# Patient Record
Sex: Female | Born: 1960 | Race: Black or African American | Hispanic: No | Marital: Married | Smoking: Never smoker
Health system: Southern US, Community
[De-identification: ages and names within clinical notes are randomized; demographics above are authoritative.]

## PROBLEM LIST (undated history)

## (undated) DIAGNOSIS — E669 Obesity, unspecified: Secondary | ICD-10-CM

## (undated) DIAGNOSIS — E785 Hyperlipidemia, unspecified: Secondary | ICD-10-CM

## (undated) DIAGNOSIS — Z8709 Personal history of other diseases of the respiratory system: Secondary | ICD-10-CM

## (undated) DIAGNOSIS — Z9889 Other specified postprocedural states: Secondary | ICD-10-CM

## (undated) DIAGNOSIS — R7303 Prediabetes: Secondary | ICD-10-CM

## (undated) DIAGNOSIS — K5900 Constipation, unspecified: Secondary | ICD-10-CM

## (undated) DIAGNOSIS — M2550 Pain in unspecified joint: Secondary | ICD-10-CM

## (undated) DIAGNOSIS — G4730 Sleep apnea, unspecified: Secondary | ICD-10-CM

## (undated) DIAGNOSIS — R112 Nausea with vomiting, unspecified: Secondary | ICD-10-CM

## (undated) DIAGNOSIS — M1990 Unspecified osteoarthritis, unspecified site: Secondary | ICD-10-CM

## (undated) DIAGNOSIS — M7989 Other specified soft tissue disorders: Secondary | ICD-10-CM

## (undated) DIAGNOSIS — Z860100 Personal history of colon polyps, unspecified: Secondary | ICD-10-CM

## (undated) DIAGNOSIS — T7840XA Allergy, unspecified, initial encounter: Secondary | ICD-10-CM

## (undated) DIAGNOSIS — M2540 Effusion, unspecified joint: Secondary | ICD-10-CM

## (undated) DIAGNOSIS — Z87442 Personal history of urinary calculi: Secondary | ICD-10-CM

## (undated) DIAGNOSIS — G479 Sleep disorder, unspecified: Secondary | ICD-10-CM

## (undated) DIAGNOSIS — J189 Pneumonia, unspecified organism: Secondary | ICD-10-CM

## (undated) DIAGNOSIS — K219 Gastro-esophageal reflux disease without esophagitis: Secondary | ICD-10-CM

## (undated) DIAGNOSIS — Z8719 Personal history of other diseases of the digestive system: Secondary | ICD-10-CM

## (undated) DIAGNOSIS — Z8619 Personal history of other infectious and parasitic diseases: Secondary | ICD-10-CM

## (undated) DIAGNOSIS — R062 Wheezing: Secondary | ICD-10-CM

## (undated) DIAGNOSIS — I10 Essential (primary) hypertension: Secondary | ICD-10-CM

## (undated) DIAGNOSIS — K760 Fatty (change of) liver, not elsewhere classified: Secondary | ICD-10-CM

## (undated) DIAGNOSIS — R0602 Shortness of breath: Secondary | ICD-10-CM

## (undated) DIAGNOSIS — Z91018 Allergy to other foods: Secondary | ICD-10-CM

## (undated) DIAGNOSIS — M549 Dorsalgia, unspecified: Secondary | ICD-10-CM

## (undated) HISTORY — DX: Wheezing: R06.2

## (undated) HISTORY — DX: Obesity, unspecified: E66.9

## (undated) HISTORY — PX: TUBAL LIGATION: SHX77

## (undated) HISTORY — DX: Essential (primary) hypertension: I10

## (undated) HISTORY — PX: OTHER SURGICAL HISTORY: SHX169

## (undated) HISTORY — DX: Other specified soft tissue disorders: M79.89

## (undated) HISTORY — PX: ESOPHAGOGASTRODUODENOSCOPY: SHX1529

## (undated) HISTORY — DX: Sleep disorder, unspecified: G47.9

## (undated) HISTORY — DX: Allergy, unspecified, initial encounter: T78.40XA

## (undated) HISTORY — DX: Fatty (change of) liver, not elsewhere classified: K76.0

## (undated) HISTORY — DX: Allergy to other foods: Z91.018

## (undated) HISTORY — DX: Dorsalgia, unspecified: M54.9

## (undated) HISTORY — DX: Prediabetes: R73.03

## (undated) HISTORY — PX: KNEE SURGERY: SHX244

## (undated) HISTORY — PX: BREAST EXCISIONAL BIOPSY: SUR124

## (undated) HISTORY — DX: Gastro-esophageal reflux disease without esophagitis: K21.9

## (undated) HISTORY — DX: Shortness of breath: R06.02

## (undated) HISTORY — DX: Hyperlipidemia, unspecified: E78.5

## (undated) HISTORY — PX: JOINT REPLACEMENT: SHX530

## (undated) HISTORY — PX: CARDIAC CATHETERIZATION: SHX172

## (undated) HISTORY — PX: BREAST SURGERY: SHX581

## (undated) HISTORY — DX: Unspecified osteoarthritis, unspecified site: M19.90

## (undated) HISTORY — PX: COLONOSCOPY: SHX174

## (undated) HISTORY — DX: Constipation, unspecified: K59.00

## (undated) HISTORY — DX: Sleep apnea, unspecified: G47.30

---

## 1997-12-15 ENCOUNTER — Ambulatory Visit (HOSPITAL_COMMUNITY): Admission: RE | Admit: 1997-12-15 | Discharge: 1997-12-15 | Payer: Self-pay | Admitting: Family Medicine

## 1999-01-27 ENCOUNTER — Encounter: Payer: Self-pay | Admitting: Emergency Medicine

## 1999-01-27 ENCOUNTER — Emergency Department (HOSPITAL_COMMUNITY): Admission: EM | Admit: 1999-01-27 | Discharge: 1999-01-27 | Payer: Self-pay | Admitting: Emergency Medicine

## 2001-05-10 ENCOUNTER — Ambulatory Visit: Admission: RE | Admit: 2001-05-10 | Discharge: 2001-05-10 | Payer: Self-pay | Admitting: Pulmonary Disease

## 2001-05-18 ENCOUNTER — Encounter: Payer: Self-pay | Admitting: Pulmonary Disease

## 2001-05-18 ENCOUNTER — Ambulatory Visit (HOSPITAL_COMMUNITY): Admission: RE | Admit: 2001-05-18 | Discharge: 2001-05-18 | Payer: Self-pay | Admitting: Pulmonary Disease

## 2001-06-08 ENCOUNTER — Ambulatory Visit (HOSPITAL_BASED_OUTPATIENT_CLINIC_OR_DEPARTMENT_OTHER): Admission: RE | Admit: 2001-06-08 | Discharge: 2001-06-08 | Payer: Self-pay | Admitting: Pulmonary Disease

## 2001-11-09 ENCOUNTER — Emergency Department (HOSPITAL_COMMUNITY): Admission: EM | Admit: 2001-11-09 | Discharge: 2001-11-09 | Payer: Self-pay | Admitting: Emergency Medicine

## 2003-01-17 ENCOUNTER — Encounter: Payer: Self-pay | Admitting: Obstetrics and Gynecology

## 2003-01-17 ENCOUNTER — Encounter: Admission: RE | Admit: 2003-01-17 | Discharge: 2003-01-17 | Payer: Self-pay | Admitting: Obstetrics and Gynecology

## 2003-01-28 ENCOUNTER — Other Ambulatory Visit: Admission: RE | Admit: 2003-01-28 | Discharge: 2003-01-28 | Payer: Self-pay | Admitting: Obstetrics and Gynecology

## 2004-01-26 ENCOUNTER — Encounter: Admission: RE | Admit: 2004-01-26 | Discharge: 2004-01-26 | Payer: Self-pay | Admitting: Obstetrics and Gynecology

## 2004-02-02 ENCOUNTER — Encounter: Payer: Self-pay | Admitting: Family Medicine

## 2004-02-17 ENCOUNTER — Other Ambulatory Visit: Admission: RE | Admit: 2004-02-17 | Discharge: 2004-02-17 | Payer: Self-pay | Admitting: Obstetrics and Gynecology

## 2004-05-17 ENCOUNTER — Ambulatory Visit: Payer: Self-pay | Admitting: Family Medicine

## 2005-01-11 ENCOUNTER — Ambulatory Visit: Payer: Self-pay | Admitting: Family Medicine

## 2005-01-25 ENCOUNTER — Ambulatory Visit: Payer: Self-pay | Admitting: Family Medicine

## 2005-02-01 ENCOUNTER — Encounter: Admission: RE | Admit: 2005-02-01 | Discharge: 2005-02-01 | Payer: Self-pay | Admitting: Family Medicine

## 2005-04-04 ENCOUNTER — Other Ambulatory Visit: Admission: RE | Admit: 2005-04-04 | Discharge: 2005-04-04 | Payer: Self-pay | Admitting: Obstetrics and Gynecology

## 2005-04-28 ENCOUNTER — Ambulatory Visit: Payer: Self-pay | Admitting: Family Medicine

## 2005-05-31 ENCOUNTER — Ambulatory Visit: Payer: Self-pay | Admitting: Family Medicine

## 2005-10-27 ENCOUNTER — Ambulatory Visit: Payer: Self-pay | Admitting: Internal Medicine

## 2005-11-17 ENCOUNTER — Ambulatory Visit: Payer: Self-pay | Admitting: Family Medicine

## 2006-02-23 ENCOUNTER — Encounter: Admission: RE | Admit: 2006-02-23 | Discharge: 2006-02-23 | Payer: Self-pay | Admitting: Family Medicine

## 2006-04-10 ENCOUNTER — Ambulatory Visit: Payer: Self-pay | Admitting: Family Medicine

## 2006-04-11 ENCOUNTER — Other Ambulatory Visit: Admission: RE | Admit: 2006-04-11 | Discharge: 2006-04-11 | Payer: Self-pay | Admitting: Obstetrics and Gynecology

## 2006-09-08 ENCOUNTER — Ambulatory Visit: Payer: Self-pay | Admitting: Family Medicine

## 2006-09-08 LAB — CONVERTED CEMR LAB
ALT: 37 units/L (ref 0–40)
Alkaline Phosphatase: 126 units/L — ABNORMAL HIGH (ref 39–117)
Basophils Relative: 0.4 % (ref 0.0–1.0)
Eosinophils Relative: 1.8 % (ref 0.0–5.0)
GFR calc Af Amer: 117 mL/min
Glucose, Bld: 98 mg/dL (ref 70–99)
HCT: 37.3 % (ref 36.0–46.0)
Lymphocytes Relative: 38.2 % (ref 12.0–46.0)
Neutro Abs: 3.2 10*3/uL (ref 1.4–7.7)
Neutrophils Relative %: 47.6 % (ref 43.0–77.0)
Platelets: 341 10*3/uL (ref 150–400)
Potassium: 4 meq/L (ref 3.5–5.1)
RBC: 4.24 M/uL (ref 3.87–5.11)
Sodium: 139 meq/L (ref 135–145)
Total Bilirubin: 0.6 mg/dL (ref 0.3–1.2)
Total Protein: 6.9 g/dL (ref 6.0–8.3)
Triglycerides: 61 mg/dL (ref 0–149)
VLDL: 12 mg/dL (ref 0–40)
WBC: 6.6 10*3/uL (ref 4.5–10.5)

## 2006-10-13 ENCOUNTER — Ambulatory Visit: Payer: Self-pay | Admitting: Family Medicine

## 2007-03-08 ENCOUNTER — Encounter: Admission: RE | Admit: 2007-03-08 | Discharge: 2007-03-08 | Payer: Self-pay | Admitting: Obstetrics and Gynecology

## 2007-03-19 ENCOUNTER — Ambulatory Visit: Payer: Self-pay | Admitting: Family Medicine

## 2007-03-19 DIAGNOSIS — I10 Essential (primary) hypertension: Secondary | ICD-10-CM | POA: Insufficient documentation

## 2007-03-19 DIAGNOSIS — IMO0001 Reserved for inherently not codable concepts without codable children: Secondary | ICD-10-CM | POA: Insufficient documentation

## 2007-03-19 DIAGNOSIS — K219 Gastro-esophageal reflux disease without esophagitis: Secondary | ICD-10-CM | POA: Insufficient documentation

## 2007-03-19 DIAGNOSIS — O99810 Abnormal glucose complicating pregnancy: Secondary | ICD-10-CM | POA: Insufficient documentation

## 2007-03-19 DIAGNOSIS — J302 Other seasonal allergic rhinitis: Secondary | ICD-10-CM | POA: Insufficient documentation

## 2007-03-19 DIAGNOSIS — J4520 Mild intermittent asthma, uncomplicated: Secondary | ICD-10-CM | POA: Insufficient documentation

## 2007-03-19 DIAGNOSIS — J4540 Moderate persistent asthma, uncomplicated: Secondary | ICD-10-CM | POA: Insufficient documentation

## 2007-03-19 DIAGNOSIS — K449 Diaphragmatic hernia without obstruction or gangrene: Secondary | ICD-10-CM | POA: Insufficient documentation

## 2007-03-19 DIAGNOSIS — J3089 Other allergic rhinitis: Secondary | ICD-10-CM

## 2007-03-21 ENCOUNTER — Telehealth (INDEPENDENT_AMBULATORY_CARE_PROVIDER_SITE_OTHER): Payer: Self-pay | Admitting: *Deleted

## 2007-03-27 ENCOUNTER — Telehealth (INDEPENDENT_AMBULATORY_CARE_PROVIDER_SITE_OTHER): Payer: Self-pay | Admitting: *Deleted

## 2007-03-27 ENCOUNTER — Ambulatory Visit: Payer: Self-pay | Admitting: Family Medicine

## 2007-03-27 DIAGNOSIS — T783XXA Angioneurotic edema, initial encounter: Secondary | ICD-10-CM | POA: Insufficient documentation

## 2007-03-28 ENCOUNTER — Telehealth: Payer: Self-pay | Admitting: Family Medicine

## 2007-04-16 ENCOUNTER — Other Ambulatory Visit: Admission: RE | Admit: 2007-04-16 | Discharge: 2007-04-16 | Payer: Self-pay | Admitting: Obstetrics and Gynecology

## 2007-04-30 ENCOUNTER — Telehealth: Payer: Self-pay | Admitting: Family Medicine

## 2007-08-20 ENCOUNTER — Telehealth: Payer: Self-pay | Admitting: Family Medicine

## 2007-08-20 ENCOUNTER — Ambulatory Visit: Payer: Self-pay | Admitting: Family Medicine

## 2007-08-29 ENCOUNTER — Telehealth: Payer: Self-pay | Admitting: Family Medicine

## 2007-09-04 ENCOUNTER — Ambulatory Visit: Payer: Self-pay | Admitting: Family Medicine

## 2007-09-05 ENCOUNTER — Ambulatory Visit: Payer: Self-pay | Admitting: Cardiology

## 2008-01-20 ENCOUNTER — Emergency Department (HOSPITAL_COMMUNITY): Admission: EM | Admit: 2008-01-20 | Discharge: 2008-01-20 | Payer: Self-pay | Admitting: Family Medicine

## 2008-03-21 ENCOUNTER — Encounter: Admission: RE | Admit: 2008-03-21 | Discharge: 2008-03-21 | Payer: Self-pay | Admitting: Obstetrics and Gynecology

## 2008-04-09 ENCOUNTER — Ambulatory Visit: Payer: Self-pay | Admitting: Family Medicine

## 2008-04-17 ENCOUNTER — Other Ambulatory Visit: Admission: RE | Admit: 2008-04-17 | Discharge: 2008-04-17 | Payer: Self-pay | Admitting: Obstetrics and Gynecology

## 2008-04-29 ENCOUNTER — Encounter: Payer: Self-pay | Admitting: Family Medicine

## 2008-04-29 ENCOUNTER — Telehealth (INDEPENDENT_AMBULATORY_CARE_PROVIDER_SITE_OTHER): Payer: Self-pay | Admitting: *Deleted

## 2008-04-30 ENCOUNTER — Ambulatory Visit: Payer: Self-pay | Admitting: Family Medicine

## 2008-05-07 ENCOUNTER — Telehealth: Payer: Self-pay | Admitting: Family Medicine

## 2008-05-08 ENCOUNTER — Ambulatory Visit: Payer: Self-pay | Admitting: Family Medicine

## 2008-05-09 ENCOUNTER — Encounter: Payer: Self-pay | Admitting: Family Medicine

## 2008-05-23 ENCOUNTER — Ambulatory Visit: Payer: Self-pay | Admitting: Family Medicine

## 2008-07-10 ENCOUNTER — Telehealth: Payer: Self-pay | Admitting: Family Medicine

## 2008-07-14 ENCOUNTER — Ambulatory Visit: Payer: Self-pay | Admitting: Family Medicine

## 2008-07-14 DIAGNOSIS — J45901 Unspecified asthma with (acute) exacerbation: Secondary | ICD-10-CM | POA: Insufficient documentation

## 2008-08-21 ENCOUNTER — Ambulatory Visit: Payer: Self-pay | Admitting: Internal Medicine

## 2008-08-21 LAB — CONVERTED CEMR LAB
Eosinophils Absolute: 0.1 10*3/uL (ref 0.0–0.7)
Eosinophils Relative: 1 % (ref 0.0–5.0)
HCT: 38.4 % (ref 36.0–46.0)
IgE (Immunoglobulin E), Serum: 374 intl units/mL — ABNORMAL HIGH (ref 0.0–180.0)
MCHC: 34.3 g/dL (ref 30.0–36.0)
MCV: 87.7 fL (ref 78.0–100.0)
Monocytes Absolute: 0.7 10*3/uL (ref 0.1–1.0)
Platelets: 329 10*3/uL (ref 150–400)
RDW: 13.3 % (ref 11.5–14.6)

## 2008-09-03 ENCOUNTER — Ambulatory Visit: Payer: Self-pay | Admitting: Internal Medicine

## 2008-09-24 ENCOUNTER — Telehealth (INDEPENDENT_AMBULATORY_CARE_PROVIDER_SITE_OTHER): Payer: Self-pay | Admitting: *Deleted

## 2008-12-23 ENCOUNTER — Ambulatory Visit: Payer: Self-pay | Admitting: Internal Medicine

## 2008-12-29 ENCOUNTER — Encounter: Payer: Self-pay | Admitting: Internal Medicine

## 2008-12-29 ENCOUNTER — Encounter (INDEPENDENT_AMBULATORY_CARE_PROVIDER_SITE_OTHER): Payer: Self-pay | Admitting: *Deleted

## 2009-01-11 ENCOUNTER — Encounter: Payer: Self-pay | Admitting: Internal Medicine

## 2009-01-12 ENCOUNTER — Telehealth (INDEPENDENT_AMBULATORY_CARE_PROVIDER_SITE_OTHER): Payer: Self-pay | Admitting: *Deleted

## 2009-01-14 ENCOUNTER — Ambulatory Visit: Payer: Self-pay | Admitting: Family Medicine

## 2009-01-14 DIAGNOSIS — J3489 Other specified disorders of nose and nasal sinuses: Secondary | ICD-10-CM | POA: Insufficient documentation

## 2009-01-14 DIAGNOSIS — R062 Wheezing: Secondary | ICD-10-CM | POA: Insufficient documentation

## 2009-01-27 ENCOUNTER — Telehealth: Payer: Self-pay | Admitting: Family Medicine

## 2009-01-29 ENCOUNTER — Ambulatory Visit: Payer: Self-pay | Admitting: Internal Medicine

## 2009-02-03 ENCOUNTER — Telehealth: Payer: Self-pay | Admitting: Internal Medicine

## 2009-04-03 LAB — CONVERTED CEMR LAB: Pap Smear: NORMAL

## 2009-04-03 LAB — HM MAMMOGRAPHY: HM Mammogram: NORMAL

## 2009-04-20 ENCOUNTER — Other Ambulatory Visit: Admission: RE | Admit: 2009-04-20 | Discharge: 2009-04-20 | Payer: Self-pay | Admitting: Obstetrics and Gynecology

## 2009-04-21 ENCOUNTER — Encounter: Admission: RE | Admit: 2009-04-21 | Discharge: 2009-04-21 | Payer: Self-pay | Admitting: Obstetrics and Gynecology

## 2009-05-27 ENCOUNTER — Ambulatory Visit: Payer: Self-pay | Admitting: Internal Medicine

## 2009-06-03 ENCOUNTER — Ambulatory Visit: Payer: Self-pay | Admitting: Internal Medicine

## 2009-06-15 ENCOUNTER — Telehealth (INDEPENDENT_AMBULATORY_CARE_PROVIDER_SITE_OTHER): Payer: Self-pay | Admitting: *Deleted

## 2009-06-29 ENCOUNTER — Ambulatory Visit: Payer: Self-pay | Admitting: Family Medicine

## 2009-09-15 ENCOUNTER — Telehealth (INDEPENDENT_AMBULATORY_CARE_PROVIDER_SITE_OTHER): Payer: Self-pay | Admitting: *Deleted

## 2009-09-21 ENCOUNTER — Ambulatory Visit: Payer: Self-pay | Admitting: Family Medicine

## 2009-11-24 ENCOUNTER — Ambulatory Visit: Payer: Self-pay | Admitting: Internal Medicine

## 2009-12-11 ENCOUNTER — Ambulatory Visit: Payer: Self-pay | Admitting: Internal Medicine

## 2009-12-11 DIAGNOSIS — M25569 Pain in unspecified knee: Secondary | ICD-10-CM

## 2009-12-11 DIAGNOSIS — G8929 Other chronic pain: Secondary | ICD-10-CM | POA: Insufficient documentation

## 2009-12-11 LAB — CONVERTED CEMR LAB
Basophils Absolute: 0.1 10*3/uL (ref 0.0–0.1)
CRP, High Sensitivity: 6.09 — ABNORMAL HIGH (ref 0.00–5.00)
Calcium: 9.8 mg/dL (ref 8.4–10.5)
GFR calc non Af Amer: 109.77 mL/min (ref >60)
Glucose, Bld: 79 mg/dL (ref 70–99)
Hemoglobin: 11.9 g/dL — ABNORMAL LOW (ref 12.0–15.0)
Lymphocytes Relative: 25.7 % (ref 12.0–46.0)
Monocytes Relative: 9.9 % (ref 3.0–12.0)
Neutro Abs: 5.8 10*3/uL (ref 1.4–7.7)
RDW: 15.1 % — ABNORMAL HIGH (ref 11.5–14.6)
Sed Rate: 43 mm/hr — ABNORMAL HIGH (ref 0–22)
Sodium: 141 meq/L (ref 135–145)

## 2009-12-13 ENCOUNTER — Encounter: Payer: Self-pay | Admitting: Internal Medicine

## 2009-12-16 ENCOUNTER — Telehealth: Payer: Self-pay | Admitting: Internal Medicine

## 2009-12-21 ENCOUNTER — Telehealth: Payer: Self-pay | Admitting: Family Medicine

## 2010-01-05 ENCOUNTER — Ambulatory Visit: Payer: Self-pay | Admitting: Family Medicine

## 2010-01-07 LAB — CONVERTED CEMR LAB
ALT: 36 units/L — ABNORMAL HIGH (ref 0–35)
Albumin: 4.4 g/dL (ref 3.5–5.2)
Chloride: 101 meq/L (ref 96–112)
HCT: 36 % (ref 36.0–46.0)
HDL: 70 mg/dL (ref >39)
LDL Cholesterol: 120 mg/dL — ABNORMAL HIGH (ref 0–99)
Lymphocytes Relative: 38 % (ref 12–46)
Lymphs Abs: 3.3 10*3/uL (ref 0.7–4.0)
Neutro Abs: 4.4 10*3/uL (ref 1.7–7.7)
Neutrophils Relative %: 50 % (ref 43–77)
Platelets: 381 10*3/uL (ref 150–400)
Potassium: 4.6 meq/L (ref 3.5–5.3)
Sodium: 139 meq/L (ref 135–145)
Total Protein: 7.5 g/dL (ref 6.0–8.3)
Triglycerides: 45 mg/dL (ref <150)
VLDL: 9 mg/dL (ref 0–40)
WBC: 8.7 10*3/uL (ref 4.0–10.5)

## 2010-01-14 ENCOUNTER — Ambulatory Visit: Payer: Self-pay | Admitting: Family Medicine

## 2010-01-19 LAB — CONVERTED CEMR LAB
CRP, High Sensitivity: 9.69 — ABNORMAL HIGH (ref 0.00–5.00)
Sed Rate: 58 mm/hr — ABNORMAL HIGH (ref 0–22)

## 2010-01-20 ENCOUNTER — Telehealth: Payer: Self-pay | Admitting: Family Medicine

## 2010-01-28 ENCOUNTER — Ambulatory Visit: Payer: Self-pay | Admitting: Family Medicine

## 2010-01-28 DIAGNOSIS — M2550 Pain in unspecified joint: Secondary | ICD-10-CM | POA: Insufficient documentation

## 2010-02-01 LAB — CONVERTED CEMR LAB
Cyclic Citrullin Peptide Ab: <2 units (ref 0.0–5.0)
ENA SM Ab Ser-aCnc: <1 (ref <30)

## 2010-04-21 ENCOUNTER — Other Ambulatory Visit: Admission: RE | Admit: 2010-04-21 | Discharge: 2010-04-21 | Payer: Self-pay | Admitting: Obstetrics and Gynecology

## 2010-04-29 ENCOUNTER — Encounter: Admission: RE | Admit: 2010-04-29 | Discharge: 2010-04-29 | Payer: Self-pay | Admitting: Obstetrics and Gynecology

## 2010-05-31 ENCOUNTER — Ambulatory Visit: Payer: Self-pay | Admitting: Internal Medicine

## 2010-06-07 ENCOUNTER — Telehealth: Payer: Self-pay | Admitting: Internal Medicine

## 2010-07-25 ENCOUNTER — Encounter: Payer: Self-pay | Admitting: Family Medicine

## 2010-08-05 NOTE — Assessment & Plan Note (Signed)
Summary: REFER FRM DR. TOWER FOR RIGHT LEG PAIN / LFW   Vital Signs:  Patient profile:   50 year old female Height:      60 inches Weight:      206.8 pounds BMI:     40.53 Temp:     98.7 degrees F oral Pulse rate:   76 / minute Pulse rhythm:   regular BP sitting:   110 / 70  (left arm) Cuff size:   large  Vitals Entered By: Benny Lennert CMA Duncan Dull) (January 14, 2010 2:51 PM)  History of Present Illness: Chief complaint Right leg pain  50 year old patient seen at the request of Dr. Milinda Antis for evaluation of right leg pain:  3 weeks or so and had a blood clot - she was worried that she had one at least, and was primarily having anterior compartmental and some lateral compartmental pain. She was ultimately seen by Dr. Yetta Barre, and had a normal  d-dimer.  She does not have a posterior leg pain. Last week, took some celebrex, which she did not like, and felt  unusual when she was taking this, however she is able to tolerate Aleve.  By the end of the day, anterior leg. Usually come home and alert. Lab in hosp. no specific instance or trauma or injury, the  this has been  eating worse over time her last several weeks. Is not acute 10/10 pain  Mother: Sarcoid  denies all other rheumatological history.  In family.  Allergies: 1)  ! Augmentin 2)  ! * Flu Vaccine  Past History:  Past medical, surgical, family and social histories (including risk factors) reviewed, and no changes noted (except as noted below).  Past Medical History: Reviewed history from 01/05/2010 and no changes required. Allergic rhinitis- POS SKIN TEST 09/03/08- triggered wheeze Asthma GERD Hypertension    Dr Richardson Dopp (was Dr Thomasena Edis)  Past Surgical History: Reviewed history from 08/21/2008 and no changes required. Right breast biopsy- benign Right ganglion cyst Tubal ligation cardiac cath- Neg  Family History: Reviewed history from 03/19/2007 and no changes required. Father: diabetes Mother: glaucoma, HTN,  sarcoidosis Siblings: 2 brothers, 1 sister  Social History: Reviewed history from 08/21/2008 and no changes required. 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   Review of Systems      See HPI General:  Denies chills, fatigue, and fever. MS:  Complains of muscle aches, muscle, cramps, and stiffness; denies joint pain and joint redness.  Physical Exam  General:  GEN: Well-developed,well-nourished,in no acute distress; alert,appropriate and cooperative throughout examination HEENT: Normocephalic and atraumatic without obvious abnormalities. No apparent alopecia or balding. Ears, externally no deformities PULM: Breathing comfortably in no respiratory distress PSYCH: Normally interactive. Cooperative during the interview. Pleasant. Friendly and conversant. Not anxious or depressed appearing. Normal, full affect.  Msk:  posterior right leg is completely nontender and non-tender in the posterior fossa.  Nontender with squeezing of her calf.  Anterior and lateral compartments of the right side are moderately tender to palpation. Patient has tenderness with  plantar flexion of the foot and  eversion, but only mildly so, and nontender in all other directional motions of the foot. Stable anterior drawer of the foot.  Knee has excellent range of motion without any fusion, LCL, MCL are intact. Negative Lachman and negative McMurray's test.  + HOP on right, unable to complete 1 jump TTP proximal tibia   Impression & Recommendations:  Problem # 1:  PAIN IN JOINT, LOWER LEG (ICD-719.46) this is an interesting case, right-sided anterior  compartment pain,, and also the patient has some pain in her  tibia,, but this is more anteriorly  and  more proximal than is  usually seen with stress fracture.  Laboratories were reviewed, significant for an elevated  alkaline phosphatase. Sedimentation rate and CRP are noted, and elevated. I would  like to repeat those.  given the proximal tibial pain, and  unusual presentation in the anterior and lateral compartments, and additionally  a positive hot tests, I am going to obtain an MRI  of her lower extremity, to evaluate her tibia and fibula.  Elevated alkaline phosphatase, will also help to differentiate if potential bone tumor is present, not otherwise seen on plain xrays.  cc: Dr Milinda Antis  The following medications were removed from the medication list:    Celebrex 200 Mg Caps (Celecoxib) .[name]... 1 by mouth once daily with food  Orders: Venipuncture (91478) Radiology Referral (Radiology) TLB-CRP-High Sensitivity (C-Reactive Protein) (86140-FCRP) TLB-Sedimentation Rate (ESR) (85652-ESR)  Complete Medication List: 1)  Lisinopril-hydrochlorothiazide 10-12.5 Mg Tabs (Lisinopril-hydrochlorothiazide) .[name].. 1 by mouth once daily 2)  Multivitamins Tabs (Multiple vitamin) .... One by mouth daily 3)  Claritin 10 Mg Tabs (Loratadine) .[name].. 1 by mouth once daily as needed 4)  Flonase 50 Mcg/act Susp (Fluticasone propionate) .... 2 sprays in each nostril once daily 5)  Proventil Hfa 108 (90 Base) Mcg/act Aers (Albuterol sulfate) .... 2 puffs up to every 4 hours as needed wheezing 6)  Advair Diskus 250-50 Mcg/dose Aepb (Fluticasone-salmeterol) .[name].. 1 puff and rinse mouth, twice daily 7)  Sudafed 30 Mg Tabs (Pseudoephedrine hcl) .... Otc as directed 8)  Singulair 10 Mg Tabs (Montelukast sodium) .[name].. 1 daily 9)  Allegra 60 Mg Tabs (Fexofenadine hcl) .... Otc as directed.  Patient Instructions: 1)  Referral Appointment Information 2)  Day/Date: 3)  Time: 4)  Place/MD: 5)  Address: 6)  Phone/Fax: 7)  Patient given appointment information. Information/Orders faxed/mailed.  8)  continue with ice, alleve, range of motion  Current Allergies (reviewed today): ! AUGMENTIN ! * FLU VACCINE

## 2010-08-05 NOTE — Assessment & Plan Note (Signed)
Summary: 31m reck/klw   Copy to:  [facility] Primary Provider/Referring Provider:  Pincus Sanes  CC:  Follow up visit-coughing now after office being dusted.[name]  History of Present Illness: May 27, 2009- Asthma, allergic rhinitis Right ear feels "funny". Otherwise doing well and only needing rescue inhaler about once/ week. No bad flare or infection since last here. Asks flu vax and asks a pretreatment to blunt reaction she expects with flu shot9 usually triggers her asthma). Asks scripts to [facility] pharmacy.  June 03, 2009- Asthma, allergic rhinitis The day of her flu shot she began to itch generally. Then over next day chest tighter and wheezing. Itching is gone. Denies fever, swollen glands, sore throat or GI.  June 03, 2009- Asthma, allergic rhinitis last week we gave flu vax and depo shot. Same day she began to itch, Then next day she had chest tightness and now cough and wheeze. Denies sore throat, purulent, glands, fever or GI.  Nov 24, 2009- Asthma, allergic rhinitis Some spring pollen rhinitis. Exposed to strong fragrance triggered some wheeze last month, but otw uses rescue inhaler only about once per month. Denies bothersome dry cough usually- no concerns with lisinopril. Aware of some cough at night "strangle" but not aware of reflux, and a remote sleep study was negative. Yesterday moving a refrigerator- dust made throat itch and chest tighten. Better today. Hasn't missed work since flu season.   Asthma History    Asthma Control Assessment:    Age range: 12+ years    Symptoms: 0-2 days/week    Nighttime Awakenings: 0-2/month    Interferes w/ normal activity: no limitations    SABA use (not for EIB): 0-2 days/week    Asthma Control Assessment: Well Controlled   Preventive Screening-Counseling & Management  Alcohol-Tobacco     Smoking Status: never  Current Medications (verified): 1)  Lisinopril-Hydrochlorothiazide 10-12.5 Mg  Tabs (Lisinopril-Hydrochlorothiazide) .[name]..  1 By Mouth Qd 2)  Multivitamins  Tabs (Multiple Vitamin) .... One By Mouth Daily 3)  Claritin 10 Mg Tabs (Loratadine) .[name].. 1 By Mouth Once Daily 4)  Flonase 50 Mcg/act Susp (Fluticasone Propionate) .... 2 Sprays in Each Nostril Once Daily 5)  Proventil Hfa 108 (90 Base) Mcg/act Aers (Albuterol Sulfate) .... 2 Puffs Up To Every 4 Hours As Needed Wheezing 6)  Advair Diskus 100-50 Mcg/dose Misc (Fluticasone-Salmeterol) .[name].. 1 Inhalation Two Times A Day 7)  Epipen 0.3 Mg/0.102ml (1:1000) Devi (Epinephrine Hcl (Anaphylaxis)) .... For Severe Allergic Reaction 8)  Sudafed 30 Mg Tabs (Pseudoephedrine Hcl) .... Otc As Directed 9)  Singulair 10 Mg Tabs (Montelukast Sodium) .[name].. 1 Daily  Allergies (verified): 1)  ! Augmentin 2)  ! 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   Risk Factors: Smoking Status: never (11/24/2009)  Review of Systems      See HPI       The patient complains of non-productive cough and nasal congestion/difficulty breathing through nose.  The patient denies shortness of breath with activity, shortness of breath at rest, productive cough, coughing up blood, chest pain, irregular heartbeats, acid heartburn, indigestion,  loss of appetite, weight change, abdominal pain, difficulty swallowing, sore throat, tooth/dental problems, headaches, sneezing, itching, depression, hand/feet swelling, joint stiffness or pain, rash, change in color of mucus, and fever.      Vital Signs:  Patient profile:   50 year old female Height:      60 inches Weight:      208 pounds BMI:     40.77 O2 Sat:      100 % on Room air Pulse rate:   78 / minute BP sitting:   118 / 70  (left arm) Cuff size:   regular  Vitals Entered By: Reynaldo Minium CMA (Nov 24, 2009 2:10 PM)  O2 Flow:  Room air  Physical Exam  Additional Exam:  General: A/Ox3; pleasant and cooperative, NAD, SKIN: no rash, lesions NODES: no lymphadenopathy HEENT: Powell/AT, EOM- WNL, Conjuctivae- clear, PERRLA, TM-WN, , Nose- sniffing, Throat- clear and wnl, Mallampati  III-IV NECK: Supple w/ fair ROM, JVD- none, normal carotid impulses w/o bruits Thyroid- CHEST: Clear to P&A, throat clearing HEART: RRR, no m/g/r heard ABDOMEN: Soft and nl;  GLO:VFIE, nl pulses, no edema  NEURO: Grossly intact to observation      Impression & Recommendations:  Problem # 1:  ASTHMA (ICD-493.90) Mild, intermittent and currently controlled. We discussed and will let her try Advair 250 for more stability Has never needed Epipen so we will drop it.  Problem # 2:  ALLERGIC RHINITIS (ICD-477.9)  We had comparative discussion of antihistamines. Her updated medication list for this problem includes:    Claritin 10 Mg Tabs (Loratadine) .[name]... 1 by mouth once daily    Flonase 50 Mcg/act Susp (Fluticasone propionate) .[name]... 2 sprays in each nostril once daily    Sudafed 30 Mg Tabs (Pseudoephedrine hcl) ..... Otc as directed  Medications Added to Medication List This Visit: 1)  Advair Diskus 250-50 Mcg/dose Aepb (Fluticasone-salmeterol) .[name].. 1 puff and rinse mouth, twice daily  Other Orders: Est. Patient Level IV (33295)  Patient Instructions: 1)  Please schedule a follow-up appointment in 6 months. 2)  Wear a dust mask if you are going to be exposed. 3)  Try allegra/ fexofenadine as a nonsedating antihistamine for allergic nose. 4)  Try sample/ script Advair 250/50, 1 puff and rinse mouth twice daily. if this  works better than your Advair 100, then stick with it. 5)  We can drop the Epipen. Prescriptions: ADVAIR DISKUS 250-50 MCG/DOSE AEPB (FLUTICASONE-SALMETEROL) 1 puff and rinse mouth, twice daily  #1 x prn   Entered and Authorized by:   Waymon Budge MD   Signed by:   Waymon Budge MD on 11/24/2009   Method used:   Print then Give to Patient   RxID:   [id number]   Prevention & Chronic Care Immunizations   Influenza vaccine: Not documented    Tetanus booster: 01/02/2001: Historical    Pneumococcal vaccine: Not documented  Other Screening   Pap smear: Done  (02/02/2004)    Mammogram: Normal  (02/23/2006)   Smoking status: never  (11/24/2009)  Lipids   Total Cholesterol: 199  (09/08/2006)   LDL: 130  (09/08/2006)   LDL Direct: Not documented   HDL: 56.8  (09/08/2006)   Triglycerides: 61  (09/08/2006)  Hypertension   Last Blood Pressure: 118 / 70  (11/24/2009)   Serum creatinine: 0.7  (09/08/2006)   Serum potassium 4.0  (09/08/2006)  Self-Management Support :    Hypertension self-management support: Not documented

## 2010-08-05 NOTE — Letter (Signed)
Summary: [employer]  [employer]   Imported By: Lester Scotland 11/06/2009 10:22:20  _____________________________________________________________________  External Attachment:    Type:   Image     Comment:   External Document

## 2010-08-05 NOTE — Assessment & Plan Note (Signed)
Summary: CPX W NO PAP/DLO   Vital Signs:  Patient profile:   50 year old female Height:      60 inches Weight:      207.25 pounds BMI:     40.62 Temp:     98.1 degrees F oral Pulse rate:   76 / minute Pulse rhythm:   regular BP sitting:   112 / 66  (left arm) Cuff size:   large  Vitals Entered By: Lewanda Rife LPN (January 05, 101 2:28 PM) CC: CPX GYN did pap and breast exam in 04/2009   History of Present Illness: here for health mt exam without gyn   wt is up 1 lb  was seen recently for pain in leg and had lab with slt elevated esr 43 and nl D dimer hb was 11.9 leg is still hurting  was told it was inflammation - worse by end of the day  front part of her leg in shin just throbs -- no trauma , no new exercise  does ride bike  back does not hurt   is hard to loose wt -- exercises and does nto eat much  is not exercising due to her leg  eating 1500 cal per day  she does eat breakfast   gyn visit recent pap-- was nl in oct  mam -- nl in oct / no breast lumps       Allergies: 1)  ! Augmentin 2)  ! 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   Risk Factors: Alcohol Use: 0 (12/11/2009)  Risk Factors: Smoking Status: never (12/11/2009)  Past Medical History: Allergic rhinitis- POS SKIN TEST 09/03/08- triggered wheeze Asthma GERD Hypertension    Dr Richardson Dopp (was Dr Thomasena Edis)  Review of Systems General:  Denies fatigue, loss of appetite, and malaise. Eyes:  Denies blurring and eye irritation. CV:  Denies chest pain or discomfort, palpitations, and swelling of  feet. Resp:  Denies cough, shortness of breath, and wheezing. GI:  Denies abdominal pain, change in bowel habits, indigestion, nausea, and vomiting. GU:  Denies abnormal vaginal bleeding, discharge, and dysuria. MS:  Complains of muscle aches; denies joint pain, joint redness, joint swelling, and muscle weakness. Derm:  Denies itching, lesion(s), poor wound healing, and rash. Neuro:  Denies headaches, numbness, and tingling. Psych:  Denies anxiety and depression. Endo:  Denies cold intolerance, excessive thirst, excessive urination, and heat intolerance. Heme:  Denies abnormal bruising and bleeding.  Physical Exam  General:  overweight but generally well appearing  Head:  normocephalic, atraumatic, and no abnormalities observed.   Eyes:  vision grossly intact, pupils equal, pupils round, and pupils reactive to light.   Ears:  R ear normal and L ear normal.  - scant cerumen  Nose:  no nasal discharge.   Mouth:  pharynx pink and moist.   Neck:  supple with full rom and no masses or thyromegally, no JVD or carotid bruit  Chest Wall:  No deformities, masses, or tenderness noted. Lungs:  Normal respiratory effort, chest expands symmetrically. Lungs are clear to auscultation, no crackles or wheezes. Heart:  Normal rate and regular rhythm. S1 and S2 normal without gallop, murmur, click, rub  or other extra sounds. Abdomen:  Bowel sounds positive,abdomen soft and non-tender without masses, organomegaly or hernias noted. no renal bruits  Msk:  No deformity or scoliosis noted of thoracic or lumbar spine.  no acute joint changes  R ant shin is tender without swelling/ redness or mass felt (no palp cords or heat) worse on full leg flex - nl hip rot and knee flex  nl rom LS with no bony tenderness   Pulses:  R and L carotid,radial,femoral,dorsalis pedis and posterior tibial pulses are full and equal bilaterally Extremities:  No clubbing, cyanosis, edema, or deformity noted with normal full range of  motion of all joints.   Neurologic:  sensation intact to light touch, gait normal, and DTRs symmetrical and normal.   Skin:  Intact without suspicious lesions or rashes Cervical Nodes:  No lymphadenopathy noted Inguinal Nodes:  No significant adenopathy Psych:  normal affect, talkative and pleasant    Impression & Recommendations:  Problem # 1:  HEALTH MAINTENANCE EXAM (ICD-V70.0) Assessment Comment Only reviewed health habits including diet, exercise and skin cancer prevention reviewed health maintenance list and family history disc imp of wt loss it may take her 1200 calories and exercise 5 d per week for wt loss ultimately- will follow pt will continue yearly gyn visits for breast and pelvic exam  Orders: Venipuncture ([address]) T-Basic Metabolic Panel ([id number]) T-Lipid Profile ([id number]) T-CBC w/Diff ([id number]) T-TSH ([id number]) Specimen Handling (24401) T-Hepatic Function ([id number])  Problem # 2:  PAIN IN JOINT, LOWER LEG (ICD-719.46) Assessment: Deteriorated pain over R ant shin without back pain/ swelling or other findings will try different anti inflam - celebrex and update  sched appt with sports med Dr Patsy Lager as well  recommend heat and stretches The following medications were removed from the medication list:    Celebrex 200 Mg Caps (Celecoxib) ..... One by mouth once daily as needed for pain    Aleve 220 Mg Tabs (Naproxen sodium) ..... Otc as directed.    Ibuprofen 200 Mg Tabs (Ibuprofen) ..... Otc as directed. Her updated medication list for this problem includes:    Celebrex 200 Mg Caps (Celecoxib) .[name]... 1 by mouth once daily with food  Problem # 3:  ESSENTIAL HYPERTENSION (ICD-401.9) Assessment: Unchanged  this is well controlled with ace/ hct and no changes lab today disc healthy diet (low simple sugar/ choose complex carbs/ low sat fat) diet and exercise in detail  Her updated medication list for this problem includes:     Lisinopril-hydrochlorothiazide 10-12.5 Mg Tabs (Lisinopril-hydrochlorothiazide) .[name]... 1 by mouth once daily  Orders: Venipuncture (03474) T-Basic Metabolic Panel [phone]) T-Lipid Profile [phone]) T-CBC w/Diff [phone]) T-TSH [phone]) Specimen Handling (22025) T-Hepatic Function [id number]) Prescription Created Electronically [phone])  BP today: 112/66 Prior BP: 130/80 (12/11/2009)  Prior 10 Yr Risk Heart Disease: 4 % (12/11/2009)  Labs Reviewed: K+: 4.2 (12/11/2009) Creat: : 0.7 (12/11/2009)   Chol: 199 (09/08/2006)   HDL: 56.8 (09/08/2006)   LDL: 130 (09/08/2006)   TG: 61 (09/08/2006)  Complete Medication List: 1)  Lisinopril-hydrochlorothiazide 10-12.5 Mg Tabs (Lisinopril-hydrochlorothiazide) .[name].. 1 by mouth once daily 2)  Multivitamins Tabs (Multiple vitamin) .... One by mouth daily 3)  Claritin 10 Mg Tabs (Loratadine) .[name].. 1 by mouth once daily as needed 4)  Flonase 50 Mcg/act Susp (Fluticasone propionate) .... 2 sprays in each nostril once daily 5)  Proventil Hfa 108 (90 Base) Mcg/act Aers (Albuterol sulfate) .... 2 puffs up to every 4 hours as needed wheezing 6)  Advair Diskus 250-50 Mcg/dose Aepb (  Fluticasone-salmeterol) .[name].. 1 puff and rinse mouth, twice daily 7)  Sudafed 30 Mg Tabs (Pseudoephedrine hcl) .... Otc as directed 8)  Singulair 10 Mg Tabs (Montelukast sodium) .[name].. 1 daily 9)  Allegra 60 Mg Tabs (Fexofenadine hcl) .... Otc as directed. 10)  Celebrex 200 Mg Caps (Celecoxib) .[name].. 1 by mouth once daily with food  Patient Instructions: 1)  you can raise your HDL (good cholesterol) by increasing exercise and eating omega 3 fatty acid supplement like fish oil or flax seed oil over the counter 2)  you can lower LDL (bad cholesterol) by limiting saturated fats in diet like red meat, fried foods, egg yolks, fatty breakfast meats, high fat dairy products and shellfish  3)  try eating 1200 calories per day and exercise 5 days per week for weight loss    4)  we will schedule appt with Dr Patsy Lager at check out for leg pain 5)  labs today 6)  try the celebrex 1 pill with food once daily until then  7)  stop aleve/ advil / aspirin while on this medicine  Prescriptions: LISINOPRIL-HYDROCHLOROTHIAZIDE 10-12.5 MG  TABS (LISINOPRIL-HYDROCHLOROTHIAZIDE) 1 by mouth once daily  #30 x 11   Entered and Authorized by:   Judith Part MD   Signed by:   Judith Part MD on 01/05/2010   Method used:   Electronically to        [facility]* (retail)       [address]       [place], [place]  [address]       Ph: [phone]       Fax: [phone]   RxID:   [id number]   Current Allergies (reviewed today): ! AUGMENTIN ! * FLU VACCINE   Preventive Care Screening  Mammogram:    Date:  04/03/2009    Results:  normal   Pap Smear:    Date:  04/03/2009    Results:  normal

## 2010-08-05 NOTE — Letter (Signed)
Summary: Results Follow-up Letter  [facility]  [address] [place], [place] [address]   Phone: [phone]  Fax: [phone]    12/13/2009  [address] [place], [place]  [address]  Dear Katherine Tanner,   The following are the results of your recent test(s):  Test       Result     Leg Xray       normal Blood clot test     negative CBC         mild anemia Liver/kidney     normal Inflammation test     mildly elevated   _________________________________________________________  Please call for an appointment soon _________________________________________________________ _________________________________________________________ _________________________________________________________  Sincerely,  Sanda Linger MD [facility]

## 2010-08-05 NOTE — Progress Notes (Signed)
Summary: SOB/dry cough  Phone Note Call from Patient Call back at Home Phone [phone]   Caller: Patient Call For: Dusty Raczkowski Reason for Call: Talk to Nurse Summary of Call: Pt c/o increased SOB and tightness in chest and back starting 4 am this morning, dry cough, and is using rescue inhaler more today. [facility] Initial call taken by: Zackery Barefoot CMA,  June 07, 2010 11:40 AM  Follow-up for Phone Call        Spoke with pt.  She states that she woke up this am feeling very tightness in her chest and also has increased SOB.  She also has had dry cough x 2 days.  She states that she has already used proventil x 2 this am without much help.   Pls advise thanks! allergic to fluvax and augmentin Follow-up by: Vernie Murders,  June 07, 2010 11:50 AM  Additional Follow-up for Phone Call Additional follow up Details #1::        Per CDY-okay to give Prednisone 10mg  #20 take 4 by mouth x 2 days, 3 by mouth x 2 days, 2 by mouth x 2 days, 1 by mouth x 2 days no refills.Reynaldo Minium CMA  June 07, 2010 12:13 PM   rx sent. pt aware.[name] CMA  June 07, 2010 12:16 PM     New/Updated Medications: PREDNISONE 10 MG TABS (PREDNISONE) take 4 by mouth x 2 days, 3 by mouth x 2 days, 2 by mouth x 2 days, 1 by mouth x 2 days Prescriptions: PREDNISONE 10 MG TABS (PREDNISONE) take 4 by mouth x 2 days, 3 by mouth x 2 days, 2 by mouth x 2 days, 1 by mouth x 2 days  #20 x 0   Entered by:   [name] CMA   Authorized by:   Waymon Budge MD   Signed by:   [name] CMA on 06/07/2010   Method used:   Electronically to        [facility]* (retail)       [address]       [place], [place]  [address]       Ph: [phone]       Fax: [phone]   RxID:   [id number]

## 2010-08-05 NOTE — Assessment & Plan Note (Signed)
Summary: 2-3 WEEK FOLLOW UP/RBH   Vital Signs:  Patient profile:   51 year old female Height:      60 inches Weight:      205.8 pounds BMI:     40.34 O2 Sat:      100 % on Room air Temp:     98.9 degrees F oral Pulse rate:   76 / minute Pulse rhythm:   regular Resp:     16 per minute BP sitting:   120 / 70  (left arm) Cuff size:   large  Vitals Entered By: Benny Lennert CMA Duncan Dull) (January 28, 2010 3:42 PM)  O2 Flow:  Room air  History of Present Illness: Chief complaint 2-3 week follow up leg pain  50 year old female:  patient is here in followup for RIGHT anterior leg pain. She is somewhat better compared to prior office visit. Her CRP and sed rate, remained elevated. But she is doing much better compared on the prior examination. She also is having some associated foot and ankle pain, and some knee pain. At this point, compared to her prior examination, her pain directly the tibia is decreased.  She hasn't had any additional trauma or incident. She does still stand and walk on hard floors much of the day.   She raises the question and concern about potential rheumatological disorders.  PRIOR OV:  50 year old patient seen at the request of Dr. Milinda Antis for evaluation of right leg pain:  3 weeks or so and had a blood clot - she was worried that she had one at least, and was primarily having anterior compartmental and some lateral compartmental pain. She was ultimately seen by Dr. Yetta Barre, and had a normal  d-dimer.  She does not have a posterior leg pain. Last week, took some celebrex, which she did not like, and felt  unusual when she was taking this, however she is able to tolerate Aleve.  By the end of the day, anterior leg. Usually come home and alert. Lab in hosp. no specific instance or trauma or injury, the  this has been  eating worse over time her last several weeks. Is not acute 10/10 pain  Mother: Sarcoid  denies all other rheumatological history.  In  family.  Allergies: 1)  ! Augmentin 2)  ! * Flu Vaccine  Past History:  Past medical, surgical, family and social histories (including risk factors) reviewed, and no changes noted (except as noted below).  Past Medical History: Reviewed history from 01/05/2010 and no changes required. Allergic rhinitis- POS SKIN TEST 09/03/08- triggered wheeze Asthma GERD Hypertension    Dr Richardson Dopp (was Dr Thomasena Edis)  Past Surgical History: Reviewed history from 08/21/2008 and no changes required. Right breast biopsy- benign Right ganglion cyst Tubal ligation cardiac cath- Neg  Family History: Reviewed history from 03/19/2007 and no changes required. Father: diabetes Mother: glaucoma, HTN, sarcoidosis Siblings: 2 brothers, 1 sister  Social History: Reviewed history from 08/21/2008 and no changes required. 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   Review of Systems      See HPI MS:  See HPI. Neuro:  Denies falling down, tingling, and weakness.  Physical Exam  General:  GEN: Well-developed,well-nourished,in no acute distress; alert,appropriate and cooperative throughout examination HEENT: Normocephalic and atraumatic without obvious abnormalities. No apparent alopecia or balding. Ears, externally no deformities PULM: Breathing comfortably in no respiratory distress PSYCH: Normally interactive. Cooperative during the interview. Pleasant. Friendly and  conversant. Not anxious or depressed appearing. Normal, full affect.  Msk:  posterior right leg is completely nontender and non-tender in the posterior fossa.  Nontender with squeezing of her calf.  Anterior and lateral compartments of the right side are mildly tender to palpation. Patient has tenderness with  plantar flexion of the foot and  eversion, but only mildly so, and nontender in all other directional motions of the foot. Stable anterior drawer of the foot.  Knee has  excellent range of motion without any fusion, LCL, MCL are intact. Negative Lachman and negative McMurray's test. flexion pain she is mildly  tender but not grossly positive  + HOP on right,  but she is able to complete 10 jumps easily, and she is able to complete 10 gypsum 2 feet without any difficulty.  foot and ankle are grossly unremarkable. Nontender at the malleoli, nontender at the navicular, cuboid, metatarsals, forefoot, midfoot.   Impression & Recommendations:  Problem # 1:  POLYARTHRITIS (ICD-719.49) given polyarthropathy and multiple complaints, is reasonable to evaluate for rheumatological disorders  Orders: Venipuncture (29562) T- Specimen Handling (13086) T-ENA Panel ([id number]) T- * Misc. Laboratory test [phone]) T- * Misc. Laboratory test [phone]) T-Rheumatoid Factor [phone])  Problem # 2:  PAIN IN JOINT, LOWER LEG (ICD-719.46)  at this point, I think that this is mostly anterior lateral compartmental muscular over stress due to work. They did tibial stress fracture is less likely, exam is improved. Since she is improved clinically, I believe that this can be followed.  I think much of this is due to gait abnormality, obesity, foot breakdown at the longitudinal arch, hind foot breakdown, over stress of the peroneal tendons, and anterior tibialis stress as well. We discussed her footwear, getting new shoes, getting some Spencos.   I would do all this before any significant intervention.  patient would be a good custom orthotics candidate as well.  >25 minutes spent in face to face time with patient, >50% spent in counselling or coordination of care   Complete Medication List: 1)  Lisinopril-hydrochlorothiazide 10-12.5 Mg Tabs (Lisinopril-hydrochlorothiazide) .[name].. 1 by mouth once daily 2)  Multivitamins Tabs (Multiple vitamin) .... One by mouth daily 3)  Claritin 10 Mg Tabs (Loratadine) .[name].. 1 by mouth once daily as needed 4)  Flonase 50 Mcg/act Susp  (Fluticasone propionate) .... 2 sprays in each nostril once daily 5)  Proventil Hfa 108 (90 Base) Mcg/act Aers (Albuterol sulfate) .... 2 puffs up to every 4 hours as needed wheezing 6)  Advair Diskus 250-50 Mcg/dose Aepb (Fluticasone-salmeterol) .[name].. 1 puff and rinse mouth, twice daily 7)  Sudafed 30 Mg Tabs (Pseudoephedrine hcl) .... Otc as directed 8)  Singulair 10 Mg Tabs (Montelukast sodium) .[name].. 1 daily 9)  Allegra 60 Mg Tabs (Fexofenadine hcl) .... Otc as directed.  Patient Instructions: 1)  Excellent Over the Counter Orthotics: 2)  Hapad: available at www.hapad.com 3)  SPENCO: Available at some sports stores or www.amazon.com 4)  SHOES: Danskos, Merrells, Keens, Clarks - good arch support, want minimal bendability 5)  [facility] in [place]: excellent staff, shoe selection, OTC orthotics 6)  Shoes: Birkenstock shoes, [employer] 7)  ***[employer], [address] [place], [place]***  8)  Posterior Tib and arch rehab 9)  Begin with easy walking, heel, toe and backwards 10)  Calf raises on a step - Pidgeon Toes, with toes turned inward 11)  Towel "scrunch ups" 12)  Try to do most days of the week 13)  If pain  persists at 3 sets of 30 - add backpack with 5 lbs 14)  Increase by 5 lbs per week to max of 30 lbs   Current Allergies (reviewed today): ! AUGMENTIN ! * FLU VACCINE

## 2010-08-05 NOTE — Progress Notes (Signed)
Summary: ? follow up and substitute for Celebrex  Phone Note Call from Patient Call back at Home Phone [phone] Call back at Work Phone [phone] Call back at Work # til 1:30, then Home after 2:30   Caller: Patient Call For: Dr. Hetty Ely Summary of Call: 1. The patient saw Dr. Yetta Barre at [facility] last week because there were no appts. here.  When she received her results, she was told to follow up with her PCP.  She has an appointment on July 5th with Dr. Milinda Antis.  Should she be seen sooner than that?   2. She says she was given Celebrex but that she has asthma and a lot of allergies and she is afraid to take it.  Is there anything else she can take? Initial call taken by: Delilah Shan CMA Duncan Dull),  December 21, 2009 9:13 AM  Follow-up for Phone Call        Any NSID will carry same side effect/risk profile. Other category of medicine for discomfort would ber Tylenol and she could take 500mg  2 tabs three times a day regularly without risk per her history in the chart. Shaune Leeks MD  December 21, 2009 1:51 PM   Patient Advised. Lugene Fuquay CMA Duncan Dull)  December 21, 2009 2:21 PM

## 2010-08-05 NOTE — Assessment & Plan Note (Signed)
Summary: DR Milinda Antis PT/OK PER [facility]--LOWER LEG PAIN-STC   Vital Signs:  Patient profile:   50 year old female Height:      60 inches Weight:      206.50 pounds BMI:     40.48 O2 Sat:      97 % on Room air Temp:     98.1 degrees F oral Pulse rate:   84 / minute Pulse rhythm:   regular Resp:     16 per minute BP sitting:   130 / 80  (left arm) Cuff size:   large  Vitals Entered By: Rock Nephew CMA (December 11, 2009 2:31 PM)  Nutrition Counseling: Patient's BMI is greater than 25 and therefore counseled on weight management options.  O2 Flow:  Room air CC: R leg pain x 2wks, Hypertension Management Is Patient Diabetic? No Pain Assessment Patient in pain? yes     Location: R leg Intensity: 6 Type: throbbing Onset of pain  Constant   Primary Care Provider:  Pincus Sanes  CC:  R leg pain x 2wks and Hypertension Management.  History of Present Illness: New to me she complains of right lower leg pain for 2 weeks and she is worried that she may have a blood clot in her leg though it is not swollen. There has not been an injury or trauma. The pain is throbbing in nature and 6/10. She can bear weight on her leg without difficulty.  Hypertension History:      She denies headache, chest pain, palpitations, dyspnea with exertion, orthopnea, PND, peripheral edema, visual symptoms, neurologic problems, syncope, and side effects from treatment.  She notes no problems with any antihypertensive medication side effects.        Positive major cardiovascular risk factors include hypertension.  Negative major cardiovascular risk factors include female age less than 63 years old, no history of diabetes or hyperlipidemia, negative family history for ischemic heart disease, and non-tobacco-user status.        Further assessment for target organ damage reveals no history of ASHD, cardiac end-organ damage (CHF/LVH), stroke/TIA, peripheral vascular disease, renal insufficiency, or hypertensive retinopathy.      Preventive Screening-Counseling & Management  Alcohol-Tobacco     Alcohol drinks/day: 0     Smoking Status: never  Hep-HIV-STD-Contraception     Hepatitis Risk: no risk noted     HIV Risk: no risk noted     STD Risk: no risk noted  Clinical Review Panels:  Diabetes Management   Creatinine:  0.7 (09/08/2006)  CBC   WBC:  8.7 (08/21/2008)   RBC:  4.38 (08/21/2008)   Hgb:  13.2 (08/21/2008)   Hct:  38.4 (08/21/2008)   Platelets:  329 (08/21/2008)   MCV  87.7 (08/21/2008)   MCHC  34.3 (08/21/2008)   RDW  13.3 (08/21/2008)   PMN:  54.9 (08/21/2008)   Lymphs:  35.0 (08/21/2008)   Monos:  8.4 (08/21/2008)   Eosinophils:  1.0 (08/21/2008)   Basophil:  0.7 (08/21/2008)  Complete Metabolic Panel   Glucose:  98 (09/08/2006)   Sodium:  139 (09/08/2006)   Potassium:  4.0 (09/08/2006)   Chloride:  103 (09/08/2006)   CO2:  31 (09/08/2006)   BUN:  11 (09/08/2006)   Creatinine:  0.7 (09/08/2006)   Albumin:  3.6 (09/08/2006)   Total Protein:  6.9 (09/08/2006)   Calcium:  9.5 (09/08/2006)   Total Bili:  0.6 (09/08/2006)   Alk Phos:  126 (09/08/2006)   SGPT (ALT):  37 (09/08/2006)  SGOT (AST):  27 (09/08/2006)   Medications Prior to Update: 1)  Lisinopril-Hydrochlorothiazide 10-12.5 Mg  Tabs (Lisinopril-Hydrochlorothiazide) .[name].. 1 By Mouth Qd 2)  Multivitamins  Tabs (Multiple Vitamin) .... One By Mouth Daily 3)  Claritin 10 Mg Tabs (Loratadine) .[name].. 1 By Mouth Once Daily 4)  Flonase 50 Mcg/act Susp (Fluticasone Propionate) .... 2 Sprays in Each Nostril Once Daily 5)  Proventil Hfa 108 (90 Base) Mcg/act Aers (Albuterol Sulfate) .... 2 Puffs Up To Every 4 Hours As Needed Wheezing 6)  Advair Diskus 100-50 Mcg/dose Misc (Fluticasone-Salmeterol) .[name].. 1 Inhalation Two Times A Day 7)  Advair Diskus 250-50 Mcg/dose Aepb (Fluticasone-Salmeterol) .[name].. 1 Puff and Rinse Mouth, Twice Daily 8)  Sudafed 30 Mg Tabs (Pseudoephedrine Hcl) .... Otc As Directed 9)  Singulair 10 Mg Tabs  (Montelukast Sodium) .[name].. 1 Daily  Current Medications (verified): 1)  Lisinopril-Hydrochlorothiazide 10-12.5 Mg  Tabs (Lisinopril-Hydrochlorothiazide) .[name].. 1 By Mouth Qd 2)  Multivitamins  Tabs (Multiple Vitamin) .... One By Mouth Daily 3)  Claritin 10 Mg Tabs (Loratadine) .[name].. 1 By Mouth Once Daily 4)  Flonase 50 Mcg/act Susp (Fluticasone Propionate) .... 2 Sprays in Each Nostril Once Daily 5)  Proventil Hfa 108 (90 Base) Mcg/act Aers (Albuterol Sulfate) .... 2 Puffs Up To Every 4 Hours As Needed Wheezing 6)  Advair Diskus 100-50 Mcg/dose Misc (Fluticasone-Salmeterol) .[name].. 1 Inhalation Two Times A Day 7)  Advair Diskus 250-50 Mcg/dose Aepb (Fluticasone-Salmeterol) .[name].. 1 Puff and Rinse Mouth, Twice Daily 8)  Sudafed 30 Mg Tabs (Pseudoephedrine Hcl) .... Otc As Directed 9)  Singulair 10 Mg Tabs (Montelukast Sodium) .[name].. 1 Daily 10)  Celebrex 200 Mg Caps (Celecoxib) .... One By Mouth Once Daily As Needed For Pain  Allergies (verified): 1)  ! Augmentin 2)  ! 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   Risk Factors: Alcohol Use: 0 (12/11/2009)  Risk Factors: Smoking Status: never (12/11/2009)  Family History: Reviewed history from 03/19/2007 and no changes required. Father: diabetes Mother: glaucoma, HTN, sarcoidosis Siblings: 2 brothers, 1 sister  Social History: Reviewed history from 08/21/2008 and no changes required. 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  Hepatitis Risk:  no risk noted HIV Risk:  no risk noted STD Risk:  no risk noted  Review of Systems  The patient denies anorexia, fever, weight loss, chest pain, syncope, dyspnea on exertion, peripheral edema, prolonged cough, headaches, hemoptysis, abdominal pain, difficulty walking, enlarged lymph nodes, and angioedema.   MS:  Complains of joint pain, muscle aches, and stiffness; denies joint redness, joint swelling, loss of strength, low back pain, cramps, muscle weakness, and thoracic pain.  Physical Exam  General:  alert, well-developed, well-nourished, well-hydrated, appropriate dress, normal appearance, healthy-appearing, and cooperative to examination.   Head:  normocephalic, atraumatic, no abnormalities observed, and no abnormalities palpated.   Eyes:  vision grossly intact and no injection.   Mouth:  Oral mucosa and oropharynx without lesions or exudates.  Teeth in good repair. Neck:  supple, full ROM, no masses, no thyromegaly, no JVD, normal carotid upstroke,  no carotid bruits, and no cervical lymphadenopathy.   Lungs:  normal respiratory effort, no intercostal retractions, no accessory muscle use, normal breath sounds, no dullness, no fremitus, no crackles, and no wheezes.   Heart:  normal rate, regular rhythm, no murmur, no gallop, no rub, and no JVD.   Abdomen:  soft, non-tender, normal bowel sounds, no distention, no masses, no guarding, no rigidity, no rebound tenderness, no hepatomegaly, and no splenomegaly.   Msk:  normal ROM, no joint tenderness, no joint swelling, no joint warmth, no redness over joints, no joint deformities, no joint instability, no crepitation, and no muscle atrophy.   Pulses:  R and L carotid,radial,femoral,dorsalis pedis and posterior tibial pulses are full and equal bilaterally Extremities:  No clubbing, cyanosis, edema, or deformity noted with normal full range of  motion of all joints.   Neurologic:  No cranial nerve deficits noted. Station and gait are normal. Plantar reflexes are down-going bilaterally. DTRs are symmetrical throughout. Sensory, motor and coordinative functions appear intact. Skin:  turgor normal, color normal, no rashes, no suspicious lesions, no ecchymoses, no petechiae, no purpura, no ulcerations, and no edema.   Cervical Nodes:  No lymphadenopathy noted Axillary Nodes:  No palpable lymphadenopathy Psych:  Cognition and judgment appear intact. Alert and cooperative with normal attention span and concentration. No apparent delusions, illusions, hallucinations   Impression & Recommendations:  Problem # 1:  PAIN IN JOINT, LOWER LEG (ICD-719.46) Assessment New will check a d-dimer to r/o dvt and look at inflammatory markers. also will  look at a plain film for occult fracture, etc. Her updated medication list for this problem includes:    Celebrex 200 Mg Caps (Celecoxib) ..... One by mouth once daily as needed for pain  Orders: Venipuncture (04540) T-D-Dimer Fibrin Derivatives Quantitive [phone]) T-Tib/Fib Right (73590TC) TLB-BMP (Basic Metabolic Panel-BMET) (80048-METABOL) TLB-CBC Platelet - w/Differential (85025-CBCD) TLB-CRP-High Sensitivity (C-Reactive Protein) (86140-FCRP) TLB-Sedimentation Rate (ESR) (85652-ESR)  Problem # 2:  ESSENTIAL HYPERTENSION (ICD-401.9) Assessment: Improved  Her updated medication list for this problem includes:    Lisinopril-hydrochlorothiazide 10-12.5 Mg Tabs (Lisinopril-hydrochlorothiazide) .[name]... 1 by mouth qd  Orders: Venipuncture (95621) T-D-Dimer Fibrin Derivatives Quantitive [phone]) TLB-BMP (Basic Metabolic Panel-BMET) (80048-METABOL) TLB-CBC Platelet - w/Differential (85025-CBCD) TLB-CRP-High Sensitivity (C-Reactive Protein) (86140-FCRP) TLB-Sedimentation Rate (ESR) (85652-ESR)  BP today: 130/80 Prior BP: 118/70 (11/24/2009)  Labs Reviewed: K+: 4.0  (09/08/2006) Creat: : 0.7 (09/08/2006)   Chol: 199 (09/08/2006)   HDL: 56.8 (09/08/2006)   LDL: 130 (09/08/2006)   TG: 61 (09/08/2006)  Complete Medication List: 1)  Lisinopril-hydrochlorothiazide 10-12.5 Mg Tabs (Lisinopril-hydrochlorothiazide) .[name].. 1 by mouth qd 2)  Multivitamins Tabs (Multiple vitamin) .... One by mouth daily 3)  Claritin 10 Mg Tabs (Loratadine) .[name].. 1 by mouth once daily 4)  Flonase 50 Mcg/act Susp (Fluticasone propionate) .... 2 sprays in each nostril once daily 5)  Proventil Hfa 108 (90 Base) Mcg/act Aers (Albuterol sulfate) .... 2 puffs up to every 4 hours as needed wheezing 6)  Advair Diskus 100-50 Mcg/dose Misc (Fluticasone-salmeterol) .[name].. 1 inhalation two times a day 7)  Advair Diskus 250-50 Mcg/dose Aepb (Fluticasone-salmeterol) .[name].. 1 puff and rinse mouth, twice daily 8)  Sudafed 30 Mg Tabs (Pseudoephedrine hcl) .... Otc as directed 9)  Singulair 10 Mg Tabs (Montelukast sodium) .[name].. 1 daily 10)  Celebrex 200 Mg Caps (Celecoxib) .... One by mouth once daily as needed for pain  Hypertension Assessment/Plan:      The patient's hypertensive risk group is category A: No risk factors and no target organ  damage.  Her calculated 10 year risk of coronary heart disease is 4 %.  Today's blood pressure is 130/80.  Her blood pressure goal is < 140/90.  Patient Instructions: 1)  Please schedule a follow-up appointment in 2 weeks. 2)  Check your Blood Pressure regularly. If it is above 140/90: you should make an appointment. Prescriptions: CELEBREX 200 MG CAPS (CELECOXIB) One by mouth once daily as needed for pain  #30 x 0   Entered and Authorized by:   Etta Grandchild MD   Signed by:   Etta Grandchild MD on 12/11/2009   Method used:   Samples Given   RxID:   [id number]

## 2010-08-05 NOTE — Progress Notes (Signed)
Summary: MRI  Phone Note Call from Patient   Caller: Patient Call For: Jayko Voorhees Summary of Call: Patient calling saying that insurance will not pay for MRI wants to know if there is anything else she can do before paying out og pocket for the MRI Initial call taken by: Benny Lennert CMA Duncan Dull),  January 20, 2010 8:33 AM  Follow-up for Phone Call        Shirlee Limerick, can you check on this? Did we complete preapproval process and get a denial or is this a deductible issue?  I would offer [employer] a nuclear medicine bone scan as an alternative imaging modality.  Heather, let know I am asking Shirlee Limerick to check on approval from [employer]. Can continue conservative management and I can reassess in a couple of weeks.    Follow-up by: Hannah Beat MD,  January 20, 2010 12:07 PM  Additional Follow-up for Phone Call Additional follow up Details #1::        The MRI was approved. She has not met the dedictible which is $1000.00 So whatever she has done will probably have to pay for it. Shes only met $93 of her deductible. They offered her a 24 month payment plan also at [facility]. Dont know how much  nuclear bone scan costs that is done at the hospital. She now wants me to cancel the MRI for tomorrow. She will come back to see you for FU already has an appt. Additional Follow-up by: Carlton Adam,  January 20, 2010 12:41 PM    Additional Follow-up for Phone Call Additional follow up Details #2::    I think that is reasonable. Thank-you -- likely OK to follow clinically. If symptoms are persistent, will reevaluate then. Follow-up by: Hannah Beat MD,  January 20, 2010 2:44 PM

## 2010-08-05 NOTE — Miscellaneous (Signed)
Summary: [facility]  [facility]   Imported By: Lester [place] 11/06/2009 10:20:05  _____________________________________________________________________  External Attachment:    Type:   Image     Comment:   External Document

## 2010-08-05 NOTE — Assessment & Plan Note (Signed)
Summary: R EAR PAIN/CLE   Vital Signs:  Patient profile:   50 year old female Height:      60 inches Weight:      208 pounds BMI:     40.77 Temp:     98.4 degrees F oral Pulse rate:   76 / minute Pulse rhythm:   regular BP sitting:   102 / 62  (left arm) Cuff size:   large  Vitals Entered By: Delilah Shan CMA Duncan Dull) (September 21, 2009 10:39 AM) CC: Right ear pain, dizzy.   Acute Visit History:      The patient complains of earache and sinus problems.  These symptoms began 3 days ago.  She denies cough, fever, nasal discharge, and sore throat.  Other comments include: Recent asthma flare..due to pollens...increased Advair..Dr. Maple Hudson  Using clartitn, flonase, singulair.        The earache is located on the right side.  There is no history of recent antibiotic usage or recurrent otitis media associated with the earache.        She complains of sinus pressure and ears being blocked.        Problems Prior to Update: 1)  Adverse Reaction Influenza Vaccine  () 2)  Other Diseases of Nasal Cavity and Sinuses  (ICD-478.19) 3)  Wheezing  (ICD-786.07) 4)  Asthma, Extrinsic, With Acute Exacerbation  (ICD-493.02) 5)  Essential Hypertension  (ICD-401.9) 6)  Angioedema  (ICD-995.1) 7)  Hx of Gestational Diabetes  (ICD-648.80) 8)  Hx of Hiatal Hernia  (ICD-553.3) 9)  Hypertension  (ICD-401.9) 10)  Gerd  (ICD-530.81) 11)  Asthma  (ICD-493.90) 12)  Allergic Rhinitis  (ICD-477.9)  Current Medications (verified): 1)  Lisinopril-Hydrochlorothiazide 10-12.5 Mg  Tabs (Lisinopril-Hydrochlorothiazide) .[name].. 1 By Mouth Qd 2)  Multivitamins  Tabs (Multiple Vitamin) .... One By Mouth Daily 3)  Claritin 10 Mg Tabs (Loratadine) .[name].. 1 By Mouth Once Daily 4)  Flonase 50 Mcg/act Susp (Fluticasone Propionate) .... 2 Sprays in Each Nostril Once Daily 5)  Proventil Hfa 108 (90 Base) Mcg/act Aers (Albuterol Sulfate) .... 2 Puffs Up To Every 4 Hours As Needed Wheezing 6)  Advair Diskus 100-50 Mcg/dose Misc  (Fluticasone-Salmeterol) .[name].. 1 Inhalation Two Times A Day 7)  Epipen 0.3 Mg/0.20ml (1:1000) Devi (Epinephrine Hcl (Anaphylaxis)) .... For Severe Allergic Reaction 8)  Sudafed 30 Mg Tabs (Pseudoephedrine Hcl) .... Otc As Directed 9)  Singulair 10 Mg Tabs (Montelukast Sodium) .[name].. 1 Daily 10)  Clarithromycin 500 Mg Tabs (Clarithromycin) .[name].. 1 Tab By Mouth Two Times A Day X 10 Days  Allergies: 1)  ! Augmentin 2)  ! * Flu Vaccine  Past History:  Past medical, surgical, family and social histories (including risk factors) reviewed, and no changes noted (except as noted below).  Past Medical History: Reviewed history from 09/03/2008 and no changes required. Allergic rhinitis- POS SKIN TEST 09/03/08- triggered wheeze Asthma GERD Hypertension  Past Surgical History: Reviewed history from 08/21/2008 and no changes required. Right breast biopsy- benign Right ganglion cyst Tubal ligation cardiac cath- Neg  Family History: Reviewed history from 03/19/2007 and no changes required. Father: diabetes Mother: glaucoma, HTN, sarcoidosis Siblings: 2 brothers, 1 sister  Social History: Reviewed history from 08/21/2008 and no changes required. 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   Review of Systems General:  Complains of fatigue. CV:  Denies chest pain or discomfort. Resp:  Denies shortness of breath, sputum productive, and wheezing.  Physical Exam  General:  fatigued  overweight female in NAd Head:  very ttp over right maxillary sinus Ears:  yellowish fluid B TMs , no erythema no bulging.  Nose:  nasal discharge and mucosal pallor.   Mouth:  Oral mucosa and oropharynx without lesions or exudates.  Teeth in good repair. Neck:  no carotid bruit or thyromegaly no cervical or supraclavicular lymphadenopathy  Lungs:  Normal respiratory effort, chest expands symmetrically. Lungs are clear to auscultation, no crackles or  wheezes. Heart:  Normal rate and regular rhythm. S1 and S2 normal without gallop, murmur, click, rub or other extra sounds.   Impression & Recommendations:  Problem # 1:  SINUSITIS, ACUTE (ICD-461.9) Treat with 10 days antibiotic, allergic to amox.  Continue allergy medicaitons, start nasal irrigation. Continue mucinex. Call if not imrpoving as expected.  Her updated medication list for this problem includes:    Flonase 50 Mcg/act Susp (Fluticasone propionate) .[name]... 2 sprays in each nostril once daily    Sudafed 30 Mg Tabs (Pseudoephedrine hcl) ..... Otc as directed    Clarithromycin 500 Mg Tabs (Clarithromycin) .[name]... 1 tab by mouth two times a day x 10 days  Complete Medication List: 1)  Lisinopril-hydrochlorothiazide 10-12.5 Mg Tabs (Lisinopril-hydrochlorothiazide) .[name].. 1 by mouth qd 2)  Multivitamins Tabs (Multiple vitamin) .... One by mouth daily 3)  Claritin 10 Mg Tabs (Loratadine) .[name].. 1 by mouth once daily 4)  Flonase 50 Mcg/act Susp (Fluticasone propionate) .... 2 sprays in each nostril once daily 5)  Proventil Hfa 108 (90 Base) Mcg/act Aers (Albuterol sulfate) .... 2 puffs up to every 4 hours as needed wheezing 6)  Advair Diskus 100-50 Mcg/dose Misc (Fluticasone-salmeterol) .[name].. 1 inhalation two times a day 7)  Epipen 0.3 Mg/0.55ml (1:1000) Devi (Epinephrine hcl (anaphylaxis)) .... For severe allergic reaction 8)  Sudafed 30 Mg Tabs (Pseudoephedrine hcl) .... Otc as directed 9)  Singulair 10 Mg Tabs (Montelukast sodium) .[name].. 1 daily 10)  Clarithromycin 500 Mg Tabs (Clarithromycin) .[name].. 1 tab by mouth two times a day x 10 days  Patient Instructions: 1)  NASal saline irrigation before flonase and later in day. Mucinex as mucolytic. Continue other allergy medicaiton. Start antibiotics for sinus infection.  Prescriptions: CLARITHROMYCIN 500 MG TABS (CLARITHROMYCIN) 1 tab by mouth two times a day x 10 days  #20 x 0   Entered and Authorized by:   Kerby Nora MD   Signed by:   Kerby Nora MD on 09/21/2009   Method used:   Electronically to        [facility]* (retail)       [address]       [place], [place]  [address]       Ph: [phone]       Fax: [phone]   RxID:   [id number]   Current Allergies (reviewed today): ! AUGMENTIN ! * FLU VACCINE

## 2010-08-05 NOTE — Assessment & Plan Note (Signed)
Summary: 6 months/ apc   Copy to:  [facility] Katherine Tanner/Referring Katherine Tanner:  Katherine Tanner  CC:  6 month follow up visit-SOB and wheezing at times; possible work note to park closer due to Holiday representative at work..  History of Present Illness: June 03, 2009- Asthma, allergic rhinitis The day of her flu shot she began to itch generally. Then over next day chest tighter and wheezing. Itching is gone. Denies fever, swollen glands, sore throat or GI.  June 03, 2009- Asthma, allergic rhinitis last week we gave flu vax and depo shot. Same day she began to itch, Then next day she had chest tightness and now cough and wheeze. Denies sore throat, purulent, glands, fever or GI.  Nov 24, 2009- Asthma, allergic rhinitis Some spring pollen rhinitis. Exposed to strong fragrance triggered some wheeze last month, but otw uses rescue inhaler only about once per month. Denies bothersome dry cough usually- no concerns with lisinopril. Aware of some cough at night "strangle" but not aware of reflux, and a remote sleep study was negative. Yesterday moving a refrigerator- dust made throat itch and chest tighten. Better today. Hasn't missed work since flu season.  May 31, 2010- Asthma, allergic rhinitis CC-6 month follow up visit-SOB and wheezing at times; possible work note to park closer due to Holiday representative at work. Intermittent dyspnea with weather changes. Gets so dyspneic outside she has trouble walking to car at times. Using rescue inhaler 1x/ week. Declines flu vax "allergic"   Asthma History    Initial Asthma Severity Rating:    Age range: 12+ years    Symptoms: >2 days/week; not daily    Nighttime Awakenings: 0-2/month    Interferes w/ normal activity: some limitations    SABA use (not for EIB): >2 days/week but not >1X/day    Asthma Severity Assessment: Moderate Persistent   Preventive Screening-Counseling & Management  Alcohol-Tobacco     Alcohol drinks/day: 0     Smoking Status:  never  Current Medications (verified): 1)  Lisinopril-Hydrochlorothiazide 10-12.5 Mg  Tabs (Lisinopril-Hydrochlorothiazide) .[name].. 1 By Mouth Once Daily 2)  Multivitamins  Tabs (Multiple Vitamin) .... One By Mouth Daily 3)  Claritin 10 Mg Tabs (Loratadine) .[name].. 1 By Mouth Once Daily As Needed 4)  Flonase 50 Mcg/act Susp (Fluticasone Propionate) .... 2 Sprays in Each Nostril Once Daily 5)  Proventil Hfa 108 (90 Base) Mcg/act Aers (Albuterol Sulfate) .... 2 Puffs Up To Every 4 Hours As Needed Wheezing 6)  Advair Diskus 250-50 Mcg/dose Aepb (Fluticasone-Salmeterol) .[name].. 1 Puff and Rinse Mouth, Twice Daily 7)  Sudafed 30 Mg Tabs (Pseudoephedrine Hcl) .... Otc As Directed 8)  Singulair 10 Mg Tabs (Montelukast Sodium) .[name].. 1 Daily 9)  Allegra 60 Mg Tabs (Fexofenadine Hcl) .... Otc As Directed. 10)  Aleve 220 Mg Tabs (Naproxen Sodium) .... As Needed Per Bottle  Allergies (verified): 1)  ! Augmentin 2)  ! 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   Risk Factors: Alcohol Use: 0 (05/31/2010)  Risk Factors: Smoking Status: never (05/31/2010)  Review of Systems      See HPI       The patient complains of shortness of breath with activity.  The patient denies shortness of breath at rest, productive cough, non-productive cough, coughing up blood, chest pain, irregular heartbeats, acid heartburn, indigestion, loss of appetite, weight  change, abdominal pain, difficulty swallowing, sore throat, tooth/dental problems, headaches, nasal congestion/difficulty breathing through nose, and sneezing.    Vital Signs:  Patient profile:   50 year old female Height:      60 inches Weight:      205.50 pounds BMI:     40.28 O2 Sat:      97 % on Room air Pulse rate:   85 / minute BP sitting:   126 / 76  (left arm) Cuff size:   regular  Vitals Entered By: Katherine Tanner CMA (May 31, 2010 2:17 PM)  O2 Flow:  Room air CC: 6 month follow up visit-SOB and wheezing at times; possible work note to park closer due to Holiday representative at work.   Physical Exam  Additional Exam:  General: A/Ox3; pleasant and cooperative, NAD, SKIN: no rash, lesions NODES: no lymphadenopathy HEENT: [place]/AT, EOM- WNL, Conjuctivae- clear, PERRLA, TM-WN, , Nose- sniffing, Throat- clear and wnl, Mallampati  III-IV NECK: Supple w/ fair ROM, JVD- none, normal carotid impulses w/o bruits Thyroid- CHEST: Clear to P&A, throat clearing HEART: RRR, no m/g/r heard ABDOMEN: Soft and nl;  ZOX:WRUE, nl pulses, no edema  NEURO: Grossly intact to observation      Impression & Recommendations:  Problem # 1:  ASTHMA, EXTRINSIC, WITH ACUTE EXACERBATION (ICD-493.02) I think she is reacting to humidity outdoors. Will give HC parking CXR  Problem # 2:  GERD (ICD-530.81)  Will resume Prilosec otc, with educational discussion about reflux and asthma.   Medications Added to Medication List This Visit: 1)  Aleve 220 Mg Tabs (Naproxen sodium) .... As needed per bottle  Other Orders: Est. Patient Level III (45409) T-2 View CXR (71020TC)  Patient Instructions: 1)  Please schedule a follow-up appointment in 4 months. 2)  HC parking 3)  Resume regular use of Prilosec and see if that stops you waking at night choking.  4)  A chest x-ray has been recommended.  Your imaging study may require preauthorization.   Prevention & Chronic Care Immunizations   Influenza  vaccine: Not documented    Tetanus booster: 01/02/2001: Historical    Pneumococcal vaccine: Not documented  Other Screening   Pap smear: normal  (04/03/2009)    Mammogram: normal  (04/03/2009)   Smoking status: never  (05/31/2010)  Lipids   Total Cholesterol: 199  (01/05/2010)   LDL: 120  (01/05/2010)   LDL Direct: Not documented   HDL: 70  (01/05/2010)   Triglycerides: 45  (01/05/2010)  Hypertension   Last Blood Pressure: 126 / 76  (05/31/2010)   Serum creatinine: 0.62  (01/05/2010)   Serum potassium 4.6  (01/05/2010)  Self-Management Support :    Hypertension self-management support: Not documented

## 2010-08-05 NOTE — Progress Notes (Signed)
Summary: Change Advair Rx  Phone Note Call from Patient Call back at Work Phone [phone]   Caller: Patient Call For: young Summary of Call: Pt called stating that she would like to know if [facility] will increase her Advair from 100/50 to 250/50 since allergens are in bloom. States that it will help her through this time. Please advise if this is ok and if you would like to give a sample or call in Rx. Initial call taken by: Reynaldo Minium CMA,  September 15, 2009 9:40 AM  Follow-up for Phone Call        Please call pt before 130pm today and let her know that sample is at front and also send Rx for Advair 250/50 to patients pharmacy. Reynaldo Minium CMA  September 15, 2009 10:31 AM    pt aware sample at front desk for pt to pick up.  Aundra Millet Reynolds LPN  September 15, 2009 10:35 AM

## 2010-08-05 NOTE — Progress Notes (Signed)
  Phone Note Call from Patient   Summary of Call: Pt is very concerned about side effects of celebrex. With dx of asthma she did not take med.  Initial call taken by: Lamar Sprinkles, CMA,  December 16, 2009 3:20 PM  Follow-up for Phone Call        ok Follow-up by: Etta Grandchild MD,  December 16, 2009 3:22 PM

## 2010-09-21 ENCOUNTER — Encounter: Payer: Self-pay | Admitting: Internal Medicine

## 2010-09-28 ENCOUNTER — Encounter: Payer: Self-pay | Admitting: Internal Medicine

## 2010-09-28 ENCOUNTER — Ambulatory Visit (INDEPENDENT_AMBULATORY_CARE_PROVIDER_SITE_OTHER): Payer: 59 | Admitting: Internal Medicine

## 2010-09-28 VITALS — BP 118/72 | HR 77 | Ht 61.0 in | Wt 209.4 lb

## 2010-09-28 DIAGNOSIS — J45909 Unspecified asthma, uncomplicated: Secondary | ICD-10-CM

## 2010-09-28 DIAGNOSIS — J309 Allergic rhinitis, unspecified: Secondary | ICD-10-CM

## 2010-09-28 NOTE — Patient Instructions (Signed)
For 1 month, try taking samples Micardis HCT 80/ 12.5, instead of your lisinopril. See if you can tell that you cough less after you have been on the Micardis. If so, then ask Dr Milinda Antis about a permanent change.

## 2010-09-28 NOTE — Assessment & Plan Note (Signed)
She takes claritin or allegra as needed and this seems to be sufficient.

## 2010-09-28 NOTE — Progress Notes (Signed)
  Subjective:    Patient ID: Katherine Tanner, female    DOB: 1960-10-23, 50 y.o.   MRN: [id number]  HPI 50 yoF followed here for Allergic rhinitis, Asthma, and hx of GERD.  Last here May 31, 2010. With cold air she used her proventil rescue inhaler more. Now in last 2 weeks she has noted pollen effects with chest tightness, sinus pressure headaches, itching eyes. She continues the Advair 250. Must use rescue inhaler with exercise, 2-3x / week. Not wheezing at night.  We discussed her lisinopril/ ACE inhibitor.    Review of Systems Per HPI.  Constitutional:   No weight loss, night sweats,  Fevers, chills, fatigue, lassitude. HEENT:   No headaches,  Difficulty swallowing,  Tooth/dental problems,  Sore throat,              Yes-sneezing, itching, , nasal congestion, post nasal drip,   CV:  No chest pain,  Orthopnea, PND, swelling in lower extremities, anasarca, dizziness, palpitations  GI  No heartburn, indigestion, abdominal pain, nausea, vomiting, diarrhea, change in bowel habits, loss of appetite  Resp: Some shortness of breath with exertion .  No excess mucus, no productive cough.  Non-productive cough,  No coughing up of blood.  No change in color of mucus.  No wheezing.  No chest wall deformity  Skin: no rash or lesions.  GU: no dysuria, change in color of urine, no urgency or frequency.  No flank pain.  MS:  No joint pain or swelling.  No decreased range of motion.  No back pain.  Psych:  No change in mood or affect. No depression or anxiety.  No memory loss.     Objective:   Physical Exam General- Alert, Oriented, Affect-appropriate, Distress- none acute. Overweight  Skin- rash-none, lesions- none, excoriation- none  Lymphadenopathy- none  Head- atraumatic  Eyes- Gross vision intact, PERRLA, conjunctivae clear, secretions  Ears- Normal Hearing, canals, Tm L ,   R ,  Nose- Clear, No-  Septal dev, mucus, polyps, erosion, perforation   Throat- Mallampati III  , mucosa clear , drainage- none, tonsils- atrophic  Neck- flexible , trachea midline, no stridor , thyroid nl, carotid no bruit  Chest - symmetrical excursion , unlabored     Heart/CV- RRR , no murmur , no gallop  , no rub, nl s1 s2                     - JVD- none , edema- none, stasis changes- none, varices- none     Lung- clear to P&A, wheeze- none, cough- none , dullness-none, rub- none     Chest wall- atraumatic, no scar  Abd- tender-no, distended-no, bowel sounds-present, HSM- no  Br/ Gen/ Rectal- Not done, not indicated  Extrem- cyanosis- none, clubbing, none, atrophy- none, strength- nl  Neuro- grossly intact to observation        Assessment & Plan:

## 2010-09-28 NOTE — Assessment & Plan Note (Addendum)
Recent viral then seasonal pollen related exacerbations. I want to try her off lisinopril for a month to see if that is a component of her cough and chest tightness.  We have re-educated on use of inhalers .

## 2010-12-23 ENCOUNTER — Other Ambulatory Visit: Payer: Self-pay | Admitting: Internal Medicine

## 2011-01-13 ENCOUNTER — Other Ambulatory Visit: Payer: Self-pay | Admitting: *Deleted

## 2011-01-13 MED ORDER — LISINOPRIL-HYDROCHLOROTHIAZIDE 10-12.5 MG PO TABS: 1.0000 | ORAL_TABLET | Freq: Every day | ORAL | Status: DC

## 2011-02-06 ENCOUNTER — Inpatient Hospital Stay (INDEPENDENT_AMBULATORY_CARE_PROVIDER_SITE_OTHER)
Admission: RE | Admit: 2011-02-06 | Discharge: 2011-02-06 | Disposition: A | Discharge status: Home / Self Care | Payer: 59 | Source: Ambulatory Visit | Attending: Family Medicine | Admitting: Family Medicine

## 2011-02-06 DIAGNOSIS — J069 Acute upper respiratory infection, unspecified: Secondary | ICD-10-CM

## 2011-02-06 DIAGNOSIS — T6391XA Toxic effect of contact with unspecified venomous animal, accidental (unintentional), initial encounter: Secondary | ICD-10-CM

## 2011-02-07 ENCOUNTER — Telehealth: Payer: Self-pay | Admitting: Internal Medicine

## 2011-02-07 MED ORDER — PREDNISONE 20 MG PO TABS: ORAL_TABLET | ORAL | Status: DC

## 2011-02-07 NOTE — Telephone Encounter (Signed)
Spoke with pt and she states she would like the prednisone called in for her. She advised she would like this sent to [facility] pharmacy. Pt is aware to take 1 tablet a day x 3 days. Pt verbalized understanding

## 2011-02-07 NOTE — Telephone Encounter (Signed)
I think this will gradually go down. If she wants to try to hurry it along,         Offer prednisone 20 mg, # 3, 1 daily x 3 days.

## 2011-02-07 NOTE — Telephone Encounter (Signed)
Spoke with the pt and she states she was stung by a hornet on her cheek on Friday and she went to an urgent care and they gave her a zpak and some topical cream. She states she is still having swelling where she was stung on the cheek, and she feels like her lymph nodes are swollen at her throat. She denies any tongue or lip swelling or SOB or trouble swallowing. I advised the pt to contact her PCP for this but she states CY sees her for allergies so she wanted him to address. SHe is taking benadryl as well. The pt wants to know is there anything else she can do for the swelling? Please advise. [name], CMA Allergies  Allergen Reactions  . Influenza Virus Vaccine   . XBJ:[id number] Acid+Aspartame     REACTION: swelling

## 2011-02-20 ENCOUNTER — Emergency Department: Payer: Self-pay | Admitting: *Deleted

## 2011-02-22 ENCOUNTER — Ambulatory Visit (INDEPENDENT_AMBULATORY_CARE_PROVIDER_SITE_OTHER): Payer: 59 | Admitting: Family Medicine

## 2011-02-22 ENCOUNTER — Encounter: Payer: Self-pay | Admitting: Family Medicine

## 2011-02-22 DIAGNOSIS — M25519 Pain in unspecified shoulder: Secondary | ICD-10-CM

## 2011-02-22 DIAGNOSIS — M549 Dorsalgia, unspecified: Secondary | ICD-10-CM

## 2011-02-22 DIAGNOSIS — M542 Cervicalgia: Secondary | ICD-10-CM

## 2011-02-22 MED ORDER — TRAMADOL HCL 50 MG PO TABS
50.0000 mg | ORAL_TABLET | Freq: Four times a day (QID) | ORAL | Status: AC | PRN
Start: 2011-02-22 — End: 2011-03-04

## 2011-02-22 MED ORDER — TIZANIDINE HCL 4 MG PO TABS: 4.0000 mg | ORAL_TABLET | Freq: Every evening | ORAL | Status: AC
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
## 2011-02-22 NOTE — Patient Instructions (Signed)
Recheck about 10 days

## 2011-02-24 ENCOUNTER — Encounter: Payer: Self-pay | Admitting: Family Medicine

## 2011-03-02 ENCOUNTER — Encounter: Payer: Self-pay | Admitting: Family Medicine

## 2011-03-02 ENCOUNTER — Ambulatory Visit (INDEPENDENT_AMBULATORY_CARE_PROVIDER_SITE_OTHER): Payer: 59 | Admitting: Family Medicine

## 2011-03-02 VITALS — BP 130/80 | HR 87 | Temp 98.7°F | Ht 61.0 in | Wt 213.4 lb

## 2011-03-02 DIAGNOSIS — M542 Cervicalgia: Secondary | ICD-10-CM

## 2011-03-02 DIAGNOSIS — M25519 Pain in unspecified shoulder: Secondary | ICD-10-CM

## 2011-03-02 DIAGNOSIS — M549 Dorsalgia, unspecified: Secondary | ICD-10-CM
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
## 2011-03-08 ENCOUNTER — Encounter: Payer: Self-pay | Admitting: Family Medicine

## 2011-03-08 ENCOUNTER — Ambulatory Visit (INDEPENDENT_AMBULATORY_CARE_PROVIDER_SITE_OTHER): Payer: 59 | Admitting: Family Medicine

## 2011-03-08 VITALS — BP 130/76 | HR 77 | Temp 98.0°F | Wt 215.2 lb

## 2011-03-08 DIAGNOSIS — M542 Cervicalgia: Secondary | ICD-10-CM

## 2011-03-08 DIAGNOSIS — M545 Low back pain, unspecified: Secondary | ICD-10-CM

## 2011-03-08 DIAGNOSIS — I10 Essential (primary) hypertension: Secondary | ICD-10-CM

## 2011-03-08 MED ORDER — NAPROXEN 500 MG PO TBEC: 500.0000 mg | DELAYED_RELEASE_TABLET | Freq: Two times a day (BID) | ORAL | Status: DC

## 2011-03-08 NOTE — Patient Instructions (Signed)
Get a cervical support pillow and use it alone or on top of your regular pillow (made of foam with 2 lumps)  Use heat 10 minutes at a time We will do referral for PT at check out  Try naproxen up to twice daily with meals Update if not improving
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
## 2011-03-11 ENCOUNTER — Other Ambulatory Visit: Payer: Self-pay | Admitting: *Deleted

## 2011-03-11 MED ORDER — LISINOPRIL-HYDROCHLOROTHIAZIDE 10-12.5 MG PO TABS: 1.0000 | ORAL_TABLET | Freq: Every day | ORAL | Status: DC

## 2011-03-15 ENCOUNTER — Telehealth: Payer: Self-pay | Admitting: *Deleted

## 2011-03-15 DIAGNOSIS — Z0279 Encounter for issue of other medical certificate: Secondary | ICD-10-CM

## 2011-03-15 NOTE — Telephone Encounter (Signed)
Pt states her disability insurance company has faxed forms for her twice and we have not responded.  Last faxed yesterday.  Do you have these?  If so, pt would like to know the status.

## 2011-03-15 NOTE — Telephone Encounter (Signed)
Call.  Done. I was out of town last week.

## 2011-03-16 NOTE — Telephone Encounter (Signed)
Patients husband advised paper work faxed back

## 2011-03-31 ENCOUNTER — Ambulatory Visit: Payer: 59 | Admitting: Internal Medicine

## 2011-04-05 ENCOUNTER — Ambulatory Visit: Payer: 59 | Admitting: Internal Medicine

## 2011-05-02 ENCOUNTER — Ambulatory Visit (INDEPENDENT_AMBULATORY_CARE_PROVIDER_SITE_OTHER): Payer: 59 | Admitting: Internal Medicine

## 2011-05-02 ENCOUNTER — Other Ambulatory Visit: Payer: Self-pay | Admitting: Internal Medicine

## 2011-05-02 ENCOUNTER — Encounter: Payer: Self-pay | Admitting: Internal Medicine

## 2011-05-02 VITALS — BP 140/80 | HR 80 | Ht 61.0 in | Wt 214.6 lb

## 2011-05-02 DIAGNOSIS — T8062XA Other serum reaction due to vaccination, initial encounter: Secondary | ICD-10-CM

## 2011-05-02 DIAGNOSIS — J309 Allergic rhinitis, unspecified: Secondary | ICD-10-CM

## 2011-05-02 DIAGNOSIS — J45901 Unspecified asthma with (acute) exacerbation: Secondary | ICD-10-CM

## 2011-05-02 DIAGNOSIS — Z887 Allergy status to serum and vaccine status: Secondary | ICD-10-CM

## 2011-05-02 MED ORDER — OSELTAMIVIR PHOSPHATE 75 MG PO CAPS: 75.0000 mg | ORAL_CAPSULE | Freq: Two times a day (BID) | ORAL | Status: AC

## 2011-05-02 NOTE — Assessment & Plan Note (Signed)
I discussed influenza vaccine and alternative management. We're providing prescription for Tamiflu to hold.

## 2011-05-02 NOTE — Assessment & Plan Note (Signed)
Recent exacerbation related to the season. I don't think she has a viral infection. She does not need systemic steroids currently. We reviewed medications.

## 2011-05-02 NOTE — Assessment & Plan Note (Signed)
Recent mild seasonal exacerbation of allergic rhinitis and conjunctivitis. Available medications should be sufficient.

## 2011-05-02 NOTE — Patient Instructions (Signed)
Script for Tamiflu- to take immediately if you do develop the flu  Please call for refills as needed. You might want to ask the pharmacy to check on how soon this may be, so you aren't surprised  Form for [facility], confirming flu vaccine intolerance.

## 2011-05-02 NOTE — Progress Notes (Signed)
Subjective:    Patient ID: Katherine Tanner, female    DOB: 1961/03/08, 50 y.o.   MRN: [id number]  HPI 49 yoF followed here for Allergic rhinitis, Asthma, and hx of GERD.  Last here May 31, 2010. With cold air she used her proventil rescue inhaler more. Now in last 2 weeks she has noted pollen effects with chest tightness, sinus pressure headaches, itching eyes. She continues the Advair 250. Must use rescue inhaler with exercise, 2-3x / week. Not wheezing at night.  We discussed her lisinopril/ ACE inhibitor.   05/02/11- 50 yoF followed here for Allergic rhinitis, Asthma, and hx of GERD.  She felt comfortable through the summer. In recent days has noted some itching of eyes and ears blamed on wind and fallen leaves. Needed to use her rescue inhaler today. Asks we sign a form confirming intolerance of flu vaccine for her employer. She required steroids for wheezing triggered by a flu vaccine in the past.  Review of Systems Per HPI.  Constitutional:   No-   weight loss, night sweats, fevers, chills, fatigue, lassitude. HEENT:   No-  headaches, difficulty swallowing, tooth/dental problems, sore throat,       +sneezing, itching, ear ache, nasal congestion, post nasal drip,  CV:  No-   chest pain, orthopnea, PND, swelling in lower extremities, anasarca, dizziness, palpitations Resp: No-   shortness of breath with exertion or at rest.              No-   productive cough,  No non-productive cough,  No- coughing up of blood.              No-   change in color of mucus.  Recent wheezing.   Skin: No-   rash or lesions. GI:  No-   heartburn, indigestion, abdominal pain, nausea, vomiting, diarrhea,                 change in bowel habits, loss of appetite GU: No-   dysuria, change in color of urine, no urgency or frequency.  No- flank pain. MS:  No-   joint pain or swelling.  No- decreased range of motion.  No- back pain. Neuro-     nothing unusual Psych:  No- change in mood or affect. No  depression or anxiety.  No memory loss.   Objective:   Physical Exam General- Alert, Oriented, Affect-appropriate, Distress- none acute; overweight Skin- rash-none, lesions- none, excoriation- none Lymphadenopathy- none Head- atraumatic            Eyes- Gross vision intact, PERRLA, conjunctivae clear, no discolored secretions            Ears- Hearing, canals-normal            Nose- Clear, no-Septal dev, mucus, polyps, erosion, perforation             Throat- Mallampati II , mucosa clear , drainage- none, tonsils- atrophic Neck- flexible , trachea midline, no stridor , thyroid nl, carotid no bruit Chest - symmetrical excursion , unlabored           Heart/CV- RRR , no murmur , no gallop  , no rub, nl s1 s2                           - JVD- none , edema- none, stasis changes- none, varices- none           Lung- clear to P&A, wheeze- none, cough- none ,  dullness-none, rub- none           Chest wall-  Abd- tender-no, distended-no, bowel sounds-present, HSM- no Br/ Gen/ Rectal- Not done, not indicated Extrem- cyanosis- none, clubbing, none, atrophy- none, strength- nl Neuro- grossly intact to observation

## 2011-06-07 ENCOUNTER — Encounter: Payer: Self-pay | Admitting: Family Medicine

## 2011-06-07 ENCOUNTER — Ambulatory Visit (INDEPENDENT_AMBULATORY_CARE_PROVIDER_SITE_OTHER): Payer: 59 | Admitting: Family Medicine

## 2011-06-07 VITALS — BP 124/76 | HR 84 | Temp 98.6°F | Ht 61.0 in | Wt 211.2 lb

## 2011-06-07 DIAGNOSIS — M545 Low back pain, unspecified: Secondary | ICD-10-CM

## 2011-06-07 DIAGNOSIS — M542 Cervicalgia: Secondary | ICD-10-CM

## 2011-06-07 DIAGNOSIS — J069 Acute upper respiratory infection, unspecified: Secondary | ICD-10-CM

## 2011-06-07 NOTE — Patient Instructions (Addendum)
We will do orthopedic referral at check out  Try robitussin DM for cough - this should help/ watch out for sedation  Drink plenty of fluids and try to get some extra rest  This will probably get worse before it gets better - but if short of breath/ high fever / worse wheezing - let me know  Use heat on neck and shoulders when you can to relax the muscles
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
## 2011-06-07 NOTE — Assessment & Plan Note (Signed)
With cough - no reactive airways today  Disc nature of viral illness Disc symptomatic care - see instructions on AVS -- will try robitussin DM prn  If worse/ fever/wheeze enc to call
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
## 2011-07-04 ENCOUNTER — Telehealth: Payer: Self-pay | Admitting: Internal Medicine

## 2011-07-04 MED ORDER — AZITHROMYCIN 250 MG PO TABS: ORAL_TABLET | ORAL | Status: AC

## 2011-07-04 NOTE — Telephone Encounter (Signed)
Please go ahead and call in zithromax  Schedule f/u with me later this week - but if worse in meantime go to [facility]

## 2011-07-04 NOTE — Telephone Encounter (Signed)
Patient notified as instructed by telephone. Follow-up appointment made as instructed. Rx called to [facility].

## 2011-07-04 NOTE — Telephone Encounter (Signed)
Patient was seen in office on the 4th of December and states the virus has not improved.  Her symptoms are coughing up thick dark yellowish with blood mucus.  Her lungs and back hurt.  Please advise.

## 2011-07-08 ENCOUNTER — Encounter: Payer: Self-pay | Admitting: Family Medicine

## 2011-07-08 ENCOUNTER — Ambulatory Visit (INDEPENDENT_AMBULATORY_CARE_PROVIDER_SITE_OTHER): Payer: 59 | Admitting: Family Medicine

## 2011-07-08 VITALS — BP 124/74 | HR 76 | Temp 98.5°F | Resp 20 | Ht 61.0 in | Wt 210.8 lb

## 2011-07-08 DIAGNOSIS — J209 Acute bronchitis, unspecified: Secondary | ICD-10-CM

## 2011-07-08 MED ORDER — HYDROCOD POLST-CHLORPHEN POLST 10-8 MG/5ML PO LQCR: 5.0000 mL | Freq: Two times a day (BID) | ORAL | Status: DC

## 2011-07-08 MED ORDER — LISINOPRIL-HYDROCHLOROTHIAZIDE 10-12.5 MG PO TABS: 1.0000 | ORAL_TABLET | Freq: Every day | ORAL | Status: DC

## 2011-07-08 MED ORDER — BENZONATATE 100 MG PO CAPS: 100.0000 mg | ORAL_CAPSULE | Freq: Three times a day (TID) | ORAL | Status: AC | PRN

## 2011-07-08 NOTE — Patient Instructions (Signed)
You have bronchitis  This can be either viral or bacterial (the zpak covers bacteria and viruses just take time to get better ) Try the tussionex when home and not driving (sedating) Try the tessalon pills when working our out  Drink lots of fluids Rest this weekend Update if not starting to improve in a week or if worsening  (if wheezing worse please call )

## 2011-07-08 NOTE — Progress Notes (Signed)
Subjective:    Patient ID: Katherine Tanner, female    DOB: 1960/07/06, 51 y.o.   MRN: [id number]  HPI Here for f/u of cough and uri  Last visit dx with virus It got worse Called in zpak due to worsening prod cough with yellow phlegm and chest wall pain  Is staying about the same - has coughed so hard her ribs hurt No fever  Cannot get flu shot  At first had a sore throat - now not that bad/ ears are ok  Some congestion  Some sinus pressure - not bad   Using inhaler twice daily - helps Pain in ribs is posterior   Finished up the zpack today   Robitussin DM   Patient Active Problem List  Diagnoses  . HYPERTENSION  . ALLERGIC RHINITIS  . ASTHMA  . GERD  . HIATAL HERNIA  . GESTATIONAL DIABETES  . POLYARTHRITIS  . ANGIOEDEMA  . Neck pain  . Low back pain  . 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 Viral URI with cough  . Bronchitis, acute   Past Medical History  Diagnosis Date  . Hx of tubal ligation   . History of breast biopsy   . History of cardiac catheterization   . Ganglion cyst   . Allergy   . Asthma   . GERD (gastroesophageal reflux disease)   . Hypertension   . 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 Breast surgery     Biopsy, benign  . Tubal ligation   . Right ganglion cyst   . Cardiac catheterization    History  Substance Use Topics  . Smoking status: Never Smoker   . Smokeless tobacco: Not on file  . Alcohol Use: No   Family History  Problem Relation Age of Onset  . Diabetes Father   . Sarcoidosis Mother   . Glaucoma Mother   . Hypertension Mother    Allergies  Allergen Reactions  . Flumist   . NWG:NFAOZHYQMVH+QIONGEXBM+WUXLKGMWNU Acid+Aspartame     REACTION: swelling   Current Outpatient Prescriptions on File Prior to Visit  Medication Sig Dispense Refill  . ADVAIR DISKUS 250-50 MCG/DOSE AEPB INHALE 1 PUFF TWICE DAILY AND RINSE MOUTH AFTER  60 each  PRN  . albuterol (PROVENTIL HFA) 108  (90 BASE) MCG/ACT inhaler Inhale 2 puffs into the lungs every 6 (six) hours as needed.        [name] azithromycin (ZITHROMAX Z-PAK) 250 MG tablet Take 2 pills by mouth today and then one pill each day by mouth for 4 days  6 each  0  . fexofenadine (ALLEGRA) 60 MG tablet Take 60 mg by mouth daily.        . fluticasone (FLONASE) 50 MCG/ACT nasal spray 2 sprays by Nasal route daily.        [name] loratadine (CLARITIN) 10 MG tablet Take 10 mg by mouth daily.        . Multiple Vitamin (MULTIVITAMIN PO) Take by mouth.        . pseudoephedrine (SUDAFED) 30 MG tablet Take 30 mg by mouth every 4 (four) hours as needed.        [name] SINGULAIR 10 MG tablet TAKE 1 TABLET BY MOUTH ONCE DAILY  30 tablet  4  . tiZANidine (ZANAFLEX) 4 MG tablet Take 1 tablet by mouth daily.      . traMADol (ULTRAM) 50 MG tablet Take 1 tablet by mouth Every 6 hours as needed.  Review of Systems Review of Systems  Constitutional: Negative for fever, appetite change, fatigue and unexpected weight change.  Eyes: Negative for pain and visual disturbance.  ENT pos for post nasal drip and cong, neg for ear pain  Respiratory: Negative for sob , pos for mild wheeze controlled by inhlaer   Cardiovascular: Negative for cp or palpitations    Gastrointestinal: Negative for nausea, diarrhea and constipation.  Genitourinary: Negative for urgency and frequency.  Skin: Negative for pallor or rash   Neurological: Negative for weakness, light-headedness, numbness and headaches.  Hematological: Negative for adenopathy. Does not bruise/bleed easily.  Psychiatric/Behavioral: Negative for dysphoric mood. The patient is not nervous/anxious.         Objective:   Physical Exam  Constitutional: She appears well-nourished. No distress.       Fatigued appearing   HENT:  Head: Normocephalic and atraumatic.  Right Ear: External ear normal.  Left Ear: External ear normal.  Mouth/Throat: Oropharynx is clear and moist. No oropharyngeal exudate.        Nares are injected and congested   No sinus tenderness Throat- post nasal drip   Eyes: Conjunctivae and EOM are normal. Pupils are equal, round, and reactive to light. Right eye exhibits no discharge. Left eye exhibits no discharge.  Neck: Normal range of motion. Neck supple.  Cardiovascular: Normal rate, regular rhythm and normal heart sounds.   Pulmonary/Chest: Effort normal. No respiratory distress. She has wheezes. She has no rales. She exhibits tenderness.       Wheeze on forced exp only  Few isolated rhonchi with harsh bs No rales  Some posterior rib tenderness  Musculoskeletal: She exhibits no edema.  Lymphadenopathy:    She has no cervical adenopathy.  Neurological: She is alert.  Skin: Skin is warm and dry. No rash noted. No pallor.  Psychiatric: She has a normal mood and affect.          Assessment & Plan:

## 2011-07-08 NOTE — Assessment & Plan Note (Signed)
With cough / congestion s/p viral uri  Has finished zpak  Re assuring exam and reactive airways are mild Will tx cough aggressively with tussionex and tesaalon- see avs Update if not starting to improve in a week or if worsening

## 2011-07-11 ENCOUNTER — Other Ambulatory Visit: Payer: Self-pay | Admitting: Internal Medicine

## 2011-07-13 ENCOUNTER — Other Ambulatory Visit: Payer: Self-pay | Admitting: Internal Medicine

## 2011-07-20 ENCOUNTER — Other Ambulatory Visit: Payer: Self-pay | Admitting: Obstetrics and Gynecology

## 2011-07-20 DIAGNOSIS — Z1231 Encounter for screening mammogram for malignant neoplasm of breast: Secondary | ICD-10-CM

## 2011-08-05 ENCOUNTER — Ambulatory Visit
Admission: RE | Admit: 2011-08-05 | Discharge: 2011-08-05 | Disposition: A | Discharge status: Home / Self Care | Payer: 59 | Source: Ambulatory Visit | Attending: Obstetrics and Gynecology | Admitting: Obstetrics and Gynecology

## 2011-08-05 DIAGNOSIS — Z1231 Encounter for screening mammogram for malignant neoplasm of breast: Secondary | ICD-10-CM

## 2011-09-14 ENCOUNTER — Telehealth: Payer: Self-pay | Admitting: Family Medicine

## 2011-09-14 NOTE — Telephone Encounter (Signed)
Patient notified as instructed by telephone. Pt was seen at [facility] clinic walk in.

## 2011-09-14 NOTE — Telephone Encounter (Signed)
Agree with that recommendation.

## 2011-09-14 NOTE — Telephone Encounter (Signed)
Triage Record Num: [id number] Operator: Geanie Berlin Patient Name: Katherine Tanner Call Date & Time: 09/14/2011 10:33:32AM Patient Phone: [phone] PCP: Audrie Gallus. Tower Patient Gender: Female PCP Fax : Patient DOB: [birth year] Practice Name: [facility] Reason for Call: Caller: Katherine Tanner/Patient; PCP: Roxy Manns A.; CB#: [phone]; Call regarding Body Aches,Headache, Cough, Fever, Diarrhea; Onset: 09/13/11. Temp 99.8. Has not used "emergency [place]fu" RX on hand. Exposed to Norovirus at work. Using Albutrerol MDI prn for chest tightness; unable to take deep breath. Chest pain when coughs. No appt remain for work in at office per note from [place]. Advised see [facility] now for breathing problems per Flu-Like Symptoms Guideline. Protocol(s) Used: Flu-Like Symptoms Recommended Outcome per Protocol: See ED Immediately Reason for Outcome: Breathing problems Care Advice: ~ Another adult should drive. Write down provider's name. List or place the following in a bag for transport with the patient: current prescription and/or nonprescription medications; alternative treatments, therapies and medications; and street drugs. ~ ~ Place person in a position of comfort and loosen tight clothing. ~ Use prescribed medications, especially rescue inhalers, as directed. Influenza Respiratory Hygiene: - Cover the nose/mouth tightly with a tissue when coughing or sneezing. - Use tissue one time and discard in the nearest waste receptacle. - Wash hands with soap and water or alcohol-based hand rub for at least 15 seconds after coming into contact with respiratory secretions and contaminated objects/materials. - When a tissue is not available, cough into the bend of the elbow. - Avoid touching your eyes, nose or mouth. - When ill wear a disposable face mask when around others, especially around those at high risk for flu-related complications, to help decrease the likelihood of  infecting them. ~ 09/14/2011 10:46:24AM Page 1 of 1 [id number]

## 2011-09-26 ENCOUNTER — Other Ambulatory Visit: Payer: Self-pay | Admitting: Internal Medicine

## 2011-10-31 ENCOUNTER — Encounter: Payer: Self-pay | Admitting: Internal Medicine

## 2011-10-31 ENCOUNTER — Ambulatory Visit (INDEPENDENT_AMBULATORY_CARE_PROVIDER_SITE_OTHER): Payer: 59 | Admitting: Internal Medicine

## 2011-10-31 VITALS — BP 110/78 | HR 68 | Ht 61.0 in | Wt 208.4 lb

## 2011-10-31 DIAGNOSIS — J45909 Unspecified asthma, uncomplicated: Secondary | ICD-10-CM

## 2011-10-31 DIAGNOSIS — J302 Other seasonal allergic rhinitis: Secondary | ICD-10-CM

## 2011-10-31 DIAGNOSIS — J45998 Other asthma: Secondary | ICD-10-CM

## 2011-10-31 DIAGNOSIS — J309 Allergic rhinitis, unspecified: Secondary | ICD-10-CM

## 2011-10-31 NOTE — Patient Instructions (Signed)
Call for refills as needed  

## 2011-10-31 NOTE — Progress Notes (Signed)
Patient ID: Katherine Tanner, female    DOB: 02/13/1961, 51 y.o.   MRN: [id number]  HPI 36 yoF followed here for Allergic rhinitis, Asthma, and hx of GERD.  Last here May 31, 2010. With cold air she used her proventil rescue inhaler more. Now in last 2 weeks she has noted pollen effects with chest tightness, sinus pressure headaches, itching eyes. She continues the Advair 250. Must use rescue inhaler with exercise, 2-3x / week. Not wheezing at night.  We discussed her lisinopril/ ACE inhibitor.   05/02/11- 50 yoF followed here for Allergic rhinitis, Asthma, and hx of GERD.  She felt comfortable through the summer. In recent days has noted some itching of eyes and ears blamed on wind and fallen leaves. Needed to use her rescue inhaler today. Asks we sign a form confirming intolerance of flu vaccine for her employer. She required steroids for wheezing triggered by a flu vaccine in the past.  10/31/11- 50 yoF followed here for Allergic rhinitis, Asthma, and hx of GERD.  Likes Allegra better than Claritin this time of year, but feels very well controlled.  No problems with asthma. She is pleased with status.  Review of Systems Per HPI.  Constitutional:   No-   weight loss, night sweats, fevers, chills, fatigue, lassitude. HEENT:   No-  headaches, difficulty swallowing, tooth/dental problems, sore throat,       No-sneezing, itching, ear ache, nasal congestion, post nasal drip,  CV:  No-   chest pain, orthopnea, PND, swelling in lower extremities, anasarca, dizziness, palpitations Resp: No-   shortness of breath with exertion or at rest.              No-   productive cough,  No non-productive cough,  No- coughing up of blood.              No-   change in color of mucus.  Recent wheezing.   Skin: No-   rash or lesions. GI:  No-   heartburn, indigestion, abdominal pain, nausea, vomiting,  GU: . MS:  No-   joint pain or swelling.  . Neuro-     nothing unusual Psych:  No- change in  mood or affect. No depression or anxiety.  No memory loss.   Objective:   Physical Exam General- Alert, Oriented, Affect-appropriate, Distress- none acute; overweight Skin- rash-none, lesions- none, excoriation- none Lymphadenopathy- none Head- atraumatic            Eyes- Gross vision intact, PERRLA, conjunctivae clear, no discolored secretions            Ears- Hearing, canals-normal            Nose- Clear, no-Septal dev, mucus, polyps, erosion, perforation             Throat- Mallampati II , mucosa clear , drainage- none, tonsils- atrophic Neck- flexible , trachea midline, no stridor , thyroid nl, carotid no bruit Chest - symmetrical excursion , unlabored           Heart/CV- RRR , no murmur , no gallop  , no rub, nl s1 s2                           - JVD- none , edema- none, stasis changes- none, varices- none           Lung- clear to P&A, wheeze- none, cough- none , dullness-none, rub- none  Chest wall-  Abd- tender-no,  Br/ Gen/ Rectal- Not done, not indicated Extrem- cyanosis- none, clubbing, none, atrophy- none, strength- nl Neuro- grossly intact to observation

## 2011-10-31 NOTE — Assessment & Plan Note (Signed)
Good control

## 2011-10-31 NOTE — Assessment & Plan Note (Signed)
Very good control. She will have pharmacy call about refills.

## 2011-12-09 ENCOUNTER — Encounter: Payer: Self-pay | Admitting: Family Medicine

## 2011-12-09 ENCOUNTER — Ambulatory Visit (INDEPENDENT_AMBULATORY_CARE_PROVIDER_SITE_OTHER): Payer: 59 | Admitting: Family Medicine

## 2011-12-09 VITALS — BP 128/82 | HR 73 | Temp 97.7°F | Ht 61.0 in | Wt 208.5 lb

## 2011-12-09 DIAGNOSIS — Z23 Encounter for immunization: Secondary | ICD-10-CM

## 2011-12-09 DIAGNOSIS — Z0000 Encounter for general adult medical examination without abnormal findings: Secondary | ICD-10-CM

## 2011-12-09 LAB — CBC WITH DIFFERENTIAL/PLATELET
Basophils Relative: 0.3 % (ref 0.0–3.0)
Eosinophils Absolute: 0.1 10*3/uL (ref 0.0–0.7)
Eosinophils Relative: 0.5 % (ref 0.0–5.0)
HCT: 37.2 % (ref 36.0–46.0)
Hemoglobin: 12 g/dL (ref 12.0–15.0)
Lymphs Abs: 2.7 10*3/uL (ref 0.7–4.0)
MCHC: 32.3 g/dL (ref 30.0–36.0)
MCV: 88.2 fl (ref 78.0–100.0)
Monocytes Absolute: 0.8 10*3/uL (ref 0.1–1.0)
Neutro Abs: 7 10*3/uL (ref 1.4–7.7)
Neutrophils Relative %: 66.3 % (ref 43.0–77.0)
RBC: 4.22 Mil/uL (ref 3.87–5.11)
WBC: 10.6 10*3/uL — ABNORMAL HIGH (ref 4.5–10.5)

## 2011-12-09 LAB — LIPID PANEL
Cholesterol: 218 mg/dL — ABNORMAL HIGH (ref 0–200)
Total CHOL/HDL Ratio: 3
Triglycerides: 121 mg/dL (ref 0.0–149.0)
VLDL: 24.2 mg/dL (ref 0.0–40.0)

## 2011-12-09 LAB — COMPREHENSIVE METABOLIC PANEL
AST: 21 U/L (ref 0–37)
Albumin: 3.4 g/dL — ABNORMAL LOW (ref 3.5–5.2)
Alkaline Phosphatase: 198 U/L — ABNORMAL HIGH (ref 39–117)
BUN: 13 mg/dL (ref 6–23)
Creatinine, Ser: 0.7 mg/dL (ref 0.4–1.2)
Glucose, Bld: 80 mg/dL (ref 70–99)
Potassium: 3.4 mEq/L — ABNORMAL LOW (ref 3.5–5.1)

## 2011-12-09 LAB — TSH: TSH: 0.62 u[IU]/mL (ref 0.35–5.50)

## 2011-12-09 NOTE — Patient Instructions (Signed)
Let me know if you want a referral for orthopedics for back in the future Labs today for wellness Think about low impact exercise like bike or swimming Tetanus and pneumonia vaccines today

## 2011-12-09 NOTE — Progress Notes (Signed)
Subjective:    Patient ID: Katherine Tanner, female    DOB: 11-26-1960, 51 y.o.   MRN: [id number]  HPI hhere for wellness check for her employer Is doing ok overall   Struggling with chronic back pain deg disk dz Has had facet joint injections  Doctor suggested nerve blocks - she was not comfortable doing that (Dr Yevette Edwards) Is working with them    Wt is stable   Pap was 10/11 normal  Goes to Dr Thomasena Edis for that - she will make appt   mammo 2/13-normal Self exam-normal / no lumps   Tdap due-- will do today Pneumovax--will do today   Non smoker   Lab Results  Component Value Date   CHOL 199 01/05/2010   HDL 70 01/05/2010   LDLCALC 657* 01/05/2010   TRIG 45 01/05/2010   CHOLHDL 2.8 Ratio 01/05/2010      Chemistry      Component Value Date/Time   NA 139 01/05/2010 2038   K 4.6 01/05/2010 2038   CL 101 01/05/2010 2038   CO2 26 01/05/2010 2038   BUN 10 01/05/2010 2038   CREATININE 0.62 01/05/2010 2038      Component Value Date/Time   CALCIUM 10.3 01/05/2010 2038   ALKPHOS 145* 01/05/2010 2038   AST 29 01/05/2010 2038   ALT 36* 01/05/2010 2038   BILITOT 0.4 01/05/2010 2038      Is eating pretty healthy diet  Really wants to exercise - thinking about water - ? [employer]   Patient Active Problem List  Diagnoses  . HYPERTENSION  . Perennial allergic rhinitis with seasonal variation  . Allergic-infective asthma  . GERD  . HIATAL HERNIA  . GESTATIONAL DIABETES  . POLYARTHRITIS  . ANGIOEDEMA  . Neck pain  . Low back pain  . 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 Viral URI with cough  . Bronchitis, acute   Past Medical History  Diagnosis Date  . Hx of tubal ligation   . History of breast biopsy   . History of cardiac catheterization   . Ganglion cyst   . Allergy   . Asthma   . GERD (gastroesophageal reflux disease)   . Hypertension   . 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 Breast surgery     Biopsy, benign  . Tubal ligation   . Right  ganglion cyst   . Cardiac catheterization    History  Substance Use Topics  . Smoking status: Never Smoker   . Smokeless tobacco: Not on file  . Alcohol Use: No   Family History  Problem Relation Age of Onset  . Diabetes Father   . Sarcoidosis Mother   . Glaucoma Mother   . Hypertension Mother    Allergies  Allergen Reactions  . Amoxicillin-Pot Clavulanate     REACTION: swelling  . Influenza Virus Vacc Split Pf    Current Outpatient Prescriptions on File Prior to Visit  Medication Sig Dispense Refill  . ADVAIR DISKUS 250-50 MCG/DOSE AEPB INHALE 1 PUFF TWICE DAILY AND RINSE MOUTH AFTER  60 each  PRN  . fexofenadine (ALLEGRA) 60 MG tablet Take 60 mg by mouth daily.        . fluticasone (FLONASE) 50 MCG/ACT nasal spray USE 2 SPRAYS IN EACH NOSTRIL ONCE DAILY  16 g  3  . lisinopril-hydrochlorothiazide (PRINZIDE,ZESTORETIC) 10-12.5 MG per tablet Take 1 tablet by mouth daily.  30 tablet  11  . loratadine (CLARITIN) 10  MG tablet Take 10 mg by mouth daily as needed.       . meloxicam (MOBIC) 15 MG tablet Take 1 tablet by mouth Daily.      . montelukast (SINGULAIR) 10 MG tablet TAKE 1 TABLET BY MOUTH ONCE DAILY  30 tablet  4  . Multiple Vitamin (MULTIVITAMIN PO) Take by mouth.        . pseudoephedrine (SUDAFED) 30 MG tablet Take 30 mg by mouth every 4 (four) hours as needed.        . traMADol (ULTRAM) 50 MG tablet Take 1 tablet by mouth 2 (two) times daily.       . VENTOLIN HFA 108 (90 BASE) MCG/ACT inhaler INHALE 2 PUFFS UP TO EVERY 4 HOURS AS NEEDED FOR WHEEZING  18 each  3  . methocarbamol (ROBAXIN) 500 MG tablet Take 1 tablet by mouth. Every 6 hours as needed          Review of Systems Review of Systems  Constitutional: Negative for fever, appetite change, fatigue and unexpected weight change.  Eyes: Negative for pain and visual disturbance.  Respiratory: Negative for cough and shortness of breath.   Cardiovascular: Negative for cp or palpitations    Gastrointestinal: Negative  for nausea, diarrhea and constipation.  Genitourinary: Negative for urgency and frequency.  Skin: Negative for pallor or rash   MSK pos for chronic back pain  Neurological: Negative for weakness, light-headedness, numbness and headaches.  Hematological: Negative for adenopathy. Does not bruise/bleed easily.  Psychiatric/Behavioral: Negative for dysphoric mood. The patient is not nervous/anxious.         Objective:   Physical Exam  Constitutional: She appears well-developed and well-nourished.  HENT:  Head: Normocephalic and atraumatic.  Mouth/Throat: Oropharynx is clear and moist.  Eyes: Conjunctivae and EOM are normal. Pupils are equal, round, and reactive to light. Right eye exhibits no discharge. Left eye exhibits no discharge.  Neck: Normal range of motion. Neck supple. No JVD present. Carotid bruit is not present. No thyromegaly present.  Cardiovascular: Normal rate, regular rhythm, normal heart sounds and intact distal pulses.  Exam reveals no gallop.   Pulmonary/Chest: Effort normal and breath sounds normal. No respiratory distress. She has no wheezes.  Abdominal: Soft. Bowel sounds are normal. She exhibits no distension, no abdominal bruit and no mass. There is no tenderness.  Musculoskeletal: She exhibits no edema and no tenderness.       Poor  rom  LS and TS  Lymphadenopathy:    She has no cervical adenopathy.  Neurological: She is alert. She has normal reflexes.  Skin: Skin is warm and dry. No rash noted. No erythema. No pallor.  Psychiatric: She has a normal mood and affect.          Assessment & Plan:

## 2011-12-14 ENCOUNTER — Telehealth: Payer: Self-pay | Admitting: *Deleted

## 2011-12-14 NOTE — Telephone Encounter (Signed)
Patient notified of lab results. Was advised by patient that she has not had any symptoms of urinary or uri. Follow-up appointment scheduled as instructed.

## 2012-01-02 ENCOUNTER — Encounter: Payer: Self-pay | Admitting: Family Medicine

## 2012-01-02 ENCOUNTER — Ambulatory Visit (INDEPENDENT_AMBULATORY_CARE_PROVIDER_SITE_OTHER): Payer: 59 | Admitting: Family Medicine

## 2012-01-02 VITALS — BP 120/73 | HR 94 | Temp 98.0°F | Ht 61.0 in | Wt 208.0 lb

## 2012-01-02 DIAGNOSIS — R748 Abnormal levels of other serum enzymes: Secondary | ICD-10-CM

## 2012-01-02 DIAGNOSIS — E876 Hypokalemia: Secondary | ICD-10-CM

## 2012-01-02 DIAGNOSIS — D72829 Elevated white blood cell count, unspecified: Secondary | ICD-10-CM

## 2012-01-02 DIAGNOSIS — E785 Hyperlipidemia, unspecified: Secondary | ICD-10-CM

## 2012-01-02 NOTE — Assessment & Plan Note (Signed)
Likely from her diuretic  Had some cramps in legs that stopped after higher K diet Re check today and adv

## 2012-01-02 NOTE — Assessment & Plan Note (Signed)
Very mild last check -? Source  No symptoms  Re check today

## 2012-01-02 NOTE — Assessment & Plan Note (Signed)
Pt has had baseline high alk phos in past - but higher this last check  No GI symptoms  Was on tylenol at the time, no alcohol and no hx of hepatitis  Was having knee and back pain at the time Will check hepatic prof and alk phos isoenzymes today

## 2012-01-02 NOTE — Progress Notes (Signed)
Subjective:    Patient ID: Katherine Tanner, female    DOB: 01-Oct-1960, 51 y.o.   MRN: [id number]  HPI Here for f/u of labs/ several issues   Had MRI on her R knee for meniscal tear- and also arthritis - Dr Luiz Blare - waiting on surgical date  Thought initially was her back   Wt is stable with bmi of 39  bp 120/73- good   Last K was 3.4 - slt low  inst to eat high K foods Is on hctz  Is eating bananas and greek yogurt   Alk phos elevated at 198 and ALT was 46 Was taking pain medicines for her back and knee -- (no tylenol lately)- is on mobic and meloxicam  Alk phos was mildly elevated in past   Lipids up  Lab Results  Component Value Date   CHOL 218* 12/09/2011   CHOL 199 01/05/2010   CHOL 199 09/08/2006   Lab Results  Component Value Date   HDL 65.50 12/09/2011   HDL 70 01/05/2010   HDL 56.8 09/08/2006   Lab Results  Component Value Date   LDLCALC 120* 01/05/2010   LDLCALC 130* 09/08/2006   Lab Results  Component Value Date   TRIG 121.0 12/09/2011   TRIG 45 01/05/2010   TRIG 61 09/08/2006   Lab Results  Component Value Date   CHOLHDL 3 12/09/2011   CHOLHDL 2.8 Ratio 01/05/2010   CHOLHDL 3.5 CALC 09/08/2006   Lab Results  Component Value Date   LDLDIRECT 138.3 12/09/2011   now has changed diet a lot -eating more fish and salads and oatmeal - no more fried foods  Does not eat beef    Lab Results  Component Value Date   WBC 10.6* 12/09/2011   HGB 12.0 12/09/2011   HCT 37.2 12/09/2011   MCV 88.2 12/09/2011   PLT 378.0 12/09/2011   slt elevated wbc  No fever or constitutional symptoms   Lab Results  Component Value Date   TSH 0.62 12/09/2011    Patient Active Problem List  Diagnosis  . HYPERTENSION  . Perennial allergic rhinitis with seasonal variation  . Allergic-infective asthma  . GERD  . HIATAL HERNIA  . GESTATIONAL DIABETES  . POLYARTHRITIS  . ANGIOEDEMA  . Neck pain  . Low back pain  . 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 Viral URI with cough  . Bronchitis, acute  .  Routine general medical examination at a health care facility  . Hypokalemia  . Alkaline phosphatase elevation  . Hyperlipidemia  . Leukocytosis   Past Medical History  Diagnosis Date  . Hx of tubal ligation   . History of breast biopsy   . History of cardiac catheterization   . Ganglion cyst   . Allergy   . Asthma   . GERD (gastroesophageal reflux disease)   . Hypertension   . 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 Breast surgery     Biopsy, benign  . Tubal ligation   . Right ganglion cyst   . Cardiac catheterization    History  Substance Use Topics  . Smoking status: Never Smoker   . Smokeless tobacco: Not on file  . Alcohol Use: No   Family History  Problem Relation Age of Onset  . Diabetes Father   . Sarcoidosis Mother   . Glaucoma Mother   . Hypertension Mother    Allergies  Allergen Reactions  . Amoxicillin-Pot Clavulanate  REACTION: swelling  . Influenza Virus Vacc Split Pf    Current Outpatient Prescriptions on File Prior to Visit  Medication Sig Dispense Refill  . ADVAIR DISKUS 250-50 MCG/DOSE AEPB INHALE 1 PUFF TWICE DAILY AND RINSE MOUTH AFTER  60 each  PRN  . cyclobenzaprine (FLEXERIL) 10 MG tablet Take 10 mg by mouth 2 (two) times daily as needed.      . fexofenadine (ALLEGRA) 60 MG tablet Take 60 mg by mouth daily.        . fluticasone (FLONASE) 50 MCG/ACT nasal spray USE 2 SPRAYS IN EACH NOSTRIL ONCE DAILY  16 g  3  . lisinopril-hydrochlorothiazide (PRINZIDE,ZESTORETIC) 10-12.5 MG per tablet Take 1 tablet by mouth daily.  30 tablet  11  . loratadine (CLARITIN) 10 MG tablet Take 10 mg by mouth daily as needed.       . meloxicam (MOBIC) 15 MG tablet Take 1 tablet by mouth Daily.      . montelukast (SINGULAIR) 10 MG tablet TAKE 1 TABLET BY MOUTH ONCE DAILY  30 tablet  4  . Multiple Vitamin (MULTIVITAMIN PO) Take by mouth.        . pseudoephedrine (SUDAFED) 30 MG tablet Take 30 mg by mouth every 4  (four) hours as needed.        . traMADol (ULTRAM) 50 MG tablet Take 1 tablet by mouth 2 (two) times daily.       . VENTOLIN HFA 108 (90 BASE) MCG/ACT inhaler INHALE 2 PUFFS UP TO EVERY 4 HOURS AS NEEDED FOR WHEEZING  18 each  3  . methocarbamol (ROBAXIN) 500 MG tablet Take 1 tablet by mouth. Every 6 hours as needed         Review of Systems Review of Systems  Constitutional: Negative for fever, appetite change, fatigue and unexpected weight change.  Eyes: Negative for pain and visual disturbance.  Respiratory: Negative for cough and shortness of breath.   Cardiovascular: Negative for cp or palpitations    Gastrointestinal: Negative for nausea, diarrhea and constipation. neg for abd pain or jaundice Genitourinary: Negative for urgency and frequency. neg for burning on urination  Skin: Negative for pallor or rash   Neurological: Negative for weakness, light-headedness, numbness and headaches.  Hematological: Negative for adenopathy. Does not bruise/bleed easily.  Psychiatric/Behavioral: Negative for dysphoric mood. The patient is not nervous/anxious.         Objective:   Physical Exam  Constitutional: She appears well-developed and well-nourished. No distress.       Obese and well appearing   HENT:  Head: Normocephalic and atraumatic.  Mouth/Throat: Oropharynx is clear and moist.  Eyes: Conjunctivae and EOM are normal. Pupils are equal, round, and reactive to light. No scleral icterus.       No icterus   Neck: Normal range of motion. Neck supple. No JVD present. Carotid bruit is not present. Erythema present. No thyromegaly present.  Cardiovascular: Normal rate, regular rhythm, normal heart sounds and intact distal pulses.  Exam reveals no gallop.   Pulmonary/Chest: Effort normal and breath sounds normal. No respiratory distress. She has no wheezes.  Abdominal: Soft. Bowel sounds are normal. She exhibits no distension, no abdominal bruit and no mass. There is no hepatosplenomegaly.  There is no tenderness.  Musculoskeletal: She exhibits no edema and no tenderness.  Lymphadenopathy:    She has no cervical adenopathy.  Neurological: She is alert. She has normal reflexes. No cranial nerve deficit. She exhibits normal muscle tone. Coordination normal.  Skin: Skin is  warm and dry. No rash noted. No erythema. No pallor.       No jaundice   Psychiatric: She has a normal mood and affect.          Assessment & Plan:

## 2012-01-02 NOTE — Patient Instructions (Addendum)
We will re check labs today for potassium/ liver/ alk phos and cholesterol  Avoid red meat/ fried foods/ egg yolks/ fatty breakfast meats/ butter, cheese and high fat dairy/ and shellfish   Keep eating high potassium foods

## 2012-01-02 NOTE — Assessment & Plan Note (Signed)
Working hard on diet for almost a month Re check this Disc goals for lipids and reasons to control them Rev labs with pt Rev low sat fat diet in detail

## 2012-01-03 LAB — HEPATIC FUNCTION PANEL
AST: 28 U/L (ref 0–37)
Albumin: 3.7 g/dL (ref 3.5–5.2)
Alkaline Phosphatase: 131 U/L — ABNORMAL HIGH (ref 39–117)
Bilirubin, Direct: 0.1 mg/dL (ref 0.0–0.3)
Total Protein: 7.6 g/dL (ref 6.0–8.3)

## 2012-01-03 LAB — CBC
Hemoglobin: 11.6 g/dL — ABNORMAL LOW (ref 12.0–15.0)
MCHC: 32.3 g/dL (ref 30.0–36.0)
Platelets: 379 10*3/uL (ref 150.0–400.0)
RBC: 4.04 Mil/uL (ref 3.87–5.11)

## 2012-01-03 LAB — LIPID PANEL
HDL: 67.4 mg/dL (ref >39.00)
LDL Cholesterol: 118 mg/dL — ABNORMAL HIGH (ref 0–99)
Total CHOL/HDL Ratio: 3
Triglycerides: 55 mg/dL (ref 0.0–149.0)

## 2012-01-03 LAB — POTASSIUM: Potassium: 4.2 mEq/L (ref 3.5–5.1)

## 2012-01-07 LAB — ALKALINE PHOSPHATASE, ISOENZYMES
ALP, Heat Stable (Liver): 90 U/L
Alk Phos Liver Fract: 68 %

## 2012-02-07 ENCOUNTER — Other Ambulatory Visit: Payer: Self-pay | Admitting: Internal Medicine

## 2012-03-07 ENCOUNTER — Other Ambulatory Visit: Payer: Self-pay | Admitting: Internal Medicine

## 2012-04-11 ENCOUNTER — Other Ambulatory Visit: Payer: Self-pay | Admitting: *Deleted

## 2012-04-11 MED ORDER — LISINOPRIL-HYDROCHLOROTHIAZIDE 10-12.5 MG PO TABS: 1.0000 | ORAL_TABLET | Freq: Every day | ORAL | Status: DC

## 2012-04-16 MED ORDER — LISINOPRIL-HYDROCHLOROTHIAZIDE 10-12.5 MG PO TABS: 1.0000 | ORAL_TABLET | Freq: Every day | ORAL | Status: DC

## 2012-04-16 NOTE — Addendum Note (Signed)
Addended by: Liane Comber C on: 04/16/2012 10:02 AM   Modules accepted: Orders

## 2012-04-19 ENCOUNTER — Other Ambulatory Visit: Payer: Self-pay | Admitting: *Deleted

## 2012-04-19 MED ORDER — FLUTICASONE PROPIONATE 50 MCG/ACT NA SUSP: 2.0000 | Freq: Every day | NASAL | Status: DC

## 2012-04-19 MED ORDER — MONTELUKAST SODIUM 10 MG PO TABS: 10.0000 mg | ORAL_TABLET | Freq: Every day | ORAL | Status: DC

## 2012-04-27 ENCOUNTER — Other Ambulatory Visit (HOSPITAL_COMMUNITY)
Admission: RE | Admit: 2012-04-27 | Discharge: 2012-04-27 | Disposition: A | Discharge status: Home / Self Care | Payer: 59 | Source: Ambulatory Visit | Attending: Obstetrics and Gynecology | Admitting: Obstetrics and Gynecology

## 2012-04-27 ENCOUNTER — Other Ambulatory Visit: Payer: Self-pay | Admitting: Obstetrics and Gynecology

## 2012-04-27 DIAGNOSIS — Z01419 Encounter for gynecological examination (general) (routine) without abnormal findings: Secondary | ICD-10-CM | POA: Insufficient documentation

## 2012-05-01 ENCOUNTER — Encounter: Payer: Self-pay | Admitting: Internal Medicine

## 2012-05-01 ENCOUNTER — Ambulatory Visit (INDEPENDENT_AMBULATORY_CARE_PROVIDER_SITE_OTHER): Payer: 59 | Admitting: Internal Medicine

## 2012-05-01 VITALS — BP 110/66 | HR 77 | Ht 61.0 in | Wt 206.4 lb

## 2012-05-01 DIAGNOSIS — J309 Allergic rhinitis, unspecified: Secondary | ICD-10-CM

## 2012-05-01 DIAGNOSIS — J45909 Unspecified asthma, uncomplicated: Secondary | ICD-10-CM

## 2012-05-01 DIAGNOSIS — J45998 Other asthma: Secondary | ICD-10-CM

## 2012-05-01 DIAGNOSIS — J302 Other seasonal allergic rhinitis: Secondary | ICD-10-CM

## 2012-05-01 DIAGNOSIS — J3089 Other allergic rhinitis: Secondary | ICD-10-CM

## 2012-05-01 NOTE — Patient Instructions (Addendum)
Sample Dymista nasal spray    1-2 puffs each nostril once daily at bedtime  Please call as needed 

## 2012-05-01 NOTE — Progress Notes (Signed)
Patient ID: Katherine Tanner, female    DOB: 06-12-1961, 51 y.o.   MRN: [id number]  HPI 47 yoF followed here for Allergic rhinitis, Asthma, and hx of GERD.  Last here May 31, 2010. With cold air she used her proventil rescue inhaler more. Now in last 2 weeks she has noted pollen effects with chest tightness, sinus pressure headaches, itching eyes. She continues the Advair 250. Must use rescue inhaler with exercise, 2-3x / week. Not wheezing at night.  We discussed her lisinopril/ ACE inhibitor.   05/02/11- 50 yoF followed here for Allergic rhinitis, Asthma, and hx of GERD.  She felt comfortable through the summer. In recent days has noted some itching of eyes and ears blamed on wind and fallen leaves. Needed to use her rescue inhaler today. Asks we sign a form confirming intolerance of flu vaccine for her employer. She required steroids for wheezing triggered by a flu vaccine in the past.  10/31/11- 50 yoF followed here for Allergic rhinitis, Asthma, and hx of GERD.  Likes Allegra better than Claritin this time of year, but feels very well controlled.  No problems with asthma. She is pleased with status.  05/01/12- 51yoF never smoker followed here for Allergic rhinitis, Asthma, and hx of GERD. Nasal area feels full-started on Sudafed and has been around sick coworkers. Breathing overall doing well.  Does not think she has a cold. Using Flonase with Sudafed. She declines flu vaccine because she feels she is "allergic", saying that anytime she takes it it makes her sick. She has a Neti pot which she has not been using. No wheezing and no recent reflux symptoms.  Review of Systems-Per HPI.  Constitutional:   No-   weight loss, night sweats, fevers, chills, fatigue, lassitude. HEENT:   No-  headaches, difficulty swallowing, tooth/dental problems, sore throat,       No-sneezing, itching, ear ache, +nasal congestion, no- post nasal drip,  CV:  No-   chest pain, orthopnea, PND,  swelling in lower extremities, anasarca, dizziness, palpitations Resp: No-   shortness of breath with exertion or at rest.              No-   productive cough,  No non-productive cough,  No- coughing up of blood.              No-   change in color of mucus. No- wheezing.   Skin: No-   rash or lesions. GI:  No-   heartburn, indigestion, abdominal pain, nausea, vomiting,  GU: . MS:  No-   joint pain or swelling.  . Neuro-     nothing unusual Psych:  No- change in mood or affect. No depression or anxiety.  No memory loss.  Objective:   Physical Exam General- Alert, Oriented, Affect-appropriate, Distress- none acute; overweight Skin- rash-none, lesions- none, excoriation- none Lymphadenopathy- none Head- atraumatic            Eyes- Gross vision intact, PERRLA, conjunctivae clear, no discolored secretions            Ears- Hearing, canals-normal            Nose- Clear, no-Septal dev, mucus, polyps, erosion, perforation             Throat- Mallampati III , mucosa clear , drainage- none, tonsils- atrophic Neck- flexible , trachea midline, no stridor , thyroid nl, carotid no bruit Chest - symmetrical excursion , unlabored           Heart/CV-  RRR , no murmur , no gallop  , no rub, nl s1 s2                           - JVD- none , edema- none, stasis changes- none, varices- none           Lung- clear to P&A, wheeze- none, cough- none , dullness-none, rub- none           Chest wall-  Abd- tender-no,  Br/ Gen/ Rectal- Not done, not indicated Extrem- cyanosis- none, clubbing, none, atrophy- none, strength- nl Neuro- grossly intact to observation

## 2012-05-13 NOTE — Assessment & Plan Note (Signed)
Well controlled with no intervention needed now.

## 2012-05-13 NOTE — Assessment & Plan Note (Signed)
She is comfortable managing with Sudafed and Flonase. I suggested she use her Neti pot occasionally

## 2012-05-22 ENCOUNTER — Encounter: Payer: Self-pay | Admitting: Internal Medicine

## 2012-05-22 NOTE — Telephone Encounter (Signed)
Per CY-lets change the Advair to [facility] 100/5 2 puffs bid and rinse mouth well after use. I have sent email to patient and will await a call or email as to what pharmacy to send Rx to.

## 2012-06-01 ENCOUNTER — Telehealth: Payer: Self-pay | Admitting: Internal Medicine

## 2012-06-01 MED ORDER — MOMETASONE FURO-FORMOTEROL FUM 100-5 MCG/ACT IN AERO: 2.0000 | INHALATION_SPRAY | Freq: Two times a day (BID) | RESPIRATORY_TRACT | Status: DC

## 2012-06-01 NOTE — Telephone Encounter (Signed)
[facility].  Called and spoke with pt and she is aware.  Nothing further is needed.

## 2012-06-12 ENCOUNTER — Encounter (HOSPITAL_COMMUNITY): Payer: Self-pay | Admitting: Emergency Medicine

## 2012-06-12 ENCOUNTER — Emergency Department (HOSPITAL_COMMUNITY): Payer: Worker's Compensation

## 2012-06-12 ENCOUNTER — Emergency Department (HOSPITAL_COMMUNITY)
Admission: EM | Admit: 2012-06-12 | Discharge: 2012-06-12 | Disposition: A | Discharge status: Home / Self Care | Payer: Worker's Compensation | Attending: Emergency Medicine | Admitting: Emergency Medicine

## 2012-06-12 DIAGNOSIS — Z9889 Other specified postprocedural states: Secondary | ICD-10-CM | POA: Insufficient documentation

## 2012-06-12 DIAGNOSIS — Y939 Activity, unspecified: Secondary | ICD-10-CM | POA: Insufficient documentation

## 2012-06-12 DIAGNOSIS — M545 Low back pain, unspecified: Secondary | ICD-10-CM

## 2012-06-12 DIAGNOSIS — M25561 Pain in right knee: Secondary | ICD-10-CM

## 2012-06-12 DIAGNOSIS — W19XXXA Unspecified fall, initial encounter: Secondary | ICD-10-CM | POA: Insufficient documentation

## 2012-06-12 DIAGNOSIS — Z8739 Personal history of other diseases of the musculoskeletal system and connective tissue: Secondary | ICD-10-CM | POA: Insufficient documentation

## 2012-06-12 DIAGNOSIS — I10 Essential (primary) hypertension: Secondary | ICD-10-CM | POA: Insufficient documentation

## 2012-06-12 DIAGNOSIS — Z9861 Coronary angioplasty status: Secondary | ICD-10-CM | POA: Insufficient documentation

## 2012-06-12 DIAGNOSIS — Z79899 Other long term (current) drug therapy: Secondary | ICD-10-CM | POA: Insufficient documentation

## 2012-06-12 DIAGNOSIS — J45909 Unspecified asthma, uncomplicated: Secondary | ICD-10-CM | POA: Insufficient documentation

## 2012-06-12 DIAGNOSIS — Z9851 Tubal ligation status: Secondary | ICD-10-CM | POA: Insufficient documentation

## 2012-06-12 DIAGNOSIS — IMO0002 Reserved for concepts with insufficient information to code with codable children: Secondary | ICD-10-CM | POA: Insufficient documentation

## 2012-06-12 DIAGNOSIS — Y929 Unspecified place or not applicable: Secondary | ICD-10-CM | POA: Insufficient documentation

## 2012-06-12 DIAGNOSIS — Z8719 Personal history of other diseases of the digestive system: Secondary | ICD-10-CM | POA: Insufficient documentation

## 2012-06-12 MED ORDER — METHOCARBAMOL 500 MG PO TABS: 500.0000 mg | ORAL_TABLET | Freq: Two times a day (BID) | ORAL | Status: DC

## 2012-06-12 NOTE — ED Provider Notes (Signed)
History     CSN: [id number]  Arrival date & time 06/12/12  7829   First MD Initiated Contact with Patient 06/12/12 1007      Chief Complaint  Patient presents with  . Fall    (Consider location/radiation/quality/duration/timing/severity/associated sxs/prior treatment) HPI She presents to the emergency departments after a fall. 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 She did not completely fall but caught herself now having right knee pain and low back pain. She says that she needs a drug screen as well wall she's here. Surgery her meniscus back in July but has not had any problems with her knee since then. She got hit her knee or injuring her neck. Patient ambulates with limp at baseline and says that walking is not any harder than it was before. She says she would not have, if her supervisor did not require 2. Nd vss   Past Medical History  Diagnosis Date  . Hx of tubal ligation   . History of breast biopsy   . History of cardiac catheterization   . Ganglion cyst   . Allergy   . Asthma   . GERD (gastroesophageal reflux disease)   . Hypertension   . 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 Breast surgery     Biopsy, benign  . Tubal ligation   . Right ganglion cyst   . Cardiac catheterization   . Knee surgery     Family History  Problem Relation Age of Onset  . Diabetes Father   . Sarcoidosis Mother   . Glaucoma Mother   . Hypertension Mother     History  Substance Use Topics  . Smoking status: Never Smoker   . Smokeless tobacco: Not on file  . Alcohol Use: No    OB History    Grav Para Term Preterm Abortions TAB SAB Ect Mult Living                  Review of Systems  Review of Systems  Gen: no weight loss, fevers, chills, night sweats  Eyes: no discharge or drainage, no occular pain or visual changes  Nose: no epistaxis or rhinorrhea  Mouth: no  dental pain, no sore throat  Neck: no neck pain  Lungs:No wheezing, coughing or hemoptysis CV: no chest pain, palpitations, dependent edema or orthopnea  Abd: no abdominal pain, nausea, vomiting  GU: no dysuria or gross hematuria  MSK:  R knee pain and low back pain Neuro: no headache, no focal neurologic deficits  Skin: no abnormalities Psyche: negative.   Allergies  Amoxicillin-pot clavulanate and Influenza virus vacc split pf  Home Medications   Current Outpatient Rx  Name  Route  Sig  Dispense  Refill  . FLUTICASONE PROPIONATE 50 MCG/ACT NA SUSP   Nasal   Place 2 sprays into the nose daily.   48 g   3   . HYDROCODONE-ACETAMINOPHEN 5-500 MG PO TABS               . LISINOPRIL-HYDROCHLOROTHIAZIDE 10-12.5 MG PO TABS   Oral   Take 1 tablet by mouth daily.   90 tablet   2   . LORATADINE 10 MG PO TABS   Oral   Take 10 mg by mouth daily as needed.          [name] MONTELUKAST SODIUM 10 MG PO TABS  Oral   Take 1 tablet (10 mg total) by mouth at bedtime.   90 tablet   3   . MULTIVITAMIN PO   Oral   Take by mouth.           . TRAMADOL HCL 50 MG PO TABS   Oral   Take 1 tablet by mouth 2 (two) times daily.          . VENTOLIN HFA 108 (90 BASE) MCG/ACT IN AERS      INHALE 2 PUFFS UP TO EVERY 4 HOURS AS NEEDED FOR WHEEZING   18 each   3   . CYCLOBENZAPRINE HCL 10 MG PO TABS   Oral   Take 10 mg by mouth 2 (two) times daily as needed.         . MELOXICAM 15 MG PO TABS   Oral   Take 1 tablet by mouth Daily.         . MOMETASONE FURO-FORMOTEROL FUM 100-5 MCG/ACT IN AERO   Inhalation   Inhale 2 puffs into the lungs 2 (two) times daily.   3 Inhaler   3   . PSEUDOEPHEDRINE HCL 30 MG PO TABS   Oral   Take 30 mg by mouth every 4 (four) hours as needed.             BP 131/80  Pulse 100  Temp 98.2 F (36.8 C) (Oral)  Resp 18  SpO2 100%  LMP 04/12/2012  Physical Exam  Nursing note and vitals reviewed. Constitutional: She appears  well-developed and well-nourished. No distress.  HENT:  Head: Normocephalic and atraumatic.  Eyes: Pupils are equal, round, and reactive to light.  Neck: Normal range of motion. Neck supple.  Cardiovascular: Normal rate and regular rhythm.   Pulmonary/Chest: Effort normal.  Abdominal: Soft.  Musculoskeletal:       Right knee: She exhibits effusion. She exhibits normal range of motion, no swelling, no ecchymosis, no deformity, no laceration and no erythema. tenderness found. Medial joint line tenderness noted.       Back:        Equal strength to bilateral lower extremities. Neurosensory  function adequate to both legs. Skin color is normal. Skin is warm and moist. I see no step off deformity, no bony tenderness. Pt is able to ambulate without limp. Pain is relieved when sitting in certain positions. ROM is decreased due to pain. No crepitus, laceration, effusion, swelling.  Pulses are normal     Neurological: She is alert.  Skin: Skin is warm and dry.    ED Course  Procedures (including critical care time)   Labs Reviewed  URINE RAPID DRUG SCREEN (HOSP PERFORMED)   Dg Lumbar Spine Complete  06/12/2012  *RADIOLOGY REPORT*  Clinical Data: Fall, mid and lower back pain  LUMBAR SPINE - COMPLETE 4+ VIEW  Comparison: None.  Findings: No acute fracture, or malalignment.  Vertebral body heights and intervertebral disc spaces are maintained.  The right twelfth rib is hypoplastic.  Mild increased sclerosis at the L5-S1 facets consistent with mild arthropathy.  Additionally, there is very mild degenerative disc change at L5-S1. No evidence of anterolisthesis.  No significant foraminal stenosis on the oblique views.  IMPRESSION:  1.  No acute fracture, or malalignment. 2.  Mild degenerative changes at L5-S1.   Original Report Authenticated By: Malachy Moan, M.D.    Dg Knee Complete 4 Views Right  06/12/2012  *RADIOLOGY REPORT*  Clinical Data: Fall, generalized knee pain  RIGHT KNEE -  COMPLETE 4+ VIEW  Comparison: 12/11/2009  Findings: Four views of the right knee submitted.  No acute fracture or subluxation.  There is narrowing of medial joint compartment.  Mild spurring of medial femoral condyle and medial tibial plateau.  Mild spurring of patella.  Small joint effusion.  IMPRESSION: No acute fracture or subluxation.  Small joint effusion. Degenerative changes as described above.   Original Report Authenticated By: Natasha Mead, M.D.      No diagnosis found. Dx: fall Low back pain Right knee pain   MDM  Urine drug screen ordered X-rays are negative.  Patient with back pain. No neurological deficits. Patient is ambulatory. No warning symptoms of back pain including: loss of bowel or bladder control, night sweats, waking from sleep with back pain, unexplained fevers or weight loss, h/o cancer, IVDU, recent trauma. No concern for cauda equina, epidural abscess, or other serious cause of back pain. Conservative measures such as rest, ice/heat and pain medicine indicated with PCP follow-up if no improvement with conservative management.           Dorthula Matas, PA 06/12/12 1231

## 2012-06-12 NOTE — ED Notes (Signed)
PA at bedside.

## 2012-06-12 NOTE — ED Provider Notes (Signed)
Medical screening examination/treatment/procedure(s) were performed by non-physician practitioner and as supervising physician I was immediately available for consultation/collaboration.  Demarion Pondexter R. Ashvin Adelson, MD 06/12/12 1633 

## 2012-06-12 NOTE — ED Notes (Signed)
Pt states that she slipped at work today but caught herself before she actually fell. C/o right knee pain and lower right back pain. Had knee surgery back in July of this year. Denies hitting head or floor.

## 2012-06-15 ENCOUNTER — Ambulatory Visit: Payer: Self-pay | Admitting: Family Medicine

## 2012-09-04 ENCOUNTER — Other Ambulatory Visit: Payer: Self-pay

## 2012-09-04 ENCOUNTER — Other Ambulatory Visit: Payer: Self-pay | Admitting: Obstetrics and Gynecology

## 2012-09-04 DIAGNOSIS — Z1231 Encounter for screening mammogram for malignant neoplasm of breast: Secondary | ICD-10-CM

## 2012-09-20 ENCOUNTER — Other Ambulatory Visit: Payer: Self-pay | Admitting: Internal Medicine

## 2012-10-01 ENCOUNTER — Ambulatory Visit
Admission: RE | Admit: 2012-10-01 | Discharge: 2012-10-01 | Disposition: A | Discharge status: Home / Self Care | Payer: 59 | Source: Ambulatory Visit

## 2012-10-01 DIAGNOSIS — Z1231 Encounter for screening mammogram for malignant neoplasm of breast: Secondary | ICD-10-CM

## 2012-10-30 ENCOUNTER — Ambulatory Visit (INDEPENDENT_AMBULATORY_CARE_PROVIDER_SITE_OTHER): Payer: 59 | Admitting: Internal Medicine

## 2012-10-30 ENCOUNTER — Encounter: Payer: Self-pay | Admitting: Internal Medicine

## 2012-10-30 VITALS — BP 120/72 | HR 77 | Ht 61.0 in | Wt 209.0 lb

## 2012-10-30 DIAGNOSIS — J45909 Unspecified asthma, uncomplicated: Secondary | ICD-10-CM

## 2012-10-30 DIAGNOSIS — J309 Allergic rhinitis, unspecified: Secondary | ICD-10-CM

## 2012-10-30 DIAGNOSIS — J45998 Other asthma: Secondary | ICD-10-CM

## 2012-10-30 DIAGNOSIS — J302 Other seasonal allergic rhinitis: Secondary | ICD-10-CM

## 2012-10-30 NOTE — Progress Notes (Signed)
Patient ID: Katherine Tanner, female    DOB: 1961-06-22, 52 y.o.   MRN: [id number]  HPI 89 yoF followed here for Allergic rhinitis, Asthma, and hx of GERD.  Last here May 31, 2010. With cold air she used her proventil rescue inhaler more. Now in last 2 weeks she has noted pollen effects with chest tightness, sinus pressure headaches, itching eyes. She continues the Advair 250. Must use rescue inhaler with exercise, 2-3x / week. Not wheezing at night.  We discussed her lisinopril/ ACE inhibitor.   05/02/11- 50 yoF followed here for Allergic rhinitis, Asthma, and hx of GERD.  She felt comfortable through the summer. In recent days has noted some itching of eyes and ears blamed on wind and fallen leaves. Needed to use her rescue inhaler today. Asks we sign a form confirming intolerance of flu vaccine for her employer. She required steroids for wheezing triggered by a flu vaccine in the past.  10/31/11- 50 yoF followed here for Allergic rhinitis, Asthma, and hx of GERD.  Likes Allegra better than Claritin this time of year, but feels very well controlled.  No problems with asthma. She is pleased with status.  05/01/12- 51yoF never smoker followed here for Allergic rhinitis, Asthma, and hx of GERD. Nasal area feels full-started on Sudafed and has been around sick coworkers. Breathing overall doing well.  Does not think she has a cold. Using Flonase with Sudafed. She declines flu vaccine because she feels she is "allergic", saying that anytime she takes it it makes her sick. She has a Neti pot which she has not been using. No wheezing and no recent reflux symptoms.  10/30/12- 52yoF never smoker followed here for Allergic rhinitis, Asthma, and hx of GERD Follows for: having some nasal congestion having issues with all the pollen. some sob set off by smells.  Says touching pollen on her car caused a contact rash. Close make her chest tight. Not active wheezing. Continues lisinopril which  we discussed again. Using Claritin twice daily. Rescue inhaler 2 times last month.  Review of Systems-Per HPI.  Constitutional:   No-   weight loss, night sweats, fevers, chills, fatigue, lassitude. HEENT:   No-  headaches, difficulty swallowing, tooth/dental problems, sore throat,       No-sneezing, itching, ear ache, +nasal congestion, no- post nasal drip,  CV:  No-   chest pain, orthopnea, PND, swelling in lower extremities, anasarca, dizziness, palpitations Resp: No-   shortness of breath with exertion or at rest.              No-   productive cough,  + non-productive cough,  No- coughing up of blood.              No-   change in color of mucus. No- wheezing.   Skin: + rash GI:  No-   heartburn, indigestion, abdominal pain, nausea, vomiting,  GU: . MS:  No-   joint pain or swelling.  . Neuro-     nothing unusual Psych:  No- change in mood or affect. No depression or anxiety.  No memory loss.  Objective:   Physical Exam General- Alert, Oriented, Affect-appropriate, Distress- none acute; overweight Skin- rash-none, lesions- none, excoriation- none Lymphadenopathy- none Head- atraumatic            Eyes- Gross vision intact, PERRLA, conjunctivae clear, no discolored secretions            Ears- Hearing, canals-normal  Nose-+ minor sniffing, no-Septal dev, mucus, polyps, erosion, perforation             Throat- Mallampati III , mucosa clear , drainage- none, tonsils- atrophic Neck- flexible , trachea midline, no stridor , thyroid nl, carotid no bruit Chest - symmetrical excursion , unlabored           Heart/CV- RRR , no murmur , no gallop  , no rub, nl s1 s2                           - JVD- none , edema- none, stasis changes- none, varices- none           Lung- clear to P&A, wheeze- none, cough- none , dullness-none, rub- none           Chest wall-  Abd- tender-no,  Br/ Gen/ Rectal- Not done, not indicated Extrem- cyanosis- none, clubbing, none, atrophy- none, strength-  nl Neuro- grossly intact to observation

## 2012-10-30 NOTE — Patient Instructions (Addendum)
For one month, try samples of Benicar 40/25     Take 1/2 tab daily, instead of your lisinopril/HCTZ.  When you run out of the samples, go back to your regular medicine and discus with your regular doctor.   Ok to continue your other meds as you are doing.   Please call as needed

## 2012-11-07 NOTE — Assessment & Plan Note (Signed)
Seasonal exacerbation of rhinitis consistent with allergy. She thinks she controls this adequately with her meds

## 2012-11-07 NOTE — Assessment & Plan Note (Signed)
asthma and chest tightness. We discussed dry cough as a potential side effect of ACE inhibitors and she is willing to try samples of Benicar.

## 2012-12-17 ENCOUNTER — Encounter: Payer: Self-pay | Admitting: Family Medicine

## 2012-12-17 ENCOUNTER — Ambulatory Visit (INDEPENDENT_AMBULATORY_CARE_PROVIDER_SITE_OTHER): Payer: 59 | Admitting: Family Medicine

## 2012-12-17 VITALS — BP 122/78 | HR 75 | Temp 98.3°F | Ht 61.0 in | Wt 207.0 lb

## 2012-12-17 DIAGNOSIS — I10 Essential (primary) hypertension: Secondary | ICD-10-CM

## 2012-12-17 DIAGNOSIS — E785 Hyperlipidemia, unspecified: Secondary | ICD-10-CM

## 2012-12-17 MED ORDER — OLMESARTAN MEDOXOMIL-HCTZ 20-12.5 MG PO TABS: 1.0000 | ORAL_TABLET | Freq: Every day | ORAL | Status: DC

## 2012-12-17 NOTE — Assessment & Plan Note (Signed)
Lab today Diet is fair (had bacon this am)  Rev low sat fat diet and need for exercise

## 2012-12-17 NOTE — Assessment & Plan Note (Signed)
For ACE induced cough and asthma - will change to benicar hct 20-12.5 if it is covered (can consider other arb if not)  Pt has tried for 1 mo and bp is good Lab today  Disc need for wt loss and enc to continue walking

## 2012-12-17 NOTE — Progress Notes (Signed)
Subjective:    Patient ID: Katherine Tanner, female    DOB: Nov 26, 1960, 52 y.o.   MRN: [id number]  HPI Here for HTN  Dr Maple Hudson wanted her to see if the lisinopril is causing cough/ breathing problems She changed to benicar 20/12.5 samples for a months - and noticed a difference  Not coughing as much and less short of breath   Takes care of herself  Is struggling to loose weight  Also some irregular menses and hot flashes  Patient Active Problem List   Diagnosis Date Noted  . Hypokalemia 01/02/2012  . Alkaline phosphatase elevation 01/02/2012  . Hyperlipidemia 01/02/2012  . Leukocytosis 01/02/2012  . Routine general medical examination at a health care facility 12/09/2011  . 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 Neck pain 03/08/2011  . Low back pain 03/08/2011  . POLYARTHRITIS 01/28/2010  . ANGIOEDEMA 03/27/2007  . HYPERTENSION 03/19/2007  . Perennial allergic rhinitis with seasonal variation 03/19/2007  . Allergic-infective asthma 03/19/2007  . GERD 03/19/2007  . HIATAL HERNIA 03/19/2007  . GESTATIONAL DIABETES 03/19/2007   Past Medical History  Diagnosis Date  . Hx of tubal ligation   . History of breast biopsy   . History of cardiac catheterization   . Ganglion cyst   . Allergy   . Asthma   . GERD (gastroesophageal reflux disease)   . Hypertension   . 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 Breast surgery      Biopsy, benign  . Tubal ligation    . Right ganglion cyst    . Cardiac catheterization    . Knee surgery     History  Substance Use Topics  . Smoking status: Never Smoker   . Smokeless tobacco: Not on file  . Alcohol Use: No   Family History  Problem Relation Age of Onset  . Diabetes Father   . Sarcoidosis Mother   . Glaucoma Mother   . Hypertension Mother    Allergies  Allergen Reactions  . Amoxicillin-Pot Clavulanate     REACTION: swelling  . Influenza Virus Vacc Split Pf     Current Outpatient Prescriptions on File Prior to Visit  Medication Sig Dispense Refill  . cyclobenzaprine (FLEXERIL) 10 MG tablet Take 10 mg by mouth 2 (two) times daily as needed.      . fluticasone (FLONASE) 50 MCG/ACT nasal spray Place 2 sprays into the nose daily.  48 g  3  . HYDROcodone-acetaminophen (VICODIN) 5-500 MG per tablet       . lisinopril-hydrochlorothiazide (PRINZIDE,ZESTORETIC) 10-12.5 MG per tablet Take 1 tablet by mouth daily.  90 tablet  2  . loratadine (CLARITIN) 10 MG tablet Take 10 mg by mouth daily as needed.       . mometasone-formoterol (DULERA) 100-5 MCG/ACT AERO Inhale 2 puffs into the lungs 2 (two) times daily.  3 Inhaler  3  . montelukast (SINGULAIR) 10 MG tablet Take 1 tablet (10 mg total) by mouth at bedtime.  90 tablet  3  . Multiple Vitamin (MULTIVITAMIN PO) Take by mouth.        . pseudoephedrine (SUDAFED) 30 MG tablet Take 30 mg by mouth every 4 (four) hours as needed.        . traMADol (ULTRAM) 50 MG tablet Take 1 tablet by mouth 2 (two) times daily.       . VENTOLIN HFA 108 (90 BASE) MCG/ACT inhaler INHALE 2 PUFFS UP TO EVERY  4 HOURS AS NEEDED FOR WHEEZING  18 each  1   No current facility-administered medications on file prior to visit.     Review of Systems Review of Systems  Constitutional: Negative for fever, appetite change, fatigue and unexpected weight change.  ENT pos for rhinitis and sneezing Eyes: Negative for pain and visual disturbance.  Respiratory: pos for cough and occ wheeze/sob, neg for phlegm production   Cardiovascular: Negative for cp or palpitations    Gastrointestinal: Negative for nausea, diarrhea and constipation.  Genitourinary: Negative for urgency and frequency.  Skin: Negative for pallor or rash   Neurological: Negative for weakness, light-headedness, numbness and headaches.  Hematological: Negative for adenopathy. Does not bruise/bleed easily.  Psychiatric/Behavioral: Negative for dysphoric mood. The patient is not  nervous/anxious.         Objective:   Physical Exam  Constitutional: She appears well-developed and well-nourished. No distress.  obese and well appearing   HENT:  Head: Normocephalic and atraumatic.  Right Ear: External ear normal.  Left Ear: External ear normal.  Nose: Nose normal.  Mouth/Throat: Oropharynx is clear and moist.  Eyes: Conjunctivae and EOM are normal. Pupils are equal, round, and reactive to light. Right eye exhibits no discharge. Left eye exhibits no discharge. No scleral icterus.  Neck: Normal range of motion. Neck supple. No JVD present. Carotid bruit is not present. No thyromegaly present.  Abdominal: Soft. Bowel sounds are normal. She exhibits no distension and no abdominal bruit. There is no tenderness.  Lymphadenopathy:    She has no cervical adenopathy.  Neurological: She is alert. She has normal reflexes. No cranial nerve deficit. She exhibits normal muscle tone. Coordination normal.  Skin: Skin is warm and dry. No rash noted. No erythema. No pallor.  Psychiatric: She has a normal mood and affect.          Assessment & Plan:

## 2012-12-17 NOTE — Patient Instructions (Addendum)
Stop the lisinopril hct and take benicar 20-12.5 (generic)  If problems or issues with coverage have the pharmacy let us know  Labs today

## 2012-12-18 LAB — CBC WITH DIFFERENTIAL/PLATELET
Basophils Relative: 0.6 % (ref 0.0–3.0)
Eosinophils Absolute: 0.2 10*3/uL (ref 0.0–0.7)
HCT: 38 % (ref 36.0–46.0)
Hemoglobin: 12.5 g/dL (ref 12.0–15.0)
Lymphocytes Relative: 32.7 % (ref 12.0–46.0)
Lymphs Abs: 3 10*3/uL (ref 0.7–4.0)
MCHC: 32.8 g/dL (ref 30.0–36.0)
MCV: 88.1 fl (ref 78.0–100.0)
Monocytes Absolute: 0.8 10*3/uL (ref 0.1–1.0)
Neutro Abs: 5.2 10*3/uL (ref 1.4–7.7)
RBC: 4.31 Mil/uL (ref 3.87–5.11)

## 2012-12-18 LAB — COMPREHENSIVE METABOLIC PANEL
AST: 26 U/L (ref 0–37)
BUN: 12 mg/dL (ref 6–23)
Calcium: 10 mg/dL (ref 8.4–10.5)
Chloride: 102 mEq/L (ref 96–112)
Creatinine, Ser: 0.8 mg/dL (ref 0.4–1.2)

## 2012-12-18 LAB — LIPID PANEL
Cholesterol: 219 mg/dL — ABNORMAL HIGH (ref 0–200)
Total CHOL/HDL Ratio: 3
Triglycerides: 63 mg/dL (ref 0.0–149.0)

## 2012-12-19 ENCOUNTER — Encounter: Payer: Self-pay | Admitting: *Deleted

## 2012-12-31 ENCOUNTER — Telehealth: Payer: Self-pay

## 2012-12-31 MED ORDER — AZITHROMYCIN 250 MG PO TABS: ORAL_TABLET | ORAL | Status: DC

## 2012-12-31 NOTE — Telephone Encounter (Signed)
Pt notified Rx sent into pharmacy and f/u if no improvement, pt verbalized understanding

## 2012-12-31 NOTE — Telephone Encounter (Signed)
Will tx empirically for sinusitis since she has already been seen  zpak sent to [facility] F/u if no imp

## 2012-12-31 NOTE — Telephone Encounter (Signed)
Pt seen 12/17/12; pt said having sinus pressure,facial pain at eyes,h/a, productive cough with yellow phlegm, ? Fever, both ears are sore (same as when seen 2 weeks ago). [place]

## 2013-01-28 ENCOUNTER — Other Ambulatory Visit: Payer: Self-pay | Admitting: *Deleted

## 2013-01-28 MED ORDER — OLMESARTAN MEDOXOMIL-HCTZ 20-12.5 MG PO TABS: 1.0000 | ORAL_TABLET | Freq: Every day | ORAL | Status: DC

## 2013-04-01 ENCOUNTER — Other Ambulatory Visit: Payer: Self-pay | Admitting: Internal Medicine

## 2013-04-30 ENCOUNTER — Other Ambulatory Visit: Payer: Self-pay | Admitting: Obstetrics and Gynecology

## 2013-04-30 ENCOUNTER — Other Ambulatory Visit (HOSPITAL_COMMUNITY)
Admission: RE | Admit: 2013-04-30 | Discharge: 2013-04-30 | Disposition: A | Discharge status: Home / Self Care | Payer: 59 | Source: Ambulatory Visit | Attending: Obstetrics and Gynecology | Admitting: Obstetrics and Gynecology

## 2013-04-30 DIAGNOSIS — Z1151 Encounter for screening for human papillomavirus (HPV): Secondary | ICD-10-CM | POA: Insufficient documentation

## 2013-04-30 DIAGNOSIS — Z01419 Encounter for gynecological examination (general) (routine) without abnormal findings: Secondary | ICD-10-CM | POA: Insufficient documentation

## 2013-05-02 ENCOUNTER — Ambulatory Visit (INDEPENDENT_AMBULATORY_CARE_PROVIDER_SITE_OTHER): Payer: 59 | Admitting: Internal Medicine

## 2013-05-02 ENCOUNTER — Encounter: Payer: Self-pay | Admitting: Internal Medicine

## 2013-05-02 DIAGNOSIS — J45998 Other asthma: Secondary | ICD-10-CM

## 2013-05-02 DIAGNOSIS — Z5189 Encounter for other specified aftercare: Secondary | ICD-10-CM

## 2013-05-02 DIAGNOSIS — J45909 Unspecified asthma, uncomplicated: Secondary | ICD-10-CM

## 2013-05-02 MED ORDER — FLUTICASONE PROPIONATE 50 MCG/ACT NA SUSP: 2.0000 | Freq: Every day | NASAL | Status: DC

## 2013-05-02 MED ORDER — MOMETASONE FURO-FORMOTEROL FUM 100-5 MCG/ACT IN AERO: 2.0000 | INHALATION_SPRAY | Freq: Two times a day (BID) | RESPIRATORY_TRACT | Status: DC

## 2013-05-02 MED ORDER — ALBUTEROL SULFATE HFA 108 (90 BASE) MCG/ACT IN AERS: INHALATION_SPRAY | RESPIRATORY_TRACT | Status: DC

## 2013-05-02 NOTE — Progress Notes (Signed)
Patient ID: Katherine Tanner, female    DOB: 11-13-1960, 52 y.o.   MRN: [id number]  HPI 76 yoF followed here for Allergic rhinitis, Asthma, and hx of GERD.  Last here May 31, 2010. With cold air she used her proventil rescue inhaler more. Now in last 2 weeks she has noted pollen effects with chest tightness, sinus pressure headaches, itching eyes. She continues the Advair 250. Must use rescue inhaler with exercise, 2-3x / week. Not wheezing at night.  We discussed her lisinopril/ ACE inhibitor.   05/02/11- 50 yoF followed here for Allergic rhinitis, Asthma, and hx of GERD.  She felt comfortable through the summer. In recent days has noted some itching of eyes and ears blamed on wind and fallen leaves. Needed to use her rescue inhaler today. Asks we sign a form confirming intolerance of flu vaccine for her employer. She required steroids for wheezing triggered by a flu vaccine in the past.  10/31/11- 50 yoF followed here for Allergic rhinitis, Asthma, and hx of GERD.  Likes Allegra better than Claritin this time of year, but feels very well controlled.  No problems with asthma. She is pleased with status.  05/01/12- 51yoF never smoker followed here for Allergic rhinitis, Asthma, and hx of GERD. Nasal area feels full-started on Sudafed and has been around sick coworkers. Breathing overall doing well.  Does not think she has a cold. Using Flonase with Sudafed. She declines flu vaccine because she feels she is "allergic", saying that anytime she takes it it makes her sick. She has a Neti pot which she has not been using. No wheezing and no recent reflux symptoms.  10/30/12- 52yoF never smoker followed here for Allergic rhinitis, Asthma, and hx of GERD Follows for: having some nasal congestion having issues with all the pollen. some sob set off by smells.  Says touching pollen on her car caused a contact rash. Close make her chest tight. Not active wheezing. Continues lisinopril which  we discussed again. Using Claritin twice daily. Rescue inhaler 2 times last month.  05/02/13- 52yoF never smoker followed here for Allergic rhinitis, Asthma, and hx of GERD FOLLOWS FOR: last week had a rough time-had to use inhalers; this week she is feeling better. She declines flu vaccine "allergic" Increased wheeze x2 weeks, it needing more rescue inhaler. Shortness of breath, wheeze and cough. Has continued Dulera. This week she feels much better, back at baseline. Eyes itch some, no nasal symptoms.  Review of Systems-Per HPI.  Constitutional:   No-   weight loss, night sweats, fevers, chills, fatigue, lassitude. HEENT:   No-  headaches, difficulty swallowing, tooth/dental problems, sore throat,       No-sneezing,+ itching, no-ear ache, nasal congestion, no- post nasal drip,  CV:  No-   chest pain, orthopnea, PND, swelling in lower extremities, anasarca, dizziness, palpitations Resp: No-   shortness of breath with exertion or at rest.              No-   productive cough,  these non-productive cough,  No- coughing up of blood.              No-   change in color of mucus. No- wheezing.   Skin: + rash GI:  No-   heartburn, indigestion, abdominal pain, nausea, vomiting,  GU: . MS:  No-   joint pain or swelling.  . Neuro-     nothing unusual Psych:  No- change in mood or affect. No depression or anxiety.  No memory loss.  Objective:   Physical Exam General- Alert, Oriented, Affect-appropriate, Distress- none acute; overweight Skin- rash-none, lesions- none, excoriation- none Lymphadenopathy- none Head- atraumatic            Eyes- Gross vision intact, PERRLA, conjunctivae clear, no discolored secretions            Ears- Hearing, canals-normal            Nose-+ minor sniffing, no-Septal dev, mucus, polyps, erosion, perforation             Throat- Mallampati III , mucosa clear , drainage- none, tonsils- atrophic Neck- flexible , trachea midline, no stridor , thyroid nl, carotid no  bruit Chest - symmetrical excursion , unlabored           Heart/CV- RRR , no murmur , no gallop  , no rub, nl s1 s2                           - JVD- none , edema- none, stasis changes- none, varices- none           Lung- clear to P&A, wheeze- none, cough- none , dullness-none, rub- none           Chest wall-  Abd- tender-no,  Br/ Gen/ Rectal- Not done, not indicated Extrem- cyanosis- none, clubbing, none, atrophy- none, strength- nl Neuro- grossly intact to observation

## 2013-05-02 NOTE — Patient Instructions (Signed)
Refill scripts sent to [facility] for Ventolin, Dulera, Flonase  Please call as needed

## 2013-05-19 NOTE — Assessment & Plan Note (Signed)
She reminded me of this history.  We discussed options

## 2013-05-19 NOTE — Assessment & Plan Note (Signed)
Clear now after recent exacerbation which may have been viral. Plan-continue routine meds with refills

## 2013-05-29 ENCOUNTER — Encounter: Payer: Self-pay | Admitting: Internal Medicine

## 2013-05-29 MED ORDER — FLUTICASONE PROPIONATE 50 MCG/ACT NA SUSP: 2.0000 | Freq: Every day | NASAL | Status: DC

## 2013-05-29 MED ORDER — MOMETASONE FURO-FORMOTEROL FUM 100-5 MCG/ACT IN AERO: 2.0000 | INHALATION_SPRAY | Freq: Two times a day (BID) | RESPIRATORY_TRACT | Status: DC

## 2013-05-29 MED ORDER — ALBUTEROL SULFATE HFA 108 (90 BASE) MCG/ACT IN AERS: INHALATION_SPRAY | RESPIRATORY_TRACT | Status: DC

## 2013-05-29 NOTE — Telephone Encounter (Signed)
Medications for [facility] have been resent d/t rx sent 05/02/13 expiring. Dulera, Ventolin and Flonase  ALL 19mo supply x 1 year

## 2013-06-05 ENCOUNTER — Telehealth: Payer: Self-pay | Admitting: Internal Medicine

## 2013-06-05 NOTE — Telephone Encounter (Signed)
Spoke with Katherine Tanner at [facility] to advise her the directions for the [facility] inhaler. 2 puffs bid and rinse mouth after each use. Nothing further needed

## 2013-07-25 ENCOUNTER — Encounter: Payer: Self-pay | Admitting: Family Medicine

## 2013-10-02 ENCOUNTER — Encounter: Payer: Self-pay | Admitting: Family Medicine

## 2013-10-02 ENCOUNTER — Encounter: Payer: Self-pay | Admitting: Internal Medicine

## 2013-10-02 NOTE — Telephone Encounter (Signed)
Last office visit 12/17/2012.  Ok to refill?

## 2013-10-03 ENCOUNTER — Telehealth: Payer: Self-pay | Admitting: Family Medicine

## 2013-10-03 ENCOUNTER — Other Ambulatory Visit: Payer: Self-pay

## 2013-10-03 DIAGNOSIS — Z1231 Encounter for screening mammogram for malignant neoplasm of breast: Secondary | ICD-10-CM

## 2013-10-03 MED ORDER — MOMETASONE FURO-FORMOTEROL FUM 100-5 MCG/ACT IN AERO: 2.0000 | INHALATION_SPRAY | Freq: Two times a day (BID) | RESPIRATORY_TRACT | Status: DC

## 2013-10-03 MED ORDER — OLMESARTAN MEDOXOMIL-HCTZ 20-12.5 MG PO TABS: 1.0000 | ORAL_TABLET | Freq: Every day | ORAL | Status: DC

## 2013-10-03 MED ORDER — MONTELUKAST SODIUM 10 MG PO TABS: 10.0000 mg | ORAL_TABLET | Freq: Every day | ORAL | Status: DC

## 2013-10-03 MED ORDER — FLUTICASONE PROPIONATE 50 MCG/ACT NA SUSP: 2.0000 | Freq: Every day | NASAL | Status: DC

## 2013-10-03 NOTE — Telephone Encounter (Signed)
Sending benicar to [facility] See mychart mess

## 2013-10-10 ENCOUNTER — Telehealth: Payer: Self-pay | Admitting: *Deleted

## 2013-10-10 NOTE — Telephone Encounter (Signed)
Received prior Auth forms, and placed then in your inbox

## 2013-10-13 NOTE — Telephone Encounter (Signed)
Done and in IN box  We will likely need to change her benicar to another ARB like losartan

## 2013-10-15 ENCOUNTER — Encounter: Payer: Self-pay | Admitting: Family Medicine

## 2013-10-15 NOTE — Telephone Encounter (Signed)
PA Declined, letter placed in your inbox

## 2013-10-15 NOTE — Telephone Encounter (Signed)
Aware- I sent her a message through [employer]

## 2013-10-16 ENCOUNTER — Telehealth: Payer: Self-pay | Admitting: Family Medicine

## 2013-10-16 MED ORDER — LOSARTAN POTASSIUM-HCTZ 50-12.5 MG PO TABS: 1.0000 | ORAL_TABLET | Freq: Every day | ORAL | Status: DC

## 2013-10-16 NOTE — Telephone Encounter (Signed)
Change benicar to losartan due to ins cov

## 2013-10-17 ENCOUNTER — Ambulatory Visit
Admission: RE | Admit: 2013-10-17 | Discharge: 2013-10-17 | Disposition: A | Discharge status: Home / Self Care | Payer: 59 | Source: Ambulatory Visit

## 2013-10-17 DIAGNOSIS — Z1231 Encounter for screening mammogram for malignant neoplasm of breast: Secondary | ICD-10-CM

## 2013-10-31 ENCOUNTER — Ambulatory Visit (INDEPENDENT_AMBULATORY_CARE_PROVIDER_SITE_OTHER): Payer: 59 | Admitting: Internal Medicine

## 2013-10-31 ENCOUNTER — Other Ambulatory Visit: Payer: Self-pay | Admitting: Orthopedic Surgery

## 2013-10-31 ENCOUNTER — Encounter: Payer: Self-pay | Admitting: Internal Medicine

## 2013-10-31 VITALS — BP 130/76 | HR 85 | Ht 61.0 in | Wt 204.0 lb

## 2013-10-31 DIAGNOSIS — J45998 Other asthma: Secondary | ICD-10-CM

## 2013-10-31 DIAGNOSIS — J45909 Unspecified asthma, uncomplicated: Secondary | ICD-10-CM

## 2013-10-31 DIAGNOSIS — T783XXA Angioneurotic edema, initial encounter: Secondary | ICD-10-CM

## 2013-10-31 NOTE — Progress Notes (Signed)
Patient ID: Katherine Tanner, female    DOB: 1960-11-27, 53 y.o.   MRN: [id number]  HPI 27 yoF followed here for Allergic rhinitis, Asthma, and hx of GERD.  Last here May 31, 2010. With cold air she used her proventil rescue inhaler more. Now in last 2 weeks she has noted pollen effects with chest tightness, sinus pressure headaches, itching eyes. She continues the Advair 250. Must use rescue inhaler with exercise, 2-3x / week. Not wheezing at night.  We discussed her lisinopril/ ACE inhibitor.   05/02/11- 21 yoF followed here for Allergic rhinitis, Asthma, and hx of GERD.  She felt comfortable through the summer. In recent days has noted some itching of eyes and ears blamed on wind and fallen leaves. Needed to use her rescue inhaler today. Asks we sign a form confirming intolerance of flu vaccine for her employer. She required steroids for wheezing triggered by a flu vaccine in the past.  10/31/11- 25 yoF followed here for Allergic rhinitis, Asthma, and hx of GERD.  Likes Allegra better than Claritin this time of year, but feels very well controlled.  No problems with asthma. She is pleased with status.  05/01/12- 51yoF never smoker followed here for Allergic rhinitis, Asthma, and hx of GERD. Nasal area feels full-started on Sudafed and has been around sick coworkers. Breathing overall doing well.  Does not think she has a cold. Using Flonase with Sudafed. She declines flu vaccine because she feels she is "allergic", saying that anytime she takes it it makes her sick. She has a Neti pot which she has not been using. No wheezing and no recent reflux symptoms.  10/30/12- 17yoF never smoker followed here for Allergic rhinitis, Asthma, and hx of GERD Follows for: having some nasal congestion having issues with all the pollen. some sob set off by smells.  Says touching pollen on her car caused a contact rash. Close make her chest tight. Not active wheezing. Continues lisinopril which  we discussed again. Using Claritin twice daily. Rescue inhaler 2 times last month.  05/02/13- 52yoF never smoker followed here for Allergic rhinitis, Asthma, and hx of GERD FOLLOWS FOR: last week had a rough time-had to use inhalers; this week she is feeling better. She declines flu vaccine "allergic" Increased wheeze x2 weeks, it needing more rescue inhaler. Shortness of breath, wheeze and cough. Has continued Dulera. This week she feels much better, back at baseline. Eyes itch some, no nasal symptoms.  10/31/13- 53yoF never smoker followed here for Allergic rhinitis, Asthma, and hx of GERD FOLLOWS FOR:  slight increase sob due to change in weather.  no concerns today Pending right total knee replacement with Dr. Berenice Primas in May. Breathing quality varies. No sustained problem and medicines are adequate. Doesn't recognize much pollen problem and does not feel she has any infection. Insect sting on lip last year-wasp-swelling of the face. No previous history. She decided to wait on evaluation her EpiPen. We discussed avoidance of stinging insects.  Review of Systems-Per HPI.  Constitutional:   No-   weight loss, night sweats, fevers, chills, fatigue, lassitude. HEENT:   No-  headaches, difficulty swallowing, tooth/dental problems, sore throat,       No-sneezing,+ itching, no-ear ache, nasal congestion, no- post nasal drip,  CV:  No-   chest pain, orthopnea, PND, swelling in lower extremities, anasarca, dizziness, palpitations Resp: No-   shortness of breath with exertion or at rest.  No-   productive cough,  No- non-productive cough,  No- coughing up of blood.              No-   change in color of mucus. No- wheezing.   Skin: + rash GI:  No-   heartburn, indigestion, abdominal pain, nausea, vomiting,  GU: . MS:  No-   joint pain or swelling.  . Neuro-     nothing unusual Psych:  No- change in mood or affect. No depression or anxiety.  No memory loss.  Objective:   Physical  Exam General- Alert, Oriented, Affect-appropriate, Distress- none acute; overweight Skin- rash-none, lesions- none, excoriation- none Lymphadenopathy- none Head- atraumatic            Eyes- Gross vision intact, PERRLA, conjunctivae clear, no discolored secretions            Ears- Hearing, canals-normal            Nose-+ minor sniffing, no-Septal dev, mucus, polyps, erosion, perforation             Throat- Mallampati III , mucosa clear , drainage- none, tonsils- atrophic Neck- flexible , trachea midline, no stridor , thyroid nl, carotid no bruit Chest - symmetrical excursion , unlabored           Heart/CV- RRR , no murmur , no gallop  , no rub, nl s1 s2                           - JVD- none , edema- none, stasis changes- none, varices- none           Lung- clear to P&A, wheeze- none, cough- none , dullness-none, rub- none           Chest wall-  Abd- tender-no,  Br/ Gen/ Rectal- Not done, not indicated Extrem- cyanosis- none, clubbing, none, atrophy- none, strength- nl Neuro- grossly intact to observation

## 2013-10-31 NOTE — Patient Instructions (Signed)
We can continue present meds  If you get stung again, take an antihistamine like benadryl immediately  Please call if needed

## 2013-11-04 ENCOUNTER — Encounter (HOSPITAL_COMMUNITY): Payer: Self-pay | Admitting: Pharmacy Technician

## 2013-11-05 ENCOUNTER — Encounter: Payer: Self-pay | Admitting: Internal Medicine

## 2013-11-06 MED ORDER — PREDNISONE 20 MG PO TABS: 20.0000 mg | ORAL_TABLET | Freq: Every day | ORAL | Status: DC

## 2013-11-06 NOTE — Telephone Encounter (Signed)
Offer prednisone 20 mg, # 5, 1 daily 

## 2013-11-06 NOTE — Addendum Note (Signed)
Addended by: Inge Rise on: 11/06/2013 04:24 PM   Modules accepted: Orders

## 2013-11-06 NOTE — Telephone Encounter (Signed)
Allergies  Allergen Reactions  . Ace Inhibitors     Cough/ breathing problems   . Amoxicillin-Pot Clavulanate     REACTION: swelling  . Influenza Virus Vacc Split Pf    Current Outpatient Prescriptions on File Prior to Visit  Medication Sig Dispense Refill  . albuterol (PROVENTIL HFA;VENTOLIN HFA) 108 (90 BASE) MCG/ACT inhaler Inhale 2 puffs into the lungs every 6 (six) hours as needed for wheezing or shortness of breath.      . fluticasone (FLONASE) 50 MCG/ACT nasal spray Place 2 sprays into both nostrils daily.  48 g  3  . loratadine (CLARITIN) 10 MG tablet Take 10 mg by mouth daily as needed for allergies.       [name] losartan-hydrochlorothiazide (HYZAAR) 50-12.5 MG per tablet Take 1 tablet by mouth daily.  30 tablet  5  . mometasone-formoterol (DULERA) 100-5 MCG/ACT AERO Inhale 2 puffs into the lungs 2 (two) times daily.  3 Inhaler  3  . montelukast (SINGULAIR) 10 MG tablet Take 1 tablet (10 mg total) by mouth at bedtime.  90 tablet  3  . Multiple Vitamin (MULTIVITAMIN PO) Take 1 tablet by mouth daily.       [name] oxyCODONE-acetaminophen (PERCOCET/ROXICET) 5-325 MG per tablet Take 1 tablet by mouth every 8 (eight) hours as needed for severe pain.       . pseudoephedrine (SUDAFED) 30 MG tablet Take 30 mg by mouth every 4 (four) hours as needed for congestion.       . traMADol (ULTRAM) 50 MG tablet Take 50-100 mg by mouth every 6 (six) hours as needed for moderate pain.        No current facility-administered medications on file prior to visit.   CY please advise. Thanks.

## 2013-11-07 ENCOUNTER — Encounter (HOSPITAL_COMMUNITY): Payer: Self-pay

## 2013-11-07 ENCOUNTER — Ambulatory Visit (HOSPITAL_COMMUNITY)
Admission: RE | Admit: 2013-11-07 | Discharge: 2013-11-07 | Disposition: A | Discharge status: Home / Self Care | Payer: 59 | Source: Ambulatory Visit | Attending: Orthopedic Surgery | Admitting: Orthopedic Surgery

## 2013-11-07 ENCOUNTER — Encounter (HOSPITAL_COMMUNITY)
Admission: RE | Admit: 2013-11-07 | Discharge: 2013-11-07 | Disposition: A | Discharge status: Home / Self Care | Payer: 59 | Source: Ambulatory Visit | Attending: Orthopedic Surgery | Admitting: Orthopedic Surgery

## 2013-11-07 DIAGNOSIS — Z01812 Encounter for preprocedural laboratory examination: Secondary | ICD-10-CM | POA: Insufficient documentation

## 2013-11-07 DIAGNOSIS — Z01818 Encounter for other preprocedural examination: Secondary | ICD-10-CM | POA: Insufficient documentation

## 2013-11-07 DIAGNOSIS — Z01810 Encounter for preprocedural cardiovascular examination: Secondary | ICD-10-CM | POA: Insufficient documentation

## 2013-11-07 HISTORY — DX: Nausea with vomiting, unspecified: R11.2

## 2013-11-07 HISTORY — DX: Pneumonia, unspecified organism: J18.9

## 2013-11-07 HISTORY — DX: Personal history of other infectious and parasitic diseases: Z86.19

## 2013-11-07 HISTORY — DX: Unspecified osteoarthritis, unspecified site: M19.90

## 2013-11-07 HISTORY — DX: Pain in unspecified joint: M25.50

## 2013-11-07 HISTORY — DX: Effusion, unspecified joint: M25.40

## 2013-11-07 HISTORY — DX: Personal history of colon polyps, unspecified: Z86.0100

## 2013-11-07 HISTORY — DX: Personal history of other diseases of the digestive system: Z87.19

## 2013-11-07 HISTORY — DX: Personal history of colonic polyps: Z86.010

## 2013-11-07 HISTORY — DX: Personal history of other diseases of the respiratory system: Z87.09

## 2013-11-07 HISTORY — DX: Other specified postprocedural states: Z98.890

## 2013-11-07 LAB — TYPE AND SCREEN
ABO/RH(D): O POS
ANTIBODY SCREEN: NEGATIVE

## 2013-11-07 LAB — COMPREHENSIVE METABOLIC PANEL
ALK PHOS: 195 U/L — AB (ref 39–117)
ALT: 40 U/L — ABNORMAL HIGH (ref 0–35)
AST: 26 U/L (ref 0–37)
Albumin: 3.7 g/dL (ref 3.5–5.2)
BUN: 9 mg/dL (ref 6–23)
CALCIUM: 9.8 mg/dL (ref 8.4–10.5)
CO2: 26 meq/L (ref 19–32)
Chloride: 101 mEq/L (ref 96–112)
Creatinine, Ser: 0.58 mg/dL (ref 0.50–1.10)
GFR calc Af Amer: >90 mL/min (ref >90)
GFR calc non Af Amer: >90 mL/min (ref >90)
Glucose, Bld: 121 mg/dL — ABNORMAL HIGH (ref 70–99)
POTASSIUM: 4 meq/L (ref 3.7–5.3)
SODIUM: 141 meq/L (ref 137–147)
Total Bilirubin: <0.2 mg/dL — ABNORMAL LOW (ref 0.3–1.2)
Total Protein: 8 g/dL (ref 6.0–8.3)

## 2013-11-07 LAB — PROTIME-INR
INR: 0.97 (ref 0.00–1.49)
Prothrombin Time: 12.7 seconds (ref 11.6–15.2)

## 2013-11-07 LAB — SURGICAL PCR SCREEN
MRSA, PCR: NEGATIVE
STAPHYLOCOCCUS AUREUS: NEGATIVE

## 2013-11-07 LAB — URINALYSIS, ROUTINE W REFLEX MICROSCOPIC
Glucose, UA: NEGATIVE mg/dL
Ketones, ur: 15 mg/dL — AB
Nitrite: POSITIVE — AB
PROTEIN: 100 mg/dL — AB
SPECIFIC GRAVITY, URINE: 1.026 (ref 1.005–1.030)
UROBILINOGEN UA: 1 mg/dL (ref 0.0–1.0)
pH: 5.5 (ref 5.0–8.0)

## 2013-11-07 LAB — URINE MICROSCOPIC-ADD ON

## 2013-11-07 LAB — CBC WITH DIFFERENTIAL/PLATELET
BASOS ABS: 0 10*3/uL (ref 0.0–0.1)
Basophils Relative: 0 % (ref 0–1)
EOS PCT: 0 % (ref 0–5)
Eosinophils Absolute: 0 10*3/uL (ref 0.0–0.7)
HCT: 40.1 % (ref 36.0–46.0)
Hemoglobin: 13.4 g/dL (ref 12.0–15.0)
LYMPHS ABS: 1.8 10*3/uL (ref 0.7–4.0)
Lymphocytes Relative: 28 % (ref 12–46)
MCH: 28.8 pg (ref 26.0–34.0)
MCHC: 33.4 g/dL (ref 30.0–36.0)
MCV: 86.1 fL (ref 78.0–100.0)
Monocytes Absolute: 0.5 10*3/uL (ref 0.1–1.0)
Monocytes Relative: 8 % (ref 3–12)
NEUTROS ABS: 4.1 10*3/uL (ref 1.7–7.7)
NEUTROS PCT: 64 % (ref 43–77)
PLATELETS: 288 10*3/uL (ref 150–400)
RBC: 4.66 MIL/uL (ref 3.87–5.11)
RDW: 14.1 % (ref 11.5–15.5)
WBC: 6.3 10*3/uL (ref 4.0–10.5)

## 2013-11-07 LAB — APTT: aPTT: 31 seconds (ref 24–37)

## 2013-11-07 LAB — HCG, SERUM, QUALITATIVE: PREG SERUM: NEGATIVE

## 2013-11-07 LAB — ABO/RH: ABO/RH(D): O POS

## 2013-11-07 MED ORDER — CHLORHEXIDINE GLUCONATE 4 % EX LIQD
60.0000 mL | Freq: Once | CUTANEOUS | Status: DC
Start: 2013-11-07 — End: 2013-11-08

## 2013-11-07 NOTE — Progress Notes (Signed)
11/07/13 1505  OBSTRUCTIVE SLEEP APNEA  Have you ever been diagnosed with sleep apnea through a sleep study? No  Do you snore loudly (loud enough to be heard through closed doors)?  1  Do you often feel tired, fatigued, or sleepy during the daytime? 0  Has anyone observed you stop breathing during your sleep? 1  Do you have, or are you being treated for high blood pressure? 1  BMI more than 35 kg/m2? 1  Age over 51 years old? 1  Neck circumference greater than 40 cm/16 inches? 0 (15)  Gender: 0  Obstructive Sleep Apnea Score 5  Score 4 or greater  Results sent to PCP

## 2013-11-07 NOTE — Progress Notes (Signed)
Pt doesn't have a cardiologist  Heart cath done in 2003-states she had been having SOB and pressure in chest which was negative but Asthma diagnosed  Denies ever having an echo/stress test  Denies EKG or CXR in past yr  Medical Md is Dr.Marne tower  Pulmonologist is Dr.Young with last visit on the 30th of APril

## 2013-11-07 NOTE — Pre-Procedure Instructions (Signed)
Katherine Tanner  11/07/2013   Your procedure is scheduled on:  Fri, May 15 @ 7:30 AM  Report to [facility]  at 5:30 AM.  Call this number if you have problems the morning of surgery: [phone]   Remember:   Do not eat food or drink liquids after midnight.   Take these medicines the morning of surgery with A SIP OF WATER: Albuterol<Bring Your Inhaler With You>,Flonase(Fluticasone),Loratadine(Claritin),Dulera(Mometasone-Formoterol),Pain Pill(if needed),Prednisone(Deltasone),and Tramadol(Ultram-if needed)               No Goody's,BC's,Aleve,Ibuprofen,Fish Oil,or any Herbal Medications   Do not wear jewelry, make-up or nail polish.  Do not wear lotions, powders, or perfumes. You may wear deodorant.  Do not shave 48 hours prior to surgery.   Do not bring valuables to the hospital.  [facility] is not responsible                  for any belongings or valuables.               Contacts, dentures or bridgework may not be worn into surgery.  Leave suitcase in the car. After surgery it may be brought to your room.  For patients admitted to the hospital, discharge time is determined by your                treatment team.                 Special Instructions:   - Preparing for Surgery  Before surgery, you can play an important role.  Because skin is not sterile, your skin needs to be as free of germs as possible.  You can reduce the number of germs on you skin by washing with CHG (chlorahexidine gluconate) soap before surgery.  CHG is an antiseptic cleaner which kills germs and bonds with the skin to continue killing germs even after washing.  Please DO NOT use if you have an allergy to CHG or antibacterial soaps.  If your skin becomes reddened/irritated stop using the CHG and inform your nurse when you arrive at Short Stay.  Do not shave (including legs and underarms) for at least 48 hours prior to the first CHG shower.  You may shave your face.  Please follow  these instructions carefully:   1.  Shower with CHG Soap the night before surgery and the                                morning of Surgery.  2.  If you choose to wash your hair, wash your hair first as usual with your       normal shampoo.  3.  After you shampoo, rinse your hair and body thoroughly to remove the                      Shampoo.  4.  Use CHG as you would any other liquid soap.  You can apply chg directly       to the skin and wash gently with scrungie or a clean washcloth.  5.  Apply the CHG Soap to your body ONLY FROM THE NECK DOWN.        Do not use on open wounds or open sores.  Avoid contact with your eyes,       ears, mouth and genitals (private parts).  Wash genitals (private parts)  with your normal soap.  6.  Wash thoroughly, paying special attention to the area where your surgery        will be performed.  7.  Thoroughly rinse your body with warm water from the neck down.  8.  DO NOT shower/wash with your normal soap after using and rinsing off       the CHG Soap.  9.  Pat yourself dry with a clean towel.            10.  Wear clean pajamas.            11.  Place clean sheets on your bed the night of your first shower and do not        sleep with pets.  Day of Surgery  Do not apply any lotions/deoderants the morning of surgery.  Please wear clean clothes to the hospital/surgery center.     Please read over the following fact sheets that you were given: Pain Booklet, Coughing and Deep Breathing, Blood Transfusion Information, MRSA Information and Surgical Site Infection Prevention

## 2013-11-08 NOTE — Progress Notes (Signed)
Left a message with Pamala Hurry at [facility] office to have him look at UA results;she states she will send him a message to have him view

## 2013-11-14 MED ORDER — CLINDAMYCIN PHOSPHATE 900 MG/50ML IV SOLN
900.0000 mg | INTRAVENOUS | Status: AC
  Administered 2013-11-15: 900 mg via INTRAVENOUS
  Filled 2013-11-14: qty 50

## 2013-11-15 ENCOUNTER — Encounter (HOSPITAL_COMMUNITY): Payer: Self-pay | Admitting: *Deleted

## 2013-11-15 ENCOUNTER — Encounter (HOSPITAL_COMMUNITY)
Admission: RE | Disposition: A | Discharge status: Home Health | Payer: Self-pay | Source: Ambulatory Visit | Attending: Orthopedic Surgery

## 2013-11-15 ENCOUNTER — Inpatient Hospital Stay (HOSPITAL_COMMUNITY)
Admission: RE | Admit: 2013-11-15 | Discharge: 2013-11-17 | DRG: 470 | Disposition: A | Discharge status: Home Health | Payer: 59 | Source: Ambulatory Visit | Attending: Orthopedic Surgery | Admitting: Orthopedic Surgery

## 2013-11-15 ENCOUNTER — Encounter (HOSPITAL_COMMUNITY): Payer: 59 | Admitting: Certified Registered"

## 2013-11-15 ENCOUNTER — Inpatient Hospital Stay (HOSPITAL_COMMUNITY): Payer: 59 | Admitting: Certified Registered"

## 2013-11-15 DIAGNOSIS — I10 Essential (primary) hypertension: Secondary | ICD-10-CM | POA: Diagnosis present

## 2013-11-15 DIAGNOSIS — Z83511 Family history of glaucoma: Secondary | ICD-10-CM

## 2013-11-15 DIAGNOSIS — Z79899 Other long term (current) drug therapy: Secondary | ICD-10-CM

## 2013-11-15 DIAGNOSIS — E785 Hyperlipidemia, unspecified: Secondary | ICD-10-CM | POA: Diagnosis present

## 2013-11-15 DIAGNOSIS — J45909 Unspecified asthma, uncomplicated: Secondary | ICD-10-CM | POA: Diagnosis present

## 2013-11-15 DIAGNOSIS — M1710 Unilateral primary osteoarthritis, unspecified knee: Principal | ICD-10-CM | POA: Diagnosis present

## 2013-11-15 DIAGNOSIS — M1712 Unilateral primary osteoarthritis, left knee: Secondary | ICD-10-CM | POA: Diagnosis present

## 2013-11-15 DIAGNOSIS — Z860100 Personal history of colon polyps, unspecified: Secondary | ICD-10-CM

## 2013-11-15 DIAGNOSIS — M1711 Unilateral primary osteoarthritis, right knee: Secondary | ICD-10-CM | POA: Diagnosis present

## 2013-11-15 DIAGNOSIS — Z8249 Family history of ischemic heart disease and other diseases of the circulatory system: Secondary | ICD-10-CM

## 2013-11-15 DIAGNOSIS — Z833 Family history of diabetes mellitus: Secondary | ICD-10-CM

## 2013-11-15 DIAGNOSIS — K219 Gastro-esophageal reflux disease without esophagitis: Secondary | ICD-10-CM | POA: Diagnosis present

## 2013-11-15 DIAGNOSIS — Z7982 Long term (current) use of aspirin: Secondary | ICD-10-CM

## 2013-11-15 DIAGNOSIS — Z9851 Tubal ligation status: Secondary | ICD-10-CM

## 2013-11-15 DIAGNOSIS — K449 Diaphragmatic hernia without obstruction or gangrene: Secondary | ICD-10-CM | POA: Diagnosis present

## 2013-11-15 DIAGNOSIS — IMO0002 Reserved for concepts with insufficient information to code with codable children: Secondary | ICD-10-CM

## 2013-11-15 HISTORY — PX: TOTAL KNEE ARTHROPLASTY: SHX125

## 2013-11-15 HISTORY — PX: STERIOD INJECTION: SHX5046

## 2013-11-15 SURGERY — ARTHROPLASTY, KNEE, TOTAL
Anesthesia: General | Site: Knee | Laterality: Right

## 2013-11-15 MED ORDER — OXYCODONE HCL 5 MG PO TABS
5.0000 mg | ORAL_TABLET | Freq: Once | ORAL | Status: AC | PRN
  Administered 2013-11-15: 5 mg via ORAL

## 2013-11-15 MED ORDER — ONDANSETRON HCL 4 MG PO TABS: 4.0000 mg | ORAL_TABLET | Freq: Four times a day (QID) | ORAL | Status: DC | PRN

## 2013-11-15 MED ORDER — ACETAMINOPHEN 650 MG RE SUPP: 650.0000 mg | Freq: Four times a day (QID) | RECTAL | Status: DC | PRN

## 2013-11-15 MED ORDER — METHOCARBAMOL 750 MG PO TABS: 750.0000 mg | ORAL_TABLET | Freq: Three times a day (TID) | ORAL | Status: DC | PRN

## 2013-11-15 MED ORDER — BISACODYL 5 MG PO TBEC: 5.0000 mg | DELAYED_RELEASE_TABLET | Freq: Every day | ORAL | Status: DC | PRN

## 2013-11-15 MED ORDER — MIDAZOLAM HCL 5 MG/5ML IJ SOLN
INTRAMUSCULAR | Status: DC | PRN
  Administered 2013-11-15: 2 mg via INTRAVENOUS

## 2013-11-15 MED ORDER — FENTANYL CITRATE 0.05 MG/ML IJ SOLN
INTRAMUSCULAR | Status: DC | PRN
  Administered 2013-11-15 (×3): 50 ug via INTRAVENOUS

## 2013-11-15 MED ORDER — METHOCARBAMOL 500 MG PO TABS
500.0000 mg | ORAL_TABLET | Freq: Four times a day (QID) | ORAL | Status: DC | PRN
  Administered 2013-11-15 – 2013-11-17 (×7): 500 mg via ORAL
  Filled 2013-11-15 (×6): qty 1

## 2013-11-15 MED ORDER — MIDAZOLAM HCL 2 MG/2ML IJ SOLN
INTRAMUSCULAR | Status: AC
  Filled 2013-11-15: qty 2

## 2013-11-15 MED ORDER — LOSARTAN POTASSIUM 50 MG PO TABS
50.0000 mg | ORAL_TABLET | Freq: Every day | ORAL | Status: DC
  Administered 2013-11-16 – 2013-11-17 (×2): 50 mg via ORAL
  Filled 2013-11-15 (×3): qty 1

## 2013-11-15 MED ORDER — BUPIVACAINE HCL 0.5 % IJ SOLN
INTRAMUSCULAR | Status: DC | PRN
  Administered 2013-11-15: 8 mL

## 2013-11-15 MED ORDER — METHOCARBAMOL 500 MG PO TABS
ORAL_TABLET | ORAL | Status: AC
  Administered 2013-11-15: 11:00:00
  Filled 2013-11-15: qty 1

## 2013-11-15 MED ORDER — OXYCODONE HCL 5 MG/5ML PO SOLN: 5.0000 mg | Freq: Once | ORAL | Status: AC | PRN

## 2013-11-15 MED ORDER — BUPIVACAINE HCL (PF) 0.75 % IJ SOLN
INTRAMUSCULAR | Status: DC | PRN
  Administered 2013-11-15: 12 mg

## 2013-11-15 MED ORDER — PROMETHAZINE HCL 25 MG/ML IJ SOLN: 6.2500 mg | INTRAMUSCULAR | Status: DC | PRN

## 2013-11-15 MED ORDER — BUPIVACAINE HCL (PF) 0.5 % IJ SOLN
INTRAMUSCULAR | Status: AC
  Filled 2013-11-15: qty 10

## 2013-11-15 MED ORDER — MOMETASONE FURO-FORMOTEROL FUM 100-5 MCG/ACT IN AERO
2.0000 | INHALATION_SPRAY | Freq: Two times a day (BID) | RESPIRATORY_TRACT | Status: DC
  Administered 2013-11-15 – 2013-11-17 (×4): 2 via RESPIRATORY_TRACT
  Filled 2013-11-15: qty 8.8

## 2013-11-15 MED ORDER — TRANEXAMIC ACID 100 MG/ML IV SOLN
1000.0000 mg | INTRAVENOUS | Status: DC
  Filled 2013-11-15: qty 10

## 2013-11-15 MED ORDER — PROMETHAZINE HCL 25 MG/ML IJ SOLN: 12.5000 mg | Freq: Four times a day (QID) | INTRAMUSCULAR | Status: DC | PRN

## 2013-11-15 MED ORDER — OXYCODONE-ACETAMINOPHEN 5-325 MG PO TABS
1.0000 | ORAL_TABLET | ORAL | Status: DC | PRN
  Administered 2013-11-15 (×3): 1 via ORAL
  Administered 2013-11-15 – 2013-11-17 (×9): 2 via ORAL
  Filled 2013-11-15 (×4): qty 2
  Filled 2013-11-15: qty 1
  Filled 2013-11-15 (×4): qty 2
  Filled 2013-11-15: qty 1
  Filled 2013-11-15: qty 2
  Filled 2013-11-15: qty 1

## 2013-11-15 MED ORDER — BUPIVACAINE LIPOSOME 1.3 % IJ SUSP
INTRAMUSCULAR | Status: DC | PRN
  Administered 2013-11-15: 20 mL

## 2013-11-15 MED ORDER — DIPHENHYDRAMINE HCL 12.5 MG/5ML PO ELIX: 12.5000 mg | ORAL_SOLUTION | ORAL | Status: DC | PRN

## 2013-11-15 MED ORDER — ALBUTEROL SULFATE (2.5 MG/3ML) 0.083% IN NEBU: 2.5000 mg | INHALATION_SOLUTION | Freq: Four times a day (QID) | RESPIRATORY_TRACT | Status: DC | PRN

## 2013-11-15 MED ORDER — OXYCODONE HCL 5 MG PO TABS
ORAL_TABLET | ORAL | Status: AC
  Administered 2013-11-15: 11:00:00
  Filled 2013-11-15: qty 1

## 2013-11-15 MED ORDER — ONDANSETRON HCL 4 MG/2ML IJ SOLN
INTRAMUSCULAR | Status: DC | PRN
  Administered 2013-11-15: 4 mg via INTRAVENOUS

## 2013-11-15 MED ORDER — ACETAMINOPHEN 325 MG PO TABS: 650.0000 mg | ORAL_TABLET | Freq: Four times a day (QID) | ORAL | Status: DC | PRN

## 2013-11-15 MED ORDER — FLUTICASONE PROPIONATE 50 MCG/ACT NA SUSP
2.0000 | Freq: Every day | NASAL | Status: DC
Start: 2013-11-16 — End: 2013-11-17
  Administered 2013-11-16 – 2013-11-17 (×2): 2 via NASAL
  Filled 2013-11-15: qty 16

## 2013-11-15 MED ORDER — LOSARTAN POTASSIUM-HCTZ 50-12.5 MG PO TABS: 1.0000 | ORAL_TABLET | Freq: Every day | ORAL | Status: DC

## 2013-11-15 MED ORDER — SODIUM CHLORIDE 0.9 % IJ SOLN
INTRAMUSCULAR | Status: DC | PRN
  Administered 2013-11-15: 40 mL via INTRAVENOUS

## 2013-11-15 MED ORDER — METHYLPREDNISOLONE ACETATE 80 MG/ML IJ SUSP
INTRAMUSCULAR | Status: AC
  Filled 2013-11-15: qty 2

## 2013-11-15 MED ORDER — LIDOCAINE HCL (CARDIAC) 20 MG/ML IV SOLN
INTRAVENOUS | Status: AC
  Filled 2013-11-15: qty 5

## 2013-11-15 MED ORDER — BUPIVACAINE-EPINEPHRINE (PF) 0.5% -1:200000 IJ SOLN
INTRAMUSCULAR | Status: DC | PRN
  Administered 2013-11-15: 30 mL

## 2013-11-15 MED ORDER — ONDANSETRON HCL 4 MG/2ML IJ SOLN: 4.0000 mg | Freq: Four times a day (QID) | INTRAMUSCULAR | Status: DC | PRN

## 2013-11-15 MED ORDER — PREDNISONE 20 MG PO TABS
20.0000 mg | ORAL_TABLET | Freq: Every day | ORAL | Status: DC
  Administered 2013-11-16 – 2013-11-17 (×2): 20 mg via ORAL
  Filled 2013-11-15 (×3): qty 1

## 2013-11-15 MED ORDER — ASPIRIN EC 325 MG PO TBEC
325.0000 mg | DELAYED_RELEASE_TABLET | Freq: Two times a day (BID) | ORAL | Status: DC
  Administered 2013-11-15 – 2013-11-17 (×4): 325 mg via ORAL
  Filled 2013-11-15 (×6): qty 1

## 2013-11-15 MED ORDER — DEXTROSE 5 % IV SOLN
500.0000 mg | Freq: Four times a day (QID) | INTRAVENOUS | Status: DC | PRN
  Filled 2013-11-15: qty 5

## 2013-11-15 MED ORDER — LACTATED RINGERS IV SOLN
INTRAVENOUS | Status: DC | PRN
  Administered 2013-11-15 (×2): via INTRAVENOUS

## 2013-11-15 MED ORDER — ARTIFICIAL TEARS OP OINT
TOPICAL_OINTMENT | OPHTHALMIC | Status: AC
  Filled 2013-11-15: qty 3.5

## 2013-11-15 MED ORDER — HYDROCHLOROTHIAZIDE 12.5 MG PO CAPS
12.5000 mg | ORAL_CAPSULE | Freq: Every day | ORAL | Status: DC
  Administered 2013-11-16 – 2013-11-17 (×2): 12.5 mg via ORAL
  Filled 2013-11-15 (×3): qty 1

## 2013-11-15 MED ORDER — HYDROMORPHONE HCL PF 1 MG/ML IJ SOLN
0.2500 mg | INTRAMUSCULAR | Status: DC | PRN
  Administered 2013-11-15 (×2): 0.5 mg via INTRAVENOUS

## 2013-11-15 MED ORDER — HYDROMORPHONE HCL PF 1 MG/ML IJ SOLN
INTRAMUSCULAR | Status: AC
  Administered 2013-11-15: 11:00:00
  Filled 2013-11-15: qty 1

## 2013-11-15 MED ORDER — CLINDAMYCIN PHOSPHATE 600 MG/50ML IV SOLN
600.0000 mg | Freq: Four times a day (QID) | INTRAVENOUS | Status: AC
  Administered 2013-11-15 (×2): 600 mg via INTRAVENOUS
  Filled 2013-11-15 (×3): qty 50

## 2013-11-15 MED ORDER — ONDANSETRON HCL 4 MG/2ML IJ SOLN
INTRAMUSCULAR | Status: AC
  Filled 2013-11-15: qty 2

## 2013-11-15 MED ORDER — SODIUM CHLORIDE 0.9 % IR SOLN
Status: DC | PRN
  Administered 2013-11-15: 4000 mL

## 2013-11-15 MED ORDER — METHYLPREDNISOLONE ACETATE 80 MG/ML IJ SUSP
INTRAMUSCULAR | Status: DC | PRN
  Administered 2013-11-15: 160 mg

## 2013-11-15 MED ORDER — DEXAMETHASONE SODIUM PHOSPHATE 10 MG/ML IJ SOLN
10.0000 mg | Freq: Three times a day (TID) | INTRAMUSCULAR | Status: AC
  Filled 2013-11-15 (×3): qty 1

## 2013-11-15 MED ORDER — DEXAMETHASONE 6 MG PO TABS
10.0000 mg | ORAL_TABLET | Freq: Three times a day (TID) | ORAL | Status: AC
  Administered 2013-11-15 – 2013-11-16 (×3): 10 mg via ORAL
  Filled 2013-11-15 (×3): qty 1

## 2013-11-15 MED ORDER — HYDROMORPHONE HCL PF 1 MG/ML IJ SOLN: 1.0000 mg | INTRAMUSCULAR | Status: DC | PRN

## 2013-11-15 MED ORDER — TRANEXAMIC ACID 100 MG/ML IV SOLN
1000.0000 mg | INTRAVENOUS | Status: DC | PRN
  Administered 2013-11-15: 1000 mg via INTRAVENOUS

## 2013-11-15 MED ORDER — ALUM & MAG HYDROXIDE-SIMETH 200-200-20 MG/5ML PO SUSP: 30.0000 mL | ORAL | Status: DC | PRN

## 2013-11-15 MED ORDER — FENTANYL CITRATE 0.05 MG/ML IJ SOLN
INTRAMUSCULAR | Status: AC
  Filled 2013-11-15: qty 5

## 2013-11-15 MED ORDER — ASPIRIN EC 325 MG PO TBEC: 325.0000 mg | DELAYED_RELEASE_TABLET | Freq: Two times a day (BID) | ORAL | Status: DC

## 2013-11-15 MED ORDER — PROPOFOL 10 MG/ML IV BOLUS
INTRAVENOUS | Status: AC
  Filled 2013-11-15: qty 20

## 2013-11-15 MED ORDER — GUAIFENESIN ER 600 MG PO TB12
600.0000 mg | ORAL_TABLET | Freq: Two times a day (BID) | ORAL | Status: DC
  Administered 2013-11-15 – 2013-11-17 (×4): 600 mg via ORAL
  Filled 2013-11-15 (×6): qty 1

## 2013-11-15 MED ORDER — ROCURONIUM BROMIDE 50 MG/5ML IV SOLN
INTRAVENOUS | Status: AC
  Filled 2013-11-15: qty 1

## 2013-11-15 MED ORDER — DOCUSATE SODIUM 100 MG PO CAPS
100.0000 mg | ORAL_CAPSULE | Freq: Two times a day (BID) | ORAL | Status: DC
  Administered 2013-11-15 – 2013-11-17 (×4): 100 mg via ORAL
  Filled 2013-11-15 (×4): qty 1

## 2013-11-15 MED ORDER — ALBUTEROL SULFATE HFA 108 (90 BASE) MCG/ACT IN AERS: 2.0000 | INHALATION_SPRAY | Freq: Four times a day (QID) | RESPIRATORY_TRACT | Status: DC | PRN

## 2013-11-15 MED ORDER — POLYETHYLENE GLYCOL 3350 17 G PO PACK: 17.0000 g | PACK | Freq: Every day | ORAL | Status: DC | PRN

## 2013-11-15 MED ORDER — MONTELUKAST SODIUM 10 MG PO TABS
10.0000 mg | ORAL_TABLET | Freq: Every day | ORAL | Status: DC
  Administered 2013-11-15 – 2013-11-16 (×2): 10 mg via ORAL
  Filled 2013-11-15 (×3): qty 1

## 2013-11-15 MED ORDER — SODIUM CHLORIDE 0.9 % IV SOLN
INTRAVENOUS | Status: DC
  Administered 2013-11-16: 04:00:00 via INTRAVENOUS

## 2013-11-15 MED ORDER — OXYCODONE-ACETAMINOPHEN 5-325 MG PO TABS: 1.0000 | ORAL_TABLET | Freq: Four times a day (QID) | ORAL | Status: DC | PRN

## 2013-11-15 MED ORDER — PROPOFOL 10 MG/ML IV BOLUS
INTRAVENOUS | Status: DC | PRN
  Administered 2013-11-15 (×2): 20 mg via INTRAVENOUS
  Administered 2013-11-15 (×2): 10 mg via INTRAVENOUS
  Administered 2013-11-15 (×3): 20 mg via INTRAVENOUS
  Administered 2013-11-15: 10 mg via INTRAVENOUS
  Administered 2013-11-15: 20 mg via INTRAVENOUS
  Administered 2013-11-15 (×2): 10 mg via INTRAVENOUS
  Administered 2013-11-15: 20 mg via INTRAVENOUS
  Administered 2013-11-15 (×6): 10 mg via INTRAVENOUS
  Administered 2013-11-15: 20 mg via INTRAVENOUS
  Administered 2013-11-15: 10 mg via INTRAVENOUS

## 2013-11-15 SURGICAL SUPPLY — 58 items
BANDAGE ESMARK 6X9 LF (GAUZE/BANDAGES/DRESSINGS) ×2 IMPLANT
BENZOIN TINCTURE PRP APPL 2/3 (GAUZE/BANDAGES/DRESSINGS) ×3 IMPLANT
BLADE 10 SAFETY STRL DISP (BLADE) ×3 IMPLANT
BLADE SAGITTAL 25.0X1.19X90 (BLADE) ×3 IMPLANT
BLADE SAW SAG 90X13X1.27 (BLADE) ×3 IMPLANT
BNDG COHESIVE 4X5 WHT NS (GAUZE/BANDAGES/DRESSINGS) ×3 IMPLANT
BNDG ESMARK 6X9 LF (GAUZE/BANDAGES/DRESSINGS) ×3
BOWL SMART MIX CTS (DISPOSABLE) ×3 IMPLANT
CAPT RP KNEE ×3 IMPLANT
CEMENT HV SMART SET (Cement) ×3 IMPLANT
COVER SURGICAL LIGHT HANDLE (MISCELLANEOUS) ×3 IMPLANT
CUFF TOURNIQUET SINGLE 34IN LL (TOURNIQUET CUFF) ×3 IMPLANT
CUFF TOURNIQUET SINGLE 44IN (TOURNIQUET CUFF) IMPLANT
DRAPE EXTREMITY T 121X128X90 (DRAPE) ×3 IMPLANT
DRAPE U-SHAPE 47X51 STRL (DRAPES) ×3 IMPLANT
DRSG PAD ABDOMINAL 8X10 ST (GAUZE/BANDAGES/DRESSINGS) ×3 IMPLANT
DURAPREP 26ML APPLICATOR (WOUND CARE) ×3 IMPLANT
ELECT REM PT RETURN 9FT ADLT (ELECTROSURGICAL) ×3
ELECTRODE REM PT RTRN 9FT ADLT (ELECTROSURGICAL) ×2 IMPLANT
EVACUATOR 1/8 PVC DRAIN (DRAIN) ×3 IMPLANT
FACESHIELD WRAPAROUND (MASK) ×3 IMPLANT
GAUZE XEROFORM 5X9 LF (GAUZE/BANDAGES/DRESSINGS) ×3 IMPLANT
GLOVE BIOGEL PI IND STRL 8 (GLOVE) ×4 IMPLANT
GLOVE BIOGEL PI INDICATOR 8 (GLOVE) ×2
GLOVE ECLIPSE 7.5 STRL STRAW (GLOVE) ×6 IMPLANT
GOWN STRL REUS W/ TWL LRG LVL3 (GOWN DISPOSABLE) ×2 IMPLANT
GOWN STRL REUS W/ TWL XL LVL3 (GOWN DISPOSABLE) ×4 IMPLANT
GOWN STRL REUS W/TWL LRG LVL3 (GOWN DISPOSABLE) ×1
GOWN STRL REUS W/TWL XL LVL3 (GOWN DISPOSABLE) ×4
HANDPIECE INTERPULSE COAX TIP (DISPOSABLE) ×1
HOOD PEEL AWAY FACE SHEILD DIS (HOOD) ×9 IMPLANT
IMMOBILIZER KNEE 20 (SOFTGOODS) IMPLANT
IMMOBILIZER KNEE 22 UNIV (SOFTGOODS) ×3 IMPLANT
KIT BASIN OR (CUSTOM PROCEDURE TRAY) ×3 IMPLANT
KIT ROOM TURNOVER OR (KITS) ×3 IMPLANT
MANIFOLD NEPTUNE II (INSTRUMENTS) ×3 IMPLANT
NEEDLE HYPO 25GX1X1/2 BEV (NEEDLE) IMPLANT
NS IRRIG 1000ML POUR BTL (IV SOLUTION) ×3 IMPLANT
PACK TOTAL JOINT (CUSTOM PROCEDURE TRAY) ×3 IMPLANT
PAD ABD 8X10 STRL (GAUZE/BANDAGES/DRESSINGS) ×3 IMPLANT
PAD ARMBOARD 7.5X6 YLW CONV (MISCELLANEOUS) ×6 IMPLANT
PAD CAST 4YDX4 CTTN HI CHSV (CAST SUPPLIES) ×2 IMPLANT
PADDING CAST COTTON 4X4 STRL (CAST SUPPLIES) ×1
SET HNDPC FAN SPRY TIP SCT (DISPOSABLE) ×2 IMPLANT
SPONGE GAUZE 4X4 12PLY (GAUZE/BANDAGES/DRESSINGS) ×3 IMPLANT
STAPLER VISISTAT 35W (STAPLE) IMPLANT
STRIP CLOSURE SKIN 1/2X4 (GAUZE/BANDAGES/DRESSINGS) ×3 IMPLANT
SUCTION FRAZIER TIP 10 FR DISP (SUCTIONS) ×3 IMPLANT
SUT MNCRL AB 3-0 PS2 18 (SUTURE) ×3 IMPLANT
SUT VIC AB 0 CTB1 27 (SUTURE) ×6 IMPLANT
SUT VIC AB 1 CT1 27 (SUTURE) ×2
SUT VIC AB 1 CT1 27XBRD ANBCTR (SUTURE) ×4 IMPLANT
SUT VIC AB 2-0 CTB1 (SUTURE) ×6 IMPLANT
SYR CONTROL 10ML LL (SYRINGE) ×3 IMPLANT
TOWEL OR 17X24 6PK STRL BLUE (TOWEL DISPOSABLE) ×3 IMPLANT
TOWEL OR 17X26 10 PK STRL BLUE (TOWEL DISPOSABLE) ×3 IMPLANT
TRAY FOLEY CATH 16FRSI W/METER (SET/KITS/TRAYS/PACK) ×3 IMPLANT
WATER STERILE IRR 1000ML POUR (IV SOLUTION) ×6 IMPLANT

## 2013-11-15 NOTE — Progress Notes (Signed)
Orthopedic Tech Progress Note Patient Details:  Katherine Tanner 05/12/1961 [id number] CPM applied to RLE with appropriate settings. OHF applied to bed.  CPM Right Knee CPM Right Knee: On Right Knee Flexion (Degrees): 60 Right Knee Extension (Degrees): 0   Asia R Thompson 11/15/2013, 11:16 AM

## 2013-11-15 NOTE — H&P (Signed)
TOTAL KNEE ADMISSION H&P  Patient is being admitted for right total knee arthroplasty.  Subjective:  Chief Complaint:right knee pain.  HPI: Katherine Tanner, 53 y.o. female, has a history of pain and functional disability in the right knee due to arthritis and has failed non-surgical conservative treatments for greater than 12 weeks to includeNSAID's and/or analgesics, corticosteriod injections, viscosupplementation injections, use of assistive devices, weight reduction as appropriate and activity modification.  Onset of symptoms was gradual, starting 3 years ago with gradually worsening course since that time. The patient noted prior procedures on the knee to include  arthroscopy and menisectomy on the right knee(s).  Patient currently rates pain in the right knee(s) at 8 out of 10 with activity. Patient has night pain, worsening of pain with activity and weight bearing, pain that interferes with activities of daily living, pain with passive range of motion, crepitus and joint swelling.  Patient has evidence of subchondral sclerosis, periarticular osteophytes and joint space narrowing by imaging studies. This patient has had failure of all reasonable conservative care. There is no active infection.  Patient Active Problem List   Diagnosis Date Noted  . Hypokalemia 01/02/2012  . Alkaline phosphatase elevation 01/02/2012  . Hyperlipidemia 01/02/2012  . Leukocytosis 01/02/2012  . Routine general medical examination at a health care facility 12/09/2011  . 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 Neck pain 03/08/2011  . Low back pain 03/08/2011  . POLYARTHRITIS 01/28/2010  . ANGIOEDEMA 03/27/2007  . HYPERTENSION 03/19/2007  . Perennial allergic rhinitis with seasonal variation 03/19/2007  . Allergic-infective asthma 03/19/2007  . GERD 03/19/2007  . HIATAL HERNIA 03/19/2007  . GESTATIONAL DIABETES 03/19/2007   Past Medical History  Diagnosis Date  . Allergy     takes Claritin  daily and Flonase daily  . Asthma     uses Albuterol daily as needed;Singulair nightly;DUlera daily  . Hypertension     takes Hyzaar daily  . PONV (postoperative nausea and vomiting)   . Pneumonia     hx of;last time at least 36yr ago  . History of bronchitis     last time 232yrago  . Arthritis   . Joint pain   . Joint swelling   . H/O hiatal hernia   . GERD (gastroesophageal reflux disease)     occasionally will take OTC meds but states she just watches what she eats  . History of colon polyps   . History of shingles     Past Surgical History  Procedure Laterality Date  . Tubal ligation    . Right ganglion cyst      x 2  . Cardiac catheterization    . Knee surgery Right     arthroscopy  . Right lumpectomy  around 1983  . Colonoscopy    . Esophagogastroduodenoscopy      Prescriptions prior to admission  Medication Sig Dispense Refill  . albuterol (PROVENTIL HFA;VENTOLIN HFA) 108 (90 BASE) MCG/ACT inhaler Inhale 2 puffs into the lungs every 6 (six) hours as needed for wheezing or shortness of breath.      . fluticasone (FLONASE) 50 MCG/ACT nasal spray Place 2 sprays into both nostrils daily.  48 g  3  . guaiFENesin (MUCINEX) 600 MG 12 hr tablet Take by mouth 2 (two) times daily.      . Marland Kitchenoratadine (CLARITIN) 10 MG tablet Take 10 mg by mouth daily as needed for allergies.       . Marland Kitchenosartan-hydrochlorothiazide (HYZAAR) 50-12.5 MG per tablet Take 1 tablet by  mouth daily.  30 tablet  5  . mometasone-formoterol (DULERA) 100-5 MCG/ACT AERO Inhale 2 puffs into the lungs 2 (two) times daily.  3 Inhaler  3  . montelukast (SINGULAIR) 10 MG tablet Take 1 tablet (10 mg total) by mouth at bedtime.  90 tablet  3  . Multiple Vitamin (MULTIVITAMIN PO) Take 1 tablet by mouth daily.       . oxyCODONE-acetaminophen (PERCOCET/ROXICET) 5-325 MG per tablet Take 1 tablet by mouth every 8 (eight) hours as needed for severe pain.       . pseudoephedrine (SUDAFED) 30 MG tablet Take 30 mg by mouth every  4 (four) hours as needed for congestion.       . traMADol (ULTRAM) 50 MG tablet Take 50-100 mg by mouth every 6 (six) hours as needed for moderate pain.       . predniSONE (DELTASONE) 20 MG tablet Take 1 tablet (20 mg total) by mouth daily with breakfast.  5 tablet  0   Allergies  Allergen Reactions  . Ace Inhibitors     Cough/ breathing problems   . Amoxicillin-Pot Clavulanate     REACTION: swelling  . Influenza Virus Vacc Split Pf     History  Substance Use Topics  . Smoking status: Never Smoker   . Smokeless tobacco: Not on file  . Alcohol Use: No    Family History  Problem Relation Age of Onset  . Diabetes Father   . Sarcoidosis Mother   . Glaucoma Mother   . Hypertension Mother      ROS ROS: I have reviewed the patient's review of systems thoroughly and there are no positive responses as relates to the HPI. Objective:  Physical Exam  Vital signs in last 24 hours: Temp:  [98.6 F (37 C)] 98.6 F (37 C) (05/15 0557) Pulse Rate:  [71] 71 (05/15 0557) Resp:  [18] 18 (05/15 0557) BP: (136)/(75) 136/75 mmHg (05/15 0557) SpO2:  [100 %] 100 % (05/15 0557) Weight:  [200 lb (90.719 kg)] 200 lb (90.719 kg) (05/15 0557) Well-developed well-nourished patient in no acute distress. Alert and oriented x3 HEENT:within normal limits Cardiac: Regular rate and rhythm Pulmonary: Lungs clear to auscultation Abdomen: Soft and nontender.  Normal active bowel sounds  Musculoskeletal: Right knee: Pain with all range of motion.  Significant medial joint line tenderness.  No instability.  Grinding through range of motion.  Labs: Recent Results (from the past 2160 hour(s))  APTT     Status: None   Collection Time    11/07/13  3:37 PM      Result Value Ref Range   aPTT 31  24 - 37 seconds  CBC WITH DIFFERENTIAL     Status: None   Collection Time    11/07/13  3:37 PM      Result Value Ref Range   WBC 6.3  4.0 - 10.5 K/uL   RBC 4.66  3.87 - 5.11 MIL/uL   Hemoglobin 13.4  12.0 -  15.0 g/dL   HCT 40.1  36.0 - 46.0 %   MCV 86.1  78.0 - 100.0 fL   MCH 28.8  26.0 - 34.0 pg   MCHC 33.4  30.0 - 36.0 g/dL   RDW 14.1  11.5 - 15.5 %   Platelets 288  150 - 400 K/uL   Neutrophils Relative % 64  43 - 77 %   Neutro Abs 4.1  1.7 - 7.7 K/uL   Lymphocytes Relative 28  12 - 46 %     Lymphs Abs 1.8  0.7 - 4.0 K/uL   Monocytes Relative 8  3 - 12 %   Monocytes Absolute 0.5  0.1 - 1.0 K/uL   Eosinophils Relative 0  0 - 5 %   Eosinophils Absolute 0.0  0.0 - 0.7 K/uL   Basophils Relative 0  0 - 1 %   Basophils Absolute 0.0  0.0 - 0.1 K/uL  COMPREHENSIVE METABOLIC PANEL     Status: Abnormal   Collection Time    11/07/13  3:37 PM      Result Value Ref Range   Sodium 141  137 - 147 mEq/L   Potassium 4.0  3.7 - 5.3 mEq/L   Chloride 101  96 - 112 mEq/L   CO2 26  19 - 32 mEq/L   Glucose, Bld 121 (*) 70 - 99 mg/dL   BUN 9  6 - 23 mg/dL   Creatinine, Ser 0.58  0.50 - 1.10 mg/dL   Calcium 9.8  8.4 - 10.5 mg/dL   Total Protein 8.0  6.0 - 8.3 g/dL   Albumin 3.7  3.5 - 5.2 g/dL   AST 26  0 - 37 U/L   ALT 40 (*) 0 - 35 U/L   Alkaline Phosphatase 195 (*) 39 - 117 U/L   Total Bilirubin <0.2 (*) 0.3 - 1.2 mg/dL   GFR calc non Af Amer >90  >90 mL/min   GFR calc Af Amer >90  >90 mL/min   Comment: (NOTE)     The eGFR has been calculated using the CKD EPI equation.     This calculation has not been validated in all clinical situations.     eGFR's persistently <90 mL/min signify possible Chronic Kidney     Disease.  PROTIME-INR     Status: None   Collection Time    11/07/13  3:37 PM      Result Value Ref Range   Prothrombin Time 12.7  11.6 - 15.2 seconds   INR 0.97  0.00 - 1.49  URINALYSIS, ROUTINE W REFLEX MICROSCOPIC     Status: Abnormal   Collection Time    11/07/13  3:37 PM      Result Value Ref Range   Color, Urine RED (*) YELLOW   Comment: BIOCHEMICALS MAY BE AFFECTED BY COLOR   APPearance TURBID (*) CLEAR   Specific Gravity, Urine 1.026  1.005 - 1.030   pH 5.5  5.0 - 8.0    Glucose, UA NEGATIVE  NEGATIVE mg/dL   Hgb urine dipstick LARGE (*) NEGATIVE   Bilirubin Urine MODERATE (*) NEGATIVE   Ketones, ur 15 (*) NEGATIVE mg/dL   Protein, ur 100 (*) NEGATIVE mg/dL   Urobilinogen, UA 1.0  0.0 - 1.0 mg/dL   Nitrite POSITIVE (*) NEGATIVE   Leukocytes, UA MODERATE (*) NEGATIVE  HCG, SERUM, QUALITATIVE     Status: None   Collection Time    11/07/13  3:37 PM      Result Value Ref Range   Preg, Serum NEGATIVE  NEGATIVE   Comment:            THE SENSITIVITY OF THIS     METHODOLOGY IS >10 mIU/mL.  SURGICAL PCR SCREEN     Status: None   Collection Time    11/07/13  3:37 PM      Result Value Ref Range   MRSA, PCR NEGATIVE  NEGATIVE   Staphylococcus aureus NEGATIVE  NEGATIVE   Comment:  The Xpert SA Assay (FDA     approved for NASAL specimens     in patients over 21 years of age),     is one component of     a comprehensive surveillance     program.  Test performance has     been validated by [facility] for patients greater     than or equal to 1 year old.     It is not intended     to diagnose infection nor to     guide or monitor treatment.  URINE MICROSCOPIC-ADD ON     Status: Abnormal   Collection Time    11/07/13  3:37 PM      Result Value Ref Range   Squamous Epithelial / LPF RARE  RARE   WBC, UA 0-2  <3 WBC/hpf   RBC / HPF TOO NUMEROUS TO COUNT  <3 RBC/hpf   Bacteria, UA FEW (*) RARE  TYPE AND SCREEN     Status: None   Collection Time    11/07/13  3:43 PM      Result Value Ref Range   ABO/RH(D) O POS     Antibody Screen NEG     Sample Expiration 11/21/2013    ABO/RH     Status: None   Collection Time    11/07/13  3:43 PM      Result Value Ref Range   ABO/RH(D) O POS       Estimated body mass index is 37.81 kg/(m^2) as calculated from the following:   Height as of this encounter: 5' 1" (1.549 m).   Weight as of this encounter: 200 lb (90.719 kg).   Imaging Review Plain radiographs demonstrate moderate degenerative  joint disease of the right knee(s). The overall alignment ismild varus. The bone quality appears to be good for age and reported activity level.  Assessment/Plan:  End stage arthritis, right knee   The patient history, physical examination, clinical judgment of the provider and imaging studies are consistent with end stage degenerative joint disease of the right knee(s) and total knee arthroplasty is deemed medically necessary. The treatment options including medical management, injection therapy arthroscopy and arthroplasty were discussed at length. The risks and benefits of total knee arthroplasty were presented and reviewed. The risks due to aseptic loosening, infection, stiffness, patella tracking problems, thromboembolic complications and other imponderables were discussed. The patient acknowledged the explanation, agreed to proceed with the plan and consent was signed. Patient is being admitted for inpatient treatment for surgery, pain control, PT, OT, prophylactic antibiotics, VTE prophylaxis, progressive ambulation and ADL's and discharge planning. The patient is planning to be discharged home with home health services   

## 2013-11-15 NOTE — Progress Notes (Signed)
Unable to remove Ring to left ring finger, CRNA informed.

## 2013-11-15 NOTE — Transfer of Care (Signed)
Immediate Anesthesia Transfer of Care Note  Patient: Katherine Tanner  Procedure(s) Performed: Procedure(s): RIGHT TOTAL KNEE ARTHROPLASTY (Right) Marcaine/STEROID INJECTION (Left)  Patient Location: PACU  Anesthesia Type:Spinal  Level of Consciousness: awake, alert  and oriented  Airway & Oxygen Therapy: Patient Spontanous Breathing and Patient connected to nasal cannula oxygen  Post-op Assessment: Report given to PACU RN and Post -op Vital signs reviewed and stable  Post vital signs: Reviewed and stable  Complications: No apparent anesthesia complications

## 2013-11-15 NOTE — Discharge Instructions (Signed)
Total Knee Replacement Care After Refer to this sheet in the next few weeks. These instructions provide you with information on caring for yourself after your procedure. Your caregiver also may give you specific instructions. Your treatment has been planned according to the most current medical practices, but problems sometimes occur. Call your caregiver if you have any problems or questions after your procedure. HOME CARE INSTRUCTIONS   See a physical therapist as directed by your caregiver.  Take over-the-counter or prescription medicines for pain, discomfort, or fever only as directed by your caregiver.  Avoid lifting or driving until you are instructed otherwise.  If you have been sent home with a continuous passive motion machine, use it as directed by your caregiver. SEEK MEDICAL CARE IF:  You have difficulty breathing.  Your wound is red, swollen, or has become increasingly painful.  You have pus draining from your wound.  You have a bad smell coming from your wound.  You have persistent bleeding from your wound.  Your wound breaks open after sutures (stitches) or staples have been removed. SEEK IMMEDIATE MEDICAL CARE IF:   You have a fever.  You have a rash.  You have pain or swelling in your calf or thigh.  You have shortness of breath or chest pain.  Your range of motion in your knee is decreasing rather than increasing. MAKE SURE YOU:   Understand these instructions.  Will watch your condition.  Will get help right away if you are not doing well or get worse. Document Released: 01/07/2005 Document Revised: 12/20/2011 Document Reviewed: 08/09/2011 [facility] Patient Information 2014 [employer].

## 2013-11-15 NOTE — Brief Op Note (Signed)
11/15/2013  9:06 AM  PATIENT:  Katherine Tanner  54 y.o. female  PRE-OPERATIVE DIAGNOSIS:  DEGENERATIVE JOINT DISEASE BILATERAL KNEES  POST-OPERATIVE DIAGNOSIS:  degenerative joint disease bilateral knees   PROCEDURE:  Procedure(s): RIGHT TOTAL KNEE ARTHROPLASTY (Right) Marcaine/STEROID INJECTION (Left)  SURGEON:  Surgeon(s) and Role:    * Alta Corning, MD - Primary  PHYSICIAN ASSISTANT:   ASSISTANTS: bethune   ANESTHESIA:   spinal  EBL:  Total I/O In: 1000 [I.V.:1000] Out: 350 [Urine:350]  BLOOD ADMINISTERED:none  DRAINS: (1) Hemovact drain(s) in the r knee with  Suction Open   LOCAL MEDICATIONS USED:  OTHER experel  SPECIMEN:  No Specimen  DISPOSITION OF SPECIMEN:  N/A  COUNTS:  YES  TOURNIQUET:   Total Tourniquet Time Documented: Thigh (Right) - 54 minutes Total: Thigh (Right) - 54 minutes   DICTATION: .Other Dictation: Dictation Number [id number]  PLAN OF CARE: Admit to inpatient   PATIENT DISPOSITION:  PACU - hemodynamically stable.   Delay start of Pharmacological VTE agent (>24hrs) due to surgical blood loss or risk of bleeding: no

## 2013-11-15 NOTE — Anesthesia Preprocedure Evaluation (Addendum)
Anesthesia Evaluation  Patient identified by MRN, date of birth, ID band Patient awake    Reviewed: Allergy & Precautions, H&P , NPO status , Patient's Chart, lab work & pertinent test results  History of Anesthesia Complications (+) PONV and history of anesthetic complications  Airway Mallampati: I TM Distance: >3 FB Neck ROM: Full    Dental  (+) Teeth Intact, Dental Advisory Given   Pulmonary asthma ,    Pulmonary exam normal       Cardiovascular hypertension,     Neuro/Psych negative neurological ROS  negative psych ROS   GI/Hepatic Neg liver ROS, hiatal hernia, GERD-  Controlled,  Endo/Other  negative endocrine ROS  Renal/GU negative Renal ROS     Musculoskeletal   Abdominal   Peds  Hematology   Anesthesia Other Findings   Reproductive/Obstetrics                         Anesthesia Physical Anesthesia Plan  ASA: II  Anesthesia Plan: MAC and Spinal   Post-op Pain Management:    Induction:   Airway Management Planned: Simple Face Mask  Additional Equipment:   Intra-op Plan:   Post-operative Plan:   Informed Consent: I have reviewed the patients History and Physical, chart, labs and discussed the procedure including the risks, benefits and alternatives for the proposed anesthesia with the patient or authorized representative who has indicated his/her understanding and acceptance.   Dental advisory given  Plan Discussed with: CRNA, Anesthesiologist and Surgeon  Anesthesia Plan Comments:        Anesthesia Quick Evaluation

## 2013-11-15 NOTE — Evaluation (Signed)
Physical Therapy Evaluation Patient Details Name: Muriel Hannold MRN: [id number] DOB: 01-Oct-1960 Today's Date: 11/15/2013   History of Present Illness  Patient is a 53 y/o female admitted s/p right TKA.  Clinical Impression  Patient presents with decreased independence with mobility due to deficits listed below.  She will benefit from skilled PT in the acute setting to allow return home with family assist and HHPT.    Follow Up Recommendations Home health PT    Equipment Recommendations  None recommended by PT    Recommendations for Other Services       Precautions / Restrictions Precautions Precautions: Fall;Knee Required Braces or Orthoses: Knee Immobilizer - Right Knee Immobilizer - Right: On when out of bed or walking Restrictions Weight Bearing Restrictions: Yes RLE Weight Bearing: Weight bearing as tolerated      Mobility  Bed Mobility Overal bed mobility: Needs Assistance Bed Mobility: Supine to Sit     Supine to sit: Min assist     General bed mobility comments: assist for right LE   Transfers Overall transfer level: Needs assistance Equipment used: Rolling walker (2 wheeled) Transfers: Sit to/from Stand Sit to Stand: Min assist         General transfer comment: assist for lifting and cues for technique  Ambulation/Gait Ambulation/Gait assistance: Min assist Ambulation Distance (Feet): 30 Feet Assistive device: Rolling walker (2 wheeled) Gait Pattern/deviations: Step-to pattern;Antalgic     General Gait Details: cues for sequence and technique  Stairs            Wheelchair Mobility    Modified Rankin (Stroke Patients Only)       Balance Overall balance assessment: Needs assistance         Standing balance support: Bilateral upper extremity supported Standing balance-Leahy Scale: Fair Standing balance comment: stands unsupported briefly; walker for support with ambulation due to pain                              Pertinent Vitals/Pain Mod c/o RN delivered meds prior to [place] expects to be discharged to:: Private residence Living Arrangements: Spouse/significant other;Children Available Help at Discharge: Family Type of Home: House Home Access: Stairs to enter Entrance Stairs-Rails: Psychiatric nurse of Steps: Kimberly: One level Home Equipment: Environmental consultant - 2 wheels;Bedside commode Additional Comments: borrowed from pastor's wife    Prior Function Level of Independence: Independent               Hand Dominance        Extremity/Trunk Assessment                 RLE Deficits / Details: ankle AROM WFL, knee AAROM flexion to about 50 degrees, strength at least 2/5       Communication   Communication: No difficulties  Cognition Arousal/Alertness: Awake/alert Behavior During Therapy: WFL for tasks assessed/performed Overall Cognitive Status: Within Functional Limits for tasks assessed                      General Comments      Exercises Total Joint Exercises Ankle Circles/Pumps: AROM;Both;10 reps;Supine Quad Sets: AROM;Right;5 reps;Supine Heel Slides: AAROM;Right;5 reps;Supine      Assessment/Plan    PT Assessment Patient needs continued PT services  PT Diagnosis Acute pain;Difficulty walking   PT Problem List Decreased strength;Decreased range of motion;Decreased balance;Decreased activity tolerance;Decreased mobility;Decreased knowledge of precautions;Decreased knowledge of use  of DME  PT Treatment Interventions DME instruction;Gait training;Balance training;Stair training;Functional mobility training;Patient/family education;Therapeutic activities;Therapeutic exercise   PT Goals (Current goals can be found in the Care Plan section) Acute Rehab PT Goals Patient Stated Goal: To return home with family assist PT Goal Formulation: With patient Time For Goal Achievement: 11/22/13 Potential to Achieve  Goals: Good    Frequency Min 6X/week   Barriers to discharge        Co-evaluation               End of Session Equipment Utilized During Treatment: Gait belt;Right knee immobilizer Activity Tolerance: Patient tolerated treatment well Patient left: in chair;with call bell/phone within reach           Time: 1455-1525 PT Time Calculation (min): 30 min   Charges:   PT Evaluation $Initial PT Evaluation Tier I: 1 Procedure PT Treatments $Gait Training: 8-22 mins   PT G CodesMax Sane 11/15/2013, 3:34 PM Magda Kiel, [place] 11/15/2013

## 2013-11-15 NOTE — Progress Notes (Signed)
Utilization review completed.  

## 2013-11-15 NOTE — Care Management Note (Signed)
CARE MANAGEMENT NOTE 11/15/2013  Patient:  Katherine Tanner, Katherine Tanner   Account Number:  [id number]  Date Initiated:  11/15/2013  Documentation initiated by:  Ricki Miller  Subjective/Objective Assessment:   53 yr old female s/p right total knee arthroplasty.     Action/Plan:   Case manager spoke with patient concerning home health and DME needs at discharge. Patient states she is setup with [facility]. Case Manager will fax orders to [place] office. patient has rolling walker and 3in1.   Anticipated DC Date:  11/17/2013   Anticipated DC Plan:  [place] Planning Services  CM consult      [facility]  HOME HEALTH  DURABLE MEDICAL EQUIPMENT   Choice offered to / List presented to:  C-1 Patient   DME arranged  CPM      DME agency  [employer]     [place] arranged  HH-2 PT      [facility] agency  [place]   Status of service:  Completed, signed off Medicare Important Message given?   (If response is "NO", the following Medicare IM given date fields will be blank) Date Medicare IM given:   Date Additional Medicare IM given:    Discharge Disposition:  [place]

## 2013-11-15 NOTE — Anesthesia Postprocedure Evaluation (Signed)
  Anesthesia Post-op Note  Patient: Katherine Tanner  Procedure(s) Performed: Procedure(s): RIGHT TOTAL KNEE ARTHROPLASTY (Right) Marcaine/STEROID INJECTION (Left)  Patient Location: PACU  Anesthesia Type:General  Level of Consciousness: awake and alert   Airway and Oxygen Therapy: Patient Spontanous Breathing  Post-op Pain: none  Post-op Assessment: Post-op Vital signs reviewed, Patient's Cardiovascular Status Stable and Respiratory Function Stable  Post-op Vital Signs: Reviewed and stable  Last Vitals:  Filed Vitals:   11/15/13 1130  BP: 139/74  Pulse: 76  Temp:   Resp: 14    Complications: No apparent anesthesia complications

## 2013-11-15 NOTE — Anesthesia Procedure Notes (Addendum)
Anesthesia Regional Block:  Femoral nerve block  Pre-Anesthetic Checklist: ,, timeout performed, Correct Patient, Correct Site, Correct Laterality, Correct Procedure, Correct Position, site marked, Risks and benefits discussed,  Surgical consent,  Pre-op evaluation,  At surgeon's request and post-op pain management  Laterality: Right  Prep: chloraprep       Needles:  Injection technique: Single-shot  Needle Type: Echogenic Stimulator Needle     Needle Length: 5cm 5 cm Needle Gauge: 22 and 22 G    Additional Needles:  Procedures: ultrasound guided (picture in chart) and nerve stimulator Femoral nerve block  Nerve Stimulator or Paresthesia:  Response: quadraceps contraction, 0.45 mA,   Additional Responses:   Narrative:  Start time: 11/15/2013 7:04 AM End time: 11/15/2013 7:14 AM Injection made incrementally with aspirations every 5 mL.  Performed by: Personally  Anesthesiologist: Earnest Bailey, MD  Additional Notes: Functioning IV was confirmed and monitors were applied.  A 25mm 22ga Arrow echogenic stimulator needle was used. Sterile prep and drape,hand hygiene and sterile gloves were used. Ultrasound guidance: relevant anatomy identified, needle position confirmed, local anesthetic spread visualized around nerve(s)., vascular puncture avoided.  Image printed for medical record. Negative aspiration and negative test dose prior to incremental administration of local anesthetic. The patient tolerated the procedure well.     Spinal  Patient location during procedure: OR Start time: 11/15/2013 7:21 AM End time: 11/15/2013 7:28 AM Staffing Performed by: anesthesiologist  Preanesthetic Checklist Completed: patient identified, site marked, surgical consent, pre-op evaluation, timeout performed, IV checked, risks and benefits discussed and monitors and equipment checked Spinal Block Patient position: sitting Prep: Betadine Patient monitoring: heart rate, cardiac monitor,  continuous pulse ox and blood pressure Approach: midline Location: L3-4 Injection technique: single-shot Needle Needle type: Pencan  Needle gauge: 24 G Needle length: 10 cm Needle insertion depth: 8 cm

## 2013-11-16 LAB — CBC
HEMATOCRIT: 34.5 % — AB (ref 36.0–46.0)
HEMOGLOBIN: 11.3 g/dL — AB (ref 12.0–15.0)
MCH: 28.1 pg (ref 26.0–34.0)
MCHC: 32.8 g/dL (ref 30.0–36.0)
MCV: 85.8 fL (ref 78.0–100.0)
Platelets: 365 10*3/uL (ref 150–400)
RBC: 4.02 MIL/uL (ref 3.87–5.11)
RDW: 14.1 % (ref 11.5–15.5)
WBC: 16.7 10*3/uL — ABNORMAL HIGH (ref 4.0–10.5)

## 2013-11-16 LAB — BASIC METABOLIC PANEL
BUN: 9 mg/dL (ref 6–23)
CALCIUM: 9 mg/dL (ref 8.4–10.5)
CO2: 23 meq/L (ref 19–32)
Chloride: 104 mEq/L (ref 96–112)
Creatinine, Ser: 0.54 mg/dL (ref 0.50–1.10)
GFR calc Af Amer: >90 mL/min (ref >90)
Glucose, Bld: 221 mg/dL — ABNORMAL HIGH (ref 70–99)
Potassium: 4.2 mEq/L (ref 3.7–5.3)
Sodium: 139 mEq/L (ref 137–147)

## 2013-11-16 NOTE — Plan of Care (Signed)
Problem: Consults Goal: Diagnosis- Total Joint Replacement Outcome: Completed/Met Date Met:  11/16/13 Primary Total Knee Right

## 2013-11-16 NOTE — Progress Notes (Signed)
Physical Therapy Treatment Patient Details Name: Katherine Tanner MRN: [id number] DOB: 1960-07-13 Today's Date: 11/16/2013    History of Present Illness Patient is a 53 y/o female admitted s/p right TKA.    PT Comments    Patient very motivated and progressing well with therapy. She is hopeful that she will be ready to DC home tomorrow. Will continue to progress with ambulation later this afternoon  Follow Up Recommendations  Home health PT     Equipment Recommendations  None recommended by PT    Recommendations for Other Services       Precautions / Restrictions Precautions Precautions: Fall;Knee Required Braces or Orthoses: Knee Immobilizer - Right Knee Immobilizer - Right: On when out of bed or walking Restrictions RLE Weight Bearing: Weight bearing as tolerated    Mobility  Bed Mobility Overal bed mobility: Needs Assistance Bed Mobility: Supine to Sit     Supine to sit: Min assist     General bed mobility comments: assist for right LE   Transfers Overall transfer level: Needs assistance Equipment used: Rolling walker (2 wheeled) Transfers: Sit to/from Stand Sit to Stand: Min guard         General transfer comment: Cues for hand placement and to back all the way back to toilet and recliner prior to sitting  Ambulation/Gait Ambulation/Gait assistance: Min guard Ambulation Distance (Feet): 100 Feet Assistive device: Rolling walker (2 wheeled) Gait Pattern/deviations: Step-to pattern;Antalgic     General Gait Details: Cues for upright posture and to look forward   Stairs            Wheelchair Mobility    Modified Rankin (Stroke Patients Only)       Balance                                    Cognition Arousal/Alertness: Awake/alert Behavior During Therapy: WFL for tasks assessed/performed Overall Cognitive Status: Within Functional Limits for tasks assessed                      Exercises Total Joint  Exercises Quad Sets: AROM;Right;10 reps Heel Slides: AAROM;Right;10 reps Hip ABduction/ADduction: AAROM;Right;10 reps Straight Leg Raises: AAROM;Right;10 reps    General Comments        Pertinent Vitals/Pain Patient stated pain 5/10 in R knee. RN made aware    Home Living                      Prior Function            PT Goals (current goals can now be found in the care plan section) Progress towards PT goals: Progressing toward goals    Frequency  Min 6X/week    PT Plan Current plan remains appropriate    Co-evaluation             End of Session Equipment Utilized During Treatment: Gait belt;Right knee immobilizer Activity Tolerance: Patient tolerated treatment well Patient left: in chair;with call bell/phone within reach     Time: 0824-0851 PT Time Calculation (min): 27 min  Charges:  $Gait Training: 8-22 mins $Therapeutic Exercise: 8-22 mins                    G Codes:      Tonia Brooms Robinette 11/16/2013, 9:02 AM 11/16/2013 [place] PTA [phone] pager [phone] office

## 2013-11-16 NOTE — Progress Notes (Addendum)
PATIENT ID: Katherine Tanner  MRN: [id number]  DOB/AGE:  December 14, 1960 / 53 y.o.  1 Day Post-Op Procedure(s) (LRB): RIGHT TOTAL KNEE ARTHROPLASTY (Right) Marcaine/STEROID INJECTION (Left)    PROGRESS NOTE Subjective: Patient is alert, oriented, no Nausea, no Vomiting, yes passing gas, no Bowel Movement. Taking PO well. Denies SOB, Chest or Calf Pain. Using Incentive Spirometer, PAS in place. Ambulate WBAT Patient reports pain as 6 on 0-10 scale  .    Objective: Vital signs in last 24 hours: Filed Vitals:   11/15/13 2009 11/15/13 2101 11/16/13 0150 11/16/13 0435  BP:  146/73 122/65 123/67  Pulse: 97 95 91 85  Temp:  98.1 F (36.7 C) 98.1 F (36.7 C) 98.1 F (36.7 C)  TempSrc:  Oral Oral Oral  Resp: 18 18 18 18   Height:      Weight:      SpO2: 97% 98% 96% 100%      Intake/Output from previous day: I/O last 3 completed shifts: In: 3220 [P.O.:480; I.V.:3175] Out: 3765 [Urine:3475; Drains:290]   Intake/Output this shift:     LABORATORY DATA:  Recent Labs  11/16/13 0610  WBC 16.7*  HGB 11.3*  HCT 34.5*  PLT 365  NA 139  K 4.2  CL 104  CO2 23  BUN 9  CREATININE 0.54  GLUCOSE 221*  CALCIUM 9.0    Examination: Neurologically intact Neurovascular intact Sensation intact distally Intact pulses distally Dorsiflexion/Plantar flexion intact Incision: dressing C/D/I No cellulitis present Compartment soft}  Blood and plasma separated in drain indicating minimal recent drainage, drain pulled without difficulty.  Assessment:   1 Day Post-Op Procedure(s) (LRB): RIGHT TOTAL KNEE ARTHROPLASTY (Right) Marcaine/STEROID INJECTION (Left) ADDITIONAL DIAGNOSIS:  Hypertension and asthma  Plan: PT/OT WBAT, CPM 5/hrs day until ROM 0-90 degrees, then D/C CPM DVT Prophylaxis:  SCDx72hrs, ASA 325 mg BID x 2 weeks DISCHARGE PLAN: home DISCHARGE NEEDS: HHPT, [place], Walker and 3-in-1 comode seat     [name] 11/16/2013, 8:06 AM

## 2013-11-16 NOTE — Op Note (Signed)
NAMESEBASTIANA, Tanner     ACCOUNT NO.:  [id number]  MEDICAL RECORD NO.:  [id number]  LOCATION:  [id number]                        FACILITY:  [facility]  PHYSICIAN:  Alta Corning, M.D.   DATE OF BIRTH:  May 03, 1961  DATE OF PROCEDURE:  11/15/2013 DATE OF DISCHARGE:                              OPERATIVE REPORT   PREOPERATIVE DIAGNOSIS:  End-stage degenerative joint disease, right knee, painful left knee.  POSTOPERATIVE DIAGNOSIS:  End-stage degenerative joint disease, right knee, painful left knee.  PROCEDURE:  Right total knee replacement with Sigma system, size 2.5 femur, size 2.5 tibia, 15 mm bridging bearing, and a 32 mm all- polyethylene patella. Injection of left knee with 8 mL Marcaine and 3 mL of 80 mg/mL Depo-Medrol.  SURGEON:  Alta Corning, M.D.  ASSISTANT:  Gary Fleet, PA-C  ANESTHESIA:  General.  BRIEF HISTORY:  Katherine Tanner is a 53 year old female with a long history of significant complaints of right knee pain.  She had been treated conservatively for a long period of time with injection therapy, activity modification, and physical therapy, having failed all this and because of bone-on-bone changes on x-ray and night pain.  The patient was taken to the operating room for right total knee replacement.  PROCEDURE IN DETAIL:  The patient was taken to the operating room after adequate anesthesia was obtained with spinal anesthetic, the patient was placed supine on the operating table.  The right leg was then prepped and draped in usual sterile fashion.  Following this, the leg was exsanguinated.  Blood pressure tourniquet inflated to 350 mmHg. Following this, a midline incision was made and a subcutaneous tissue down to the level of extensor mechanism and medial parapatellar arthrotomy was undertaken.  Once this was done, the medial and lateral meniscus removed.  Retropatellar fat pad was removed.  Synovium in the anterior aspect of the femur and the  anterior and posterior cruciates, once this was done, the tibia was exposed and cut perpendicular to its long axis with an extra medullary guide.  Once this was completed, attention was turned to the femur, intramedullary hole was drilled and then a 10 mm of distal femur were resected.  Following this, the knee was sized to a 2.5 and the femoral trial was 2.5.  Anterior-posterior cuts were made, chamfers and box.  Attention was turned to the tibia, which was sized to a 2.5 was drilled and keeled.  Attention was then turned towards the placement of the trial components.  The lugs were drilled in the femur.  The patella was cut down to a level of 13 mm and then the lugs were drilled for the patella.  The trial patella was placed.  Excellent range of motion stability with a 15 mm spacer was achieved at this point.  Following this, the wound was copiously and thoroughly irrigated, suctioned dry, and the final components were cemented in place size 2.5 femur, size 2.5 tibia, 15 mm bridging trial was placed and a 32 mm all-polyethylene patella was held with a clamp. All excess bone cement was removed.  The cement was allowed to completely harden.  Once it was, a tourniquet was let down.  The trial of 75 which was just too tight in extension and  at that point, went back to the 15 and the final was placed.  A medium Hemovac drain was placed 60 mL of Exparel, which were 20 mL of Exparel diluted with 40 mL of normal saline was instilled all throughout the knee for postoperative anesthetic control.  The knee was again irrigated.  All bleeding were controlled with electrocautery after the tourniquet was let down, and the final poly was placed, the medial parapatellar arthrotomy was closed 1 Vicryl running, skin with 0 and 2-0 Vicryl and 3-0 Monocryl subcuticular.  Benzoin and Steri-Strips were applied.  Sterile compressive dressing was applied.  The patient was taken to the recovery room where she  was noted to be in satisfactory condition.  Estimated blood loss for procedure was none.  Once the right knee had been completed, the left knee was injected with 8 mL Marcaine and 3 mL of 80 mg/mL Depo-Medrol.  The patient was taken to the recovery room in satisfactory condition.     Alta Corning, M.D.     Corliss Skains  D:  11/15/2013  T:  11/16/2013  Job:  [id number]

## 2013-11-16 NOTE — Progress Notes (Signed)
Physical Therapy Treatment Patient Details Name: Katherine Tanner MRN: [id number] DOB: 1961-05-31 Today's Date: 11/16/2013    History of Present Illness Patient is a 53 y/o female admitted s/p right TKA.     PT Comments    Patient able to increase ambulation and therex both this afternoon. Patient will need to practice steps prior to DC home.   Follow Up Recommendations  Home health PT     Equipment Recommendations  None recommended by PT    Recommendations for Other Services       Precautions / Restrictions Precautions Precautions: Fall;Knee Required Braces or Orthoses: Knee Immobilizer - Right Knee Immobilizer - Right: On when out of bed or walking Restrictions RLE Weight Bearing: Weight bearing as tolerated    Mobility  Bed Mobility   Bed Mobility: Sit to Supine       Sit to supine: Supervision   General bed mobility comments: Cues for positioning  Transfers       Sit to Stand: Min guard         General transfer comment: Cues for hand placement  Ambulation/Gait Ambulation/Gait assistance: Min guard Ambulation Distance (Feet): 200 Feet Assistive device: Rolling walker (2 wheeled) Gait Pattern/deviations: Step-to pattern;Decreased step length - right;Decreased step length - left     General Gait Details: Patient stated that it felt much better to walk on this afternoon. Cues for upright posture   Stairs            Wheelchair Mobility    Modified Rankin (Stroke Patients Only)       Balance                                    Cognition Arousal/Alertness: Awake/alert Behavior During Therapy: WFL for tasks assessed/performed Overall Cognitive Status: Within Functional Limits for tasks assessed                      Exercises Total Joint Exercises Quad Sets: AROM;Right;15 reps Short Arc QuadSinclair Ship;Right;15 reps Heel Slides: AAROM;Right;15 reps Hip ABduction/ADduction: AAROM;Right;15 reps Straight Leg  Raises: AAROM;Right;15 reps    General Comments        Pertinent Vitals/Pain no apparent distress     Home Living                      Prior Function            PT Goals (current goals can now be found in the care plan section) Progress towards PT goals: Progressing toward goals    Frequency  Min 6X/week    PT Plan Current plan remains appropriate    Co-evaluation             End of Session Equipment Utilized During Treatment: Gait belt;Right knee immobilizer Activity Tolerance: Patient tolerated treatment well Patient left: with call bell/phone within reach;in bed;in CPM     Time: 4098-1191 PT Time Calculation (min): 26 min  Charges:  $Gait Training: 8-22 mins $Therapeutic Exercise: 8-22 mins                    G Codes:      Tonia Brooms Robinette 11/16/2013, 1:20 PM 11/16/2013 [place] PTA [phone] pager [phone] office

## 2013-11-17 LAB — CBC
HCT: 34.3 % — ABNORMAL LOW (ref 36.0–46.0)
Hemoglobin: 11.3 g/dL — ABNORMAL LOW (ref 12.0–15.0)
MCH: 28.2 pg (ref 26.0–34.0)
MCHC: 32.9 g/dL (ref 30.0–36.0)
MCV: 85.5 fL (ref 78.0–100.0)
PLATELETS: 393 10*3/uL (ref 150–400)
RBC: 4.01 MIL/uL (ref 3.87–5.11)
RDW: 14.5 % (ref 11.5–15.5)
WBC: 19.5 10*3/uL — ABNORMAL HIGH (ref 4.0–10.5)

## 2013-11-17 NOTE — Progress Notes (Signed)
PATIENT ID: Katherine Tanner  MRN: [id number]  DOB/AGE:  October 23, 1960 / 53 y.o.  2 Days Post-Op Procedure(s) (LRB): RIGHT TOTAL KNEE ARTHROPLASTY (Right) Marcaine/STEROID INJECTION (Left)    PROGRESS NOTE Subjective: Patient is alert, oriented, no Nausea, no Vomiting, yes passing gas, no Bowel Movement. Taking PO well. Denies SOB, Chest or Calf Pain. Using Incentive Spirometer, PAS in place. Ambulate WBAT, CPM 0-65 Patient reports pain as mild  .    Objective: Vital signs in last 24 hours: Filed Vitals:   11/16/13 1435 11/16/13 2154 11/16/13 2259 11/17/13 0515  BP: 147/80  171/80 117/59  Pulse: 102 92 87 83  Temp: 98.7 F (37.1 C)  98.2 F (36.8 C) 97.9 F (36.6 C)  TempSrc:   Oral Oral  Resp: 18 18 18 16   Height:      Weight:      SpO2: 100% 98% 100% 95%      Intake/Output from previous day: I/O last 3 completed shifts: In: 2975 [P.O.:1200; I.V.:1775] Out: 1350 [Urine:1200; Drains:150]   Intake/Output this shift:     LABORATORY DATA:  Recent Labs  11/16/13 0610 11/17/13 0635  WBC 16.7* 19.5*  HGB 11.3* 11.3*  HCT 34.5* 34.3*  PLT 365 393  NA 139  --   K 4.2  --   CL 104  --   CO2 23  --   BUN 9  --   CREATININE 0.54  --   GLUCOSE 221*  --   CALCIUM 9.0  --     Examination: Neurologically intact Neurovascular intact Sensation intact distally Intact pulses distally Dorsiflexion/Plantar flexion intact Incision: dressing C/D/I No cellulitis present Compartment soft}  Assessment:   2 Days Post-Op Procedure(s) (LRB): RIGHT TOTAL KNEE ARTHROPLASTY (Right) Marcaine/STEROID INJECTION (Left) ADDITIONAL DIAGNOSIS:  Hypertension and asthma  Plan: PT/OT WBAT, CPM 5/hrs day until ROM 0-90 degrees, then D/C CPM DVT Prophylaxis:  SCDx72hrs, ASA 325 mg BID x 2 weeks DISCHARGE PLAN: Home, today if pt meets PT goals. DISCHARGE NEEDS: HHPT, [place], CPM, Walker and 3-in-1 comode seat     [name] 11/17/2013, 7:52 AM

## 2013-11-17 NOTE — Discharge Summary (Signed)
Patient ID: Katherine Tanner MRN: [id number] DOB/AGE: 51-Mar-1962 51 y.o.  Admit date: 11/15/2013 Discharge date: 11/17/2013  Admission Diagnoses:  Principal Problem:   Osteoarthritis of right knee Active Problems:   Osteoarthritis of left knee   Discharge Diagnoses:  Same  Past Medical History  Diagnosis Date  . Allergy     takes Claritin daily and Flonase daily  . Asthma     uses Albuterol daily as needed;Singulair nightly;DUlera daily  . Hypertension     takes Hyzaar daily  . PONV (postoperative nausea and vomiting)   . Pneumonia     hx of;last time at least 64yrs ago  . History of bronchitis     last time 80yrs ago  . Arthritis   . Joint pain   . Joint swelling   . H/O hiatal hernia   . GERD (gastroesophageal reflux disease)     occasionally will take OTC meds but states she just watches what she eats  . History of colon polyps   . History of shingles     Surgeries: Procedure(s): RIGHT TOTAL KNEE ARTHROPLASTY Marcaine/STEROID INJECTION on 11/15/2013   Consultants:    Discharged Condition: Improved  Hospital Course: Karl Knarr is an 53 y.o. female who was admitted 11/15/2013 for operative treatment ofOsteoarthritis of right knee. Patient has severe unremitting pain that affects sleep, daily activities, and work/hobbies. After pre-op clearance the patient was taken to the operating room on 11/15/2013 and underwent  Procedure(s): RIGHT TOTAL KNEE ARTHROPLASTY Marcaine/STEROID INJECTION.    Patient was given perioperative antibiotics: Anti-infectives   Start     Dose/Rate Route Frequency Ordered Stop   11/15/13 1330  clindamycin (CLEOCIN) IVPB 600 mg     600 mg 100 mL/hr over 30 Minutes Intravenous Every 6 hours 11/15/13 1206 11/15/13 1900   11/15/13 0600  clindamycin (CLEOCIN) IVPB 900 mg     900 mg 100 mL/hr over 30 Minutes Intravenous On call to O.R. 11/14/13 1422 11/15/13 0735       Patient was given sequential compression devices, early  ambulation, and chemoprophylaxis to prevent DVT.  Patient benefited maximally from hospital stay and there were no complications.    Recent vital signs: Patient Vitals for the past 24 hrs:  BP Temp Temp src Pulse Resp SpO2  11/17/13 0515 117/59 mmHg 97.9 F (36.6 C) Oral 83 16 95 %  11/16/13 2259 171/80 mmHg 98.2 F (36.8 C) Oral 87 18 100 %  11/16/13 2154 - - - 92 18 98 %  11/16/13 1435 147/80 mmHg 98.7 F (37.1 C) - 102 18 100 %  11/16/13 0849 - - - - - 97 %     Recent laboratory studies:  Recent Labs  11/16/13 0610 11/17/13 0635  WBC 16.7* 19.5*  HGB 11.3* 11.3*  HCT 34.5* 34.3*  PLT 365 393  NA 139  --   K 4.2  --   CL 104  --   CO2 23  --   BUN 9  --   CREATININE 0.54  --   GLUCOSE 221*  --   CALCIUM 9.0  --      Discharge Medications:     Medication List         albuterol 108 (90 BASE) MCG/ACT inhaler  Commonly known as:  PROVENTIL HFA;VENTOLIN HFA  Inhale 2 puffs into the lungs every 6 (six) hours as needed for wheezing or shortness of breath.     aspirin EC 325 MG tablet  Take 1 tablet (325 mg total) by  mouth 2 (two) times daily after a meal. Take x 1 month post op to decrease risk of blood clots.     fluticasone 50 MCG/ACT nasal spray  Commonly known as:  FLONASE  Place 2 sprays into both nostrils daily.     guaiFENesin 600 MG 12 hr tablet  Commonly known as:  MUCINEX  Take by mouth 2 (two) times daily.     loratadine 10 MG tablet  Commonly known as:  CLARITIN  Take 10 mg by mouth daily as needed for allergies.     losartan-hydrochlorothiazide 50-12.5 MG per tablet  Commonly known as:  HYZAAR  Take 1 tablet by mouth daily.     methocarbamol 750 MG tablet  Commonly known as:  ROBAXIN-750  Take 1 tablet (750 mg total) by mouth every 8 (eight) hours as needed for muscle spasms.     mometasone-formoterol 100-5 MCG/ACT Aero  Commonly known as:  DULERA  Inhale 2 puffs into the lungs 2 (two) times daily.     montelukast 10 MG tablet  Commonly  known as:  SINGULAIR  Take 1 tablet (10 mg total) by mouth at bedtime.     MULTIVITAMIN PO  Take 1 tablet by mouth daily.     oxyCODONE-acetaminophen 5-325 MG per tablet  Commonly known as:  PERCOCET/ROXICET  Take 1 tablet by mouth every 8 (eight) hours as needed for severe pain.     oxyCODONE-acetaminophen 5-325 MG per tablet  Commonly known as:  PERCOCET/ROXICET  Take 1-2 tablets by mouth every 6 (six) hours as needed for severe pain.     predniSONE 20 MG tablet  Commonly known as:  DELTASONE  Take 1 tablet (20 mg total) by mouth daily with breakfast.     pseudoephedrine 30 MG tablet  Commonly known as:  SUDAFED  Take 30 mg by mouth every 4 (four) hours as needed for congestion.     traMADol 50 MG tablet  Commonly known as:  ULTRAM  Take 50-100 mg by mouth every 6 (six) hours as needed for moderate pain.        Diagnostic Studies: Dg Chest 2 View  11/07/2013   CLINICAL DATA:  Preop for right knee replacement. Hypertension. Asthma.  EXAM: CHEST  2 VIEW  COMPARISON:  05/2010  FINDINGS: Lateral view degraded by patient arm position. Mild S-shaped thoracolumbar spine curvature. Midline trachea. Normal heart size and mediastinal contours. No pleural effusion or pneumothorax. Mild low lung volumes. Clear lungs.  IMPRESSION: No acute cardiopulmonary disease.   Electronically Signed   By: Abigail Miyamoto M.D.   On: 11/07/2013 15:41    Disposition: 01-Home or Self Care      Discharge Instructions   CPM    Complete by:  As directed   Continuous passive motion machine (CPM):      Use the CPM from 0 to 60  for 5 hours per day.      You may increase by 10 degrees per day.  You may break it up into 2 or 3 sessions per day.      Use CPM for 2 weeks or until you are told to stop.     Call MD / Call 911    Complete by:  As directed   If you experience chest pain or shortness of breath, CALL 911 and be transported to the hospital emergency room.  If you develope a fever above 101 F, pus  (white drainage) or increased drainage or redness at the wound, or calf  pain, call your surgeon's office.     Change dressing    Complete by:  As directed   Change dressing on 5, then change the dressing daily with sterile 4 x 4 inch gauze dressing and apply TED hose.  You may clean the incision with alcohol prior to redressing.     Constipation Prevention    Complete by:  As directed   Drink plenty of fluids.  Prune juice may be helpful.  You may use a stool softener, such as Colace (over the counter) 100 mg twice a day.  Use MiraLax (over the counter) for constipation as needed.     Diet - low sodium heart healthy    Complete by:  As directed      Discharge instructions    Complete by:  As directed   Follow up in office with Dr. Berenice Primas in 2 weeks.     Driving restrictions    Complete by:  As directed   No driving for 2 weeks     Increase activity slowly as tolerated    Complete by:  As directed      Patient may shower    Complete by:  As directed   You may shower without a dressing once there is no drainage.  Do not wash over the wound.  If drainage remains, cover wound with plastic wrap and then shower.           Follow-up Information   Follow up with GRAVES,JOHN L, MD. Schedule an appointment as soon as possible for a visit in 2 weeks.   Specialty:  Orthopedic Surgery   Contact information:   [place] [address] [phone]       Follow up with [name]. (Someone from [facility] will contact you concerning start date and time for physical therapy.)    Specialty:  Home Health Services   Contact information:   [address] [place] [address] [phone]       Follow up with GRAVES,JOHN L, MD In 2 weeks.   Specialty:  Orthopedic Surgery   Contact information:   [place] [address] [phone]        Signed: [name] 11/17/2013, 7:56 AM

## 2013-11-17 NOTE — Progress Notes (Signed)
Discharge instructions and prescriptions given to and reviewed with patient. Patient denies questions or concerns. IV removed without complications. Vital signs stable. Patient discharged home with home health.

## 2013-11-17 NOTE — Progress Notes (Signed)
Physical Therapy Treatment Patient Details Name: Katherine Tanner MRN: [id number] DOB: [birth year] Today's Date: 26-Nov-2013    History of Present Illness Patient is a 53 y/o female admitted s/p right TKA.    PT Comments    Pt making great progress with mobility today. Stair education completed.  Follow Up Recommendations  Home health PT     Equipment Recommendations  None recommended by PT    Precautions / Restrictions Precautions Precautions: Fall;Knee Required Braces or Orthoses: Knee Immobilizer - Right Knee Immobilizer - Right: On when out of bed or walking Restrictions RLE Weight Bearing: Weight bearing as tolerated    Mobility  Bed Mobility Overal bed mobility: Needs Assistance       Supine to sit: Modified independent (Device/Increase time)     General bed mobility comments: cues to ease transitional movements  Transfers Overall transfer level: Modified independent Equipment used: Rolling walker (2 wheeled) Transfers: Sit to/from Stand Sit to Stand: Modified independent (Device/Increase time)            Ambulation/Gait Ambulation/Gait assistance: Supervision;Modified independent (Device/Increase time) Ambulation Distance (Feet): 300 Feet Assistive device: Rolling walker (2 wheeled) Gait Pattern/deviations: Step-through pattern;Antalgic Gait velocity: decreased Gait velocity interpretation: Below normal speed for age/gender General Gait Details: minimal cues on posture and walker position needed with gait   Stairs Stairs: Yes Stairs assistance: Supervision Stair Management: Two rails;Step to pattern;Forwards Number of Stairs: 5 General stair comments: cues on sequence/technique with stairs.      Cognition Arousal/Alertness: Awake/alert Behavior During Therapy: WFL for tasks assessed/performed Overall Cognitive Status: Within Functional Limits for tasks assessed                      Exercises Total Joint Exercises Ankle  Circles/Pumps: AROM;Both;10 reps;Supine Quad Sets: AROM;Strengthening;Right;10 reps;Supine Short Arc Quad: AROM;Strengthening;Right;10 reps;Supine Heel Slides: AAROM;Strengthening;Right;10 reps;Supine Hip ABduction/ADduction: AROM;Strengthening;Right;10 reps;Supine Straight Leg Raises: AAROM;Strengthening;Right;10 reps;Supine             Home Living Family/patient expects to be discharged to:: Private residence Living Arrangements: Spouse/significant other                PT Goals (current goals can now be found in the care plan section) Acute Rehab PT Goals Patient Stated Goal: To return home with family assist PT Goal Formulation: With patient Time For Goal Achievement: 11/22/13 Potential to Achieve Goals: Good Progress towards PT goals: Progressing toward goals    Frequency  Min 6X/week    PT Plan Current plan remains appropriate       End of Session Equipment Utilized During Treatment: Gait belt;Right knee immobilizer Activity Tolerance: Patient tolerated treatment well Patient left: with call bell/phone within reach;in chair;with family/visitor present     Time: 1114-1140 PT Time Calculation (min): 26 min  Charges:  $Gait Training: 8-22 mins $Therapeutic Exercise: 8-22 mins                    G Codes:      Willow Ora 11/26/2013, 1:34 PM  Willow Ora, PTA Office- [phone]

## 2013-11-19 ENCOUNTER — Encounter (HOSPITAL_COMMUNITY): Payer: Self-pay | Admitting: Orthopedic Surgery

## 2013-11-27 NOTE — Assessment & Plan Note (Signed)
Large local reaction to insect sting but no systemic reaction. We discussed potential kinds of reactions and basic avoidance.

## 2013-11-27 NOTE — Assessment & Plan Note (Addendum)
Mild variable tightness without significant asthma attack. Controlled with meds. Plan-she will use her medications at the time of surgery and resume immediately after.

## 2013-12-09 ENCOUNTER — Other Ambulatory Visit: Payer: Self-pay | Admitting: Family Medicine

## 2013-12-09 NOTE — Telephone Encounter (Signed)
Please schedule a f/u in 3 mo and refill until then, thanks

## 2013-12-09 NOTE — Telephone Encounter (Signed)
Fax refill request, no recent/future appt. with you, please advise   (was refilled in April but pt is requesting 90 day supply)

## 2013-12-10 ENCOUNTER — Other Ambulatory Visit: Payer: Self-pay | Admitting: *Deleted

## 2013-12-10 MED ORDER — LOSARTAN POTASSIUM-HCTZ 50-12.5 MG PO TABS: 1.0000 | ORAL_TABLET | Freq: Every day | ORAL | Status: DC

## 2013-12-10 NOTE — Telephone Encounter (Signed)
appt scheduled and med refilled 

## 2013-12-27 NOTE — OR Nursing (Signed)
Addendum to scope page 

## 2014-02-24 ENCOUNTER — Ambulatory Visit (INDEPENDENT_AMBULATORY_CARE_PROVIDER_SITE_OTHER): Payer: 59 | Admitting: Family Medicine

## 2014-02-24 ENCOUNTER — Encounter: Payer: Self-pay | Admitting: Family Medicine

## 2014-02-24 VITALS — BP 138/76 | HR 81 | Temp 98.6°F | Ht 61.0 in | Wt 203.2 lb

## 2014-02-24 DIAGNOSIS — D72829 Elevated white blood cell count, unspecified: Secondary | ICD-10-CM

## 2014-02-24 DIAGNOSIS — R7309 Other abnormal glucose: Secondary | ICD-10-CM

## 2014-02-24 DIAGNOSIS — R7303 Prediabetes: Secondary | ICD-10-CM | POA: Insufficient documentation

## 2014-02-24 DIAGNOSIS — R739 Hyperglycemia, unspecified: Secondary | ICD-10-CM

## 2014-02-24 DIAGNOSIS — I10 Essential (primary) hypertension: Secondary | ICD-10-CM

## 2014-02-24 DIAGNOSIS — E785 Hyperlipidemia, unspecified: Secondary | ICD-10-CM

## 2014-02-24 DIAGNOSIS — D62 Acute posthemorrhagic anemia: Secondary | ICD-10-CM

## 2014-02-24 MED ORDER — LOSARTAN POTASSIUM-HCTZ 50-12.5 MG PO TABS: 1.0000 | ORAL_TABLET | Freq: Every day | ORAL | Status: DC

## 2014-02-24 NOTE — Progress Notes (Signed)
Subjective:    Patient ID: Katherine Tanner, female    DOB: Jul 14, 1960, 53 y.o.   MRN: [id number]  HPI Here for f/u of chronic medical problems   Wt is up 3 lb with bmi of 38  bp is stable today  No cp or palpitations or headaches or edema  No side effects to medicines  BP Readings from Last 3 Encounters:  02/24/14 138/76  11/17/13 117/59  11/17/13 117/59     Had R tot knee repl in may  It was tough on her  Was released from therapy last week  Is now back at work  A little stiffness and swelling  Does ride a bicycle during lunch  Off oxycontin  Then hydocodone  Now tramadol and tylenol (watching how much tylenol she takes)   Is doing pretty well overall   She did an A1C - had it done through work a while ago  Was close to pre diabetic  Watches what she eats now and also exercising  Stays away from sweets overall   Lab Results  Component Value Date   WBC 19.5* 11/17/2013   HGB 11.3* 11/17/2013   HCT 34.3* 11/17/2013   MCV 85.5 11/17/2013   PLT 393 11/17/2013   this was from the hospital -had uti (was treated) Also had a steroid shot  Want to re check this with anemia (post op)   Patient Active Problem List   Diagnosis Date Noted  . Osteoarthritis of right knee 11/15/2013  . Osteoarthritis of left knee 11/15/2013  . Hypokalemia 01/02/2012  . Alkaline phosphatase elevation 01/02/2012  . Hyperlipidemia 01/02/2012  . Leukocytosis 01/02/2012  . Routine general medical examination at a health care facility 12/09/2011  . 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 Neck pain 03/08/2011  . Low back pain 03/08/2011  . POLYARTHRITIS 01/28/2010  . ANGIOEDEMA 03/27/2007  . HYPERTENSION 03/19/2007  . Perennial allergic rhinitis with seasonal variation 03/19/2007  . Allergic-infective asthma 03/19/2007  . GERD 03/19/2007  . HIATAL HERNIA 03/19/2007  . GESTATIONAL DIABETES 03/19/2007   Past Medical History  Diagnosis Date  . Allergy     takes Claritin daily and  Flonase daily  . Asthma     uses Albuterol daily as needed;Singulair nightly;DUlera daily  . Hypertension     takes Hyzaar daily  . PONV (postoperative nausea and vomiting)   . Pneumonia     hx of;last time at least 75yrs ago  . History of bronchitis     last time 52yrs ago  . Arthritis   . Joint pain   . Joint swelling   . H/O hiatal hernia   . GERD (gastroesophageal reflux disease)     occasionally will take OTC meds but states she just watches what she eats  . History of colon polyps   . History of shingles    Past Surgical History  Procedure Laterality Date  . Tubal ligation    . Right ganglion cyst      x 2  . Cardiac catheterization    . Knee surgery Right     arthroscopy  . Right lumpectomy  around 1983  . Colonoscopy    . Esophagogastroduodenoscopy    . Total knee arthroplasty Right 11/15/2013    Procedure: RIGHT TOTAL KNEE ARTHROPLASTY;  Surgeon: Alta Corning, MD;  Location: [facility];  Service: Orthopedics;  Laterality: Right;  . Steriod injection Left 11/15/2013    Procedure: Marcaine/STEROID INJECTION;  Surgeon: Alta Corning, MD;  Location:  [facility];  Service: Orthopedics;  Laterality: Left;   History  Substance Use Topics  . Smoking status: Never Smoker   . Smokeless tobacco: Not on file  . Alcohol Use: No   Family History  Problem Relation Age of Onset  . Diabetes Father   . Sarcoidosis Mother   . Glaucoma Mother   . Hypertension Mother    Allergies  Allergen Reactions  . Ace Inhibitors     Cough/ breathing problems   . Amoxicillin-Pot Clavulanate     REACTION: swelling  . Influenza Virus Vacc Split Pf    Current Outpatient Prescriptions on File Prior to Visit  Medication Sig Dispense Refill  . albuterol (PROVENTIL HFA;VENTOLIN HFA) 108 (90 BASE) MCG/ACT inhaler Inhale 2 puffs into the lungs every 6 (six) hours as needed for wheezing or shortness of breath.      . fluticasone (FLONASE) 50 MCG/ACT nasal spray Place 2 sprays into both nostrils daily.  48  g  3  . guaiFENesin (MUCINEX) 600 MG 12 hr tablet Take by mouth 2 (two) times daily as needed.       . loratadine (CLARITIN) 10 MG tablet Take 10 mg by mouth daily as needed for allergies.       [name] losartan-hydrochlorothiazide (HYZAAR) 50-12.5 MG per tablet Take 1 tablet by mouth daily.  90 tablet  0  . methocarbamol (ROBAXIN-750) 750 MG tablet Take 1 tablet (750 mg total) by mouth every 8 (eight) hours as needed for muscle spasms.  40 tablet  0  . mometasone-formoterol (DULERA) 100-5 MCG/ACT AERO Inhale 2 puffs into the lungs 2 (two) times daily.  3 Inhaler  3  . montelukast (SINGULAIR) 10 MG tablet Take 1 tablet (10 mg total) by mouth at bedtime.  90 tablet  3  . Multiple Vitamin (MULTIVITAMIN PO) Take 1 tablet by mouth daily.       . pseudoephedrine (SUDAFED) 30 MG tablet Take 30 mg by mouth every 4 (four) hours as needed for congestion.       . traMADol (ULTRAM) 50 MG tablet Take 50-100 mg by mouth every 6 (six) hours as needed for moderate pain.        No current facility-administered medications on file prior to visit.    Review of Systems Review of Systems  Constitutional: Negative for fever, appetite change, fatigue and unexpected weight change.  Eyes: Negative for pain and visual disturbance.  Respiratory: Negative for cough and shortness of breath.   Cardiovascular: Negative for cp or palpitations    Gastrointestinal: Negative for nausea, diarrhea and constipation.  Genitourinary: Negative for urgency and frequency.  Skin: Negative for pallor or rash   MSK pos for R knee pain that is overall improved  Neurological: Negative for weakness, light-headedness, numbness and headaches.  Hematological: Negative for adenopathy. Does not bruise/bleed easily.  Psychiatric/Behavioral: Negative for dysphoric mood. The patient is not nervous/anxious.         Objective:   Physical Exam  Constitutional: She appears well-developed and well-nourished. No distress.  obese and well appearing     HENT:  Head: Normocephalic and atraumatic.  Mouth/Throat: Oropharynx is clear and moist.  Eyes: Conjunctivae and EOM are normal. Pupils are equal, round, and reactive to light. No scleral icterus.  Neck: Normal range of motion. Neck supple. No JVD present. No thyromegaly present.  Cardiovascular: Normal rate, regular rhythm, normal heart sounds and intact distal pulses.  Exam reveals no gallop.   Pulmonary/Chest: Effort normal and breath sounds normal. No  respiratory distress. She has no wheezes. She has no rales.  Abdominal: Soft. Bowel sounds are normal. She exhibits no distension and no mass. There is no tenderness.  Musculoskeletal: She exhibits no edema.  Scar on R knee    Lymphadenopathy:    She has no cervical adenopathy.  Neurological: She is alert. She has normal reflexes. No cranial nerve deficit. She exhibits normal muscle tone. Coordination normal.  Skin: Skin is warm and dry. No rash noted. No erythema. No pallor.  Psychiatric: She has a normal mood and affect.          Assessment & Plan:   Problem List Items Addressed This Visit     Cardiovascular and Mediastinum   HYPERTENSION - Primary      bp in fair control at this time  BP Readings from Last 1 Encounters:  02/24/14 138/76   No changes needed Disc lifstyle change with low sodium diet and exercise  Labs today hyzaar refilled     Relevant Medications      losartan-hydrochlorothiazide (HYZAAR) 50-12.5 MG per tablet   Other Relevant Orders      CBC with Differential      Comprehensive metabolic panel      TSH      Lipid panel     Other   Hyperlipidemia     Lipid panel today  Rev low sat fat diet      Relevant Medications      losartan-hydrochlorothiazide (HYZAAR) 50-12.5 MG per tablet   Other Relevant Orders      Lipid panel   Hyperglycemia     Per pt -borderline DM at work in labs  A1C today Rev low glycemic diet and need for wt loss Also had gest DM in the past     Relevant Orders       Hemoglobin A1c   Leukocytosis, unspecified     Inc wbc in hosp after knee repl- due to uti and also steroid shot Re check this today      Relevant Orders      CBC with Differential   Postoperative anemia due to acute blood loss     Re check since her knee surgery in May    Relevant Orders      CBC with Differential

## 2014-02-24 NOTE — Patient Instructions (Signed)
Keep working hard on healthy diet and exercise  Work on weight loss too  [place] today

## 2014-02-24 NOTE — Progress Notes (Signed)
Pre visit review using our clinic review tool, if applicable. No additional management support is needed unless otherwise documented below in the visit note. 

## 2014-02-24 NOTE — Assessment & Plan Note (Signed)
Lipid panel today  Rev low sat fat diet

## 2014-02-24 NOTE — Assessment & Plan Note (Signed)
Re check since her knee surgery in May

## 2014-02-24 NOTE — Assessment & Plan Note (Signed)
Inc wbc in [place] after knee repl- due to uti and also steroid shot Re check this today

## 2014-02-24 NOTE — Assessment & Plan Note (Signed)
Per pt -borderline DM at work in labs  A1C today Rev low glycemic diet and need for wt loss Also had gest DM in the past

## 2014-02-24 NOTE — Assessment & Plan Note (Signed)
bp in fair control at this time  BP Readings from Last 1 Encounters:  02/24/14 138/76   No changes needed Disc lifstyle change with low sodium diet and exercise  Labs today hyzaar refilled

## 2014-02-25 ENCOUNTER — Telehealth: Payer: Self-pay | Admitting: Family Medicine

## 2014-02-25 LAB — CBC WITH DIFFERENTIAL/PLATELET
BASOS ABS: 0.1 10*3/uL (ref 0.0–0.1)
Basophils Relative: 0.9 % (ref 0.0–3.0)
EOS PCT: 1.4 % (ref 0.0–5.0)
Eosinophils Absolute: 0.1 10*3/uL (ref 0.0–0.7)
HEMATOCRIT: 35 % — AB (ref 36.0–46.0)
Hemoglobin: 11.3 g/dL — ABNORMAL LOW (ref 12.0–15.0)
LYMPHS ABS: 2.2 10*3/uL (ref 0.7–4.0)
Lymphocytes Relative: 28.7 % (ref 12.0–46.0)
MCHC: 32.3 g/dL (ref 30.0–36.0)
MCV: 86.2 fl (ref 78.0–100.0)
MONO ABS: 0.7 10*3/uL (ref 0.1–1.0)
Monocytes Relative: 9.2 % (ref 3.0–12.0)
NEUTROS PCT: 59.8 % (ref 43.0–77.0)
Neutro Abs: 4.6 10*3/uL (ref 1.4–7.7)
PLATELETS: 311 10*3/uL (ref 150.0–400.0)
RBC: 4.06 Mil/uL (ref 3.87–5.11)
RDW: 14.5 % (ref 11.5–15.5)
WBC: 7.6 10*3/uL (ref 4.0–10.5)

## 2014-02-25 LAB — COMPREHENSIVE METABOLIC PANEL
ALBUMIN: 3.8 g/dL (ref 3.5–5.2)
ALK PHOS: 118 U/L — AB (ref 39–117)
ALT: 35 U/L (ref 0–35)
AST: 30 U/L (ref 0–37)
BILIRUBIN TOTAL: 0.3 mg/dL (ref 0.2–1.2)
BUN: 11 mg/dL (ref 6–23)
CO2: 31 mEq/L (ref 19–32)
Calcium: 9.6 mg/dL (ref 8.4–10.5)
Chloride: 101 mEq/L (ref 96–112)
Creatinine, Ser: 0.6 mg/dL (ref 0.4–1.2)
GFR: 125.57 mL/min (ref >60.00)
Glucose, Bld: 88 mg/dL (ref 70–99)
POTASSIUM: 3.7 meq/L (ref 3.5–5.1)
Sodium: 139 mEq/L (ref 135–145)
TOTAL PROTEIN: 7.5 g/dL (ref 6.0–8.3)

## 2014-02-25 LAB — LIPID PANEL
CHOL/HDL RATIO: 3
Cholesterol: 196 mg/dL (ref 0–200)
HDL: 56.5 mg/dL (ref >39.00)
LDL CALC: 120 mg/dL — AB (ref 0–99)
NONHDL: 139.5
TRIGLYCERIDES: 100 mg/dL (ref 0.0–149.0)
VLDL: 20 mg/dL (ref 0.0–40.0)

## 2014-02-25 LAB — HEMOGLOBIN A1C: Hgb A1c MFr Bld: 6.2 % (ref 4.6–6.5)

## 2014-02-25 LAB — TSH: TSH: 0.31 u[IU]/mL — ABNORMAL LOW (ref 0.35–4.50)

## 2014-02-25 NOTE — Telephone Encounter (Signed)
Relevant patient education assigned to patient using Emmi. ° °

## 2014-04-04 ENCOUNTER — Encounter: Payer: Self-pay | Admitting: Family Medicine

## 2014-04-08 ENCOUNTER — Other Ambulatory Visit (INDEPENDENT_AMBULATORY_CARE_PROVIDER_SITE_OTHER): Payer: 59

## 2014-04-08 DIAGNOSIS — E0590 Thyrotoxicosis, unspecified without thyrotoxic crisis or storm: Secondary | ICD-10-CM

## 2014-04-08 NOTE — Addendum Note (Signed)
Addended by: Ellamae Sia on: 04/08/2014 03:16 PM   Modules accepted: Orders

## 2014-04-09 LAB — T4, FREE: Free T4: 1 ng/dL (ref 0.60–1.60)

## 2014-04-09 LAB — TSH: TSH: 0.75 u[IU]/mL (ref 0.35–4.50)

## 2014-05-02 ENCOUNTER — Ambulatory Visit: Payer: Self-pay | Admitting: Internal Medicine

## 2014-05-07 ENCOUNTER — Other Ambulatory Visit: Payer: Self-pay | Admitting: Obstetrics and Gynecology

## 2014-05-07 ENCOUNTER — Other Ambulatory Visit (HOSPITAL_COMMUNITY)
Admission: RE | Admit: 2014-05-07 | Discharge: 2014-05-07 | Disposition: A | Discharge status: Home / Self Care | Payer: 59 | Source: Ambulatory Visit | Attending: Obstetrics and Gynecology | Admitting: Obstetrics and Gynecology

## 2014-05-07 DIAGNOSIS — Z01419 Encounter for gynecological examination (general) (routine) without abnormal findings: Secondary | ICD-10-CM | POA: Diagnosis not present

## 2014-05-09 LAB — CYTOLOGY - PAP

## 2014-05-26 ENCOUNTER — Encounter: Payer: Self-pay | Admitting: Internal Medicine

## 2014-05-26 ENCOUNTER — Ambulatory Visit (INDEPENDENT_AMBULATORY_CARE_PROVIDER_SITE_OTHER): Payer: 59 | Admitting: Internal Medicine

## 2014-05-26 VITALS — BP 114/76 | HR 72 | Ht 61.0 in | Wt 197.8 lb

## 2014-05-26 DIAGNOSIS — J3089 Other allergic rhinitis: Principal | ICD-10-CM

## 2014-05-26 DIAGNOSIS — J309 Allergic rhinitis, unspecified: Secondary | ICD-10-CM

## 2014-05-26 DIAGNOSIS — J302 Other seasonal allergic rhinitis: Secondary | ICD-10-CM

## 2014-05-26 DIAGNOSIS — J45998 Other asthma: Secondary | ICD-10-CM

## 2014-05-26 NOTE — Patient Instructions (Signed)
Neb neo nasal  Depo 26  Ok to continue your usual meds including the Sudafed as needed  Consider a scarf to keep cold air out of your nose and mouth  Consider getting more water, and using a saline nasal spray for dryness as needed.

## 2014-05-26 NOTE — Progress Notes (Signed)
Patient ID: Katherine Tanner, female    DOB: [birth year], 53 y.o.   MRN: [id number]  HPI 29 yoF followed here for Allergic rhinitis, Asthma, and hx of GERD.  Last here May 31, 2010. With cold air she used her proventil rescue inhaler more. Now in last 2 weeks she has noted pollen effects with chest tightness, sinus pressure headaches, itching eyes. She continues the Advair 250. Must use rescue inhaler with exercise, 2-3x / week. Not wheezing at night.  We discussed her lisinopril/ ACE inhibitor.   05/02/11- 54 yoF followed here for Allergic rhinitis, Asthma, and hx of GERD.  She felt comfortable through the summer. In recent days has noted some itching of eyes and ears blamed on wind and fallen leaves. Needed to use her rescue inhaler today. Asks we sign a form confirming intolerance of flu vaccine for her employer. She required steroids for wheezing triggered by a flu vaccine in the past.  10/31/11- 57 yoF followed here for Allergic rhinitis, Asthma, and hx of GERD.  Likes Allegra better than Claritin this time of year, but feels very well controlled.  No problems with asthma. She is pleased with status.  05/01/12- 51yoF never smoker followed here for Allergic rhinitis, Asthma, and hx of GERD. Nasal area feels full-started on Sudafed and has been around sick coworkers. Breathing overall doing well.  Does not think she has a cold. Using Flonase with Sudafed. She declines flu vaccine because she feels she is "allergic", saying that anytime she takes it it makes her sick. She has a Neti pot which she has not been using. No wheezing and no recent reflux symptoms.  10/30/12- 89yoF never smoker followed here for Allergic rhinitis, Asthma, and hx of GERD Follows for: having some nasal congestion having issues with all the pollen. some sob set off by smells.  Says touching pollen on her car caused a contact rash. Close make her chest tight. Not active wheezing. Continues lisinopril which  we discussed again. Using Claritin twice daily. Rescue inhaler 2 times last month.  05/02/13- 52yoF never smoker followed here for Allergic rhinitis, Asthma, and hx of GERD FOLLOWS FOR: last week had a rough time-had to use inhalers; this week she is feeling better. She declines flu vaccine "allergic" Increased wheeze x2 weeks, it needing more rescue inhaler. Shortness of breath, wheeze and cough. Has continued Dulera. This week she feels much better, back at baseline. Eyes itch some, no nasal symptoms.  10/31/13- 53yoF never smoker followed here for Allergic rhinitis, Asthma, and hx of GERD FOLLOWS FOR:  slight increase sob due to change in weather.  no concerns today Pending right total knee replacement with Dr. Berenice Primas in May. Breathing quality varies. No sustained problem and medicines are adequate. Doesn't recognize much pollen problem and does not feel she has any infection. Insect sting on lip last year-wasp-swelling of the face. No previous history. She decided to wait on evaluation her EpiPen. We discussed avoidance of stinging insects.  05/26/14- 53yoF never smoker followed here for Allergic rhinitis, Asthma, and hx of GERD  declines flu shot "allergic" FOLLOWS UXL:KGMW weather changed patient started having sinus headaches and changes in her breathing. Would like to have her ears checked as well. One week of persistent frontal headache, right ear draining without pain, hearing okay, mild nasal congestion. Denies wheezing CXR 5/7/115 IMPRESSION: No acute cardiopulmonary disease. Electronically Signed  By: Abigail Miyamoto M.D.  On: 11/07/2013 15:41  Review of Systems-Per HPI.  Constitutional:   No-  weight loss, night sweats, fevers, chills, fatigue, lassitude. HEENT:   +  headaches, difficulty swallowing, tooth/dental problems, sore throat,       No-sneezing, itching, no-ear ache, + nasal congestion, no- post nasal drip,  CV:  No-   chest pain, orthopnea, PND, swelling in lower  extremities, anasarca, dizziness, palpitations Resp: No-   shortness of breath with exertion or at rest.              No-   productive cough,  No- non-productive cough,  No- coughing up of blood.              No-   change in color of mucus. No- wheezing.   Skin: + rash GI:  No-   heartburn, indigestion, abdominal pain, nausea, vomiting,  GU: . MS:  No-   joint pain or swelling.  . Neuro-     nothing unusual Psych:  No- change in mood or affect. No depression or anxiety.  No memory loss.  Objective:   Physical Exam General- Alert, Oriented, Affect-appropriate, Distress- none acute; overweight Skin- rash-none, lesions- none, excoriation- none Lymphadenopathy- none Head- atraumatic            Eyes- Gross vision intact, PERRLA, conjunctivae clear, no discolored secretions            Ears- Hearing, canals-normal            Nose-clear, no-Septal dev, mucus, polyps, erosion, perforation             Throat- Mallampati III , mucosa clear , drainage- none, tonsils- atrophic Neck- flexible , trachea midline, no stridor , thyroid nl, carotid no bruit Chest - symmetrical excursion , unlabored           Heart/CV- RRR , no murmur , no gallop  , no rub, nl s1 s2                           - JVD- none , edema- none, stasis changes- none, varices- none           Lung- clear to P&A, wheeze- none, cough- none , dullness-none, rub- none           Chest wall-  Abd- tender-no,  Br/ Gen/ Rectal- Not done, not indicated Extrem- cyanosis- none, clubbing, none, atrophy- none, strength- nl Neuro- grossly intact to observation

## 2014-05-27 DIAGNOSIS — J45998 Other asthma: Secondary | ICD-10-CM

## 2014-05-27 MED ORDER — PHENYLEPHRINE HCL 1 % NA SOLN
3.0000 [drp] | Freq: Once | NASAL | Status: AC
  Administered 2014-05-27: 3 [drp] via NASAL

## 2014-05-27 MED ORDER — METHYLPREDNISOLONE ACETATE 80 MG/ML IJ SUSP
80.0000 mg | Freq: Once | INTRAMUSCULAR | Status: AC
  Administered 2014-05-27: 80 mg via INTRAMUSCULAR

## 2014-05-30 NOTE — Assessment & Plan Note (Signed)
Mild intermittent well controlled. Discussed medications.

## 2014-05-30 NOTE — Assessment & Plan Note (Signed)
Exacerbation of rhinitis versus possible rhinosinusitis. If we can get good drainage we may avoid antibiotics. Plan-nasal nebulizer decongestant, Depo-Medrol

## 2014-08-19 ENCOUNTER — Encounter: Payer: Self-pay | Admitting: Family Medicine

## 2014-08-20 ENCOUNTER — Telehealth: Payer: Self-pay | Admitting: Family Medicine

## 2014-08-20 MED ORDER — ALPRAZOLAM 0.5 MG PO TABS
0.5000 mg | ORAL_TABLET | Freq: Every day | ORAL | Status: DC | PRN
Start: 2014-08-20 — End: 2015-12-03

## 2014-08-20 NOTE — Telephone Encounter (Signed)
Please call in [facility]an[facility] to [facility] Pt has anxiety-stress over mother being in hospice   Thanks

## 2014-08-20 NOTE — Telephone Encounter (Signed)
Called in rx to [facility] as instructed.

## 2014-09-02 ENCOUNTER — Encounter: Payer: Self-pay | Admitting: Internal Medicine

## 2014-09-02 ENCOUNTER — Telehealth: Payer: Self-pay | Admitting: Internal Medicine

## 2014-09-02 NOTE — Telephone Encounter (Signed)
Suggest we offer sample and print script for Breo Ellipta 100, # 1, 1 puff, then rinse mouth, once daily  This would replace Dulera. She will need training on Ellipta device

## 2014-09-02 NOTE — Telephone Encounter (Signed)
CY - please advise. Thanks! 

## 2014-09-02 NOTE — Telephone Encounter (Signed)
Received fax requesting PA for [facility]. Called [employer] at [phone]. Initiated PA for [employer]. Pt's ID#: [id number]. A decision will be made in 48-72 hours and will be notified of a decision by fax.   Will forward to Katie to follow up on.

## 2014-09-03 ENCOUNTER — Other Ambulatory Visit: Payer: Self-pay | Admitting: *Deleted

## 2014-09-03 MED ORDER — ALBUTEROL SULFATE HFA 108 (90 BASE) MCG/ACT IN AERS: 2.0000 | INHALATION_SPRAY | Freq: Four times a day (QID) | RESPIRATORY_TRACT | Status: DC | PRN

## 2014-09-04 NOTE — Telephone Encounter (Signed)
Insurance requires patient to try Advair, Breo, Symbicort... Patient has tried Advair in the past.  Patient is being switched to [facility] for trial (see TE 09/02/14).  Nothing further needed.

## 2014-10-04 ENCOUNTER — Encounter: Payer: Self-pay | Admitting: Internal Medicine

## 2014-10-06 MED ORDER — FLUTICASONE FUROATE-VILANTEROL 100-25 MCG/INH IN AEPB: 1.0000 | INHALATION_SPRAY | Freq: Every day | RESPIRATORY_TRACT | Status: DC

## 2014-10-06 NOTE — Telephone Encounter (Signed)
4.2.16 mychart message from pt: Message     HI    THE SAMPLE BREO SEEM TO WORK OK. CAN YOU SEND IN A PRESCRIPTION TO [facility].    THANKS    Breo started 3.1.16 per mychart email chain Also per that email chain, CY had stated to offer sample AND script for the [facility] but pt only received sample Rx sent to pharmacy Pt notified

## 2014-10-15 ENCOUNTER — Other Ambulatory Visit: Payer: Self-pay

## 2014-10-15 DIAGNOSIS — Z1231 Encounter for screening mammogram for malignant neoplasm of breast: Secondary | ICD-10-CM

## 2014-10-20 ENCOUNTER — Ambulatory Visit
Admission: RE | Admit: 2014-10-20 | Discharge: 2014-10-20 | Disposition: A | Discharge status: Home / Self Care | Payer: 59 | Source: Ambulatory Visit

## 2014-10-20 DIAGNOSIS — Z1231 Encounter for screening mammogram for malignant neoplasm of breast: Secondary | ICD-10-CM

## 2014-11-17 ENCOUNTER — Encounter: Payer: Self-pay | Admitting: Radiology

## 2014-11-17 ENCOUNTER — Encounter: Payer: Self-pay | Admitting: Family Medicine

## 2014-11-17 ENCOUNTER — Ambulatory Visit (INDEPENDENT_AMBULATORY_CARE_PROVIDER_SITE_OTHER): Payer: 59 | Admitting: Family Medicine

## 2014-11-17 VITALS — BP 136/86 | HR 77 | Temp 98.4°F | Ht 61.0 in | Wt 192.5 lb

## 2014-11-17 DIAGNOSIS — R739 Hyperglycemia, unspecified: Secondary | ICD-10-CM | POA: Diagnosis not present

## 2014-11-17 DIAGNOSIS — E785 Hyperlipidemia, unspecified: Secondary | ICD-10-CM | POA: Diagnosis not present

## 2014-11-17 DIAGNOSIS — I10 Essential (primary) hypertension: Secondary | ICD-10-CM | POA: Diagnosis not present

## 2014-11-17 NOTE — Patient Instructions (Signed)
Take care of yourself  Keep working on weight loss Continue low impact exercise  Labs today  Follow up in 6 months for annual exam with labs prior

## 2014-11-17 NOTE — Assessment & Plan Note (Signed)
bp in fair control at this time  BP Readings from Last 1 Encounters:  11/17/14 136/86   No changes needed Disc lifstyle change with low sodium diet and exercise  Lab reviewed  Enc to continue low impact exercise and wt loss

## 2014-11-17 NOTE — Progress Notes (Signed)
Pre visit review using our clinic review tool, if applicable. No additional management support is needed unless otherwise documented below in the visit note. 

## 2014-11-17 NOTE — Assessment & Plan Note (Signed)
A1C today  Disc low glycemic eating and exercise to prev DM Enc to continue wt loss

## 2014-11-17 NOTE — Progress Notes (Signed)
Subjective:    Patient ID: Katherine Tanner, female    DOB: 10-19-1960, 54 y.o.   MRN: [id number]  HPI Here for f/u of chronic medical problems   Has been feeling ok  Some sinus headaches  No fever or purulent drainage  Using flonase  Wt is down 5 lb with bmi of 73  Was exercising at the gym  Cut back a bit  Rode a bike at work today and walking   Has not needed her albuterol  Stays inside when the pollen is bad   bp is stable today  No cp or palpitations or headaches or edema  No side effects to medicines  BP Readings from Last 3 Encounters:  11/17/14 136/86  05/26/14 114/76  02/24/14 138/76    On hyzaar -no side effects   Hyperglycemia Lab Results  Component Value Date   HGBA1C 6.2 02/24/2014   Has been mindful of sugar in diet  Wants to re check this   Cholesterol Lab Results  Component Value Date   CHOL 196 02/24/2014   HDL 56.50 02/24/2014   LDLCALC 120* 02/24/2014   LDLDIRECT 147.9 12/17/2012   TRIG 100.0 02/24/2014   CHOLHDL 3 02/24/2014    Takes xanax infrequently - 1/2 pill yesterday  Does not take it recently - needed it after mother died   Patient Active Problem List   Diagnosis Date Noted  . Hyperglycemia 02/24/2014  . Leukocytosis, unspecified 02/24/2014  . Postoperative anemia due to acute blood loss 02/24/2014  . Osteoarthritis of right knee 11/15/2013  . Osteoarthritis of left knee 11/15/2013  . Hypokalemia 01/02/2012  . Alkaline phosphatase elevation 01/02/2012  . Hyperlipidemia 01/02/2012  . Leukocytosis 01/02/2012  . Routine general medical examination at a health care facility 12/09/2011  . 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 Neck pain 03/08/2011  . Low back pain 03/08/2011  . POLYARTHRITIS 01/28/2010  . ANGIOEDEMA 03/27/2007  . Essential hypertension 03/19/2007  . Perennial allergic rhinitis with seasonal variation 03/19/2007  . Allergic-infective asthma 03/19/2007  . GERD 03/19/2007  . HIATAL HERNIA  03/19/2007  . GESTATIONAL DIABETES 03/19/2007   Past Medical History  Diagnosis Date  . Allergy     takes Claritin daily and Flonase daily  . Asthma     uses Albuterol daily as needed;Singulair nightly;DUlera daily  . Hypertension     takes Hyzaar daily  . PONV (postoperative nausea and vomiting)   . Pneumonia     hx of;last time at least 104yrs ago  . History of bronchitis     last time 43yrs ago  . Arthritis   . Joint pain   . Joint swelling   . H/O hiatal hernia   . GERD (gastroesophageal reflux disease)     occasionally will take OTC meds but states she just watches what she eats  . History of colon polyps   . History of shingles    Past Surgical History  Procedure Laterality Date  . Tubal ligation    . Right ganglion cyst      x 2  . Cardiac catheterization    . Knee surgery Right     arthroscopy  . Right lumpectomy  around 1983  . Colonoscopy    . Esophagogastroduodenoscopy    . Total knee arthroplasty Right 11/15/2013    Procedure: RIGHT TOTAL KNEE ARTHROPLASTY;  Surgeon: Alta Corning, MD;  Location: [place];  Service: Orthopedics;  Laterality: Right;  . Steriod injection Left 11/15/2013    Procedure:  Marcaine/STEROID INJECTION;  Surgeon: Alta Corning, MD;  Location: [place];  Service: Orthopedics;  Laterality: Left;   History  Substance Use Topics  . Smoking status: Never Smoker   . Smokeless tobacco: Not on file  . Alcohol Use: No   Family History  Problem Relation Age of Onset  . Diabetes Father   . Sarcoidosis Mother   . Glaucoma Mother   . Hypertension Mother    Allergies  Allergen Reactions  . Ace Inhibitors     Cough/ breathing problems   . Amoxicillin-Pot Clavulanate     REACTION: swelling  . Influenza Virus Vacc Split Pf    Current Outpatient Prescriptions on File Prior to Visit  Medication Sig Dispense Refill  . albuterol (PROVENTIL HFA;VENTOLIN HFA) 108 (90 BASE) MCG/ACT inhaler Inhale 2 puffs into the lungs every 6 (six) hours as needed for  wheezing or shortness of breath. 18 g 6  . ALPRAZolam (XANAX) 0.5 MG tablet Take 1 tablet (0.5 mg total) by mouth daily as needed for anxiety. 30 tablet 1  . fluticasone (FLONASE) 50 MCG/ACT nasal spray Place 2 sprays into both nostrils daily. 48 g 3  . Fluticasone Furoate-Vilanterol (BREO ELLIPTA) 100-25 MCG/INH AEPB Inhale 1 puff into the lungs daily. 60 each 5  . guaiFENesin (MUCINEX) 600 MG 12 hr tablet Take by mouth 2 (two) times daily as needed.     . loratadine (CLARITIN) 10 MG tablet Take 10 mg by mouth daily as needed for allergies.     [name] losartan-hydrochlorothiazide (HYZAAR) 50-12.5 MG per tablet Take 1 tablet by mouth daily. 90 tablet 3  . montelukast (SINGULAIR) 10 MG tablet Take 1 tablet (10 mg total) by mouth at bedtime. 90 tablet 3  . Multiple Vitamin (MULTIVITAMIN PO) Take 1 tablet by mouth daily.     . pseudoephedrine (SUDAFED) 30 MG tablet Take 30 mg by mouth every 4 (four) hours as needed for congestion.     . traMADol (ULTRAM) 50 MG tablet Take 50-100 mg by mouth every 6 (six) hours as needed for moderate pain.      No current facility-administered medications on file prior to visit.    Review of Systems Review of Systems  Constitutional: Negative for fever, appetite change, fatigue and unexpected weight change.  Eyes: Negative for pain and visual disturbance.  ENt pos for episodic all rhinitis  Respiratory: Negative for cough and shortness of breath.  neg for recent wheezing  Cardiovascular: Negative for cp or palpitations    Gastrointestinal: Negative for nausea, diarrhea and constipation.  Genitourinary: Negative for urgency and frequency.  Skin: Negative for pallor or rash   Neurological: Negative for weakness, light-headedness, numbness and headaches.  Hematological: Negative for adenopathy. Does not bruise/bleed easily.  Psychiatric/Behavioral: Negative for dysphoric mood. The patient is not nervous/anxious.         Objective:   Physical Exam  Constitutional:  She appears well-developed and well-nourished. No distress.  obese and well appearing   HENT:  Head: Normocephalic and atraumatic.  Right Ear: External ear normal.  Left Ear: External ear normal.  Mouth/Throat: Oropharynx is clear and moist.  Boggy nares   Eyes: Conjunctivae and EOM are normal. Pupils are equal, round, and reactive to light.  Neck: Normal range of motion. Neck supple. No JVD present. Carotid bruit is not present. No thyromegaly present.  Cardiovascular: Normal rate, regular rhythm, normal heart sounds and intact distal pulses.  Exam reveals no gallop.   Pulmonary/Chest: Effort normal and breath sounds normal.  No respiratory distress. She has no wheezes. She has no rales.  No crackles  Abdominal: Soft. Bowel sounds are normal. She exhibits no distension, no abdominal bruit and no mass. There is no tenderness.  Musculoskeletal: She exhibits no edema.  Limited rom knees   Lymphadenopathy:    She has no cervical adenopathy.  Neurological: She is alert. She has normal reflexes.  Skin: Skin is warm and dry. No rash noted.  Psychiatric: She has a normal mood and affect.          Assessment & Plan:   Problem List Items Addressed This Visit    Essential hypertension - Primary    bp in fair control at this time  BP Readings from Last 1 Encounters:  11/17/14 136/86   No changes needed Disc lifstyle change with low sodium diet and exercise  Lab reviewed  Enc to continue low impact exercise and wt loss       Hyperglycemia    A1C today  Disc low glycemic eating and exercise to prev DM Enc to continue wt loss       Relevant Orders   Hemoglobin A1c   Hyperlipidemia    Disc goals for lipids and reasons to control them Rev labs with pt from last check  Lipid panel today-diet has improved  Rev low sat fat diet in detail       Relevant Orders   Lipid panel

## 2014-11-17 NOTE — Assessment & Plan Note (Signed)
Disc goals for lipids and reasons to control them Rev labs with pt from last check  Lipid panel today-diet has improved  Rev low sat fat diet in detail

## 2014-11-18 LAB — LIPID PANEL
Cholesterol: 215 mg/dL — ABNORMAL HIGH (ref 0–200)
HDL: 70.6 mg/dL (ref >39.00)
LDL CALC: 131 mg/dL — AB (ref 0–99)
NONHDL: 144.4
Total CHOL/HDL Ratio: 3
Triglycerides: 67 mg/dL (ref 0.0–149.0)
VLDL: 13.4 mg/dL (ref 0.0–40.0)

## 2014-11-18 LAB — HEMOGLOBIN A1C: HEMOGLOBIN A1C: 6 % (ref 4.6–6.5)

## 2014-11-24 ENCOUNTER — Ambulatory Visit (INDEPENDENT_AMBULATORY_CARE_PROVIDER_SITE_OTHER): Payer: 59 | Admitting: Internal Medicine

## 2014-11-24 ENCOUNTER — Encounter: Payer: Self-pay | Admitting: Internal Medicine

## 2014-11-24 VITALS — BP 114/60 | HR 80 | Ht 61.0 in | Wt 192.0 lb

## 2014-11-24 DIAGNOSIS — J309 Allergic rhinitis, unspecified: Secondary | ICD-10-CM | POA: Diagnosis not present

## 2014-11-24 DIAGNOSIS — J302 Other seasonal allergic rhinitis: Secondary | ICD-10-CM

## 2014-11-24 DIAGNOSIS — J45998 Other asthma: Secondary | ICD-10-CM | POA: Diagnosis not present

## 2014-11-24 DIAGNOSIS — J3089 Other allergic rhinitis: Secondary | ICD-10-CM

## 2014-11-24 MED ORDER — MONTELUKAST SODIUM 10 MG PO TABS: 10.0000 mg | ORAL_TABLET | Freq: Every day | ORAL | Status: DC

## 2014-11-24 NOTE — Patient Instructions (Addendum)
Order- office spirometry  Dx  allergic-infective asthma  Singulair refilled  Please call as needed

## 2014-11-24 NOTE — Progress Notes (Signed)
Patient ID: Katherine Tanner, female    DOB: 03-17-1961, 54 y.o.   MRN: [id number]  HPI 80 yoF followed here for Allergic rhinitis, Asthma, and hx of GERD.  Last here May 31, 2010. With cold air she used her proventil rescue inhaler more. Now in last 2 weeks she has noted pollen effects with chest tightness, sinus pressure headaches, itching eyes. She continues the Advair 250. Must use rescue inhaler with exercise, 2-3x / week. Not wheezing at night.  We discussed her lisinopril/ ACE inhibitor.   05/02/11- 11 yoF followed here for Allergic rhinitis, Asthma, and hx of GERD.  She felt comfortable through the summer. In recent days has noted some itching of eyes and ears blamed on wind and fallen leaves. Needed to use her rescue inhaler today. Asks we sign a form confirming intolerance of flu vaccine for her employer. She required steroids for wheezing triggered by a flu vaccine in the past.  10/31/11- 89 yoF followed here for Allergic rhinitis, Asthma, and hx of GERD.  Likes Allegra better than Claritin this time of year, but feels very well controlled.  No problems with asthma. She is pleased with status.  05/01/12- 51yoF never smoker followed here for Allergic rhinitis, Asthma, and hx of GERD. Nasal area feels full-started on Sudafed and has been around sick coworkers. Breathing overall doing well.  Does not think she has a cold. Using Flonase with Sudafed. She declines flu vaccine because she feels she is "allergic", saying that anytime she takes it it makes her sick. She has a Neti pot which she has not been using. No wheezing and no recent reflux symptoms.  10/30/12- 73yoF never smoker followed here for Allergic rhinitis, Asthma, and hx of GERD Follows for: having some nasal congestion having issues with all the pollen. some sob set off by smells.  Says touching pollen on her car caused a contact rash. Close make her chest tight. Not active wheezing. Continues lisinopril which  we discussed again. Using Claritin twice daily. Rescue inhaler 2 times last month.  05/02/13- 52yoF never smoker followed here for Allergic rhinitis, Asthma, and hx of GERD FOLLOWS FOR: last week had a rough time-had to use inhalers; this week she is feeling better. She declines flu vaccine "allergic" Increased wheeze x2 weeks, it needing more rescue inhaler. Shortness of breath, wheeze and cough. Has continued Dulera. This week she feels much better, back at baseline. Eyes itch some, no nasal symptoms.  10/31/13- 53yoF never smoker followed here for Allergic rhinitis, Asthma, and hx of GERD FOLLOWS FOR:  slight increase sob due to change in weather.  no concerns today Pending right total knee replacement with Dr. Berenice Primas in May. Breathing quality varies. No sustained problem and medicines are adequate. Doesn't recognize much pollen problem and does not feel she has any infection. Insect sting on lip last year-wasp-swelling of the face. No previous history. She decided to wait on evaluation her EpiPen. We discussed avoidance of stinging insects.  05/26/14- 53yoF never smoker followed here for Allergic rhinitis, Asthma, and hx of GERD  declines flu shot "allergic" FOLLOWS XVQ:MGQQ weather changed patient started having sinus headaches and changes in her breathing. Would like to have her ears checked as well. One week of persistent frontal headache, right ear draining without pain, hearing okay, mild nasal congestion. Denies wheezing CXR 5/7/115 IMPRESSION: No acute cardiopulmonary disease. Electronically Signed  By: Abigail Miyamoto M.D.  On: 11/07/2013 15:41  11/24/14- 54yoF never smoker followed here for Allergic rhinitis,  Asthma, and hx of GERD  declines flu shot "allergic" Asthma is getting better when inside. when outside SOB. Weather sensitive. Using Sudafed as needed for nasal congestion. Chest pretty good. Handicap parking helps. Mother died of sarcoid so we spent some time discussing the  condition but she has not been recognized as having at herself. Office spirometry: 11/24/2014: WNL FVC 2.39/97%, FEV1 2.09/103%, FEV1/FVC 0.88, FEF 25-75 percent 3.27/122%.   Review of Systems-Per HPI.  Constitutional:   No-   weight loss, night sweats, fevers, chills, fatigue, lassitude. HEENT:   +  headaches, difficulty swallowing, tooth/dental problems, sore throat,       No-sneezing, itching, no-ear ache, + nasal congestion, no- post nasal drip,  CV:  No-   chest pain, orthopnea, PND, swelling in lower extremities, anasarca, dizziness, palpitations Resp: No-   shortness of breath with exertion or at rest.              No-   productive cough,  No- non-productive cough,  No- coughing up of blood.              No-   change in color of mucus. No- wheezing.   Skin: + rash GI:  No-   heartburn, indigestion, abdominal pain, nausea, vomiting,  GU: . MS:  No-   joint pain or swelling.  . Neuro-     nothing unusual Psych:  No- change in mood or affect. No depression or anxiety.  No memory loss.  Objective:   Physical Exam General- Alert, Oriented, Affect-appropriate, Distress- none acute; overweight Skin- rash-none, lesions- none, excoriation- none Lymphadenopathy- none Head- atraumatic            Eyes- Gross vision intact, PERRLA, conjunctivae clear, no discolored secretions            Ears- Hearing, canals-normal            Nose- +Mucosa looks a little granular and pale, no-Septal dev, mucus, polyps, erosion, perforation             Throat- Mallampati III , mucosa clear , drainage- none, tonsils- atrophic Neck- flexible , trachea midline, no stridor , thyroid nl, carotid no bruit Chest - symmetrical excursion , unlabored           Heart/CV- RRR , no murmur , no gallop  , no rub, nl s1 s2                           - JVD- none , edema- none, stasis changes- none, varices- none           Lung- clear to P&A, wheeze- none, cough- none , dullness-none, rub- none           Chest wall-  Abd-  tender-no,  Br/ Gen/ Rectal- Not done, not indicated Extrem- cyanosis- none, clubbing, none, atrophy- none, strength- nl Neuro- grossly intact to observation

## 2014-11-24 NOTE — Assessment & Plan Note (Signed)
Fair control through spring. She has needed decongestant. Stress medications.

## 2014-11-24 NOTE — Assessment & Plan Note (Signed)
She remains under good control through pollen season. We discussed triggers.

## 2014-12-16 ENCOUNTER — Encounter: Payer: Self-pay | Admitting: Family Medicine

## 2015-02-07 ENCOUNTER — Encounter: Payer: 59 | Attending: Family Medicine | Admitting: Skilled Nursing Facility1

## 2015-02-07 ENCOUNTER — Encounter: Payer: Self-pay | Admitting: Skilled Nursing Facility1

## 2015-02-07 VITALS — Ht 60.0 in | Wt 192.0 lb

## 2015-02-07 DIAGNOSIS — Z713 Dietary counseling and surveillance: Secondary | ICD-10-CM | POA: Insufficient documentation

## 2015-02-07 NOTE — Progress Notes (Signed)
Patient was seen on 02/07/15 for the Weight Loss Class at the [facility]. The following learning objectives were met by the patient during this class:   Describe healthy choices in each food group  Describe portion size of foods  Use plate method for meal planning  Demonstrate how to read Nutrition Facts food label  Set realistic goals for weight loss, diet changes, and physical activity.   Goals:  1. Make healthy food choices in each food group.  2. Reduce portion size of foods.  3. Increase fruit and vegetable intake.  4. Use plate method for meal planning.  5. Increase physical activity.    Handouts given:   1. Nutrition Strategies for Weight Loss   2. Meal plan/portion card   3. MyPlate Planner   4. Weight Management Recipe Resources   5. Bake, Broil, Grill   

## 2015-02-10 ENCOUNTER — Emergency Department (HOSPITAL_COMMUNITY)
Admission: EM | Admit: 2015-02-10 | Discharge: 2015-02-10 | Disposition: A | Discharge status: Home / Self Care | Payer: 59 | Attending: Emergency Medicine | Admitting: Emergency Medicine

## 2015-02-10 ENCOUNTER — Encounter (HOSPITAL_COMMUNITY): Payer: Self-pay

## 2015-02-10 DIAGNOSIS — Z880 Allergy status to penicillin: Secondary | ICD-10-CM | POA: Diagnosis not present

## 2015-02-10 DIAGNOSIS — Z8619 Personal history of other infectious and parasitic diseases: Secondary | ICD-10-CM | POA: Insufficient documentation

## 2015-02-10 DIAGNOSIS — Z86010 Personal history of colonic polyps: Secondary | ICD-10-CM | POA: Diagnosis not present

## 2015-02-10 DIAGNOSIS — Y998 Other external cause status: Secondary | ICD-10-CM | POA: Diagnosis not present

## 2015-02-10 DIAGNOSIS — K219 Gastro-esophageal reflux disease without esophagitis: Secondary | ICD-10-CM | POA: Insufficient documentation

## 2015-02-10 DIAGNOSIS — Z8701 Personal history of pneumonia (recurrent): Secondary | ICD-10-CM | POA: Insufficient documentation

## 2015-02-10 DIAGNOSIS — S0990XA Unspecified injury of head, initial encounter: Secondary | ICD-10-CM | POA: Diagnosis not present

## 2015-02-10 DIAGNOSIS — S39012A Strain of muscle, fascia and tendon of lower back, initial encounter: Secondary | ICD-10-CM | POA: Insufficient documentation

## 2015-02-10 DIAGNOSIS — S161XXA Strain of muscle, fascia and tendon at neck level, initial encounter: Secondary | ICD-10-CM | POA: Diagnosis not present

## 2015-02-10 DIAGNOSIS — Y9389 Activity, other specified: Secondary | ICD-10-CM | POA: Diagnosis not present

## 2015-02-10 DIAGNOSIS — Z9889 Other specified postprocedural states: Secondary | ICD-10-CM | POA: Diagnosis not present

## 2015-02-10 DIAGNOSIS — Z7951 Long term (current) use of inhaled steroids: Secondary | ICD-10-CM | POA: Insufficient documentation

## 2015-02-10 DIAGNOSIS — Y92410 Unspecified street and highway as the place of occurrence of the external cause: Secondary | ICD-10-CM | POA: Diagnosis not present

## 2015-02-10 DIAGNOSIS — J45909 Unspecified asthma, uncomplicated: Secondary | ICD-10-CM | POA: Diagnosis not present

## 2015-02-10 DIAGNOSIS — S3992XA Unspecified injury of lower back, initial encounter: Secondary | ICD-10-CM | POA: Diagnosis present

## 2015-02-10 DIAGNOSIS — M1990 Unspecified osteoarthritis, unspecified site: Secondary | ICD-10-CM | POA: Insufficient documentation

## 2015-02-10 DIAGNOSIS — I10 Essential (primary) hypertension: Secondary | ICD-10-CM | POA: Diagnosis not present

## 2015-02-10 DIAGNOSIS — Z79899 Other long term (current) drug therapy: Secondary | ICD-10-CM | POA: Diagnosis not present

## 2015-02-10 MED ORDER — HYDROCODONE-ACETAMINOPHEN 5-325 MG PO TABS: 1.0000 | ORAL_TABLET | Freq: Four times a day (QID) | ORAL | Status: DC | PRN
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
## 2015-02-10 NOTE — Discharge Instructions (Signed)
Lumbosacral Strain Lumbosacral strain is a strain of any of the parts that make up your lumbosacral vertebrae. Your lumbosacral vertebrae are the bones that make up the lower third of your backbone. Your lumbosacral vertebrae are held together by muscles and tough, fibrous tissue (ligaments).  CAUSES  A sudden blow to your back can cause lumbosacral strain. Also, anything that causes an excessive stretch of the muscles in the low back can cause this strain. This is typically seen when people exert themselves strenuously, fall, lift heavy objects, bend, or crouch repeatedly. RISK FACTORS  Physically demanding work.  Participation in pushing or pulling sports or sports that require a sudden twist of the back (tennis, golf, baseball).  Weight lifting.  Excessive lower back curvature.  Forward-tilted pelvis.  Weak back or abdominal muscles or both.  Tight hamstrings. SIGNS AND SYMPTOMS  Lumbosacral strain may cause pain in the area of your injury or pain that moves (radiates) down your leg.  DIAGNOSIS Your health care provider can often diagnose lumbosacral strain through a physical exam. In some cases, you may need tests such as X-ray exams.  TREATMENT  Treatment for your lower back injury depends on many factors that your clinician will have to evaluate. However, most treatment will include the use of anti-inflammatory medicines. HOME CARE INSTRUCTIONS   Avoid hard physical activities (tennis, racquetball, waterskiing) if you are not in proper physical condition for it. This may aggravate or create problems.  If you have a back problem, avoid sports requiring sudden body movements. Swimming and walking are generally safer activities.  Maintain good posture.  Maintain a healthy weight.  For acute conditions, you may put ice on the injured area.  Put ice in a plastic bag.  Place a towel between your skin and the bag.  Leave the ice on for 20 minutes, 2-3 times a day.  When the  low back starts healing, stretching and strengthening exercises may be recommended. SEEK MEDICAL CARE IF:  Your back pain is getting worse.  You experience severe back pain not relieved with medicines. SEEK IMMEDIATE MEDICAL CARE IF:   You have numbness, tingling, weakness, or problems with the use of your arms or legs.  There is a change in bowel or bladder control.  You have increasing pain in any area of the body, including your belly (abdomen).  You notice shortness of breath, dizziness, or feel faint.  You feel sick to your stomach (nauseous), are throwing up (vomiting), or become sweaty.  You notice discoloration of your toes or legs, or your feet get very cold. MAKE SURE YOU:   Understand these instructions.  Will watch your condition.  Will get help right away if you are not doing well or get worse. Document Released: 03/30/2005 Document Revised: 06/25/2013 Document Reviewed: 02/06/2013 [facility] Patient Information 2015 [place], [place]. This information is not intended to replace advice given to you by your health care provider. Make sure you discuss any questions you have with your health care provider. Cervical Sprain A cervical sprain is an injury in the neck in which the strong, fibrous tissues (ligaments) that connect your neck bones stretch or tear. Cervical sprains can range from mild to severe. Severe cervical sprains can cause the neck vertebrae to be unstable. This can lead to damage of the spinal cord and can result in serious nervous system problems. The amount of time it takes for a cervical sprain to get better depends on the cause and extent of the injury. Most cervical  sprains heal in 1 to 3 weeks. CAUSES  Severe cervical sprains may be caused by:   Contact sport injuries (such as from football, rugby, wrestling, hockey, auto racing, gymnastics, diving, martial arts, or boxing).   Motor vehicle collisions.   Whiplash injuries. This is an injury from a  sudden forward and backward whipping movement of the head and neck.  Falls.  Mild cervical sprains may be caused by:   Being in an awkward position, such as while cradling a telephone between your ear and shoulder.   Sitting in a chair that does not offer proper support.   Working at a poorly Land[employer] station.   Looking up or down for long periods of time.  SYMPTOMS   Pain, soreness, stiffness, or a burning sensation in the front, back, or sides of the neck. This discomfort may develop immediately after the injury or slowly, 24 hours or more after the injury.   Pain or tenderness directly in the middle of the back of the neck.   Shoulder or upper back pain.   Limited ability to move the neck.   Headache.   Dizziness.   Weakness, numbness, or tingling in the hands or arms.   Muscle spasms.   Difficulty swallowing or chewing.   Tenderness and swelling of the neck.  DIAGNOSIS  Most of the time your health care provider can diagnose a cervical sprain by taking your history and doing a physical exam. Your health care provider will ask about previous neck injuries and any known neck problems, such as arthritis in the neck. X-rays may be taken to find out if there are any other problems, such as with the bones of the neck. Other tests, such as a CT scan or MRI, may also be needed.  TREATMENT  Treatment depends on the severity of the cervical sprain. Mild sprains can be treated with rest, keeping the neck in place (immobilization), and pain medicines. Severe cervical sprains are immediately immobilized. Further treatment is done to help with pain, muscle spasms, and other symptoms and may include:  Medicines, such as pain relievers, numbing medicines, or muscle relaxants.   Physical therapy. This may involve stretching exercises, strengthening exercises, and posture training. Exercises and improved posture can help stabilize the neck, strengthen muscles, and  help stop symptoms from returning.  HOME CARE INSTRUCTIONS   Put ice on the injured area.   Put ice in a plastic bag.   Place a towel between your skin and the bag.   Leave the ice on for 15-20 minutes, 3-4 times a day.   If your injury was severe, you may have been given a cervical collar to wear. A cervical collar is a two-piece collar designed to keep your neck from moving while it heals.  Do not remove the collar unless instructed by your health care provider.  If you have long hair, keep it outside of the collar.  Ask your health care provider before making any adjustments to your collar. Minor adjustments may be required over time to improve comfort and reduce pressure on your chin or on the back of your head.  Ifyou are allowed to remove the collar for cleaning or bathing, follow your health care provider's instructions on how to do so safely.  Keep your collar clean by wiping it with mild soap and water and drying it completely. If the collar you have been given includes removable pads, remove them every 1-2 days and hand wash them with soap and  water. Allow them to air dry. They should be completely dry before you wear them in the collar.  If you are allowed to remove the collar for cleaning and bathing, wash and dry the skin of your neck. Check your skin for irritation or sores. If you see any, tell your health care provider.  Do not drive while wearing the collar.   Only take over-the-counter or prescription medicines for pain, discomfort, or fever as directed by your health care provider.   Keep all follow-up appointments as directed by your health care provider.   Keep all physical therapy appointments as directed by your health care provider.   Make any needed adjustments to your workstation to promote good posture.   Avoid positions and activities that make your symptoms worse.   Warm up and stretch before being active to help prevent problems.  SEEK  MEDICAL CARE IF:   Your pain is not controlled with medicine.   You are unable to decrease your pain medicine over time as planned.   Your activity level is not improving as expected.  SEEK IMMEDIATE MEDICAL CARE IF:   You develop any bleeding.  You develop stomach upset.  You have signs of an allergic reaction to your medicine.   Your symptoms get worse.   You develop new, unexplained symptoms.   You have numbness, tingling, weakness, or paralysis in any part of your body.  MAKE SURE YOU:   Understand these instructions.  Will watch your condition.  Will get help right away if you are not doing well or get worse. Document Released: 04/17/2007 Document Revised: 06/25/2013 Document Reviewed: 12/26/2012 [facility] Patient Information 2015 [place], [place]. This information is not intended to replace advice given to you by your health care provider. Make sure you discuss any questions you have with your health care provider.

## 2015-02-11 ENCOUNTER — Ambulatory Visit (INDEPENDENT_AMBULATORY_CARE_PROVIDER_SITE_OTHER)
Admission: RE | Admit: 2015-02-11 | Discharge: 2015-02-11 | Disposition: A | Discharge status: Home / Self Care | Payer: 59 | Source: Ambulatory Visit | Attending: Primary Care | Admitting: Primary Care

## 2015-02-11 ENCOUNTER — Ambulatory Visit (INDEPENDENT_AMBULATORY_CARE_PROVIDER_SITE_OTHER): Payer: 59 | Admitting: Primary Care

## 2015-02-11 ENCOUNTER — Encounter: Payer: Self-pay | Admitting: Primary Care

## 2015-02-11 VITALS — BP 126/76 | HR 69 | Temp 98.2°F | Ht 61.0 in | Wt 192.1 lb

## 2015-02-11 DIAGNOSIS — M545 Low back pain, unspecified: Secondary | ICD-10-CM

## 2015-02-11 DIAGNOSIS — M62838 Other muscle spasm: Secondary | ICD-10-CM

## 2015-02-11 DIAGNOSIS — M542 Cervicalgia: Secondary | ICD-10-CM | POA: Diagnosis not present

## 2015-02-11 MED ORDER — CYCLOBENZAPRINE HCL 5 MG PO TABS: 5.0000 mg | ORAL_TABLET | Freq: Every day | ORAL | Status: DC
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
## 2015-02-11 NOTE — Progress Notes (Signed)
Pre visit review using our clinic review tool, if applicable. No additional management support is needed unless otherwise documented below in the visit note. 

## 2015-02-24 ENCOUNTER — Other Ambulatory Visit: Payer: Self-pay | Admitting: Family Medicine

## 2015-02-24 NOTE — Telephone Encounter (Signed)
Px written for call in   

## 2015-02-24 NOTE — Telephone Encounter (Signed)
Received refill request electronically Last office visit 02/11/15/acute See allergy/contraindication Is it okay to refill?

## 2015-03-20 ENCOUNTER — Telehealth: Payer: Self-pay | Admitting: Internal Medicine

## 2015-03-20 NOTE — Telephone Encounter (Signed)
Placed handicap placard with phone message on CY's cart.

## 2015-03-20 NOTE — Telephone Encounter (Signed)
Called and left message on voicemail advising patient that form has been completed and Katie place the form at the front to be picked up. Nothing further needed.

## 2015-03-20 NOTE — Telephone Encounter (Signed)
Handicap placard placed upfront for pick up.

## 2015-03-20 NOTE — Telephone Encounter (Signed)
Done

## 2015-04-09 ENCOUNTER — Other Ambulatory Visit: Payer: Self-pay | Admitting: Internal Medicine

## 2015-05-12 ENCOUNTER — Other Ambulatory Visit: Payer: Self-pay | Admitting: Obstetrics and Gynecology

## 2015-05-12 ENCOUNTER — Other Ambulatory Visit (HOSPITAL_COMMUNITY)
Admission: RE | Admit: 2015-05-12 | Discharge: 2015-05-12 | Disposition: A | Discharge status: Home / Self Care | Payer: 59 | Source: Ambulatory Visit | Attending: Obstetrics and Gynecology | Admitting: Obstetrics and Gynecology

## 2015-05-12 DIAGNOSIS — Z01419 Encounter for gynecological examination (general) (routine) without abnormal findings: Secondary | ICD-10-CM | POA: Diagnosis present

## 2015-05-13 LAB — CYTOLOGY - PAP

## 2015-06-01 ENCOUNTER — Encounter: Payer: Self-pay | Admitting: Internal Medicine

## 2015-06-01 ENCOUNTER — Ambulatory Visit (INDEPENDENT_AMBULATORY_CARE_PROVIDER_SITE_OTHER): Payer: 59 | Admitting: Internal Medicine

## 2015-06-01 VITALS — BP 110/72 | HR 77 | Ht 61.0 in | Wt 193.0 lb

## 2015-06-01 DIAGNOSIS — J45998 Other asthma: Secondary | ICD-10-CM

## 2015-06-01 MED ORDER — FLUTICASONE FUROATE-VILANTEROL 100-25 MCG/INH IN AEPB: INHALATION_SPRAY | RESPIRATORY_TRACT | Status: DC

## 2015-06-01 MED ORDER — MONTELUKAST SODIUM 10 MG PO TABS: 10.0000 mg | ORAL_TABLET | Freq: Every day | ORAL | Status: DC

## 2015-06-01 MED ORDER — ALBUTEROL SULFATE HFA 108 (90 BASE) MCG/ACT IN AERS: 2.0000 | INHALATION_SPRAY | Freq: Four times a day (QID) | RESPIRATORY_TRACT | Status: DC | PRN

## 2015-06-01 NOTE — Progress Notes (Signed)
Patient ID: Katherine Tanner, female    DOB: 03-17-1961, 54 y.o.   MRN: [id number]  HPI 80 yoF followed here for Allergic rhinitis, Asthma, and hx of GERD.  Last here May 31, 2010. With cold air she used her proventil rescue inhaler more. Now in last 2 weeks she has noted pollen effects with chest tightness, sinus pressure headaches, itching eyes. She continues the Advair 250. Must use rescue inhaler with exercise, 2-3x / week. Not wheezing at night.  We discussed her lisinopril/ ACE inhibitor.   05/02/11- 11 yoF followed here for Allergic rhinitis, Asthma, and hx of GERD.  She felt comfortable through the summer. In recent days has noted some itching of eyes and ears blamed on wind and fallen leaves. Needed to use her rescue inhaler today. Asks we sign a form confirming intolerance of flu vaccine for her employer. She required steroids for wheezing triggered by a flu vaccine in the past.  10/31/11- 89 yoF followed here for Allergic rhinitis, Asthma, and hx of GERD.  Likes Allegra better than Claritin this time of year, but feels very well controlled.  No problems with asthma. She is pleased with status.  05/01/12- 51yoF never smoker followed here for Allergic rhinitis, Asthma, and hx of GERD. Nasal area feels full-started on Sudafed and has been around sick coworkers. Breathing overall doing well.  Does not think she has a cold. Using Flonase with Sudafed. She declines flu vaccine because she feels she is "allergic", saying that anytime she takes it it makes her sick. She has a Neti pot which she has not been using. No wheezing and no recent reflux symptoms.  10/30/12- 73yoF never smoker followed here for Allergic rhinitis, Asthma, and hx of GERD Follows for: having some nasal congestion having issues with all the pollen. some sob set off by smells.  Says touching pollen on her car caused a contact rash. Close make her chest tight. Not active wheezing. Continues lisinopril which  we discussed again. Using Claritin twice daily. Rescue inhaler 2 times last month.  05/02/13- 52yoF never smoker followed here for Allergic rhinitis, Asthma, and hx of GERD FOLLOWS FOR: last week had a rough time-had to use inhalers; this week she is feeling better. She declines flu vaccine "allergic" Increased wheeze x2 weeks, it needing more rescue inhaler. Shortness of breath, wheeze and cough. Has continued Dulera. This week she feels much better, back at baseline. Eyes itch some, no nasal symptoms.  10/31/13- 53yoF never smoker followed here for Allergic rhinitis, Asthma, and hx of GERD FOLLOWS FOR:  slight increase sob due to change in weather.  no concerns today Pending right total knee replacement with Dr. Berenice Primas in May. Breathing quality varies. No sustained problem and medicines are adequate. Doesn't recognize much pollen problem and does not feel she has any infection. Insect sting on lip last year-wasp-swelling of the face. No previous history. She decided to wait on evaluation her EpiPen. We discussed avoidance of stinging insects.  05/26/14- 53yoF never smoker followed here for Allergic rhinitis, Asthma, and hx of GERD  declines flu shot "allergic" FOLLOWS XVQ:MGQQ weather changed patient started having sinus headaches and changes in her breathing. Would like to have her ears checked as well. One week of persistent frontal headache, right ear draining without pain, hearing okay, mild nasal congestion. Denies wheezing CXR 5/7/115 IMPRESSION: No acute cardiopulmonary disease. Electronically Signed  By: Abigail Miyamoto M.D.  On: 11/07/2013 15:41  11/24/14- 54yoF never smoker followed here for Allergic rhinitis,  Asthma, and hx of GERD  declines flu shot "allergic" Asthma is getting better when inside. when outside SOB. Weather sensitive. Using Sudafed as needed for nasal congestion. Chest pretty good. Handicap parking helps. Mother died of sarcoid so we spent some time discussing the  condition but she has not been recognized as having at herself. Office spirometry: 11/24/2014: WNL FVC 2.39/97%, FEV1 2.09/103%, FEV1/FVC 0.88, FEF 25-75 percent 3.27/122%.  06/01/2015-54 year old female never smoker followed for allergic rhinitis, asthma, history of GERD FOLLOWS FOR: Pt states with cooler weather change she is having more chest tightness. Needs refills. Declines flu shot "allergic"    Review of Systems-Per HPI.  Constitutional:   No-   weight loss, night sweats, fevers, chills, fatigue, lassitude. HEENT:   +  headaches, difficulty swallowing, tooth/dental problems, sore throat,       No-sneezing, itching, no-ear ache, + nasal congestion, no- post nasal drip,  CV:  No-   chest pain, orthopnea, PND, swelling in lower extremities, anasarca, dizziness, palpitations Resp: No-   shortness of breath with exertion or at rest.              No-   productive cough,  No- non-productive cough,  No- coughing up of blood.              No-   change in color of mucus. No- wheezing.   Skin: + rash GI:  No-   heartburn, indigestion, abdominal pain, nausea, vomiting,  GU: . MS:  No-   joint pain or swelling.  . Neuro-     nothing unusual Psych:  No- change in mood or affect. No depression or anxiety.  No memory loss.  Objective:   Physical Exam General- Alert, Oriented, Affect-appropriate, Distress- none acute; overweight Skin- rash-none, lesions- none, excoriation- none Lymphadenopathy- none Head- atraumatic            Eyes- Gross vision intact, PERRLA, conjunctivae clear, no discolored secretions            Ears- Hearing, canals-normal            Nose- +Mucosa looks a little granular and pale, no-Septal dev, mucus, polyps, erosion, perforation             Throat- Mallampati III , mucosa clear , drainage- none, tonsils- atrophic Neck- flexible , trachea midline, no stridor , thyroid nl, carotid no bruit Chest - symmetrical excursion , unlabored           Heart/CV- RRR , no murmur ,  no gallop  , no rub, nl s1 s2                           - JVD- none , edema- none, stasis changes- none, varices- none           Lung- clear to P&A, wheeze- none, cough- none , dullness-none, rub- none           Chest wall-  Abd- tender-no,  Br/ Gen/ Rectal- Not done, not indicated Extrem- cyanosis- none, clubbing, none, atrophy- none, strength- nl Neuro- grossly intact to observation

## 2015-06-01 NOTE — Patient Instructions (Signed)
Refill scripts sent.

## 2015-06-04 ENCOUNTER — Telehealth: Payer: Self-pay | Admitting: Family Medicine

## 2015-06-04 DIAGNOSIS — R739 Hyperglycemia, unspecified: Secondary | ICD-10-CM

## 2015-06-04 DIAGNOSIS — Z0000 Encounter for general adult medical examination without abnormal findings: Secondary | ICD-10-CM

## 2015-06-04 NOTE — Telephone Encounter (Signed)
-----   Message from Marchia Bond sent at 06/02/2015  4:08 PM EST ----- Regarding: Cpx labs Fri 12/2, need orders, Thanks! :-) Please order  future cpx labs for pt's upcoming lab appt. Thanks Aniceto Boss

## 2015-06-05 ENCOUNTER — Other Ambulatory Visit (INDEPENDENT_AMBULATORY_CARE_PROVIDER_SITE_OTHER): Payer: 59

## 2015-06-05 DIAGNOSIS — Z0000 Encounter for general adult medical examination without abnormal findings: Secondary | ICD-10-CM | POA: Diagnosis not present

## 2015-06-05 DIAGNOSIS — R739 Hyperglycemia, unspecified: Secondary | ICD-10-CM | POA: Diagnosis not present

## 2015-06-05 LAB — COMPREHENSIVE METABOLIC PANEL
ALT: 35 U/L (ref 0–35)
AST: 25 U/L (ref 0–37)
Albumin: 4 g/dL (ref 3.5–5.2)
Alkaline Phosphatase: 169 U/L — ABNORMAL HIGH (ref 39–117)
BUN: 20 mg/dL (ref 6–23)
CO2: 31 meq/L (ref 19–32)
Calcium: 9.8 mg/dL (ref 8.4–10.5)
Chloride: 100 mEq/L (ref 96–112)
Creatinine, Ser: 0.64 mg/dL (ref 0.40–1.20)
GFR: 124.95 mL/min (ref >60.00)
GLUCOSE: 86 mg/dL (ref 70–99)
POTASSIUM: 3.8 meq/L (ref 3.5–5.1)
Sodium: 139 mEq/L (ref 135–145)
Total Bilirubin: 0.4 mg/dL (ref 0.2–1.2)
Total Protein: 7.5 g/dL (ref 6.0–8.3)

## 2015-06-05 LAB — LIPID PANEL
CHOL/HDL RATIO: 3
Cholesterol: 200 mg/dL (ref 0–200)
HDL: 57.8 mg/dL (ref >39.00)
LDL CALC: 122 mg/dL — AB (ref 0–99)
NONHDL: 142.65
TRIGLYCERIDES: 105 mg/dL (ref 0.0–149.0)
VLDL: 21 mg/dL (ref 0.0–40.0)

## 2015-06-05 LAB — CBC WITH DIFFERENTIAL/PLATELET
BASOS PCT: 0.5 % (ref 0.0–3.0)
Basophils Absolute: 0 10*3/uL (ref 0.0–0.1)
Eosinophils Absolute: 0.2 10*3/uL (ref 0.0–0.7)
Eosinophils Relative: 2.1 % (ref 0.0–5.0)
HEMATOCRIT: 39.3 % (ref 36.0–46.0)
HEMOGLOBIN: 13 g/dL (ref 12.0–15.0)
LYMPHS PCT: 36.1 % (ref 12.0–46.0)
Lymphs Abs: 2.7 10*3/uL (ref 0.7–4.0)
MCHC: 33 g/dL (ref 30.0–36.0)
MCV: 87.3 fl (ref 78.0–100.0)
MONOS PCT: 10.3 % (ref 3.0–12.0)
Monocytes Absolute: 0.8 10*3/uL (ref 0.1–1.0)
NEUTROS ABS: 3.8 10*3/uL (ref 1.4–7.7)
Neutrophils Relative %: 51 % (ref 43.0–77.0)
PLATELETS: 210 10*3/uL (ref 150.0–400.0)
RBC: 4.51 Mil/uL (ref 3.87–5.11)
RDW: 14.1 % (ref 11.5–15.5)
WBC: 7.4 10*3/uL (ref 4.0–10.5)

## 2015-06-05 LAB — HEMOGLOBIN A1C: Hgb A1c MFr Bld: 6.1 % (ref 4.6–6.5)

## 2015-06-05 LAB — TSH: TSH: 0.63 u[IU]/mL (ref 0.35–4.50)

## 2015-06-10 ENCOUNTER — Encounter: Payer: Self-pay | Admitting: Family Medicine

## 2015-06-10 ENCOUNTER — Ambulatory Visit (INDEPENDENT_AMBULATORY_CARE_PROVIDER_SITE_OTHER): Payer: 59 | Admitting: Family Medicine

## 2015-06-10 VITALS — BP 118/62 | HR 83 | Temp 98.2°F | Ht 60.5 in | Wt 192.2 lb

## 2015-06-10 DIAGNOSIS — Z0000 Encounter for general adult medical examination without abnormal findings: Secondary | ICD-10-CM | POA: Diagnosis not present

## 2015-06-10 DIAGNOSIS — R748 Abnormal levels of other serum enzymes: Secondary | ICD-10-CM

## 2015-06-10 DIAGNOSIS — E785 Hyperlipidemia, unspecified: Secondary | ICD-10-CM

## 2015-06-10 DIAGNOSIS — R739 Hyperglycemia, unspecified: Secondary | ICD-10-CM

## 2015-06-10 DIAGNOSIS — I10 Essential (primary) hypertension: Secondary | ICD-10-CM

## 2015-06-10 DIAGNOSIS — E876 Hypokalemia: Secondary | ICD-10-CM

## 2015-06-10 NOTE — Progress Notes (Signed)
Subjective:    Patient ID: Katherine Tanner, female    DOB: 01-Mar-1961, 54 y.o.   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  Has gone to the nutritionist  Keeps trying to loose weight   Declines HIV and Hep C screen  Mm 4/16 normal Self breast exam-no lumps   Gyn exam and pap nl 11/16 gyn  occ spotting / thinks she is menopausal    Td 6/13  Colonoscopy 12/14 - 5 year recall    Cannot have flu shot -allergic reaction   bp is stable today  No cp or palpitations or headaches or edema  No side effects to medicines  BP Readings from Last 3 Encounters:  06/10/15 118/62  06/01/15 110/72  02/11/15 126/76     Alk phos is 169 Isoenzymes in past ok Chronically elevated No abd pain - feels ok   Hyperglycemia Lab Results  Component Value Date   HGBA1C 6.1 06/05/2015  is careful about sugar in her diet - avoids sweets  Avoids bread  occ potato    Hyperlipidemia Lab Results  Component Value Date   CHOL 200 06/05/2015   CHOL 215* 11/17/2014   CHOL 196 02/24/2014   Lab Results  Component Value Date   HDL 57.80 06/05/2015   HDL 70.60 11/17/2014   HDL 56.50 02/24/2014   Lab Results  Component Value Date   LDLCALC 122* 06/05/2015   LDLCALC 131* 11/17/2014   LDLCALC 120* 02/24/2014   Lab Results  Component Value Date   TRIG 105.0 06/05/2015   TRIG 67.0 11/17/2014   TRIG 100.0 02/24/2014   Lab Results  Component Value Date   CHOLHDL 3 06/05/2015   CHOLHDL 3 11/17/2014   CHOLHDL 3 02/24/2014   Lab Results  Component Value Date   LDLDIRECT 147.9 12/17/2012   LDLDIRECT 138.3 12/09/2011    Doing a good job with diet  LDL is down  HDL is generally good - she is exercising regularly / loves fish     Chemistry      Component Value Date/Time   NA 139 06/05/2015 1420   K 3.8 06/05/2015 1420   CL  100 06/05/2015 1420   CO2 31 06/05/2015 1420   BUN 20 06/05/2015 1420   CREATININE 0.64 06/05/2015 1420      Component Value Date/Time   CALCIUM 9.8 06/05/2015 1420   ALKPHOS 169* 06/05/2015 1420   AST 25 06/05/2015 1420   ALT 35 06/05/2015 1420   BILITOT 0.4 06/05/2015 1420      Lab Results  Component Value Date   TSH 0.63 06/05/2015       Review of Systems Review of Systems  Constitutional: Negative for fever, appetite change, fatigue and unexpected weight change.  Eyes: Negative for pain and visual disturbance.  ENT pos for int R ear pain  Respiratory: Negative for cough and shortness of breath.   Cardiovascular: Negative for cp or palpitations    Gastrointestinal: Negative for nausea, diarrhea and constipation.  Genitourinary: Negative for urgency and frequency.  Skin: Negative for pallor or rash   Neurological: Negative for weakness, light-headedness, numbness and headaches.  Hematological: Negative for adenopathy. Does not bruise/bleed easily.  Psychiatric/Behavioral: Negative for dysphoric mood. The patient is not nervous/anxious.         Objective:  Physical Exam  Constitutional: She appears well-developed and well-nourished. No distress.  obese and well appearing   HENT:  Head: Normocephalic and atraumatic.  Right Ear: External ear normal.  Left Ear: External ear normal.  Nose: Nose normal.  Mouth/Throat: Oropharynx is clear and moist.  Eyes: Conjunctivae and EOM are normal. Pupils are equal, round, and reactive to light. Right eye exhibits no discharge. Left eye exhibits no discharge. No scleral icterus.  Neck: Normal range of motion. Neck supple. No JVD present. Carotid bruit is not present. No thyromegaly present.  Cardiovascular: Normal rate, regular rhythm, normal heart sounds and intact distal pulses.  Exam reveals no gallop.   Pulmonary/Chest: Effort normal and breath sounds normal. No respiratory distress. She has no wheezes. She has no rales.    Abdominal: Soft. Bowel sounds are normal. She exhibits no distension and no mass. There is no tenderness.  Musculoskeletal: She exhibits no edema or tenderness.  Lymphadenopathy:    She has no cervical adenopathy.  Neurological: She is alert. She has normal reflexes. No cranial nerve deficit. She exhibits normal muscle tone. Coordination normal.  Skin: Skin is warm and dry. No rash noted. No erythema. No pallor.  Psychiatric: She has a normal mood and affect.          Assessment & Plan:   Problem List Items Addressed This Visit      Cardiovascular and Mediastinum   Essential hypertension - Primary    bp in fair control at this time  BP Readings from Last 1 Encounters:  06/10/15 118/62   No changes needed Disc lifstyle change with low sodium diet and exercise  Doing well with hyzaar and exercise Enc wt loss         Other   Alkaline phosphatase elevation    Fluctuates -chronic  Rev last panel of isoenzymes No symptoms  Continue to follow        Hyperglycemia    Stable  Lab Results  Component Value Date   HGBA1C 6.1 06/05/2015   Enc wt loss and low glycemic diet to prevent DM      Hyperlipidemia    LDL is slt improved Enc low sat fat diet   Disc goals for lipids and reasons to control them Rev labs with pt Rev low sat fat diet in detail       RESOLVED: Hypokalemia   Routine general medical examination at a health care facility    Reviewed health habits including diet and exercise and skin cancer prevention Reviewed appropriate screening tests for age  Also reviewed health mt list, fam hx and immunization status , as well as social and family history   See HPI Labs reviewed  Continue to watch alk phos   Enc continued exercise and wt loss

## 2015-06-10 NOTE — Assessment & Plan Note (Signed)
Reviewed health habits including diet and exercise and skin cancer prevention Reviewed appropriate screening tests for age  Also reviewed health mt list, fam hx and immunization status , as well as social and family history   See HPI Labs reviewed  Continue to watch alk phos   Enc continued exercise and wt loss  

## 2015-06-10 NOTE — Assessment & Plan Note (Signed)
LDL is slt improved Enc low sat fat diet   Disc goals for lipids and reasons to control them Rev labs with pt Rev low sat fat diet in detail

## 2015-06-10 NOTE — Patient Instructions (Signed)
Labs are stable  We will continue to watch your alk phos level  Reduce sugar and starches in diet to prevent diabetes Keep loosing weight and exercising   Take care of yourself

## 2015-06-10 NOTE — Assessment & Plan Note (Signed)
bp in fair control at this time  BP Readings from Last 1 Encounters:  06/10/15 118/62   No changes needed Disc lifstyle change with low sodium diet and exercise  Doing well with hyzaar and exercise Enc wt loss

## 2015-06-10 NOTE — Assessment & Plan Note (Signed)
Stable  Lab Results  Component Value Date   HGBA1C 6.1 06/05/2015   Enc wt loss and low glycemic diet to prevent DM

## 2015-06-10 NOTE — Progress Notes (Signed)
Pre visit review using our clinic review tool, if applicable. No additional management support is needed unless otherwise documented below in the visit note. 

## 2015-06-10 NOTE — Assessment & Plan Note (Signed)
Fluctuates -chronic  Rev last panel of isoenzymes No symptoms  Continue to follow

## 2015-07-08 MED FILL — CLINDAMYCIN HCL 300 MG CAPS: 300 | 1 days supply | Qty: 2 | Fill #0

## 2015-07-08 MED FILL — BREO ELLIPTA 100-25 MCG INH: 100-25 | 30 days supply | Qty: 60 | Fill #1

## 2015-07-22 MED FILL — MELOXICAM 15 MG TABLET: 15 | 30 days supply | Qty: 30 | Fill #1

## 2015-07-23 MED FILL — traMADol HCL 50 MG TABS: 50 | 15 days supply | Qty: 120 | Fill #0

## 2015-08-07 MED FILL — BREO ELLIPTA 100-25 MCG INH: 100-25 | 30 days supply | Qty: 60 | Fill #2

## 2015-08-26 ENCOUNTER — Encounter: Payer: Self-pay | Admitting: Internal Medicine

## 2015-08-26 ENCOUNTER — Ambulatory Visit (INDEPENDENT_AMBULATORY_CARE_PROVIDER_SITE_OTHER): Payer: 59 | Admitting: Internal Medicine

## 2015-08-26 ENCOUNTER — Encounter: Payer: Self-pay | Admitting: *Deleted

## 2015-08-26 VITALS — BP 124/76 | HR 87 | Temp 98.2°F | Ht 61.0 in | Wt 192.0 lb

## 2015-08-26 DIAGNOSIS — J4541 Moderate persistent asthma with (acute) exacerbation: Secondary | ICD-10-CM

## 2015-08-26 DIAGNOSIS — J45998 Other asthma: Secondary | ICD-10-CM

## 2015-08-26 MED ORDER — LEVALBUTEROL HCL 0.63 MG/3ML IN NEBU
0.6300 mg | INHALATION_SOLUTION | Freq: Once | RESPIRATORY_TRACT | Status: AC
  Administered 2015-08-26: 0.63 mg via RESPIRATORY_TRACT

## 2015-08-26 MED ORDER — METHYLPREDNISOLONE ACETATE 80 MG/ML IJ SUSP
80.0000 mg | Freq: Once | INTRAMUSCULAR | Status: AC
  Administered 2015-08-26: 80 mg via INTRAMUSCULAR

## 2015-08-26 MED ORDER — FLUTICASONE FUROATE-VILANTEROL 200-25 MCG/INH IN AEPB: 1.0000 | INHALATION_SPRAY | Freq: Every day | RESPIRATORY_TRACT | Status: DC

## 2015-08-26 NOTE — Patient Instructions (Addendum)
Work note- today and tomorrow, return to work  Friday, 2/ 24  Neb xop 0.63   Acute asthmatic bronchitis Depo 80  Sample Breo 200     Inhale 1 puff then rinse mouth well, once daily     When sample runs out go back to [facility] to keep June appointment unless need help sooner

## 2015-08-26 NOTE — Assessment & Plan Note (Signed)
Nonspecific asthmatic bronchitis exacerbation. This may be viral or related to increasing spring tree pollen levels, or both. It does not fit obvious flu pattern at this time.. Tussive substernal pain seems musculoskeletal. I don't see evidence of pericarditis. Pattern does not suggest PE. Plan-Depo-Medrol, nebulizer treatment with Xopenex, sample Breo 200 , leave of absence note

## 2015-08-26 NOTE — Progress Notes (Signed)
Patient ID: Katherine Tanner, female    DOB: 03-17-1961, 55 y.o.   MRN: [id number]  HPI 80 yoF followed here for Allergic rhinitis, Asthma, and hx of GERD.  Last here May 31, 2010. With cold air she used her proventil rescue inhaler more. Now in last 2 weeks she has noted pollen effects with chest tightness, sinus pressure headaches, itching eyes. She continues the Advair 250. Must use rescue inhaler with exercise, 2-3x / week. Not wheezing at night.  We discussed her lisinopril/ ACE inhibitor.   05/02/11- 11 yoF followed here for Allergic rhinitis, Asthma, and hx of GERD.  She felt comfortable through the summer. In recent days has noted some itching of eyes and ears blamed on wind and fallen leaves. Needed to use her rescue inhaler today. Asks we sign a form confirming intolerance of flu vaccine for her employer. She required steroids for wheezing triggered by a flu vaccine in the past.  10/31/11- 89 yoF followed here for Allergic rhinitis, Asthma, and hx of GERD.  Likes Allegra better than Claritin this time of year, but feels very well controlled.  No problems with asthma. She is pleased with status.  05/01/12- 51yoF never smoker followed here for Allergic rhinitis, Asthma, and hx of GERD. Nasal area feels full-started on Sudafed and has been around sick coworkers. Breathing overall doing well.  Does not think she has a cold. Using Flonase with Sudafed. She declines flu vaccine because she feels she is "allergic", saying that anytime she takes it it makes her sick. She has a Neti pot which she has not been using. No wheezing and no recent reflux symptoms.  10/30/12- 73yoF never smoker followed here for Allergic rhinitis, Asthma, and hx of GERD Follows for: having some nasal congestion having issues with all the pollen. some sob set off by smells.  Says touching pollen on her car caused a contact rash. Close make her chest tight. Not active wheezing. Continues lisinopril which  we discussed again. Using Claritin twice daily. Rescue inhaler 2 times last month.  05/02/13- 52yoF never smoker followed here for Allergic rhinitis, Asthma, and hx of GERD FOLLOWS FOR: last week had a rough time-had to use inhalers; this week she is feeling better. She declines flu vaccine "allergic" Increased wheeze x2 weeks, it needing more rescue inhaler. Shortness of breath, wheeze and cough. Has continued Dulera. This week she feels much better, back at baseline. Eyes itch some, no nasal symptoms.  10/31/13- 53yoF never smoker followed here for Allergic rhinitis, Asthma, and hx of GERD FOLLOWS FOR:  slight increase sob due to change in weather.  no concerns today Pending right total knee replacement with Dr. Berenice Primas in May. Breathing quality varies. No sustained problem and medicines are adequate. Doesn't recognize much pollen problem and does not feel she has any infection. Insect sting on lip last year-wasp-swelling of the face. No previous history. She decided to wait on evaluation her EpiPen. We discussed avoidance of stinging insects.  05/26/14- 53yoF never smoker followed here for Allergic rhinitis, Asthma, and hx of GERD  declines flu shot "allergic" FOLLOWS XVQ:MGQQ weather changed patient started having sinus headaches and changes in her breathing. Would like to have her ears checked as well. One week of persistent frontal headache, right ear draining without pain, hearing okay, mild nasal congestion. Denies wheezing CXR 5/7/115 IMPRESSION: No acute cardiopulmonary disease. Electronically Signed  By: Abigail Miyamoto M.D.  On: 11/07/2013 15:41  11/24/14- 54yoF never smoker followed here for Allergic rhinitis,  Asthma, and hx of GERD  declines flu shot "allergic" Asthma is getting better when inside. when outside SOB. Weather sensitive. Using Sudafed as needed for nasal congestion. Chest pretty good. Handicap parking helps. Mother died of sarcoid so we spent some time discussing the  condition but she has not been recognized as having it herself. Office spirometry: 11/24/2014: WNL FVC 2.39/97%, FEV1 2.09/103%, FEV1/FVC 0.88, FEF 25-75 percent 3.27/122%.  06/01/2015-55 year old female never smoker followed for allergic rhinitis, asthma, history of GERD FOLLOWS FOR: Pt states with cooler weather change she is having more chest tightness. Needs refills. Declines flu shot "allergic"  08/26/2015-55 year old female never smoker followed here for Allergic rhinitis, Asthma, complicated by GERD ACUTE VISIT: Pt states she is coughing-productive-white in color, upper back and chest pain-started yesterday. SOB as well. Over past 2 days his increasing use of rescue inhaler. Cough productive white sputum. Tussive soreness through sternum to back. Denies fever, leg pain or swelling, palpitation, myalgias or chills  Review of Systems-Per HPI.  Constitutional:   No-   weight loss, night sweats, fevers, chills, fatigue, lassitude. HEENT:   +  headaches, difficulty swallowing, tooth/dental problems, sore throat,       No-sneezing, itching, no-ear ache, + nasal congestion, no- post nasal drip,  CV:  + chest pain, orthopnea, PND, swelling in lower extremities, anasarca, dizziness, palpitations Resp: No-   shortness of breath with exertion or at rest.            + productive cough,  No- non-productive cough,  No- coughing up of blood.              No-   change in color of mucus. No- wheezing.   Skin: + rash GI:  No-   heartburn, indigestion, abdominal pain, nausea, vomiting,  GU: . MS:  No-   joint pain or swelling.  . Neuro-     nothing unusual Psych:  No- change in mood or affect. No depression or anxiety.  No memory loss.  Objective:   Physical Exam General- Alert, Oriented, Affect-appropriate, Distress- none acute; overweight, + wearing paper mask Skin- rash-none, lesions- none, excoriation- none Lymphadenopathy- none Head- atraumatic            Eyes- Gross vision intact, PERRLA,  conjunctivae clear, no discolored secretions            Ears- Hearing, canals-normal            Nose- +Mucosa looks a little granular and pale, no-Septal dev, mucus, polyps, erosion, perforation             Throat- Mallampati III , mucosa clear , drainage- none, tonsils- atrophic Neck- flexible , trachea midline, no stridor , thyroid nl, carotid no bruit Chest - symmetrical excursion , unlabored           Heart/CV- RRR , no murmur , no gallop  , no rub, nl s1 s2                           - JVD- none , edema- none, stasis changes- none, varices- none           Lung- clear to P&A, wheeze- none, cough + dry , dullness-none, rub- none           Chest wall-  Abd- tender-no,  Br/ Gen/ Rectal- Not done, not indicated Extrem- cyanosis- none, clubbing, none, atrophy- none, strength- nl Neuro- grossly intact to observation

## 2015-08-27 ENCOUNTER — Encounter: Payer: Self-pay | Admitting: Internal Medicine

## 2015-08-27 MED ORDER — OSELTAMIVIR PHOSPHATE 75 MG PO CAPS: 75.0000 mg | ORAL_CAPSULE | Freq: Two times a day (BID) | ORAL | Status: DC

## 2015-08-27 MED FILL — OSELTAMIVIR PHOS 75 MG CAP: 75 | 5 days supply | Qty: 10 | Fill #0

## 2015-08-27 NOTE — Telephone Encounter (Signed)
Patient sent message through [facility] - this is a sick message

## 2015-08-27 NOTE — Telephone Encounter (Signed)
Offer Tamiflu 75 mg, # 10, 1 twice daily  Ok to rewrite her LOA note extending until next Monday

## 2015-08-27 NOTE — Addendum Note (Signed)
Addended by: Inge Rise on: 08/27/2015 12:09 PM   Modules accepted: Orders

## 2015-08-27 NOTE — Telephone Encounter (Signed)
Called spoke with pt. She is aware of recs below. I advised will leave work note for pick up but reports she is unable to pick this up. She reports if she ends up needing this then she will call back but for now note is not needed.

## 2015-09-09 MED FILL — traMADol HCL 50 MG TABS: 50 | 15 days supply | Qty: 120 | Fill #0

## 2015-09-09 MED FILL — MELOXICAM 15 MG TABLET: 15 | 30 days supply | Qty: 30 | Fill #0

## 2015-09-09 MED FILL — LOSARTAN-HCTZ 50-12.5 MG TA: 50-12.5 | 90 days supply | Qty: 90 | Fill #2

## 2015-09-14 MED FILL — MONTELUKAST SOD 10 MG TAB: 10 | 90 days supply | Qty: 90 | Fill #3

## 2015-09-22 MED FILL — BREO ELLIPTA 100-25 MCG INH: 100-25 | 30 days supply | Qty: 60 | Fill #3

## 2015-10-19 ENCOUNTER — Other Ambulatory Visit: Payer: Self-pay

## 2015-10-19 DIAGNOSIS — Z1231 Encounter for screening mammogram for malignant neoplasm of breast: Secondary | ICD-10-CM

## 2015-10-22 MED FILL — MELOXICAM 15 MG TABLET: 15 | 30 days supply | Qty: 30 | Fill #1

## 2015-10-23 MED FILL — BREO ELLIPTA 100-25 MCG INH: 100-25 | 30 days supply | Qty: 60 | Fill #4

## 2015-10-30 MED FILL — traMADol HCL 50 MG TABS: 50 | 15 days supply | Qty: 120 | Fill #0

## 2015-11-03 ENCOUNTER — Ambulatory Visit
Admission: RE | Admit: 2015-11-03 | Discharge: 2015-11-03 | Disposition: A | Discharge status: Home / Self Care | Payer: 59 | Source: Ambulatory Visit

## 2015-11-03 DIAGNOSIS — Z1231 Encounter for screening mammogram for malignant neoplasm of breast: Secondary | ICD-10-CM

## 2015-11-10 DIAGNOSIS — M25562 Pain in left knee: Secondary | ICD-10-CM | POA: Diagnosis not present

## 2015-11-10 DIAGNOSIS — M25561 Pain in right knee: Secondary | ICD-10-CM | POA: Diagnosis not present

## 2015-11-23 MED FILL — BREO ELLIPTA 100-25 MCG INH: 100-25 | 30 days supply | Qty: 60 | Fill #5

## 2015-11-30 MED FILL — LOSARTAN-HCTZ 50-12.5 MG TA: 50-12.5 | 90 days supply | Qty: 90 | Fill #3

## 2015-12-01 ENCOUNTER — Encounter: Payer: Self-pay | Admitting: Family Medicine

## 2015-12-03 ENCOUNTER — Ambulatory Visit (INDEPENDENT_AMBULATORY_CARE_PROVIDER_SITE_OTHER): Payer: 59 | Admitting: Internal Medicine

## 2015-12-03 ENCOUNTER — Encounter: Payer: Self-pay | Admitting: Internal Medicine

## 2015-12-03 VITALS — BP 124/70 | HR 72 | Ht 61.0 in | Wt 192.8 lb

## 2015-12-03 DIAGNOSIS — J309 Allergic rhinitis, unspecified: Secondary | ICD-10-CM

## 2015-12-03 DIAGNOSIS — J4521 Mild intermittent asthma with (acute) exacerbation: Secondary | ICD-10-CM

## 2015-12-03 DIAGNOSIS — J3089 Other allergic rhinitis: Secondary | ICD-10-CM

## 2015-12-03 DIAGNOSIS — J302 Other seasonal allergic rhinitis: Secondary | ICD-10-CM

## 2015-12-03 DIAGNOSIS — J45998 Other asthma: Secondary | ICD-10-CM

## 2015-12-03 DIAGNOSIS — IMO0001 Reserved for inherently not codable concepts without codable children: Secondary | ICD-10-CM

## 2015-12-03 MED ORDER — COMPRESSOR/NEBULIZER MISC: Status: AC

## 2015-12-03 MED ORDER — IPRATROPIUM-ALBUTEROL 0.5-2.5 (3) MG/3ML IN SOLN: 3.0000 mL | Freq: Four times a day (QID) | RESPIRATORY_TRACT | Status: DC | PRN

## 2015-12-03 MED ORDER — METHYLPREDNISOLONE ACETATE 80 MG/ML IJ SUSP
80.0000 mg | Freq: Once | INTRAMUSCULAR | Status: AC
  Administered 2015-12-03: 80 mg via INTRAMUSCULAR

## 2015-12-03 MED ORDER — EPINEPHRINE 0.3 MG/0.3ML IJ SOAJ: 0.3000 mg | Freq: Once | INTRAMUSCULAR | Status: DC

## 2015-12-03 MED ORDER — FLUTICASONE FUROATE-VILANTEROL 200-25 MCG/INH IN AEPB: 1.0000 | INHALATION_SPRAY | Freq: Every day | RESPIRATORY_TRACT | Status: DC

## 2015-12-03 MED ORDER — LEVALBUTEROL HCL 0.63 MG/3ML IN NEBU
0.6300 mg | INHALATION_SOLUTION | Freq: Once | RESPIRATORY_TRACT | Status: AC
  Administered 2015-12-03: 0.63 mg via RESPIRATORY_TRACT

## 2015-12-03 MED FILL — EPINEPHRINE 0.3 MG AUTO-INJ: 0.3 | 30 days supply | Qty: 2 | Fill #0

## 2015-12-03 NOTE — Patient Instructions (Signed)
Neb xop 0.63     Dx asthma exacerbation  Depo 80  Scripts printed for nebulizer machine and duoneb neb solution  Script/ coupon for epipen  Sample Breo 200    Inhale 1 puff then rinse mouth well, once daily.  When this sample runs out, return to your Breo 100.

## 2015-12-03 NOTE — Progress Notes (Signed)
Patient ID: Katherine Tanner, female    DOB: 02-16-1961, 55 y.o.   MRN: [id number]  HPI  05/26/14- 62yoF never smoker followed here for Allergic rhinitis, Asthma, and hx of GERD  declines flu shot "allergic" FOLLOWS [id number] weather changed patient started having sinus headaches and changes in her breathing. Would like to have her ears checked as well. One week of persistent frontal headache, right ear draining without pain, hearing okay, mild nasal congestion. Denies wheezing CXR 5/7/115 IMPRESSION: No acute cardiopulmonary disease. Electronically Signed  By: Abigail Miyamoto M.D.  On: 11/07/2013 15:41  11/24/14- 54yoF never smoker followed here for Allergic rhinitis, Asthma, and hx of GERD  declines flu shot "allergic" Asthma is getting better when inside. when outside SOB. Weather sensitive. Using Sudafed as needed for nasal congestion. Chest pretty good. Handicap parking helps. Mother died of sarcoid so we spent some time discussing the condition but she has not been recognized as having it herself. Office spirometry: 11/24/2014: WNL FVC 2.39/97%, FEV1 2.09/103%, FEV1/FVC 0.88, FEF 25-75 percent 3.27/122%.  06/01/2015-55 year old female never smoker followed for allergic rhinitis, asthma, history of GERD FOLLOWS FOR: Pt states with cooler weather change she is having more chest tightness. Needs refills. Declines flu shot "allergic"  08/26/2015-55 year old female never smoker followed here for Allergic rhinitis, Asthma, complicated by GERD ACUTE VISIT: Pt states she is coughing-productive-white in color, upper back and chest pain-started yesterday. SOB as well. Over past 2 days his increasing use of rescue inhaler. Cough productive white sputum. Tussive soreness through sternum to back. Denies fever, leg pain or swelling, palpitation, myalgias or chills  12/03/2015-55 year old female never smoker followed for Allergic rhinitis, Asthma, complicated by GERD FOLLOWS FOR: Pt states  she has had increase in wheezing past few weeks along with SOB at times.  Increased nasal congestion and drainage. Blames "weather". She would like her own nebulizer machine and an EpiPen.  Review of Systems-Per HPI.  Constitutional:   No-   weight loss, night sweats, fevers, chills, fatigue, lassitude. HEENT:   +  headaches, difficulty swallowing, tooth/dental problems, sore throat,       No-sneezing, itching, no-ear ache, + nasal congestion, no- post nasal drip,  CV:  + chest pain, orthopnea, PND, swelling in lower extremities, anasarca, dizziness, palpitations Resp: No-   shortness of breath with exertion or at rest.            + productive cough,  No- non-productive cough,  No- coughing up of blood.              No-   change in color of mucus. No- wheezing.   Skin: + rash GI:  No-   heartburn, indigestion, abdominal pain, nausea, vomiting,  GU: . MS:  No-   joint pain or swelling.  . Neuro-     nothing unusual Psych:  No- change in mood or affect. No depression or anxiety.  No memory loss.  Objective:   Physical Exam General- Alert, Oriented, Affect-appropriate, Distress- none acute; overweight,  Skin- rash-none, lesions- none, excoriation- none Lymphadenopathy- none Head- atraumatic            Eyes- Gross vision intact, PERRLA, conjunctivae clear, no discolored secretions            Ears- Hearing, canals-normal            Nose-+ Sniffing, no-Septal dev, mucus, polyps, erosion, perforation             Throat- Mallampati III , mucosa clear , drainage- none,  tonsils- atrophic Neck- flexible , trachea midline, no stridor , thyroid nl, carotid no bruit Chest - symmetrical excursion , unlabored           Heart/CV- RRR , no murmur , no gallop  , no rub, nl s1 s2                           - JVD- none , edema- none, stasis changes- none, varices- none           Lung- clear to P&A, wheeze- none, cough + dry , dullness-none, rub- none           Chest wall-  Abd- tender-no,  Br/ Gen/  Rectal- Not done, not indicated Extrem- cyanosis- none, clubbing, none, atrophy- none, strength- nl Neuro- grossly intact to observation

## 2015-12-04 DIAGNOSIS — J45998 Other asthma: Secondary | ICD-10-CM | POA: Diagnosis not present

## 2015-12-04 MED FILL — IPRAT-ALBUT 0.5-3(2.5) MG/3: 0.5-2.5 (3) | 7 days supply | Qty: 90 | Fill #0

## 2015-12-06 NOTE — Assessment & Plan Note (Signed)
Exacerbation over the past month consistent with spring pollen and weather changes as she believes. Plan-treated with steroids also covering asthma.

## 2015-12-06 NOTE — Assessment & Plan Note (Signed)
Exacerbation Plan-nebulizer treatments Xopenex, Depo-Medrol, sample Breo 200, then return to 100 Prescription for nebulizer machine and DuoNeb for home use, EpiPen

## 2015-12-16 MED FILL — MELOXICAM 15 MG TABLET: 15 | 30 days supply | Qty: 30 | Fill #0

## 2015-12-16 MED FILL — traMADol HCL 50 MG TABS: 50 | 15 days supply | Qty: 120 | Fill #0

## 2015-12-31 MED FILL — BREO ELLIPTA 100-25 MCG INH: 100-25 | 30 days supply | Qty: 60 | Fill #6

## 2015-12-31 MED FILL — DICLOFENAC SODIUM 1% GEL: 1 | 30 days supply | Qty: 100 | Fill #1

## 2015-12-31 MED FILL — MONTELUKAST SOD 10 MG TAB: 10 | 90 days supply | Qty: 90 | Fill #0

## 2016-01-07 ENCOUNTER — Encounter: Payer: Self-pay | Admitting: Family Medicine

## 2016-01-07 MED FILL — CLINDAMYCIN HCL 300 MG CAPS: 300 | 2 days supply | Qty: 8 | Fill #0

## 2016-02-01 MED FILL — MELOXICAM 15 MG TABLET: 15 | 30 days supply | Qty: 30 | Fill #1

## 2016-02-01 MED FILL — traMADol HCL 50 MG TABS: 50 | 15 days supply | Qty: 120 | Fill #0

## 2016-02-01 MED FILL — BREO ELLIPTA 100-25 MCG INH: 100-25 | 30 days supply | Qty: 60 | Fill #7

## 2016-02-22 ENCOUNTER — Telehealth: Payer: Self-pay | Admitting: Internal Medicine

## 2016-02-22 MED ORDER — AZITHROMYCIN 250 MG PO TABS: ORAL_TABLET | ORAL | 0 refills | Status: DC

## 2016-02-22 NOTE — Telephone Encounter (Signed)
Called spoke with pt. Aware of recs. Rx sent in. Nothing further needed

## 2016-02-22 NOTE — Telephone Encounter (Signed)
Spoke with pt, c/o prod cough with thick yellow mucus occasionally tinged with mucus, HA, sneezing, runny nose, sinus congestion, PND X1-2 days.  Denies fever, chest pain.  Pt requesting something be called in to help with s/s.    Pt has been taking delsym, sudafed, and nebulizers.    Pt uses [facility].    Last ov: 12/03/15 Next ov: 06/13/16  CY please advise on recs.  Thanks!   Allergies  Allergen Reactions  . Ace Inhibitors     Cough/ breathing problems   . Amoxicillin-Pot Clavulanate     REACTION: swelling  . Influenza Virus Vacc Split Pf   . Shellfish Allergy Itching   Current Outpatient Prescriptions on File Prior to Visit  Medication Sig Dispense Refill  . albuterol (PROVENTIL HFA;VENTOLIN HFA) 108 (90 BASE) MCG/ACT inhaler Inhale 2 puffs into the lungs every 6 (six) hours as needed for wheezing or shortness of breath. 18 g 12  . EPINEPHrine 0.3 mg/0.3 mL IJ SOAJ injection Inject 0.3 mLs (0.3 mg total) into the muscle once. 1 Device 12  . fluticasone (FLONASE) 50 MCG/ACT nasal spray Place 2 sprays into both nostrils daily. 48 g 3  . Fluticasone Furoate-Vilanterol (BREO ELLIPTA) 100-25 MCG/INH AEPB INHALE 1 PUFF INTO THE LUNGS DAILY. Rinse mouth 60 each 12  . fluticasone furoate-vilanterol (BREO ELLIPTA) 200-25 MCG/INH AEPB Inhale 1 puff into the lungs daily. 1 each 0  . guaiFENesin (MUCINEX) 600 MG 12 hr tablet Take by mouth 2 (two) times daily as needed.     [name] ipratropium-albuterol (DUONEB) 0.5-2.5 (3) MG/3ML SOLN Take 3 mLs by nebulization every 6 (six) hours as needed. 75 mL 12  . loratadine (CLARITIN) 10 MG tablet Take 10 mg by mouth daily as needed for allergies.     [name] losartan-hydrochlorothiazide (HYZAAR) 50-12.5 MG per tablet TAKE 1 TABLET BY MOUTH ONCE DAILY 90 tablet 3  . meloxicam (MOBIC) 15 MG tablet Take 1 tablet by mouth daily.  1  . montelukast (SINGULAIR) 10 MG tablet Take 1 tablet (10 mg total) by mouth at bedtime. 90 tablet 3  . Multiple Vitamin  (MULTIVITAMIN PO) Take 1 tablet by mouth daily.     . Nebulizers (COMPRESSOR/NEBULIZER) MISC Use as directed 1 each 0  . pseudoephedrine (SUDAFED) 30 MG tablet Take 30 mg by mouth every 4 (four) hours as needed for congestion.     . traMADol (ULTRAM) 50 MG tablet Take 50-100 mg by mouth every 6 (six) hours as needed for moderate pain.      No current facility-administered medications on file prior to visit.

## 2016-02-22 NOTE — Telephone Encounter (Signed)
Suggest Z pak Continue Flonase nasal spray Add an antihistamine like claritin or allegra

## 2016-02-24 ENCOUNTER — Telehealth: Payer: Self-pay | Admitting: Internal Medicine

## 2016-02-24 MED ORDER — PREDNISONE 10 MG PO TABS: ORAL_TABLET | ORAL | 0 refills | Status: DC

## 2016-02-24 MED FILL — predniSONE 10 MG TABS: 10 | 8 days supply | Qty: 20 | Fill #0

## 2016-02-24 NOTE — Telephone Encounter (Signed)
Need to give prednisone 10 mg, # 20, 4 X 2 DAYS, 3 X 2 DAYS, 2 X 2 DAYS, 1 X 2 DAYS

## 2016-02-24 NOTE — Telephone Encounter (Signed)
Spoke with the pt  She states that she started zpack that we called in on 02/22/16 and her cough is better but still SOB  She states that she took neb tx this am with not much relief  Her chest and back feel tight  She is requesting further recs, please advise, thanks! Allergies  Allergen Reactions  . Ace Inhibitors     Cough/ breathing problems   . Amoxicillin-Pot Clavulanate     REACTION: swelling  . Influenza Virus Vacc Split Pf   . Shellfish Allergy Itching   Current Outpatient Prescriptions on File Prior to Visit  Medication Sig Dispense Refill  . albuterol (PROVENTIL HFA;VENTOLIN HFA) 108 (90 BASE) MCG/ACT inhaler Inhale 2 puffs into the lungs every 6 (six) hours as needed for wheezing or shortness of breath. 18 g 12  . azithromycin (ZITHROMAX) 250 MG tablet Take as directed 6 tablet 0  . EPINEPHrine 0.3 mg/0.3 mL IJ SOAJ injection Inject 0.3 mLs (0.3 mg total) into the muscle once. 1 Device 12  . fluticasone (FLONASE) 50 MCG/ACT nasal spray Place 2 sprays into both nostrils daily. 48 g 3  . Fluticasone Furoate-Vilanterol (BREO ELLIPTA) 100-25 MCG/INH AEPB INHALE 1 PUFF INTO THE LUNGS DAILY. Rinse mouth 60 each 12  . fluticasone furoate-vilanterol (BREO ELLIPTA) 200-25 MCG/INH AEPB Inhale 1 puff into the lungs daily. 1 each 0  . guaiFENesin (MUCINEX) 600 MG 12 hr tablet Take by mouth 2 (two) times daily as needed.     [name] ipratropium-albuterol (DUONEB) 0.5-2.5 (3) MG/3ML SOLN Take 3 mLs by nebulization every 6 (six) hours as needed. 75 mL 12  . loratadine (CLARITIN) 10 MG tablet Take 10 mg by mouth daily as needed for allergies.     [name] losartan-hydrochlorothiazide (HYZAAR) 50-12.5 MG per tablet TAKE 1 TABLET BY MOUTH ONCE DAILY 90 tablet 3  . meloxicam (MOBIC) 15 MG tablet Take 1 tablet by mouth daily.  1  . montelukast (SINGULAIR) 10 MG tablet Take 1 tablet (10 mg total) by mouth at bedtime. 90 tablet 3  . Multiple Vitamin (MULTIVITAMIN PO) Take 1 tablet by mouth daily.     .  Nebulizers (COMPRESSOR/NEBULIZER) MISC Use as directed 1 each 0  . pseudoephedrine (SUDAFED) 30 MG tablet Take 30 mg by mouth every 4 (four) hours as needed for congestion.     . traMADol (ULTRAM) 50 MG tablet Take 50-100 mg by mouth every 6 (six) hours as needed for moderate pain.      No current facility-administered medications on file prior to visit.

## 2016-02-24 NOTE — Telephone Encounter (Signed)
Spoke with the pt and notified of recs per CDY  She verbalized understanding  Rx was sent to pharm  

## 2016-03-02 ENCOUNTER — Ambulatory Visit (INDEPENDENT_AMBULATORY_CARE_PROVIDER_SITE_OTHER): Payer: 59 | Admitting: Internal Medicine

## 2016-03-02 ENCOUNTER — Encounter: Payer: Self-pay | Admitting: Internal Medicine

## 2016-03-02 VITALS — BP 130/86 | HR 72 | Temp 98.2°F | Wt 193.0 lb

## 2016-03-02 DIAGNOSIS — R350 Frequency of micturition: Secondary | ICD-10-CM

## 2016-03-02 DIAGNOSIS — R05 Cough: Secondary | ICD-10-CM | POA: Diagnosis not present

## 2016-03-02 DIAGNOSIS — R059 Cough, unspecified: Secondary | ICD-10-CM

## 2016-03-02 LAB — POC URINALSYSI DIPSTICK (AUTOMATED)
BILIRUBIN UA: NEGATIVE
Glucose, UA: NEGATIVE
Ketones, UA: NEGATIVE
Leukocytes, UA: NEGATIVE
NITRITE UA: NEGATIVE
PH UA: 6
PROTEIN UA: NEGATIVE
RBC UA: NEGATIVE
Spec Grav, UA: >=1.03
Urobilinogen, UA: NEGATIVE

## 2016-03-02 MED ORDER — HYDROCODONE-HOMATROPINE 5-1.5 MG/5ML PO SYRP: 5.0000 mL | ORAL_SOLUTION | Freq: Three times a day (TID) | ORAL | 0 refills | Status: DC | PRN

## 2016-03-02 NOTE — Progress Notes (Signed)
Pre visit review using our clinic review tool, if applicable. No additional management support is needed unless otherwise documented below in the visit note. 

## 2016-03-02 NOTE — Addendum Note (Signed)
Addended by: Lurlean Nanny on: 03/02/2016 04:31 PM   Modules accepted: Orders

## 2016-03-02 NOTE — Progress Notes (Signed)
Subjective:    Patient ID: Katherine Tanner, female    DOB: 27-Nov-1960, 55 y.o.   MRN: [id number]  HPI  Pt presents to the clinic today with ongoing cough. This started about 1 week ago. The cough has been productive of yellow mucous. She has associated sinus pressure and ear fullness. She denies fever, chills or body aches. She called her pulmonologist on 8/21 asking for advice. He called her in Azithromycin to take in addition to Mucinex, Sudafed. Flonase, Claritin and Singulair. She called back 8/23 with ongoing symptoms including ongoing cough and shortness of breath, so he sent her in a Prednisone Taper and advise her to use her nebs and take Delsym OTC. She has also been using Albuterol and Duoneb treatments. She reports her symptoms have improved but have not resolved. She has not had sick contacts that she is aware of.   Pt presents to the clinic today with c/o urinary frequency. She reports this started last night. She denies dysuria, fever ,chills or low back pain. She has not tried anything OTC. She is on a diuretic and not sure if this is causing her symptoms.  Review of Systems      Past Medical History:  Diagnosis Date  . Allergy    takes Claritin daily and Flonase daily  . Arthritis   . Asthma    uses Albuterol daily as needed;Singulair nightly;DUlera daily  . GERD (gastroesophageal reflux disease)    occasionally will take OTC meds but states she just watches what she eats  . H/O hiatal hernia   . History of bronchitis    last time 56yrs ago  . History of colon polyps   . History of shingles   . Hypertension    takes Hyzaar daily  . Joint pain   . Joint swelling   . Pneumonia    hx of;last time at least 11yrs ago  . PONV (postoperative nausea and vomiting)     Current Outpatient Prescriptions  Medication Sig Dispense Refill  . albuterol (PROVENTIL HFA;VENTOLIN HFA) 108 (90 BASE) MCG/ACT inhaler Inhale 2 puffs into the lungs every 6 (six) hours as needed  for wheezing or shortness of breath. 18 g 12  . azithromycin (ZITHROMAX) 250 MG tablet Take as directed 6 tablet 0  . EPINEPHrine 0.3 mg/0.3 mL IJ SOAJ injection Inject 0.3 mLs (0.3 mg total) into the muscle once. 1 Device 12  . fluticasone (FLONASE) 50 MCG/ACT nasal spray Place 2 sprays into both nostrils daily. 48 g 3  . Fluticasone Furoate-Vilanterol (BREO ELLIPTA) 100-25 MCG/INH AEPB INHALE 1 PUFF INTO THE LUNGS DAILY. Rinse mouth 60 each 12  . fluticasone furoate-vilanterol (BREO ELLIPTA) 200-25 MCG/INH AEPB Inhale 1 puff into the lungs daily. 1 each 0  . guaiFENesin (MUCINEX) 600 MG 12 hr tablet Take by mouth 2 (two) times daily as needed.     [name] ipratropium-albuterol (DUONEB) 0.5-2.5 (3) MG/3ML SOLN Take 3 mLs by nebulization every 6 (six) hours as needed. 75 mL 12  . loratadine (CLARITIN) 10 MG tablet Take 10 mg by mouth daily as needed for allergies.     [name] losartan-hydrochlorothiazide (HYZAAR) 50-12.5 MG per tablet TAKE 1 TABLET BY MOUTH ONCE DAILY 90 tablet 3  . meloxicam (MOBIC) 15 MG tablet Take 1 tablet by mouth daily.  1  . montelukast (SINGULAIR) 10 MG tablet Take 1 tablet (10 mg total) by mouth at bedtime. 90 tablet 3  . Multiple Vitamin (MULTIVITAMIN PO) Take 1 tablet by mouth daily.     [name]  Nebulizers (COMPRESSOR/NEBULIZER) MISC Use as directed 1 each 0  . predniSONE (DELTASONE) 10 MG tablet 4 x 2 days, 3 x 2 days, 2 x 2 days, 1 x 2 days, then stop 20 tablet 0  . pseudoephedrine (SUDAFED) 30 MG tablet Take 30 mg by mouth every 4 (four) hours as needed for congestion.     . traMADol (ULTRAM) 50 MG tablet Take 50-100 mg by mouth every 6 (six) hours as needed for moderate pain.      No current facility-administered medications for this visit.     Allergies  Allergen Reactions  . Ace Inhibitors     Cough/ breathing problems   . Amoxicillin-Pot Clavulanate     REACTION: swelling  . Influenza Virus Vacc Split Pf   . Shellfish Allergy Itching    Family History  Problem  Relation Age of Onset  . Diabetes Father   . Sarcoidosis Mother   . Glaucoma Mother   . Hypertension Mother     Social History   Social History  . Marital status: Married    Spouse name: N/A  . Number of children: 1  . Years of education: N/A   Occupational History  . Lab assistant     [employer]  .  [employer]   Social History Main Topics  . Smoking status: Never Smoker  . Smokeless tobacco: Not on file  . Alcohol use No  . Drug use: No  . Sexual activity: Yes   Other Topics Concern  . Not on file   Social History Narrative  . No narrative on file     Constitutional: Denies fever, malaise, fatigue, headache or abrupt weight changes.  HEENT: Pt reports ear fullness. Denies eye pain, eye redness, ear pain, ringing in the ears, wax buildup, runny nose, nasal congestion, bloody nose, or sore throat. Respiratory: Pt reports cough. Denies difficulty breathing, shortness of breath, or sputum production.   GU: Pt reports frequency. Denies urgency, dysuria, blood in her urine or vaginal complaints.  No other specific complaints in a complete review of systems (except as listed in HPI above).  Objective:   Physical Exam   BP 130/86   Pulse 72   Temp 98.2 F (36.8 C) (Oral)   Wt 193 lb (87.5 kg)   SpO2 97%   BMI 36.47 kg/m  Wt Readings from Last 3 Encounters:  03/02/16 193 lb (87.5 kg)  12/03/15 192 lb 12.8 oz (87.5 kg)  08/26/15 192 lb (87.1 kg)    General: Appears her stated age, in NAD. HEENT: Head: normal shape and size no sinus tenderness noted,; Eyes: sclera white, no icterus, conjunctiva pink; Ears: Tm's gray and intact, normal light reflex, + serous effusion; Throat/Mouth: Teeth present, mucosa pink and moist, no exudate, lesions or ulcerations noted.  Neck:  No adenopathy noted. Pulmonary/Chest: Normal effort and positive vesicular breath sounds. No respiratory distress. No wheezes, rales or ronchi noted.   BMET    Component Value Date/Time   NA  139 06/05/2015 1420   K 3.8 06/05/2015 1420   CL 100 06/05/2015 1420   CO2 31 06/05/2015 1420   GLUCOSE 86 06/05/2015 1420   BUN 20 06/05/2015 1420   CREATININE 0.64 06/05/2015 1420   CALCIUM 9.8 06/05/2015 1420   GFRNONAA >90 11/16/2013 0610   GFRAA >90 11/16/2013 0610    Lipid Panel     Component Value Date/Time   CHOL 200 06/05/2015 1420   TRIG 105.0 06/05/2015 1420   HDL 57.80 06/05/2015  1420   CHOLHDL 3 06/05/2015 1420   VLDL 21.0 06/05/2015 1420   LDLCALC 122 (H) 06/05/2015 1420    CBC    Component Value Date/Time   WBC 7.4 06/05/2015 1420   RBC 4.51 06/05/2015 1420   HGB 13.0 06/05/2015 1420   HCT 39.3 06/05/2015 1420   PLT 210.0 06/05/2015 1420   MCV 87.3 06/05/2015 1420   MCH 28.2 11/17/2013 0635   MCHC 33.0 06/05/2015 1420   RDW 14.1 06/05/2015 1420   LYMPHSABS 2.7 06/05/2015 1420   MONOABS 0.8 06/05/2015 1420   EOSABS 0.2 06/05/2015 1420   BASOSABS 0.0 06/05/2015 1420    Hgb A1C Lab Results  Component Value Date   HGBA1C 6.1 06/05/2015           Assessment & Plan:   Cough:  Postinfectious vs allergy related Continue Claritin, Singulair and Flonase No indication for repeat abx RX for Hycodan for cough  Urinary Frequency:  Urinalysis: trace blood Push fluids  RTC as needed or if symptoms persist or worsen Teyton Pattillo, NP

## 2016-03-02 NOTE — Patient Instructions (Signed)

## 2016-03-04 ENCOUNTER — Other Ambulatory Visit: Payer: Self-pay | Admitting: Family Medicine

## 2016-03-04 MED FILL — LOSARTAN-HCTZ 50-12.5 MG TA: 50-12.5 | 90 days supply | Qty: 90 | Fill #0

## 2016-03-04 MED FILL — BREO ELLIPTA 100-25 MCG INH: 100-25 | 30 days supply | Qty: 60 | Fill #8

## 2016-03-08 MED FILL — traMADol HCL 50 MG TABS: 50 | 15 days supply | Qty: 120 | Fill #0

## 2016-03-08 MED FILL — MELOXICAM 15 MG TABLET: 15 | 30 days supply | Qty: 30 | Fill #0

## 2016-03-31 DIAGNOSIS — M1712 Unilateral primary osteoarthritis, left knee: Secondary | ICD-10-CM | POA: Diagnosis not present

## 2016-03-31 MED FILL — NABUMETONE 750 MG TABLET: 750 | 30 days supply | Qty: 60 | Fill #0

## 2016-04-03 MED FILL — MONTELUKAST SOD 10 MG TAB: 10 | 90 days supply | Qty: 90 | Fill #1

## 2016-04-03 MED FILL — BREO ELLIPTA 100-25 MCG INH: 100-25 | 30 days supply | Qty: 60 | Fill #9

## 2016-04-11 DIAGNOSIS — N921 Excessive and frequent menstruation with irregular cycle: Secondary | ICD-10-CM | POA: Diagnosis not present

## 2016-04-12 MED FILL — MEDROXYPROGESTERONE 10 MG T: 10 | 10 days supply | Qty: 10 | Fill #0

## 2016-04-18 MED FILL — MELOXICAM 15 MG TABLET: 15 | 30 days supply | Qty: 30 | Fill #1

## 2016-04-19 MED FILL — traMADol HCL 50 MG TABS: 50 | 15 days supply | Qty: 120 | Fill #0

## 2016-05-03 DIAGNOSIS — N920 Excessive and frequent menstruation with regular cycle: Secondary | ICD-10-CM | POA: Diagnosis not present

## 2016-05-03 DIAGNOSIS — D251 Intramural leiomyoma of uterus: Secondary | ICD-10-CM | POA: Diagnosis not present

## 2016-05-03 DIAGNOSIS — N921 Excessive and frequent menstruation with irregular cycle: Secondary | ICD-10-CM | POA: Diagnosis not present

## 2016-05-04 MED FILL — BREO ELLIPTA 100-25 MCG INH: 100-25 | 30 days supply | Qty: 60 | Fill #10

## 2016-05-13 ENCOUNTER — Other Ambulatory Visit (HOSPITAL_COMMUNITY)
Admission: RE | Admit: 2016-05-13 | Discharge: 2016-05-13 | Disposition: A | Discharge status: Home / Self Care | Payer: 59 | Source: Ambulatory Visit | Attending: Obstetrics and Gynecology | Admitting: Obstetrics and Gynecology

## 2016-05-13 ENCOUNTER — Other Ambulatory Visit: Payer: Self-pay | Admitting: Obstetrics and Gynecology

## 2016-05-13 DIAGNOSIS — Z1151 Encounter for screening for human papillomavirus (HPV): Secondary | ICD-10-CM | POA: Diagnosis not present

## 2016-05-13 DIAGNOSIS — Z01419 Encounter for gynecological examination (general) (routine) without abnormal findings: Secondary | ICD-10-CM | POA: Insufficient documentation

## 2016-05-17 DIAGNOSIS — M1712 Unilateral primary osteoarthritis, left knee: Secondary | ICD-10-CM | POA: Diagnosis not present

## 2016-05-17 MED FILL — MELOXICAM 15 MG TABLET: 15 | 30 days supply | Qty: 30 | Fill #0

## 2016-05-17 MED FILL — traMADol HCL 50 MG TABS: 50 | 15 days supply | Qty: 120 | Fill #0

## 2016-05-19 LAB — CYTOLOGY - PAP
Diagnosis: NEGATIVE
HPV: NOT DETECTED

## 2016-05-23 ENCOUNTER — Telehealth: Payer: Self-pay | Admitting: Family Medicine

## 2016-05-23 NOTE — Telephone Encounter (Signed)
[facility]  Patient Name: Katherine Tanner  DOB: [birth year]    Initial Comment caller states her BP is 165/85 and she has a headache   Nurse Assessment  Nurse: Wayne Sever, RN, Tillie Rung Date/Time (Eastern Time): 05/23/2016 9:24:16 AM  Confirm and document reason for call. If symptomatic, describe symptoms. You must click the next button to save text entered. ---Caller states her BP is 165/85 and she has a headache. She does take BP medication and has taken it this morning.  Has the patient traveled out of the country within the last 30 days? ---Not Applicable  Does the patient have any new or worsening symptoms? ---Yes  Will a triage be completed? ---Yes  Related visit to physician within the last 2 weeks? ---No  Does the PT have any chronic conditions? (i.e. diabetes, asthma, etc.) ---Yes  List chronic conditions. ---HTN, Osteoarthritis, Asthma  Is the patient pregnant or possibly pregnant? (Ask all females between the ages of 61-55) ---No  Is this a behavioral health or substance abuse call? ---No     Guidelines    Guideline Title Affirmed Question Affirmed Notes  Headache [1] New headache AND [2] age > 40    Final Disposition User   See Physician within 24 Hours [place], RN, Tillie Rung    Comments  Scheduled patient at 1145am on 11/21 with Dr. Glori Bickers   Referrals  REFERRED TO PCP OFFICE   Disagree/Comply: Comply

## 2016-05-23 NOTE — Telephone Encounter (Signed)
Pt has appt with Dr Glori Bickers on 05/24/16 at 11:45.

## 2016-05-23 NOTE — Telephone Encounter (Signed)
I will see her then  

## 2016-05-24 ENCOUNTER — Encounter: Payer: Self-pay | Admitting: Family Medicine

## 2016-05-24 ENCOUNTER — Ambulatory Visit (INDEPENDENT_AMBULATORY_CARE_PROVIDER_SITE_OTHER): Payer: 59 | Admitting: Family Medicine

## 2016-05-24 VITALS — BP 148/85 | HR 92 | Temp 98.5°F | Ht 61.0 in | Wt 195.5 lb

## 2016-05-24 DIAGNOSIS — E6609 Other obesity due to excess calories: Secondary | ICD-10-CM | POA: Diagnosis not present

## 2016-05-24 DIAGNOSIS — Z6830 Body mass index (BMI) 30.0-30.9, adult: Secondary | ICD-10-CM | POA: Insufficient documentation

## 2016-05-24 DIAGNOSIS — I10 Essential (primary) hypertension: Secondary | ICD-10-CM

## 2016-05-24 DIAGNOSIS — Z6836 Body mass index (BMI) 36.0-36.9, adult: Secondary | ICD-10-CM | POA: Diagnosis not present

## 2016-05-24 DIAGNOSIS — M1712 Unilateral primary osteoarthritis, left knee: Secondary | ICD-10-CM | POA: Diagnosis not present

## 2016-05-24 DIAGNOSIS — Z6834 Body mass index (BMI) 34.0-34.9, adult: Secondary | ICD-10-CM | POA: Insufficient documentation

## 2016-05-24 DIAGNOSIS — E669 Obesity, unspecified: Secondary | ICD-10-CM | POA: Insufficient documentation

## 2016-05-24 DIAGNOSIS — IMO0001 Reserved for inherently not codable concepts without codable children: Secondary | ICD-10-CM

## 2016-05-24 MED ORDER — LOSARTAN POTASSIUM-HCTZ 100-25 MG PO TABS: 1.0000 | ORAL_TABLET | Freq: Every day | ORAL | 3 refills | Status: DC

## 2016-05-24 MED FILL — LOSARTAN-HCTZ 100-25 MG TAB: 100-25 | 90 days supply | Qty: 90 | Fill #0

## 2016-05-24 NOTE — Assessment & Plan Note (Signed)
bp is going up - may be age or menopause related Good health habits Trying to loose wt  Given [employer] eating handout  Inc hyzaar to 100-25 mg and update if any side eff F/u 1 mo for visit with labs  Update if HA does not improve- I suspect it is bp related

## 2016-05-24 NOTE — Patient Instructions (Addendum)
Menopause changes your metabolism and weight changes  Keep exercising! Increase your losartan hct to 100-25 mg daily  This should bring down blood pressure  Take a look at the [facility] handout  Follow up in approx 1 mo for visit and labs

## 2016-05-24 NOTE — Progress Notes (Signed)
Pre visit review using our clinic review tool, if applicable. No additional management support is needed unless otherwise documented below in the visit note. 

## 2016-05-24 NOTE — Progress Notes (Signed)
Subjective:    Patient ID: Katherine Tanner, female    DOB: 09-Oct-1960, 55 y.o.   MRN: [id number]  HPI  Here for HTN and headaches   Pt called [facility] stating her bp was 165/85 and she had a headache  Unsure which caused which  Was higher on sat 190/110 -felt funny also   Wt Readings from Last 3 Encounters:  05/24/16 195 lb 8 oz (88.7 kg)  03/02/16 193 lb (87.5 kg)  12/03/15 192 lb 12.8 oz (87.5 kg)  wt is up 3 lb since June  Eating - was bad until last 2 weeks-now low fat protein and less carbs, drinks lots of water /not a lot of sweets  She measures her portions using a special bowl  Exercise - every day - she uses treadmill and bike and weights -in the am before work  bmi is 36.9  Takes sudafed-not often however/ not recently   bp is higher than usual today  No cp or palpitations or edema  No side effects to medicines  BP Readings from Last 3 Encounters:  05/24/16 (!) 146/84  03/02/16 130/86  12/03/15 124/70     Health habits   A lot going on for the past month  Period would not stop- had to take progesterone for 10 d Had a tooth pulled  Then started supar inj for her knee    Headaches are frontal/ occ on one side or the other  Throbs when it is more severe Tight at other times  No vision problems  Worse with activity  No n/v   Patient Active Problem List   Diagnosis Date Noted  . Obesity 05/24/2016  . Hyperglycemia 02/24/2014  . Leukocytosis, unspecified 02/24/2014  . Postoperative anemia due to acute blood loss 02/24/2014  . Osteoarthritis of right knee 11/15/2013  . Osteoarthritis of left knee 11/15/2013  . Alkaline phosphatase elevation 01/02/2012  . Hyperlipidemia 01/02/2012  . Leukocytosis 01/02/2012  . Routine general medical examination at a health care facility 12/09/2011  . 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 Neck pain 03/08/2011  . Low back pain 03/08/2011  . POLYARTHRITIS 01/28/2010  . ANGIOEDEMA 03/27/2007  . Essential  hypertension 03/19/2007  . Perennial allergic rhinitis with seasonal variation 03/19/2007  . Moderate intermittent asthma 03/19/2007  . GERD 03/19/2007  . HIATAL HERNIA 03/19/2007  . GESTATIONAL DIABETES 03/19/2007   Past Medical History:  Diagnosis Date  . Allergy    takes Claritin daily and Flonase daily  . Arthritis   . Asthma    uses Albuterol daily as needed;Singulair nightly;DUlera daily  . GERD (gastroesophageal reflux disease)    occasionally will take OTC meds but states she just watches what she eats  . H/O hiatal hernia   . History of bronchitis    last time 54yrs ago  . History of colon polyps   . History of shingles   . Hypertension    takes Hyzaar daily  . Joint pain   . Joint swelling   . Pneumonia    hx of;last time at least 42yrs ago  . PONV (postoperative nausea and vomiting)    Past Surgical History:  Procedure Laterality Date  . CARDIAC CATHETERIZATION    . COLONOSCOPY    . ESOPHAGOGASTRODUODENOSCOPY    . KNEE SURGERY Right    arthroscopy  . Right ganglion cyst     x 2  . right lumpectomy  around 1983  . STERIOD INJECTION Left 11/15/2013   Procedure: Marcaine/STEROID INJECTION;  Surgeon: Alta Corning, MD;  Location: [facility];  Service: Orthopedics;  Laterality: Left;  . TOTAL KNEE ARTHROPLASTY Right 11/15/2013   Procedure: RIGHT TOTAL KNEE ARTHROPLASTY;  Surgeon: Alta Corning, MD;  Location: [facility];  Service: Orthopedics;  Laterality: Right;  . TUBAL LIGATION     Social History  Substance Use Topics  . Smoking status: Never Smoker  . Smokeless tobacco: Not on file  . Alcohol use No   Family History  Problem Relation Age of Onset  . Diabetes Father   . Sarcoidosis Mother   . Glaucoma Mother   . Hypertension Mother    Allergies  Allergen Reactions  . Ace Inhibitors     Cough/ breathing problems   . Amoxicillin-Pot Clavulanate     REACTION: swelling  . Influenza Virus Vacc Split Pf   . Shellfish Allergy Itching   Current Outpatient  Prescriptions on File Prior to Visit  Medication Sig Dispense Refill  . albuterol (PROVENTIL HFA;VENTOLIN HFA) 108 (90 BASE) MCG/ACT inhaler Inhale 2 puffs into the lungs every 6 (six) hours as needed for wheezing or shortness of breath. 18 g 12  . EPINEPHrine 0.3 mg/0.3 mL IJ SOAJ injection Inject 0.3 mLs (0.3 mg total) into the muscle once. 1 Device 12  . fluticasone (FLONASE) 50 MCG/ACT nasal spray Place 2 sprays into both nostrils daily. 48 g 3  . Fluticasone Furoate-Vilanterol (BREO ELLIPTA) 100-25 MCG/INH AEPB INHALE 1 PUFF INTO THE LUNGS DAILY. Rinse mouth 60 each 12  . guaiFENesin (MUCINEX) 600 MG 12 hr tablet Take by mouth 2 (two) times daily as needed.     [name] HYDROcodone-homatropine (HYCODAN) 5-1.5 MG/5ML syrup Take 5 mLs by mouth every 8 (eight) hours as needed for cough. 120 mL 0  . ipratropium-albuterol (DUONEB) 0.5-2.5 (3) MG/3ML SOLN Take 3 mLs by nebulization every 6 (six) hours as needed. 75 mL 12  . loratadine (CLARITIN) 10 MG tablet Take 10 mg by mouth daily as needed for allergies.     . meloxicam (MOBIC) 15 MG tablet Take 1 tablet by mouth daily.  1  . montelukast (SINGULAIR) 10 MG tablet Take 1 tablet (10 mg total) by mouth at bedtime. 90 tablet 3  . Multiple Vitamin (MULTIVITAMIN PO) Take 1 tablet by mouth daily.     . Nebulizers (COMPRESSOR/NEBULIZER) MISC Use as directed 1 each 0  . traMADol (ULTRAM) 50 MG tablet Take 50-100 mg by mouth every 6 (six) hours as needed for moderate pain.      No current facility-administered medications on file prior to visit.     Review of Systems    Review of Systems  Constitutional: Negative for fever, appetite change,  and unexpected weight change.  Eyes: Negative for pain and visual disturbance.  Respiratory: Negative for cough and shortness of breath.   Cardiovascular: Negative for cp or palpitations    Gastrointestinal: Negative for nausea, diarrhea and constipation.  Genitourinary: Negative for urgency and frequency.  Skin:  Negative for pallor or rash   Neurological: Negative for weakness, light-headedness, numbness and pos for  headaches.  Hematological: Negative for adenopathy. Does not bruise/bleed easily.  Psychiatric/Behavioral: Negative for dysphoric mood. The patient is not nervous/anxious.  pos for stressors     Objective:   Physical Exam  Constitutional: She appears well-developed and well-nourished. No distress.  obese and well appearing   HENT:  Head: Normocephalic and atraumatic.  Mouth/Throat: Oropharynx is clear and moist.  No sinus or temporal tenderness  Eyes: Conjunctivae and EOM  are normal. Pupils are equal, round, and reactive to light.  Neck: Normal range of motion. Neck supple. No JVD present. Carotid bruit is not present. No thyromegaly present.  Cardiovascular: Normal rate, regular rhythm, normal heart sounds and intact distal pulses.  Exam reveals no gallop.   Pulmonary/Chest: Effort normal and breath sounds normal. No respiratory distress. She has no wheezes. She has no rales.  No crackles  Abdominal: Soft. Bowel sounds are normal. She exhibits no distension, no abdominal bruit and no mass. There is no tenderness.  Musculoskeletal: She exhibits no edema.  Lymphadenopathy:    She has no cervical adenopathy.  Neurological: She is alert. She has normal reflexes. No cranial nerve deficit. She exhibits normal muscle tone. Coordination normal.  Skin: Skin is warm and dry. No rash noted. No pallor.  Psychiatric: She has a normal mood and affect.          Assessment & Plan:   Problem List Items Addressed This Visit      Cardiovascular and Mediastinum   Essential hypertension - Primary    bp is going up - may be age or menopause related Good health habits Trying to loose wt  Given DASH eating handout  Inc hyzaar to 100-25 mg and update if any side eff F/u 1 mo for visit with labs  Update if HA does not improve- I suspect it is bp related       Relevant Medications    losartan-hydrochlorothiazide (HYZAAR) 100-25 MG tablet     Other   Obesity    Discussed how this problem influences overall health and the risks it imposes  Reviewed plan for weight loss with lower calorie diet (via better food choices and also portion control or program like weight watchers) and exercise building up to or more than 30 minutes 5 days per week including some aerobic activity   Pt is making a good effort She may look into the [facility] wt loss program

## 2016-05-24 NOTE — Assessment & Plan Note (Signed)
Discussed how this problem influences overall health and the risks it imposes  Reviewed plan for weight loss with lower calorie diet (via better food choices and also portion control or program like weight watchers) and exercise building up to or more than 30 minutes 5 days per week including some aerobic activity   Pt is making a good effort She may look into the [facility] wt loss program

## 2016-05-31 ENCOUNTER — Encounter: Payer: Self-pay | Admitting: Family Medicine

## 2016-05-31 DIAGNOSIS — M1712 Unilateral primary osteoarthritis, left knee: Secondary | ICD-10-CM | POA: Diagnosis not present

## 2016-06-06 ENCOUNTER — Other Ambulatory Visit: Payer: Self-pay | Admitting: Internal Medicine

## 2016-06-06 MED FILL — BREO ELLIPTA 100-25 MCG INH: 100-25 | 30 days supply | Qty: 60 | Fill #0

## 2016-06-07 DIAGNOSIS — M1712 Unilateral primary osteoarthritis, left knee: Secondary | ICD-10-CM | POA: Diagnosis not present

## 2016-06-09 ENCOUNTER — Telehealth: Payer: Self-pay | Admitting: Family Medicine

## 2016-06-09 DIAGNOSIS — R739 Hyperglycemia, unspecified: Secondary | ICD-10-CM

## 2016-06-09 DIAGNOSIS — E7800 Pure hypercholesterolemia, unspecified: Secondary | ICD-10-CM

## 2016-06-09 DIAGNOSIS — I10 Essential (primary) hypertension: Secondary | ICD-10-CM

## 2016-06-09 NOTE — Telephone Encounter (Signed)
-----   Message from Ellamae Sia sent at 06/07/2016  4:28 PM EST ----- Regarding: Lab orders for Wednesday, 12.13.17 Lab orders for a 1 month f/u

## 2016-06-13 ENCOUNTER — Encounter: Payer: Self-pay | Admitting: Internal Medicine

## 2016-06-13 ENCOUNTER — Ambulatory Visit (INDEPENDENT_AMBULATORY_CARE_PROVIDER_SITE_OTHER): Payer: 59 | Admitting: Internal Medicine

## 2016-06-13 DIAGNOSIS — J302 Other seasonal allergic rhinitis: Secondary | ICD-10-CM | POA: Diagnosis not present

## 2016-06-13 DIAGNOSIS — J3089 Other allergic rhinitis: Secondary | ICD-10-CM | POA: Diagnosis not present

## 2016-06-13 DIAGNOSIS — IMO0001 Reserved for inherently not codable concepts without codable children: Secondary | ICD-10-CM

## 2016-06-13 DIAGNOSIS — J4520 Mild intermittent asthma, uncomplicated: Secondary | ICD-10-CM

## 2016-06-13 MED ORDER — ALBUTEROL SULFATE HFA 108 (90 BASE) MCG/ACT IN AERS: 2.0000 | INHALATION_SPRAY | Freq: Four times a day (QID) | RESPIRATORY_TRACT | 12 refills | Status: DC | PRN

## 2016-06-13 MED ORDER — MONTELUKAST SODIUM 10 MG PO TABS: 10.0000 mg | ORAL_TABLET | Freq: Every day | ORAL | 3 refills | Status: DC

## 2016-06-13 MED FILL — MONTELUKAST SOD 10 MG TAB: 10 | 90 days supply | Qty: 90 | Fill #0

## 2016-06-13 MED FILL — VENTOLIN HFA 90 MCG INHALER: 108 (90 BAS | 25 days supply | Qty: 18 | Fill #0

## 2016-06-13 NOTE — Assessment & Plan Note (Signed)
Uncomplicated with good control and no recent exacerbation. Infrequent need for rescue inhaler with no sleep disturbance. Plan-medications discussed and refilled. We again discussed her history of intolerance to flu vaccine.

## 2016-06-13 NOTE — Patient Instructions (Signed)
Refill scripts sent for singulair and albuterol rescue inhaler  Please call if we can help

## 2016-06-13 NOTE — Assessment & Plan Note (Signed)
No rhinitis symptoms currently. She is comfortable at baseline.

## 2016-06-13 NOTE — Progress Notes (Signed)
Patient ID: Katherine Tanner, female    DOB: May 31, 1961, 55 y.o.   MRN: [id number]  HPI female never smoker followed for Allergic rhinitis, Asthma, complicated by GERD Office spirometry: 11/24/2014: WNL FVC 2.39/97%, FEV1 2.09/103%, FEV1/FVC 0.88, FEF 25-75 percent 3.27/122%   08/26/2015-55 year old female never smoker followed here for Allergic rhinitis, Asthma, complicated by GERD ACUTE VISIT: Pt states she is coughing-productive-white in color, upper back and chest pain-started yesterday. SOB as well. Over past 2 days his increasing use of rescue inhaler. Cough productive white sputum. Tussive soreness through sternum to back. Denies fever, leg pain or swelling, palpitation, myalgias or chills  12/03/2015-55 year old female never smoker followed for Allergic rhinitis, Asthma, complicated by GERD FOLLOWS FOR: Pt states she has had increase in wheezing past few weeks along with SOB at times.  Increased nasal congestion and drainage. Blames "weather". She would like her own nebulizer machine and an EpiPen.  06/13/2016-55 year old female never smoker followed for Allergic rhinitis, Asthma, complicated by GERD Follows for asthma 6 month follow up. Again declines flu vaccine as "my body doesn't like it" No major exacerbations since last here. Bilateral rib cage feels "sore" when out in cold weather but without significant cough or wheeze. Rare use of nebulizer machine. Needs refill rescue inhaler and Singulair. Denies sleep disturbance. Ears feel "wet"  Review of Systems-Per HPI.   + = pos Constitutional:   No-   weight loss, night sweats, fevers, chills, fatigue, lassitude. HEENT:   +  headaches, difficulty swallowing, tooth/dental problems, sore throat,       No-sneezing, itching, no-ear ache, + nasal congestion, no- post nasal drip,  CV:  + chest pain, orthopnea, PND, swelling in lower extremities, anasarca, dizziness, palpitations Resp: No-   shortness of breath with exertion or  at rest.             productive cough,  No- non-productive cough,  No- coughing up of blood.              No-   change in color of mucus. No- wheezing.   Skin: + rash GI:  No-   heartburn, indigestion, abdominal pain, nausea, vomiting,  GU: . MS:  No-   joint pain or swelling.  . Neuro-     nothing unusual Psych:  No- change in mood or affect. No depression or anxiety.  No memory loss.  Objective:   Physical Exam General- Alert, Oriented, Affect-appropriate, Distress- none acute; + Obese Skin- rash-none, lesions- none, excoriation- none Lymphadenopathy- none Head- atraumatic            Eyes- Gross vision intact, PERRLA, conjunctivae clear, no discolored secretions            Ears- Hearing, canals-normal            Nose-+ Sniffing, no-Septal dev, mucus, polyps, erosion, perforation             Throat- Mallampati III , mucosa clear , drainage- none, tonsils- atrophic Neck- flexible , trachea midline, no stridor , thyroid nl, carotid no bruit Chest - symmetrical excursion , unlabored           Heart/CV- RRR , no murmur , no gallop  , no rub, nl s1 s2                           - JVD- none , edema- none, stasis changes- none, varices- none  Lung- clear to P&A, wheeze- none, cough -none , dullness-none, rub- none           Chest wall-  Abd- tender-no,  Br/ Gen/ Rectal- Not done, not indicated Extrem- cyanosis- none, clubbing, none, atrophy- none, strength- nl Neuro- grossly intact to observation

## 2016-06-14 DIAGNOSIS — M1712 Unilateral primary osteoarthritis, left knee: Secondary | ICD-10-CM | POA: Diagnosis not present

## 2016-06-15 ENCOUNTER — Other Ambulatory Visit (INDEPENDENT_AMBULATORY_CARE_PROVIDER_SITE_OTHER): Payer: 59

## 2016-06-15 DIAGNOSIS — I10 Essential (primary) hypertension: Secondary | ICD-10-CM | POA: Diagnosis not present

## 2016-06-15 DIAGNOSIS — E7800 Pure hypercholesterolemia, unspecified: Secondary | ICD-10-CM | POA: Diagnosis not present

## 2016-06-15 DIAGNOSIS — R739 Hyperglycemia, unspecified: Secondary | ICD-10-CM | POA: Diagnosis not present

## 2016-06-15 LAB — COMPREHENSIVE METABOLIC PANEL
ALBUMIN: 4.4 g/dL (ref 3.5–5.2)
ALK PHOS: 129 U/L — AB (ref 39–117)
ALT: 37 U/L — AB (ref 0–35)
AST: 30 U/L (ref 0–37)
BILIRUBIN TOTAL: 0.4 mg/dL (ref 0.2–1.2)
BUN: 21 mg/dL (ref 6–23)
CO2: 33 mEq/L — ABNORMAL HIGH (ref 19–32)
CREATININE: 0.68 mg/dL (ref 0.40–1.20)
Calcium: 9.7 mg/dL (ref 8.4–10.5)
Chloride: 101 mEq/L (ref 96–112)
GFR: 116.05 mL/min (ref >60.00)
GLUCOSE: 82 mg/dL (ref 70–99)
POTASSIUM: 3.8 meq/L (ref 3.5–5.1)
SODIUM: 141 meq/L (ref 135–145)
TOTAL PROTEIN: 7.4 g/dL (ref 6.0–8.3)

## 2016-06-15 LAB — CBC WITH DIFFERENTIAL/PLATELET
BASOS ABS: 0 10*3/uL (ref 0.0–0.1)
Basophils Relative: 0.6 % (ref 0.0–3.0)
EOS PCT: 2.2 % (ref 0.0–5.0)
Eosinophils Absolute: 0.1 10*3/uL (ref 0.0–0.7)
HCT: 38.3 % (ref 36.0–46.0)
HEMOGLOBIN: 12.7 g/dL (ref 12.0–15.0)
LYMPHS ABS: 2.5 10*3/uL (ref 0.7–4.0)
Lymphocytes Relative: 37.9 % (ref 12.0–46.0)
MCHC: 33.2 g/dL (ref 30.0–36.0)
MCV: 85.9 fl (ref 78.0–100.0)
MONO ABS: 0.8 10*3/uL (ref 0.1–1.0)
Monocytes Relative: 12 % (ref 3.0–12.0)
NEUTROS PCT: 47.3 % (ref 43.0–77.0)
Neutro Abs: 3.2 10*3/uL (ref 1.4–7.7)
Platelets: 178 10*3/uL (ref 150.0–400.0)
RBC: 4.46 Mil/uL (ref 3.87–5.11)
RDW: 14 % (ref 11.5–15.5)
WBC: 6.7 10*3/uL (ref 4.0–10.5)

## 2016-06-15 LAB — LIPID PANEL
CHOL/HDL RATIO: 3
Cholesterol: 191 mg/dL (ref 0–200)
HDL: 67.2 mg/dL (ref >39.00)
LDL Cholesterol: 112 mg/dL — ABNORMAL HIGH (ref 0–99)
NONHDL: 123.89
Triglycerides: 61 mg/dL (ref 0.0–149.0)
VLDL: 12.2 mg/dL (ref 0.0–40.0)

## 2016-06-15 LAB — TSH: TSH: 0.93 u[IU]/mL (ref 0.35–4.50)

## 2016-06-15 LAB — HEMOGLOBIN A1C: HEMOGLOBIN A1C: 6.1 % (ref 4.6–6.5)

## 2016-06-22 ENCOUNTER — Encounter: Payer: Self-pay | Admitting: Family Medicine

## 2016-06-22 ENCOUNTER — Ambulatory Visit (INDEPENDENT_AMBULATORY_CARE_PROVIDER_SITE_OTHER): Payer: 59 | Admitting: Family Medicine

## 2016-06-22 VITALS — BP 132/68 | HR 83 | Temp 97.5°F | Ht 61.0 in | Wt 194.5 lb

## 2016-06-22 DIAGNOSIS — R739 Hyperglycemia, unspecified: Secondary | ICD-10-CM

## 2016-06-22 DIAGNOSIS — E7800 Pure hypercholesterolemia, unspecified: Secondary | ICD-10-CM | POA: Diagnosis not present

## 2016-06-22 DIAGNOSIS — E6609 Other obesity due to excess calories: Secondary | ICD-10-CM

## 2016-06-22 DIAGNOSIS — I10 Essential (primary) hypertension: Secondary | ICD-10-CM | POA: Diagnosis not present

## 2016-06-22 DIAGNOSIS — Z6836 Body mass index (BMI) 36.0-36.9, adult: Secondary | ICD-10-CM

## 2016-06-22 DIAGNOSIS — IMO0001 Reserved for inherently not codable concepts without codable children: Secondary | ICD-10-CM

## 2016-06-22 NOTE — Assessment & Plan Note (Signed)
Discussed how this problem influences overall health and the risks it imposes  Reviewed plan for weight loss with lower calorie diet (via better food choices and also portion control or program like weight watchers) and exercise building up to or more than 30 minutes 5 days per week including some aerobic activity   Commended on work so far Will also continue walking and going to the gym

## 2016-06-22 NOTE — Progress Notes (Signed)
Subjective:    Patient ID: Katherine Tanner, female    DOB: July 03, 1961, 55 y.o.   MRN: [id number]  HPI  Here for f/u of chronic medical problems  Feeling ok   Wt Readings from Last 3 Encounters:  06/22/16 194 lb 8 oz (88.2 kg)  06/13/16 195 lb 9.6 oz (88.7 kg)  03/02/16 193 lb (87.5 kg)  bmi is 36.7 Given DASH handout and watching diet  Eating chicken and fish and sodium  Still going to the gym and walking   bp is stable today  No cp or palpitations or headaches or edema  No side effects to medicines  BP Readings from Last 3 Encounters:  06/22/16 132/68  06/13/16 112/70  03/02/16 130/86    Last visit inc her medication to hyzaar 100-25 Tolerates medicine w/o a problem     Results for orders placed or performed in visit on 06/15/16  CBC with Differential/Platelet  Result Value Ref Range   WBC 6.7 4.0 - 10.5 K/uL   RBC 4.46 3.87 - 5.11 Mil/uL   Hemoglobin 12.7 12.0 - 15.0 g/dL   HCT 38.3 36.0 - 46.0 %   MCV 85.9 78.0 - 100.0 fl   MCHC 33.2 30.0 - 36.0 g/dL   RDW 14.0 11.5 - 15.5 %   Platelets 178.0 150.0 - 400.0 K/uL   Neutrophils Relative % 47.3 43.0 - 77.0 %   Lymphocytes Relative 37.9 12.0 - 46.0 %   Monocytes Relative 12.0 3.0 - 12.0 %   Eosinophils Relative 2.2 0.0 - 5.0 %   Basophils Relative 0.6 0.0 - 3.0 %   Neutro Abs 3.2 1.4 - 7.7 K/uL   Lymphs Abs 2.5 0.7 - 4.0 K/uL   Monocytes Absolute 0.8 0.1 - 1.0 K/uL   Eosinophils Absolute 0.1 0.0 - 0.7 K/uL   Basophils Absolute 0.0 0.0 - 0.1 K/uL  Comprehensive metabolic panel  Result Value Ref Range   Sodium 141 135 - 145 mEq/L   Potassium 3.8 3.5 - 5.1 mEq/L   Chloride 101 96 - 112 mEq/L   CO2 33 (H) 19 - 32 mEq/L   Glucose, Bld 82 70 - 99 mg/dL   BUN 21 6 - 23 mg/dL   Creatinine, Ser 0.68 0.40 - 1.20 mg/dL   Total Bilirubin 0.4 0.2 - 1.2 mg/dL   Alkaline Phosphatase 129 (H) 39 - 117 U/L   AST 30 0 - 37 U/L   ALT 37 (H) 0 - 35 U/L   Total Protein 7.4 6.0 - 8.3 g/dL   Albumin 4.4 3.5 - 5.2 g/dL    Calcium 9.7 8.4 - 10.5 mg/dL   GFR 116.05 >60.00 mL/min  Hemoglobin A1c  Result Value Ref Range   Hgb A1c MFr Bld 6.1 4.6 - 6.5 %  Lipid panel  Result Value Ref Range   Cholesterol 191 0 - 200 mg/dL   Triglycerides 61.0 0.0 - 149.0 mg/dL   HDL 67.20 >39.00 mg/dL   VLDL 12.2 0.0 - 40.0 mg/dL   LDL Cholesterol 112 (H) 0 - 99 mg/dL   Total CHOL/HDL Ratio 3    NonHDL 123.89   TSH  Result Value Ref Range   TSH 0.93 0.35 - 4.50 uIU/mL     Hyperglycemia -  Lab Results  Component Value Date   HGBA1C 6.1 06/15/2016   This is stable from last year  Also watching her sugar intake  Working on weight loss   LDL is down from 122 to 112 Eating less fatty meats  and fried foods   Baseline elevated alk phos is down  No acetaminophen or etoh    Patient Active Problem List   Diagnosis Date Noted  . Obesity 05/24/2016  . Hyperglycemia 02/24/2014  . Osteoarthritis of right knee 11/15/2013  . Osteoarthritis of left knee 11/15/2013  . Alkaline phosphatase elevation 01/02/2012  . Hyperlipidemia 01/02/2012  . Routine general medical examination at a health care facility 12/09/2011  . 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 POLYARTHRITIS 01/28/2010  . ANGIOEDEMA 03/27/2007  . Essential hypertension 03/19/2007  . Perennial allergic rhinitis with seasonal variation 03/19/2007  . Moderate intermittent asthma 03/19/2007  . GERD 03/19/2007  . HIATAL HERNIA 03/19/2007  . GESTATIONAL DIABETES 03/19/2007   Past Medical History:  Diagnosis Date  . Allergy    takes Claritin daily and Flonase daily  . Arthritis   . Asthma    uses Albuterol daily as needed;Singulair nightly;DUlera daily  . GERD (gastroesophageal reflux disease)    occasionally will take OTC meds but states she just watches what she eats  . H/O hiatal hernia   . History of bronchitis    last time 73yr ago  . History of colon polyps   . History of shingles   . Hypertension    takes Hyzaar daily  . Joint pain   .  Joint swelling   . Pneumonia    hx of;last time at least 89yrago  . PONV (postoperative nausea and vomiting)    Past Surgical History:  Procedure Laterality Date  . CARDIAC CATHETERIZATION    . COLONOSCOPY    . ESOPHAGOGASTRODUODENOSCOPY    . KNEE SURGERY Right    arthroscopy  . Right ganglion cyst     x 2  . right lumpectomy  around 1983  . STERIOD INJECTION Left 11/15/2013   Procedure: Marcaine/STEROID INJECTION;  Surgeon: JoAlta CorningMD;  [facility] [facility] Service: Orthopedics;  Laterality: Left;  . TOTAL KNEE ARTHROPLASTY Right 11/15/2013   Procedure: RIGHT TOTAL KNEE ARTHROPLASTY;  Surgeon: JoAlta CorningMD;  [facility] Service: Orthopedics;  Laterality: Right;  . TUBAL LIGATION     Social History  Substance Use Topics  . Smoking status: Never Smoker  . Smokeless tobacco: Not on file  . Alcohol use No   Family History  Problem Relation Age of Onset  . Diabetes Father   . Sarcoidosis Mother   . Glaucoma Mother   . Hypertension Mother    Allergies  Allergen Reactions  . Ace Inhibitors     Cough/ breathing problems   . Amoxicillin-Pot Clavulanate     REACTION: swelling  . Influenza Virus Vacc Split Pf   . Shellfish Allergy Itching   Current Outpatient Prescriptions on File Prior to Visit  Medication Sig Dispense Refill  . albuterol (PROVENTIL HFA;VENTOLIN HFA) 108 (90 Base) MCG/ACT inhaler Inhale 2 puffs into the lungs every 6 (six) hours as needed for wheezing or shortness of breath. 18 g 12  . BREO ELLIPTA 100-25 MCG/INH AEPB INHALE 1 PUFF INTO THE LUNGS DAILY. RINSE MOUTH. 60 each 12  . EPINEPHrine 0.3 mg/0.3 mL IJ SOAJ injection Inject 0.3 mLs (0.3 mg total) into the muscle once. 1 Device 12  . fluticasone (FLONASE) 50 MCG/ACT nasal spray Place 2 sprays into both nostrils daily. 48 g 3  . guaiFENesin (MUCINEX) 600 MG 12 hr tablet Take by mouth 2 (two) times daily as needed.     . Marland Kitchenpratropium-albuterol (DUONEB) 0.5-2.5 (3) MG/3ML SOLN Take  3 mLs by  nebulization every 6 (six) hours as needed. 75 mL 12  . loratadine (CLARITIN) 10 MG tablet Take 10 mg by mouth daily as needed for allergies.     [name] losartan-hydrochlorothiazide (HYZAAR) 100-25 MG tablet Take 1 tablet by mouth daily. 90 tablet 3  . meloxicam (MOBIC) 15 MG tablet Take 1 tablet by mouth daily.  1  . montelukast (SINGULAIR) 10 MG tablet Take 1 tablet (10 mg total) by mouth at bedtime. 90 tablet 3  . Multiple Vitamin (MULTIVITAMIN PO) Take 1 tablet by mouth daily.     . Nebulizers (COMPRESSOR/NEBULIZER) MISC Use as directed 1 each 0  . traMADol (ULTRAM) 50 MG tablet Take 50-100 mg by mouth every 6 (six) hours as needed for moderate pain.      No current facility-administered medications on file prior to visit.     Review of Systems Review of Systems  Constitutional: Negative for fever, appetite change, fatigue and unexpected weight change.  Eyes: Negative for pain and visual disturbance.  Respiratory: Negative for cough and shortness of breath.   Cardiovascular: Negative for cp or palpitations    Gastrointestinal: Negative for nausea, diarrhea and constipation.  Genitourinary: Negative for urgency and frequency.  Skin: Negative for pallor or rash   Neurological: Negative for weakness, light-headedness, numbness and headaches.  Hematological: Negative for adenopathy. Does not bruise/bleed easily.  Psychiatric/Behavioral: Negative for dysphoric mood. The patient is not nervous/anxious.         Objective:   Physical Exam  Constitutional: She appears well-developed and well-nourished. No distress.  obese and well appearing   HENT:  Head: Normocephalic and atraumatic.  Mouth/Throat: Oropharynx is clear and moist.  Eyes: Conjunctivae and EOM are normal. Pupils are equal, round, and reactive to light.  Neck: Normal range of motion. Neck supple. No JVD present. Carotid bruit is not present. No thyromegaly present.  Cardiovascular: Normal rate, regular rhythm, normal heart  sounds and intact distal pulses.  Exam reveals no gallop.   Pulmonary/Chest: Effort normal and breath sounds normal. No respiratory distress. She has no wheezes. She has no rales.  No crackles  No wheeze Good air exch   Abdominal: Soft. Bowel sounds are normal. She exhibits no distension, no abdominal bruit and no mass. There is no tenderness.  Musculoskeletal: She exhibits no edema.  Lymphadenopathy:    She has no cervical adenopathy.  Neurological: She is alert. She has normal reflexes. No cranial nerve deficit. She exhibits normal muscle tone. Coordination normal.  Skin: Skin is warm and dry. No rash noted.  Psychiatric: She has a normal mood and affect.          Assessment & Plan:   Problem List Items Addressed This Visit      Cardiovascular and Mediastinum   Essential hypertension - Primary    bp in fair control at this time  BP Readings from Last 1 Encounters:  06/22/16 132/68   No changes needed Disc lifstyle change with low sodium diet and exercise  Labs reviewed  Enc to keep working on weight loss         Other   Obesity    Discussed how this problem influences overall health and the risks it imposes  Reviewed plan for weight loss with lower calorie diet (via better food choices and also portion control or program like weight watchers) and exercise building up to or more than 30 minutes 5 days per week including some aerobic activity   Commended on work  so far Will also continue walking and going to the gym       Hyperlipidemia    Disc goals for lipids and reasons to control them Rev labs with pt Rev low sat fat diet in detail HDL is good LDL down from a year ago  Enc to keep working on it       Hyperglycemia    Stable from last time Lab Results  Component Value Date   HGBA1C 6.1 06/15/2016   Enc to keep working on low glycemic diet and exercise and weight loss to prevent DM2

## 2016-06-22 NOTE — Progress Notes (Signed)
Pre visit review using our clinic review tool, if applicable. No additional management support is needed unless otherwise documented below in the visit note. 

## 2016-06-22 NOTE — Patient Instructions (Signed)
Blood pressure is better  Labs are stable  Keep working on healthy diet and exercise

## 2016-06-22 NOTE — Assessment & Plan Note (Signed)
bp in fair control at this time  BP Readings from Last 1 Encounters:  06/22/16 132/68   No changes needed Disc lifstyle change with low sodium diet and exercise  Labs reviewed  Enc to keep working on weight loss

## 2016-06-22 NOTE — Assessment & Plan Note (Signed)
Stable from last time Lab Results  Component Value Date   HGBA1C 6.1 06/15/2016   Enc to keep working on low glycemic diet and exercise and weight loss to prevent DM2

## 2016-06-22 NOTE — Assessment & Plan Note (Signed)
Disc goals for lipids and reasons to control them Rev labs with pt Rev low sat fat diet in detail HDL is good LDL down from a year ago  Enc to keep working on it

## 2016-07-05 MED FILL — MELOXICAM 15 MG TABLET: 15 | 30 days supply | Qty: 30 | Fill #1

## 2016-07-05 MED FILL — BREO ELLIPTA 100-25 MCG INH: 100-25 | 30 days supply | Qty: 60 | Fill #1

## 2016-07-06 DIAGNOSIS — H5203 Hypermetropia, bilateral: Secondary | ICD-10-CM | POA: Diagnosis not present

## 2016-07-06 DIAGNOSIS — H524 Presbyopia: Secondary | ICD-10-CM | POA: Diagnosis not present

## 2016-07-06 DIAGNOSIS — H52222 Regular astigmatism, left eye: Secondary | ICD-10-CM | POA: Diagnosis not present

## 2016-07-06 DIAGNOSIS — H04123 Dry eye syndrome of bilateral lacrimal glands: Secondary | ICD-10-CM | POA: Diagnosis not present

## 2016-07-06 DIAGNOSIS — H16223 Keratoconjunctivitis sicca, not specified as Sjogren's, bilateral: Secondary | ICD-10-CM | POA: Diagnosis not present

## 2016-07-06 MED FILL — traMADol HCL 50 MG TABS: 50 | 20 days supply | Qty: 60 | Fill #0

## 2016-07-13 MED FILL — CLINDAMYCIN HCL 300 MG CAP: 300 | 4 days supply | Qty: 8 | Fill #0

## 2016-08-02 DIAGNOSIS — M25562 Pain in left knee: Secondary | ICD-10-CM | POA: Diagnosis not present

## 2016-08-02 MED FILL — traMADol HCL 50 MG TABS: 50 | 15 days supply | Qty: 120 | Fill #0

## 2016-08-02 MED FILL — MELOXICAM 15 MG TABLET: 15 | 30 days supply | Qty: 30 | Fill #0

## 2016-08-03 MED FILL — BREO ELLIPTA 100-25 MCG INH: 100-25 | 30 days supply | Qty: 60 | Fill #2

## 2016-09-01 MED FILL — LOSARTAN-HCTZ 100-25 MG TAB: 100-25 | 90 days supply | Qty: 90 | Fill #1

## 2016-09-01 MED FILL — BREO ELLIPTA 100-25 MCG INH: 100-25 | 30 days supply | Qty: 60 | Fill #3

## 2016-09-07 MED FILL — MELOXICAM 15 MG TABLET: 15 | 30 days supply | Qty: 30 | Fill #1

## 2016-09-08 MED FILL — traMADol HCL 50 MG TABS: 50 | 15 days supply | Qty: 120 | Fill #0

## 2016-09-13 DIAGNOSIS — M1712 Unilateral primary osteoarthritis, left knee: Secondary | ICD-10-CM | POA: Diagnosis not present

## 2016-09-13 MED FILL — HYDROCODON-APAP 5-325: 5-325 | 10 days supply | Qty: 30 | Fill #0

## 2016-10-03 MED FILL — BREO ELLIPTA 100-25 MCG INH: 100-25 | 30 days supply | Qty: 60 | Fill #4

## 2016-10-04 MED FILL — MELOXICAM 15 MG TABLET: 15 | 30 days supply | Qty: 30 | Fill #0

## 2016-10-04 MED FILL — MONTELUKAST SOD 10 MG TAB: 10 | 90 days supply | Qty: 90 | Fill #1

## 2016-10-18 ENCOUNTER — Other Ambulatory Visit: Payer: Self-pay | Admitting: Orthopedic Surgery

## 2016-10-21 MED FILL — traMADol HCL 50 MG TABS: 50 | 15 days supply | Qty: 120 | Fill #0

## 2016-10-21 MED FILL — NABUMETONE 750 MG TABLET: 750 | 30 days supply | Qty: 60 | Fill #1

## 2016-10-26 ENCOUNTER — Other Ambulatory Visit: Payer: Self-pay | Admitting: Obstetrics and Gynecology

## 2016-10-26 DIAGNOSIS — Z1231 Encounter for screening mammogram for malignant neoplasm of breast: Secondary | ICD-10-CM

## 2016-10-27 MED FILL — BREO ELLIPTA 100-25 MCG INH: 100-25 | 30 days supply | Qty: 60 | Fill #5

## 2016-10-27 MED FILL — HYDROCODON-APAP 5-325: 5-325 | 15 days supply | Qty: 30 | Fill #0

## 2016-11-10 ENCOUNTER — Ambulatory Visit (INDEPENDENT_AMBULATORY_CARE_PROVIDER_SITE_OTHER): Payer: 59 | Admitting: Internal Medicine

## 2016-11-10 ENCOUNTER — Encounter: Payer: Self-pay | Admitting: Internal Medicine

## 2016-11-10 VITALS — BP 126/78 | HR 76 | Temp 98.7°F | Wt 197.0 lb

## 2016-11-10 DIAGNOSIS — M545 Low back pain, unspecified: Secondary | ICD-10-CM

## 2016-11-10 MED ORDER — CYCLOBENZAPRINE HCL 10 MG PO TABS: 5.0000 mg | ORAL_TABLET | Freq: Every evening | ORAL | 0 refills | Status: DC | PRN

## 2016-11-10 NOTE — Progress Notes (Signed)
Subjective:    Patient ID: Katherine Tanner, female    DOB: September 29, 1960, 56 y.o.   MRN: [id number]  HPI Here for back pain  Doesn't know why it started Wonders if she is compensating due to knee pain---scheduled for TKR next month Yesterday after trying to get up from sofa-- couldn't get up (evening) Stands at work--- lab tech  Lower left pain No radiation up or to legs Ice and lying down--seemed to help No specific exacerbating features Able to work today but left early No leg weakness other than from the bad knee Trouble even turning in bed last night Tried heat this AM  Uses tramadol and hydrocodone--- no other meds mobic chronically  Current Outpatient Prescriptions on File Prior to Visit  Medication Sig Dispense Refill  . albuterol (PROVENTIL HFA;VENTOLIN HFA) 108 (90 Base) MCG/ACT inhaler Inhale 2 puffs into the lungs every 6 (six) hours as needed for wheezing or shortness of breath. 18 g 12  . BREO ELLIPTA 100-25 MCG/INH AEPB INHALE 1 PUFF INTO THE LUNGS DAILY. RINSE MOUTH. 60 each 12  . EPINEPHrine 0.3 mg/0.3 mL IJ SOAJ injection Inject 0.3 mLs (0.3 mg total) into the muscle once. 1 Device 12  . fluticasone (FLONASE) 50 MCG/ACT nasal spray Place 2 sprays into both nostrils daily. 48 g 3  . guaiFENesin (MUCINEX) 600 MG 12 hr tablet Take by mouth 2 (two) times daily as needed.     [name] ipratropium-albuterol (DUONEB) 0.5-2.5 (3) MG/3ML SOLN Take 3 mLs by nebulization every 6 (six) hours as needed. 75 mL 12  . loratadine (CLARITIN) 10 MG tablet Take 10 mg by mouth daily as needed for allergies.     [name] losartan-hydrochlorothiazide (HYZAAR) 100-25 MG tablet Take 1 tablet by mouth daily. 90 tablet 3  . meloxicam (MOBIC) 15 MG tablet Take 1 tablet by mouth daily.  1  . montelukast (SINGULAIR) 10 MG tablet Take 1 tablet (10 mg total) by mouth at bedtime. 90 tablet 3  . Multiple Vitamin (MULTIVITAMIN PO) Take 1 tablet by mouth daily.     . Nebulizers (COMPRESSOR/NEBULIZER) MISC  Use as directed 1 each 0  . traMADol (ULTRAM) 50 MG tablet Take 50-100 mg by mouth every 6 (six) hours as needed for moderate pain.      No current facility-administered medications on file prior to visit.     Allergies  Allergen Reactions  . Ace Inhibitors     Cough/ breathing problems   . Amoxicillin-Pot Clavulanate     REACTION: swelling  . Influenza Virus Vacc Split Pf   . Shellfish Allergy Itching    Past Medical History:  Diagnosis Date  . Allergy    takes Claritin daily and Flonase daily  . Arthritis   . Asthma    uses Albuterol daily as needed;Singulair nightly;DUlera daily  . GERD (gastroesophageal reflux disease)    occasionally will take OTC meds but states she just watches what she eats  . H/O hiatal hernia   . History of bronchitis    last time 9yrs ago  . History of colon polyps   . History of shingles   . Hypertension    takes Hyzaar daily  . Joint pain   . Joint swelling   . Pneumonia    hx of;last time at least 65yrs ago  . PONV (postoperative nausea and vomiting)     Past Surgical History:  Procedure Laterality Date  . CARDIAC CATHETERIZATION    . COLONOSCOPY    . ESOPHAGOGASTRODUODENOSCOPY    .  KNEE SURGERY Right    arthroscopy  . Right ganglion cyst     x 2  . right lumpectomy  around 1983  . STERIOD INJECTION Left 11/15/2013   Procedure: Marcaine/STEROID INJECTION;  Surgeon: Alta Corning, MD;  Location: [facility];  Service: Orthopedics;  Laterality: Left;  . TOTAL KNEE ARTHROPLASTY Right 11/15/2013   Procedure: RIGHT TOTAL KNEE ARTHROPLASTY;  Surgeon: Alta Corning, MD;  Location: [facility];  Service: Orthopedics;  Laterality: Right;  . TUBAL LIGATION      Family History  Problem Relation Age of Onset  . Diabetes Father   . Sarcoidosis Mother   . Glaucoma Mother   . Hypertension Mother     Social History   Social History  . Marital status: Married    Spouse name: N/A  . Number of children: 1  . Years of education: N/A   Occupational  History  . Lab assistant     [employer]  .  [employer]   Social History Main Topics  . Smoking status: Never Smoker  . Smokeless tobacco: Not on file  . Alcohol use No  . Drug use: No  . Sexual activity: Yes   Other Topics Concern  . Not on file   Social History Narrative  . No narrative on file   Review of Systems No fever No dysuria or hematuria    Objective:   Physical Exam  Constitutional: No distress.  Musculoskeletal:  No spine tenderness Tenderness lateral left lumbar area Normal ROM of left hip SLR negative   Neurological:  Gait is slow but normal Normal strength in legs           Assessment & Plan:

## 2016-11-10 NOTE — Assessment & Plan Note (Signed)
Discussed that this seems to be a muscle strain Discussed ice after work or with pain--heat otherwise Cyclobenzaprine for night prn Should be self limited

## 2016-11-15 ENCOUNTER — Ambulatory Visit
Admission: RE | Admit: 2016-11-15 | Discharge: 2016-11-15 | Disposition: A | Discharge status: Home / Self Care | Payer: 59 | Source: Ambulatory Visit | Attending: Obstetrics and Gynecology | Admitting: Obstetrics and Gynecology

## 2016-11-15 DIAGNOSIS — Z1231 Encounter for screening mammogram for malignant neoplasm of breast: Secondary | ICD-10-CM

## 2016-11-24 DIAGNOSIS — M1712 Unilateral primary osteoarthritis, left knee: Secondary | ICD-10-CM | POA: Diagnosis not present

## 2016-11-24 MED FILL — LOSARTAN-HCTZ 100-25 MG TAB: 100-25 | 90 days supply | Qty: 90 | Fill #2

## 2016-11-24 MED FILL — BREO ELLIPTA 100-25 MCG INH: 100-25 | 30 days supply | Qty: 60 | Fill #6

## 2016-11-24 MED FILL — HYDROCODON-APAP 5-325: 5-325 | 15 days supply | Qty: 30 | Fill #0

## 2016-11-29 MED FILL — MELOXICAM 15 MG TABLET: 15 | 30 days supply | Qty: 30 | Fill #1

## 2016-12-14 MED FILL — traMADol HCL 50 MG TABS: 50 | 15 days supply | Qty: 120 | Fill #0

## 2016-12-19 ENCOUNTER — Encounter (HOSPITAL_COMMUNITY)
Admission: RE | Admit: 2016-12-19 | Discharge: 2016-12-19 | Disposition: A | Discharge status: Home / Self Care | Payer: 59 | Source: Ambulatory Visit | Attending: Orthopedic Surgery | Admitting: Orthopedic Surgery

## 2016-12-19 ENCOUNTER — Encounter (HOSPITAL_COMMUNITY): Payer: Self-pay

## 2016-12-19 ENCOUNTER — Ambulatory Visit (HOSPITAL_COMMUNITY)
Admission: RE | Admit: 2016-12-19 | Discharge: 2016-12-19 | Disposition: A | Discharge status: Home / Self Care | Payer: 59 | Source: Ambulatory Visit | Attending: Orthopedic Surgery | Admitting: Orthopedic Surgery

## 2016-12-19 ENCOUNTER — Other Ambulatory Visit: Payer: Self-pay

## 2016-12-19 DIAGNOSIS — Z87891 Personal history of nicotine dependence: Secondary | ICD-10-CM | POA: Diagnosis not present

## 2016-12-19 DIAGNOSIS — K449 Diaphragmatic hernia without obstruction or gangrene: Secondary | ICD-10-CM | POA: Insufficient documentation

## 2016-12-19 DIAGNOSIS — I10 Essential (primary) hypertension: Secondary | ICD-10-CM | POA: Diagnosis not present

## 2016-12-19 DIAGNOSIS — M2550 Pain in unspecified joint: Secondary | ICD-10-CM | POA: Insufficient documentation

## 2016-12-19 DIAGNOSIS — Z01810 Encounter for preprocedural cardiovascular examination: Secondary | ICD-10-CM | POA: Diagnosis not present

## 2016-12-19 DIAGNOSIS — E785 Hyperlipidemia, unspecified: Secondary | ICD-10-CM | POA: Diagnosis not present

## 2016-12-19 DIAGNOSIS — Z01818 Encounter for other preprocedural examination: Secondary | ICD-10-CM | POA: Diagnosis not present

## 2016-12-19 DIAGNOSIS — K219 Gastro-esophageal reflux disease without esophagitis: Secondary | ICD-10-CM | POA: Diagnosis not present

## 2016-12-19 DIAGNOSIS — J4520 Mild intermittent asthma, uncomplicated: Secondary | ICD-10-CM | POA: Diagnosis not present

## 2016-12-19 DIAGNOSIS — M170 Bilateral primary osteoarthritis of knee: Secondary | ICD-10-CM | POA: Insufficient documentation

## 2016-12-19 DIAGNOSIS — Z01812 Encounter for preprocedural laboratory examination: Secondary | ICD-10-CM | POA: Diagnosis not present

## 2016-12-19 DIAGNOSIS — E669 Obesity, unspecified: Secondary | ICD-10-CM | POA: Diagnosis not present

## 2016-12-19 HISTORY — DX: Personal history of urinary calculi: Z87.442

## 2016-12-19 LAB — COMPREHENSIVE METABOLIC PANEL
ALK PHOS: 137 U/L — AB (ref 38–126)
ALT: 37 U/L (ref 14–54)
AST: 28 U/L (ref 15–41)
Albumin: 4.4 g/dL (ref 3.5–5.0)
Anion gap: 9 (ref 5–15)
BILIRUBIN TOTAL: 0.4 mg/dL (ref 0.3–1.2)
BUN: 11 mg/dL (ref 6–20)
CO2: 30 mmol/L (ref 22–32)
CREATININE: 0.59 mg/dL (ref 0.44–1.00)
Calcium: 10.2 mg/dL (ref 8.9–10.3)
Chloride: 99 mmol/L — ABNORMAL LOW (ref 101–111)
Glucose, Bld: 91 mg/dL (ref 65–99)
Potassium: 3.8 mmol/L (ref 3.5–5.1)
Sodium: 138 mmol/L (ref 135–145)
TOTAL PROTEIN: 7.9 g/dL (ref 6.5–8.1)

## 2016-12-19 LAB — URINALYSIS, ROUTINE W REFLEX MICROSCOPIC
Bilirubin Urine: NEGATIVE
GLUCOSE, UA: NEGATIVE mg/dL
Hgb urine dipstick: NEGATIVE
Ketones, ur: NEGATIVE mg/dL
LEUKOCYTES UA: NEGATIVE
Nitrite: NEGATIVE
PH: 7 (ref 5.0–8.0)
Protein, ur: NEGATIVE mg/dL
Specific Gravity, Urine: 1.019 (ref 1.005–1.030)

## 2016-12-19 LAB — CBC WITH DIFFERENTIAL/PLATELET
Basophils Absolute: 0 10*3/uL (ref 0.0–0.1)
Basophils Relative: 0 %
EOS PCT: 1 %
Eosinophils Absolute: 0.1 10*3/uL (ref 0.0–0.7)
HEMATOCRIT: 42.5 % (ref 36.0–46.0)
HEMOGLOBIN: 14 g/dL (ref 12.0–15.0)
LYMPHS ABS: 2.5 10*3/uL (ref 0.7–4.0)
Lymphocytes Relative: 36 %
MCH: 28.8 pg (ref 26.0–34.0)
MCHC: 32.9 g/dL (ref 30.0–36.0)
MCV: 87.4 fL (ref 78.0–100.0)
Monocytes Absolute: 0.5 10*3/uL (ref 0.1–1.0)
Monocytes Relative: 8 %
Neutro Abs: 3.8 10*3/uL (ref 1.7–7.7)
Neutrophils Relative %: 55 %
PLATELETS: 190 10*3/uL (ref 150–400)
RBC: 4.86 MIL/uL (ref 3.87–5.11)
RDW: 13.8 % (ref 11.5–15.5)
WBC: 7.1 10*3/uL (ref 4.0–10.5)

## 2016-12-19 LAB — PROTIME-INR
INR: 1
PROTHROMBIN TIME: 13.2 s (ref 11.4–15.2)

## 2016-12-19 LAB — SURGICAL PCR SCREEN
MRSA, PCR: NEGATIVE
STAPHYLOCOCCUS AUREUS: NEGATIVE

## 2016-12-19 LAB — TYPE AND SCREEN
ABO/RH(D): O POS
Antibody Screen: NEGATIVE

## 2016-12-19 LAB — APTT: aPTT: 38 seconds — ABNORMAL HIGH (ref 24–36)

## 2016-12-19 NOTE — Progress Notes (Signed)
Does not see a cardiolgist  Denies any cardiac studies done recently  Cardiac cath  2003  normal

## 2016-12-19 NOTE — Pre-Procedure Instructions (Signed)
Katherine Tanner  12/19/2016      [facility] - [place] - [place] [address] Phone: [phone] Fax: [phone]  [place], [place] - [address] [place]  [address] Phone: [phone] Fax: [phone]  [place], [place] [address] Phone: [phone] Fax: [phone]  [facility] [place], [place] [address] [place] [address] Phone: [phone] Fax: [phone]  [id number] - [place], [place] [address] Phone: [phone] Fax: [phone]    Your procedure is scheduled on 12-30-2016 Friday .  Report to [facility] Admitting at 5:30 A.M   Call this number if you have problems the morning of surgery:  [phone]     Remember:  Do not eat food or drink liquids after midnight.   Take these medicines the morning of surgery with A SIP OF WATER Albuterol inhaler if needed.Breo Ellipta, Flonase nasal spray if needed,Mucinex if needed,Duoneb nebulizer if needed,loratadine(Claritin)Tramadol(Ultram) if needed     STOP ASPIRIN,ANTIINFLAMATORIES (IBUPROFEN,ALEVE,MOTRIN,ADVIL,GOODY'S POWDERS),HERBAL SUPPLEMENTS,FISH OIL,AND VITAMINS 5-7 DAYS PRIOR TO SURGERY   STOP MOBIC   Do not wear jewelry, make-up or nail polish.  Do not wear lotions, powders, or perfumes, or deoderant.  Do not shave 48 hours prior to surgery.  Men may shave face and neck.  Do not bring valuables to the hospital.  [facility] is not responsible for any belongings or valuables.  Contacts, dentures or bridgework may not be worn into surgery.  Leave your suitcase in the car.  After surgery it may be brought to your room.  For patients admitted to the hospital,  discharge time will be determined by your treatment team.  Patients discharged the day of surgery will not be allowed to drive home.    Special Instructions: [place] - Preparing for Surgery  Before surgery, you can play an important role.  Because skin is not sterile, your skin needs to be as free of germs as possible.  You can reduce the number of germs on you skin by washing with CHG (chlorahexidine gluconate) soap before surgery.  CHG is an antiseptic cleaner which kills germs and bonds with the skin to continue killing germs even after washing.  Please DO NOT use if you have an allergy to CHG or antibacterial soaps.  If your skin becomes reddened/irritated stop using the CHG and inform your nurse when you arrive at Short Stay.  Do not shave (including legs and underarms) for at least 48 hours prior to the first CHG shower.  You may shave your face.  Please follow these instructions carefully:   1.  Shower with CHG Soap the night before surgery and the   morning of Surgery.  2.  If you choose to wash your hair, wash your hair first as usual with your normal shampoo.  3.  After you shampoo, rinse your hair and body thoroughly to remove the  Shampoo.  4.  Use CHG as you would any other liquid soap.  You can apply chg directly  to the skin and wash gently with scrungie or a clean washcloth.  5.  Apply the CHG Soap to your body ONLY FROM THE NECK DOWN.   Do not use on open wounds  or open sores.  Avoid contact with your eyes,  ears, mouth and genitals (private parts).  Wash genitals (private parts) with your normal soap.  6.  Wash thoroughly, paying special attention to the area where your surgery will be performed.  7.  Thoroughly rinse your body with warm water from the neck down.  8.  DO NOT shower/wash with your normal soap after using and rinsing o  the CHG Soap.  9.  Pat yourself dry with a clean towel.            10.  Wear clean pajamas.            11.  Place clean sheets on your  bed the night of your first shower and do not sleep with pets.  Day of Surgery  Do not apply any lotions/deodorants the morning of surgery.  Please wear clean clothes to the hospital/surgery center.   Please read over the following fact sheets that you were given. MRSA Information and Surgical Site Infection Prevention,Incentive Spirmetry

## 2016-12-27 ENCOUNTER — Other Ambulatory Visit: Payer: Self-pay

## 2016-12-27 NOTE — Patient Outreach (Signed)
[facility]) Care Management  12/27/2016  Katherine Tanner [birth year] [id number]    Subjective: Telephone call to patient. Discussed [facility] pre-op call. Patient voices understanding and agrees to pre-op call.    Objective: Per chart review, patient with unilateral primary osteoarthritis, left knee is scheduled for Let Total Knee Arthroplasty on 12/30/2016. History of HTN, asthma, bronchitis.  Assessment: Received [facility]MR pre-op referral on 6/25. Pre-Op call completed. Patient reports she has completed FMLA forms and short term disability forms adding she is "all ready". Mrs. Trollinger-Smith states she has had her right knee replaced a few years ago and knows what to expect. She states she has already checked in to the [facility] plan process and is aware of the process to access the hospital indemnity plan benefit.   Post procedure equipment/home health- RNCM reinforced that the inpatient case manager in the hospital will arrange home health and equipment prior to discharge if needed. She reports she has a bedside commode and a walker.   RNCM discussed [place] benefit is higher when using a [place] facility/pharmacy. RNCM reinforced that if she is discharged home on the weekend with a new prescription, she will have to use a local pharmacy due to [facility] pharmacies are not open on the weekend.     Support:  Client reports that she has someone to assist with pharmacy needs at discharge. Client reports she has transportation to her follow up appointment.     Discussed Advanced Directives. RNCM reinforced that Spiritual care department is available to assist [employer] employees with Advanced Directives as needed.  No other medical issues identified and no additional community resource information needs at this time.   Patient is agreeable to follow up post procedure call.  Plan: telephonic RNCM will follow up post discharge within 3 business days of  notification.  Thea Silversmith, RN, MSN, [place] Coordinator Cell: [phone]

## 2016-12-28 MED FILL — MONTELUKAST SOD 10 MG TAB: 10 | 90 days supply | Qty: 90 | Fill #2

## 2016-12-28 MED FILL — BREO ELLIPTA 100-25 MCG INH: 100-25 | 30 days supply | Qty: 60 | Fill #7

## 2016-12-29 MED ORDER — CLINDAMYCIN PHOSPHATE 900 MG/50ML IV SOLN
900.0000 mg | INTRAVENOUS | Status: AC
  Administered 2016-12-30: 900 mg via INTRAVENOUS
  Filled 2016-12-29: qty 50

## 2016-12-30 ENCOUNTER — Inpatient Hospital Stay (HOSPITAL_COMMUNITY): Payer: 59 | Admitting: Certified Registered Nurse Anesthetist

## 2016-12-30 ENCOUNTER — Encounter (HOSPITAL_COMMUNITY)
Admission: RE | Disposition: A | Discharge status: Home Health | Payer: Self-pay | Source: Ambulatory Visit | Attending: Orthopedic Surgery

## 2016-12-30 ENCOUNTER — Encounter (HOSPITAL_COMMUNITY): Payer: Self-pay | Admitting: *Deleted

## 2016-12-30 ENCOUNTER — Inpatient Hospital Stay (HOSPITAL_COMMUNITY)
Admission: RE | Admit: 2016-12-30 | Discharge: 2017-01-01 | DRG: 470 | Disposition: A | Discharge status: Home Health | Payer: 59 | Source: Ambulatory Visit | Attending: Orthopedic Surgery | Admitting: Orthopedic Surgery

## 2016-12-30 DIAGNOSIS — E7800 Pure hypercholesterolemia, unspecified: Secondary | ICD-10-CM

## 2016-12-30 DIAGNOSIS — Z83511 Family history of glaucoma: Secondary | ICD-10-CM

## 2016-12-30 DIAGNOSIS — I10 Essential (primary) hypertension: Secondary | ICD-10-CM

## 2016-12-30 DIAGNOSIS — Z8249 Family history of ischemic heart disease and other diseases of the circulatory system: Secondary | ICD-10-CM | POA: Diagnosis not present

## 2016-12-30 DIAGNOSIS — G8918 Other acute postprocedural pain: Secondary | ICD-10-CM | POA: Diagnosis not present

## 2016-12-30 DIAGNOSIS — Z7951 Long term (current) use of inhaled steroids: Secondary | ICD-10-CM | POA: Diagnosis not present

## 2016-12-30 DIAGNOSIS — M1712 Unilateral primary osteoarthritis, left knee: Secondary | ICD-10-CM | POA: Diagnosis not present

## 2016-12-30 DIAGNOSIS — Z791 Long term (current) use of non-steroidal anti-inflammatories (NSAID): Secondary | ICD-10-CM

## 2016-12-30 DIAGNOSIS — D62 Acute posthemorrhagic anemia: Secondary | ICD-10-CM | POA: Diagnosis not present

## 2016-12-30 DIAGNOSIS — Z79899 Other long term (current) drug therapy: Secondary | ICD-10-CM | POA: Diagnosis not present

## 2016-12-30 DIAGNOSIS — K449 Diaphragmatic hernia without obstruction or gangrene: Secondary | ICD-10-CM | POA: Diagnosis not present

## 2016-12-30 DIAGNOSIS — Z96651 Presence of right artificial knee joint: Secondary | ICD-10-CM | POA: Diagnosis present

## 2016-12-30 DIAGNOSIS — K219 Gastro-esophageal reflux disease without esophagitis: Secondary | ICD-10-CM | POA: Diagnosis present

## 2016-12-30 DIAGNOSIS — J4520 Mild intermittent asthma, uncomplicated: Secondary | ICD-10-CM | POA: Diagnosis present

## 2016-12-30 DIAGNOSIS — Z833 Family history of diabetes mellitus: Secondary | ICD-10-CM

## 2016-12-30 HISTORY — PX: TOTAL KNEE ARTHROPLASTY: SHX125

## 2016-12-30 SURGERY — ARTHROPLASTY, KNEE, TOTAL
Anesthesia: Spinal | Site: Knee | Laterality: Left

## 2016-12-30 MED ORDER — METHOCARBAMOL 750 MG PO TABS: 750.0000 mg | ORAL_TABLET | Freq: Three times a day (TID) | ORAL | 0 refills | Status: DC | PRN

## 2016-12-30 MED ORDER — DEXAMETHASONE SODIUM PHOSPHATE 10 MG/ML IJ SOLN
INTRAMUSCULAR | Status: AC
  Filled 2016-12-30: qty 1

## 2016-12-30 MED ORDER — DOCUSATE SODIUM 100 MG PO CAPS
100.0000 mg | ORAL_CAPSULE | Freq: Two times a day (BID) | ORAL | Status: DC
  Administered 2016-12-30 – 2017-01-01 (×4): 100 mg via ORAL
  Filled 2016-12-30 (×4): qty 1

## 2016-12-30 MED ORDER — ASPIRIN EC 325 MG PO TBEC
325.0000 mg | DELAYED_RELEASE_TABLET | Freq: Two times a day (BID) | ORAL | Status: DC
  Administered 2016-12-30 – 2017-01-01 (×4): 325 mg via ORAL
  Filled 2016-12-30 (×4): qty 1

## 2016-12-30 MED ORDER — PROPOFOL 10 MG/ML IV BOLUS
INTRAVENOUS | Status: AC
  Filled 2016-12-30: qty 40

## 2016-12-30 MED ORDER — BUPIVACAINE HCL (PF) 0.5 % IJ SOLN
INTRAMUSCULAR | Status: DC | PRN
  Administered 2016-12-30: 25 mL via PERINEURAL

## 2016-12-30 MED ORDER — BUPIVACAINE LIPOSOME 1.3 % IJ SUSP
INTRAMUSCULAR | Status: DC | PRN
  Administered 2016-12-30: 20 mL

## 2016-12-30 MED ORDER — HYDROCHLOROTHIAZIDE 25 MG PO TABS
25.0000 mg | ORAL_TABLET | Freq: Every day | ORAL | Status: DC
  Administered 2016-12-30 – 2017-01-01 (×3): 25 mg via ORAL
  Filled 2016-12-30 (×3): qty 1

## 2016-12-30 MED ORDER — BISACODYL 5 MG PO TBEC: 5.0000 mg | DELAYED_RELEASE_TABLET | Freq: Every day | ORAL | Status: DC | PRN

## 2016-12-30 MED ORDER — FENTANYL CITRATE (PF) 250 MCG/5ML IJ SOLN
INTRAMUSCULAR | Status: AC
  Filled 2016-12-30: qty 5

## 2016-12-30 MED ORDER — TRANEXAMIC ACID 1000 MG/10ML IV SOLN
1000.0000 mg | INTRAVENOUS | Status: AC
  Administered 2016-12-30: 1000 mg via INTRAVENOUS
  Filled 2016-12-30: qty 10

## 2016-12-30 MED ORDER — LACTATED RINGERS IV SOLN
INTRAVENOUS | Status: DC | PRN
  Administered 2016-12-30 (×2): via INTRAVENOUS

## 2016-12-30 MED ORDER — PROMETHAZINE HCL 25 MG/ML IJ SOLN: 12.5000 mg | Freq: Four times a day (QID) | INTRAMUSCULAR | Status: DC | PRN

## 2016-12-30 MED ORDER — CELECOXIB 200 MG PO CAPS
200.0000 mg | ORAL_CAPSULE | Freq: Two times a day (BID) | ORAL | Status: DC
  Administered 2016-12-30 – 2017-01-01 (×5): 200 mg via ORAL
  Filled 2016-12-30 (×5): qty 1

## 2016-12-30 MED ORDER — HYDROMORPHONE HCL 1 MG/ML IJ SOLN: 0.2500 mg | INTRAMUSCULAR | Status: DC | PRN

## 2016-12-30 MED ORDER — PROMETHAZINE HCL 25 MG/ML IJ SOLN: 6.2500 mg | INTRAMUSCULAR | Status: DC | PRN

## 2016-12-30 MED ORDER — ONDANSETRON HCL 4 MG/2ML IJ SOLN
4.0000 mg | Freq: Four times a day (QID) | INTRAMUSCULAR | Status: DC | PRN
  Administered 2016-12-31: 4 mg via INTRAVENOUS
  Filled 2016-12-30: qty 2

## 2016-12-30 MED ORDER — GABAPENTIN 300 MG PO CAPS
300.0000 mg | ORAL_CAPSULE | Freq: Two times a day (BID) | ORAL | Status: DC
  Administered 2016-12-30 – 2017-01-01 (×5): 300 mg via ORAL
  Filled 2016-12-30 (×5): qty 1

## 2016-12-30 MED ORDER — MAGNESIUM CITRATE PO SOLN: 1.0000 | Freq: Once | ORAL | Status: DC | PRN

## 2016-12-30 MED ORDER — POLYETHYLENE GLYCOL 3350 17 G PO PACK: 17.0000 g | PACK | Freq: Every day | ORAL | Status: DC | PRN

## 2016-12-30 MED ORDER — ONDANSETRON HCL 4 MG/2ML IJ SOLN
INTRAMUSCULAR | Status: AC
  Filled 2016-12-30: qty 2

## 2016-12-30 MED ORDER — TRANEXAMIC ACID 1000 MG/10ML IV SOLN
1000.0000 mg | Freq: Once | INTRAVENOUS | Status: AC
  Administered 2016-12-30: 1000 mg via INTRAVENOUS
  Filled 2016-12-30: qty 10

## 2016-12-30 MED ORDER — BUPIVACAINE HCL (PF) 0.5 % IJ SOLN
INTRAMUSCULAR | Status: DC | PRN
  Administered 2016-12-30: 20 mL

## 2016-12-30 MED ORDER — ACETAMINOPHEN 650 MG RE SUPP: 650.0000 mg | Freq: Four times a day (QID) | RECTAL | Status: DC | PRN

## 2016-12-30 MED ORDER — ALUM & MAG HYDROXIDE-SIMETH 200-200-20 MG/5ML PO SUSP: 30.0000 mL | ORAL | Status: DC | PRN

## 2016-12-30 MED ORDER — PROPOFOL 10 MG/ML IV BOLUS
INTRAVENOUS | Status: DC | PRN
  Administered 2016-12-30 (×2): 20 mg via INTRAVENOUS

## 2016-12-30 MED ORDER — LORATADINE 10 MG PO TABS
10.0000 mg | ORAL_TABLET | Freq: Every day | ORAL | Status: DC
  Administered 2016-12-30 – 2017-01-01 (×3): 10 mg via ORAL
  Filled 2016-12-30 (×3): qty 1

## 2016-12-30 MED ORDER — OXYCODONE HCL 5 MG PO TABS
5.0000 mg | ORAL_TABLET | ORAL | Status: DC | PRN
  Administered 2016-12-30 – 2017-01-01 (×9): 10 mg via ORAL
  Filled 2016-12-30 (×9): qty 2

## 2016-12-30 MED ORDER — IPRATROPIUM-ALBUTEROL 0.5-2.5 (3) MG/3ML IN SOLN: 3.0000 mL | Freq: Four times a day (QID) | RESPIRATORY_TRACT | Status: DC | PRN

## 2016-12-30 MED ORDER — SODIUM CHLORIDE 0.9 % IR SOLN
Status: DC | PRN
  Administered 2016-12-30: 3000 mL

## 2016-12-30 MED ORDER — LOSARTAN POTASSIUM 50 MG PO TABS
100.0000 mg | ORAL_TABLET | Freq: Every day | ORAL | Status: DC
  Administered 2016-12-30 – 2017-01-01 (×3): 100 mg via ORAL
  Filled 2016-12-30 (×3): qty 2

## 2016-12-30 MED ORDER — CHLORHEXIDINE GLUCONATE 4 % EX LIQD: 60.0000 mL | Freq: Once | CUTANEOUS | Status: DC

## 2016-12-30 MED ORDER — METHOCARBAMOL 1000 MG/10ML IJ SOLN
500.0000 mg | Freq: Four times a day (QID) | INTRAMUSCULAR | Status: DC | PRN
  Filled 2016-12-30: qty 5

## 2016-12-30 MED ORDER — CLINDAMYCIN PHOSPHATE 600 MG/50ML IV SOLN
600.0000 mg | Freq: Four times a day (QID) | INTRAVENOUS | Status: AC
  Administered 2016-12-30 (×2): 600 mg via INTRAVENOUS
  Filled 2016-12-30 (×2): qty 50

## 2016-12-30 MED ORDER — ALBUTEROL SULFATE (2.5 MG/3ML) 0.083% IN NEBU: 3.0000 mL | INHALATION_SOLUTION | Freq: Four times a day (QID) | RESPIRATORY_TRACT | Status: DC | PRN

## 2016-12-30 MED ORDER — MIDAZOLAM HCL 2 MG/2ML IJ SOLN
INTRAMUSCULAR | Status: AC
  Filled 2016-12-30: qty 2

## 2016-12-30 MED ORDER — 0.9 % SODIUM CHLORIDE (POUR BTL) OPTIME
TOPICAL | Status: DC | PRN
  Administered 2016-12-30: 1000 mL

## 2016-12-30 MED ORDER — SODIUM CHLORIDE 0.9 % IJ SOLN
INTRAMUSCULAR | Status: DC | PRN
  Administered 2016-12-30: 20 mL via INTRAVENOUS

## 2016-12-30 MED ORDER — FLUTICASONE PROPIONATE 50 MCG/ACT NA SUSP
2.0000 | Freq: Every day | NASAL | Status: DC
  Filled 2016-12-30: qty 16

## 2016-12-30 MED ORDER — ACETAMINOPHEN 325 MG PO TABS: 650.0000 mg | ORAL_TABLET | Freq: Four times a day (QID) | ORAL | Status: DC | PRN

## 2016-12-30 MED ORDER — BUPIVACAINE HCL (PF) 0.5 % IJ SOLN
INTRAMUSCULAR | Status: AC
  Filled 2016-12-30: qty 30

## 2016-12-30 MED ORDER — DOCUSATE SODIUM 100 MG PO CAPS: 100.0000 mg | ORAL_CAPSULE | Freq: Two times a day (BID) | ORAL | 0 refills | Status: DC

## 2016-12-30 MED ORDER — MONTELUKAST SODIUM 10 MG PO TABS
10.0000 mg | ORAL_TABLET | Freq: Every day | ORAL | Status: DC
  Administered 2016-12-30 – 2016-12-31 (×2): 10 mg via ORAL
  Filled 2016-12-30 (×2): qty 1

## 2016-12-30 MED ORDER — DIPHENHYDRAMINE HCL 12.5 MG/5ML PO ELIX: 12.5000 mg | ORAL_SOLUTION | ORAL | Status: DC | PRN

## 2016-12-30 MED ORDER — HYDROMORPHONE HCL 1 MG/ML IJ SOLN
0.5000 mg | INTRAMUSCULAR | Status: DC | PRN
  Administered 2016-12-30 – 2016-12-31 (×3): 1 mg via INTRAVENOUS
  Filled 2016-12-30 (×3): qty 1

## 2016-12-30 MED ORDER — ASPIRIN EC 325 MG PO TBEC: 325.0000 mg | DELAYED_RELEASE_TABLET | Freq: Two times a day (BID) | ORAL | 0 refills | Status: DC

## 2016-12-30 MED ORDER — METHOCARBAMOL 500 MG PO TABS
500.0000 mg | ORAL_TABLET | Freq: Four times a day (QID) | ORAL | Status: DC | PRN
  Administered 2016-12-30 – 2017-01-01 (×2): 500 mg via ORAL
  Filled 2016-12-30 (×2): qty 1

## 2016-12-30 MED ORDER — DEXAMETHASONE SODIUM PHOSPHATE 10 MG/ML IJ SOLN
INTRAMUSCULAR | Status: DC | PRN
  Administered 2016-12-30: 10 mg via INTRAVENOUS

## 2016-12-30 MED ORDER — OXYCODONE-ACETAMINOPHEN 5-325 MG PO TABS: 1.0000 | ORAL_TABLET | ORAL | 0 refills | Status: DC | PRN

## 2016-12-30 MED ORDER — LOSARTAN POTASSIUM-HCTZ 100-25 MG PO TABS: 1.0000 | ORAL_TABLET | Freq: Every day | ORAL | Status: DC

## 2016-12-30 MED ORDER — DEXAMETHASONE SODIUM PHOSPHATE 10 MG/ML IJ SOLN
10.0000 mg | Freq: Two times a day (BID) | INTRAMUSCULAR | Status: AC
  Administered 2016-12-30 – 2016-12-31 (×3): 10 mg via INTRAVENOUS
  Filled 2016-12-30 (×3): qty 1

## 2016-12-30 MED ORDER — FENTANYL CITRATE (PF) 100 MCG/2ML IJ SOLN
INTRAMUSCULAR | Status: DC | PRN
  Administered 2016-12-30 (×2): 50 ug via INTRAVENOUS

## 2016-12-30 MED ORDER — ONDANSETRON HCL 4 MG/2ML IJ SOLN
INTRAMUSCULAR | Status: DC | PRN
  Administered 2016-12-30: 4 mg via INTRAVENOUS

## 2016-12-30 MED ORDER — MIDAZOLAM HCL 5 MG/5ML IJ SOLN
INTRAMUSCULAR | Status: DC | PRN
  Administered 2016-12-30: 2 mg via INTRAVENOUS

## 2016-12-30 MED ORDER — ONDANSETRON HCL 4 MG PO TABS
4.0000 mg | ORAL_TABLET | Freq: Four times a day (QID) | ORAL | Status: DC | PRN
  Administered 2016-12-30: 4 mg via ORAL
  Filled 2016-12-30: qty 1

## 2016-12-30 MED ORDER — PROPOFOL 500 MG/50ML IV EMUL
INTRAVENOUS | Status: DC | PRN
  Administered 2016-12-30: 75 ug/kg/min via INTRAVENOUS

## 2016-12-30 MED ORDER — FLUTICASONE FUROATE-VILANTEROL 100-25 MCG/INH IN AEPB
1.0000 | INHALATION_SPRAY | Freq: Every day | RESPIRATORY_TRACT | Status: DC
  Filled 2016-12-30: qty 28

## 2016-12-30 MED ORDER — SODIUM CHLORIDE 0.9 % IV SOLN
INTRAVENOUS | Status: DC
  Administered 2016-12-30: 11:00:00 via INTRAVENOUS

## 2016-12-30 MED ORDER — DEXTROSE 5 % IV SOLN
INTRAVENOUS | Status: DC | PRN
  Administered 2016-12-30: 25 ug/min via INTRAVENOUS

## 2016-12-30 SURGICAL SUPPLY — 60 items
BANDAGE ACE 6X5 VEL STRL LF (GAUZE/BANDAGES/DRESSINGS) ×2 IMPLANT
BANDAGE ESMARK 6X9 LF (GAUZE/BANDAGES/DRESSINGS) ×1 IMPLANT
BENZOIN TINCTURE PRP APPL 2/3 (GAUZE/BANDAGES/DRESSINGS) ×2 IMPLANT
BLADE SAGITTAL 25.0X1.19X90 (BLADE) ×2 IMPLANT
BLADE SAW SAG 90X13X1.27 (BLADE) ×2 IMPLANT
BNDG ESMARK 6X9 LF (GAUZE/BANDAGES/DRESSINGS) ×2
BOWL SMART MIX CTS (DISPOSABLE) ×2 IMPLANT
CAPT KNEE TOTAL 3 ATTUNE ×2 IMPLANT
CEMENT HV SMART SET (Cement) ×4 IMPLANT
CLSR STERI-STRIP ANTIMIC 1/2X4 (GAUZE/BANDAGES/DRESSINGS) ×2 IMPLANT
COVER SURGICAL LIGHT HANDLE (MISCELLANEOUS) ×4 IMPLANT
CUFF TOURNIQUET SINGLE 34IN LL (TOURNIQUET CUFF) ×2 IMPLANT
CUFF TOURNIQUET SINGLE 44IN (TOURNIQUET CUFF) IMPLANT
DECANTER SPIKE VIAL GLASS SM (MISCELLANEOUS) ×2 IMPLANT
DRAPE EXTREMITY T 121X128X90 (DRAPE) ×2 IMPLANT
DRAPE U-SHAPE 47X51 STRL (DRAPES) ×2 IMPLANT
DRSG AQUACEL AG ADV 3.5X10 (GAUZE/BANDAGES/DRESSINGS) ×2 IMPLANT
DRSG PAD ABDOMINAL 8X10 ST (GAUZE/BANDAGES/DRESSINGS) ×2 IMPLANT
DURAPREP 26ML APPLICATOR (WOUND CARE) ×2 IMPLANT
ELECT CAUTERY BLADE 6.4 (BLADE) ×2 IMPLANT
ELECT REM PT RETURN 9FT ADLT (ELECTROSURGICAL) ×2
ELECTRODE REM PT RTRN 9FT ADLT (ELECTROSURGICAL) ×1 IMPLANT
EVACUATOR 1/8 PVC DRAIN (DRAIN) IMPLANT
FACESHIELD WRAPAROUND (MASK) ×2 IMPLANT
GAUZE SPONGE 4X4 12PLY STRL (GAUZE/BANDAGES/DRESSINGS) ×2 IMPLANT
GLOVE BIOGEL PI IND STRL 8 (GLOVE) ×2 IMPLANT
GLOVE BIOGEL PI INDICATOR 8 (GLOVE) ×2
GLOVE ECLIPSE 7.5 STRL STRAW (GLOVE) ×4 IMPLANT
GOWN STRL REUS W/ TWL LRG LVL3 (GOWN DISPOSABLE) ×1 IMPLANT
GOWN STRL REUS W/ TWL XL LVL3 (GOWN DISPOSABLE) ×2 IMPLANT
GOWN STRL REUS W/TWL LRG LVL3 (GOWN DISPOSABLE) ×1
GOWN STRL REUS W/TWL XL LVL3 (GOWN DISPOSABLE) ×2
HANDPIECE INTERPULSE COAX TIP (DISPOSABLE) ×2
HOOD PEEL AWAY FACE SHEILD DIS (HOOD) ×4 IMPLANT
IMMOBILIZER KNEE 22 (SOFTGOODS) ×2 IMPLANT
IMMOBILIZER KNEE 22 UNIV (SOFTGOODS) ×2 IMPLANT
KIT BASIN OR (CUSTOM PROCEDURE TRAY) ×2 IMPLANT
KIT ROOM TURNOVER OR (KITS) ×2 IMPLANT
MANIFOLD NEPTUNE II (INSTRUMENTS) ×2 IMPLANT
NEEDLE 22X1 1/2 (OR ONLY) (NEEDLE) ×2 IMPLANT
NS IRRIG 1000ML POUR BTL (IV SOLUTION) ×2 IMPLANT
PACK TOTAL JOINT (CUSTOM PROCEDURE TRAY) ×2 IMPLANT
PAD ARMBOARD 7.5X6 YLW CONV (MISCELLANEOUS) ×4 IMPLANT
PADDING CAST COTTON 6X4 STRL (CAST SUPPLIES) ×2 IMPLANT
SET HNDPC FAN SPRY TIP SCT (DISPOSABLE) ×1 IMPLANT
STAPLER VISISTAT 35W (STAPLE) IMPLANT
STRIP CLOSURE SKIN 1/2X4 (GAUZE/BANDAGES/DRESSINGS) IMPLANT
SUCTION FRAZIER HANDLE 10FR (MISCELLANEOUS) ×1
SUCTION TUBE FRAZIER 10FR DISP (MISCELLANEOUS) ×1 IMPLANT
SUT MNCRL AB 3-0 PS2 18 (SUTURE) IMPLANT
SUT VIC AB 0 CTB1 27 (SUTURE) ×4 IMPLANT
SUT VIC AB 1 CT1 27 (SUTURE) ×2
SUT VIC AB 1 CT1 27XBRD ANBCTR (SUTURE) ×2 IMPLANT
SUT VIC AB 2-0 CTB1 (SUTURE) ×4 IMPLANT
SYR 50ML LL SCALE MARK (SYRINGE) ×2 IMPLANT
TOWEL OR 17X24 6PK STRL BLUE (TOWEL DISPOSABLE) ×2 IMPLANT
TOWEL OR 17X26 10 PK STRL BLUE (TOWEL DISPOSABLE) ×2 IMPLANT
TRAY CATH 16FR W/PLASTIC CATH (SET/KITS/TRAYS/PACK) IMPLANT
TRAY FOLEY W/METER SILVER 16FR (SET/KITS/TRAYS/PACK) IMPLANT
WRAP KNEE MAXI GEL POST OP (GAUZE/BANDAGES/DRESSINGS) ×2 IMPLANT

## 2016-12-30 NOTE — Anesthesia Procedure Notes (Signed)
Procedure Name: MAC Date/Time: 12/30/2016 7:40 AM Performed by: Everlean Cherry A Pre-anesthesia Checklist: Patient identified, Emergency Drugs available, Suction available and Patient being monitored Patient Re-evaluated:Patient Re-evaluated prior to inductionOxygen Delivery Method: Simple face mask Placement Confirmation: positive ETCO2 and breath sounds checked- equal and bilateral

## 2016-12-30 NOTE — H&P (Signed)
TOTAL KNEE ADMISSION H&P  Patient is being admitted for left total knee arthroplasty.  Subjective:  Chief Complaint:left knee pain.  HPI: Katherine Tanner, 56 y.o. female, has a history of pain and functional disability in the left knee due to arthritis and has failed non-surgical conservative treatments for greater than 12 weeks to includeNSAID's and/or analgesics, corticosteriod injections, viscosupplementation injections, flexibility and strengthening excercises, use of assistive devices, weight reduction as appropriate and activity modification.  Onset of symptoms was gradual, starting 5 years ago with gradually worsening course since that time. The patient noted prior procedures on the knee to include  arthroscopy and menisectomy on the left knee(s).  Patient currently rates pain in the left knee(s) at 9 out of 10 with activity. Patient has night pain, worsening of pain with activity and weight bearing, pain that interferes with activities of daily living, pain with passive range of motion, crepitus and joint swelling.  Patient has evidence of periarticular osteophytes, joint subluxation and joint space narrowing by imaging studies. This patient has had failure of all reasonable conservative care. There is no active infection.  Patient Active Problem List   Diagnosis Date Noted  . Acute low back pain without sciatica 11/10/2016  . Obesity 05/24/2016  . Hyperglycemia 02/24/2014  . Osteoarthritis of right knee 11/15/2013  . Osteoarthritis of left knee 11/15/2013  . Alkaline phosphatase elevation 01/02/2012  . Hyperlipidemia 01/02/2012  . Routine general medical examination at a health care facility 12/09/2011  . 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 POLYARTHRITIS 01/28/2010  . ANGIOEDEMA 03/27/2007  . Essential hypertension 03/19/2007  . Perennial allergic rhinitis with seasonal variation 03/19/2007  . Moderate intermittent asthma 03/19/2007  . GERD 03/19/2007  . HIATAL  HERNIA 03/19/2007  . GESTATIONAL DIABETES 03/19/2007   Past Medical History:  Diagnosis Date  . Allergy    takes Claritin daily and Flonase daily  . Arthritis   . Asthma    uses Albuterol daily as needed;Singulair nightly;DUlera daily  . GERD (gastroesophageal reflux disease)    occasionally will take OTC meds but states she just watches what she eats  . H/O hiatal hernia   . History of bronchitis    last time 1yr ago  . History of colon polyps   . History of kidney stones   . History of shingles   . Hypertension    takes Hyzaar daily  . Joint pain   . Joint swelling   . Pneumonia    hx of;last time at least 217yrago  . PONV (postoperative nausea and vomiting)     Past Surgical History:  Procedure Laterality Date  . BREAST EXCISIONAL BIOPSY Right   . CARDIAC CATHETERIZATION    . COLONOSCOPY    . ESOPHAGOGASTRODUODENOSCOPY    . KNEE SURGERY Right    arthroscopy  . Right ganglion cyst     x 2  . right lumpectomy  around 1983  . STERIOD INJECTION Left 11/15/2013   Procedure: Marcaine/STEROID INJECTION;  Surgeon: JoAlta CorningMD;  [facility];  Laterality: Left;  . TOTAL KNEE ARTHROPLASTY Right 11/15/2013   Procedure: RIGHT TOTAL KNEE ARTHROPLASTY;  Surgeon: JoAlta CorningMD;  [facility];  Laterality: Right;  . TUBAL LIGATION      Prescriptions Prior to Admission  Medication Sig Dispense Refill Last Dose  . albuterol (PROVENTIL HFA;VENTOLIN HFA) 108 (90 Base) MCG/ACT inhaler Inhale 2 puffs into the lungs every 6 (six) hours as needed for  wheezing or shortness of breath. 18 g 12 Past Week at Unknown time  . BREO ELLIPTA 100-25 MCG/INH AEPB INHALE 1 PUFF INTO THE LUNGS DAILY. RINSE MOUTH. 60 each 12 12/30/2016 at 0430  . Calcium Citrate-Vitamin D (CALCIUM + D PO) Take 1 tablet by mouth daily.   Past Week at Unknown time  . EPINEPHrine 0.3 mg/0.3 mL IJ SOAJ injection Inject 0.3 mLs (0.3 mg total) into the muscle once. 1  Device 12 Taking  . fluticasone (FLONASE) 50 MCG/ACT nasal spray Place 2 sprays into both nostrils daily. 48 g 3 12/29/2016 at Unknown time  . guaiFENesin (MUCINEX) 600 MG 12 hr tablet Take 600 mg by mouth 2 (two) times daily as needed.    12/29/2016 at Unknown time  . HYDROcodone-acetaminophen (NORCO/VICODIN) 5-325 MG tablet Take 1 tablet by mouth at bedtime as needed for moderate pain.   0 12/29/2016 at Unknown time  . loratadine (CLARITIN) 10 MG tablet Take 10 mg by mouth daily.    12/29/2016 at Unknown time  . losartan-hydrochlorothiazide (HYZAAR) 100-25 MG tablet Take 1 tablet by mouth daily. 90 tablet 3 12/29/2016 at Unknown time  . meloxicam (MOBIC) 15 MG tablet Take 1 tablet by mouth daily.  1 12/29/2016 at Unknown time  . montelukast (SINGULAIR) 10 MG tablet Take 1 tablet (10 mg total) by mouth at bedtime. 90 tablet 3 12/29/2016 at Unknown time  . Multiple Vitamin (MULTIVITAMIN PO) Take 1 tablet by mouth daily.    Past Week at Unknown time  . traMADol (ULTRAM) 50 MG tablet Take 50-100 mg by mouth every 6 (six) hours as needed for moderate pain.    12/29/2016 at Unknown time  . cyclobenzaprine (FLEXERIL) 10 MG tablet Take 0.5-1 tablets (5-10 mg total) by mouth at bedtime as needed for muscle spasms. 15 tablet 0 More than a month at Unknown time  . ipratropium-albuterol (DUONEB) 0.5-2.5 (3) MG/3ML SOLN Take 3 mLs by nebulization every 6 (six) hours as needed. 75 mL 12 More than a month at Unknown time  . Nebulizers (COMPRESSOR/NEBULIZER) MISC Use as directed 1 each 0 Taking   Allergies  Allergen Reactions  . Ace Inhibitors Shortness Of Breath and Cough    Cough/ breathing problems   . Amoxicillin-Pot Clavulanate Swelling    SWELLING REACTION UNSPECIFIED   . Influenza Virus Vacc Split Pf     UNSPECIFIED REACTION   . Shellfish Allergy Itching    Social History  Substance Use Topics  . Smoking status: Never Smoker  . Smokeless tobacco: Never Used  . Alcohol use No    Family History   Problem Relation Age of Onset  . Diabetes Father   . Sarcoidosis Mother   . Glaucoma Mother   . Hypertension Mother   . Breast cancer Neg Hx      ROS ROS: I have reviewed the patient's review of systems thoroughly and there are no positive responses as relates to the HPI.  Objective:  Physical Exam  Vital signs in last 24 hours: Temp:  [98.4 F (36.9 C)] 98.4 F (36.9 C) (06/29 0547) Pulse Rate:  [80] 80 (06/29 0547) Resp:  [20] 20 (06/29 0547) BP: (133)/(63) 133/63 (06/29 0547) SpO2:  [100 %] 100 % (06/29 0547) Well-developed well-nourished patient in no acute distress. Alert and oriented x3 HEENT:within normal limits Cardiac: Regular rate and rhythm Pulmonary: Lungs clear to auscultation Abdomen: Soft and nontender.  Normal active bowel sounds  Musculoskeletal: (l knee: Painful rom lateral joint pain 1+ effusion no instability)  Labs: Recent Results (from the past 2160 hour(s))  Urinalysis, Routine w reflex microscopic     Status: None   Collection Time: 12/19/16  1:57 PM  Result Value Ref Range   Color, Urine YELLOW YELLOW   APPearance CLEAR CLEAR   Specific Gravity, Urine 1.019 1.005 - 1.030   pH 7.0 5.0 - 8.0   Glucose, UA NEGATIVE NEGATIVE mg/dL   Hgb urine dipstick NEGATIVE NEGATIVE   Bilirubin Urine NEGATIVE NEGATIVE   Ketones, ur NEGATIVE NEGATIVE mg/dL   Protein, ur NEGATIVE NEGATIVE mg/dL   Nitrite NEGATIVE NEGATIVE   Leukocytes, UA NEGATIVE NEGATIVE  Surgical pcr screen     Status: None   Collection Time: 12/19/16  1:57 PM  Result Value Ref Range   MRSA, PCR NEGATIVE NEGATIVE   Staphylococcus aureus NEGATIVE NEGATIVE    Comment:        The Xpert SA Assay (FDA approved for NASAL specimens in patients over 47 years of age), is one component of a comprehensive surveillance program.  Test performance has been validated by [facility] for patients greater than or equal to 51 year old. It is not intended to diagnose infection nor to guide or  monitor treatment.   APTT     Status: Abnormal   Collection Time: 12/19/16  1:58 PM  Result Value Ref Range   aPTT 38 (H) 24 - 36 seconds    Comment:        IF BASELINE aPTT IS ELEVATED, SUGGEST PATIENT RISK ASSESSMENT BE USED TO DETERMINE APPROPRIATE ANTICOAGULANT THERAPY.   CBC WITH DIFFERENTIAL     Status: None   Collection Time: 12/19/16  1:58 PM  Result Value Ref Range   WBC 7.1 4.0 - 10.5 K/uL   RBC 4.86 3.87 - 5.11 MIL/uL   Hemoglobin 14.0 12.0 - 15.0 g/dL   HCT 42.5 36.0 - 46.0 %   MCV 87.4 78.0 - 100.0 fL   MCH 28.8 26.0 - 34.0 pg   MCHC 32.9 30.0 - 36.0 g/dL   RDW 13.8 11.5 - 15.5 %   Platelets 190 150 - 400 K/uL   Neutrophils Relative % 55 %   Neutro Abs 3.8 1.7 - 7.7 K/uL   Lymphocytes Relative 36 %   Lymphs Abs 2.5 0.7 - 4.0 K/uL   Monocytes Relative 8 %   Monocytes Absolute 0.5 0.1 - 1.0 K/uL   Eosinophils Relative 1 %   Eosinophils Absolute 0.1 0.0 - 0.7 K/uL   Basophils Relative 0 %   Basophils Absolute 0.0 0.0 - 0.1 K/uL  Comprehensive metabolic panel     Status: Abnormal   Collection Time: 12/19/16  1:58 PM  Result Value Ref Range   Sodium 138 135 - 145 mmol/L   Potassium 3.8 3.5 - 5.1 mmol/L   Chloride 99 (L) 101 - 111 mmol/L   CO2 30 22 - 32 mmol/L   Glucose, Bld 91 65 - 99 mg/dL   BUN 11 6 - 20 mg/dL   Creatinine, Ser 0.59 0.44 - 1.00 mg/dL   Calcium 10.2 8.9 - 10.3 mg/dL   Total Protein 7.9 6.5 - 8.1 g/dL   Albumin 4.4 3.5 - 5.0 g/dL   AST 28 15 - 41 U/L   ALT 37 14 - 54 U/L   Alkaline Phosphatase 137 (H) 38 - 126 U/L   Total Bilirubin 0.4 0.3 - 1.2 mg/dL   GFR calc non Af Amer >60 >60 mL/min   GFR calc Af Amer >60 >60 mL/min  Comment: (NOTE) The eGFR has been calculated using the CKD EPI equation. This calculation has not been validated in all clinical situations. eGFR's persistently <60 mL/min signify possible Chronic Kidney Disease.    Anion gap 9 5 - 15  Protime-INR     Status: None   Collection Time: 12/19/16  1:58 PM  Result  Value Ref Range   Prothrombin Time 13.2 11.4 - 15.2 seconds   INR 1.00   Type and screen Order type and screen if day of surgery is less than 15 days from draw of preadmission visit or order morning of surgery if day of surgery is greater than 6 days from preadmission visit.     Status: None   Collection Time: 12/19/16  2:02 PM  Result Value Ref Range   ABO/RH(D) O POS    Antibody Screen NEG    Sample Expiration 01/02/2017    Extend sample reason NO TRANSFUSIONS OR PREGNANCY IN THE PAST 3 MONTHS     Estimated body mass index is 37.62 kg/m as calculated from the following:   Height as of 12/19/16: '5\' 1"'$  (1.549 m).   Weight as of 12/19/16: 90.3 kg (199 lb 1.6 oz).   Imaging Review Plain radiographs demonstrate severe degenerative joint disease of the left knee(s). The overall alignment ismild valgus. The bone quality appears to be fair for age and reported activity level.  Assessment/Plan:  End stage arthritis, left knee   The patient history, physical examination, clinical judgment of the provider and imaging studies are consistent with end stage degenerative joint disease of the left knee(s) and total knee arthroplasty is deemed medically necessary. The treatment options including medical management, injection therapy arthroscopy and arthroplasty were discussed at length. The risks and benefits of total knee arthroplasty were presented and reviewed. The risks due to aseptic loosening, infection, stiffness, patella tracking problems, thromboembolic complications and other imponderables were discussed. The patient acknowledged the explanation, agreed to proceed with the plan and consent was signed. Patient is being admitted for inpatient treatment for surgery, pain control, PT, OT, prophylactic antibiotics, VTE prophylaxis, progressive ambulation and ADL's and discharge planning. The patient is planning to be discharged home with home health services

## 2016-12-30 NOTE — Anesthesia Procedure Notes (Addendum)
Anesthesia Regional Block: Adductor canal block   Pre-Anesthetic Checklist: ,, timeout performed, Correct Patient, Correct Site, Correct Laterality, Correct Procedure, Correct Position, site marked, Risks and benefits discussed,  Surgical consent,  Pre-op evaluation,  At surgeon's request and post-op pain management  Laterality: Left and Lower  Prep: chloraprep       Needles:   Needle Type: Echogenic Stimulator Needle     Needle Length: 9cm  Needle Gauge: 21   Needle insertion depth: 6 cm   Additional Needles:   Procedures: ultrasound guided,,,,,,,,  Narrative:  Start time: 12/30/2016 7:20 AM End time: 12/30/2016 7:15 AM Injection made incrementally with aspirations every 5 mL.  Performed by: Personally  Anesthesiologist: Mouna Yager

## 2016-12-30 NOTE — Op Note (Signed)
NAMEYAKELIN, GRENIER     ACCOUNT NO.:  [id number]  MEDICAL RECORD NO.:  [id number]  LOCATION:                                 FACILITY:  PHYSICIAN:  Alta Corning, M.D.        DATE OF BIRTH:  DATE OF PROCEDURE:  12/30/2016 DATE OF DISCHARGE:                              OPERATIVE REPORT   PREOPERATIVE DIAGNOSIS:  End-stage degenerative joint disease, left knee.  POSTOPERATIVE DIAGNOSIS:  End-stage degenerative joint disease, left knee.  PROCEDURE:  Left total knee replacement with Attune system size 4 narrow femur, size 4 tibia, a 6-mm bridging bearing, and a 35-mm all- polyethylene patella.  SURGEON:  Alta Corning, M.D.  Terrence DupontModena Slater.  ANESTHESIA:  Spinal.  BRIEF HISTORY:  [name] is a 56 year old female with a long history of significant complaints of left knee pain.  She had been treated conservatively for prolonged period of time.  After failure of all conservative care, she was taken to the operating room for left total knee replacement.  She had a previous right total knee replacement and has done well.  She was having night pain and light activity pain, and x-ray showed bone-on-bone change.  DESCRIPTION OF PROCEDURE:  The patient was taken to the operating room. After adequate anesthesia was obtained with spinal anesthetic, the patient was placed supine on the operating table.  Left leg was prepped and draped in usual sterile fashion.  Following this, the leg was exsanguinated.  Blood pressure tourniquet inflated to 300 mmHg. Following this, a midline incision was made in subcutaneous tissue and dissected down to the level of extensor mechanism, medial parapatellar arthrotomy was undertaken.  Following this, medial and lateral meniscus were removed, retropatellar fat pad, synovium on the anterior aspect of the femur, and the anterior and posterior cruciates.  Following this, intramedullary pilot hole was drilled in the femur and a  4-degree valgus inclination guide was used and 9 mm of distal bone was resected. Following this, a 3-degree external rotational guide was placed and the femur sized to a 4.  The anterior and posterior cuts were made, chamfers and box.  Attention was then turned to the tibia, it was cut perpendicular to its long axis, sized to 4.  It was drilled and keeled, and trials were put in place.  Trial would suggest that narrow is going to be a better fit, so we went with that.  Attention went to the patella, was cut down to a level of 13 mm and a 35 poly was chosen. Lugs were drilled for the poly, poly patella was placed and knee put through a range of motion.  Excellent stability and range of motion were achieved.  At this point, gap balance was excellent.  Following this, all the trial components were removed.  Attention was turned back to the to the knee which was copiously and thoroughly irrigated and suctioned dry.  The final components were then cemented into place, size 4 narrow femur, size 4 tibia, 6 mm bridging bearing trial was placed, and 35 all poly patella was placed and held with a clamp.  Once this was completed, attention was turned towards allowing the cement to dry, and all excess bone cement  was removed.  Once this was done, the tourniquet was let down.  All bleeding was controlled with electrocautery.  Final 6 was opened and placed, and final stability was checked, it is excellent, and at this point, the medial parapatellar arthrotomy was closed with 1 Vicryl running, and the skin was closed with 0 and 2-0 Vicryl and 3-0 Monocryl subcuticular.  Benzoin and Steri-Strips were applied.  Sterile compressive dressing was applied, and the patient was taken to the recovery room, and she was noted to be in satisfactory condition.  Estimated blood loss for the procedure is minimal.     Alta Corning, M.D.   ______________________________ Alta Corning, M.D.    Corliss Skains  D:   12/30/2016  T:  12/30/2016  Job:  [id number]  cc:   Alta Corning, M.D.

## 2016-12-30 NOTE — Progress Notes (Signed)
Orthopedic Tech Progress Note Patient Details:  Katherine Tanner 1960-09-21 [id number]  CPM Left Knee CPM Left Knee: On Left Knee Flexion (Degrees): 9 Left Knee Extension (Degrees): 0 Additional Comments: trapeze bar patient helper   Hildred Priest 12/30/2016, 10:15 AM Viewed order from doctor's order list

## 2016-12-30 NOTE — Anesthesia Postprocedure Evaluation (Signed)
Anesthesia Post Note  Patient: Katherine Tanner  Procedure(s) Performed: Procedure(s) (LRB): TOTAL KNEE ARTHROPLASTY (Left)     Patient location during evaluation: PACU Anesthesia Type: Spinal Level of consciousness: oriented and awake and alert Pain management: pain level controlled Vital Signs Assessment: post-procedure vital signs reviewed and stable Respiratory status: spontaneous breathing, respiratory function stable and patient connected to nasal cannula oxygen Cardiovascular status: blood pressure returned to baseline and stable Postop Assessment: no headache and no backache Anesthetic complications: no    Last Vitals:  Vitals:   12/30/16 1018 12/30/16 1038  BP: 133/84 136/84  Pulse: 64 67  Resp: 17 18  Temp: 36.5 C 36.4 C    Last Pain:  Vitals:   12/30/16 1053  TempSrc:   PainSc: 6                  [name],JAMES TERRILL

## 2016-12-30 NOTE — Transfer of Care (Signed)
Immediate Anesthesia Transfer of Care Note  Patient: Katherine Tanner  Procedure(s) Performed: Procedure(s): TOTAL KNEE ARTHROPLASTY (Left)  Patient Location: PACU  Anesthesia Type:Spinal  Level of Consciousness: awake, alert , oriented and patient cooperative  Airway & Oxygen Therapy: Patient Spontanous Breathing and Patient connected to face mask oxygen  Post-op Assessment: Report given to RN and Post -op Vital signs reviewed and stable  Post vital signs: Reviewed and stable  Last Vitals:  Vitals:   12/30/16 0547  BP: 133/63  Pulse: 80  Resp: 20  Temp: 36.9 C    Last Pain:  Vitals:   12/30/16 0547  TempSrc: Oral      Patients Stated Pain Goal: 1 (66/44/03 4742)  Complications: No apparent anesthesia complications

## 2016-12-30 NOTE — Anesthesia Preprocedure Evaluation (Signed)
Anesthesia Evaluation  Patient identified by MRN, date of birth, ID band Patient awake    Reviewed: Allergy & Precautions, NPO status , Patient's Chart, lab work & pertinent test results  History of Anesthesia Complications (+) PONV  Airway Mallampati: II   Neck ROM: Full    Dental no notable dental hx.    Pulmonary asthma ,    breath sounds clear to auscultation       Cardiovascular hypertension,  Rhythm:Regular Rate:Normal     Neuro/Psych    GI/Hepatic Neg liver ROS, hiatal hernia, GERD  ,  Endo/Other  negative endocrine ROS  Renal/GU negative Renal ROS     Musculoskeletal  (+) Arthritis ,   Abdominal   Peds  Hematology negative hematology ROS (+)   Anesthesia Other Findings   Reproductive/Obstetrics                             Anesthesia Physical Anesthesia Plan  ASA: II  Anesthesia Plan: Spinal   Post-op Pain Management:  Regional for Post-op pain   Induction: Intravenous  PONV Risk Score and Plan: 4 or greater and Ondansetron, Dexamethasone, Propofol, Midazolam and Treatment may vary due to age or medical condition  Airway Management Planned: Natural Airway and Simple Face Mask  Additional Equipment:   Intra-op Plan:   Post-operative Plan:   Informed Consent: I have reviewed the patients History and Physical, chart, labs and discussed the procedure including the risks, benefits and alternatives for the proposed anesthesia with the patient or authorized representative who has indicated his/her understanding and acceptance.     Plan Discussed with: CRNA  Anesthesia Plan Comments:         Anesthesia Quick Evaluation

## 2016-12-30 NOTE — Brief Op Note (Signed)
12/30/2016  9:01 AM  PATIENT:  Katherine Tanner  56 y.o. female  PRE-OPERATIVE DIAGNOSIS:  OSTEOARTHRITIS LEFT KNEE  POST-OPERATIVE DIAGNOSIS:  OSTEOARTHRITIS LEFT KNEE  PROCEDURE:  Procedure(s): TOTAL KNEE ARTHROPLASTY (Left)  SURGEON:  Surgeon(s) and Role:    Dorna Leitz, MD - Primary  PHYSICIAN ASSISTANT:   ASSISTANTS: bethune   ANESTHESIA:   spinal  EBL:  Total I/O In: 1000 [I.V.:1000] Out: 120 [Urine:70; Blood:50]  BLOOD ADMINISTERED:none  DRAINS: none   LOCAL MEDICATIONS USED:  MARCAINE    and OTHER experel  SPECIMEN:  No Specimen  DISPOSITION OF SPECIMEN:  N/A  COUNTS:  YES  TOURNIQUET:   Total Tourniquet Time Documented: Thigh (Left) - 53 minutes Total: Thigh (Left) - 53 minutes   DICTATION: .Other Dictation: Dictation Number [id number]  PLAN OF CARE: Admit to inpatient   PATIENT DISPOSITION:  PACU - hemodynamically stable.   Delay start of Pharmacological VTE agent (>24hrs) due to surgical blood loss or risk of bleeding: no

## 2016-12-30 NOTE — Evaluation (Signed)
Physical Therapy Evaluation Patient Details Name: Katherine Tanner MRN: [id number] DOB: July 20, 1960 Today's Date: 12/30/2016   History of Present Illness  Pt is a 56 y/o female s/p L TKA secondary to L knee OA. PMH includes low back pain, HTN, asthma, breast biopsy, cardiac cath, and R TKA.   Clinical Impression  Pt is s/p surgery above with deficits below. PTA, pt was independent with functional mobility and still working in the lab at [employer]. Upon evaluation, pt limited by post op pain and weakness, as well as, decreased balance and wooziness. Pt required min guard to min A for functional mobility this session. Reports she will be getting HHPT at d/c and husband will be able to assist as necessary. Pt has all required DME at home. Will continue to follow acutely to maximize functional mobility independence.     Follow Up Recommendations DC plan and follow up therapy as arranged by surgeon;Supervision/Assistance - 24 hour    Equipment Recommendations  None recommended by PT    Recommendations for Other Services       Precautions / Restrictions Precautions Precautions: Knee Precaution Booklet Issued: Yes (comment) Precaution Comments: Reviewed supine ther ex with pt . Required Braces or Orthoses: Knee Immobilizer - Left Knee Immobilizer - Left: Other (comment) (until discontinued ) Restrictions Weight Bearing Restrictions: Yes LLE Weight Bearing: Weight bearing as tolerated      Mobility  Bed Mobility Overal bed mobility: Needs Assistance Bed Mobility: Supine to Sit     Supine to sit: Min assist     General bed mobility comments: Min A for LLE management. Use of bed rails and elevated HOB   Transfers Overall transfer level: Needs assistance Equipment used: Rolling walker (2 wheeled) Transfers: Sit to/from Stand Sit to Stand: Min assist         General transfer comment: Min A for lift assist and steadying upon standing. Verbal cues for hand placement.    Ambulation/Gait Ambulation/Gait assistance: Min guard Ambulation Distance (Feet): 25 Feet Assistive device: Rolling walker (2 wheeled) Gait Pattern/deviations: Step-to pattern;Decreased weight shift to left;Decreased step length - left;Decreased step length - right;Antalgic Gait velocity: Decreased Gait velocity interpretation: Below normal speed for age/gender General Gait Details: Slow, antalgic gait secondary to post op pain and weakness. Limited tolerance as pt reporting "feeling funny" and getting hot. Verbal cues for sequencing with RW.   Stairs            Wheelchair Mobility    Modified Rankin (Stroke Patients Only)       Balance Overall balance assessment: Needs assistance Sitting-balance support: No upper extremity supported;Feet supported Sitting balance-Leahy Scale: Good     Standing balance support: Bilateral upper extremity supported;During functional activity Standing balance-Leahy Scale: Poor Standing balance comment: Reliant on RW for stability                              Pertinent Vitals/Pain Pain Assessment: 0-10 Pain Score: 7  Pain Location: L knee  Pain Descriptors / Indicators: Aching;Operative site guarding;Sore Pain Intervention(s): Limited activity within patient's tolerance;Monitored during session;Repositioned    Home Living Family/patient expects to be discharged to:: Private residence Living Arrangements: Spouse/significant other Available Help at Discharge: Family;Available 24 hours/day Type of Home: House Home Access: Stairs to enter Entrance Stairs-Rails: Right;Left;Can reach both Entrance Stairs-Number of Steps: 4 Home Layout: One level Home Equipment: Walker - 2 wheels;Bedside commode;Cane - single point;Shower seat - built in  Prior Function Level of Independence: Independent         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 Deficits consistent with post op pain and weakness. Able to perform exercise below.     Cervical / Trunk Assessment Cervical / Trunk Assessment: Normal  Communication   Communication: No difficulties  Cognition Arousal/Alertness: Awake/alert Behavior During Therapy: WFL for tasks assessed/performed Overall Cognitive Status: Within Functional Limits for tasks assessed                                        General Comments General comments (skin integrity, edema, etc.): Pt's husband present during session     Exercises Total Joint Exercises Ankle Circles/Pumps: AROM;Both;10 reps;Supine Quad Sets: AROM;Left;10 reps;Supine Towel Squeeze: AROM;Both;10 reps;Supine Short Arc Quad: AROM;Left;10 reps;Supine Hip ABduction/ADduction: AROM;Left;10 reps;Supine   Assessment/Plan    PT Assessment Patient needs continued PT services  PT Problem List Decreased strength;Decreased range of motion;Decreased activity tolerance;Decreased balance;Decreased mobility;Decreased knowledge of use of DME;Decreased knowledge of precautions;Pain       PT Treatment Interventions DME instruction;Stair training;Gait training;Functional mobility training;Therapeutic activities;Therapeutic exercise;Balance training;Neuromuscular re-education;Patient/family education    PT Goals (Current goals can be found in the Care Plan section)  Acute Rehab PT Goals Patient Stated Goal: to go home  PT Goal Formulation: With patient Time For Goal Achievement: 01/06/17 Potential to Achieve Goals: Good    Frequency 7X/week   Barriers to discharge        Co-evaluation               AM-PAC PT "6 Clicks" Daily Activity  Outcome Measure Difficulty turning over in bed (including adjusting bedclothes,  sheets and blankets)?: A Little Difficulty moving from lying on back to sitting on the side of the bed? : Total Difficulty sitting down on and standing up from a chair with arms (e.g., wheelchair, bedside commode, etc,.)?: Total Help needed moving to and from a bed to chair (including a wheelchair)?: A Little Help needed walking in hospital room?: A Little Help needed climbing 3-5 steps with a railing? : A Little 6 Click Score: 14    End of Session Equipment Utilized During Treatment: Gait belt;Left knee immobilizer Activity Tolerance: Treatment limited secondary to medical complications (Comment) (wooziness ) Patient left: in chair;with call bell/phone within reach Nurse Communication: Mobility status PT Visit Diagnosis: Other abnormalities of gait and mobility (R26.89);Pain Pain - Right/Left: Left Pain - part of body: Knee    Time: 0086-7619 PT Time Calculation (min) (ACUTE ONLY): 28 min   Charges:   PT Evaluation $PT Eval Low Complexity: 1 Procedure PT Treatments $Gait Training: 8-22 mins   PT G Codes:        Leighton Ruff, PT, DPT  Acute Rehabilitation Services  Pager: [phone]   Rudean Hitt 12/30/2016, 5:19 PM

## 2016-12-30 NOTE — Consult Note (Addendum)
   [facility] CM Inpatient Consult   12/30/2016  Katherine Tanner [birth year] [id number]    Came to visit Katherine Tanner on behalf of Link to [facility] Care Management program for [employer] employees/dependents with [employer].  Discussed Link to Wellness program. Denies any Link to Wellness needs.   Discussed post hospital discharge call. Confirmed best contact number as [phone].  Provided [employer], 24-hr line magnet, and contact information.  Appreciative of visit.  Made inpatient RNCM aware of visit.   Katherine Rolling, MSN-Ed, RN,BSN [facility] Liaison [phone]

## 2016-12-31 LAB — BASIC METABOLIC PANEL
Anion gap: 7 (ref 5–15)
BUN: 10 mg/dL (ref 6–20)
CALCIUM: 8.8 mg/dL — AB (ref 8.9–10.3)
CO2: 28 mmol/L (ref 22–32)
Chloride: 102 mmol/L (ref 101–111)
Creatinine, Ser: 0.69 mg/dL (ref 0.44–1.00)
GFR calc Af Amer: >60 mL/min (ref >60)
Glucose, Bld: 225 mg/dL — ABNORMAL HIGH (ref 65–99)
POTASSIUM: 3.5 mmol/L (ref 3.5–5.1)
SODIUM: 137 mmol/L (ref 135–145)

## 2016-12-31 LAB — CBC
HCT: 36.7 % (ref 36.0–46.0)
Hemoglobin: 11.9 g/dL — ABNORMAL LOW (ref 12.0–15.0)
MCH: 28.1 pg (ref 26.0–34.0)
MCHC: 32.4 g/dL (ref 30.0–36.0)
MCV: 86.8 fL (ref 78.0–100.0)
PLATELETS: 205 10*3/uL (ref 150–400)
RBC: 4.23 MIL/uL (ref 3.87–5.11)
RDW: 13.8 % (ref 11.5–15.5)
WBC: 15.1 10*3/uL — AB (ref 4.0–10.5)

## 2016-12-31 MED ORDER — SODIUM CHLORIDE 0.9% FLUSH
3.0000 mL | Freq: Two times a day (BID) | INTRAVENOUS | Status: DC
  Administered 2016-12-31 (×2): 3 mL via INTRAVENOUS

## 2016-12-31 MED ORDER — SODIUM CHLORIDE 0.9% FLUSH: 3.0000 mL | INTRAVENOUS | Status: DC | PRN

## 2016-12-31 NOTE — Progress Notes (Signed)
PATIENT ID: Katherine Tanner  MRN: [id number]  DOB/AGE:  1961-02-02 / 56 y.o.  1 Day Post-Op Procedure(s) (LRB): TOTAL KNEE ARTHROPLASTY (Left)    PROGRESS NOTE Subjective: Patient is alert, oriented, no Nausea, no Vomiting, yes passing gas. Taking PO well. Denies SOB, Chest or Calf Pain. Using Incentive Spirometer, PAS in place. Ambulate WBAT with pt walking 25 ft with  therapy, Patient reports pain as 2/10 .    Objective: Vital signs in last 24 hours: Vitals:   12/30/16 1038 12/30/16 1600 12/30/16 2335 12/31/16 0601  BP: 136/84 129/77 126/65 125/65  Pulse: 67 77 91 80  Resp: 18 18    Temp: 97.6 F (36.4 C) 97 F (36.1 C) 97.9 F (36.6 C) 97.6 F (36.4 C)  TempSrc: Oral Oral Oral Oral  SpO2: 100% 100% 99% 99%  Weight: 90 kg (198 lb 6.6 oz)     Height: 5\' 1"  (1.549 m)         Intake/Output from previous day: I/O last 3 completed shifts: In: 50 [P.O.:760; I.V.:2910; Other:100; IV Piggyback:110] Out: 2670 [Urine:2620; Blood:50]   Intake/Output this shift: No intake/output data recorded.   LABORATORY DATA:  Recent Labs  12/31/16 0400  WBC 15.1*  HGB 11.9*  HCT 36.7  PLT 205  NA 137  K 3.5  CL 102  CO2 28  BUN 10  CREATININE 0.69  GLUCOSE 225*  CALCIUM 8.8*    Examination: Neurologically intact Neurovascular intact Sensation intact distally Intact pulses distally Dorsiflexion/Plantar flexion intact Incision: dressing C/D/I No cellulitis present Compartment soft}  Assessment:   1 Day Post-Op Procedure(s) (LRB): TOTAL KNEE ARTHROPLASTY (Left) ADDITIONAL DIAGNOSIS: Expected Acute Blood Loss Anemia, Hypertension and GERD, Asthma  Plan: PT/OT WBAT, AROM and PROM  DVT Prophylaxis:  SCDx72hrs, ASA 325 mg BID x 2 weeks DISCHARGE PLAN: Home DISCHARGE NEEDS: HHPT, CPM, Walker and 3-in-1 comode seat     PHILLIPS, ERIC R 12/31/2016, 7:43 AM

## 2016-12-31 NOTE — Progress Notes (Signed)
Orthopedic Tech Progress Note Patient Details:  Katherine Tanner 08-13-1960 [id number]  CPM Left Knee CPM Left Knee: On Left Knee Flexion (Degrees): 90 Left Knee Extension (Degrees): 0 Additional Comments: trapeze bar patient helper   [place] Pink 12/31/2016, 4:27 PM

## 2016-12-31 NOTE — Evaluation (Addendum)
Physical Therapy Evaluation Patient Details Name: Katherine Tanner MRN: [id number] DOB: 1960-07-12 Today's Date: 12/31/2016   History of Present Illness  Pt is a 56 y/o female s/p L TKA secondary to L knee OA. PMH includes low back pain, HTN, asthma, breast biopsy, cardiac cath, and R TKA.   Clinical Impression  Pt progressing towards goals. Increased tolerance for ambulation and stair training in afternoon session. Required min guard to supervision for all mobility. Asked if any concerns about mobility upon d/c home, and pt reports none. Educated about assist required at home, and husband will be able to provide. Pt safe to d/c home from mobility standpoint. Will continue to follow acutely to maximize independence with functional mobility.      Follow Up Recommendations DC plan and follow up therapy as arranged by surgeon;Supervision/Assistance - 24 hour    Equipment Recommendations  None recommended by PT    Recommendations for Other Services       Precautions / Restrictions Precautions Precautions: Knee Precaution Booklet Issued: Yes (comment) Precaution Comments: Reviewed supine ther ex Required Braces or Orthoses: Knee Immobilizer - Left Knee Immobilizer - Left: Other (comment) (Until discontinued ) Restrictions Weight Bearing Restrictions: Yes LLE Weight Bearing: Weight bearing as tolerated      Mobility  Bed Mobility Overal bed mobility: Needs Assistance Bed Mobility: Supine to Sit;Sit to Supine     Supine to sit: Min assist Sit to supine: Min assist   General bed mobility comments: Min A for LLE management. Educated about using RLE to hook LLE for ease of movement.   Transfers Overall transfer level: Needs assistance Equipment used: Rolling walker (2 wheeled) Transfers: Sit to/from Stand Sit to Stand: Min guard;Supervision         General transfer comment: Min guard to supervision for safety. Demonstrated safe hand placement.    Ambulation/Gait Ambulation/Gait assistance: Min guard;Supervision Ambulation Distance (Feet): 200 Feet Assistive device: Rolling walker (2 wheeled) Gait Pattern/deviations: Step-to pattern;Decreased weight shift to left;Decreased step length - left;Decreased step length - right;Antalgic;Step-through pattern Gait velocity: Decreased Gait velocity interpretation: Below normal speed for age/gender General Gait Details: Slow, guarded gait. Able to increase ambulation distance and decreased assist to supervision by end of session. Verbal cues for heel strike with LLE and cues to increase step length on RLE.   Stairs Stairs: Yes Stairs assistance: Min guard Stair Management: Two rails;Step to pattern;Forwards Number of Stairs: 4 General stair comments: Min guard for safety. Verbal cues for LE sequencing for ascending/descending stairs.   Wheelchair Mobility    Modified Rankin (Stroke Patients Only)       Balance Overall balance assessment: Needs assistance Sitting-balance support: No upper extremity supported;Feet supported Sitting balance-Leahy Scale: Good     Standing balance support: Bilateral upper extremity supported;No upper extremity supported;During functional activity Standing balance-Leahy Scale: Fair Standing balance comment: Able to maintain static standing at sink to wash hands without UE support                              Pertinent Vitals/Pain Pain Assessment: Faces Pain Score: 5  Faces Pain Scale: Hurts little more Pain Location: L knee  Pain Descriptors / Indicators: Operative site guarding;Sore Pain Intervention(s): Limited activity within patient's tolerance;Monitored during session;Repositioned    Home Living                        Prior Function  Hand Dominance        Extremity/Trunk Assessment                Communication      Cognition Arousal/Alertness: Awake/alert Behavior During Therapy:  WFL for tasks assessed/performed Overall Cognitive Status: Within Functional Limits for tasks assessed                                        General Comments General comments (skin integrity, edema, etc.): Pt with improved tolerance from morning session. Husband present throughout session.     Exercises Total Joint Exercises Ankle Circles/Pumps: AROM;Both;Supine;20 reps Quad Sets: AROM;Left;10 reps;Supine Towel Squeeze: AROM;Both;10 reps;Supine Short Arc Quad: AROM;Left;10 reps;Supine Heel Slides: AROM;Left;10 reps;Supine Hip ABduction/ADduction: AROM;Left;10 reps;Supine   Assessment/Plan    PT Assessment    PT Problem List         PT Treatment Interventions      PT Goals (Current goals can be found in the Care Plan section)  Acute Rehab PT Goals Patient Stated Goal: to go home  PT Goal Formulation: With patient Time For Goal Achievement: 01/06/17 Potential to Achieve Goals: Good    Frequency 7X/week   Barriers to discharge        Co-evaluation               AM-PAC PT "6 Clicks" Daily Activity  Outcome Measure Difficulty turning over in bed (including adjusting bedclothes, sheets and blankets)?: A Little Difficulty moving from lying on back to sitting on the side of the bed? : Total Difficulty sitting down on and standing up from a chair with arms (e.g., wheelchair, bedside commode, etc,.)?: A Little Help needed moving to and from a bed to chair (including a wheelchair)?: A Little Help needed walking in hospital room?: A Little Help needed climbing 3-5 steps with a railing? : A Little 6 Click Score: 16    End of Session Equipment Utilized During Treatment: Gait belt;Left knee immobilizer Activity Tolerance: Patient tolerated treatment well Patient left: in bed;with call bell/phone within reach;with family/visitor present Nurse Communication: Mobility status PT Visit Diagnosis: Other abnormalities of gait and mobility (R26.89);Pain Pain -  Right/Left: Left Pain - part of body: Knee    Time: 1343-1415 PT Time Calculation (min) (ACUTE ONLY): 32 min   Charges:     PT Treatments $Gait Training: 23-37 mins $Therapeutic Exercise: 8-22 mins   PT G Codes:        Leighton Ruff, PT, DPT  Acute Rehabilitation Services  Pager: [phone]   Rudean Hitt 12/31/2016, 3:06 PM

## 2016-12-31 NOTE — Progress Notes (Signed)
Physical Therapy Treatment Patient Details Name: Katherine Tanner MRN: [id number] DOB: 1960/11/05 Today's Date: 12/31/2016    History of Present Illness Pt is a 56 y/o female s/p L TKA secondary to L knee OA. PMH includes low back pain, HTN, asthma, breast biopsy, cardiac cath, and R TKA.     PT Comments    Pt tolerance limited this session secondary to episode of nausea/vomiting prior to entry. Pt wanting to participate, however, so reviewed supine there ex with pt. Will need to see in afternoon in order to progress mobility and attempt stair training. Will continue to follow and progress mobility according to pt tolerance.     Follow Up Recommendations  DC plan and follow up therapy as arranged by surgeon;Supervision/Assistance - 24 hour     Equipment Recommendations  None recommended by PT    Recommendations for Other Services       Precautions / Restrictions Precautions Precautions: Knee Precaution Booklet Issued: Yes (comment) Precaution Comments: Reviewed supine ther ex Required Braces or Orthoses: Knee Immobilizer - Left Knee Immobilizer - Left: Other (comment) (until discontinued ) Restrictions Weight Bearing Restrictions: Yes LLE Weight Bearing: Weight bearing as tolerated    Mobility  Bed Mobility                  Transfers                    Ambulation/Gait                 Stairs            Wheelchair Mobility    Modified Rankin (Stroke Patients Only)       Balance                                            Cognition Arousal/Alertness: Awake/alert Behavior During Therapy: WFL for tasks assessed/performed Overall Cognitive Status: Within Functional Limits for tasks assessed                                        Exercises Total Joint Exercises Ankle Circles/Pumps: AROM;Both;Supine;20 reps Quad Sets: AROM;Left;10 reps;Supine Towel Squeeze: AROM;Both;10 reps;Supine Short Arc  Quad: AROM;Left;10 reps;Supine Heel Slides: AROM;Left;10 reps;Supine Hip ABduction/ADduction: AROM;Left;10 reps;Supine    General Comments General comments (skin integrity, edema, etc.): Limited tolerance this session as pt reporting episode of vomiting prior to entry. Wanted to do exercises however, so reviewed supine ther ex.       Pertinent Vitals/Pain Pain Assessment: 0-10 Pain Score: 5  Pain Location: L knee  Pain Descriptors / Indicators: Operative site guarding;Sore Pain Intervention(s): Limited activity within patient's tolerance;Monitored during session;Repositioned    Home Living                      Prior Function            PT Goals (current goals can now be found in the care plan section) Acute Rehab PT Goals Patient Stated Goal: to go home  PT Goal Formulation: With patient Time For Goal Achievement: 01/06/17 Potential to Achieve Goals: Good Progress towards PT goals: PT to reassess next treatment    Frequency    7X/week      PT Plan Current plan remains appropriate  Co-evaluation              AM-PAC PT "6 Clicks" Daily Activity  Outcome Measure  Difficulty turning over in bed (including adjusting bedclothes, sheets and blankets)?: A Little Difficulty moving from lying on back to sitting on the side of the bed? : Total Difficulty sitting down on and standing up from a chair with arms (e.g., wheelchair, bedside commode, etc,.)?: Total Help needed moving to and from a bed to chair (including a wheelchair)?: A Little Help needed walking in hospital room?: A Little Help needed climbing 3-5 steps with a railing? : A Little 6 Click Score: 14    End of Session Equipment Utilized During Treatment: Gait belt Activity Tolerance: Treatment limited secondary to medical complications (Comment) (nausea/vomiting ) Patient left: in bed;with call bell/phone within reach Nurse Communication: Mobility status PT Visit Diagnosis: Other abnormalities  of gait and mobility (R26.89);Pain Pain - Right/Left: Left Pain - part of body: Knee     Time: 9774-1423 PT Time Calculation (min) (ACUTE ONLY): 18 min  Charges:  $Therapeutic Exercise: 8-22 mins                    G Codes:       Katherine Tanner, PT, DPT  Acute Rehabilitation Services  Pager: [phone]    Katherine Tanner 12/31/2016, 12:31 PM

## 2017-01-01 LAB — CBC
HCT: 33.6 % — ABNORMAL LOW (ref 36.0–46.0)
Hemoglobin: 10.9 g/dL — ABNORMAL LOW (ref 12.0–15.0)
MCH: 28 pg (ref 26.0–34.0)
MCHC: 32.4 g/dL (ref 30.0–36.0)
MCV: 86.4 fL (ref 78.0–100.0)
PLATELETS: 233 10*3/uL (ref 150–400)
RBC: 3.89 MIL/uL (ref 3.87–5.11)
RDW: 14 % (ref 11.5–15.5)
WBC: 17 10*3/uL — ABNORMAL HIGH (ref 4.0–10.5)

## 2017-01-01 NOTE — Discharge Instructions (Addendum)

## 2017-01-01 NOTE — Progress Notes (Signed)
PATIENT ID: Katherine Tanner  MRN: [id number]  DOB/AGE:  1960-12-15 / 56 y.o.  2 Days Post-Op Procedure(s) (LRB): TOTAL KNEE ARTHROPLASTY (Left)    PROGRESS NOTE Subjective: Patient is alert, oriented, no Nausea, no  Vomiting, yes passing gas. Taking PO well.  Did have Nausea yesterday AM with pain med. Denies SOB, Chest or Calf Pain. Using Incentive Spirometer, PAS in place. Ambulate WBAT, Patient reports pain as mild to moderate .    Objective: Vital signs in last 24 hours: Vitals:   12/30/16 2335 12/31/16 0601 12/31/16 2057 01/01/17 0626  BP: 126/65 125/65 (!) 143/66 122/71  Pulse: 91 80 94 73  Resp:   18 18  Temp: 97.9 F (36.6 C) 97.6 F (36.4 C) 98.8 F (37.1 C)   TempSrc: Oral Oral Oral Oral  SpO2: 99% 99% 97% 100%  Weight:      Height:          Intake/Output from previous day: I/O last 3 completed shifts: In: 2320 [P.O.:240; I.V.:2080] Out: 850 [Urine:850]   Intake/Output this shift: No intake/output data recorded.   LABORATORY DATA:  Recent Labs  12/31/16 0400 01/01/17 0356  WBC 15.1* 17.0*  HGB 11.9* 10.9*  HCT 36.7 33.6*  PLT 205 233  NA 137  --   K 3.5  --   CL 102  --   CO2 28  --   BUN 10  --   CREATININE 0.69  --   GLUCOSE 225*  --   CALCIUM 8.8*  --     Examination: Neurologically intact Neurovascular intact Sensation intact distally Intact pulses distally Dorsiflexion/Plantar flexion intact Incision: dressing C/D/I and no drainage No cellulitis present Compartment soft}  Assessment:   2 Days Post-Op Procedure(s) (LRB): TOTAL KNEE ARTHROPLASTY (Left) ADDITIONAL DIAGNOSIS: Expected Acute Blood Loss Anemia, Hypertension  Plan: PT/OT WBAT, AROM and PROM  DVT Prophylaxis:  SCDx72hrs, ASA 325 mg BID x 2 weeks DISCHARGE PLAN: Home DISCHARGE NEEDS: HHPT, Walker and 3-in-1 comode seat     Jerard Bays R 01/01/2017, 7:44 AM

## 2017-01-01 NOTE — Care Management Note (Signed)
Case Management Note  Patient Details  Name: Katherine Tanner MRN: [id number] Date of Birth: February 06, 1961  Subjective/Objective:                 Spoke with patient at the bedside. She states HH has been arranged through [facility], verified, referral also placed to [facility] clinical liaison. Patient staes she has DME and CPM will be delivered tomorrow. No further CM needs.    Action/Plan:  DC to home w [place].  Expected Discharge Date:  01/01/17               Expected Discharge Plan:  [place]  In-House Referral:     Discharge planning Services  CM Consult  Post Acute Care Choice:  Home Health Choice offered to:  Patient  DME Arranged:    DME Agency:     HH Arranged:  PT [place]:  [place]  Status of Service:  Completed, signed off  If discussed at [place] of Stay Meetings, dates discussed:    Additional Comments:  Carles Collet, RN 01/01/2017, 9:26 AM

## 2017-01-01 NOTE — Discharge Summary (Signed)
Patient ID: Katherine Tanner MRN: [id number] DOB/AGE: 09/08/1960 56 y.o.  Admit date: 12/30/2016 Discharge date: 01/01/2017  Admission Diagnoses:  Active Problems:   Primary osteoarthritis of left knee   Discharge Diagnoses:  Same  Past Medical History:  Diagnosis Date  . Allergy    takes Claritin daily and Flonase daily  . Arthritis   . Asthma    uses Albuterol daily as needed;Singulair nightly;DUlera daily  . GERD (gastroesophageal reflux disease)    occasionally will take OTC meds but states she just watches what she eats  . H/O hiatal hernia   . History of bronchitis    last time 56yrs ago  . History of colon polyps   . History of kidney stones   . History of shingles   . Hypertension    takes Hyzaar daily  . Joint pain   . Joint swelling   . Pneumonia    hx of;last time at least 56yrs ago  . PONV (postoperative nausea and vomiting)     Surgeries: Procedure(s): TOTAL KNEE ARTHROPLASTY on 12/30/2016   Consultants:   Discharged Condition: Improved  Hospital Course: Katherine Tanner is an 56 y.o. female who was admitted 12/30/2016 for operative treatment of<principal problem not specified>. Patient has severe unremitting pain that affects sleep, daily activities, and work/hobbies. After pre-op clearance the patient was taken to the operating room on 12/30/2016 and underwent  Procedure(s): TOTAL KNEE ARTHROPLASTY.    Patient was given perioperative antibiotics: Anti-infectives    Start     Dose/Rate Route Frequency Ordered Stop   12/30/16 1300  clindamycin (CLEOCIN) IVPB 600 mg     600 mg 100 mL/hr over 30 Minutes Intravenous Every 6 hours 12/30/16 1036 12/30/16 1834   12/30/16 0900  clindamycin (CLEOCIN) IVPB 900 mg     900 mg 100 mL/hr over 30 Minutes Intravenous To ShortStay Surgical 12/29/16 1014 12/30/16 0804       Patient was given sequential compression devices, early ambulation, and chemoprophylaxis to prevent DVT.  Patient benefited  maximally from hospital stay and there were no complications.    Recent vital signs: Patient Vitals for the past 24 hrs:  BP Temp Temp src Pulse Resp SpO2  01/01/17 0626 122/71 - Oral 73 18 100 %  12/31/16 2057 (!) 143/66 98.8 F (37.1 C) Oral 94 18 97 %     Recent laboratory studies:  Recent Labs  12/31/16 0400 01/01/17 0356  WBC 15.1* 17.0*  HGB 11.9* 10.9*  HCT 36.7 33.6*  PLT 205 233  NA 137  --   K 3.5  --   CL 102  --   CO2 28  --   BUN 10  --   CREATININE 0.69  --   GLUCOSE 225*  --   CALCIUM 8.8*  --      Discharge Medications:   Allergies as of 01/01/2017      Reactions   Ace Inhibitors Shortness Of Breath, Cough   Cough/ breathing problems    Amoxicillin-pot Clavulanate Swelling   SWELLING REACTION UNSPECIFIED    Influenza Virus Vacc Split Pf    UNSPECIFIED REACTION    Shellfish Allergy Itching      Medication List    STOP taking these medications   cyclobenzaprine 10 MG tablet Commonly known as:  FLEXERIL   EPINEPHrine 0.3 mg/0.3 mL Soaj injection Commonly known as:  EPI-PEN   HYDROcodone-acetaminophen 5-325 MG tablet Commonly known as:  NORCO/VICODIN   meloxicam 15 MG tablet Commonly known as:  MOBIC  traMADol 50 MG tablet Commonly known as:  ULTRAM     TAKE these medications   albuterol 108 (90 Base) MCG/ACT inhaler Commonly known as:  PROVENTIL HFA;VENTOLIN HFA Inhale 2 puffs into the lungs every 6 (six) hours as needed for wheezing or shortness of breath.   aspirin EC 325 MG tablet Take 1 tablet (325 mg total) by mouth 2 (two) times daily after a meal. Take x 1 month post op to decrease risk of blood clots.   BREO ELLIPTA 100-25 MCG/INH Aepb Generic drug:  fluticasone furoate-vilanterol INHALE 1 PUFF INTO THE LUNGS DAILY. RINSE MOUTH.   CALCIUM + D PO Take 1 tablet by mouth daily.   Compressor/Nebulizer Misc Use as directed   docusate sodium 100 MG capsule Commonly known as:  COLACE Take 1 capsule (100 mg total) by mouth 2  (two) times daily.   fluticasone 50 MCG/ACT nasal spray Commonly known as:  FLONASE Place 2 sprays into both nostrils daily.   guaiFENesin 600 MG 12 hr tablet Commonly known as:  MUCINEX Take 600 mg by mouth 2 (two) times daily as needed.   ipratropium-albuterol 0.5-2.5 (3) MG/3ML Soln Commonly known as:  DUONEB Take 3 mLs by nebulization every 6 (six) hours as needed.   loratadine 10 MG tablet Commonly known as:  CLARITIN Take 10 mg by mouth daily.   losartan-hydrochlorothiazide 100-25 MG tablet Commonly known as:  HYZAAR Take 1 tablet by mouth daily.   methocarbamol 750 MG tablet Commonly known as:  ROBAXIN-750 Take 1 tablet (750 mg total) by mouth every 8 (eight) hours as needed for muscle spasms.   montelukast 10 MG tablet Commonly known as:  SINGULAIR Take 1 tablet (10 mg total) by mouth at bedtime.   MULTIVITAMIN PO Take 1 tablet by mouth daily.   oxyCODONE-acetaminophen 5-325 MG tablet Commonly known as:  PERCOCET/ROXICET Take 1-2 tablets by mouth every 4 (four) hours as needed for severe pain.       Diagnostic Studies: Dg Chest 2 View  Result Date: 12/19/2016 CLINICAL DATA:  Preoperative examination prior to left knee replacement nonsmoker. History of asthma. No current complaints. EXAM: CHEST  2 VIEW COMPARISON:  PA and lateral chest x-ray of Nov 07, 2013. FINDINGS: The lungs are adequately inflated. There is no focal infiltrate. There is no pleural effusion. The heart and pulmonary vascularity are normal. The mediastinum is normal in width. There is mild multilevel degenerative disc disease of the thoracic spine. IMPRESSION: There is no active cardiopulmonary disease. Electronically Signed   By: David  Martinique M.D.   On: 12/19/2016 14:59    Disposition: 01-Home or Self Care  Discharge Instructions    AMB Referral to [facility] Management    Complete by:  As directed    Please assign [employer] member for post discharge call. Currently at [facility]. Thanks. Marthenia Rolling, [place], [facility] [phone]   Reason for consult:  Please assign UMR member for post discharge call   Expected date of contact:  1-3 days (reserved for hospital discharges)   CPM    Complete by:  As directed    Continuous passive motion machine (CPM):      Use the CPM from 0 to 60  for 5 hours per day.      You may increase by 10 degrees per day.  You may break it up into 2 or 3 sessions per day.      Use CPM for 2 weeks or until you are told  to stop.   Call MD / Call 911    Complete by:  As directed    If you experience chest pain or shortness of breath, CALL 911 and be transported to the hospital emergency room.  If you develope a fever above 101 F, pus (white drainage) or increased drainage or redness at the wound, or calf pain, call your surgeon's office.   Constipation Prevention    Complete by:  As directed    Drink plenty of fluids.  Prune juice may be helpful.  You may use a stool softener, such as Colace (over the counter) 100 mg twice a day.  Use MiraLax (over the counter) for constipation as needed.   Diet - low sodium heart healthy    Complete by:  As directed    Driving restrictions    Complete by:  As directed    No driving for 2 weeks   Increase activity slowly as tolerated    Complete by:  As directed    Patient may shower    Complete by:  As directed    You may shower without a dressing once there is no drainage.  Do not wash over the wound.  If drainage remains, cover wound with plastic wrap and then shower.      Follow-up Information    Dorna Leitz, MD. Schedule an appointment as soon as possible for a visit in 2 week(s).   Specialty:  Orthopedic Surgery Contact information: [place] [address] [phone]            Signed: Theodosia Quay 01/01/2017, 7:47 AM

## 2017-01-01 NOTE — Progress Notes (Signed)
Physical Therapy Treatment Patient Details Name: Katherine Tanner MRN: [id number] DOB: [birth year] Today's Date: 01/01/2017    History of Present Illness Pt is a 55 y/o female s/p L TKA secondary to L knee OA. PMH includes low back pain, HTN, asthma, breast biopsy, cardiac cath, and R TKA.     PT Comments    Pt performed gait and reviewed HEP in prep for d/c home.  Pt will continue to benefit from return home with f/u HHPT.  Informed nursing that patient is ready for d/c home.     Follow Up Recommendations  DC plan and follow up therapy as arranged by surgeon;Supervision/Assistance - 24 hour     Equipment Recommendations  None recommended by PT    Recommendations for Other Services       Precautions / Restrictions Precautions Precautions: Knee Precaution Booklet Issued: Yes (comment) Precaution Comments: Reviewed supine ther ex Required Braces or Orthoses: Knee Immobilizer - Left Knee Immobilizer - Left: Other (comment) (until discontinued.  ) Restrictions Weight Bearing Restrictions: No LLE Weight Bearing: Weight bearing as tolerated    Mobility  Bed Mobility               General bed mobility comments: Pt sitting on edge of bed on arrival.    Transfers Overall transfer level: Needs assistance Equipment used: Rolling walker (2 wheeled) Transfers: Sit to/from Stand Sit to Stand: Supervision         General transfer comment: Min guard to supervision for safety. Demonstrated safe hand placement. Performed with out brace with better carryover.    Ambulation/Gait Ambulation/Gait assistance: Min guard;Supervision Ambulation Distance (Feet): 250 Feet Assistive device: Rolling walker (2 wheeled) Gait Pattern/deviations: Decreased weight shift to left;Decreased step length - left;Decreased step length - right;Antalgic;Step-through pattern Gait velocity: Decreased   General Gait Details: Pt performed gait without knee immobilizer.  Pt required cues for L knee  extension and heel strike in stance phase.  Increased pain with knee extension.     Stairs            Wheelchair Mobility    Modified Rankin (Stroke Patients Only)       Balance Overall balance assessment: Needs assistance Sitting-balance support: No upper extremity supported;Feet supported Sitting balance-Leahy Scale: Good       Standing balance-Leahy Scale: Fair                              Cognition Arousal/Alertness: Awake/alert Behavior During Therapy: WFL for tasks assessed/performed Overall Cognitive Status: Within Functional Limits for tasks assessed                                        Exercises Total Joint Exercises Ankle Circles/Pumps: AROM;Both;Supine;20 reps Quad Sets: AROM;Left;10 reps;Supine Towel Squeeze: AROM;Both;10 reps;Supine Short Arc Quad: AROM;Left;10 reps;Supine Heel Slides: AROM;Left;10 reps;Supine Hip ABduction/ADduction: AROM;Left;10 reps;Supine Straight Leg Raises: AROM;Left;10 reps;Supine Goniometric ROM: 72 degrees flexion in L knee.      General Comments        Pertinent Vitals/Pain Pain Assessment: Faces Pain Score: 5  Pain Location: L knee  Pain Descriptors / Indicators: Operative site guarding;Sore Pain Intervention(s): Monitored during session;Repositioned;Limited activity within patient's tolerance    Home Living Family/patient expects to be discharged to:: Private residence Living Arrangements: Spouse/significant other  Prior Function            PT Goals (current goals can now be found in the care plan section) Acute Rehab PT Goals Patient Stated Goal: to go home  Potential to Achieve Goals: Good Progress towards PT goals: Progressing toward goals    Frequency    7X/week      PT Plan Current plan remains appropriate    Co-evaluation              AM-PAC PT "6 Clicks" Daily Activity  Outcome Measure  Difficulty turning over in bed  (including adjusting bedclothes, sheets and blankets)?: A Little Difficulty moving from lying on back to sitting on the side of the bed? : A Little Difficulty sitting down on and standing up from a chair with arms (e.g., wheelchair, bedside commode, etc,.)?: A Little Help needed moving to and from a bed to chair (including a wheelchair)?: A Little Help needed walking in hospital room?: A Little Help needed climbing 3-5 steps with a railing? : A Little 6 Click Score: 18    End of Session Equipment Utilized During Treatment: Gait belt Activity Tolerance: Patient tolerated treatment well Patient left: in bed;with call bell/phone within reach;with family/visitor present Nurse Communication: Mobility status PT Visit Diagnosis: Other abnormalities of gait and mobility (R26.89);Pain Pain - Right/Left: Left Pain - part of body: Knee     Time: 8592-9244 PT Time Calculation (min) (ACUTE ONLY): 31 min  Charges:  $Gait Training: 8-22 mins $Therapeutic Exercise: 8-22 mins                    G Codes:       Governor Rooks, PTA pager [phone]    Cristela Blue 01/01/2017, 1:21 PM

## 2017-01-02 ENCOUNTER — Encounter: Payer: Self-pay | Admitting: *Deleted

## 2017-01-02 ENCOUNTER — Other Ambulatory Visit: Payer: Self-pay | Admitting: *Deleted

## 2017-01-02 ENCOUNTER — Encounter (HOSPITAL_COMMUNITY): Payer: Self-pay | Admitting: Orthopedic Surgery

## 2017-01-02 DIAGNOSIS — Z96652 Presence of left artificial knee joint: Secondary | ICD-10-CM | POA: Diagnosis not present

## 2017-01-02 DIAGNOSIS — M1712 Unilateral primary osteoarthritis, left knee: Secondary | ICD-10-CM | POA: Diagnosis not present

## 2017-01-02 NOTE — Patient Outreach (Signed)
[facility]) Care Management  01/02/2017  Katherine Tanner [birth year] [id number]   Subjective: Telephone call to patient's home  number, spoke with patient, and HIPAA verified.  Discussed [facility] Care Management UMR Transition of care follow up, patient voiced understanding, and is in agreement to follow up.   Patient states she is doing well, has a follow up appointment scheduled with surgeon on 01/10/17, [facility] has delivered equipment to her home today, and she will follow up with them today regarding start of care for home health physical therapy.  Patient verbally given contact number for [place] [phone]).  States she has hypertension, has asthma, and is able to self manage with medications.  States she is accessing the following [facility] benefits: outpatient pharmacy, hospital indemnity supplemental insurance, and family medical leave act [employer]) has been approved.  States she will call patient accounting to request itemized bill and file hospital indemnity claim.  Patient states she does not have any education material, transition of care, care coordination, disease management, disease monitoring, transportation, community resource, or pharmacy needs at this time. States she is very appreciative of the follow up and is in agreement to receive [facility] Management information.    Objective: Per KPN (point of care tool) and chart review, patient hospitalized 12/30/16 -01/01/17 for Primary osteoarthritis of left knee and End-stage degenerative joint disease, left knee.   Status post Left total knee replacement with Attune system size 4 narrow femur, size 4 tibia, a 6-mm bridging bearing, and a 35-mm all-polyethylene patella on 12/30/16.  [facility] Care Management UMR preoperative call completed on 12/27/16.    Assessment: Received UMR Transition of care referral on 01/02/17.  Transition of care follow up completed, no care management needs, and will proceed with case  closure.    Plan: RNCM will send patient successful outreach letter, [facility] pamphlet, and magnet. RNCM will send case closure due to follow up completed / no care management needs request to [facility].    Katherine Tanner, BSN, [facility] Management [facility] Telephonic CM Phone: [phone] Fax: [phone]

## 2017-01-03 DIAGNOSIS — J45909 Unspecified asthma, uncomplicated: Secondary | ICD-10-CM | POA: Diagnosis not present

## 2017-01-03 DIAGNOSIS — I10 Essential (primary) hypertension: Secondary | ICD-10-CM | POA: Diagnosis not present

## 2017-01-03 DIAGNOSIS — Z471 Aftercare following joint replacement surgery: Secondary | ICD-10-CM | POA: Diagnosis not present

## 2017-01-03 DIAGNOSIS — Z96652 Presence of left artificial knee joint: Secondary | ICD-10-CM | POA: Diagnosis not present

## 2017-01-06 DIAGNOSIS — Z96652 Presence of left artificial knee joint: Secondary | ICD-10-CM | POA: Diagnosis not present

## 2017-01-06 DIAGNOSIS — Z471 Aftercare following joint replacement surgery: Secondary | ICD-10-CM | POA: Diagnosis not present

## 2017-01-06 DIAGNOSIS — I10 Essential (primary) hypertension: Secondary | ICD-10-CM | POA: Diagnosis not present

## 2017-01-06 DIAGNOSIS — J45909 Unspecified asthma, uncomplicated: Secondary | ICD-10-CM | POA: Diagnosis not present

## 2017-01-10 DIAGNOSIS — I10 Essential (primary) hypertension: Secondary | ICD-10-CM | POA: Diagnosis not present

## 2017-01-10 DIAGNOSIS — Z471 Aftercare following joint replacement surgery: Secondary | ICD-10-CM | POA: Diagnosis not present

## 2017-01-10 DIAGNOSIS — J45909 Unspecified asthma, uncomplicated: Secondary | ICD-10-CM | POA: Diagnosis not present

## 2017-01-10 DIAGNOSIS — Z96652 Presence of left artificial knee joint: Secondary | ICD-10-CM | POA: Diagnosis not present

## 2017-01-12 DIAGNOSIS — Z471 Aftercare following joint replacement surgery: Secondary | ICD-10-CM | POA: Diagnosis not present

## 2017-01-12 DIAGNOSIS — Z96652 Presence of left artificial knee joint: Secondary | ICD-10-CM | POA: Diagnosis not present

## 2017-01-12 DIAGNOSIS — J45909 Unspecified asthma, uncomplicated: Secondary | ICD-10-CM | POA: Diagnosis not present

## 2017-01-12 DIAGNOSIS — I10 Essential (primary) hypertension: Secondary | ICD-10-CM | POA: Diagnosis not present

## 2017-01-16 DIAGNOSIS — M1712 Unilateral primary osteoarthritis, left knee: Secondary | ICD-10-CM | POA: Diagnosis not present

## 2017-01-16 MED FILL — ZOLPIDEM TARTRATE 10 MG TAB: 10 | 20 days supply | Qty: 20 | Fill #0

## 2017-01-18 ENCOUNTER — Ambulatory Visit: Payer: 59 | Attending: Orthopedic Surgery | Admitting: Physical Therapy

## 2017-01-18 ENCOUNTER — Encounter: Payer: Self-pay | Admitting: Physical Therapy

## 2017-01-18 DIAGNOSIS — M25662 Stiffness of left knee, not elsewhere classified: Secondary | ICD-10-CM | POA: Insufficient documentation

## 2017-01-18 DIAGNOSIS — R2689 Other abnormalities of gait and mobility: Secondary | ICD-10-CM | POA: Diagnosis not present

## 2017-01-18 DIAGNOSIS — M25562 Pain in left knee: Secondary | ICD-10-CM | POA: Diagnosis not present

## 2017-01-18 DIAGNOSIS — M6281 Muscle weakness (generalized): Secondary | ICD-10-CM | POA: Diagnosis not present

## 2017-01-18 DIAGNOSIS — G8929 Other chronic pain: Secondary | ICD-10-CM | POA: Diagnosis not present

## 2017-01-18 DIAGNOSIS — R600 Localized edema: Secondary | ICD-10-CM | POA: Diagnosis not present

## 2017-01-18 NOTE — Therapy (Signed)
[place], [place], [address] Phone: [phone]   Fax:  [phone]  Physical Therapy Evaluation  Patient Details  Name: Katherine Tanner MRN: [id number] Date of Birth: January 08, 1961 Referring Provider: Lowella Petties MD  Encounter Date: 01/18/2017      PT End of Session - 01/18/17 1539    Visit Number 1   Number of Visits 17   Date for PT Re-Evaluation 03/15/17   PT Start Time 1450   PT Stop Time 1542   PT Time Calculation (min) 52 min   Activity Tolerance Patient tolerated treatment well   Behavior During Therapy [facility] for tasks assessed/performed      Past Medical History:  Diagnosis Date  . Allergy    takes Claritin daily and Flonase daily  . Arthritis   . Asthma    uses Albuterol daily as needed;Singulair nightly;DUlera daily  . GERD (gastroesophageal reflux disease)    occasionally will take OTC meds but states she just watches what she eats  . H/O hiatal hernia   . History of bronchitis    last time 98yrs ago  . History of colon polyps   . History of kidney stones   . History of shingles   . Hypertension    takes Hyzaar daily  . Joint pain   . Joint swelling   . Pneumonia    hx of;last time at least 71yrs ago  . PONV (postoperative nausea and vomiting)     Past Surgical History:  Procedure Laterality Date  . BREAST EXCISIONAL BIOPSY Right   . CARDIAC CATHETERIZATION    . COLONOSCOPY    . ESOPHAGOGASTRODUODENOSCOPY    . KNEE SURGERY Right    arthroscopy  . Right ganglion cyst     x 2  . right lumpectomy  around 1983  . STERIOD INJECTION Left 11/15/2013   Procedure: Marcaine/STEROID INJECTION;  Surgeon: Alta Corning, MD;  Location: [place];  Service: Orthopedics;  Laterality: Left;  . TOTAL KNEE ARTHROPLASTY Right 11/15/2013   Procedure: RIGHT TOTAL KNEE ARTHROPLASTY;  Surgeon: Alta Corning, MD;  Location: [place];  Service: Orthopedics;  Laterality: Right;  . TOTAL KNEE  ARTHROPLASTY Left 12/30/2016   Procedure: TOTAL KNEE ARTHROPLASTY;  Surgeon: Dorna Leitz, MD;  Location: [place];  Service: Orthopedics;  Laterality: Left;  . TUBAL LIGATION      There were no vitals filed for this visit.       Subjective Assessment - 01/18/17 1457    Subjective pt is a 56 y.o F s/p L TKA on 12/30/2016. Since surgery still have pain and stiffness which has improved. Currently uses a [facility] for ambulation. Currenlty doing exercise that were given from [place] for about 2 weeks.    How long can you sit comfortably? 1 hour   How long can you stand comfortably? 30 - 60 min SPC   How long can you walk comfortably? 30 -60 min SPC   Diagnostic tests X-ray   Patient Stated Goals Get rid of the cane, return to driving, stand for long period time (job), return to the gym   Currently in Pain? Yes   Pain Score 3   last took medication at 11:30, at worst pain gets to 6/10   Pain Location Knee   Pain Orientation Left   Pain Descriptors / Indicators Sore;Tightness   Pain Type Surgical pain   Pain Frequency Intermittent   Aggravating Factors  activity, prolonged standing/ walking, deep knee bending,  Pain Relieving Factors medication, ice, elevation            [facility] PT Assessment - 01/18/17 1443      Assessment   Medical Diagnosis S/P L TKA   Referring Provider Lowella Petties MD   Onset Date/Surgical Date 12/30/16   Hand Dominance Right   Next MD Visit 02/13/2017   Prior Therapy yes   previous TKA     Precautions   Precautions None     Restrictions   Weight Bearing Restrictions No     Balance Screen   Has the patient fallen in the past 6 months No   Has the patient had a decrease in activity level because of a fear of falling?  No   Is the patient reluctant to leave their home because of a fear of falling?  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, some lifting/ carrying   Leisure go to the [employer]   Overall Cognitive Status Within Functional Limits for tasks assessed     Observation/Other Assessments   Focus on Therapeutic Outcomes (FOTO)  65% limited  predicted 43% limited     Observation/Other Assessments-Edema    Edema Circumferential     Circumferential Edema   Circumferential - Left  10 cm above= 51cm, @ joint line =43.3cm , 10 CM below =39cm      Posture/Postural Control   Posture/Postural Control Postural limitations     ROM / Strength   AROM / PROM / Strength AROM;PROM;Strength     AROM   AROM Assessment Site Knee   Right/Left Knee Right;Left   Right Knee Extension -3   Right Knee Flexion 116   Left Knee Extension -21   Left Knee Flexion 103     PROM   PROM Assessment Site Knee   Right/Left Knee Left   Left Knee Extension -16   Left Knee Flexion 113     Strength   Strength Assessment Site Knee   Right/Left Knee Right;Left   Right Knee Flexion 5/5   Right Knee Extension 5/5   Left Knee Flexion 3+/5  soreness during testing   Left Knee Extension 4-/5  pain during testing     Palpation   Patella mobility hypomobility of the patella compared bil in all planes   Palpation comment TTP along medail joint line and distal quad. tenderness around the incision     Ambulation/Gait   Ambulation/Gait Yes   Assistive device Straight cane   Gait Pattern Step-through pattern;Decreased stride length;Trendelenburg;Antalgic;Decreased trunk rotation;Trunk flexed            Objective  measurements completed on examination: See above findings.          [facility] Adult PT Treatment/Exercise - 01/18/17 1443      Knee/Hip Exercises: Stretches   Active Hamstring Stretch 2 reps;30 seconds   Hip Flexor Stretch 2 reps;30 seconds     Knee/Hip Exercises: Seated  Heel Slides Left;1 set;10 reps     Knee/Hip Exercises: Supine   Straight Leg Raises 2 sets;10 reps;Strengthening;Left  with quad set     Modalities   Modalities Vasopneumatic     Vasopneumatic   Number Minutes Vasopneumatic  10 minutes   Vasopnuematic Location  Knee   Vasopneumatic Pressure Medium   Vasopneumatic Temperature  34                PT Education - 01/18/17 1537    Education provided Yes   Education Details evaluation findings, POC, goals, HEP with proper form/ rationale.    Person(s) Educated Patient   Methods Explanation;Verbal cues;Handout;Demonstration   Comprehension Verbalized understanding;Verbal cues required;Returned demonstration          PT Short Term Goals - 01/18/17 1549      PT SHORT TERM GOAL #1   Title pt to be I with inital HEP (02/18/2017)   Time 4   Period Weeks   Status New     PT SHORT TERM GOAL #2   Title pt to increase L knee flexion to >/= 110 degrees and extension to </=-10 degrees to promote functional mobility (02/18/2017)   Time 4   Period Weeks   Status New     PT SHORT TERM GOAL #3   Title decrease L knee edema by >/= 3 cm globally to reduce pain and promote knee mobility (02/18/2017)   Time 4   Period Weeks   Status New     PT SHORT TERM GOAL #4   Title pt to ambulate >/= 150 ft without AD and navigate up/ down 10 steps with 1 HHA for safety reciprocally (02/18/2017)   Time 4   Period Weeks   Status New           PT Long Term Goals - 01/18/17 1553      PT LONG TERM GOAL #1   Title pt to increase L knee mobility to -3 to 120 degrees for functional mobility required for efficient gait pattern (03/15/2017)   Time 8   Period Weeks    Status New     PT LONG TERM GOAL #2   Title Increase L knee strength to >/= 4+/5 to promote knee stability with prolonged walking and standing activities (03/15/2017)   Time 8   Period Weeks   Status New     PT LONG TERM GOAL #3   Title pt will be able to stand/ walk >/= 1 hour and navigate >/= 20 steps reciprocally with </= 1 HHA for safety PRN for functional endurance required for work activities and tasks (03/15/2017)   Time 8   Period Weeks   Status New     PT LONG TERM GOAL #4   Title pt will increase FOTO score to </= 43% limited to demo improvement in function (03/15/2017)   Time 8   Period Weeks   Status New     PT LONG TERM GOAL #5   Title pt to be I with all HEP given as of last visit (03/15/2017)   Time 8   Period Weeks   Status New                Plan - 01/18/17 1540    Clinical Impression Statement pt presents to OPPT s/p L TKA on 12/30/2016. Limited L knee AROM/ PROM compared bil to be expected following surgery. limited strength in the L knee compared bil with pain during testing with extension. she would  benefit from physical therapy to improve knee mobility, reduce swelling, increase strength, promote gait efficiency and maximize her function by addressing the deficits listed.    Clinical Presentation Stable   Clinical Decision Making Low   Rehab Potential Good   PT Frequency 2x / week   PT Duration 8 weeks   PT Treatment/Interventions ADLs/Self Care Home Management;Cryotherapy;Electrical Stimulation;Iontophoresis 4mg /ml Dexamethasone;Moist Heat;Therapeutic activities;Therapeutic exercise;Passive range of motion;Vasopneumatic Device;Manual techniques;Neuromuscular re-education;Patient/family education;Gait training;Stair training;Ultrasound   PT Next Visit Plan assess/ review HEP and update PRN, bike vs Nu-step, knee mobs, quad/ hip strengthening, gait training, vaso for pain and swelling,    PT Home Exercise Plan previouse HEP from HHPT, seated heel slides,  SLR with quad set, hamstring / hip flexor stretching,    Consulted and Agree with Plan of Care Patient      Patient will benefit from skilled therapeutic intervention in order to improve the following deficits and impairments:  Abnormal gait, Pain, Improper body mechanics, Postural dysfunction, Decreased strength, Increased edema, Decreased endurance, Decreased activity tolerance, Decreased balance, Decreased range of motion, Increased fascial restricitons  Visit Diagnosis: Other abnormalities of gait and mobility - Plan: PT plan of care cert/re-cert  Chronic pain of left knee - Plan: PT plan of care cert/re-cert  Stiffness of left knee, not elsewhere classified - Plan: PT plan of care cert/re-cert  Muscle weakness (generalized) - Plan: PT plan of care cert/re-cert  Localized edema - Plan: PT plan of care cert/re-cert     Problem List Patient Active Problem List   Diagnosis Date Noted  . Primary osteoarthritis of left knee 12/30/2016  . Acute low back pain without sciatica 11/10/2016  . Obesity 05/24/2016  . Hyperglycemia 02/24/2014  . Osteoarthritis of right knee 11/15/2013  . Osteoarthritis of left knee 11/15/2013  . Alkaline phosphatase elevation 01/02/2012  . Hyperlipidemia 01/02/2012  . Routine general medical examination at a health care facility 12/09/2011  . 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 POLYARTHRITIS 01/28/2010  . ANGIOEDEMA 03/27/2007  . Essential hypertension 03/19/2007  . Perennial allergic rhinitis with seasonal variation 03/19/2007  . Moderate intermittent asthma 03/19/2007  . GERD 03/19/2007  . HIATAL HERNIA 03/19/2007  . GESTATIONAL DIABETES 03/19/2007   Starr Lake PT, DPT, LAT, [name]  01/18/17  4:48 PM      [facility] [address] [place], [place], [address] Phone: [phone]   Fax:  [phone]  Name: Glenys Snader MRN: [id number] Date of Birth: [birth year]

## 2017-01-20 ENCOUNTER — Ambulatory Visit: Payer: 59 | Admitting: Physical Therapy

## 2017-01-20 DIAGNOSIS — M6281 Muscle weakness (generalized): Secondary | ICD-10-CM | POA: Diagnosis not present

## 2017-01-20 DIAGNOSIS — G8929 Other chronic pain: Secondary | ICD-10-CM

## 2017-01-20 DIAGNOSIS — R600 Localized edema: Secondary | ICD-10-CM | POA: Diagnosis not present

## 2017-01-20 DIAGNOSIS — M25662 Stiffness of left knee, not elsewhere classified: Secondary | ICD-10-CM

## 2017-01-20 DIAGNOSIS — R2689 Other abnormalities of gait and mobility: Secondary | ICD-10-CM | POA: Diagnosis not present

## 2017-01-20 DIAGNOSIS — M25562 Pain in left knee: Secondary | ICD-10-CM

## 2017-01-20 NOTE — Therapy (Signed)
[place], [place], [address] Phone: [phone]   Fax:  [phone]  Physical Therapy Treatment  Patient Details  Name: Katherine Tanner MRN: [id number] Date of Birth: Sep 03, 1960 Referring Provider: Lowella Petties MD  Encounter Date: 01/20/2017      PT End of Session - 01/20/17 0827    Visit Number 2   Number of Visits 17   Date for PT Re-Evaluation 03/15/17   PT Start Time 2297   PT Stop Time 0846   PT Time Calculation (min) 51 min      Past Medical History:  Diagnosis Date  . Allergy    takes Claritin daily and Flonase daily  . Arthritis   . Asthma    uses Albuterol daily as needed;Singulair nightly;DUlera daily  . GERD (gastroesophageal reflux disease)    occasionally will take OTC meds but states she just watches what she eats  . H/O hiatal hernia   . History of bronchitis    last time 16yrs ago  . History of colon polyps   . History of kidney stones   . History of shingles   . Hypertension    takes Hyzaar daily  . Joint pain   . Joint swelling   . Pneumonia    hx of;last time at least 36yrs ago  . PONV (postoperative nausea and vomiting)     Past Surgical History:  Procedure Laterality Date  . BREAST EXCISIONAL BIOPSY Right   . CARDIAC CATHETERIZATION    . COLONOSCOPY    . ESOPHAGOGASTRODUODENOSCOPY    . KNEE SURGERY Right    arthroscopy  . Right ganglion cyst     x 2  . right lumpectomy  around 1983  . STERIOD INJECTION Left 11/15/2013   Procedure: Marcaine/STEROID INJECTION;  Surgeon: Alta Corning, MD;  Location: [facility];  Service: Orthopedics;  Laterality: Left;  . TOTAL KNEE ARTHROPLASTY Right 11/15/2013   Procedure: RIGHT TOTAL KNEE ARTHROPLASTY;  Surgeon: Alta Corning, MD;  Location: [facility];  Service: Orthopedics;  Laterality: Right;  . TOTAL KNEE ARTHROPLASTY Left 12/30/2016   Procedure: TOTAL KNEE ARTHROPLASTY;  Surgeon: Dorna Leitz, MD;  Location: [facility];   Service: Orthopedics;  Laterality: Left;  . TUBAL LIGATION      There were no vitals filed for this visit.      Subjective Assessment - 01/20/17 0807    Subjective Stiff this morning    Currently in Pain? Yes   Pain Score 4    Pain Location Knee   Pain Orientation Left   Pain Descriptors / Indicators Tightness   Pain Type Surgical pain            [facility] PT Assessment - 01/20/17 0001      AROM   Left Knee Flexion 105     PROM   Left Knee Extension -10                     [facility] Adult PT Treatment/Exercise - 01/20/17 0001      Knee/Hip Exercises: Stretches   Active Hamstring Stretch Limitations seated EOB 10 sec x 3 , and supine with strap 30 sec x 2      Knee/Hip Exercises: Aerobic   Nustep L3 x 6 minutes      Knee/Hip Exercises: Seated   Long Arc Quad 20 reps     Knee/Hip Exercises: Supine   Quad Sets 10 reps   Short Arc [employer] 20  reps   Heel Slides 10 reps   Terminal Knee Extension Limitations heel prop with press in to towel 10 x 5 sec    Bridges Limitations x 10    Straight Leg Raises 2 sets;10 reps;Strengthening;Left  with quad set     Modalities   Modalities Vasopneumatic     Vasopneumatic   Number Minutes Vasopneumatic  10 minutes   Vasopnuematic Location  Knee   Vasopneumatic Pressure Medium   Vasopneumatic Temperature  34                  PT Short Term Goals - 01/18/17 1549      PT SHORT TERM GOAL #1   Title pt to be I with inital HEP (02/18/2017)   Time 4   Period Weeks   Status New     PT SHORT TERM GOAL #2   Title pt to increase L knee flexion to >/= 110 degrees and extension to </=-10 degrees to promote functional mobility (02/18/2017)   Time 4   Period Weeks   Status New     PT SHORT TERM GOAL #3   Title decrease L knee edema by >/= 3 cm globally to reduce pain and promote knee mobility (02/18/2017)   Time 4   Period Weeks   Status New     PT SHORT TERM GOAL #4   Title pt to ambulate >/= 150 ft  without AD and navigate up/ down 10 steps with 1 HHA for safety reciprocally (02/18/2017)   Time 4   Period Weeks   Status New           PT Long Term Goals - 01/18/17 1553      PT LONG TERM GOAL #1   Title pt to increase L knee mobility to -3 to 120 degrees for functional mobility required for efficient gait pattern (03/15/2017)   Time 8   Period Weeks   Status New     PT LONG TERM GOAL #2   Title Increase L knee strength to >/= 4+/5 to promote knee stability with prolonged walking and standing activities (03/15/2017)   Time 8   Period Weeks   Status New     PT LONG TERM GOAL #3   Title pt will be able to stand/ walk >/= 1 hour and navigate >/= 20 steps reciprocally with </= 1 HHA for safety PRN for functional endurance required for work activities and tasks (03/15/2017)   Time 8   Period Weeks   Status New     PT LONG TERM GOAL #4   Title pt will increase FOTO score to </= 43% limited to demo improvement in function (03/15/2017)   Time 8   Period Weeks   Status New     PT LONG TERM GOAL #5   Title pt to be I with all HEP given as of last visit (03/15/2017)   Time 8   Period Weeks   Status New               Plan - 01/20/17 6967    Clinical Impression Statement Pt demonstrates improved knee extension. Continued stretch and strengthen to promote increased ROM. Vaso repeated for pain and edema management.    PT Next Visit Plan assess/ review HEP and update PRN, bike vs Nu-step, knee mobs, quad/ hip strengthening, gait training, vaso for pain and swelling,    PT Home Exercise Plan previouse HEP from HHPT, seated heel slides, SLR with quad set, hamstring /  hip flexor stretching,    Consulted and Agree with Plan of Care Patient      Patient will benefit from skilled therapeutic intervention in order to improve the following deficits and impairments:  Abnormal gait, Pain, Improper body mechanics, Postural dysfunction, Decreased strength, Increased edema, Decreased  endurance, Decreased activity tolerance, Decreased balance, Decreased range of motion, Increased fascial restricitons  Visit Diagnosis: Other abnormalities of gait and mobility  Chronic pain of left knee  Stiffness of left knee, not elsewhere classified  Muscle weakness (generalized)  Localized edema     Problem List Patient Active Problem List   Diagnosis Date Noted  . Primary osteoarthritis of left knee 12/30/2016  . Acute low back pain without sciatica 11/10/2016  . Obesity 05/24/2016  . Hyperglycemia 02/24/2014  . Osteoarthritis of right knee 11/15/2013  . Osteoarthritis of left knee 11/15/2013  . Alkaline phosphatase elevation 01/02/2012  . Hyperlipidemia 01/02/2012  . Routine general medical examination at a health care facility 12/09/2011  . 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 POLYARTHRITIS 01/28/2010  . ANGIOEDEMA 03/27/2007  . Essential hypertension 03/19/2007  . Perennial allergic rhinitis with seasonal variation 03/19/2007  . Moderate intermittent asthma 03/19/2007  . GERD 03/19/2007  . HIATAL HERNIA 03/19/2007  . GESTATIONAL DIABETES 03/19/2007    Dorene Ar, PTA 01/20/2017, 8:49 AM  [place], [place], [address] Phone: [phone]   Fax:  [phone]  Name: Katherine Tanner MRN: [id number] Date of Birth: [birth year]

## 2017-01-30 MED FILL — BREO ELLIPTA 100-25 MCG INH: 100-25 | 30 days supply | Qty: 60 | Fill #8

## 2017-01-31 ENCOUNTER — Ambulatory Visit: Payer: 59 | Admitting: Physical Therapy

## 2017-01-31 DIAGNOSIS — R600 Localized edema: Secondary | ICD-10-CM

## 2017-01-31 DIAGNOSIS — R2689 Other abnormalities of gait and mobility: Secondary | ICD-10-CM

## 2017-01-31 DIAGNOSIS — M25662 Stiffness of left knee, not elsewhere classified: Secondary | ICD-10-CM | POA: Diagnosis not present

## 2017-01-31 DIAGNOSIS — M25562 Pain in left knee: Secondary | ICD-10-CM

## 2017-01-31 DIAGNOSIS — G8929 Other chronic pain: Secondary | ICD-10-CM

## 2017-01-31 DIAGNOSIS — M6281 Muscle weakness (generalized): Secondary | ICD-10-CM

## 2017-01-31 NOTE — Therapy (Signed)
[place], [place], [address] Phone: [phone]   Fax:  [phone]  Physical Therapy Treatment  Patient Details  Name: Katherine Tanner MRN: [id number] Date of Birth: Feb 07, 1961 Referring Provider: Lowella Petties MD  Encounter Date: 01/31/2017      PT End of Session - 01/31/17 0934    Visit Number 3   Number of Visits 17   Date for PT Re-Evaluation 03/15/17   PT Start Time 0929   PT Stop Time 1030   PT Time Calculation (min) 61 min      Past Medical History:  Diagnosis Date  . Allergy    takes Claritin daily and Flonase daily  . Arthritis   . Asthma    uses Albuterol daily as needed;Singulair nightly;DUlera daily  . GERD (gastroesophageal reflux disease)    occasionally will take OTC meds but states she just watches what she eats  . H/O hiatal hernia   . History of bronchitis    last time 36yrs ago  . History of colon polyps   . History of kidney stones   . History of shingles   . Hypertension    takes Hyzaar daily  . Joint pain   . Joint swelling   . Pneumonia    hx of;last time at least 54yrs ago  . PONV (postoperative nausea and vomiting)     Past Surgical History:  Procedure Laterality Date  . BREAST EXCISIONAL BIOPSY Right   . CARDIAC CATHETERIZATION    . COLONOSCOPY    . ESOPHAGOGASTRODUODENOSCOPY    . KNEE SURGERY Right    arthroscopy  . Right ganglion cyst     x 2  . right lumpectomy  around 1983  . STERIOD INJECTION Left 11/15/2013   Procedure: Marcaine/STEROID INJECTION;  Surgeon: Alta Corning, MD;  Location: [facility];  Service: Orthopedics;  Laterality: Left;  . TOTAL KNEE ARTHROPLASTY Right 11/15/2013   Procedure: RIGHT TOTAL KNEE ARTHROPLASTY;  Surgeon: Alta Corning, MD;  Location: [facility];  Service: Orthopedics;  Laterality: Right;  . TOTAL KNEE ARTHROPLASTY Left 12/30/2016   Procedure: TOTAL KNEE ARTHROPLASTY;  Surgeon: Dorna Leitz, MD;  Location: [facility];   Service: Orthopedics;  Laterality: Left;  . TUBAL LIGATION      There were no vitals filed for this visit.      Subjective Assessment - 01/31/17 0931    Subjective Still swollen and sore. Walked for 30 minutes with rest breaks and had a lot of pain the next day.    Currently in Pain? Yes   Pain Score 3    Pain Location Knee   Pain Orientation Left   Pain Descriptors / Indicators Sore   Pain Type Surgical pain   Aggravating Factors  prolonged walking    Pain Relieving Factors meds ice elevation                         [facility] Adult PT Treatment/Exercise - 01/31/17 0001      Self-Care   Self-Care Retrograde Massage   Retrograde Massage Instructed pt in retro massage for edema at home      Knee/Hip Exercises: Stretches   Active Hamstring Stretch 2 reps;30 seconds  strap   Active Hamstring Stretch Limitations seated EOB 10 sec x 3 , and supine with strap 30 sec x 2    Gastroc Stretch Limitations slant board x 60 sec      Knee/Hip Exercises: Aerobic  Nustep L4 x 6 minutes      Knee/Hip Exercises: Seated   Long Arc Quad 20 reps   Long Arc Quad Weight 5 lbs.     Knee/Hip Exercises: Supine   Quad Sets 10 reps   Short Arc Quad Sets 20 reps   Short Arc Quad Sets Limitations 5#   Terminal Knee Extension Limitations --   Straight Leg Raises 2 sets;10 reps;Strengthening;Left  with quad set   Patellar Mobs all planes      Vasopneumatic   Number Minutes Vasopneumatic  15 minutes   Vasopnuematic Location  Knee   Vasopneumatic Pressure Medium   Vasopneumatic Temperature  34     Manual Therapy   Manual Therapy Taping   Kinesiotex Edema     Kinesiotix   Edema 2 fans left knee                   PT Short Term Goals - 01/18/17 1549      PT SHORT TERM GOAL #1   Title pt to be I with inital HEP (02/18/2017)   Time 4   Period Weeks   Status New     PT SHORT TERM GOAL #2   Title pt to increase L knee flexion to >/= 110 degrees and extension to  </=-10 degrees to promote functional mobility (02/18/2017)   Time 4   Period Weeks   Status New     PT SHORT TERM GOAL #3   Title decrease L knee edema by >/= 3 cm globally to reduce pain and promote knee mobility (02/18/2017)   Time 4   Period Weeks   Status New     PT SHORT TERM GOAL #4   Title pt to ambulate >/= 150 ft without AD and navigate up/ down 10 steps with 1 HHA for safety reciprocally (02/18/2017)   Time 4   Period Weeks   Status New           PT Long Term Goals - 01/18/17 1553      PT LONG TERM GOAL #1   Title pt to increase L knee mobility to -3 to 120 degrees for functional mobility required for efficient gait pattern (03/15/2017)   Time 8   Period Weeks   Status New     PT LONG TERM GOAL #2   Title Increase L knee strength to >/= 4+/5 to promote knee stability with prolonged walking and standing activities (03/15/2017)   Time 8   Period Weeks   Status New     PT LONG TERM GOAL #3   Title pt will be able to stand/ walk >/= 1 hour and navigate >/= 20 steps reciprocally with </= 1 HHA for safety PRN for functional endurance required for work activities and tasks (03/15/2017)   Time 8   Period Weeks   Status New     PT LONG TERM GOAL #4   Title pt will increase FOTO score to </= 43% limited to demo improvement in function (03/15/2017)   Time 8   Period Weeks   Status New     PT LONG TERM GOAL #5   Title pt to be I with all HEP given as of last visit (03/15/2017)   Time 8   Period Weeks   Status New               Plan - 01/31/17 1300    Clinical Impression Statement Improving knee flexion. Extension still llimited. Patient c/o swelling causing  pain with transition from flexion <-> extension. Trial of Kinesiotape for Edema followed by vasopneumatic device to decrease edema. Also instructed pt in self retro massage and reviewed RICE. Recommend she elevated knee above heart while icing several times per day.    PT Next Visit Plan assess/ review HEP  and update PRN, bike vs Nu-step, knee mobs, quad/ hip strengthening, gait training, vaso for pain and swelling,    PT Home Exercise Plan previouse HEP from HHPT, seated heel slides, SLR with quad set, hamstring / hip flexor stretching,    Consulted and Agree with Plan of Care Patient      Patient will benefit from skilled therapeutic intervention in order to improve the following deficits and impairments:  Abnormal gait, Pain, Improper body mechanics, Postural dysfunction, Decreased strength, Increased edema, Decreased endurance, Decreased activity tolerance, Decreased balance, Decreased range of motion, Increased fascial restricitons  Visit Diagnosis: Other abnormalities of gait and mobility  Chronic pain of left knee  Stiffness of left knee, not elsewhere classified  Muscle weakness (generalized)  Localized edema     Problem List Patient Active Problem List   Diagnosis Date Noted  . Primary osteoarthritis of left knee 12/30/2016  . Acute low back pain without sciatica 11/10/2016  . Obesity 05/24/2016  . Hyperglycemia 02/24/2014  . Osteoarthritis of right knee 11/15/2013  . Osteoarthritis of left knee 11/15/2013  . Alkaline phosphatase elevation 01/02/2012  . Hyperlipidemia 01/02/2012  . Routine general medical examination at a health care facility 12/09/2011  . 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 POLYARTHRITIS 01/28/2010  . ANGIOEDEMA 03/27/2007  . Essential hypertension 03/19/2007  . Perennial allergic rhinitis with seasonal variation 03/19/2007  . Moderate intermittent asthma 03/19/2007  . GERD 03/19/2007  . HIATAL HERNIA 03/19/2007  . GESTATIONAL DIABETES 03/19/2007    Dorene Ar, PTA 01/31/2017, 1:04 PM  [place], [place], [address] Phone: [phone]   Fax:  [phone]  Name: Katherine Tanner MRN: [id number] Date of Birth: March 13, 1961

## 2017-02-03 ENCOUNTER — Ambulatory Visit: Payer: 59 | Attending: Orthopedic Surgery | Admitting: Physical Therapy

## 2017-02-03 DIAGNOSIS — R600 Localized edema: Secondary | ICD-10-CM

## 2017-02-03 DIAGNOSIS — R2689 Other abnormalities of gait and mobility: Secondary | ICD-10-CM

## 2017-02-03 DIAGNOSIS — G8929 Other chronic pain: Secondary | ICD-10-CM

## 2017-02-03 DIAGNOSIS — M25562 Pain in left knee: Secondary | ICD-10-CM | POA: Diagnosis not present

## 2017-02-03 DIAGNOSIS — M25662 Stiffness of left knee, not elsewhere classified: Secondary | ICD-10-CM

## 2017-02-03 DIAGNOSIS — M6281 Muscle weakness (generalized): Secondary | ICD-10-CM

## 2017-02-03 NOTE — Therapy (Signed)
[place], [place], [address] Phone: [phone]   Fax:  [phone]  Physical Therapy Treatment  Patient Details  Name: Katherine Tanner MRN: [id number] Date of Birth: 1960-12-17 Referring Provider: Lowella Petties MD  Encounter Date: 02/03/2017      PT End of Session - 02/03/17 1022    Visit Number 4   Number of Visits 17   Date for PT Re-Evaluation 03/15/17   PT Start Time 0932   PT Stop Time 1022   PT Time Calculation (min) 50 min   Activity Tolerance Patient tolerated treatment well   Behavior During Therapy [facility] for tasks assessed/performed      Past Medical History:  Diagnosis Date  . Allergy    takes Claritin daily and Flonase daily  . Arthritis   . Asthma    uses Albuterol daily as needed;Singulair nightly;DUlera daily  . GERD (gastroesophageal reflux disease)    occasionally will take OTC meds but states she just watches what she eats  . H/O hiatal hernia   . History of bronchitis    last time 13yrs ago  . History of colon polyps   . History of kidney stones   . History of shingles   . Hypertension    takes Hyzaar daily  . Joint pain   . Joint swelling   . Pneumonia    hx of;last time at least 66yrs ago  . PONV (postoperative nausea and vomiting)     Past Surgical History:  Procedure Laterality Date  . BREAST EXCISIONAL BIOPSY Right   . CARDIAC CATHETERIZATION    . COLONOSCOPY    . ESOPHAGOGASTRODUODENOSCOPY    . KNEE SURGERY Right    arthroscopy  . Right ganglion cyst     x 2  . right lumpectomy  around 1983  . STERIOD INJECTION Left 11/15/2013   Procedure: Marcaine/STEROID INJECTION;  Surgeon: Alta Corning, MD;  Location: [facility];  Service: Orthopedics;  Laterality: Left;  . TOTAL KNEE ARTHROPLASTY Right 11/15/2013   Procedure: RIGHT TOTAL KNEE ARTHROPLASTY;  Surgeon: Alta Corning, MD;  Location: [facility];  Service: Orthopedics;  Laterality: Right;  . TOTAL KNEE  ARTHROPLASTY Left 12/30/2016   Procedure: TOTAL KNEE ARTHROPLASTY;  Surgeon: Dorna Leitz, MD;  Location: [facility];  Service: Orthopedics;  Laterality: Left;  . TUBAL LIGATION      There were no vitals filed for this visit.      Subjective Assessment - 02/03/17 0935    Subjective " I am still sore today, I think its the rain"    Currently in Pain? Yes   Pain Score 4    Pain Location Knee   Pain Orientation Left   Pain Descriptors / Indicators Sore   Pain Frequency Intermittent            [facility] PT Assessment - 02/03/17 0952      AROM   Left Knee Extension -15   Left Knee Flexion 115                     [facility] Adult PT Treatment/Exercise - 02/03/17 0937      Knee/Hip Exercises: Stretches   Active Hamstring Stretch 2 reps;30 seconds  with strap   Quad Stretch 3 reps;30 seconds  prone strap   Gastroc Stretch Limitations slant board 2 x 30 sec hold     Knee/Hip Exercises: Aerobic   Nustep L5 x 5 min UE/LE  Knee/Hip Exercises: Standing   Gait Training retro stepping 2 x 10 with CGA for safety   to increase quad activation     Knee/Hip Exercises: Supine   Quad Sets 2 sets;10 reps  holding 3 sec    Quad Sets Limitations with towel in popliteal space for improved quad contraction   Short Arc Quad Sets 20 reps   Short Arc Quad Sets Limitations 5#     Modalities   Modalities Moist Heat     Moist Heat Therapy   Number Minutes Moist Heat 10 Minutes   Moist Heat Location Knee     Manual Therapy   Manual Therapy Soft tissue mobilization   Soft tissue mobilization anterior/ posterior mobs in sitting with distraction grade 3  patellar mobs grade 3 in all directions                  PT Short Term Goals - 01/18/17 1549      PT SHORT TERM GOAL #1   Title pt to be I with inital HEP (02/18/2017)   Time 4   Period Weeks   Status New     PT SHORT TERM GOAL #2   Title pt to increase L knee flexion to >/= 110 degrees and extension to </=-10  degrees to promote functional mobility (02/18/2017)   Time 4   Period Weeks   Status New     PT SHORT TERM GOAL #3   Title decrease L knee edema by >/= 3 cm globally to reduce pain and promote knee mobility (02/18/2017)   Time 4   Period Weeks   Status New     PT SHORT TERM GOAL #4   Title pt to ambulate >/= 150 ft without AD and navigate up/ down 10 steps with 1 HHA for safety reciprocally (02/18/2017)   Time 4   Period Weeks   Status New           PT Long Term Goals - 01/18/17 1553      PT LONG TERM GOAL #1   Title pt to increase L knee mobility to -3 to 120 degrees for functional mobility required for efficient gait pattern (03/15/2017)   Time 8   Period Weeks   Status New     PT LONG TERM GOAL #2   Title Increase L knee strength to >/= 4+/5 to promote knee stability with prolonged walking and standing activities (03/15/2017)   Time 8   Period Weeks   Status New     PT LONG TERM GOAL #3   Title pt will be able to stand/ walk >/= 1 hour and navigate >/= 20 steps reciprocally with </= 1 HHA for safety PRN for functional endurance required for work activities and tasks (03/15/2017)   Time 8   Period Weeks   Status New     PT LONG TERM GOAL #4   Title pt will increase FOTO score to </= 43% limited to demo improvement in function (03/15/2017)   Time 8   Period Weeks   Status New     PT LONG TERM GOAL #5   Title pt to be I with all HEP given as of last visit (03/15/2017)   Time 8   Period Weeks   Status New               Plan - 02/03/17 1022    Clinical Impression Statement pt continues to demonstrate improvement in knee mobility increaseing flexion to 115 degrees and  extension to -15. continues stretching and working on knee mobs to improve ROM. added retro stepping to invoke involuntary quad contraction which she performed well requiring CGA for safety. trialed MHP post session to calm down swelling and pain.    PT Next Visit Plan assess/ review HEP and update  PRN, bike vs Nu-step, knee mobs, quad/ hip strengthening, gait training, vaso for pain and swelling,    PT Home Exercise Plan previouse HEP from HHPT, seated heel slides, SLR with quad set, hamstring / hip flexor stretching,    Consulted and Agree with Plan of Care Patient      Patient will benefit from skilled therapeutic intervention in order to improve the following deficits and impairments:  Abnormal gait, Pain, Improper body mechanics, Postural dysfunction, Decreased strength, Increased edema, Decreased endurance, Decreased activity tolerance, Decreased balance, Decreased range of motion, Increased fascial restricitons  Visit Diagnosis: Other abnormalities of gait and mobility  Chronic pain of left knee  Stiffness of left knee, not elsewhere classified  Muscle weakness (generalized)  Localized edema     Problem List Patient Active Problem List   Diagnosis Date Noted  . Primary osteoarthritis of left knee 12/30/2016  . Acute low back pain without sciatica 11/10/2016  . Obesity 05/24/2016  . Hyperglycemia 02/24/2014  . Osteoarthritis of right knee 11/15/2013  . Osteoarthritis of left knee 11/15/2013  . Alkaline phosphatase elevation 01/02/2012  . Hyperlipidemia 01/02/2012  . Routine general medical examination at a health care facility 12/09/2011  . 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 POLYARTHRITIS 01/28/2010  . ANGIOEDEMA 03/27/2007  . Essential hypertension 03/19/2007  . Perennial allergic rhinitis with seasonal variation 03/19/2007  . Moderate intermittent asthma 03/19/2007  . GERD 03/19/2007  . HIATAL HERNIA 03/19/2007  . GESTATIONAL DIABETES 03/19/2007   Starr Lake PT, DPT, LAT, [name]  02/03/17  10:28 AM      [facility] [address] [place], [place], [address] Phone: [phone]   Fax:  [phone]  Name: Katherine Tanner MRN: [id number] Date of Birth: Jun 12, 1961

## 2017-02-06 ENCOUNTER — Encounter: Payer: Self-pay | Admitting: Physical Therapy

## 2017-02-06 ENCOUNTER — Ambulatory Visit: Payer: 59 | Admitting: Physical Therapy

## 2017-02-06 DIAGNOSIS — M25662 Stiffness of left knee, not elsewhere classified: Secondary | ICD-10-CM

## 2017-02-06 DIAGNOSIS — M6281 Muscle weakness (generalized): Secondary | ICD-10-CM | POA: Diagnosis not present

## 2017-02-06 DIAGNOSIS — G8929 Other chronic pain: Secondary | ICD-10-CM | POA: Diagnosis not present

## 2017-02-06 DIAGNOSIS — R2689 Other abnormalities of gait and mobility: Secondary | ICD-10-CM | POA: Diagnosis not present

## 2017-02-06 DIAGNOSIS — M25562 Pain in left knee: Secondary | ICD-10-CM | POA: Diagnosis not present

## 2017-02-06 DIAGNOSIS — R600 Localized edema: Secondary | ICD-10-CM | POA: Diagnosis not present

## 2017-02-06 NOTE — Therapy (Signed)
[place], [place], [address] Phone: [phone]   Fax:  [phone]  Physical Therapy Treatment  Patient Details  Name: Katherine Tanner MRN: [id number] Date of Birth: June 21, 1961 Referring Provider: Lowella Petties MD  Encounter Date: 02/06/2017      PT End of Session - 02/06/17 0911    Visit Number 5   Number of Visits 12   Date for PT Re-Evaluation 03/15/17   PT Start Time 0846   PT Stop Time 0944   PT Time Calculation (min) 58 min   Activity Tolerance Patient tolerated treatment well   Behavior During Therapy [facility] for tasks assessed/performed      Past Medical History:  Diagnosis Date  . Allergy    takes Claritin daily and Flonase daily  . Arthritis   . Asthma    uses Albuterol daily as needed;Singulair nightly;DUlera daily  . GERD (gastroesophageal reflux disease)    occasionally will take OTC meds but states she just watches what she eats  . H/O hiatal hernia   . History of bronchitis    last time 35yr ago  . History of colon polyps   . History of kidney stones   . History of shingles   . Hypertension    takes Hyzaar daily  . Joint pain   . Joint swelling   . Pneumonia    hx of;last time at least 243yrago  . PONV (postoperative nausea and vomiting)     Past Surgical History:  Procedure Laterality Date  . BREAST EXCISIONAL BIOPSY Right   . CARDIAC CATHETERIZATION    . COLONOSCOPY    . ESOPHAGOGASTRODUODENOSCOPY    . KNEE SURGERY Right    arthroscopy  . Right ganglion cyst     x 2  . right lumpectomy  around 1983  . STERIOD INJECTION Left 11/15/2013   Procedure: Marcaine/STEROID INJECTION;  Surgeon: JoAlta CorningMD;  [facility];  Laterality: Left;  . TOTAL KNEE ARTHROPLASTY Right 11/15/2013   Procedure: RIGHT TOTAL KNEE ARTHROPLASTY;  Surgeon: JoAlta CorningMD;  [place] [facility];  Laterality: Right;  . TOTAL KNEE  ARTHROPLASTY Left 12/30/2016   Procedure: TOTAL KNEE ARTHROPLASTY;  Surgeon: GrDorna LeitzMD;  [place] [facility] [place];  Laterality: Left;  . TUBAL LIGATION      There were no vitals filed for this visit.      Subjective Assessment - 02/06/17 0850    Subjective Its not too bad today.  About 3/10, did not do much this weekend.     Patient Stated Goals Get rid of the cane, return to driving, stand for long period time (job), return to the gym   Currently in Pain? Yes   Pain Score 3    Pain Location Knee   Pain Orientation Left   Pain Descriptors / Indicators Sore   Pain Type Surgical pain   Pain Onset More than a month ago   Pain Frequency Intermittent   Aggravating Factors  walking, at night pain increases    Pain Relieving Factors meds, ice, RICE               [facility] Adult PT Treatment/Exercise - 02/06/17 0001      Ambulation/Gait   Ambulation/Gait Yes   Ambulation/Gait Assistance 6: Modified independent (Device/Increase time)   Ambulation Distance (Feet) 200 Feet   Assistive device Straight cane   Gait Pattern Step-through pattern;Decreased stride length;Trendelenburg;Antalgic;Decreased  trunk rotation;Trunk flexed   Ambulation Surface Level;Indoor   Gait Comments heel strike cues      Knee/Hip Exercises: Stretches   Active Hamstring Stretch Both;3 reps;30 seconds   Active Hamstring Stretch Limitations seated and supine with strap    Quad Stretch 3 reps;30 seconds  prone strap   Gastroc Stretch Limitations slant board 2 x 30 sec hold   Other Knee/Hip Stretches ITB stretch with strap x 2, 30 sec      Knee/Hip Exercises: Aerobic   Nustep L6 x 65mn LE only      Knee/Hip Exercises: Standing   Other Standing Knee Exercises wall slides with both arms on wall x 15 sec x 5      Knee/Hip Exercises: Supine   Straight Leg Raises Strengthening;Left;1 set;15 reps   Straight Leg Raise with External Rotation Strengthening;Left;1 set;15 reps     Knee/Hip  Exercises: Prone   Hamstring Curl 1 set;10 reps   Hip Extension Strengthening;Left;1 set;10 reps   Straight Leg Raises Strengthening;Both;1 set;10 reps   Other Prone Exercises prone quad set x 5 sec x 10       Vasopneumatic   Number Minutes Vasopneumatic  15 minutes   Vasopnuematic Location  Knee   Vasopneumatic Pressure Medium   Vasopneumatic Temperature  34     Manual Therapy   Manual Therapy Joint mobilization   Manual therapy comments seated flex and ext mobs with belt    Joint Mobilization GR III-III ext mobs in supine and also seated with distraction                 PT Education - 02/06/17 0935    Education provided Yes   Education Details quad contraction with heel strike, closed chain, wall slides    Person(s) Educated Patient   Methods Explanation   Comprehension Verbalized understanding;Returned demonstration          PT Short Term Goals - 02/06/17 0941      PT SHORT TERM GOAL #1   Title pt to be I with inital HEP (02/18/2017)   Status On-going     PT SHORT TERM GOAL #2   Title pt to increase L knee flexion to >/= 110 degrees and extension to </=-10 degrees to promote functional mobility (02/18/2017)   Baseline flexion to 117 deg, ext not measured   Status Partially Met     PT SHORT TERM GOAL #3   Title decrease L knee edema by >/= 3 cm globally to reduce pain and promote knee mobility (02/18/2017)   Status Unable to assess     PT SHORT TERM GOAL #4   Title pt to ambulate >/= 150 ft without AD and navigate up/ down 10 steps with 1 HHA for safety reciprocally (02/18/2017)   Status On-going           PT Long Term Goals - 01/18/17 1553      PT LONG TERM GOAL #1   Title pt to increase L knee mobility to -3 to 120 degrees for functional mobility required for efficient gait pattern (03/15/2017)   Time 8   Period Weeks   Status New     PT LONG TERM GOAL #2   Title Increase L knee strength to >/= 4+/5 to promote knee stability with prolonged walking  and standing activities (03/15/2017)   Time 8   Period Weeks   Status New     PT LONG TERM GOAL #3   Title pt will be able to  stand/ walk >/= 1 hour and navigate >/= 20 steps reciprocally with </= 1 HHA for safety PRN for functional endurance required for work activities and tasks (03/15/2017)   Time 8   Period Weeks   Status New     PT LONG TERM GOAL #4   Title pt will increase FOTO score to </= 43% limited to demo improvement in function (03/15/2017)   Time 8   Period Weeks   Status New     PT LONG TERM GOAL #5   Title pt to be I with all HEP given as of last visit (03/15/2017)   Time 8   Period Weeks   Status New               Plan - 02/06/17 5974    Clinical Impression Statement AROM improving, strength improved to 4/5 quads and 4/5 hamstrings.  Cues for heel strike with gait.  Sees MD next week, plans to ask about pain control, pain increases after 4 hours.     PT Next Visit Plan assess/ review HEP and update PRN, bike vs Nu-step, knee mobs, quad/ hip strengthening, gait training, vaso for pain and swelling,    PT Home Exercise Plan previouse HEP from HHPT, seated heel slides, SLR with quad set, hamstring / hip flexor stretching,    Consulted and Agree with Plan of Care Patient      Patient will benefit from skilled therapeutic intervention in order to improve the following deficits and impairments:  Abnormal gait, Pain, Improper body mechanics, Postural dysfunction, Decreased strength, Increased edema, Decreased endurance, Decreased activity tolerance, Decreased balance, Decreased range of motion, Increased fascial restricitons  Visit Diagnosis: Other abnormalities of gait and mobility  Chronic pain of left knee  Stiffness of left knee, not elsewhere classified  Localized edema  Muscle weakness (generalized)     Problem List Patient Active Problem List   Diagnosis Date Noted  . Primary osteoarthritis of left knee 12/30/2016  . Acute low back pain without  sciatica 11/10/2016  . Obesity 05/24/2016  . Hyperglycemia 02/24/2014  . Osteoarthritis of right knee 11/15/2013  . Osteoarthritis of left knee 11/15/2013  . Alkaline phosphatase elevation 01/02/2012  . Hyperlipidemia 01/02/2012  . Routine general medical examination at a health care facility 12/09/2011  . 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 POLYARTHRITIS 01/28/2010  . ANGIOEDEMA 03/27/2007  . Essential hypertension 03/19/2007  . Perennial allergic rhinitis with seasonal variation 03/19/2007  . Moderate intermittent asthma 03/19/2007  . GERD 03/19/2007  . HIATAL HERNIA 03/19/2007  . GESTATIONAL DIABETES 03/19/2007    PAA,JENNIFER 02/06/2017, 9:43 AM  [facility] [address] [place], [place], [address] Phone: [phone]   Fax:  [phone]  Name: Cambell Rickenbach MRN: [id number] Date of Birth: [birth year]  Raeford Razor, PT 02/06/17 9:43 AM Phone: [phone] Fax: [phone]

## 2017-02-08 ENCOUNTER — Encounter: Payer: Self-pay | Admitting: Physical Therapy

## 2017-02-08 ENCOUNTER — Ambulatory Visit: Payer: 59 | Admitting: Physical Therapy

## 2017-02-08 DIAGNOSIS — M25562 Pain in left knee: Secondary | ICD-10-CM

## 2017-02-08 DIAGNOSIS — M25662 Stiffness of left knee, not elsewhere classified: Secondary | ICD-10-CM

## 2017-02-08 DIAGNOSIS — R600 Localized edema: Secondary | ICD-10-CM | POA: Diagnosis not present

## 2017-02-08 DIAGNOSIS — G8929 Other chronic pain: Secondary | ICD-10-CM | POA: Diagnosis not present

## 2017-02-08 DIAGNOSIS — M6281 Muscle weakness (generalized): Secondary | ICD-10-CM

## 2017-02-08 DIAGNOSIS — R2689 Other abnormalities of gait and mobility: Secondary | ICD-10-CM

## 2017-02-08 NOTE — Therapy (Signed)
[place], [place], [address] Phone: [phone]   Fax:  [phone]  Physical Therapy Treatment  Patient Details  Name: Katherine Tanner MRN: [id number] Date of Birth: 10-04-1960 Referring Provider: Lowella Petties MD  Encounter Date: 02/08/2017      PT End of Session - 02/08/17 0936    Visit Number 6   Number of Visits 12   Date for PT Re-Evaluation 03/15/17   PT Start Time 0846   PT Stop Time 0939   PT Time Calculation (min) 53 min   Activity Tolerance Patient tolerated treatment well   Behavior During Therapy [facility] for tasks assessed/performed      Past Medical History:  Diagnosis Date  . Allergy    takes Claritin daily and Flonase daily  . Arthritis   . Asthma    uses Albuterol daily as needed;Singulair nightly;DUlera daily  . GERD (gastroesophageal reflux disease)    occasionally will take OTC meds but states she just watches what she eats  . H/O hiatal hernia   . History of bronchitis    last time 50yr ago  . History of colon polyps   . History of kidney stones   . History of shingles   . Hypertension    takes Hyzaar daily  . Joint pain   . Joint swelling   . Pneumonia    hx of;last time at least 89yrago  . PONV (postoperative nausea and vomiting)     Past Surgical History:  Procedure Laterality Date  . BREAST EXCISIONAL BIOPSY Right   . CARDIAC CATHETERIZATION    . COLONOSCOPY    . ESOPHAGOGASTRODUODENOSCOPY    . KNEE SURGERY Right    arthroscopy  . Right ganglion cyst     x 2  . right lumpectomy  around 1983  . STERIOD INJECTION Left 11/15/2013   Procedure: Marcaine/STEROID INJECTION;  Surgeon: JoAlta CorningMD;  [place] [facility];  Laterality: Left;  . TOTAL KNEE ARTHROPLASTY Right 11/15/2013   Procedure: RIGHT TOTAL KNEE ARTHROPLASTY;  Surgeon: JoAlta CorningMD;  [place] [facility];  Laterality: Right;  . TOTAL KNEE  ARTHROPLASTY Left 12/30/2016   Procedure: TOTAL KNEE ARTHROPLASTY;  Surgeon: GrDorna LeitzMD;  [place] [facility];  Laterality: Left;  . TUBAL LIGATION      There were no vitals filed for this visit.      Subjective Assessment - 02/08/17 0852    Subjective "my back was alittle sore after the last session, it calmed down some since the last session"   Currently in Pain? Yes   Pain Score 3    Pain Orientation Left   Pain Onset More than a month ago   Pain Frequency Intermittent            [facility] PT Assessment - 02/08/17 0903      AROM   Left Knee Extension -8   Left Knee Flexion 120                     [facility] Adult PT Treatment/Exercise - 02/08/17 0853      Knee/Hip Exercises: Stretches   Active Hamstring Stretch 2 reps   Quad Stretch 3 reps;30 seconds  with strap   Gastroc Stretch Limitations slant board 2 x 30 sec hold     Knee/Hip Exercises: Aerobic   Stationary Bike L1 x 6 min  lowering seat eveyr 2 min  to increase knee flexion     Knee/Hip Exercises: Standing   Forward Step Up 2 sets;10 reps;Step Height: 6"   Wall Squat 2 sets;10 reps   Gait Training walking using metronome for equal step / stride 1 x 200 ft at 80 BPM, 1 x 265f at 85BPM     Vasopneumatic   Number Minutes Vasopneumatic  10 minutes   Vasopnuematic Location  Knee   Vasopneumatic Pressure Medium   Vasopneumatic Temperature  34     Manual Therapy   Manual therapy comments manual trigger point release over the hamstrings in prone with soft tissue work   Joint Mobilization GR III-III ext mobs in supine and also seated with distraction   with foot ER for screw-home mechanism                  PT Short Term Goals - 02/06/17 0941      PT SHORT TERM GOAL #1   Title pt to be I with inital HEP (02/18/2017)   Status On-going     PT SHORT TERM GOAL #2   Title pt to increase L knee flexion to >/= 110 degrees and extension to </=-10 degrees to promote  functional mobility (02/18/2017)   Baseline flexion to 117 deg, ext not measured   Status Partially Met     PT SHORT TERM GOAL #3   Title decrease L knee edema by >/= 3 cm globally to reduce pain and promote knee mobility (02/18/2017)   Status Unable to assess     PT SHORT TERM GOAL #4   Title pt to ambulate >/= 150 ft without AD and navigate up/ down 10 steps with 1 HHA for safety reciprocally (02/18/2017)   Status On-going           PT Long Term Goals - 01/18/17 1553      PT LONG TERM GOAL #1   Title pt to increase L knee mobility to -3 to 120 degrees for functional mobility required for efficient gait pattern (03/15/2017)   Time 8   Period Weeks   Status New     PT LONG TERM GOAL #2   Title Increase L knee strength to >/= 4+/5 to promote knee stability with prolonged walking and standing activities (03/15/2017)   Time 8   Period Weeks   Status New     PT LONG TERM GOAL #3   Title pt will be able to stand/ walk >/= 1 hour and navigate >/= 20 steps reciprocally with </= 1 HHA for safety PRN for functional endurance required for work activities and tasks (03/15/2017)   Time 8   Period Weeks   Status New     PT LONG TERM GOAL #4   Title pt will increase FOTO score to </= 43% limited to demo improvement in function (03/15/2017)   Time 8   Period Weeks   Status New     PT LONG TERM GOAL #5   Title pt to be I with all HEP given as of last visit (03/15/2017)   Time 8   Period Weeks   Status New               Plan - 02/08/17 01025   Clinical Impression Statement pt continues to improve mobility increasing knee flexion to 120 and extension to -8 degrees today. continued mobs with ER to promote extension. continued working on hip/ quad strength with wall squats and step ups. focused on gait training with metronome  for equal stride to reduce limping which she performed well. continued game ready post session for pain and swelling.    PT Next Visit Plan update HEP PRN,  bike  vs Nu-step, knee mobs, quad/ hip strengthening, gait training, vaso for pain and swelling, gait training, resisted gait, measurements for MD visit   PT Home Exercise Plan previouse HEP from HHPT, seated heel slides, SLR with quad set, hamstring / hip flexor stretching,    Consulted and Agree with Plan of Care Patient      Patient will benefit from skilled therapeutic intervention in order to improve the following deficits and impairments:  Abnormal gait, Pain, Improper body mechanics, Postural dysfunction, Decreased strength, Increased edema, Decreased endurance, Decreased activity tolerance, Decreased balance, Decreased range of motion, Increased fascial restricitons  Visit Diagnosis: Other abnormalities of gait and mobility  Chronic pain of left knee  Stiffness of left knee, not elsewhere classified  Localized edema  Muscle weakness (generalized)     Problem List Patient Active Problem List   Diagnosis Date Noted  . Primary osteoarthritis of left knee 12/30/2016  . Acute low back pain without sciatica 11/10/2016  . Obesity 05/24/2016  . Hyperglycemia 02/24/2014  . Osteoarthritis of right knee 11/15/2013  . Osteoarthritis of left knee 11/15/2013  . Alkaline phosphatase elevation 01/02/2012  . Hyperlipidemia 01/02/2012  . Routine general medical examination at a health care facility 12/09/2011  . 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 POLYARTHRITIS 01/28/2010  . ANGIOEDEMA 03/27/2007  . Essential hypertension 03/19/2007  . Perennial allergic rhinitis with seasonal variation 03/19/2007  . Moderate intermittent asthma 03/19/2007  . GERD 03/19/2007  . HIATAL HERNIA 03/19/2007  . GESTATIONAL DIABETES 03/19/2007   Starr Lake PT, DPT, LAT, [name]  02/08/17  9:42 AM      [facility] [address] [place], [place], [address] Phone: [phone]   Fax:  [phone]  Name: Bettejane Leavens MRN:  [id number] Date of Birth: 01/12/1961

## 2017-02-13 ENCOUNTER — Ambulatory Visit: Payer: 59 | Admitting: Physical Therapy

## 2017-02-13 ENCOUNTER — Encounter: Payer: Self-pay | Admitting: Physical Therapy

## 2017-02-13 DIAGNOSIS — R600 Localized edema: Secondary | ICD-10-CM | POA: Diagnosis not present

## 2017-02-13 DIAGNOSIS — M1712 Unilateral primary osteoarthritis, left knee: Secondary | ICD-10-CM | POA: Diagnosis not present

## 2017-02-13 DIAGNOSIS — M6281 Muscle weakness (generalized): Secondary | ICD-10-CM | POA: Diagnosis not present

## 2017-02-13 DIAGNOSIS — M25662 Stiffness of left knee, not elsewhere classified: Secondary | ICD-10-CM

## 2017-02-13 DIAGNOSIS — R2689 Other abnormalities of gait and mobility: Secondary | ICD-10-CM

## 2017-02-13 DIAGNOSIS — G8929 Other chronic pain: Secondary | ICD-10-CM

## 2017-02-13 DIAGNOSIS — M25562 Pain in left knee: Secondary | ICD-10-CM

## 2017-02-13 NOTE — Therapy (Signed)
[place], [place], [address] Phone: [phone]   Fax:  [phone]  Physical Therapy Treatment  Patient Details  Name: Katherine Tanner MRN: [id number] Date of Birth: 1960-12-06 Referring Provider: Lowella Petties MD  Encounter Date: 02/13/2017      PT End of Session - 02/13/17 0937    Visit Number 7   Number of Visits 12   Date for PT Re-Evaluation 03/15/17   PT Start Time 0845   PT Stop Time 0943   PT Time Calculation (min) 58 min   Activity Tolerance Patient limited by lethargy   Behavior During Therapy [facility] for tasks assessed/performed      Past Medical History:  Diagnosis Date  . Allergy    takes Claritin daily and Flonase daily  . Arthritis   . Asthma    uses Albuterol daily as needed;Singulair nightly;DUlera daily  . GERD (gastroesophageal reflux disease)    occasionally will take OTC meds but states she just watches what she eats  . H/O hiatal hernia   . History of bronchitis    last time 58yr ago  . History of colon polyps   . History of kidney stones   . History of shingles   . Hypertension    takes Hyzaar daily  . Joint pain   . Joint swelling   . Pneumonia    hx of;last time at least 89yrago  . PONV (postoperative nausea and vomiting)     Past Surgical History:  Procedure Laterality Date  . BREAST EXCISIONAL BIOPSY Right   . CARDIAC CATHETERIZATION    . COLONOSCOPY    . ESOPHAGOGASTRODUODENOSCOPY    . KNEE SURGERY Right    arthroscopy  . Right ganglion cyst     x 2  . right lumpectomy  around 1983  . STERIOD INJECTION Left 11/15/2013   Procedure: Marcaine/STEROID INJECTION;  Surgeon: JoAlta CorningMD;  [place] [facility] [facility]: [place]opedics;  Laterality: Left;  . TOTAL KNEE ARTHROPLASTY Right 11/15/2013   Procedure: RIGHT TOTAL KNEE ARTHROPLASTY;  Surgeon: JoAlta CorningMD;  [place] [facility];  Laterality: Right;  . TOTAL KNEE  ARTHROPLASTY Left 12/30/2016   Procedure: TOTAL KNEE ARTHROPLASTY;  Surgeon: GrDorna LeitzMD;  [place] [facility] [place] [facility];  Laterality: Left;  . TUBAL LIGATION      There were no vitals filed for this visit.      Subjective Assessment - 02/13/17 0854    Subjective I'm ready to drive..  My scar is sore.  MD visit today.  I need to work on the straightening.   Currently in Pain? Yes   Pain Score 3    up to 4-5/10   Pain Location Knee   Pain Orientation Left   Pain Descriptors / Indicators Sore   Pain Type Surgical pain   Aggravating Factors  night pain,  distal incision,  exercise   Pain Relieving Factors Exercise, rest ice meds massage   Multiple Pain Sites --  2 /10 right knee                         [facility] Adult PT Treatment/Exercise - 02/13/17 0001      Knee/Hip Exercises: Standing   Other Standing Knee Exercises wall slides 5-10 X each foot weightbearing , facing wall      Knee/Hip Exercises: Seated   Long Arc Quad 1 set;10 reps   Long  Arc Quad Limitations 10 second holse. slow lower     Knee/Hip Exercises: Supine   Quad Sets 20 reps   Quad Sets Limitations small towel roll, cued to not use glutes and focus anterior knee, sore.                   PT Short Term Goals - 02/13/17 0102      PT SHORT TERM GOAL #1   Title pt to be I with inital HEP (02/18/2017)   Baseline independent with exercises added so far.    Time 4   Period Weeks   Status On-going     PT SHORT TERM GOAL #2   Title pt to increase L knee flexion to >/= 110 degrees and extension to </=-10 degrees to promote functional mobility (02/18/2017)   Baseline Extension after exercise+  5 degrees to  120    Flexion. AAROM   Time 4   Period Weeks   Status Partially Met     PT SHORT TERM GOAL #3   Title decrease L knee edema by >/= 3 cm globally to reduce pain and promote knee mobility (02/18/2017)   Baseline 47 cm mid patella   Time 4   Period Weeks     PT SHORT  TERM GOAL #4   Title pt to ambulate >/= 150 ft without AD and navigate up/ down 10 steps with 1 HHA for safety reciprocally (02/18/2017)   Baseline reciprocal not yet consistant on steps, 2 rails  Able to walk short distances without cane   Time 4   Period Weeks   Status On-going           PT Long Term Goals - 01/18/17 1553      PT LONG TERM GOAL #1   Title pt to increase L knee mobility to -3 to 120 degrees for functional mobility required for efficient gait pattern (03/15/2017)   Time 8   Period Weeks   Status New     PT LONG TERM GOAL #2   Title Increase L knee strength to >/= 4+/5 to promote knee stability with prolonged walking and standing activities (03/15/2017)   Time 8   Period Weeks   Status New     PT LONG TERM GOAL #3   Title pt will be able to stand/ walk >/= 1 hour and navigate >/= 20 steps reciprocally with </= 1 HHA for safety PRN for functional endurance required for work activities and tasks (03/15/2017)   Time 8   Period Weeks   Status New     PT LONG TERM GOAL #4   Title pt will increase FOTO score to </= 43% limited to demo improvement in function (03/15/2017)   Time 8   Period Weeks   Status New     PT LONG TERM GOAL #5   Title pt to be I with all HEP given as of last visit (03/15/2017)   Time 8   Period Weeks   Status New               Plan - 02/13/17 7253    Clinical Impression Statement -5 to 120 AAROM.   Progress toward strength and edema goals.  Patient able to step over step a few steps.    PT Treatment/Interventions ADLs/Self Care Home Management;Cryotherapy;Electrical Stimulation;Iontophoresis 28m/ml Dexamethasone;Moist Heat;Therapeutic activities;Therapeutic exercise;Passive range of motion;Vasopneumatic Device;Manual techniques;Neuromuscular re-education;Patient/family education;Gait training;Stair training;Ultrasound   PT Next Visit Plan update HEP PRN,  bike vs Nu-step, knee mobs,  quad/ hip strengthening, gait training, vaso for pain  and swelling, gait training, resisted gait, measurements for MD visit   Consulted and Agree with Plan of Care Patient      Patient will benefit from skilled therapeutic intervention in order to improve the following deficits and impairments:     Visit Diagnosis: Other abnormalities of gait and mobility  Chronic pain of left knee  Stiffness of left knee, not elsewhere classified  Localized edema  Muscle weakness (generalized)     Problem List Patient Active Problem List   Diagnosis Date Noted  . Primary osteoarthritis of left knee 12/30/2016  . Acute low back pain without sciatica 11/10/2016  . Obesity 05/24/2016  . Hyperglycemia 02/24/2014  . Osteoarthritis of right knee 11/15/2013  . Osteoarthritis of left knee 11/15/2013  . Alkaline phosphatase elevation 01/02/2012  . Hyperlipidemia 01/02/2012  . Routine general medical examination at a health care facility 12/09/2011  . 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 POLYARTHRITIS 01/28/2010  . ANGIOEDEMA 03/27/2007  . Essential hypertension 03/19/2007  . Perennial allergic rhinitis with seasonal variation 03/19/2007  . Moderate intermittent asthma 03/19/2007  . GERD 03/19/2007  . HIATAL HERNIA 03/19/2007  . GESTATIONAL DIABETES 03/19/2007     Lene Mckay PTA 02/13/2017, 9:45 AM  [facility] [address] [place], [place], [address] Phone: [phone]   Fax:  [phone]  Name: Katherine Tanner MRN: [id number] Date of Birth: Jun 24, 1961

## 2017-02-16 ENCOUNTER — Encounter: Payer: Self-pay | Admitting: Physical Therapy

## 2017-02-16 ENCOUNTER — Ambulatory Visit: Payer: 59 | Admitting: Physical Therapy

## 2017-02-16 DIAGNOSIS — R600 Localized edema: Secondary | ICD-10-CM

## 2017-02-16 DIAGNOSIS — M25662 Stiffness of left knee, not elsewhere classified: Secondary | ICD-10-CM | POA: Diagnosis not present

## 2017-02-16 DIAGNOSIS — G8929 Other chronic pain: Secondary | ICD-10-CM | POA: Diagnosis not present

## 2017-02-16 DIAGNOSIS — R2689 Other abnormalities of gait and mobility: Secondary | ICD-10-CM

## 2017-02-16 DIAGNOSIS — M25562 Pain in left knee: Secondary | ICD-10-CM

## 2017-02-16 DIAGNOSIS — M6281 Muscle weakness (generalized): Secondary | ICD-10-CM | POA: Diagnosis not present

## 2017-02-16 NOTE — Therapy (Signed)
[facility] [address] [place], [place], [address] Phone: [phone]   Fax:  [phone]  Physical Therapy Treatment  Patient Details  Name: Katherine Tanner MRN: [id number] Date of Birth: Jun 09, 1961 Referring Provider: Milly Jakob MD  Encounter Date: 02/16/2017      PT End of Session - 02/16/17 0927    Visit Number 8   Number of Visits 12   Date for PT Re-Evaluation 03/15/17   PT Start Time 0846   PT Stop Time 0943   PT Time Calculation (min) 57 min   Activity Tolerance Patient tolerated treatment well   Behavior During Therapy [facility] for tasks assessed/performed      Past Medical History:  Diagnosis Date  . Allergy    takes Claritin daily and Flonase daily  . Arthritis   . Asthma    uses Albuterol daily as needed;Singulair nightly;DUlera daily  . GERD (gastroesophageal reflux disease)    occasionally will take OTC meds but states she just watches what she eats  . H/O hiatal hernia   . History of bronchitis    last time 42yrs ago  . History of colon polyps   . History of kidney stones   . History of shingles   . Hypertension    takes Hyzaar daily  . Joint pain   . Joint swelling   . Pneumonia    hx of;last time at least 28yrs ago  . PONV (postoperative nausea and vomiting)     Past Surgical History:  Procedure Laterality Date  . BREAST EXCISIONAL BIOPSY Right   . CARDIAC CATHETERIZATION    . COLONOSCOPY    . ESOPHAGOGASTRODUODENOSCOPY    . KNEE SURGERY Right    arthroscopy  . Right ganglion cyst     x 2  . right lumpectomy  around 1983  . STERIOD INJECTION Left 11/15/2013   Procedure: Marcaine/STEROID INJECTION;  Surgeon: Harvie Junior, MD;  Location: [facility];  Service: Orthopedics;  Laterality: Left;  . TOTAL KNEE ARTHROPLASTY Right 11/15/2013   Procedure: RIGHT TOTAL KNEE ARTHROPLASTY;  Surgeon: Harvie Junior, MD;  Location: [facility];  Service: Orthopedics;  Laterality: Right;  . TOTAL KNEE  ARTHROPLASTY Left 12/30/2016   Procedure: TOTAL KNEE ARTHROPLASTY;  Surgeon: Jodi Geralds, MD;  Location: [facility];  Service: Orthopedics;  Laterality: Left;  . TUBAL LIGATION      There were no vitals filed for this visit.                       [facility] Adult PT Treatment/Exercise - 02/16/17 0001      Knee/Hip Exercises: Aerobic   Stationary Bike L1 5 minutes .08 mile     Knee/Hip Exercises: Standing   Terminal Knee Extension Limitations 10 X ball press into wall,  challanging with weightbearing.   Functional Squat 1 set;10 reps   Wall Squat 1 set;10 reps   SLS with Vectors swing leg Opposite, forward/reverse,  Abduction 5 X 2 sets Cga    Gait Training 12 steps with both rails 4 and 6 inches, step over step     Knee/Hip Exercises: Seated   Long Arc Quad Limitations 10   Stool Scoot - Round Trips 40 feet,  fatiugued    Hamstring Curl 2 sets;10 reps  green band     Knee/Hip Exercises: Supine   Quad Sets 5 reps   Quad Sets Limitations poor quality of set (end of session?)  4/10 pain with this  Heel Slides 10 reps   Bridges Limitations 10   Straight Leg Raises 20 reps   Patellar Mobs checked     Vasopneumatic   Number Minutes Vasopneumatic  15 minutes   Vasopnuematic Location  Knee   Vasopneumatic Pressure Medium   Vasopneumatic Temperature  34                  PT Short Term Goals - 02/13/17 0177      PT SHORT TERM GOAL #1   Title pt to be I with inital HEP (02/18/2017)   Baseline independent with exercises added so far.    Time 4   Period Weeks   Status On-going     PT SHORT TERM GOAL #2   Title pt to increase L knee flexion to >/= 110 degrees and extension to </=-10 degrees to promote functional mobility (02/18/2017)   Baseline Extension after exercise+  5 degrees to  120    Flexion. AAROM   Time 4   Period Weeks   Status Partially Met     PT SHORT TERM GOAL #3   Title decrease L knee edema by >/= 3 cm globally to reduce pain and promote  knee mobility (02/18/2017)   Baseline 47 cm mid patella   Time 4   Period Weeks     PT SHORT TERM GOAL #4   Title pt to ambulate >/= 150 ft without AD and navigate up/ down 10 steps with 1 HHA for safety reciprocally (02/18/2017)   Baseline reciprocal not yet consistant on steps, 2 rails  Able to walk short distances without cane   Time 4   Period Weeks   Status On-going           PT Long Term Goals - 01/18/17 1553      PT LONG TERM GOAL #1   Title pt to increase L knee mobility to -3 to 120 degrees for functional mobility required for efficient gait pattern (03/15/2017)   Time 8   Period Weeks   Status New     PT LONG TERM GOAL #2   Title Increase L knee strength to >/= 4+/5 to promote knee stability with prolonged walking and standing activities (03/15/2017)   Time 8   Period Weeks   Status New     PT LONG TERM GOAL #3   Title pt will be able to stand/ walk >/= 1 hour and navigate >/= 20 steps reciprocally with </= 1 HHA for safety PRN for functional endurance required for work activities and tasks (03/15/2017)   Time 8   Period Weeks   Status New     PT LONG TERM GOAL #4   Title pt will increase FOTO score to </= 43% limited to demo improvement in function (03/15/2017)   Time 8   Period Weeks   Status New     PT LONG TERM GOAL #5   Title pt to be I with all HEP given as of last visit (03/15/2017)   Time 8   Period Weeks   Status New               Plan - 02/16/17 9390    Clinical Impression Statement -5 to 212 AROM post session.  MD happy with progress.  Able to go step over step with 2 rails.  Pain 4/10 post session.   PT Treatment/Interventions ADLs/Self Care Home Management;Cryotherapy;Electrical Stimulation;Iontophoresis '4mg'$ /ml Dexamethasone;Moist Heat;Therapeutic activities;Therapeutic exercise;Passive range of motion;Vasopneumatic Device;Manual techniques;Neuromuscular re-education;Patient/family education;Gait training;Stair training;Ultrasound  PT Next  Visit Plan update HEP PRN,  bike vs Nu-step, knee mobs, quad/ hip strengthening, gait training, vaso for pain and swelling, gait training, resisted gait,  FOTO?   PT Home Exercise Plan Up date  HEP, seated heel slides, SLR with quad set, hamstring / hip flexor stretching,    Consulted and Agree with Plan of Care Patient      Patient will benefit from skilled therapeutic intervention in order to improve the following deficits and impairments:  Abnormal gait, Pain, Improper body mechanics, Postural dysfunction, Decreased strength, Increased edema, Decreased endurance, Decreased activity tolerance, Decreased balance, Decreased range of motion, Increased fascial restricitons  Visit Diagnosis: Other abnormalities of gait and mobility  Chronic pain of left knee  Stiffness of left knee, not elsewhere classified  Localized edema  Muscle weakness (generalized)     Problem List Patient Active Problem List   Diagnosis Date Noted  . Primary osteoarthritis of left knee 12/30/2016  . Acute low back pain without sciatica 11/10/2016  . Obesity 05/24/2016  . Hyperglycemia 02/24/2014  . Osteoarthritis of right knee 11/15/2013  . Osteoarthritis of left knee 11/15/2013  . Alkaline phosphatase elevation 01/02/2012  . Hyperlipidemia 01/02/2012  . Routine general medical examination at a health care facility 12/09/2011  . 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 POLYARTHRITIS 01/28/2010  . ANGIOEDEMA 03/27/2007  . Essential hypertension 03/19/2007  . Perennial allergic rhinitis with seasonal variation 03/19/2007  . Moderate intermittent asthma 03/19/2007  . GERD 03/19/2007  . HIATAL HERNIA 03/19/2007  . GESTATIONAL DIABETES 03/19/2007    HARRIS,KAREN PTA 02/16/2017, 9:29 AM  [facility] [address] [place], [place], [address] Phone: [phone]   Fax:  [phone]  Name: Angelli Baruch MRN: [id number] Date of Birth:  June 27, 1961

## 2017-02-20 ENCOUNTER — Ambulatory Visit: Payer: 59 | Admitting: Physical Therapy

## 2017-02-20 ENCOUNTER — Encounter: Payer: Self-pay | Admitting: Physical Therapy

## 2017-02-20 DIAGNOSIS — R2689 Other abnormalities of gait and mobility: Secondary | ICD-10-CM

## 2017-02-20 DIAGNOSIS — G8929 Other chronic pain: Secondary | ICD-10-CM

## 2017-02-20 DIAGNOSIS — M6281 Muscle weakness (generalized): Secondary | ICD-10-CM

## 2017-02-20 DIAGNOSIS — M25562 Pain in left knee: Secondary | ICD-10-CM

## 2017-02-20 DIAGNOSIS — R600 Localized edema: Secondary | ICD-10-CM | POA: Diagnosis not present

## 2017-02-20 DIAGNOSIS — M25662 Stiffness of left knee, not elsewhere classified: Secondary | ICD-10-CM

## 2017-02-20 NOTE — Therapy (Signed)
[place], [place], [address] Phone: [phone]   Fax:  [phone]  Physical Therapy Treatment  Patient Details  Name: Katherine Tanner MRN: [id number] Date of Birth: December 24, 1960 Referring Provider: Lowella Petties MD  Encounter Date: 02/20/2017      PT End of Session - 02/20/17 0933    Visit Number 9   Number of Visits 12   Date for PT Re-Evaluation 03/15/17   PT Start Time 4287   PT Stop Time 0944   PT Time Calculation (min) 57 min   Activity Tolerance Patient tolerated treatment well   Behavior During Therapy [facility] for tasks assessed/performed      Past Medical History:  Diagnosis Date  . Allergy    takes Claritin daily and Flonase daily  . Arthritis   . Asthma    uses Albuterol daily as needed;Singulair nightly;DUlera daily  . GERD (gastroesophageal reflux disease)    occasionally will take OTC meds but states she just watches what she eats  . H/O hiatal hernia   . History of bronchitis    last time 51yrs ago  . History of colon polyps   . History of kidney stones   . History of shingles   . Hypertension    takes Hyzaar daily  . Joint pain   . Joint swelling   . Pneumonia    hx of;last time at least 108yrs ago  . PONV (postoperative nausea and vomiting)     Past Surgical History:  Procedure Laterality Date  . BREAST EXCISIONAL BIOPSY Right   . CARDIAC CATHETERIZATION    . COLONOSCOPY    . ESOPHAGOGASTRODUODENOSCOPY    . KNEE SURGERY Right    arthroscopy  . Right ganglion cyst     x 2  . right lumpectomy  around 1983  . STERIOD INJECTION Left 11/15/2013   Procedure: Marcaine/STEROID INJECTION;  Surgeon: Alta Corning, MD;  Location: [place];  Service: Orthopedics;  Laterality: Left;  . TOTAL KNEE ARTHROPLASTY Right 11/15/2013   Procedure: RIGHT TOTAL KNEE ARTHROPLASTY;  Surgeon: Alta Corning, MD;  Location: [place];  Service: Orthopedics;  Laterality: Right;  . TOTAL KNEE  ARTHROPLASTY Left 12/30/2016   Procedure: TOTAL KNEE ARTHROPLASTY;  Surgeon: Dorna Leitz, MD;  Location: [place];  Service: Orthopedics;  Laterality: Left;  . TUBAL LIGATION      There were no vitals filed for this visit.      Subjective Assessment - 02/20/17 0853    Subjective Patient has been doing her exercises.  Theodis Sato is OK.  She uses cane in am and at night .   Uses cane in the community.    Currently in Pain? Yes   Pain Score 4    Pain Location Knee   Pain Orientation Left   Pain Descriptors / Indicators Sore;Shooting   Pain Type Surgical pain   Pain Frequency Intermittent   Aggravating Factors  night pain,  not sleeping well.    Pain Relieving Factors ace bandage,  meds,  massage,  TENS                         [facility] Adult PT Treatment/Exercise - 02/20/17 0001      Knee/Hip Exercises: Stretches   Gastroc Stretch 3 reps;30 seconds   Gastroc Stretch Limitations slant board.     Knee/Hip Exercises: Aerobic   Recumbent Bike 7 minutes L1 1 mile.      Knee/Hip  Exercises: Standing   Heel Raises 10 reps   Heel Raises Limitations difficult, both HEP   Side Lunges Limitations mini squat 2 steps right/lrft 5 sets fatige   Forward Step Up 2 sets;10 reps;Hand Hold: 0;Step Height: 6"   Forward Step Up Limitations SBA close   SLS 2 seconds max.  SBA  HEP     Knee/Hip Exercises: Seated   Hamstring Curl 3 sets;10 reps  red band     Knee/Hip Exercises: Supine   Quad Sets 10 reps   Quad Sets Limitations painful 4/10   Straight Leg Raises 10 reps     Cryotherapy   Number Minutes Cryotherapy 10 Minutes   Cryotherapy Location Knee   Type of Cryotherapy --  cold pack     Manual Therapy   Manual therapy comments distraction.  PROM into extension extension mobs with foam roller.                 PT Education - 02/20/17 0930    Education provided Yes   Education Details HEP   Person(s) Educated Patient   Methods Explanation;Demonstration;Handout    Comprehension Verbalized understanding;Returned demonstration          PT Short Term Goals - 02/20/17 1403      PT SHORT TERM GOAL #1   Title pt to be I with inital HEP (02/18/2017)   Baseline independent with exercises added so far.    Time 4   Period Weeks   Status On-going     PT SHORT TERM GOAL #2   Title pt to increase L knee flexion to >/= 110 degrees and extension to </=-10 degrees to promote functional mobility (02/18/2017)   Time 4   Period Weeks   Status Unable to assess     PT SHORT TERM GOAL #3   Title decrease L knee edema by >/= 3 cm globally to reduce pain and promote knee mobility (02/18/2017)   Baseline visably decreased   Time 4   Status Unable to assess     PT SHORT TERM GOAL #4   Title pt to ambulate >/= 150 ft without AD and navigate up/ down 10 steps with 1 HHA for safety reciprocally (02/18/2017)   Baseline reciprocal not yet consistant on steps, 2 rails  Uses cane in community and inside in am and PM   Time 4   Period Weeks   Status On-going           PT Long Term Goals - 01/18/17 1553      PT LONG TERM GOAL #1   Title pt to increase L knee mobility to -3 to 120 degrees for functional mobility required for efficient gait pattern (03/15/2017)   Time 8   Period Weeks   Status New     PT LONG TERM GOAL #2   Title Increase L knee strength to >/= 4+/5 to promote knee stability with prolonged walking and standing activities (03/15/2017)   Time 8   Period Weeks   Status New     PT LONG TERM GOAL #3   Title pt will be able to stand/ walk >/= 1 hour and navigate >/= 20 steps reciprocally with </= 1 HHA for safety PRN for functional endurance required for work activities and tasks (03/15/2017)   Time 8   Period Weeks   Status New     PT LONG TERM GOAL #4   Title pt will increase FOTO score to </= 43% limited to demo improvement in  function (03/15/2017)   Time 8   Period Weeks   Status New     PT LONG TERM GOAL #5   Title pt to be I with all HEP  given as of last visit (03/15/2017)   Time 8   Period Weeks   Status New               Plan - 02/20/17 1402    Clinical Impression Statement Pain 4/10 post exercise.  Medial knee pain increases with terminal knee exercises.  Extension continues at -5.  Quad strength improving with patient being more functional.     PT Treatment/Interventions ADLs/Self Care Home Management;Cryotherapy;Electrical Stimulation;Iontophoresis 4mg /ml Dexamethasone;Moist Heat;Therapeutic activities;Therapeutic exercise;Passive range of motion;Vasopneumatic Device;Manual techniques;Neuromuscular re-education;Patient/family education;Gait training;Stair training;Ultrasound   PT Next Visit Plan update HEP PRN,  bike vs Nu-step, knee mobs, quad/ hip strengthening, gait training, vaso for pain and swelling, gait training, resisted gait,  FOTO?   PT Home Exercise Plan Up date  HEP, seated heel slides, SLR with quad set, hamstring / hip flexor stretching,    Consulted and Agree with Plan of Care Patient      Patient will benefit from skilled therapeutic intervention in order to improve the following deficits and impairments:  Abnormal gait, Pain, Improper body mechanics, Postural dysfunction, Decreased strength, Increased edema, Decreased endurance, Decreased activity tolerance, Decreased balance, Decreased range of motion, Increased fascial restricitons  Visit Diagnosis: Other abnormalities of gait and mobility  Chronic pain of left knee  Stiffness of left knee, not elsewhere classified  Localized edema  Muscle weakness (generalized)     Problem List Patient Active Problem List   Diagnosis Date Noted  . Primary osteoarthritis of left knee 12/30/2016  . Acute low back pain without sciatica 11/10/2016  . Obesity 05/24/2016  . Hyperglycemia 02/24/2014  . Osteoarthritis of right knee 11/15/2013  . Osteoarthritis of left knee 11/15/2013  . Alkaline phosphatase elevation 01/02/2012  . Hyperlipidemia  01/02/2012  . Routine general medical examination at a health care facility 12/09/2011  . 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 POLYARTHRITIS 01/28/2010  . ANGIOEDEMA 03/27/2007  . Essential hypertension 03/19/2007  . Perennial allergic rhinitis with seasonal variation 03/19/2007  . Moderate intermittent asthma 03/19/2007  . GERD 03/19/2007  . HIATAL HERNIA 03/19/2007  . GESTATIONAL DIABETES 03/19/2007    Nigeria Lasseter PTA 02/20/2017, 2:05 PM  [facility] [address] [place], [place], [address] Phone: [phone]   Fax:  [phone]  Name: Katherine Tanner MRN: [id number] Date of Birth: Jan 05, 1961

## 2017-02-20 NOTE — Patient Instructions (Signed)
Heel lifts and SLS 1-3 X a day 10 X each Issued from exercise drawer

## 2017-02-23 ENCOUNTER — Encounter: Payer: Self-pay | Admitting: Physical Therapy

## 2017-02-23 ENCOUNTER — Ambulatory Visit: Payer: 59 | Admitting: Physical Therapy

## 2017-02-23 DIAGNOSIS — R600 Localized edema: Secondary | ICD-10-CM

## 2017-02-23 DIAGNOSIS — M25662 Stiffness of left knee, not elsewhere classified: Secondary | ICD-10-CM | POA: Diagnosis not present

## 2017-02-23 DIAGNOSIS — G8929 Other chronic pain: Secondary | ICD-10-CM | POA: Diagnosis not present

## 2017-02-23 DIAGNOSIS — M25562 Pain in left knee: Secondary | ICD-10-CM | POA: Diagnosis not present

## 2017-02-23 DIAGNOSIS — R2689 Other abnormalities of gait and mobility: Secondary | ICD-10-CM | POA: Diagnosis not present

## 2017-02-23 DIAGNOSIS — M6281 Muscle weakness (generalized): Secondary | ICD-10-CM | POA: Diagnosis not present

## 2017-02-23 NOTE — Therapy (Signed)
[place], [place], [address] Phone: [phone]   Fax:  [phone]  Physical Therapy Treatment  Patient Details  Name: Lawson Isabell MRN: [id number] Date of Birth: 06-23-1961 Referring Provider: Lowella Petties MD  Encounter Date: 02/23/2017      PT End of Session - 02/23/17 0932    Visit Number 10   Number of Visits 12   Date for PT Re-Evaluation 03/15/17   PT Start Time 0846   PT Stop Time 0931   PT Time Calculation (min) 45 min   Activity Tolerance Patient tolerated treatment well   Behavior During Therapy [facility] for tasks assessed/performed      Past Medical History:  Diagnosis Date  . Allergy    takes Claritin daily and Flonase daily  . Arthritis   . Asthma    uses Albuterol daily as needed;Singulair nightly;DUlera daily  . GERD (gastroesophageal reflux disease)    occasionally will take OTC meds but states she just watches what she eats  . H/O hiatal hernia   . History of bronchitis    last time 4yrs ago  . History of colon polyps   . History of kidney stones   . History of shingles   . Hypertension    takes Hyzaar daily  . Joint pain   . Joint swelling   . Pneumonia    hx of;last time at least 54yrs ago  . PONV (postoperative nausea and vomiting)     Past Surgical History:  Procedure Laterality Date  . BREAST EXCISIONAL BIOPSY Right   . CARDIAC CATHETERIZATION    . COLONOSCOPY    . ESOPHAGOGASTRODUODENOSCOPY    . KNEE SURGERY Right    arthroscopy  . Right ganglion cyst     x 2  . right lumpectomy  around 1983  . STERIOD INJECTION Left 11/15/2013   Procedure: Marcaine/STEROID INJECTION;  Surgeon: Alta Corning, MD;  Location: [place];  Service: Orthopedics;  Laterality: Left;  . TOTAL KNEE ARTHROPLASTY Right 11/15/2013   Procedure: RIGHT TOTAL KNEE ARTHROPLASTY;  Surgeon: Alta Corning, MD;  Location: [place];  Service: Orthopedics;  Laterality: Right;  . TOTAL KNEE  ARTHROPLASTY Left 12/30/2016   Procedure: TOTAL KNEE ARTHROPLASTY;  Surgeon: Dorna Leitz, MD;  Location: [place];  Service: Orthopedics;  Laterality: Left;  . TUBAL LIGATION      There were no vitals filed for this visit.      Subjective Assessment - 02/23/17 0849    Subjective "I am doing pretty good, I am having some soreness and some difficulty with balance when I dont use my cane"    Currently in Pain? Yes   Pain Score 2    Pain Orientation Left   Pain Descriptors / Indicators Aching;Sore   Pain Type Surgical pain   Pain Onset More than a month ago   Pain Frequency Intermittent            [facility] PT Assessment - 02/23/17 0925      AROM   Left Knee Extension -8                     [facility] Adult PT Treatment/Exercise - 02/23/17 0900      Knee/Hip Exercises: Stretches   Active Hamstring Stretch 2 reps;30 seconds   Hip Flexor Stretch 2 reps;30 seconds   Gastroc Stretch 3 reps;30 seconds   Gastroc Stretch Limitations slant board.     Knee/Hip Exercises: Aerobic  Recumbent Bike 8 min L3   lowering seat every 2 min to increase flexion     Knee/Hip Exercises: Standing   Step Down 2 sets;10 reps;Step Height: 6"     Manual Therapy   Joint Mobilization seated grade 3-4 with foot ER to promote screw home mechanism              Balance Exercises - 02/23/17 0931      Balance Exercises: Standing   Standing Eyes Opened Narrow base of support (BOS);3 reps;30 secs   Standing Eyes Closed 3 reps;30 secs;Narrow base of support (BOS)   Tandem Stance Eyes open;4 reps;30 secs  alternating lead foot feet           PT Education - 02/23/17 0931    Education provided Yes   Education Details updated HEP for balance in corner   Person(s) Educated Patient   Methods Explanation;Verbal cues;Handout   Comprehension Verbalized understanding;Verbal cues required          PT Short Term Goals - 02/20/17 1403      PT SHORT TERM GOAL #1   Title pt to be I with  inital HEP (02/18/2017)   Baseline independent with exercises added so far.    Time 4   Period Weeks   Status On-going     PT SHORT TERM GOAL #2   Title pt to increase L knee flexion to >/= 110 degrees and extension to </=-10 degrees to promote functional mobility (02/18/2017)   Time 4   Period Weeks   Status Unable to assess     PT SHORT TERM GOAL #3   Title decrease L knee edema by >/= 3 cm globally to reduce pain and promote knee mobility (02/18/2017)   Baseline visably decreased   Time 4   Status Unable to assess     PT SHORT TERM GOAL #4   Title pt to ambulate >/= 150 ft without AD and navigate up/ down 10 steps with 1 HHA for safety reciprocally (02/18/2017)   Baseline reciprocal not yet consistant on steps, 2 rails  Uses cane in community and inside in am and PM   Time 4   Period Weeks   Status On-going           PT Long Term Goals - 01/18/17 1553      PT LONG TERM GOAL #1   Title pt to increase L knee mobility to -3 to 120 degrees for functional mobility required for efficient gait pattern (03/15/2017)   Time 8   Period Weeks   Status New     PT LONG TERM GOAL #2   Title Increase L knee strength to >/= 4+/5 to promote knee stability with prolonged walking and standing activities (03/15/2017)   Time 8   Period Weeks   Status New     PT LONG TERM GOAL #3   Title pt will be able to stand/ walk >/= 1 hour and navigate >/= 20 steps reciprocally with </= 1 HHA for safety PRN for functional endurance required for work activities and tasks (03/15/2017)   Time 8   Period Weeks   Status New     PT LONG TERM GOAL #4   Title pt will increase FOTO score to </= 43% limited to demo improvement in function (03/15/2017)   Time 8   Period Weeks   Status New     PT LONG TERM GOAL #5   Title pt to be I with all HEP given  as of last visit (03/15/2017)   Time 8   Period Weeks   Status New               Plan - 02/23/17 0932    Clinical Impression Statement pt continues  to improve with ROM and decreased pain. continued focuse on knee extension mobs and quad strengthening. began balance training which with narrow BOS which she demonstrated mod postural sway with tandem stance. updated HEP for corner balance training. She reported decreased pain post session and declined modalities.    PT Next Visit Plan update HEP PRN,  bike vs Nu-step, knee mobs, quad/ hip strengthening, gait training, vaso for pain and swelling, gait training, resisted gait,  FOTO?   PT Home Exercise Plan Up date  HEP, seated heel slides, SLR with quad set, hamstring / hip flexor stretching,    Consulted and Agree with Plan of Care Patient      Patient will benefit from skilled therapeutic intervention in order to improve the following deficits and impairments:  Abnormal gait, Pain, Improper body mechanics, Postural dysfunction, Decreased strength, Increased edema, Decreased endurance, Decreased activity tolerance, Decreased balance, Decreased range of motion, Increased fascial restricitons  Visit Diagnosis: Other abnormalities of gait and mobility  Chronic pain of left knee  Stiffness of left knee, not elsewhere classified  Localized edema  Muscle weakness (generalized)     Problem List Patient Active Problem List   Diagnosis Date Noted  . Primary osteoarthritis of left knee 12/30/2016  . Acute low back pain without sciatica 11/10/2016  . Obesity 05/24/2016  . Hyperglycemia 02/24/2014  . Osteoarthritis of right knee 11/15/2013  . Osteoarthritis of left knee 11/15/2013  . Alkaline phosphatase elevation 01/02/2012  . Hyperlipidemia 01/02/2012  . Routine general medical examination at a health care facility 12/09/2011  . 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 POLYARTHRITIS 01/28/2010  . ANGIOEDEMA 03/27/2007  . Essential hypertension 03/19/2007  . Perennial allergic rhinitis with seasonal variation 03/19/2007  . Moderate intermittent asthma 03/19/2007  . GERD 03/19/2007   . HIATAL HERNIA 03/19/2007  . GESTATIONAL DIABETES 03/19/2007   Starr Lake PT, DPT, LAT, [name]  02/23/17  9:34 AM      [facility] [address] [place], [place], [address] Phone: [phone]   Fax:  [phone]  Name: Teriyah Purington MRN: [id number] Date of Birth: [birth year]

## 2017-02-27 ENCOUNTER — Ambulatory Visit: Payer: 59 | Admitting: Physical Therapy

## 2017-02-27 ENCOUNTER — Encounter: Payer: Self-pay | Admitting: Physical Therapy

## 2017-02-27 DIAGNOSIS — M25662 Stiffness of left knee, not elsewhere classified: Secondary | ICD-10-CM

## 2017-02-27 DIAGNOSIS — R600 Localized edema: Secondary | ICD-10-CM | POA: Diagnosis not present

## 2017-02-27 DIAGNOSIS — R2689 Other abnormalities of gait and mobility: Secondary | ICD-10-CM

## 2017-02-27 DIAGNOSIS — M25562 Pain in left knee: Secondary | ICD-10-CM

## 2017-02-27 DIAGNOSIS — M6281 Muscle weakness (generalized): Secondary | ICD-10-CM

## 2017-02-27 DIAGNOSIS — G8929 Other chronic pain: Secondary | ICD-10-CM | POA: Diagnosis not present

## 2017-02-27 MED FILL — LOSARTAN-HCTZ 100-25 MG TAB: 100-25 | 90 days supply | Qty: 90 | Fill #3

## 2017-02-27 MED FILL — BREO ELLIPTA 100-25 MCG INH: 100-25 | 30 days supply | Qty: 60 | Fill #9

## 2017-02-27 NOTE — Patient Instructions (Signed)
Knee Band, green. 1-2 x a day 30 X Hamstring curl.  Issued from exercise drawer.

## 2017-02-27 NOTE — Therapy (Signed)
[place], [place], [address] Phone: [phone]   Fax:  [phone]  Physical Therapy Treatment  Patient Details  Name: Katherine Tanner MRN: [id number] Date of Birth: 1961-05-04 Referring Provider: Lowella Petties MD  Encounter Date: 02/27/2017      PT End of Session - 02/27/17 1013    Visit Number 11   Number of Visits 12   Date for PT Re-Evaluation 03/15/17   PT Start Time 0847   PT Stop Time 0947   PT Time Calculation (min) 60 min   Activity Tolerance Patient tolerated treatment well   Behavior During Therapy [facility] for tasks assessed/performed      Past Medical History:  Diagnosis Date  . Allergy    takes Claritin daily and Flonase daily  . Arthritis   . Asthma    uses Albuterol daily as needed;Singulair nightly;DUlera daily  . GERD (gastroesophageal reflux disease)    occasionally will take OTC meds but states she just watches what she eats  . H/O hiatal hernia   . History of bronchitis    last time 60yrs ago  . History of colon polyps   . History of kidney stones   . History of shingles   . Hypertension    takes Hyzaar daily  . Joint pain   . Joint swelling   . Pneumonia    hx of;last time at least 89yrs ago  . PONV (postoperative nausea and vomiting)     Past Surgical History:  Procedure Laterality Date  . BREAST EXCISIONAL BIOPSY Right   . CARDIAC CATHETERIZATION    . COLONOSCOPY    . ESOPHAGOGASTRODUODENOSCOPY    . KNEE SURGERY Right    arthroscopy  . Right ganglion cyst     x 2  . right lumpectomy  around 1983  . STERIOD INJECTION Left 11/15/2013   Procedure: Marcaine/STEROID INJECTION;  Surgeon: Alta Corning, MD;  Location: [place];  Service: Orthopedics;  Laterality: Left;  . TOTAL KNEE ARTHROPLASTY Right 11/15/2013   Procedure: RIGHT TOTAL KNEE ARTHROPLASTY;  Surgeon: Alta Corning, MD;  Location: [place];  Service: Orthopedics;  Laterality: Right;  . TOTAL KNEE  ARTHROPLASTY Left 12/30/2016   Procedure: TOTAL KNEE ARTHROPLASTY;  Surgeon: Dorna Leitz, MD;  Location: [place];  Service: Orthopedics;  Laterality: Left;  . TUBAL LIGATION      There were no vitals filed for this visit.      Subjective Assessment - 02/27/17 0855    Subjective Friday night she went to "Movie in the [place] and sat in a canvas chair"  Later on that evening pain returned distal knee.  She is not sleeping well.  She is able to exercises.    Currently in Pain? Yes   Pain Score 4    Pain Location Knee   Pain Descriptors / Indicators Aching;Sore   Pain Type Surgical pain   Aggravating Factors  not sure,   increased 2 hours after she got home from the movies.  It was stiff initially.     Pain Relieving Factors Ice. meds,  cane for pain   Effect of Pain on Daily Activities Went back to using the cane   Multiple Pain Sites No                         [facility] Adult PT Treatment/Exercise - 02/27/17 0001      High Level Balance   High Level Balance  Comments single leg with moving right leg forward 10 X minor use of hands for balance.  painful lateral distal nedd with full weightbearing. sitting rest needed.   static and dynamic with arm and head movements. challanging     Knee/Hip Exercises: Stretches   Gastroc Stretch 30 seconds   Gastroc Stretch Limitations slant board     Knee/Hip Exercises: Aerobic   Recumbent Bike 1 mile,  L1 due to increased pain this morning.   6 min 14 seconds.     Knee/Hip Exercises: Standing   Heel Raises 10 reps   Forward Step Up 1 set;10 reps   Step Down 2 sets;5 reps   Step Down Limitations rest needed,  unable to control eccentric.     Knee/Hip Exercises: Seated   Long Arc Quad 3 sets   Long Arc Quad Weight 5 lbs.   Long [employer] Limitations 10 second hold   Hamstring Curl 3 sets   Hamstring Limitations green band,  HEP     Cryotherapy   Number Minutes Cryotherapy 10 Minutes   Cryotherapy Location Knee   Type of  Cryotherapy --  cold pack                PT Education - 02/27/17 1013    Education provided Yes   Education Details HEP   Person(s) Educated Patient   Methods Explanation;Verbal cues;Handout   Comprehension Verbalized understanding;Returned demonstration          PT Short Term Goals - 02/20/17 1403      PT SHORT TERM GOAL #1   Title pt to be I with inital HEP (02/18/2017)   Baseline independent with exercises added so far.    Time 4   Period Weeks   Status On-going     PT SHORT TERM GOAL #2   Title pt to increase L knee flexion to >/= 110 degrees and extension to </=-10 degrees to promote functional mobility (02/18/2017)   Time 4   Period Weeks   Status Unable to assess     PT SHORT TERM GOAL #3   Title decrease L knee edema by >/= 3 cm globally to reduce pain and promote knee mobility (02/18/2017)   Baseline visably decreased   Time 4   Status Unable to assess     PT SHORT TERM GOAL #4   Title pt to ambulate >/= 150 ft without AD and navigate up/ down 10 steps with 1 HHA for safety reciprocally (02/18/2017)   Baseline reciprocal not yet consistant on steps, 2 rails  Uses cane in community and inside in am and PM   Time 4   Period Weeks   Status On-going           PT Long Term Goals - 01/18/17 1553      PT LONG TERM GOAL #1   Title pt to increase L knee mobility to -3 to 120 degrees for functional mobility required for efficient gait pattern (03/15/2017)   Time 8   Period Weeks   Status New     PT LONG TERM GOAL #2   Title Increase L knee strength to >/= 4+/5 to promote knee stability with prolonged walking and standing activities (03/15/2017)   Time 8   Period Weeks   Status New     PT LONG TERM GOAL #3   Title pt will be able to stand/ walk >/= 1 hour and navigate >/= 20 steps reciprocally with </= 1 HHA for safety PRN for  functional endurance required for work activities and tasks (03/15/2017)   Time 8   Period Weeks   Status New     PT LONG  TERM GOAL #4   Title pt will increase FOTO score to </= 43% limited to demo improvement in function (03/15/2017)   Time 8   Period Weeks   Status New     PT LONG TERM GOAL #5   Title pt to be I with all HEP given as of last visit (03/15/2017)   Time 8   Period Weeks   Status New               Plan - 02/27/17 1014    Clinical Impression Statement 5/10 pain with exercise. SLS 5 seconds left max.  Balance traing was able to be progressed.     PT Next Visit Plan update HEP PRN,  bike vs Nu-step, knee mobs, quad/ hip strengthening, gait training, vaso for pain and swelling, gait training, resisted gait,  FOTO?   PT Home Exercise Plan Up date  HEP, seated heel slides, SLR with quad set, hamstring / hip flexor stretching, hamstroing band   Consulted and Agree with Plan of Care Patient      Patient will benefit from skilled therapeutic intervention in order to improve the following deficits and impairments:     Visit Diagnosis: Other abnormalities of gait and mobility  Chronic pain of left knee  Stiffness of left knee, not elsewhere classified  Localized edema  Muscle weakness (generalized)     Problem List Patient Active Problem List   Diagnosis Date Noted  . Primary osteoarthritis of left knee 12/30/2016  . Acute low back pain without sciatica 11/10/2016  . Obesity 05/24/2016  . Hyperglycemia 02/24/2014  . Osteoarthritis of right knee 11/15/2013  . Osteoarthritis of left knee 11/15/2013  . Alkaline phosphatase elevation 01/02/2012  . Hyperlipidemia 01/02/2012  . Routine general medical examination at a health care facility 12/09/2011  . 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 POLYARTHRITIS 01/28/2010  . ANGIOEDEMA 03/27/2007  . Essential hypertension 03/19/2007  . Perennial allergic rhinitis with seasonal variation 03/19/2007  . Moderate intermittent asthma 03/19/2007  . GERD 03/19/2007  . HIATAL HERNIA 03/19/2007  . GESTATIONAL DIABETES 03/19/2007     HARRIS,KAREN PTA 02/27/2017, 10:16 AM  [facility] [address] [place], [place], [address] Phone: [phone]   Fax:  [phone]  Name: Katherine Tanner MRN: [id number] Date of Birth: 04/23/1961

## 2017-03-02 ENCOUNTER — Ambulatory Visit: Payer: 59 | Admitting: Physical Therapy

## 2017-03-02 ENCOUNTER — Encounter: Payer: Self-pay | Admitting: Physical Therapy

## 2017-03-02 DIAGNOSIS — M25662 Stiffness of left knee, not elsewhere classified: Secondary | ICD-10-CM | POA: Diagnosis not present

## 2017-03-02 DIAGNOSIS — M25562 Pain in left knee: Secondary | ICD-10-CM | POA: Diagnosis not present

## 2017-03-02 DIAGNOSIS — G8929 Other chronic pain: Secondary | ICD-10-CM

## 2017-03-02 DIAGNOSIS — R2689 Other abnormalities of gait and mobility: Secondary | ICD-10-CM | POA: Diagnosis not present

## 2017-03-02 DIAGNOSIS — R600 Localized edema: Secondary | ICD-10-CM

## 2017-03-02 DIAGNOSIS — M6281 Muscle weakness (generalized): Secondary | ICD-10-CM | POA: Diagnosis not present

## 2017-03-02 NOTE — Therapy (Signed)
[place], [place], [address] Phone: [phone]   Fax:  [phone]  Physical Therapy Treatment / Re-certification  Patient Details  Name: Katherine Tanner MRN: [id number] Date of Birth: January 01, 1961 Referring Provider: Lowella Petties MD  Encounter Date: 03/02/2017      PT End of Session - 03/02/17 1000    Visit Number 12   Number of Visits 18   Date for PT Re-Evaluation 04/13/17   PT Start Time 0933   PT Stop Time 1016   PT Time Calculation (min) 43 min   Activity Tolerance Patient tolerated treatment well   Behavior During Therapy [facility] for tasks assessed/performed      Past Medical History:  Diagnosis Date  . Allergy    takes Claritin daily and Flonase daily  . Arthritis   . Asthma    uses Albuterol daily as needed;Singulair nightly;DUlera daily  . GERD (gastroesophageal reflux disease)    occasionally will take OTC meds but states she just watches what she eats  . H/O hiatal hernia   . History of bronchitis    last time 74yr ago  . History of colon polyps   . History of kidney stones   . History of shingles   . Hypertension    takes Hyzaar daily  . Joint pain   . Joint swelling   . Pneumonia    hx of;last time at least 89yrago  . PONV (postoperative nausea and vomiting)     Past Surgical History:  Procedure Laterality Date  . BREAST EXCISIONAL BIOPSY Right   . CARDIAC CATHETERIZATION    . COLONOSCOPY    . ESOPHAGOGASTRODUODENOSCOPY    . KNEE SURGERY Right    arthroscopy  . Right ganglion cyst     x 2  . right lumpectomy  around 1983  . STERIOD INJECTION Left 11/15/2013   Procedure: Marcaine/STEROID INJECTION;  Surgeon: JoAlta CorningMD;  [facility] Service: [facility]opedics;  Laterality: Left;  . TOTAL KNEE ARTHROPLASTY Right 11/15/2013   Procedure: RIGHT TOTAL KNEE ARTHROPLASTY;  Surgeon: JoAlta CorningMD;  [facility] Service: [facility]ics;  Laterality: Right;   . TOTAL KNEE ARTHROPLASTY Left 12/30/2016   Procedure: TOTAL KNEE ARTHROPLASTY;  Surgeon: GrDorna LeitzMD;  [facility] [facility] Service: [place]opedics;  Laterality: Left;  . TUBAL LIGATION      There were no vitals filed for this visit.      Subjective Assessment - 03/02/17 0939    Subjective "I am feeling alittle sore today at 3/10. I have been alittle sore in one spot in my knee the last friday"    Currently in Pain? Yes   Pain Score 3    Pain Location Knee   Pain Orientation Left   Pain Type Surgical pain   Pain Frequency Intermittent            [facility] PT Assessment - 03/02/17 0951      Observation/Other Assessments   Focus on Therapeutic Outcomes (FOTO)  50% limited     AROM   Left Knee Extension -4   Left Knee Flexion 123     Strength   Left Knee Flexion 4/5   Left Knee Extension 4/5                     [facility] - 03/02/17 0940      Knee/Hip Exercises: Stretches   Active Hamstring Stretch 2 reps;30  seconds     Knee/Hip Exercises: Aerobic   Recumbent Bike L3 x 8 min   lowering seat every 2 min for knee flexion     Knee/Hip Exercises: Standing   Gait Training 4 laps, 30 ft. without SPC, cues for heel strike and toe off to promote gait without AD     Knee/Hip Exercises: Seated   Long Arc Quad 2 sets;15 reps  2.5# with ball squeeze for VMO activation     Manual Therapy   Manual therapy comments manual trigger point release over the vastus laterals and DTM using roller             Balance Exercises - 03/02/17 1018      Balance Exercises: Standing   Standing Eyes Opened Narrow base of support (BOS);3 reps;30 secs   Standing Eyes Closed Narrow base of support (BOS);3 reps;30 secs   Tandem Stance Eyes open;Eyes closed;4 reps;30 secs           PT Education - 03/02/17 1019    Education provided Yes   Education Details pt progress regarding ROM, MMT, and FOTO.   Person(s) Educated Patient   Methods  Explanation;Verbal cues   Comprehension Verbalized understanding;Verbal cues required          PT Short Term Goals - 03/02/17 1022      PT SHORT TERM GOAL #1   Title pt to be I with inital HEP (02/18/2017)   Time 4   Period Weeks   Status Achieved     PT SHORT TERM GOAL #2   Title pt to increase L knee flexion to >/= 110 degrees and extension to </=-10 degrees to promote functional mobility (02/18/2017)   Time 4   Period Weeks   Status Achieved     PT SHORT TERM GOAL #3   Title decrease L knee edema by >/= 3 cm globally to reduce pain and promote knee mobility (02/18/2017)   Period Weeks   Status Achieved     PT SHORT TERM GOAL #4   Title pt to ambulate >/= 150 ft without AD and navigate up/ down 10 steps with 1 HHA for safety reciprocally (02/18/2017)   Period Weeks   Status Achieved           PT Long Term Goals - 03/02/17 1022      PT LONG TERM GOAL #1   Title pt to increase L knee mobility to -3 to 120 degrees for functional mobility required for efficient gait pattern (03/15/2017)   Baseline -4 to 123 degrees   Time 8   Period Weeks   Status Partially Met     PT LONG TERM GOAL #2   Title Increase L knee strength to >/= 4+/5 to promote knee stability with prolonged walking and standing activities (03/15/2017)   Time 8   Period Weeks   Status Partially Met     PT LONG TERM GOAL #3   Title pt will be able to stand/ walk >/= 1 hour and navigate >/= 20 steps reciprocally with </= 1 HHA for safety PRN for functional endurance required for work activities and tasks (03/15/2017)   Period Weeks   Status Partially Met     PT LONG TERM GOAL #4   Title pt will increase FOTO score to </= 43% limited to demo improvement in function (03/15/2017)   Time 8   Period Weeks   Status Partially Met     PT LONG TERM GOAL #5   Title pt  to be I with all HEP given as of last visit (03/15/2017)   Time 8   Period Weeks   Status On-going               Plan - 03/02/17 1020     Clinical Impression Statement pt continues to make great progress with physical therapy. L knee mobility -4 to 123 degrees with mild soreness noted in the anterolateral aspect of the knee. decreased pain and itghtness with soft tissue work. continued working on gait without AD. She is progressing well with goals. she would benefit from physical therapy to decrease pain, promote independent exercise and meet remaining goals.    Rehab Potential Good   PT Frequency 1x / week   PT Duration 6 weeks   PT Treatment/Interventions ADLs/Self Care Home Management;Cryotherapy;Electrical Stimulation;Iontophoresis 55m/ml Dexamethasone;Moist Heat;Therapeutic activities;Therapeutic exercise;Passive range of motion;Vasopneumatic Device;Manual techniques;Neuromuscular re-education;Patient/family education;Gait training;Stair training;Ultrasound   PT Next Visit Plan update HEP PRN,  bike vs Nu-step, knee mobs, quad/ hip strengthening, modalities PRN gait training without AD, progress balance traninining   PT Home Exercise Plan Up date  HEP, seated heel slides, SLR with quad set, hamstring / hip flexor stretching, hamstroing band   Consulted and Agree with Plan of Care Patient      Patient will benefit from skilled therapeutic intervention in order to improve the following deficits and impairments:  Abnormal gait, Pain, Improper body mechanics, Postural dysfunction, Decreased strength, Increased edema, Decreased endurance, Decreased activity tolerance, Decreased balance, Decreased range of motion, Increased fascial restricitons  Visit Diagnosis: Other abnormalities of gait and mobility - Plan: PT plan of care cert/re-cert  Stiffness of left knee, not elsewhere classified - Plan: PT plan of care cert/re-cert  Chronic pain of left knee - Plan: PT plan of care cert/re-cert  Localized edema - Plan: PT plan of care cert/re-cert  Muscle weakness (generalized) - Plan: PT plan of care cert/re-cert     Problem  List Patient Active Problem List   Diagnosis Date Noted  . Primary osteoarthritis of left knee 12/30/2016  . Acute low back pain without sciatica 11/10/2016  . Obesity 05/24/2016  . Hyperglycemia 02/24/2014  . Osteoarthritis of right knee 11/15/2013  . Osteoarthritis of left knee 11/15/2013  . Alkaline phosphatase elevation 01/02/2012  . Hyperlipidemia 01/02/2012  . Routine general medical examination at a health care facility 12/09/2011  . 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 POLYARTHRITIS 01/28/2010  . ANGIOEDEMA 03/27/2007  . Essential hypertension 03/19/2007  . Perennial allergic rhinitis with seasonal variation 03/19/2007  . Moderate intermittent asthma 03/19/2007  . GERD 03/19/2007  . HIATAL HERNIA 03/19/2007  . GESTATIONAL DIABETES 03/19/2007   KStarr LakePT, DPT, LAT, [name]  03/02/17  10:25 AM      [facility]GSaw[address] [place] [address]Phone: [phone]  Fax:  [phone] Name: SZavia PullenMRN: [id number] of Birth: [birth year]

## 2017-03-08 ENCOUNTER — Ambulatory Visit: Payer: 59 | Attending: Orthopedic Surgery | Admitting: Physical Therapy

## 2017-03-08 ENCOUNTER — Encounter: Payer: Self-pay | Admitting: Physical Therapy

## 2017-03-08 DIAGNOSIS — R2689 Other abnormalities of gait and mobility: Secondary | ICD-10-CM | POA: Insufficient documentation

## 2017-03-08 DIAGNOSIS — G8929 Other chronic pain: Secondary | ICD-10-CM | POA: Insufficient documentation

## 2017-03-08 DIAGNOSIS — M6281 Muscle weakness (generalized): Secondary | ICD-10-CM | POA: Insufficient documentation

## 2017-03-08 DIAGNOSIS — M25562 Pain in left knee: Secondary | ICD-10-CM | POA: Insufficient documentation

## 2017-03-08 DIAGNOSIS — R600 Localized edema: Secondary | ICD-10-CM | POA: Insufficient documentation

## 2017-03-08 DIAGNOSIS — M25662 Stiffness of left knee, not elsewhere classified: Secondary | ICD-10-CM | POA: Diagnosis not present

## 2017-03-08 NOTE — Therapy (Signed)
[place], [place], [address] Phone: [phone]   Fax:  [phone]  Physical Therapy Treatment  Patient Details  Name: Katherine Tanner MRN: [id number] Date of Birth: 04-10-1961 Referring Provider: Lowella Petties MD  Encounter Date: 03/08/2017      PT End of Session - 03/08/17 1149    Visit Number 13   Number of Visits 18   Date for PT Re-Evaluation 04/13/17   PT Start Time 1105   PT Stop Time 1148   PT Time Calculation (min) 43 min   Activity Tolerance Patient tolerated treatment well   Behavior During Therapy [facility] for tasks assessed/performed      Past Medical History:  Diagnosis Date  . Allergy    takes Claritin daily and Flonase daily  . Arthritis   . Asthma    uses Albuterol daily as needed;Singulair nightly;DUlera daily  . GERD (gastroesophageal reflux disease)    occasionally will take OTC meds but states she just watches what she eats  . H/O hiatal hernia   . History of bronchitis    last time 9yr ago  . History of colon polyps   . History of kidney stones   . History of shingles   . Hypertension    takes Hyzaar daily  . Joint pain   . Joint swelling   . Pneumonia    hx of;last time at least 89yrago  . PONV (postoperative nausea and vomiting)     Past Surgical History:  Procedure Laterality Date  . BREAST EXCISIONAL BIOPSY Right   . CARDIAC CATHETERIZATION    . COLONOSCOPY    . ESOPHAGOGASTRODUODENOSCOPY    . KNEE SURGERY Right    arthroscopy  . Right ganglion cyst     x 2  . right lumpectomy  around 1983  . STERIOD INJECTION Left 11/15/2013   Procedure: Marcaine/STEROID INJECTION;  Surgeon: JoAlta CorningMD;  [facility]: [facility];  Laterality: Left;  . TOTAL KNEE ARTHROPLASTY Right 11/15/2013   Procedure: RIGHT TOTAL KNEE ARTHROPLASTY;  Surgeon: JoAlta CorningMD;  [facility] [facility]: [facility];  Laterality: Right;  . TOTAL KNEE  ARTHROPLASTY Left 12/30/2016   Procedure: TOTAL KNEE ARTHROPLASTY;  Surgeon: GrDorna LeitzMD;  [place] [facility] Service: [place]ics;  Laterality: Left;  . TUBAL LIGATION      There were no vitals filed for this visit.      Subjective Assessment - 03/08/17 1113    Subjective "I have been trying to walk more without my cane, some soreness in the knee.   Currently in Pain? Yes   Pain Score 2    Pain Location Knee   Pain Orientation Left   Pain Descriptors / Indicators Aching   Pain Type Surgical pain   Pain Onset More than a month ago   Pain Frequency Intermittent   Aggravating Factors  unsure   Pain Relieving Factors ice, meds,                         [facility] Adult PT Treatment/Exercise - 03/08/17 1117      Knee/Hip Exercises: Stretches   Active Hamstring Stretch 2 reps;30 seconds   Hip Flexor Stretch 2 reps;30 seconds   Gastroc Stretch 2 reps;30 seconds   Gastroc Stretch Limitations slant board     Knee/Hip Exercises: Aerobic   Recumbent Bike L3 x 8 min   lowering seat every 2 min  Knee/Hip Exercises: Machines for Strengthening   Cybex Knee Flexion 2 x 12 25#  bil     Knee/Hip Exercises: Standing   Forward Lunges 2 sets;10 reps  touching down onto Bosu             Balance Exercises - 03/08/17 1148      Balance Exercises: Standing   Tandem Stance Eyes open;4 reps;30 secs  with head turns   Tandem Gait Forward;Retro;4 reps  33f             PT Short Term Goals - 03/02/17 1022      PT SHORT TERM GOAL #1   Title pt to be I with inital HEP (02/18/2017)   Time 4   Period Weeks   Status Achieved     PT SHORT TERM GOAL #2   Title pt to increase L knee flexion to >/= 110 degrees and extension to </=-10 degrees to promote functional mobility (02/18/2017)   Time 4   Period Weeks   Status Achieved     PT SHORT TERM GOAL #3   Title decrease L knee edema by >/= 3 cm globally to reduce pain and promote knee mobility (02/18/2017)    Period Weeks   Status Achieved     PT SHORT TERM GOAL #4   Title pt to ambulate >/= 150 ft without AD and navigate up/ down 10 steps with 1 HHA for safety reciprocally (02/18/2017)   Period Weeks   Status Achieved           PT Long Term Goals - 03/02/17 1022      PT LONG TERM GOAL #1   Title pt to increase L knee mobility to -3 to 120 degrees for functional mobility required for efficient gait pattern (03/15/2017)   Baseline -4 to 123 degrees   Time 8   Period Weeks   Status Partially Met     PT LONG TERM GOAL #2   Title Increase L knee strength to >/= 4+/5 to promote knee stability with prolonged walking and standing activities (03/15/2017)   Time 8   Period Weeks   Status Partially Met     PT LONG TERM GOAL #3   Title pt will be able to stand/ walk >/= 1 hour and navigate >/= 20 steps reciprocally with </= 1 HHA for safety PRN for functional endurance required for work activities and tasks (03/15/2017)   Period Weeks   Status Partially Met     PT LONG TERM GOAL #4   Title pt will increase FOTO score to </= 43% limited to demo improvement in function (03/15/2017)   Time 8   Period Weeks   Status Partially Met     PT LONG TERM GOAL #5   Title pt to be I with all HEP given as of last visit (03/15/2017)   Time 8   Period Weeks   Status On-going               Plan - 03/08/17 1149    Clinical Impression Statement pt initially reported 2/10 pain starting treatment today. She reported biggest difficulty with balance and squating. focused on progressing balance which she perofrmed well with forward/ retro tandem walking.  squating techniques using lunge to promote balance and hip / knee strength. post session she reported no pain and that her knee felt more relaxed. she declined modalities post session.    PT Next Visit Plan update HEP PRN,  bike, , quad/ hip strengthening, gym  specific exercises,  progress balance traninining, modalitis PRN.    PT Home Exercise Plan Up  date  HEP, seated heel slides, SLR with quad set, hamstring / hip flexor stretching, hamstroing band   Consulted and Agree with Plan of Care Patient      Patient will benefit from skilled therapeutic intervention in order to improve the following deficits and impairments:  Abnormal gait, Pain, Improper body mechanics, Postural dysfunction, Decreased strength, Increased edema, Decreased endurance, Decreased activity tolerance, Decreased balance, Decreased range of motion, Increased fascial restricitons  Visit Diagnosis: Other abnormalities of gait and mobility  Stiffness of left knee, not elsewhere classified  Chronic pain of left knee  Localized edema  Muscle weakness (generalized)     Problem List Patient Active Problem List   Diagnosis Date Noted  . Primary osteoarthritis of left knee 12/30/2016  . Acute low back pain without sciatica 11/10/2016  . Obesity 05/24/2016  . Hyperglycemia 02/24/2014  . Osteoarthritis of right knee 11/15/2013  . Osteoarthritis of left knee 11/15/2013  . Alkaline phosphatase elevation 01/02/2012  . Hyperlipidemia 01/02/2012  . Routine general medical examination at a health care facility 12/09/2011  . 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 POLYARTHRITIS 01/28/2010  . ANGIOEDEMA 03/27/2007  . Essential hypertension 03/19/2007  . Perennial allergic rhinitis with seasonal variation 03/19/2007  . Moderate intermittent asthma 03/19/2007  . GERD 03/19/2007  . HIATAL HERNIA 03/19/2007  . GESTATIONAL DIABETES 03/19/2007   Starr Lake PT, DPT, LAT, [name]  03/08/17  11:52 AM      [facility] [address] [place], [place], [address] Phone: [phone]   Fax:  [phone]  Name: Katherine Tanner MRN: [id number] Date of Birth: April 12, 1961

## 2017-03-14 ENCOUNTER — Ambulatory Visit: Payer: 59 | Admitting: Physical Therapy

## 2017-03-14 ENCOUNTER — Encounter: Payer: Self-pay | Admitting: Physical Therapy

## 2017-03-14 DIAGNOSIS — G8929 Other chronic pain: Secondary | ICD-10-CM | POA: Diagnosis not present

## 2017-03-14 DIAGNOSIS — R600 Localized edema: Secondary | ICD-10-CM

## 2017-03-14 DIAGNOSIS — M25562 Pain in left knee: Secondary | ICD-10-CM

## 2017-03-14 DIAGNOSIS — M6281 Muscle weakness (generalized): Secondary | ICD-10-CM

## 2017-03-14 DIAGNOSIS — R2689 Other abnormalities of gait and mobility: Secondary | ICD-10-CM | POA: Diagnosis not present

## 2017-03-14 DIAGNOSIS — M25662 Stiffness of left knee, not elsewhere classified: Secondary | ICD-10-CM

## 2017-03-14 NOTE — Therapy (Signed)
[place], [place], [address] Phone: [phone]   Fax:  [phone]  Physical Therapy Treatment  Patient Details  Name: Tekia Waterbury MRN: [id number] Date of Birth: 01/15/1961 Referring Provider: Lowella Petties MD  Encounter Date: 03/14/2017      PT End of Session - 03/14/17 1057    Visit Number 14   Number of Visits 18   Date for PT Re-Evaluation 04/13/17   PT Start Time 4097   PT Stop Time 1114   PT Time Calculation (min) 59 min   Activity Tolerance Patient tolerated treatment well   Behavior During Therapy [facility] for tasks assessed/performed      Past Medical History:  Diagnosis Date  . Allergy    takes Claritin daily and Flonase daily  . Arthritis   . Asthma    uses Albuterol daily as needed;Singulair nightly;DUlera daily  . GERD (gastroesophageal reflux disease)    occasionally will take OTC meds but states she just watches what she eats  . H/O hiatal hernia   . History of bronchitis    last time 74yr ago  . History of colon polyps   . History of kidney stones   . History of shingles   . Hypertension    takes Hyzaar daily  . Joint pain   . Joint swelling   . Pneumonia    hx of;last time at least 244yrago  . PONV (postoperative nausea and vomiting)     Past Surgical History:  Procedure Laterality Date  . BREAST EXCISIONAL BIOPSY Right   . CARDIAC CATHETERIZATION    . COLONOSCOPY    . ESOPHAGOGASTRODUODENOSCOPY    . KNEE SURGERY Right    arthroscopy  . Right ganglion cyst     x 2  . right lumpectomy  around 1983  . STERIOD INJECTION Left 11/15/2013   Procedure: Marcaine/STEROID INJECTION;  Surgeon: JoAlta CorningMD;  [facility];  Laterality: Left;  . TOTAL KNEE ARTHROPLASTY Right 11/15/2013   Procedure: RIGHT TOTAL KNEE ARTHROPLASTY;  Surgeon: JoAlta CorningMD;  [facility] Service: Orthopedics;  Laterality: Right;  . TOTAL KNEE  ARTHROPLASTY Left 12/30/2016   Procedure: TOTAL KNEE ARTHROPLASTY;  Surgeon: GrDorna LeitzMD;  [facility] Service: Orthopedics;  Laterality: Left;  . TUBAL LIGATION      There were no vitals filed for this visit.      Subjective Assessment - 03/14/17 1021    Subjective They changed the medication from vicoden to tramadol.  4/10 now in both knees.  Able to do exercises and walk 20 minutes.   Currently in Pain? Yes   Pain Score 4    Pain Location Knee   Pain Orientation Left   Pain Descriptors / Indicators Aching   Pain Type Surgical pain   Pain Frequency Intermittent   Aggravating Factors  change of weather   Pain Relieving Factors ice,  heat,  meds   Effect of Pain on Daily Activities Uses cane intermittantly   Multiple Pain Sites --  Left knee hurts too 4/10  Not sure why?  Weather                         [facility] Adult PT Treatment/Exercise - 03/14/17 0001      High Level Balance   High Level Balance Comments static and dynamic,  narriwed base exercises     Knee/Hip Exercises:  Stretches   Gastroc Stretch 3 reps;30 seconds     Knee/Hip Exercises: Aerobic   Recumbent Bike L2 X 8 minutes     Knee/Hip Exercises: Machines for Strengthening   Cybex Knee Flexion 2 X 15 20,  25 LBS each      Knee/Hip Exercises: Standing   Heel Raises 10 reps;2 sets  left only,  difficult   Forward Lunges 2 sets;10 reps  touching down onto Bosu     Moist Heat Therapy   Number Minutes Moist Heat 8 Minutes   Moist Heat Location Knee  concurrent with manual     Manual Therapy   Manual therapy comments manual patellar mobs sustained,  lateral knee at joint line sustaimed stretch/ soft tissue work.                  PT Short Term Goals - 03/02/17 1022      PT SHORT TERM GOAL #1   Title pt to be I with inital HEP (02/18/2017)   Time 4   Period Weeks   Status Achieved     PT SHORT TERM GOAL #2   Title pt to increase L knee flexion to >/= 110 degrees and  extension to </=-10 degrees to promote functional mobility (02/18/2017)   Time 4   Period Weeks   Status Achieved     PT SHORT TERM GOAL #3   Title decrease L knee edema by >/= 3 cm globally to reduce pain and promote knee mobility (02/18/2017)   Period Weeks   Status Achieved     PT SHORT TERM GOAL #4   Title pt to ambulate >/= 150 ft without AD and navigate up/ down 10 steps with 1 HHA for safety reciprocally (02/18/2017)   Period Weeks   Status Achieved           PT Long Term Goals - 03/02/17 1022      PT LONG TERM GOAL #1   Title pt to increase L knee mobility to -3 to 120 degrees for functional mobility required for efficient gait pattern (03/15/2017)   Baseline -4 to 123 degrees   Time 8   Period Weeks   Status Partially Met     PT LONG TERM GOAL #2   Title Increase L knee strength to >/= 4+/5 to promote knee stability with prolonged walking and standing activities (03/15/2017)   Time 8   Period Weeks   Status Partially Met     PT LONG TERM GOAL #3   Title pt will be able to stand/ walk >/= 1 hour and navigate >/= 20 steps reciprocally with </= 1 HHA for safety PRN for functional endurance required for work activities and tasks (03/15/2017)   Period Weeks   Status Partially Met     PT LONG TERM GOAL #4   Title pt will increase FOTO score to </= 43% limited to demo improvement in function (03/15/2017)   Time 8   Period Weeks   Status Partially Met     PT LONG TERM GOAL #5   Title pt to be I with all HEP given as of last visit (03/15/2017)   Time 8   Period Weeks   Status On-going               Plan - 03/14/17 1059    Clinical Impression Statement Pain decreased from 4/10 to 3/10 post session.  SLS 6 seconds 1st try .  Squatting techniques improving.   PT Next Visit  Plan update HEP PRN,  bike, , quad/ hip strengthening, gym specific exercises,  progress balance traninining, modalitis PRN.    PT Home Exercise Plan Up date  HEP, seated heel slides, SLR with  quad set, hamstring / hip flexor stretching, hamstroing band   Consulted and Agree with Plan of Care Patient      Patient will benefit from skilled therapeutic intervention in order to improve the following deficits and impairments:     Visit Diagnosis: Other abnormalities of gait and mobility  Stiffness of left knee, not elsewhere classified  Chronic pain of left knee  Localized edema  Muscle weakness (generalized)     Problem List Patient Active Problem List   Diagnosis Date Noted  . Primary osteoarthritis of left knee 12/30/2016  . Acute low back pain without sciatica 11/10/2016  . Obesity 05/24/2016  . Hyperglycemia 02/24/2014  . Osteoarthritis of right knee 11/15/2013  . Osteoarthritis of left knee 11/15/2013  . Alkaline phosphatase elevation 01/02/2012  . Hyperlipidemia 01/02/2012  . Routine general medical examination at a health care facility 12/09/2011  . 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 POLYARTHRITIS 01/28/2010  . ANGIOEDEMA 03/27/2007  . Essential hypertension 03/19/2007  . Perennial allergic rhinitis with seasonal variation 03/19/2007  . Moderate intermittent asthma 03/19/2007  . GERD 03/19/2007  . HIATAL HERNIA 03/19/2007  . GESTATIONAL DIABETES 03/19/2007    HARRIS,KAREN PTA 03/14/2017, 11:02 AM  [facility] [address] [place], [place], [address] Phone: [phone]   Fax:  [phone]  Name: Doretha Goding MRN: [id number] Date of Birth: [birth year]

## 2017-03-15 DIAGNOSIS — Z96652 Presence of left artificial knee joint: Secondary | ICD-10-CM | POA: Diagnosis not present

## 2017-03-15 DIAGNOSIS — Z471 Aftercare following joint replacement surgery: Secondary | ICD-10-CM | POA: Diagnosis not present

## 2017-03-15 DIAGNOSIS — M1712 Unilateral primary osteoarthritis, left knee: Secondary | ICD-10-CM | POA: Diagnosis not present

## 2017-03-21 ENCOUNTER — Ambulatory Visit: Payer: 59 | Admitting: Physical Therapy

## 2017-03-21 ENCOUNTER — Encounter: Payer: Self-pay | Admitting: Physical Therapy

## 2017-03-21 DIAGNOSIS — R600 Localized edema: Secondary | ICD-10-CM | POA: Diagnosis not present

## 2017-03-21 DIAGNOSIS — R2689 Other abnormalities of gait and mobility: Secondary | ICD-10-CM | POA: Diagnosis not present

## 2017-03-21 DIAGNOSIS — M6281 Muscle weakness (generalized): Secondary | ICD-10-CM | POA: Diagnosis not present

## 2017-03-21 DIAGNOSIS — G8929 Other chronic pain: Secondary | ICD-10-CM

## 2017-03-21 DIAGNOSIS — M25562 Pain in left knee: Secondary | ICD-10-CM

## 2017-03-21 DIAGNOSIS — M25662 Stiffness of left knee, not elsewhere classified: Secondary | ICD-10-CM

## 2017-03-21 NOTE — Therapy (Addendum)
[place], [place], [address] Phone: [phone]   Fax:  [phone]  Physical Therapy Treatment / Discharge Summary  Patient Details  Name: Katherine Tanner MRN: [id number] Date of Birth: 20-Mar-1961 Referring Provider: Lowella Petties MD  Encounter Date: 03/21/2017    Past Medical History:  Diagnosis Date  . Allergy    takes Claritin daily and Flonase daily  . Arthritis   . Asthma    uses Albuterol daily as needed;Singulair nightly;DUlera daily  . GERD (gastroesophageal reflux disease)    occasionally will take OTC meds but states she just watches what she eats  . H/O hiatal hernia   . History of bronchitis    last time 52yr ago  . History of colon polyps   . History of kidney stones   . History of shingles   . Hypertension    takes Hyzaar daily  . Joint pain   . Joint swelling   . Pneumonia    hx of;last time at least 237yrago  . PONV (postoperative nausea and vomiting)     Past Surgical History:  Procedure Laterality Date  . BREAST EXCISIONAL BIOPSY Right   . CARDIAC CATHETERIZATION    . COLONOSCOPY    . ESOPHAGOGASTRODUODENOSCOPY    . KNEE SURGERY Right    arthroscopy  . Right ganglion cyst     x 2  . right lumpectomy  around 1983  . STERIOD INJECTION Left 11/15/2013   Procedure: Marcaine/STEROID INJECTION;  Surgeon: JoAlta CorningMD;  [facility];  Laterality: Left;  . TOTAL KNEE ARTHROPLASTY Right 11/15/2013   Procedure: RIGHT TOTAL KNEE ARTHROPLASTY;  Surgeon: JoAlta CorningMD;  [facility]: [facility]opedics;  Laterality: Right;  . TOTAL KNEE ARTHROPLASTY Left 12/30/2016   Procedure: TOTAL KNEE ARTHROPLASTY;  Surgeon: GrDorna LeitzMD;  [facility]: [facility];  Laterality: Left;  . TUBAL LIGATION      There were no vitals filed for this visit.      Subjective Assessment - 03/21/17 1123    Subjective Last visit  today.  I see the Md on Monday.  I plan to use the bike and the leg press at the gym when I return to work.  Until then I will work out at home.                             [facility] PT Treatment/Exercise - 03/21/17 0001      Exercises   Exercises --  entire HEP reviewed,  showed he what  she may do in gym     Knee/Hip Exercises: Aerobic   Recumbent Bike L2 X 8 minutes     Knee/Hip Exercises: Machines for Strengthening   Cybex Knee Flexion 2 X 15 20,  25 LBS each    Total Gym Leg Press 3 sets 10  20, 25 (X 2)     Knee/Hip Exercises: Standing   Gait Training 20 steps 0 to 1 rail reciprocal, no pain increase                  PT Short Term Goals - 03/02/17 1022      PT SHORT TERM GOAL #1   Title pt to be I with inital HEP (02/18/2017)   Time 4   Period Weeks   Status Achieved     PT SHORT TERM GOAL #  2   Title pt to increase L knee flexion to >/= 110 degrees and extension to </=-10 degrees to promote functional mobility (02/18/2017)   Time 4   Period Weeks   Status Achieved     PT SHORT TERM GOAL #3   Title decrease L knee edema by >/= 3 cm globally to reduce pain and promote knee mobility (02/18/2017)   Period Weeks   Status Achieved     PT SHORT TERM GOAL #4   Title pt to ambulate >/= 150 ft without AD and navigate up/ down 10 steps with 1 HHA for safety reciprocally (02/18/2017)   Period Weeks   Status Achieved           PT Long Term Goals - 03/21/17 1817      PT LONG TERM GOAL #1   Title pt to increase L knee mobility to -3 to 120 degrees for functional mobility required for efficient gait pattern (03/15/2017)   Baseline -4 to 122   Time 8   Period Weeks   Status Partially Met     PT LONG TERM GOAL #2   Title Increase L knee strength to >/= 4+/5 to promote knee stability with prolonged walking and standing activities (03/15/2017)   Baseline 5/5   Time 8   Period Weeks   Status Achieved     PT LONG TERM GOAL #3   Title pt will  be able to stand/ walk >/= 1 hour and navigate >/= 20 steps reciprocally with </= 1 HHA for safety PRN for functional endurance required for work activities and tasks (03/15/2017)   Baseline able  0 Pain    Time 8   Period Weeks   Status Achieved     PT LONG TERM GOAL #4   Title pt will increase FOTO score to </= 43% limited to demo improvement in function (03/15/2017)   Baseline 42 % limitation   Time 8   Period Weeks   Status Achieved     PT LONG TERM GOAL #5   Title pt to be I with all HEP given as of last visit (03/15/2017)   Baseline independent   Time 8   Period Weeks   Status Achieved               Plan - 03/21/17 1821    Clinical Impression Statement Patient says this is heer last visit and she is ready for D/C.  She is going to exercise at home until she returns to work.  where she will exercise in the gym.  Most all her goals are met or partially met.  FOTO is 42 % limitation.  Edema mid patella 44 cm, 3 inches above and belower patella borders 54.9 Cm and 40.5 cm.She is happy with her progress.  No increased pain at end of session.  She declined the need for modalities.    PT Next Visit Plan D/C,  patient's request.   Consulted and Agree with Plan of Care Patient      Patient will benefit from skilled therapeutic intervention in order to improve the following deficits and impairments:     Visit Diagnosis: Other abnormalities of gait and mobility  Stiffness of left knee, not elsewhere classified  Chronic pain of left knee  Localized edema  Muscle weakness (generalized)     Problem List Patient Active Problem List   Diagnosis Date Noted  . Primary osteoarthritis of left knee 12/30/2016  . Acute low back pain without sciatica 11/10/2016  .  Obesity 05/24/2016  . Hyperglycemia 02/24/2014  . Osteoarthritis of right knee 11/15/2013  . Osteoarthritis of left knee 11/15/2013  . Alkaline phosphatase elevation 01/02/2012  . Hyperlipidemia 01/02/2012  .  Routine general medical examination at a health care facility 12/09/2011  . 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 POLYARTHRITIS 01/28/2010  . ANGIOEDEMA 03/27/2007  . Essential hypertension 03/19/2007  . Perennial allergic rhinitis with seasonal variation 03/19/2007  . Moderate intermittent asthma 03/19/2007  . GERD 03/19/2007  . HIATAL HERNIA 03/19/2007  . GESTATIONAL DIABETES 03/19/2007    Melvenia Needles PTA 03/22/2017, 7:57 AM  [place], [place], [address] Phone: [phone]   Fax:  [phone]  Name: Jaree Dwight MRN: [id number] Date of Birth: 07/26/1960      PHYSICAL THERAPY DISCHARGE SUMMARY  Visits from Start of Care: 15  Current functional level related to goals / functional outcomes: See goals, FOTO 42% limited   Remaining deficits: Intermittent swell and soreness with intermittent swelling depending on activity.    Education / Equipment: HEP, theraband, balance training, posture, edema reduction techniques  Plan: Patient agrees to discharge.  Patient goals were partially met. Patient is being discharged due to meeting the stated rehab goals.  ?????     Kristoffer Leamon PT, DPT, LAT, [name]  03/22/17  7:59 AM

## 2017-03-27 DIAGNOSIS — M1712 Unilateral primary osteoarthritis, left knee: Secondary | ICD-10-CM | POA: Diagnosis not present

## 2017-03-27 DIAGNOSIS — Z9889 Other specified postprocedural states: Secondary | ICD-10-CM | POA: Diagnosis not present

## 2017-03-27 MED FILL — HYDROCODON-APAP 5-325: 5-325 | 20 days supply | Qty: 20 | Fill #0

## 2017-03-27 MED FILL — BREO ELLIPTA 100-25 MCG INH: 100-25 | 30 days supply | Qty: 60 | Fill #10

## 2017-03-27 MED FILL — MONTELUKAST SOD 10 MG TAB: 10 | 90 days supply | Qty: 90 | Fill #3

## 2017-04-13 MED FILL — traMADol HCL 50 MG TABS: 50 | 15 days supply | Qty: 120 | Fill #0

## 2017-04-13 MED FILL — AMITRIPTYLINE HCL 25 MG TAB: 25 | 30 days supply | Qty: 30 | Fill #0

## 2017-04-14 MED FILL — NABUMETONE 750 MG TABLET: 750 | 30 days supply | Qty: 60 | Fill #0

## 2017-04-20 ENCOUNTER — Other Ambulatory Visit: Payer: Self-pay | Admitting: Internal Medicine

## 2017-04-21 MED FILL — IPRAT-ALBUT 0.5-3(2.5) MG/3: 0.5-2.5 (3) | 7 days supply | Qty: 90 | Fill #0

## 2017-04-24 MED FILL — VENTOLIN HFA 90 MCG INHALER: 108 (90 BAS | 25 days supply | Qty: 18 | Fill #1

## 2017-05-03 MED FILL — BREO ELLIPTA 100-25 MCG INH: 100-25 | 30 days supply | Qty: 60 | Fill #11

## 2017-05-15 DIAGNOSIS — Z01419 Encounter for gynecological examination (general) (routine) without abnormal findings: Secondary | ICD-10-CM | POA: Diagnosis not present

## 2017-05-16 DIAGNOSIS — M25562 Pain in left knee: Secondary | ICD-10-CM | POA: Diagnosis not present

## 2017-05-16 MED FILL — traMADol HCL 50 MG TABS: 50 | 15 days supply | Qty: 120 | Fill #0

## 2017-05-17 ENCOUNTER — Telehealth: Payer: Self-pay | Admitting: *Deleted

## 2017-05-17 DIAGNOSIS — R739 Hyperglycemia, unspecified: Secondary | ICD-10-CM

## 2017-05-17 DIAGNOSIS — I10 Essential (primary) hypertension: Secondary | ICD-10-CM

## 2017-05-17 DIAGNOSIS — E7800 Pure hypercholesterolemia, unspecified: Secondary | ICD-10-CM

## 2017-05-17 NOTE — Telephone Encounter (Signed)
appt scheduled

## 2017-05-17 NOTE — Telephone Encounter (Signed)
See prev message from [facility]. Pt has med refill appt scheduled on 06/13/17, ? If labs need to be done prior to appt

## 2017-05-17 NOTE — Telephone Encounter (Signed)
Copied from [place] #[id number]. Topic: Appointment Scheduling - Scheduling Inquiry for Clinic >> May 17, 2017  4:22 PM Bea Graff, NT wrote: Reason for CRM: Patient requesting lab work. There is no order in and she said she has a physical done with her GYN but labs there.

## 2017-05-17 NOTE — Telephone Encounter (Signed)
Yes- it would be optimal to do them first  I put the orders in

## 2017-05-22 ENCOUNTER — Other Ambulatory Visit: Payer: Self-pay | Admitting: Family Medicine

## 2017-05-22 MED FILL — NABUMETONE 750 MG TABLET: 750 | 30 days supply | Qty: 60 | Fill #1

## 2017-05-22 MED FILL — LOSARTAN-HCTZ 100-25 MG TAB: 100-25 | 90 days supply | Qty: 90 | Fill #0

## 2017-05-22 NOTE — Telephone Encounter (Signed)
Electronic refill Last refill 05/24/16 #90/3 Upcoming appointment 06/13/17 See allergy/contraindication

## 2017-05-31 MED FILL — BREO ELLIPTA 100-25 MCG INH: 100-25 | 30 days supply | Qty: 60 | Fill #12

## 2017-06-08 ENCOUNTER — Other Ambulatory Visit (INDEPENDENT_AMBULATORY_CARE_PROVIDER_SITE_OTHER): Payer: 59

## 2017-06-08 DIAGNOSIS — E7800 Pure hypercholesterolemia, unspecified: Secondary | ICD-10-CM

## 2017-06-08 DIAGNOSIS — R739 Hyperglycemia, unspecified: Secondary | ICD-10-CM

## 2017-06-08 DIAGNOSIS — I10 Essential (primary) hypertension: Secondary | ICD-10-CM

## 2017-06-08 LAB — CBC WITH DIFFERENTIAL/PLATELET
BASOS PCT: 0.8 % (ref 0.0–3.0)
Basophils Absolute: 0.1 10*3/uL (ref 0.0–0.1)
EOS ABS: 0.1 10*3/uL (ref 0.0–0.7)
Eosinophils Relative: 1.6 % (ref 0.0–5.0)
HEMATOCRIT: 39.6 % (ref 36.0–46.0)
Hemoglobin: 12.9 g/dL (ref 12.0–15.0)
LYMPHS ABS: 1.9 10*3/uL (ref 0.7–4.0)
Lymphocytes Relative: 29.9 % (ref 12.0–46.0)
MCHC: 32.6 g/dL (ref 30.0–36.0)
MCV: 87.2 fl (ref 78.0–100.0)
MONO ABS: 0.8 10*3/uL (ref 0.1–1.0)
Monocytes Relative: 11.8 % (ref 3.0–12.0)
NEUTROS ABS: 3.6 10*3/uL (ref 1.4–7.7)
NEUTROS PCT: 55.9 % (ref 43.0–77.0)
PLATELETS: 152 10*3/uL (ref 150.0–400.0)
RBC: 4.54 Mil/uL (ref 3.87–5.11)
RDW: 14.8 % (ref 11.5–15.5)
WBC: 6.5 10*3/uL (ref 4.0–10.5)

## 2017-06-08 LAB — COMPREHENSIVE METABOLIC PANEL
ALT: 34 U/L (ref 0–35)
AST: 25 U/L (ref 0–37)
Albumin: 4.4 g/dL (ref 3.5–5.2)
Alkaline Phosphatase: 155 U/L — ABNORMAL HIGH (ref 39–117)
BUN: 16 mg/dL (ref 6–23)
CALCIUM: 9.4 mg/dL (ref 8.4–10.5)
CHLORIDE: 100 meq/L (ref 96–112)
CO2: 33 meq/L — AB (ref 19–32)
CREATININE: 0.69 mg/dL (ref 0.40–1.20)
GFR: 113.7 mL/min (ref >60.00)
GLUCOSE: 77 mg/dL (ref 70–99)
Potassium: 3.7 mEq/L (ref 3.5–5.1)
Sodium: 141 mEq/L (ref 135–145)
Total Bilirubin: 0.4 mg/dL (ref 0.2–1.2)
Total Protein: 7.2 g/dL (ref 6.0–8.3)

## 2017-06-08 LAB — LIPID PANEL
CHOL/HDL RATIO: 3
Cholesterol: 191 mg/dL (ref 0–200)
HDL: 61.6 mg/dL (ref >39.00)
LDL CALC: 116 mg/dL — AB (ref 0–99)
NONHDL: 129.71
TRIGLYCERIDES: 69 mg/dL (ref 0.0–149.0)
VLDL: 13.8 mg/dL (ref 0.0–40.0)

## 2017-06-08 LAB — TSH: TSH: 1.26 u[IU]/mL (ref 0.35–4.50)

## 2017-06-08 LAB — HEMOGLOBIN A1C: Hgb A1c MFr Bld: 6.1 % (ref 4.6–6.5)

## 2017-06-13 ENCOUNTER — Ambulatory Visit: Payer: Self-pay | Admitting: Family Medicine

## 2017-06-13 ENCOUNTER — Telehealth: Payer: Self-pay | Admitting: Internal Medicine

## 2017-06-13 MED ORDER — PREDNISONE 10 MG PO TABS: ORAL_TABLET | ORAL | 0 refills | Status: DC

## 2017-06-13 NOTE — Telephone Encounter (Signed)
Asthma x 3 days, not controlled by neb and inhalers. Denies fever, purulent "just my pure asthma" Plan- prednisone taper sent to [facility]

## 2017-06-21 ENCOUNTER — Telehealth: Payer: Self-pay | Admitting: Internal Medicine

## 2017-06-21 MED ORDER — PREDNISONE 10 MG PO TABS: ORAL_TABLET | ORAL | 0 refills | Status: DC

## 2017-06-21 MED ORDER — FLUTICASONE FUROATE-VILANTEROL 200-25 MCG/INH IN AEPB: 1.0000 | INHALATION_SPRAY | Freq: Every day | RESPIRATORY_TRACT | 0 refills | Status: DC

## 2017-06-21 MED FILL — predniSONE 10 MG TABS: 10 | 5 days supply | Qty: 5 | Fill #0

## 2017-06-21 NOTE — Telephone Encounter (Signed)
Spoke with patient. She wishes to proceed with Breo 200 and prednisone. She will stop by the office today to pick up sample. She verbalized understanding. Nothing else needed at time of call.

## 2017-06-21 NOTE — Telephone Encounter (Signed)
Spoke with patient-states she completed her Prednisone taper and continues to have issues with her chest tightness (pt used rescue inhaler while on the phone with me). Pt would like to know if CY would like to bump up her Breo 100 to Breo 200 for now or other suggestions to help during this time. CY Please advise. Thanks.

## 2017-06-21 NOTE — Telephone Encounter (Signed)
Order- sample if available and change Rx to Breo 200  # 1, inhale 1 puff, then rinse mouth,m once daily, ref x 12  Also recommend o, rder prednisone 10 mg, # 5 , one daily

## 2017-06-22 MED FILL — traMADol HCL 50 MG TABS: 50 | 15 days supply | Qty: 120 | Fill #0

## 2017-06-22 MED FILL — NABUMETONE 750 MG TABLET: 750 | 30 days supply | Qty: 60 | Fill #2

## 2017-06-26 ENCOUNTER — Other Ambulatory Visit: Payer: Self-pay | Admitting: Internal Medicine

## 2017-06-26 MED FILL — MONTELUKAST SOD 10 MG TAB: 10 | 90 days supply | Qty: 90 | Fill #0

## 2017-06-26 MED FILL — VENTOLIN HFA 90 MCG INHALER: 108 (90 BAS | 25 days supply | Qty: 18 | Fill #0

## 2017-06-28 ENCOUNTER — Encounter: Payer: Self-pay | Admitting: Family Medicine

## 2017-06-28 ENCOUNTER — Ambulatory Visit (INDEPENDENT_AMBULATORY_CARE_PROVIDER_SITE_OTHER): Payer: 59 | Admitting: Family Medicine

## 2017-06-28 VITALS — BP 136/82 | HR 85 | Temp 98.8°F | Ht 61.0 in | Wt 202.2 lb

## 2017-06-28 DIAGNOSIS — Z6836 Body mass index (BMI) 36.0-36.9, adult: Secondary | ICD-10-CM | POA: Diagnosis not present

## 2017-06-28 DIAGNOSIS — J4520 Mild intermittent asthma, uncomplicated: Secondary | ICD-10-CM | POA: Diagnosis not present

## 2017-06-28 DIAGNOSIS — R748 Abnormal levels of other serum enzymes: Secondary | ICD-10-CM | POA: Diagnosis not present

## 2017-06-28 DIAGNOSIS — R739 Hyperglycemia, unspecified: Secondary | ICD-10-CM

## 2017-06-28 DIAGNOSIS — E7800 Pure hypercholesterolemia, unspecified: Secondary | ICD-10-CM | POA: Diagnosis not present

## 2017-06-28 DIAGNOSIS — I10 Essential (primary) hypertension: Secondary | ICD-10-CM

## 2017-06-28 DIAGNOSIS — M79672 Pain in left foot: Secondary | ICD-10-CM | POA: Diagnosis not present

## 2017-06-28 DIAGNOSIS — IMO0001 Reserved for inherently not codable concepts without codable children: Secondary | ICD-10-CM

## 2017-06-28 DIAGNOSIS — M79671 Pain in right foot: Secondary | ICD-10-CM | POA: Diagnosis not present

## 2017-06-28 MED ORDER — LOSARTAN POTASSIUM-HCTZ 100-25 MG PO TABS: 1.0000 | ORAL_TABLET | Freq: Every day | ORAL | 3 refills | Status: DC

## 2017-06-28 NOTE — Progress Notes (Signed)
Subjective:    Patient ID: Katherine Tanner, female    DOB: 27-Sep-1960, 56 y.o.   MRN: [id number]  HPI Here for f/u of chronic health problems   Asthma has been bad this year  Just finished prednisone  Inc Breo to 200  Sees pulm next week  Weather change and scents really bother her   Wt Readings from Last 3 Encounters:  06/28/17 202 lb 4 oz (91.7 kg)  12/30/16 198 lb 6.6 oz (90 kg)  12/19/16 199 lb 1.6 oz (90.3 kg)  wt is up 2 lb -has been on prednisone  Just went back to work in [employer] (from knee repl)- doing PT / and just was fitted with orthotics  Exercise is still limited Eats very well- lots of salads/vegetables  Very active job (at least 4 mi a day) and rides bike twice daily   38.21 kg/m   bp is stable today  No cp or palpitations or headaches or edema  No side effects to medicines  BP Readings from Last 3 Encounters:  06/28/17 136/82  01/01/17 124/61  12/19/16 (!) 144/70      Lab Results  Component Value Date   CREATININE 0.69 06/08/2017   BUN 16 06/08/2017   NA 141 06/08/2017   K 3.7 06/08/2017   CL 100 06/08/2017   CO2 33 (H) 06/08/2017   Lab Results  Component Value Date   ALT 34 06/08/2017   AST 25 06/08/2017   ALKPHOS 155 (H) 06/08/2017   BILITOT 0.4 06/08/2017  baseline high alk phos up from 130s--had a knee replacement   Glucose of 77  Lab Results  Component Value Date   WBC 6.5 06/08/2017   HGB 12.9 06/08/2017   HCT 39.6 06/08/2017   MCV 87.2 06/08/2017   PLT 152.0 06/08/2017    Hyperlipidemia  Lab Results  Component Value Date   CHOL 191 06/08/2017   HDL 61.60 06/08/2017   LDLCALC 116 (H) 06/08/2017   LDLDIRECT 147.9 12/17/2012   TRIG 69.0 06/08/2017   CHOLHDL 3 06/08/2017   Hyperglycemia  Lab Results  Component Value Date   HGBA1C 6.1 06/08/2017  no change from a year ago  Does try to watch processed carbs and sugar Needs to loose weight   Patient Active Problem List   Diagnosis Date Noted  . Primary  osteoarthritis of left knee 12/30/2016  . Acute low back pain without sciatica 11/10/2016  . Obesity 05/24/2016  . Hyperglycemia 02/24/2014  . Osteoarthritis of right knee 11/15/2013  . Osteoarthritis of left knee 11/15/2013  . Alkaline phosphatase elevation 01/02/2012  . Hyperlipidemia 01/02/2012  . Routine general medical examination at a health care facility 12/09/2011  . 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 POLYARTHRITIS 01/28/2010  . ANGIOEDEMA 03/27/2007  . Essential hypertension 03/19/2007  . Perennial allergic rhinitis with seasonal variation 03/19/2007  . Moderate intermittent asthma 03/19/2007  . GERD 03/19/2007  . HIATAL HERNIA 03/19/2007  . GESTATIONAL DIABETES 03/19/2007   Past Medical History:  Diagnosis Date  . Allergy    takes Claritin daily and Flonase daily  . Arthritis   . Asthma    uses Albuterol daily as needed;Singulair nightly;DUlera daily  . GERD (gastroesophageal reflux disease)    occasionally will take OTC meds but states she just watches what she eats  . H/O hiatal hernia   . History of bronchitis    last time 108yr ago  . History of colon polyps   . History of kidney stones   .  History of shingles   . Hypertension    takes Hyzaar daily  . Joint pain   . Joint swelling   . Pneumonia    hx of;last time at least 21yr ago  . PONV (postoperative nausea and vomiting)    Past Surgical History:  Procedure Laterality Date  . BREAST EXCISIONAL BIOPSY Right   . CARDIAC CATHETERIZATION    . COLONOSCOPY    . ESOPHAGOGASTRODUODENOSCOPY    . KNEE SURGERY Right    arthroscopy  . Right ganglion cyst     x 2  . right lumpectomy  around 1983  . STERIOD INJECTION Left 11/15/2013   Procedure: Marcaine/STEROID INJECTION;  Surgeon: JAlta Corning MD;  [place] [facility]  Service: Orthopedics;  Laterality: Left;  . TOTAL KNEE ARTHROPLASTY Right 11/15/2013   Procedure: RIGHT TOTAL KNEE ARTHROPLASTY;  Surgeon: JAlta Corning MD;  [place] [facility]  Service:  Orthopedics;  Laterality: Right;  . TOTAL KNEE ARTHROPLASTY Left 12/30/2016   Procedure: TOTAL KNEE ARTHROPLASTY;  Surgeon: GDorna Leitz MD;  [place] [facility]  Service: Orthopedics;  Laterality: Left;  . TUBAL LIGATION     Social History   Tobacco Use  . Smoking status: Never Smoker  . Smokeless tobacco: Never Used  Substance Use Topics  . Alcohol use: No    Alcohol/week: 0.0 oz  . Drug use: No   Family History  Problem Relation Age of Onset  . Diabetes Father   . Sarcoidosis Mother   . Glaucoma Mother   . Hypertension Mother   . Breast cancer Neg Hx    Allergies  Allergen Reactions  . Ace Inhibitors Shortness Of Breath and Cough    Cough/ breathing problems   . Amoxicillin-Pot Clavulanate Swelling    SWELLING REACTION UNSPECIFIED   . Influenza Virus Vacc Split Pf     UNSPECIFIED REACTION   . Shellfish Allergy Itching   Current Outpatient Medications on File Prior to Visit  Medication Sig Dispense Refill  . Calcium Citrate-Vitamin D (CALCIUM + D PO) Take 1 tablet by mouth daily.    . fluticasone (FLONASE) 50 MCG/ACT nasal spray Place 2 sprays into both nostrils daily. 48 g 3  . fluticasone furoate-vilanterol (BREO ELLIPTA) 200-25 MCG/INH AEPB Inhale 1 puff into the lungs daily. 1 each 0  . guaiFENesin (MUCINEX) 600 MG 12 hr tablet Take 600 mg by mouth 2 (two) times daily as needed.     .Marland Kitchenipratropium-albuterol (DUONEB) 0.5-2.5 (3) MG/3ML SOLN USE 1 VIAL VIA NEBULIZER EVERY 6 HOURS AS NEEDED 90 mL 12  . loratadine (CLARITIN) 10 MG tablet Take 10 mg by mouth daily.     . montelukast (SINGULAIR) 10 MG tablet TAKE 1 TABLET BY MOUTH AT BEDTIME. 90 tablet 3  . Multiple Vitamin (MULTIVITAMIN PO) Take 1 tablet by mouth daily.     . Nebulizers (COMPRESSOR/NEBULIZER) MISC Use as directed 1 each 0  . traMADol (ULTRAM) 50 MG tablet Take 50 mg by mouth every 6 (six) hours as needed (1 or 2).    . VENTOLIN HFA 108 (90 Base) MCG/ACT inhaler INHALE 2 PUFFS INTO THE LUNGS EVERY 6 HOURS AS  NEEDED FOR WHEEZING OR SHORTNESS OF BREATH. 18 g 12   No current facility-administered medications on file prior to visit.      Review of Systems  Constitutional: Negative for activity change, appetite change, fatigue, fever and unexpected weight change.  HENT: Negative for congestion, ear pain, rhinorrhea, sinus pressure and sore throat.   Eyes: Negative  for pain, redness and visual disturbance.  Respiratory: Positive for shortness of breath and wheezing. Negative for cough.   Cardiovascular: Negative for chest pain and palpitations.  Gastrointestinal: Negative for abdominal pain, blood in stool, constipation and diarrhea.  Endocrine: Negative for polydipsia and polyuria.  Genitourinary: Negative for dysuria, frequency and urgency.  Musculoskeletal: Positive for arthralgias. Negative for back pain and myalgias.  Skin: Negative for pallor and rash.  Allergic/Immunologic: Negative for environmental allergies.  Neurological: Negative for dizziness, syncope and headaches.  Hematological: Negative for adenopathy. Does not bruise/bleed easily.  Psychiatric/Behavioral: Negative for decreased concentration and dysphoric mood. The patient is not nervous/anxious.        Objective:   Physical Exam  Constitutional: She appears well-developed and well-nourished. No distress.  obese and well appearing   HENT:  Head: Normocephalic and atraumatic.  Mouth/Throat: Oropharynx is clear and moist.  Eyes: Conjunctivae and EOM are normal. Pupils are equal, round, and reactive to light.  Neck: Normal range of motion. Neck supple. No JVD present. Carotid bruit is not present. No thyromegaly present.  Cardiovascular: Normal rate, regular rhythm, normal heart sounds and intact distal pulses. Exam reveals no gallop.  Pulmonary/Chest: Effort normal and breath sounds normal. No respiratory distress. She has no wheezes. She has no rales.  No crackles  Good air exch No wheezing today  Abdominal: Soft. Bowel  sounds are normal. She exhibits no distension, no abdominal bruit and no mass. There is no tenderness.  Musculoskeletal: She exhibits no edema or tenderness.  Lymphadenopathy:    She has no cervical adenopathy.  Neurological: She is alert. She has normal reflexes. No cranial nerve deficit. She exhibits normal muscle tone. Coordination normal.  Skin: Skin is warm and dry. No rash noted. No pallor.  Psychiatric: She has a normal mood and affect.          Assessment & Plan:   Problem List Items Addressed This Visit      Cardiovascular and Mediastinum   Essential hypertension - Primary    bp in fair control at this time  BP Readings from Last 1 Encounters:  06/28/17 136/82   No changes needed Disc lifstyle change with low sodium diet and exercise  Labs reviewed  Enc wt loss       Relevant Medications   losartan-hydrochlorothiazide (HYZAAR) 100-25 MG tablet     Respiratory   Moderate intermittent asthma    Struggling more lately with weather change No c/o of gerd F/u with pulm soon as planned         Other   Alkaline phosphatase elevation    Chronic/fluctuating In 150s  May be up due to knee replacement  Continue to follow       Hyperglycemia    Lab Results  Component Value Date   HGBA1C 6.1 06/08/2017   No change from a year ago disc imp of low glycemic diet and wt loss to prevent DM2  Handouts given       Hyperlipidemia    LDL 116 with good HDL  Disc goals for lipids and reasons to control them Rev labs with pt Rev low sat fat diet in detail       Relevant Medications   losartan-hydrochlorothiazide (HYZAAR) 100-25 MG tablet   Obesity    Discussed how this problem influences overall health and the risks it imposes  Reviewed plan for weight loss with lower calorie diet (via better food choices and also portion control or program like weight watchers) and exercise  building up to or more than 30 minutes 5 days per week including some aerobic activity

## 2017-06-28 NOTE — Patient Instructions (Addendum)
For prevention of diabetes   Try to get most of your carbohydrates from produce (with the exception of white potatoes)  Eat less bread/pasta/rice/snack foods/cereals/sweets and other items from the middle of the grocery store (processed carbs)   Take care of yourself  No change in medications  Labs are fairly stable

## 2017-06-29 NOTE — Assessment & Plan Note (Signed)
bp in fair control at this time  BP Readings from Last 1 Encounters:  06/28/17 136/82   No changes needed Disc lifstyle change with low sodium diet and exercise  Labs reviewed  Enc wt loss

## 2017-06-29 NOTE — Assessment & Plan Note (Signed)
Struggling more lately with weather change No c/o of gerd F/u with pulm soon as planned

## 2017-06-29 NOTE — Assessment & Plan Note (Signed)
Discussed how this problem influences overall health and the risks it imposes  Reviewed plan for weight loss with lower calorie diet (via better food choices and also portion control or program like weight watchers) and exercise building up to or more than 30 minutes 5 days per week including some aerobic activity    

## 2017-06-29 NOTE — Assessment & Plan Note (Signed)
LDL 116 with good HDL  Disc goals for lipids and reasons to control them Rev labs with pt Rev low sat fat diet in detail

## 2017-06-29 NOTE — Assessment & Plan Note (Signed)
Lab Results  Component Value Date   HGBA1C 6.1 06/08/2017   No change from a year ago disc imp of low glycemic diet and wt loss to prevent DM2  Handouts given

## 2017-06-29 NOTE — Assessment & Plan Note (Signed)
Chronic/fluctuating In 150s  May be up due to knee replacement  Continue to follow

## 2017-07-06 ENCOUNTER — Encounter: Payer: Self-pay | Admitting: Internal Medicine

## 2017-07-06 ENCOUNTER — Other Ambulatory Visit: Payer: 59

## 2017-07-06 ENCOUNTER — Ambulatory Visit: Payer: 59 | Admitting: Internal Medicine

## 2017-07-06 VITALS — BP 122/68 | HR 92 | Ht 61.0 in | Wt 200.6 lb

## 2017-07-06 DIAGNOSIS — IMO0001 Reserved for inherently not codable concepts without codable children: Secondary | ICD-10-CM

## 2017-07-06 DIAGNOSIS — R0609 Other forms of dyspnea: Principal | ICD-10-CM

## 2017-07-06 DIAGNOSIS — R0600 Dyspnea, unspecified: Secondary | ICD-10-CM

## 2017-07-06 DIAGNOSIS — J45901 Unspecified asthma with (acute) exacerbation: Secondary | ICD-10-CM | POA: Diagnosis not present

## 2017-07-06 DIAGNOSIS — J4521 Mild intermittent asthma with (acute) exacerbation: Secondary | ICD-10-CM

## 2017-07-06 MED ORDER — FLUTICASONE-UMECLIDIN-VILANT 100-62.5-25 MCG/INH IN AEPB
1.0000 | INHALATION_SPRAY | Freq: Every day | RESPIRATORY_TRACT | 0 refills | Status: DC
Start: 2017-07-06 — End: 2017-07-26

## 2017-07-06 NOTE — Patient Instructions (Signed)
Order- lab- D-dimer   Dx  Dyspnea on exertion  Order- Office spirometry    Dx asthma exacerbation  Sample Trelegy Ellipta    Inhale 1 puff, then rinse mouth.    Try this instead of Breo. When the sample runs out, go back to [facility] for comparison.

## 2017-07-06 NOTE — Assessment & Plan Note (Signed)
She has not been able to modify lifestyle to accomplish significant weight loss. Now that she has had second knee replacement, hopefully she will be more mobile.

## 2017-07-06 NOTE — Progress Notes (Signed)
Patient ID: Katherine Tanner, female    DOB: Oct 11, 1960, 56 y.o.   MRN: [id number]  HPI female never smoker followed for Allergic rhinitis, Asthma, complicated by GERD Office spirometry: 11/24/2014: WNL FVC 2.39/97%, FEV1 2.09/103%, FEV1/FVC 0.88, FEF 25-75 percent 3.27/122% Office Spirometry 07/06/17-WNL-FVC 2.25/91%, FEV1 2.04/104%, ratio 0.91, FEF 25-75% 3.62/173% ---------------------------------------------------------------------------------- 06/13/2016-57 year old female never smoker followed for Allergic rhinitis, Asthma, complicated by GERD Follows for asthma 6 month follow up. Again declines flu vaccine as "my body doesn't like it" No major exacerbations since last here. Bilateral rib cage feels "sore" when out in cold weather but without significant cough or wheeze. Rare use of nebulizer machine. Needs refill rescue inhaler and Singulair. Denies sleep disturbance. Ears feel "wet"  07/06/17- 57 year old female never smoker followed for Allergic rhinitis, Asthma, complicated by GERD  ----60yr f/u for asthma. States breathing has been ok since last visit.  Ventolin HFA, Singulair, DuoNeb, Breo 200 Over the last 6 weeks "since the snowstorm" she has been more dyspneic going in and out of doors. Thinks his temperature change rather than simple exertion. No cough and not much obvious wheeze. Using her Ventolin inhaler once or twice daily, DuoNeb occasionally. No complication from left TKR in June. No history of blood clot or cardiac problem. CXR 12/19/16- IMPRESSION: There is no active cardiopulmonary disease. Office Spirometry 07/06/17-WNL-FVC 2.25/91%, FEV1 2.04/104%, ratio 0.91, FEF 25-75% 3.62/173%  //Consider PFT, consider methacholine//  Review of Systems-Per HPI.   + = pos Constitutional:   No-   weight loss, night sweats, fevers, chills, fatigue, lassitude. HEENT:   +  headaches, difficulty swallowing, tooth/dental problems, sore throat,       No-sneezing, itching, no-ear  ache, + nasal congestion, no- post nasal drip,  CV:  + chest pain, orthopnea, PND, swelling in lower extremities, anasarca, dizziness, palpitations Resp: No-   shortness of breath with exertion or at rest.             productive cough,  No- non-productive cough,  No- coughing up of blood.              No-   change in color of mucus. No- wheezing.   Skin: + rash GI:  No-   heartburn, indigestion, abdominal pain, nausea, vomiting,  GU: . MS:  No-   joint pain or swelling.  . Neuro-     nothing unusual Psych:  No- change in mood or affect. No depression or anxiety.  No memory loss.  Objective:   Physical Exam General- Alert, Oriented, Affect-appropriate, Distress- none acute; + Obese Skin- rash-none, lesions- none, excoriation- none Lymphadenopathy- none Head- atraumatic            Eyes- Gross vision intact, PERRLA, conjunctivae clear, no discolored secretions            Ears- Hearing, canals-normal            Nose-+ Sniffing, no-Septal dev, mucus, polyps, erosion, perforation             Throat- Mallampati III , mucosa clear , drainage- none, tonsils- atrophic Neck- flexible , trachea midline, no stridor , thyroid nl, carotid no bruit Chest - symmetrical excursion , unlabored           Heart/CV- RRR , no murmur , no gallop  , no rub, nl s1 s2                           - JVD- none ,  edema- none, stasis changes- none, varices- none           Lung- clear to P&A, wheeze- none, cough -none , dullness-none, rub- none, +room air saturation                            99%           Chest wall-  Abd-  Br/ Gen/ Rectal- Not done, not indicated Extrem- cyanosis- none, clubbing, none, atrophy- none, strength- nl Neuro- grossly intact to observation

## 2017-07-06 NOTE — Assessment & Plan Note (Signed)
I'm not sure we can consider her dyspnea on exertion to be asthma without obstruction on PFT or more obvious response to bronchodilator. She has left from this visit. Consider methacholine challenge in the future. . Today she is given sample Trelegy for trial and comparison

## 2017-07-07 ENCOUNTER — Other Ambulatory Visit (HOSPITAL_COMMUNITY): Payer: Self-pay | Admitting: Lab

## 2017-07-07 ENCOUNTER — Telehealth: Payer: Self-pay | Admitting: Internal Medicine

## 2017-07-07 ENCOUNTER — Other Ambulatory Visit (HOSPITAL_COMMUNITY)
Admission: RE | Admit: 2017-07-07 | Discharge: 2017-07-07 | Disposition: A | Discharge status: Home / Self Care | Payer: 59 | Source: Ambulatory Visit | Attending: Internal Medicine | Admitting: Internal Medicine

## 2017-07-07 ENCOUNTER — Other Ambulatory Visit: Payer: Self-pay | Admitting: Internal Medicine

## 2017-07-07 ENCOUNTER — Ambulatory Visit (INDEPENDENT_AMBULATORY_CARE_PROVIDER_SITE_OTHER)
Admission: RE | Admit: 2017-07-07 | Discharge: 2017-07-07 | Disposition: A | Discharge status: Home / Self Care | Payer: 59 | Source: Ambulatory Visit | Attending: Internal Medicine | Admitting: Internal Medicine

## 2017-07-07 DIAGNOSIS — R7989 Other specified abnormal findings of blood chemistry: Secondary | ICD-10-CM

## 2017-07-07 LAB — CBC WITH DIFFERENTIAL/PLATELET
BASOS PCT: 0 %
Basophils Absolute: 0 10*3/uL (ref 0.0–0.1)
EOS ABS: 0.1 10*3/uL (ref 0.0–0.7)
EOS PCT: 1 %
HCT: 41 % (ref 36.0–46.0)
HEMOGLOBIN: 13.6 g/dL (ref 12.0–15.0)
Lymphocytes Relative: 24 %
Lymphs Abs: 1.8 10*3/uL (ref 0.7–4.0)
MCH: 28.8 pg (ref 26.0–34.0)
MCHC: 33.2 g/dL (ref 30.0–36.0)
MCV: 86.7 fL (ref 78.0–100.0)
MONOS PCT: 10 %
Monocytes Absolute: 0.7 10*3/uL (ref 0.1–1.0)
NEUTROS PCT: 65 %
Neutro Abs: 4.8 10*3/uL (ref 1.7–7.7)
PLATELETS: 163 10*3/uL (ref 150–400)
RBC: 4.73 MIL/uL (ref 3.87–5.11)
RDW: 14.1 % (ref 11.5–15.5)
WBC: 7.4 10*3/uL (ref 4.0–10.5)

## 2017-07-07 LAB — BASIC METABOLIC PANEL
Anion gap: 7 (ref 5–15)
BUN: 14 mg/dL (ref 6–20)
CALCIUM: 9.6 mg/dL (ref 8.9–10.3)
CO2: 30 mmol/L (ref 22–32)
Chloride: 101 mmol/L (ref 101–111)
Creatinine, Ser: 0.71 mg/dL (ref 0.44–1.00)
GFR calc non Af Amer: >60 mL/min (ref >60)
Glucose, Bld: 141 mg/dL — ABNORMAL HIGH (ref 65–99)
Potassium: 3.3 mmol/L — ABNORMAL LOW (ref 3.5–5.1)
SODIUM: 138 mmol/L (ref 135–145)

## 2017-07-07 LAB — D-DIMER, QUANTITATIVE: D-Dimer, Quant: 1.35 mcg/mL FEU — ABNORMAL HIGH (ref <0.50)

## 2017-07-07 MED ORDER — IOPAMIDOL (ISOVUE-370) INJECTION 76%
75.0000 mL | Freq: Once | INTRAVENOUS | Status: AC | PRN
  Administered 2017-07-07: 75 mL via INTRAVENOUS

## 2017-07-07 NOTE — Telephone Encounter (Signed)
Changed labs to be collected at [facility]. Pt notified of change.

## 2017-07-10 ENCOUNTER — Telehealth: Payer: Self-pay | Admitting: Internal Medicine

## 2017-07-10 ENCOUNTER — Telehealth: Payer: Self-pay | Admitting: Family Medicine

## 2017-07-10 NOTE — Telephone Encounter (Signed)
Done

## 2017-07-10 NOTE — Telephone Encounter (Signed)
Per lab tab Dr Janee Morn lab test on pt are there for DOS 07/07/17. Pt last saw Dr Glori Bickers 06/28/17. Please advise.

## 2017-07-10 NOTE — Telephone Encounter (Signed)
Rec'd completed forms back from [place] to [place] via interoffice mail 07/10/17-pr

## 2017-07-10 NOTE — Telephone Encounter (Signed)
Tests look re assuring -spirometry and CT scan  Thanks

## 2017-07-10 NOTE — Telephone Encounter (Signed)
Copied from [place]. Topic: Quick Communication - See Telephone Encounter >> Jul 10, 2017  3:26 PM Vernona Rieger wrote: CRM for notification. See Telephone encounter for:   07/10/17.  Per Patient, Dr Annamaria Boots wanted to let Dr Glori Bickers know that he had run some test on her and did blood work. Her call back is [phone]

## 2017-07-25 ENCOUNTER — Other Ambulatory Visit: Payer: Self-pay

## 2017-07-26 ENCOUNTER — Ambulatory Visit (INDEPENDENT_AMBULATORY_CARE_PROVIDER_SITE_OTHER): Payer: 59 | Admitting: Family Medicine

## 2017-07-26 ENCOUNTER — Encounter: Payer: Self-pay | Admitting: Family Medicine

## 2017-07-26 VITALS — BP 124/70 | HR 73 | Temp 98.2°F | Wt 197.8 lb

## 2017-07-26 DIAGNOSIS — H9203 Otalgia, bilateral: Secondary | ICD-10-CM

## 2017-07-26 NOTE — Progress Notes (Signed)
Subjective:    Patient ID: Katherine Tanner, female    DOB: January 25, 1961, 57 y.o.   MRN: [id number]  HPI This is a 57 yo female who presents today with bilateral ear pain. Ears were bothering her since after Christmas. Feels throbbing at base of ear bilaterally. No fever or nasal congestion. Has been having more difficulty with asthma, using inhaler most days, rare nebulizer use. Following with Dr. Annamaria Boots (pulmonary). Uses flonase daily. Took some sudafed last night, not sure if it helped, she went to sleep.  Takes tramadol and Relafen.  Has been working on diet, loosing weight.   Past Medical History:  Diagnosis Date  . Allergy    takes Claritin daily and Flonase daily  . Arthritis   . Asthma    uses Albuterol daily as needed;Singulair nightly;DUlera daily  . GERD (gastroesophageal reflux disease)    occasionally will take OTC meds but states she just watches what she eats  . H/O hiatal hernia   . History of bronchitis    last time 47yrs ago  . History of colon polyps   . History of kidney stones   . History of shingles   . Hypertension    takes Hyzaar daily  . Joint pain   . Joint swelling   . Pneumonia    hx of;last time at least 11yrs ago  . PONV (postoperative nausea and vomiting)    Past Surgical History:  Procedure Laterality Date  . BREAST EXCISIONAL BIOPSY Right   . CARDIAC CATHETERIZATION    . COLONOSCOPY    . ESOPHAGOGASTRODUODENOSCOPY    . KNEE SURGERY Right    arthroscopy  . Right ganglion cyst     x 2  . right lumpectomy  around 1983  . STERIOD INJECTION Left 11/15/2013   Procedure: Marcaine/STEROID INJECTION;  Surgeon: Alta Corning, MD;  Location: [place];  Service: Orthopedics;  Laterality: Left;  . TOTAL KNEE ARTHROPLASTY Right 11/15/2013   Procedure: RIGHT TOTAL KNEE ARTHROPLASTY;  Surgeon: Alta Corning, MD;  Location: [place];  Service: Orthopedics;  Laterality: Right;  . TOTAL KNEE ARTHROPLASTY Left 12/30/2016   Procedure: TOTAL KNEE ARTHROPLASTY;   Surgeon: Dorna Leitz, MD;  Location: [place];  Service: Orthopedics;  Laterality: Left;  . TUBAL LIGATION     Family History  Problem Relation Age of Onset  . Diabetes Father   . Sarcoidosis Mother   . Glaucoma Mother   . Hypertension Mother   . Breast cancer Neg Hx    Social History   Tobacco Use  . Smoking status: Never Smoker  . Smokeless tobacco: Never Used  Substance Use Topics  . Alcohol use: No    Alcohol/week: 0.0 oz  . Drug use: No      Review of Systems Per HPI    Objective:   Physical Exam  Constitutional: She is oriented to person, place, and time. She appears well-developed and well-nourished. No distress.  HENT:  Head: Normocephalic and atraumatic.  Right Ear: Ear canal normal. No drainage or tenderness. Tympanic membrane is not injected and not erythematous.  Left Ear: Ear canal normal. No drainage or tenderness. Tympanic membrane is not injected and not erythematous.  Nose: Nose normal.  Mouth/Throat: Oropharynx is clear and moist. No trismus in the jaw. No oropharyngeal exudate.  Bilateral TMs dull. Non tender to exam.   Eyes: Conjunctivae are normal.  Neck: Normal range of motion. Neck supple.  Cardiovascular: Normal rate, regular rhythm and normal heart sounds.  Pulmonary/Chest:  Effort normal and breath sounds normal.  Neurological: She is alert and oriented to person, place, and time.  Skin: Skin is warm and dry. She is not diaphoretic.  Psychiatric: She has a normal mood and affect. Her behavior is normal. Judgment and thought content normal.  Vitals reviewed.     BP 124/70   Pulse 73   Temp 98.2 F (36.8 C) (Oral)   Wt 197 lb 12 oz (89.7 kg)   LMP 02/20/2014   SpO2 98%   BMI 37.36 kg/m  Wt Readings from Last 3 Encounters:  07/26/17 197 lb 12 oz (89.7 kg)  07/06/17 200 lb 9.6 oz (91 kg)  06/28/17 202 lb 4 oz (91.7 kg)       Assessment & Plan:  1. Otalgia of both ears - TMs a little dull, but otherwise unremarkable exam. She is  already taking NSAID and tramadol. Will have her add some nasal spray and discussed using decongestants sparingly. If no improvement can put in ENT referral.  - Provided written and verbal information regarding diagnosis and treatment. -  Patient Instructions  I hope you are feeling better soon!  Try Afrin type nasal spray twice a day for 4 days maximum  Can use sudafed sparingly- one in morning and one after lunch  Please let us know if not better in a couple of days        Clarene Reamer, FNP-BC  [facility] [place]  07/27/2017 2:39 PM

## 2017-07-26 NOTE — Patient Instructions (Signed)
I hope you are feeling better soon!  Try Afrin type nasal spray twice a day for 4 days maximum  Can use sudafed sparingly- one in morning and one after lunch  Please let us know if not better in a couple of days

## 2017-07-27 ENCOUNTER — Encounter: Payer: Self-pay | Admitting: Family Medicine

## 2017-07-27 DIAGNOSIS — H16223 Keratoconjunctivitis sicca, not specified as Sjogren's, bilateral: Secondary | ICD-10-CM | POA: Diagnosis not present

## 2017-07-27 DIAGNOSIS — H5203 Hypermetropia, bilateral: Secondary | ICD-10-CM | POA: Diagnosis not present

## 2017-07-27 DIAGNOSIS — H04123 Dry eye syndrome of bilateral lacrimal glands: Secondary | ICD-10-CM | POA: Diagnosis not present

## 2017-07-27 DIAGNOSIS — H524 Presbyopia: Secondary | ICD-10-CM | POA: Diagnosis not present

## 2017-07-31 ENCOUNTER — Other Ambulatory Visit: Payer: Self-pay | Admitting: Family Medicine

## 2017-07-31 ENCOUNTER — Ambulatory Visit: Payer: Self-pay

## 2017-07-31 DIAGNOSIS — H9203 Otalgia, bilateral: Secondary | ICD-10-CM

## 2017-07-31 NOTE — Telephone Encounter (Signed)
I spoke with pt and ears are no better still throbbing. Pt would like referral for ENT to [facility]. Please advise. Pt last seen 07/26/17.

## 2017-07-31 NOTE — Telephone Encounter (Signed)
  Reason for Disposition . Earache  (Exceptions: brief ear pain of < 60 minutes duration, earache occurring during air travel  Answer Assessment - Initial Assessment Questions 1. LOCATION: "Which ear is involved?"     Right ear 2. ONSET: "When did the ear start hurting"      07/26/17 3. SEVERITY: "How bad is the pain?"  (Scale 1-10; mild, moderate or severe)   - MILD (1-3): doesn't interfere with normal activities    - MODERATE (4-7): interferes with normal activities or awakens from sleep    - SEVERE (8-10): excruciating pain, unable to do any normal activities      6 4. URI SYMPTOMS: " Do you have a runny nose or cough?"     No 5. FEVER: "Do you have a fever?" If so, ask: "What is your temperature, how was it measured, and when did it start?"     No 6. CAUSE: "Have you been swimming recently?", "How often do you use Q-TIPS?", "Have you had any recent air travel or scuba diving?"     Unsure 7. OTHER SYMPTOMS: "Do you have any other symptoms?" (e.g., headache, stiff neck, dizziness, vomiting, runny nose, decreased hearing)     No 8. PREGNANCY: "Is there any chance you are pregnant?" "When was your last menstrual period?"     No  Protocols used: EARACHE-A-AH Pt. Reports her ear is throbbing. Having pain since December.

## 2017-07-31 NOTE — Telephone Encounter (Signed)
Opened in error.  See triage encounter from today by Linus Orn.

## 2017-07-31 NOTE — Telephone Encounter (Signed)
Please let patient know that a referral has been ordered.

## 2017-07-31 NOTE — Progress Notes (Signed)
Referral has been placed for ENT

## 2017-08-01 ENCOUNTER — Other Ambulatory Visit: Payer: Self-pay | Admitting: Internal Medicine

## 2017-08-01 MED FILL — traMADol HCL 50 MG TABS: 50 | 15 days supply | Qty: 120 | Fill #0

## 2017-08-01 MED FILL — NABUMETONE 750 MG TABLET: 750 | 30 days supply | Qty: 60 | Fill #0

## 2017-08-01 MED FILL — BREO ELLIPTA 100-25 MCG INH: 100-25 | 30 days supply | Qty: 60 | Fill #0

## 2017-08-02 NOTE — Telephone Encounter (Signed)
Lm on pts vm and advised referral placed. Pt informed to await a call with appt details

## 2017-08-03 DIAGNOSIS — H9201 Otalgia, right ear: Secondary | ICD-10-CM | POA: Diagnosis not present

## 2017-08-03 DIAGNOSIS — M26621 Arthralgia of right temporomandibular joint: Secondary | ICD-10-CM | POA: Diagnosis not present

## 2017-08-03 MED FILL — IBUPROFEN 800 MG TAB: 800 | 14 days supply | Qty: 42 | Fill #0

## 2017-08-17 DIAGNOSIS — M25562 Pain in left knee: Secondary | ICD-10-CM | POA: Diagnosis not present

## 2017-08-25 MED FILL — LOSARTAN POTASSIUM-HCTZ 100: 100-25 | 90 days supply | Qty: 90 | Fill #0

## 2017-08-25 MED FILL — BREO ELLIPTA 100-25 MCG INH: 100-25 | 30 days supply | Qty: 60 | Fill #1

## 2017-09-21 MED FILL — BREO ELLIPTA 100-25 MCG INH: 100-25 | 30 days supply | Qty: 60 | Fill #2

## 2017-09-21 MED FILL — MONTELUKAST SOD 10 MG TAB: 10 | 90 days supply | Qty: 90 | Fill #1

## 2017-09-22 MED FILL — traMADol HCL 50 MG TABS: 50 | 15 days supply | Qty: 120 | Fill #0

## 2017-10-03 MED FILL — NABUMETONE 750 MG TABLET: 750 | 30 days supply | Qty: 60 | Fill #1

## 2017-10-09 ENCOUNTER — Ambulatory Visit: Payer: 59 | Admitting: Internal Medicine

## 2017-10-09 ENCOUNTER — Encounter: Payer: Self-pay | Admitting: Internal Medicine

## 2017-10-09 ENCOUNTER — Other Ambulatory Visit (INDEPENDENT_AMBULATORY_CARE_PROVIDER_SITE_OTHER): Payer: 59

## 2017-10-09 VITALS — BP 128/74 | HR 71 | Ht 61.0 in | Wt 193.0 lb

## 2017-10-09 DIAGNOSIS — IMO0001 Reserved for inherently not codable concepts without codable children: Secondary | ICD-10-CM

## 2017-10-09 DIAGNOSIS — J4520 Mild intermittent asthma, uncomplicated: Secondary | ICD-10-CM | POA: Diagnosis not present

## 2017-10-09 DIAGNOSIS — R0609 Other forms of dyspnea: Secondary | ICD-10-CM | POA: Diagnosis not present

## 2017-10-09 DIAGNOSIS — R0600 Dyspnea, unspecified: Secondary | ICD-10-CM

## 2017-10-09 DIAGNOSIS — J302 Other seasonal allergic rhinitis: Secondary | ICD-10-CM | POA: Diagnosis not present

## 2017-10-09 DIAGNOSIS — J3089 Other allergic rhinitis: Secondary | ICD-10-CM | POA: Diagnosis not present

## 2017-10-09 DIAGNOSIS — R0683 Snoring: Secondary | ICD-10-CM | POA: Diagnosis not present

## 2017-10-09 LAB — CBC WITH DIFFERENTIAL/PLATELET
BASOS ABS: 0.1 10*3/uL (ref 0.0–0.1)
BASOS PCT: 0.8 % (ref 0.0–3.0)
Eosinophils Absolute: 0.1 10*3/uL (ref 0.0–0.7)
Eosinophils Relative: 1.4 % (ref 0.0–5.0)
HEMATOCRIT: 40.3 % (ref 36.0–46.0)
HEMOGLOBIN: 13.5 g/dL (ref 12.0–15.0)
LYMPHS PCT: 26.1 % (ref 12.0–46.0)
Lymphs Abs: 1.9 10*3/uL (ref 0.7–4.0)
MCHC: 33.4 g/dL (ref 30.0–36.0)
MCV: 87.3 fl (ref 78.0–100.0)
Monocytes Absolute: 0.8 10*3/uL (ref 0.1–1.0)
Monocytes Relative: 11.7 % (ref 3.0–12.0)
Neutro Abs: 4.3 10*3/uL (ref 1.4–7.7)
Neutrophils Relative %: 60 % (ref 43.0–77.0)
Platelets: 167 10*3/uL (ref 150.0–400.0)
RBC: 4.61 Mil/uL (ref 3.87–5.11)
RDW: 14 % (ref 11.5–15.5)
WBC: 7.2 10*3/uL (ref 4.0–10.5)

## 2017-10-09 LAB — NITRIC OXIDE: NITRIC OXIDE: 11

## 2017-10-09 NOTE — Patient Instructions (Signed)
Order- lab- CBC w diff, Allergy profile    Dx Asthma moderate persistent  Order- schedule PFT  Order- FENO  Order unattended home sleep test  Please call me about 2 weeks after the sleep test for results and recommendations. If appropriate, we might be able to start treatment before ai see you next.

## 2017-10-09 NOTE — Progress Notes (Signed)
Patient ID: Katherine Tanner, female    DOB: Oct 11, 1960, 57 y.o.   MRN: [id number]  HPI female never smoker followed for Allergic rhinitis, Asthma, complicated by GERD Office spirometry: 11/24/2014: WNL FVC 2.39/97%, FEV1 2.09/103%, FEV1/FVC 0.88, FEF 25-75 percent 3.27/122% Office Spirometry 07/06/17-WNL-FVC 2.25/91%, FEV1 2.04/104%, ratio 0.91, FEF 25-75% 3.62/173% FENO 10/09/17   11   Not elevated ---------------------------------------------------------------------------------- 07/06/17- 57 year old female never smoker followed for Allergic rhinitis, Asthma, complicated by GERD  ----40yr f/u for asthma. States breathing has been ok since last visit.  Ventolin HFA, Singulair, DuoNeb, Breo 200 Over the last 6 weeks "since the snowstorm" she has been more dyspneic going in and out of doors. Thinks his temperature change rather than simple exertion. No cough and not much obvious wheeze. Using her Ventolin inhaler once or twice daily, DuoNeb occasionally. No complication from left TKR in June. No history of blood clot or cardiac problem. CXR 12/19/16- IMPRESSION: There is no active cardiopulmonary disease. Office Spirometry 07/06/17-WNL-FVC 2.25/91%, FEV1 2.04/104%, ratio 0.91, FEF 25-75% 3.62/173%  10/09/2017- 57 year old female never smoker followed for Allergic rhinitis, Asthma, complicated by GERD  ----31yr f/u for asthma. States breathing has been ok since last visit.  Ventolin HFA, Singulair, DuoNeb, Breo 100 ----FOLLOWS FOR: c/o worsening dyspnea, chest tightness.  Coughs when lying supine but does not recognize reflux or dysphagia.  Little sputum.  Feels tighter/more short of breath going in and out of doors "change of air".  Using rescue inhaler occasionally.  Wakes  with anterior chest wall soreness and is aware that she snores.  Sleep study was negative many years ago. Trelegy seem to have no effect.Breo 200 is about the same as Breo 100. FENO 10/09/17   11   Not elevated CT chest  07/07/2017 Negative for pulmonary embolism or other specific cause of symptoms.   Review of Systems-Per HPI.   + = pos Constitutional:   No-   weight loss, night sweats, fevers, chills, fatigue, lassitude. HEENT:   +  headaches, difficulty swallowing, tooth/dental problems, sore throat,       No-sneezing, itching, no-ear ache, + nasal congestion, no- post nasal drip,  CV:  + chest pain, orthopnea, PND, swelling in lower extremities, anasarca, dizziness, palpitations Resp: No-   shortness of breath with exertion or at rest.             productive cough,  No- non-productive cough,  No- coughing up of blood.              No-   change in color of mucus. No- wheezing.   Skin:  GI:  No-   heartburn, indigestion, abdominal pain, nausea, vomiting,  GU: . MS:  No-   joint pain or swelling.  . Neuro-     nothing unusual Psych:  No- change in mood or affect. No depression or anxiety.  No memory loss.  Objective:   Physical Exam General- Alert, Oriented, Affect-appropriate, Distress- none acute; + Obese Skin- rash-none, lesions- none, excoriation- none Lymphadenopathy- none Head- atraumatic            Eyes- Gross vision intact, PERRLA, conjunctivae clear, no discolored secretions            Ears- Hearing, canals-normal            Nose-+ Sniffing, no-Septal dev, mucus, polyps, erosion, perforation             Throat- Mallampati III , mucosa clear , drainage- none, tonsils- atrophic Neck- flexible , trachea midline,  no stridor , thyroid nl, carotid no bruit Chest - symmetrical excursion , unlabored           Heart/CV- RRR , no murmur , no gallop  , no rub, nl s1 s2                           - JVD- none , edema- none, stasis changes- none, varices- none           Lung-+ coarse breath sounds without wheeze or rhonchi, unlabored, wheeze- none, cough                         -none , dullness-none, rub- none,            Chest wall-  Abd-  Br/ Gen/ Rectal- Not done, not indicated Extrem- cyanosis-  none, clubbing, none, atrophy- none, strength- nl Neuro- grossly intact to observation

## 2017-10-10 DIAGNOSIS — G4733 Obstructive sleep apnea (adult) (pediatric): Secondary | ICD-10-CM | POA: Insufficient documentation

## 2017-10-10 LAB — ALLERGY PANEL, REGION 2, GRASSES
CLASS: 0
CLASS: 0
CLASS: 0
CLASS: 0
Class: 0
G005 Rye, Perennial: <0.1 kU/L
ORCHARD GRASS (COCKSFOOT) (G3) IGE: <0.1 kU/L

## 2017-10-10 LAB — INTERPRETATION:

## 2017-10-10 LAB — IGE: IGE (IMMUNOGLOBULIN E), SERUM: 83 kU/L (ref <=114)

## 2017-10-10 NOTE — Assessment & Plan Note (Signed)
Aware of snoring.  Aware of waking in the mornings with sore anterior chest wall muscles as if she has been struggling and pulling to breathe.  Remote sleep study was negative-gained weight since then. Plan-schedule home sleep test with discussion

## 2017-10-10 NOTE — Assessment & Plan Note (Signed)
Not having major exacerbation yet, now in peak tree pollen season. Plan-okay to use Flonase and antihistamines as needed.  Lab for CBC with differential and allergy profile

## 2017-10-10 NOTE — Assessment & Plan Note (Signed)
I want to be sure of diagnosis since she is not wheezing and has had normal spirometry's.  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 Plan-schedule PFT, lab for allergy profile

## 2017-10-26 MED FILL — BREO ELLIPTA 100-25 MCG INH: 100-25 | 30 days supply | Qty: 60 | Fill #3

## 2017-10-27 DIAGNOSIS — G4733 Obstructive sleep apnea (adult) (pediatric): Secondary | ICD-10-CM | POA: Diagnosis not present

## 2017-10-28 DIAGNOSIS — G4733 Obstructive sleep apnea (adult) (pediatric): Secondary | ICD-10-CM | POA: Diagnosis not present

## 2017-10-30 ENCOUNTER — Other Ambulatory Visit: Payer: Self-pay | Admitting: Obstetrics and Gynecology

## 2017-10-30 DIAGNOSIS — Z1231 Encounter for screening mammogram for malignant neoplasm of breast: Secondary | ICD-10-CM

## 2017-10-30 MED FILL — traMADol HCL 50 MG TABS: 50 | 15 days supply | Qty: 120 | Fill #0

## 2017-10-31 ENCOUNTER — Telehealth: Payer: Self-pay | Admitting: Internal Medicine

## 2017-10-31 MED ORDER — PREDNISONE 10 MG PO TABS: ORAL_TABLET | ORAL | 0 refills | Status: DC

## 2017-10-31 MED FILL — predniSONE 10 MG TABS: 10 | 7 days supply | Qty: 8 | Fill #0

## 2017-10-31 NOTE — Telephone Encounter (Signed)
Pt aware of recs.  rx sent to preferred pharmacy.  Nothing further needed.  

## 2017-10-31 NOTE — Telephone Encounter (Signed)
Suspect its the pollen, unless she thinks she has a cold.  Offer prednisone 10 mg, # 8, 2 today, then one daily

## 2017-10-31 NOTE — Telephone Encounter (Signed)
Called and spoke to patient. Patient asked if CY can recommend something. Patient reports that she is having increased shortness of breath, chest/lung burning, has been using nebulizer and rescue inhaler more often. Patient stated already today she has used her neb once and rescue inhaler twice.  CY please advise.  Allergies  Allergen Reactions  . Ace Inhibitors Shortness Of Breath and Cough    Cough/ breathing problems   . Amoxicillin-Pot Clavulanate Swelling    SWELLING REACTION UNSPECIFIED   . Influenza Virus Vacc Split Pf     UNSPECIFIED REACTION   . Shellfish Allergy Itching   Current Outpatient Medications on File Prior to Visit  Medication Sig Dispense Refill  . BREO ELLIPTA 100-25 MCG/INH AEPB INHALE 1 PUFF INTO THE LUNGS DAILY. RINSE MOUTH. 60 each 12  . Calcium Citrate-Vitamin D (CALCIUM + D PO) Take 1 tablet by mouth daily.    . fluticasone (FLONASE) 50 MCG/ACT nasal spray Place 2 sprays into both nostrils daily. 48 g 3  . guaiFENesin (MUCINEX) 600 MG 12 hr tablet Take 600 mg by mouth 2 (two) times daily as needed.     [name] ipratropium-albuterol (DUONEB) 0.5-2.5 (3) MG/3ML SOLN USE 1 VIAL VIA NEBULIZER EVERY 6 HOURS AS NEEDED 90 mL 12  . loratadine (CLARITIN) 10 MG tablet Take 10 mg by mouth daily.     [name] losartan-hydrochlorothiazide (HYZAAR) 100-25 MG tablet Take 1 tablet by mouth daily. 90 tablet 3  . montelukast (SINGULAIR) 10 MG tablet TAKE 1 TABLET BY MOUTH AT BEDTIME. 90 tablet 3  . Multiple Vitamin (MULTIVITAMIN PO) Take 1 tablet by mouth daily.     . nabumetone (RELAFEN) 750 MG tablet Take 750 mg by mouth 2 (two) times daily.    . Nebulizers (COMPRESSOR/NEBULIZER) MISC Use as directed 1 each 0  . traMADol (ULTRAM) 50 MG tablet Take 50 mg by mouth every 6 (six) hours as needed (1 or 2).    . VENTOLIN HFA 108 (90 Base) MCG/ACT inhaler INHALE 2 PUFFS INTO THE LUNGS EVERY 6 HOURS AS NEEDED FOR WHEEZING OR SHORTNESS OF BREATH. 18 g 12   No current facility-administered  medications on file prior to visit.

## 2017-11-01 ENCOUNTER — Other Ambulatory Visit: Payer: Self-pay | Admitting: *Deleted

## 2017-11-01 DIAGNOSIS — R0609 Other forms of dyspnea: Principal | ICD-10-CM

## 2017-11-01 DIAGNOSIS — R0600 Dyspnea, unspecified: Secondary | ICD-10-CM

## 2017-11-09 DIAGNOSIS — M25562 Pain in left knee: Secondary | ICD-10-CM | POA: Diagnosis not present

## 2017-11-09 MED FILL — ETODOLAC 400 MG TABLET: 400 | 30 days supply | Qty: 60 | Fill #0

## 2017-11-11 MED FILL — VENTOLIN HFA 90 MCG INHALER: 108 (90 BAS | 25 days supply | Qty: 18 | Fill #1

## 2017-11-11 MED FILL — IPRAT-ALBUT 0.5-3(2.5) MG/3: 0.5-2.5 (3) | 7 days supply | Qty: 90 | Fill #1

## 2017-11-13 ENCOUNTER — Other Ambulatory Visit: Payer: Self-pay | Admitting: Internal Medicine

## 2017-11-13 MED FILL — EPINEPHRINE 0.3 MG AUTO-INJ: 0.3 | 2 days supply | Qty: 2 | Fill #0

## 2017-11-17 ENCOUNTER — Ambulatory Visit
Admission: RE | Admit: 2017-11-17 | Discharge: 2017-11-17 | Disposition: A | Discharge status: Home / Self Care | Payer: 59 | Source: Ambulatory Visit | Attending: Obstetrics and Gynecology | Admitting: Obstetrics and Gynecology

## 2017-11-17 DIAGNOSIS — Z1231 Encounter for screening mammogram for malignant neoplasm of breast: Secondary | ICD-10-CM | POA: Diagnosis not present

## 2017-11-20 ENCOUNTER — Telehealth: Payer: Self-pay | Admitting: Internal Medicine

## 2017-11-20 MED ORDER — FLUTICASONE-SALMETEROL 250-50 MCG/DOSE IN AEPB: INHALATION_SPRAY | RESPIRATORY_TRACT | 11 refills | Status: DC

## 2017-11-20 MED FILL — ADVAIR 250/50 DISKUS: 250-50 | 30 days supply | Qty: 60 | Fill #0

## 2017-11-20 NOTE — Telephone Encounter (Signed)
Spoke with pt, she is having more SOB and wanted to see if she could change her inhaler. She states the Memory Dance is not working and suggested to try Advair or increasing her dose. Her insurance covers Advair and she recalls this working in the past. CY please  Katherine Tanner   Current Outpatient Medications on File Prior to Visit  Medication Sig Dispense Refill  . BREO ELLIPTA 100-25 MCG/INH AEPB INHALE 1 PUFF INTO THE LUNGS DAILY. RINSE MOUTH. 60 each 12  . Calcium Citrate-Vitamin D (CALCIUM + D PO) Take 1 tablet by mouth daily.    [name] EPINEPHRINE 0.3 mg/0.3 mL IJ SOAJ injection INJECT 1 SYRINGE INTO THE MUSCLE ONCE 1 Device 12  . fluticasone (FLONASE) 50 MCG/ACT nasal spray Place 2 sprays into both nostrils daily. 48 g 3  . guaiFENesin (MUCINEX) 600 MG 12 hr tablet Take 600 mg by mouth 2 (two) times daily as needed.     [name] ipratropium-albuterol (DUONEB) 0.5-2.5 (3) MG/3ML SOLN USE 1 VIAL VIA NEBULIZER EVERY 6 HOURS AS NEEDED 90 mL 12  . loratadine (CLARITIN) 10 MG tablet Take 10 mg by mouth daily.     [name] losartan-hydrochlorothiazide (HYZAAR) 100-25 MG tablet Take 1 tablet by mouth daily. 90 tablet 3  . montelukast (SINGULAIR) 10 MG tablet TAKE 1 TABLET BY MOUTH AT BEDTIME. 90 tablet 3  . Multiple Vitamin (MULTIVITAMIN PO) Take 1 tablet by mouth daily.     . nabumetone (RELAFEN) 750 MG tablet Take 750 mg by mouth 2 (two) times daily.    . Nebulizers (COMPRESSOR/NEBULIZER) MISC Use as directed 1 each 0  . predniSONE (DELTASONE) 10 MG tablet 2 tabs today, then 1 daily until gone. 8 tablet 0  . traMADol (ULTRAM) 50 MG tablet Take 50 mg by mouth every 6 (six) hours as needed (1 or 2).    . VENTOLIN HFA 108 (90 Base) MCG/ACT inhaler INHALE 2 PUFFS INTO THE LUNGS EVERY 6 HOURS AS NEEDED FOR WHEEZING OR SHORTNESS OF BREATH. 18 g 12   No current facility-administered medications on file prior to visit.    Allergies  Allergen Reactions  . Ace Inhibitors Shortness Of Breath and Cough    Cough/ breathing  problems   . Amoxicillin-Pot Clavulanate Swelling    SWELLING REACTION UNSPECIFIED   . Influenza Virus Vacc Split Pf     UNSPECIFIED REACTION   . Shellfish Allergy Itching

## 2017-11-20 NOTE — Telephone Encounter (Signed)
Spoke with pt and advised of Dr Young's recommendations.  Rx sent to pharmacy.  Pt verbalized understanding.  Nothing further needed.  

## 2017-11-20 NOTE — Telephone Encounter (Signed)
Ok to change to Advair 250 ("generic ok")    # 1,  Inhale 1 puff, then rinse mouth, once daily

## 2017-11-29 MED FILL — LOSARTAN-HCTZ 100-25 MG TAB: 100-25 | 90 days supply | Qty: 90 | Fill #1

## 2017-12-11 ENCOUNTER — Telehealth: Payer: Self-pay | Admitting: Internal Medicine

## 2017-12-11 NOTE — Telephone Encounter (Signed)
Notes recorded by Deneise Lever, MD on 12/06/2017 at 2:59 PM EDT Home sleep Test- showed mild obstructive sleep apnea, averaging 7 apneas/ hour, with drops in blood oxygen level. When we saw her last and ordered this test, she had been complaining of waking in the morning with sore chest as if she had been laboring to breathe. Suggest we try CPAP-  Order new DME, new CPAP auto 5-20, mask of choice, humidifier, supplies, AirView.  Please make sure she has a return ov in 31-90 days as required by insurance.   Called and spoke to pt and relayed above results. Pt stated she wishes to discuss therapy further at her upcoming OV on 01/15/18, as she is not currently having symptoms. Nothing further is needed.    Will route to CY as an [place].  Current Outpatient Medications on File Prior to Visit  Medication Sig Dispense Refill  . BREO ELLIPTA 100-25 MCG/INH AEPB INHALE 1 PUFF INTO THE LUNGS DAILY. RINSE MOUTH. 60 each 12  . Calcium Citrate-Vitamin D (CALCIUM + D PO) Take 1 tablet by mouth daily.    [name] EPINEPHRINE 0.3 mg/0.3 mL IJ SOAJ injection INJECT 1 SYRINGE INTO THE MUSCLE ONCE 1 Device 12  . fluticasone (FLONASE) 50 MCG/ACT nasal spray Place 2 sprays into both nostrils daily. 48 g 3  . Fluticasone-Salmeterol (ADVAIR) 250-50 MCG/DOSE AEPB Inhale 1 puff, then rinse mouth, once daily 60 each 11  . guaiFENesin (MUCINEX) 600 MG 12 hr tablet Take 600 mg by mouth 2 (two) times daily as needed.     [name] ipratropium-albuterol (DUONEB) 0.5-2.5 (3) MG/3ML SOLN USE 1 VIAL VIA NEBULIZER EVERY 6 HOURS AS NEEDED 90 mL 12  . loratadine (CLARITIN) 10 MG tablet Take 10 mg by mouth daily.     [name] losartan-hydrochlorothiazide (HYZAAR) 100-25 MG tablet Take 1 tablet by mouth daily. 90 tablet 3  . montelukast (SINGULAIR) 10 MG tablet TAKE 1 TABLET BY MOUTH AT BEDTIME. 90 tablet 3  . Multiple Vitamin (MULTIVITAMIN PO) Take 1 tablet by mouth daily.     . nabumetone (RELAFEN) 750 MG tablet Take 750 mg by mouth 2 (two)  times daily.    . Nebulizers (COMPRESSOR/NEBULIZER) MISC Use as directed 1 each 0  . predniSONE (DELTASONE) 10 MG tablet 2 tabs today, then 1 daily until gone. 8 tablet 0  . traMADol (ULTRAM) 50 MG tablet Take 50 mg by mouth every 6 (six) hours as needed (1 or 2).    . VENTOLIN HFA 108 (90 Base) MCG/ACT inhaler INHALE 2 PUFFS INTO THE LUNGS EVERY 6 HOURS AS NEEDED FOR WHEEZING OR SHORTNESS OF BREATH. 18 g 12   No current facility-administered medications on file prior to visit.     Allergies  Allergen Reactions  . Ace Inhibitors Shortness Of Breath and Cough    Cough/ breathing problems   . Amoxicillin-Pot Clavulanate Swelling    SWELLING REACTION UNSPECIFIED   . Influenza Virus Vacc Split Pf     UNSPECIFIED REACTION   . Shellfish Allergy Itching

## 2017-12-11 NOTE — Telephone Encounter (Signed)
OK 

## 2017-12-13 MED FILL — traMADol HCL 50 MG TABS: 50 | 7 days supply | Qty: 60 | Fill #0

## 2017-12-13 MED FILL — ETODOLAC 400 MG TABLET: 400 | 30 days supply | Qty: 60 | Fill #1

## 2018-01-02 MED FILL — CLINDAMYCIN HCL 300 MG CAPS: 300 | 4 days supply | Qty: 8 | Fill #0

## 2018-01-12 MED FILL — MONTELUKAST SOD 10 MG TAB: 10 | 90 days supply | Qty: 90 | Fill #2

## 2018-01-13 MED FILL — ADVAIR 250/50 DISKUS: 250-50 | 60 days supply | Qty: 60 | Fill #1

## 2018-01-15 ENCOUNTER — Encounter: Payer: Self-pay | Admitting: Internal Medicine

## 2018-01-15 ENCOUNTER — Ambulatory Visit (INDEPENDENT_AMBULATORY_CARE_PROVIDER_SITE_OTHER): Payer: 59 | Admitting: Internal Medicine

## 2018-01-15 ENCOUNTER — Ambulatory Visit: Payer: 59 | Admitting: Internal Medicine

## 2018-01-15 VITALS — BP 114/76 | HR 78 | Ht 61.0 in | Wt 197.0 lb

## 2018-01-15 DIAGNOSIS — R0600 Dyspnea, unspecified: Secondary | ICD-10-CM

## 2018-01-15 DIAGNOSIS — J4520 Mild intermittent asthma, uncomplicated: Secondary | ICD-10-CM

## 2018-01-15 DIAGNOSIS — R0609 Other forms of dyspnea: Secondary | ICD-10-CM | POA: Diagnosis not present

## 2018-01-15 DIAGNOSIS — G4733 Obstructive sleep apnea (adult) (pediatric): Secondary | ICD-10-CM

## 2018-01-15 DIAGNOSIS — IMO0001 Reserved for inherently not codable concepts without codable children: Secondary | ICD-10-CM

## 2018-01-15 LAB — PULMONARY FUNCTION TEST
DL/VA % pred: 128 %
DL/VA: 5.65 ml/min/mmHg/L
DLCO unc % pred: 104 %
DLCO unc: 21.09 ml/min/mmHg
FEF 25-75 Post: 3.5 L/sec
FEF 25-75 Pre: 3.09 L/sec
FEF2575-%CHANGE-POST: 13 %
FEF2575-%Pred-Post: 171 %
FEF2575-%Pred-Pre: 151 %
FEV1-%CHANGE-POST: 2 %
FEV1-%PRED-POST: 109 %
FEV1-%Pred-Pre: 107 %
FEV1-POST: 2.13 L
FEV1-PRE: 2.08 L
FEV1FVC-%CHANGE-POST: 5 %
FEV1FVC-%Pred-Pre: 109 %
FEV6-%Change-Post: 0 %
FEV6-%PRED-PRE: 97 %
FEV6-%Pred-Post: 96 %
FEV6-PRE: 2.3 L
FEV6-Post: 2.28 L
FEV6FVC-%Change-Post: 2 %
FEV6FVC-%PRED-PRE: 101 %
FEV6FVC-%Pred-Post: 103 %
FVC-%CHANGE-POST: -3 %
FVC-%PRED-POST: 93 %
FVC-%PRED-PRE: 96 %
FVC-POST: 2.3 L
FVC-PRE: 2.37 L
POST FEV6/FVC RATIO: 99 %
PRE FEV6/FVC RATIO: 97 %
Post FEV1/FVC ratio: 93 %
Pre FEV1/FVC ratio: 88 %
RV % PRED: 217 %
RV: 3.78 L
TLC % pred: 133 %
TLC: 6.12 L

## 2018-01-15 MED FILL — NABUMETONE 750 MG TABLET: 750 | 30 days supply | Qty: 60 | Fill #2

## 2018-01-15 NOTE — Progress Notes (Signed)
PFT done today. 

## 2018-01-15 NOTE — Patient Instructions (Signed)
Order- schedule methacholine inhalation challenge test ([facility] PFT lab)   Off bronchodilators that day   For minimal obstructive sleep apnea, consider weight loss, sleep position off back, chin strap, mouth piece for snoring  Please call as needed  Ok to continue your breathing meds if they help you

## 2018-01-15 NOTE — Progress Notes (Signed)
Patient ID: Katherine Tanner, female    DOB: 08/14/1960, 57 y.o.   MRN: [id number]  HPI female never smoker followed for Allergic rhinitis, Asthma, complicated by GERD Office spirometry: 11/24/2014: WNL FVC 2.39/97%, FEV1 2.09/103%, FEV1/FVC 0.88, FEF 25-75 percent 3.27/122% Office Spirometry 07/06/17-WNL-FVC 2.25/91%, FEV1 2.04/104%, ratio 0.91, FEF 25-75% 3.62/173% FENO 10/09/17   11   Not elevated  PFT 01/15/18-WNL, FVC 2.30/93%, FEV1 2.13/109%, ratio 0.93, TLC 133%, DLCO 104%, no response to dilator HST 10/27/2017-AHI 7.3/hour, desaturation to 88%, body weight 193 pounds ---------------------------------------------------------------------------------- 10/09/2017- 58 year old female never smoker followed for Allergic rhinitis, Asthma, complicated by GERD  ----84yr f/u for asthma. States breathing has been ok since last visit.  Ventolin HFA, Singulair, DuoNeb, Breo 100 ----FOLLOWS FOR: c/o worsening dyspnea, chest tightness.  Coughs when lying supine but does not recognize reflux or dysphagia.  Little sputum.  Feels tighter/more short of breath going in and out of doors "change of air".  Using rescue inhaler occasionally.  Wakes  with anterior chest wall soreness and is aware that she snores.  Sleep study was negative many years ago. Trelegy seem to have no effect.Breo 200 is about the same as Breo 100. FENO 10/09/17   11   Not elevated CT chest 07/07/2017 Negative for pulmonary embolism or other specific cause of symptoms.  01/15/2018-57 year old female never smoker followed for Allergic rhinitis, Asthma, complicated by GERD -----DOE and Asthma: Review PFT from today.  Ventolin HFA, DuoNeb, Advair 250 or Breo 100, Humidity bothers her but she is not sick and feels stable. HST 10/27/2017-AHI 7.3/hour, desaturation to 88%, body weight 193 pounds She does not want CPAP for minimal OSA.  We discussed this level of severity and treatment options which can include observation, weight loss,  positional sleep, chinstrap or oral appliance.  She will consider those approaches. PFT 01/15/18-WNL, FVC 2.30/93%, FEV1 2.13/109%, ratio 0.93, TLC 133%, DLCO 104%, no response to dilator  Review of Systems-Per HPI.   + = pos Constitutional:   No-   weight loss, night sweats, fevers, chills, fatigue, lassitude. HEENT:   +  headaches, difficulty swallowing, tooth/dental problems, sore throat,       No-sneezing, itching, no-ear ache, + nasal congestion, no- post nasal drip,  CV:  + chest pain, orthopnea, PND, swelling in lower extremities, anasarca, dizziness, palpitations Resp: No-   shortness of breath with exertion or at rest.             productive cough,  No- non-productive cough,  No- coughing up of blood.              No-   change in color of mucus. No- wheezing.   Skin:  GI:  No-   heartburn, indigestion, abdominal pain, nausea, vomiting,  GU: . MS:  No-   joint pain or swelling.  . Neuro-     nothing unusual Psych:  No- change in mood or affect. No depression or anxiety.  No memory loss.  Objective:   Physical Exam General- Alert, Oriented, Affect-appropriate, Distress- none acute; + Obese Skin- rash-none, lesions- none, excoriation- none Lymphadenopathy- none Head- atraumatic            Eyes- Gross vision intact, PERRLA, conjunctivae clear, no discolored secretions            Ears- Hearing, canals-normal            Nose-+ Sniffing, no-Septal dev, mucus, polyps, erosion, perforation             Throat-  Mallampati III , mucosa clear , drainage- none, tonsils- atrophic Neck- flexible , trachea midline, no stridor , thyroid nl, carotid no bruit Chest - symmetrical excursion , unlabored           Heart/CV- RRR , no murmur , no gallop  , no rub, nl s1 s2                           - JVD- none , edema- none, stasis changes- none, varices- none           Lung-+ clear after albuterol with PFT, unlabored, wheeze- none, cough                         -none , dullness-none, rub- none,             Chest wall-  Abd-  Br/ Gen/ Rectal- Not done, not indicated Extrem- cyanosis- none, clubbing, none, atrophy- none, strength- nl Neuro- grossly intact to observation

## 2018-01-15 NOTE — Assessment & Plan Note (Signed)
Minimal obstructive sleep apnea corresponds to her history of snoring.  Otherwise she is not symptomatic.  She does not want CPAP.  We discussed oral appliances and conservative measures.  Medical value to her of treating minimal OSA would be marginal. Plan-recommended weight loss and positional sleep off back.  Other measures reviewed.

## 2018-01-15 NOTE — Assessment & Plan Note (Signed)
Notes moderate uncomplicated intermittent asthma.  All flow evaluations have been normal but she reports symptomatic response to bronchodilators. Plan-methacholine inhalation challenge

## 2018-01-17 MED FILL — traMADol HCL 50 MG TABS: 50 | 7 days supply | Qty: 60 | Fill #0

## 2018-01-25 ENCOUNTER — Ambulatory Visit (HOSPITAL_COMMUNITY)
Admission: RE | Admit: 2018-01-25 | Discharge: 2018-01-25 | Disposition: A | Discharge status: Home / Self Care | Payer: 59 | Source: Ambulatory Visit | Attending: Internal Medicine | Admitting: Internal Medicine

## 2018-01-25 DIAGNOSIS — J4520 Mild intermittent asthma, uncomplicated: Secondary | ICD-10-CM | POA: Insufficient documentation

## 2018-01-25 LAB — PULMONARY FUNCTION TEST
FEF 25-75 POST: 2.28 L/s
FEF 25-75 Pre: 2.65 L/sec
FEF2575-%CHANGE-POST: -14 %
FEF2575-%PRED-PRE: 129 %
FEF2575-%Pred-Post: 111 %
FEV1-%Change-Post: -3 %
FEV1-%Pred-Post: 102 %
FEV1-%Pred-Pre: 105 %
FEV1-Post: 1.99 L
FEV1-Pre: 2.05 L
FEV1FVC-%CHANGE-POST: 0 %
FEV1FVC-%Pred-Pre: 104 %
FEV6-%Change-Post: -3 %
FEV6-%Pred-Post: 99 %
FEV6-%Pred-Pre: 103 %
FEV6-Post: 2.35 L
FEV6-Pre: 2.44 L
FEV6FVC-%Change-Post: 0 %
FEV6FVC-%Pred-Post: 103 %
FEV6FVC-%Pred-Pre: 104 %
FVC-%Change-Post: -3 %
FVC-%PRED-PRE: 99 %
FVC-%Pred-Post: 96 %
FVC-POST: 2.36 L
FVC-PRE: 2.44 L
POST FEV6/FVC RATIO: 99 %
Post FEV1/FVC ratio: 84 %
Pre FEV1/FVC ratio: 84 %
Pre FEV6/FVC Ratio: 100 %

## 2018-01-25 MED ORDER — METHACHOLINE 4 MG/ML NEB SOLN
2.0000 mL | Freq: Once | RESPIRATORY_TRACT | Status: AC
  Administered 2018-01-25: 8 mg via RESPIRATORY_TRACT

## 2018-01-25 MED ORDER — METHACHOLINE 16 MG/ML NEB SOLN: 2.0000 mL | Freq: Once | RESPIRATORY_TRACT | Status: DC

## 2018-01-25 MED ORDER — SODIUM CHLORIDE 0.9 % IN NEBU
3.0000 mL | INHALATION_SOLUTION | Freq: Once | RESPIRATORY_TRACT | Status: AC
  Administered 2018-01-25: 3 mL via RESPIRATORY_TRACT

## 2018-01-25 MED ORDER — METHACHOLINE 0.0625 MG/ML NEB SOLN
2.0000 mL | Freq: Once | RESPIRATORY_TRACT | Status: AC
  Administered 2018-01-25: 0.125 mg via RESPIRATORY_TRACT

## 2018-01-25 MED ORDER — METHACHOLINE 1 MG/ML NEB SOLN
2.0000 mL | Freq: Once | RESPIRATORY_TRACT | Status: AC
  Administered 2018-01-25: 2 mg via RESPIRATORY_TRACT

## 2018-01-25 MED ORDER — METHACHOLINE 0.25 MG/ML NEB SOLN
2.0000 mL | Freq: Once | RESPIRATORY_TRACT | Status: AC
  Administered 2018-01-25: 0.5 mg via RESPIRATORY_TRACT

## 2018-01-25 MED ORDER — ALBUTEROL SULFATE (2.5 MG/3ML) 0.083% IN NEBU
2.5000 mg | INHALATION_SOLUTION | Freq: Once | RESPIRATORY_TRACT | Status: AC
  Administered 2018-01-25: 2.5 mg via RESPIRATORY_TRACT

## 2018-01-31 MED FILL — ACETAMINOPHEN/COD #3 TABLET: 300-30 | 4 days supply | Qty: 14 | Fill #0

## 2018-02-01 ENCOUNTER — Encounter: Payer: Self-pay | Admitting: Internal Medicine

## 2018-02-02 ENCOUNTER — Encounter: Payer: Self-pay | Admitting: Internal Medicine

## 2018-02-02 NOTE — Telephone Encounter (Signed)
Please advise of meth challenge test results thanks

## 2018-02-02 NOTE — Telephone Encounter (Signed)
The methacholine test had to be stopped a little early because of hard coughing, but it was trending positive and I have interpreted it as consistent with a diagnosis of asthma. We can continue with current treatment and she can let us know if she is having problems.

## 2018-02-27 MED FILL — LOSARTAN-HCTZ 100-25 MG TAB: 100-25 | 90 days supply | Qty: 90 | Fill #2

## 2018-03-06 ENCOUNTER — Encounter: Payer: Self-pay | Admitting: Family Medicine

## 2018-03-07 MED FILL — NABUMETONE 750 MG TABLET: 750 | 30 days supply | Qty: 60 | Fill #0

## 2018-03-07 MED FILL — traMADol HCL 50 MG TABS: 50 | 7 days supply | Qty: 60 | Fill #0

## 2018-03-09 MED FILL — AMOXICILLIN 500 MG CAPSULE: 500 | 3 days supply | Qty: 16 | Fill #0

## 2018-03-26 MED FILL — ADVAIR 250/50 DISKUS: 250-50 | 60 days supply | Qty: 60 | Fill #2

## 2018-04-09 MED FILL — MONTELUKAST SOD 10 MG TAB: 10 | 90 days supply | Qty: 90 | Fill #3

## 2018-04-09 MED FILL — NABUMETONE 750 MG TABLET: 750 | 30 days supply | Qty: 60 | Fill #1

## 2018-04-10 MED FILL — traMADol HCL 50 MG TABS: 50 | 8 days supply | Qty: 60 | Fill #0

## 2018-05-08 DIAGNOSIS — M25562 Pain in left knee: Secondary | ICD-10-CM | POA: Diagnosis not present

## 2018-05-08 DIAGNOSIS — M67912 Unspecified disorder of synovium and tendon, left shoulder: Secondary | ICD-10-CM | POA: Diagnosis not present

## 2018-05-08 MED FILL — traMADol HCL 50 MG TABS: 50 | 7 days supply | Qty: 60 | Fill #0

## 2018-05-08 MED FILL — MELOXICAM 15 MG TABLET: 15 | 30 days supply | Qty: 30 | Fill #0

## 2018-05-18 DIAGNOSIS — Z6838 Body mass index (BMI) 38.0-38.9, adult: Secondary | ICD-10-CM | POA: Diagnosis not present

## 2018-05-18 DIAGNOSIS — J45909 Unspecified asthma, uncomplicated: Secondary | ICD-10-CM | POA: Diagnosis not present

## 2018-05-18 DIAGNOSIS — Z01419 Encounter for gynecological examination (general) (routine) without abnormal findings: Secondary | ICD-10-CM | POA: Diagnosis not present

## 2018-05-22 ENCOUNTER — Encounter: Payer: Self-pay | Admitting: Internal Medicine

## 2018-05-22 ENCOUNTER — Ambulatory Visit: Payer: 59 | Admitting: Internal Medicine

## 2018-05-22 DIAGNOSIS — J4520 Mild intermittent asthma, uncomplicated: Secondary | ICD-10-CM

## 2018-05-22 DIAGNOSIS — IMO0001 Reserved for inherently not codable concepts without codable children: Secondary | ICD-10-CM

## 2018-05-22 DIAGNOSIS — G4733 Obstructive sleep apnea (adult) (pediatric): Secondary | ICD-10-CM | POA: Diagnosis not present

## 2018-05-22 MED FILL — LOSARTAN-HCTZ 100-25 MG TAB: 100-25 | 30 days supply | Qty: 30 | Fill #3

## 2018-05-22 MED FILL — ADVAIR 250/50 DISKUS: 250-50 | 60 days supply | Qty: 60 | Fill #3

## 2018-05-22 NOTE — Assessment & Plan Note (Signed)
She prefers Advair and is compliant.  Occasional mild exacerbation from irritant triggers and temperature changes but doing well currently. Plan-continue current meds.

## 2018-05-22 NOTE — Patient Instructions (Signed)
I took [employer], since you prefer Advair.  Consider wearing a scarf over your nose and mouth in cold air.  Consider trying otc nasal saline gel- NeilMed and many others. A little in each nostril as often as needed for dryness.

## 2018-05-22 NOTE — Progress Notes (Signed)
Patient ID: Katherine Tanner, female    DOB: March 06, 1961, 57 y.o.   MRN: [id number]  HPI female never smoker followed for Allergic rhinitis, Asthma,OSA, complicated by GERD Office spirometry: 11/24/2014: WNL FVC 2.39/97%, FEV1 2.09/103%, FEV1/FVC 0.88, FEF 25-75 percent 3.27/122% Office Spirometry 07/06/17-WNL-FVC 2.25/91%, FEV1 2.04/104%, ratio 0.91, FEF 25-75% 3.62/173% FENO 10/09/17   11   Not elevated Methacholine inhalation challenge test 01/25/2018-interpreted as positive because of initiation of hard coughing although FEV1 it only declined by 15%. HST 10/27/2017-AHI 7.3/hour, desaturation to 88%, body weight 193 pounds ----------------------------------------------------------------------------------  10/09/2017- 57 year old female never smoker followed for Allergic rhinitis, Asthma, complicated by GERD  ----86yr f/u for asthma. States breathing has been ok since last visit.  Ventolin HFA, Singulair, DuoNeb, Breo 100 ----FOLLOWS FOR: c/o worsening dyspnea, chest tightness.  Coughs when lying supine but does not recognize reflux or dysphagia.  Little sputum.  Feels tighter/more short of breath going in and out of doors "change of air".  Using rescue inhaler occasionally.  Wakes  with anterior chest wall soreness and is aware that she snores.  Sleep study was negative many years ago. Trelegy seem to have no effect.Breo 200 is about the same as Breo 100. FENO 10/09/17   11   Not elevated CT chest 07/07/2017 Negative for pulmonary embolism or other specific cause of symptoms.  05/22/2018- 57 year old female never smoker followed for Allergic rhinitis, Asthma, OSA, complicated by GERD HST 11/11/2583-IDP 7.3/hour, desaturation to 88%, body weight 193 pounds  ----80yr f/u for asthma. States breathing has been ok since last visit.  Ventolin HFA, Singulair, DuoNeb, Breo 100 -----Asthma: Pt has good and bad days-states past Saturday her O2 levels went to 88% RA-usually happens in cold weather; uses  inhalers and feels this helps.  Ventolin HFA, Singulair, neb DuoNeb, Advair 250,  Walk test today 05/22/2018-minimum saturation 99%, maximum heart rate 108.  Her triggers are cold air, smoke and colognes/irritants.  She declines flu shot, claiming allergy. Methacholine inhalation challenge test 01/25/2018-interpreted as positive because of initiation of hard coughing although FEV1 it only declined by 15%. She prefers Advair over [place]. Minimal sleep apnea to be managed conservatively with weight loss and sleep position off back.  Review of Systems-Per HPI.   + = positive Constitutional:   No-   weight loss, night sweats, fevers, chills, fatigue, lassitude. HEENT:   +  headaches, difficulty swallowing, tooth/dental problems, sore throat,       No-sneezing, itching, no-ear ache, + nasal congestion, no- post nasal drip,  CV:  + chest pain, orthopnea, PND, swelling in lower extremities, anasarca, dizziness, palpitations Resp: No-   shortness of breath with exertion or at rest.             productive cough,  No- non-productive cough,  No- coughing up of blood.              No-   change in color of mucus. No- wheezing.   Skin:  GI:  No-   heartburn, indigestion, abdominal pain, nausea, vomiting,  GU: . MS:  No-   joint pain or swelling.  . Neuro-     nothing unusual Psych:  No- change in mood or affect. No depression or anxiety.  No memory loss.  Objective:   Physical Exam General- Alert, Oriented, Affect-appropriate, Distress- none acute; + Obese Skin- rash-none, lesions- none, excoriation- none Lymphadenopathy- none Head- atraumatic            Eyes- Gross vision intact, PERRLA, conjunctivae clear, no  discolored secretions            Ears- Hearing, canals-normal            Nose-+ Sniffing, no-Septal dev, mucus, polyps, erosion, perforation             Throat- Mallampati III , mucosa clear , drainage- none, tonsils- atrophic Neck- flexible , trachea midline, no stridor , thyroid nl, carotid  no bruit Chest - symmetrical excursion , unlabored           Heart/CV- RRR , no murmur , no gallop  , no rub, nl s1 s2                           - JVD- none , edema- none, stasis changes- none, varices- none           Lung-+ clear , unlabored, wheeze- none, cough -none , dullness-none, rub- none,            Chest wall-  Abd-  Br/ Gen/ Rectal- Not done, not indicated Extrem- cyanosis- none, clubbing, none, atrophy- none, strength- nl Neuro- grossly intact to observation

## 2018-05-22 NOTE — Assessment & Plan Note (Signed)
Following conservatively with encouraged weight loss and sleep off back position.

## 2018-06-12 MED FILL — MELOXICAM 15 MG TABLET: 15 | 30 days supply | Qty: 30 | Fill #1

## 2018-06-14 ENCOUNTER — Telehealth: Payer: Self-pay | Admitting: Internal Medicine

## 2018-06-14 MED FILL — traMADol HCL 50 MG TABS: 50 | 8 days supply | Qty: 60 | Fill #0

## 2018-06-14 NOTE — Telephone Encounter (Signed)
Forms signed by CY and returned to [place].

## 2018-06-15 NOTE — Telephone Encounter (Signed)
Rec'd completed forms - sent to [employer] via interoffice mail -pr

## 2018-06-25 MED FILL — LOSARTAN-HCTZ 100-25 MG TAB: 100-25 | 30 days supply | Qty: 30 | Fill #4

## 2018-07-02 ENCOUNTER — Ambulatory Visit (INDEPENDENT_AMBULATORY_CARE_PROVIDER_SITE_OTHER): Payer: 59 | Admitting: Family Medicine

## 2018-07-02 ENCOUNTER — Encounter: Payer: Self-pay | Admitting: Family Medicine

## 2018-07-02 VITALS — BP 128/70 | HR 77 | Temp 98.3°F | Ht 60.75 in | Wt 200.2 lb

## 2018-07-02 DIAGNOSIS — Z0000 Encounter for general adult medical examination without abnormal findings: Secondary | ICD-10-CM

## 2018-07-02 DIAGNOSIS — E7800 Pure hypercholesterolemia, unspecified: Secondary | ICD-10-CM | POA: Diagnosis not present

## 2018-07-02 DIAGNOSIS — R748 Abnormal levels of other serum enzymes: Secondary | ICD-10-CM

## 2018-07-02 DIAGNOSIS — R7303 Prediabetes: Secondary | ICD-10-CM

## 2018-07-02 DIAGNOSIS — I10 Essential (primary) hypertension: Secondary | ICD-10-CM

## 2018-07-02 DIAGNOSIS — E66812 Obesity, class 2: Secondary | ICD-10-CM

## 2018-07-02 MED ORDER — LOSARTAN POTASSIUM-HCTZ 100-25 MG PO TABS: 1.0000 | ORAL_TABLET | Freq: Every day | ORAL | 3 refills | Status: DC

## 2018-07-02 NOTE — Assessment & Plan Note (Signed)
bp in fair control at this time  BP Readings from Last 1 Encounters:  07/02/18 128/70   No changes needed Most recent labs reviewed  Disc lifstyle change with low sodium diet and exercise   Refilled losartan-hct

## 2018-07-02 NOTE — Progress Notes (Signed)
Subjective:    Patient ID: Katherine Tanner, female    DOB: [birth year], 57 y.o.   MRN: [id number]  HPI Here for health maintenance exam and to review chronic medical problems    Wt Readings from Last 3 Encounters:  07/02/18 200 lb 4 oz (90.8 kg)  05/22/18 198 lb (89.8 kg)  01/15/18 197 lb (89.4 kg)  trying to loose weight  She walks all day for work  She goes to her gym and rides the exercise bike (has had knee replacements and now tendonitis) Cannot find water exercise she can get to  She is eating healthy most of the time  38.15 kg/m   Mammogram 5/19 Self breast exam - no lumps (had exam last mo_   Pap 11/17 neg with neg HPV (gyn) Had gyn f/u in November - then she got a period  If very close to menopause  Not a lot of hot flashes    Tetanus shot 6/13  Colonoscopy 12/14 per pt - had polyps   Zoster status -has had shingles    Cannot take flu shot -is allergic   bp is stable today  No cp or palpitations or headaches or edema  No side effects to medicines  BP Readings from Last 3 Encounters:  07/02/18 128/70  05/22/18 104/70  01/15/18 114/76     Sees Dr Annamaria Boots for asthma and OSA  Had to use NMT yesterday-had a bad day Teryl Lucy and dry cough  Better today  Took mucinex    Hx of chronic elevated alk phos  Will do labs today   H/o prediabetes Lab Results  Component Value Date   HGBA1C 6.1 06/08/2017   Due for labs   Also hyperlipidemia Lab Results  Component Value Date   CHOL 191 06/08/2017   HDL 61.60 06/08/2017   LDLCALC 116 (H) 06/08/2017   LDLDIRECT 147.9 12/17/2012   TRIG 69.0 06/08/2017   CHOLHDL 3 06/08/2017    Patient Active Problem List   Diagnosis Date Noted  . Obstructive sleep apnea 10/10/2017  . Primary osteoarthritis of left knee 12/30/2016  . Obesity 05/24/2016  . Prediabetes 02/24/2014  . Osteoarthritis of right knee 11/15/2013  . Osteoarthritis of left knee 11/15/2013  . Alkaline phosphatase elevation 01/02/2012  .  Hyperlipidemia 01/02/2012  . Routine general medical examination at a health care facility 12/09/2011  . 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 POLYARTHRITIS 01/28/2010  . ANGIOEDEMA 03/27/2007  . Essential hypertension 03/19/2007  . Perennial allergic rhinitis with seasonal variation 03/19/2007  . Moderate intermittent asthma 03/19/2007  . GERD 03/19/2007  . HIATAL HERNIA 03/19/2007  . GESTATIONAL DIABETES 03/19/2007   Past Medical History:  Diagnosis Date  . Allergy    takes Claritin daily and Flonase daily  . Arthritis   . Asthma    uses Albuterol daily as needed;Singulair nightly;DUlera daily  . GERD (gastroesophageal reflux disease)    occasionally will take OTC meds but states she just watches what she eats  . H/O hiatal hernia   . History of bronchitis    last time 3yr ago  . History of colon polyps   . History of kidney stones   . History of shingles   . Hypertension    takes Hyzaar daily  . Joint pain   . Joint swelling   . Pneumonia    hx of;last time at least 263yrago  . PONV (postoperative nausea and vomiting)    Past Surgical History:  Procedure Laterality Date  .  BREAST EXCISIONAL BIOPSY Right   . CARDIAC CATHETERIZATION    . COLONOSCOPY    . ESOPHAGOGASTRODUODENOSCOPY    . KNEE SURGERY Right    arthroscopy  . Right ganglion cyst     x 2  . right lumpectomy  around 1983  . STERIOD INJECTION Left 11/15/2013   Procedure: Marcaine/STEROID INJECTION;  Surgeon: Alta Corning, MD;  Location: [place];  Service: Orthopedics;  Laterality: Left;  . TOTAL KNEE ARTHROPLASTY Right 11/15/2013   Procedure: RIGHT TOTAL KNEE ARTHROPLASTY;  Surgeon: Alta Corning, MD;  Location: [place];  Service: Orthopedics;  Laterality: Right;  . TOTAL KNEE ARTHROPLASTY Left 12/30/2016   Procedure: TOTAL KNEE ARTHROPLASTY;  Surgeon: Dorna Leitz, MD;  Location: [place];  Service: Orthopedics;  Laterality: Left;  . TUBAL LIGATION     Social History   Tobacco Use  . Smoking status:  Never Smoker  . Smokeless tobacco: Never Used  Substance Use Topics  . Alcohol use: No    Alcohol/week: 0.0 standard drinks  . Drug use: No   Family History  Problem Relation Age of Onset  . Diabetes Father   . Sarcoidosis Mother   . Glaucoma Mother   . Hypertension Mother   . Breast cancer Neg Hx    Allergies  Allergen Reactions  . Ace Inhibitors Shortness Of Breath and Cough    Cough/ breathing problems   . Amoxicillin-Pot Clavulanate Swelling    SWELLING REACTION UNSPECIFIED   . Influenza Virus Vacc Split Pf     UNSPECIFIED REACTION   . Shellfish Allergy Itching   Current Outpatient Medications on File Prior to Visit  Medication Sig Dispense Refill  . Calcium Citrate-Vitamin D (CALCIUM + D PO) Take 1 tablet by mouth daily.    [name] EPINEPHRINE 0.3 mg/0.3 mL IJ SOAJ injection INJECT 1 SYRINGE INTO THE MUSCLE ONCE 1 Device 12  . fluticasone (FLONASE) 50 MCG/ACT nasal spray Place 2 sprays into both nostrils daily. 48 g 3  . Fluticasone-Salmeterol (ADVAIR) 250-50 MCG/DOSE AEPB Inhale 1 puff, then rinse mouth, once daily 60 each 11  . guaiFENesin (MUCINEX) 600 MG 12 hr tablet Take 600 mg by mouth 2 (two) times daily as needed.     [name] ipratropium-albuterol (DUONEB) 0.5-2.5 (3) MG/3ML SOLN USE 1 VIAL VIA NEBULIZER EVERY 6 HOURS AS NEEDED 90 mL 12  . loratadine (CLARITIN) 10 MG tablet Take 10 mg by mouth daily.     . meloxicam (MOBIC) 15 MG tablet   2  . montelukast (SINGULAIR) 10 MG tablet TAKE 1 TABLET BY MOUTH AT BEDTIME. 90 tablet 3  . Multiple Vitamin (MULTIVITAMIN PO) Take 1 tablet by mouth daily.     . Nebulizers (COMPRESSOR/NEBULIZER) MISC Use as directed 1 each 0  . traMADol (ULTRAM) 50 MG tablet Take 50 mg by mouth every 6 (six) hours as needed (1 or 2).    . VENTOLIN HFA 108 (90 Base) MCG/ACT inhaler INHALE 2 PUFFS INTO THE LUNGS EVERY 6 HOURS AS NEEDED FOR WHEEZING OR SHORTNESS OF BREATH. 18 g 12   No current facility-administered medications on file prior to visit.       Review of Systems  Constitutional: Negative for activity change, appetite change, fatigue, fever and unexpected weight change.  HENT: Negative for congestion, ear pain, rhinorrhea, sinus pressure and sore throat.   Eyes: Negative for pain, redness and visual disturbance.  Respiratory: Negative for cough, shortness of breath and wheezing.   Cardiovascular: Negative for chest pain and palpitations.  Gastrointestinal: Negative for abdominal pain, blood in stool, constipation and diarrhea.  Endocrine: Negative for polydipsia and polyuria.  Genitourinary: Negative for dysuria, frequency and urgency.  Musculoskeletal: Positive for arthralgias. Negative for back pain and myalgias.  Skin: Negative for pallor and rash.  Allergic/Immunologic: Negative for environmental allergies.  Neurological: Negative for dizziness, syncope and headaches.  Hematological: Negative for adenopathy. Does not bruise/bleed easily.  Psychiatric/Behavioral: Negative for decreased concentration and dysphoric mood. The patient is not nervous/anxious.        Objective:   Physical Exam Constitutional:      General: She is not in acute distress.    Appearance: Normal appearance. She is well-developed. She is obese.  HENT:     Head: Normocephalic and atraumatic.     Right Ear: Tympanic membrane, ear canal and external ear normal.     Left Ear: Tympanic membrane, ear canal and external ear normal.     Nose: Nose normal. No congestion or rhinorrhea.     Mouth/Throat:     Mouth: Mucous membranes are moist.  Eyes:     General: No scleral icterus.       Right eye: No discharge.        Left eye: No discharge.     Conjunctiva/sclera: Conjunctivae normal.     Pupils: Pupils are equal, round, and reactive to light.  Neck:     Musculoskeletal: Normal range of motion and neck supple.     Thyroid: No thyromegaly.     Vascular: No carotid bruit or JVD.  Cardiovascular:     Rate and Rhythm: Normal rate and regular rhythm.      Heart sounds: Normal heart sounds. No gallop.   Pulmonary:     Effort: Pulmonary effort is normal. No respiratory distress.     Breath sounds: Normal breath sounds. No wheezing or rales.  Abdominal:     General: Bowel sounds are normal. There is no distension.     Palpations: Abdomen is soft. There is no mass.     Tenderness: There is no abdominal tenderness.  Musculoskeletal:        General: No tenderness.     Right lower leg: No edema.     Left lower leg: No edema.     Comments: Limited rom knees  Lymphadenopathy:     Cervical: No cervical adenopathy.  Skin:    General: Skin is warm and dry.     Coloration: Skin is not pale.     Findings: No erythema or rash.  Neurological:     General: No focal deficit present.     Mental Status: She is alert.     Cranial Nerves: No cranial nerve deficit.     Motor: No abnormal muscle tone.     Coordination: Coordination normal.     Deep Tendon Reflexes: Reflexes are normal and symmetric. Reflexes normal.  Psychiatric:        Mood and Affect: Mood normal.           Assessment & Plan:   Problem List Items Addressed This Visit      Cardiovascular and Mediastinum   Essential hypertension    bp in fair control at this time  BP Readings from Last 1 Encounters:  07/02/18 128/70   No changes needed Most recent labs reviewed  Disc lifstyle change with low sodium diet and exercise   Refilled losartan-hct       Relevant Medications   losartan-hydrochlorothiazide (HYZAAR) 100-25 MG tablet   Other Relevant  Orders   CBC with Differential/Platelet   Comprehensive metabolic panel   Lipid panel   TSH     Other   Routine general medical examination at a health care facility - Primary    Reviewed health habits including diet and exercise and skin cancer prevention Reviewed appropriate screening tests for age  Also reviewed health mt list, fam hx and immunization status , as well as social and family history   Sent for last  colonoscopy result-? When due  utd gyn care with gyn (close to menopause)  Wellness labs today  Commended good attitude and persistence re: healthy habits in setting of joint problems       Prediabetes    A1C today  disc imp of low glycemic diet and wt loss to prevent DM2       Obesity    Discussed how this problem influences overall health and the risks it imposes  Reviewed plan for weight loss with lower calorie diet (via better food choices and also portion control or program like weight watchers) and exercise building up to or more than 30 minutes 5 days per week including some aerobic activity   She is counting steps Enc her to continue using bike Also looking into water exercise that would fit her schedule       Hyperlipidemia    Disc goals for lipids and reasons to control them Rev last labs with pt Rev low sat fat diet in detail Labs today        Relevant Medications   losartan-hydrochlorothiazide (HYZAAR) 100-25 MG tablet   Other Relevant Orders   Lipid panel   Alkaline phosphatase elevation    Labs today

## 2018-07-02 NOTE — Assessment & Plan Note (Signed)
Disc goals for lipids and reasons to control them Rev last labs with pt Rev low sat fat diet in detail Labs today

## 2018-07-02 NOTE — Assessment & Plan Note (Signed)
Labs today

## 2018-07-02 NOTE — Patient Instructions (Addendum)
For weight loss  Try to get most of your carbohydrates from produce (with the exception of white potatoes)  Eat less bread/pasta/rice/snack foods/cereals/sweets and other items from the middle of the grocery store (processed carbs)  For cholesterol  Avoid red meat/ fried foods/ egg yolks/ fatty breakfast meats/ butter, cheese and high fat dairy/ and shellfish    If you are interested in the new shingles vaccine (Shingrix) - call your local pharmacy to check on coverage and availability  If affordable, get on a wait list at your pharmacy to get the vaccine. We can put you on a wait list.  Given your flu shot allergy -talk to Dr Annamaria Boots about

## 2018-07-02 NOTE — Assessment & Plan Note (Signed)
A1C today  disc imp of low glycemic diet and wt loss to prevent DM2  

## 2018-07-02 NOTE — Assessment & Plan Note (Signed)
Discussed how this problem influences overall health and the risks it imposes  Reviewed plan for weight loss with lower calorie diet (via better food choices and also portion control or program like weight watchers) and exercise building up to or more than 30 minutes 5 days per week including some aerobic activity   She is counting steps Enc her to continue using bike Also looking into water exercise that would fit her schedule

## 2018-07-02 NOTE — Assessment & Plan Note (Signed)
Reviewed health habits including diet and exercise and skin cancer prevention Reviewed appropriate screening tests for age  Also reviewed health mt list, fam hx and immunization status , as well as social and family history   Sent for last colonoscopy result-? When due  utd gyn care with gyn (close to menopause)  Wellness labs today  Commended good attitude and persistence re: healthy habits in setting of joint problems

## 2018-07-03 LAB — COMPREHENSIVE METABOLIC PANEL
ALT: 29 U/L (ref 0–35)
AST: 22 U/L (ref 0–37)
Albumin: 4.6 g/dL (ref 3.5–5.2)
Alkaline Phosphatase: 119 U/L — ABNORMAL HIGH (ref 39–117)
BUN: 17 mg/dL (ref 6–23)
CO2: 30 mEq/L (ref 19–32)
Calcium: 10.4 mg/dL (ref 8.4–10.5)
Chloride: 100 mEq/L (ref 96–112)
Creatinine, Ser: 0.72 mg/dL (ref 0.40–1.20)
GFR: 107.82 mL/min (ref >60.00)
Glucose, Bld: 80 mg/dL (ref 70–99)
Potassium: 3.7 mEq/L (ref 3.5–5.1)
Sodium: 140 mEq/L (ref 135–145)
Total Bilirubin: 0.4 mg/dL (ref 0.2–1.2)
Total Protein: 7.8 g/dL (ref 6.0–8.3)

## 2018-07-03 LAB — LIPID PANEL
CHOLESTEROL: 209 mg/dL — AB (ref 0–200)
HDL: 61.4 mg/dL (ref >39.00)
LDL CALC: 128 mg/dL — AB (ref 0–99)
NonHDL: 147.27
Total CHOL/HDL Ratio: 3
Triglycerides: 97 mg/dL (ref 0.0–149.0)
VLDL: 19.4 mg/dL (ref 0.0–40.0)

## 2018-07-03 LAB — CBC WITH DIFFERENTIAL/PLATELET
Basophils Absolute: 0.1 10*3/uL (ref 0.0–0.1)
Basophils Relative: 1.2 % (ref 0.0–3.0)
Eosinophils Absolute: 0.2 10*3/uL (ref 0.0–0.7)
Eosinophils Relative: 1.7 % (ref 0.0–5.0)
HCT: 41.8 % (ref 36.0–46.0)
Hemoglobin: 13.7 g/dL (ref 12.0–15.0)
Lymphocytes Relative: 27.5 % (ref 12.0–46.0)
Lymphs Abs: 2.7 10*3/uL (ref 0.7–4.0)
MCHC: 32.8 g/dL (ref 30.0–36.0)
MCV: 87.9 fl (ref 78.0–100.0)
Monocytes Absolute: 1 10*3/uL (ref 0.1–1.0)
Monocytes Relative: 9.9 % (ref 3.0–12.0)
Neutro Abs: 5.9 10*3/uL (ref 1.4–7.7)
Neutrophils Relative %: 59.7 % (ref 43.0–77.0)
Platelets: 125 10*3/uL — ABNORMAL LOW (ref 150.0–400.0)
RBC: 4.76 Mil/uL (ref 3.87–5.11)
RDW: 14 % (ref 11.5–15.5)
WBC: 9.8 10*3/uL (ref 4.0–10.5)

## 2018-07-03 LAB — TSH: TSH: 2.36 u[IU]/mL (ref 0.35–4.50)

## 2018-07-11 MED FILL — MELOXICAM 15 MG TABLET: 15 | 30 days supply | Qty: 30 | Fill #2

## 2018-07-12 ENCOUNTER — Other Ambulatory Visit: Payer: Self-pay | Admitting: Internal Medicine

## 2018-07-12 MED FILL — traMADol HCL 50 MG TABS: 50 | 8 days supply | Qty: 60 | Fill #0

## 2018-07-12 MED FILL — MONTELUKAST SOD 10 MG TAB: 10 | 90 days supply | Qty: 90 | Fill #0

## 2018-07-23 NOTE — Telephone Encounter (Signed)
Patient sent message requesting mouth guard recommendations for her mild sleep apnea.  She stated that she does not want to sleep in a mask.  Dr. Annamaria Boots, please advise

## 2018-07-23 NOTE — Telephone Encounter (Signed)
Drug stores have mouth pieces for snoring (different from ones to prevent tooth grinding). These are usually near where tooth brushes are.  More effective if needed, are custom- made, fitted mouth pieces. These are made by some dentists and related practioners. If interested, we can refer to Dr Oneal Grout, orthodontist. He is very good with these,and making an appointment is an opportunity to learn about the devices, but doesn't commit you to get one unless you decide you want it.

## 2018-07-26 MED FILL — ADVAIR 250/50 DISKUS: 250-50 | 60 days supply | Qty: 60 | Fill #4

## 2018-07-26 MED FILL — LOSARTAN-HCTZ 100-25 MG TAB: 100-25 | 30 days supply | Qty: 30 | Fill #0

## 2018-07-30 DIAGNOSIS — H52223 Regular astigmatism, bilateral: Secondary | ICD-10-CM | POA: Diagnosis not present

## 2018-07-30 DIAGNOSIS — H52222 Regular astigmatism, left eye: Secondary | ICD-10-CM | POA: Diagnosis not present

## 2018-07-30 DIAGNOSIS — H524 Presbyopia: Secondary | ICD-10-CM | POA: Diagnosis not present

## 2018-07-30 DIAGNOSIS — H04123 Dry eye syndrome of bilateral lacrimal glands: Secondary | ICD-10-CM | POA: Diagnosis not present

## 2018-07-30 DIAGNOSIS — H5203 Hypermetropia, bilateral: Secondary | ICD-10-CM | POA: Diagnosis not present

## 2018-08-09 MED FILL — NABUMETONE 750 MG TABS: 750 | 30 days supply | Qty: 60 | Fill #2

## 2018-08-14 ENCOUNTER — Ambulatory Visit: Payer: 59 | Admitting: Internal Medicine

## 2018-08-14 ENCOUNTER — Encounter: Payer: Self-pay | Admitting: Internal Medicine

## 2018-08-14 VITALS — BP 130/72 | HR 78 | Ht 61.0 in | Wt 200.0 lb

## 2018-08-14 DIAGNOSIS — Z887 Allergy status to serum and vaccine status: Secondary | ICD-10-CM | POA: Diagnosis not present

## 2018-08-14 DIAGNOSIS — J45901 Unspecified asthma with (acute) exacerbation: Secondary | ICD-10-CM

## 2018-08-14 DIAGNOSIS — J4521 Mild intermittent asthma with (acute) exacerbation: Secondary | ICD-10-CM

## 2018-08-14 DIAGNOSIS — IMO0001 Reserved for inherently not codable concepts without codable children: Secondary | ICD-10-CM

## 2018-08-14 MED ORDER — METHYLPREDNISOLONE ACETATE 80 MG/ML IJ SUSP
80.0000 mg | Freq: Once | INTRAMUSCULAR | Status: AC
  Administered 2018-08-14: 80 mg via INTRAMUSCULAR

## 2018-08-14 MED ORDER — BENZONATATE 200 MG PO CAPS: 200.0000 mg | ORAL_CAPSULE | Freq: Three times a day (TID) | ORAL | 1 refills | Status: DC | PRN

## 2018-08-14 MED FILL — BENZONATATE 200 MG CAP: 200 | 10 days supply | Qty: 30 | Fill #0

## 2018-08-14 NOTE — Progress Notes (Signed)
Patient ID: Katherine Tanner, female    DOB: 15-Sep-1960, 58 y.o.   MRN: [id number]  HPI female never smoker followed for Allergic rhinitis, Asthma,OSA, complicated by GERD Office spirometry: 11/24/2014: WNL FVC 2.39/97%, FEV1 2.09/103%, FEV1/FVC 0.88, FEF 25-75 percent 3.27/122% Office Spirometry 07/06/17-WNL-FVC 2.25/91%, FEV1 2.04/104%, ratio 0.91, FEF 25-75% 3.62/173% FENO 10/09/17   11   Not elevated Methacholine inhalation challenge test 01/25/2018-interpreted as positive because of initiation of hard coughing although FEV1 it only declined by 15%. HST 10/27/2017-AHI 7.3/hour, desaturation to 88%, body weight 193 pounds ---------------------------------------------------------------------------------- 05/22/2018- 58 year old female never smoker followed for Allergic rhinitis, Asthma, OSA, complicated by GERD HST 09/30/760-UQJ 7.3/hour, desaturation to 88%, body weight 193 pounds  ----23yr f/u for asthma. States breathing has been ok since last visit.  Ventolin HFA, Singulair, DuoNeb, Breo 100 -----Asthma: Pt has good and bad days-states past Saturday her O2 levels went to 88% RA-usually happens in cold weather; uses inhalers and feels this helps.  Ventolin HFA, Singulair, neb DuoNeb, Advair 250,  Walk test today 05/22/2018-minimum saturation 99%, maximum heart rate 108.  Her triggers are cold air, smoke and colognes/irritants.  She declines flu shot, claiming allergy. Methacholine inhalation challenge test 01/25/2018-interpreted as positive because of initiation of hard coughing although FEV1 it only declined by 15%. She prefers Advair over Ada. Minimal sleep apnea to be managed conservatively with weight loss and sleep position off back.  08/14/2018-  58 year old female never smoker followed for Allergic rhinitis, Asthma, OSA, complicated by GERD -----Started Saturday as dry cough. Became productive yesterday. Thick chest congestion. She began increased wheezing 2 weeks ago.  Initially  she could manage with her nebulizer treatments but this weekend cough became more pronounced and she has coughed up some white phlegm after previously dry cough.  No purulent sputum or fever. Rib cage gets sore with continued coughing. She had to miss 2 days of work this week.  During exacerbations she usually expects to be out 2 or 3 days at a time.  How often this happens is variable but may be every 3 to 5 months.  Review of Systems-Per HPI.   + = positive Constitutional:   No-   weight loss, night sweats, fevers, chills, fatigue, lassitude. HEENT:   +  headaches, difficulty swallowing, tooth/dental problems, sore throat,       No-sneezing, itching, no-ear ache, + nasal congestion, no- post nasal drip,  CV:   chest pain, orthopnea, PND, swelling in lower extremities, anasarca, dizziness, palpitations Resp: No-   shortness of breath with exertion or at rest.             productive cough,  No- non-productive cough,  No- coughing up of blood.              No-   change in color of mucus. No- wheezing.   Skin:  GI:  No-   heartburn, indigestion, abdominal pain, nausea, vomiting,  GU: . MS:  No-   joint pain or swelling.  . Neuro-     nothing unusual Psych:  No- change in mood or affect. No depression or anxiety.  No memory loss.  Objective:   Physical Exam General- Alert, Oriented, Affect-appropriate, Distress- none acute; + Obese Skin- rash-none, lesions- none, excoriation- none Lymphadenopathy- none Head- atraumatic            Eyes- Gross vision intact, PERRLA, conjunctivae clear, no discolored secretions            Ears- Hearing, canals-normal  Nose-+ Sniffing, no-Septal dev, mucus, polyps, erosion, perforation             Throat- Mallampati III , mucosa clear , drainage- none, tonsils- atrophic Neck- flexible , trachea midline, no stridor , thyroid nl, carotid no bruit Chest - symmetrical excursion , unlabored           Heart/CV- RRR , no murmur , no gallop  , no rub, nl s1  s2                           - JVD- none , edema- none, stasis changes- none, varices- none           Lung- unlabored, wheeze+, cough + , dullness-none, rub- none,            Chest wall-  Abd-  Br/ Gen/ Rectal- Not done, not indicated Extrem- cyanosis- none, clubbing, none, atrophy- none, strength- nl Neuro- grossly intact to observation

## 2018-08-14 NOTE — Assessment & Plan Note (Signed)
Exacerbation of asthmatic bronchitis, probably viral. Plan-Depo-Medrol, Tessalon Perles, hydration and symptomatic therapy.  Continue usual meds for asthma with refills if needed.

## 2018-08-14 NOTE — Patient Instructions (Signed)
Script sent for tessalon perles for cough  You can also use an otc cough syrup or cough drops if helpful  Order- depo 80      Dx asthmatic bronchitis exacerbation

## 2018-08-14 NOTE — Assessment & Plan Note (Signed)
Current exacerbation is likely viral but nonspecific and unlikely to be influenza.  Options discussed.

## 2018-08-16 ENCOUNTER — Encounter: Payer: Self-pay | Admitting: Family Medicine

## 2018-08-16 DIAGNOSIS — R7303 Prediabetes: Secondary | ICD-10-CM

## 2018-08-16 NOTE — Telephone Encounter (Signed)
Katherine Tanner- is this considered a nutrition referral?  Let me know and I will place the appropriate referral

## 2018-08-21 ENCOUNTER — Telehealth: Payer: Self-pay | Admitting: Internal Medicine

## 2018-08-21 MED ORDER — PROMETHAZINE-CODEINE 6.25-10 MG/5ML PO SYRP: ORAL_SOLUTION | ORAL | 0 refills | Status: DC

## 2018-08-21 MED FILL — PROMETHAZINE W/COD SYRUP: 6.25-10 | 10 days supply | Qty: 200 | Fill #0

## 2018-08-21 NOTE — Telephone Encounter (Signed)
Called and spoke with Patient.  She stated that she is not feeling any better. She has a dry cough, sinus congestion with thick mucus, and  back tightness.  She stated that she has used her rescue inhaler 2-3 times per day.  Denies fever and chills.  She is taking tessalon, with little results. She stated that she was told by Dr Annamaria Boots, to call if she was not better.  Patient request any prescriptions to go to [facility] out patient pharmacy.  Allergies  Allergen Reactions  . Ace Inhibitors Shortness Of Breath and Cough    Cough/ breathing problems   . Amoxicillin-Pot Clavulanate Swelling    SWELLING REACTION UNSPECIFIED   . Influenza Virus Vacc Split Pf     UNSPECIFIED REACTION   . Shellfish Allergy Itching   Current Outpatient Medications on File Prior to Visit  Medication Sig Dispense Refill  . benzonatate (TESSALON) 200 MG capsule Take 1 capsule (200 mg total) by mouth 3 (three) times daily as needed for cough. 30 capsule 1  . Calcium Citrate-Vitamin D (CALCIUM + D PO) Take 1 tablet by mouth daily.    [name] EPINEPHRINE 0.3 mg/0.3 mL IJ SOAJ injection INJECT 1 SYRINGE INTO THE MUSCLE ONCE 1 Device 12  . fluticasone (FLONASE) 50 MCG/ACT nasal spray Place 2 sprays into both nostrils daily. 48 g 3  . Fluticasone-Salmeterol (ADVAIR) 250-50 MCG/DOSE AEPB Inhale 1 puff, then rinse mouth, once daily 60 each 11  . guaiFENesin (MUCINEX) 600 MG 12 hr tablet Take 600 mg by mouth 2 (two) times daily as needed.     [name] ipratropium-albuterol (DUONEB) 0.5-2.5 (3) MG/3ML SOLN USE 1 VIAL VIA NEBULIZER EVERY 6 HOURS AS NEEDED 90 mL 12  . loratadine (CLARITIN) 10 MG tablet Take 10 mg by mouth daily.     [name] losartan-hydrochlorothiazide (HYZAAR) 100-25 MG tablet Take 1 tablet by mouth daily. 90 tablet 3  . meloxicam (MOBIC) 15 MG tablet   2  . montelukast (SINGULAIR) 10 MG tablet TAKE 1 TABLET BY MOUTH AT BEDTIME. 90 tablet 3  . Multiple Vitamin (MULTIVITAMIN PO) Take 1 tablet by mouth daily.     . nabumetone (RELAFEN) 750  MG tablet Take 750 mg by mouth 2 (two) times daily as needed. Patient stated she only will take 1 tablet a day as needed    . Nebulizers (COMPRESSOR/NEBULIZER) MISC Use as directed 1 each 0  . traMADol (ULTRAM) 50 MG tablet Take 50 mg by mouth every 6 (six) hours as needed (1 or 2).    . VENTOLIN HFA 108 (90 Base) MCG/ACT inhaler INHALE 2 PUFFS INTO THE LUNGS EVERY 6 HOURS AS NEEDED FOR WHEEZING OR SHORTNESS OF BREATH. 18 g 12   No current facility-administered medications on file prior to visit.   message routed to Dr Annamaria Boots

## 2018-08-21 NOTE — Telephone Encounter (Signed)
Spoke with pt,aware of recs.  Nothing further needed.  

## 2018-08-21 NOTE — Telephone Encounter (Signed)
Script e-sent for codeine cough syrup, which may cause drowsiness. Stay well hydrated. If this continues we may need to see her.

## 2018-08-27 MED FILL — LOSARTAN-HCTZ 100-25 MG TAB: 100-25 | 30 days supply | Qty: 30 | Fill #1

## 2018-08-28 ENCOUNTER — Encounter (INDEPENDENT_AMBULATORY_CARE_PROVIDER_SITE_OTHER): Payer: Self-pay

## 2018-08-29 ENCOUNTER — Telehealth: Payer: Self-pay | Admitting: Internal Medicine

## 2018-08-29 NOTE — Telephone Encounter (Signed)
Placed FMLA paperwork in [employer] to do station. Once completed, will return back to [place].

## 2018-08-30 ENCOUNTER — Encounter (INDEPENDENT_AMBULATORY_CARE_PROVIDER_SITE_OTHER): Payer: Self-pay | Admitting: Family Medicine

## 2018-08-30 ENCOUNTER — Ambulatory Visit (INDEPENDENT_AMBULATORY_CARE_PROVIDER_SITE_OTHER): Payer: 59 | Admitting: Family Medicine

## 2018-08-30 VITALS — BP 114/70 | HR 70 | Temp 98.1°F | Ht 61.0 in | Wt 198.0 lb

## 2018-08-30 DIAGNOSIS — Z9189 Other specified personal risk factors, not elsewhere classified: Secondary | ICD-10-CM | POA: Diagnosis not present

## 2018-08-30 DIAGNOSIS — R5383 Other fatigue: Secondary | ICD-10-CM | POA: Diagnosis not present

## 2018-08-30 DIAGNOSIS — Z6837 Body mass index (BMI) 37.0-37.9, adult: Secondary | ICD-10-CM | POA: Diagnosis not present

## 2018-08-30 DIAGNOSIS — I10 Essential (primary) hypertension: Secondary | ICD-10-CM

## 2018-08-30 DIAGNOSIS — Z1331 Encounter for screening for depression: Secondary | ICD-10-CM

## 2018-08-30 DIAGNOSIS — R7303 Prediabetes: Secondary | ICD-10-CM | POA: Diagnosis not present

## 2018-08-30 DIAGNOSIS — Z0289 Encounter for other administrative examinations: Secondary | ICD-10-CM

## 2018-08-30 DIAGNOSIS — R0602 Shortness of breath: Secondary | ICD-10-CM | POA: Diagnosis not present

## 2018-08-30 NOTE — Progress Notes (Signed)
.  Office: [phone]  /  Fax: [phone]   HPI:   Chief Complaint: OBESITY  Katherine Tanner (MR# [id number]) is a 58 y.o. female who presents on 08/30/2018 for obesity evaluation and treatment. She reports hearing about our clinic from a co-worker. Current BMI is Body mass index is 37.41 kg/m.[name] Katherine Tanner has struggled with obesity for years and has been unsuccessful in either losing weight or maintaining long term weight loss. Katherine Tanner attended our information session and states she is currently in the action stage of change and ready to dedicate time achieving and maintaining a healthier weight.   Katherine Tanner states her family eats meals together she thinks her family will eat healthier with  her her desired weight loss is 18-23 lbs she has been heavy most of  her life she started gaining weight after pregnancy her heaviest weight ever was 200 lbs. she has significant food cravings issues with sugar and salt she snacks frequently in the evenings she skips meals frequently she is frequently drinking liquids with calories   Fatigue Katherine Tanner feels her energy is lower than it should be. This has worsened with weight gain and has worsened recently. Katherine Tanner admits to daytime somnolence and  denies waking up still tired. Patient is at risk for obstructive sleep apnea. Patent has a history of symptoms of daytime fatigue. Patient generally gets 5 hours of sleep per night, and states they generally do not sleep well most nights. Snoring is present. Apneic episodes are present. Epworth Sleepiness Score is 5. EKG showed NSR.  Dyspnea on exertion Katherine Tanner notes increasing shortness of breath with exercising and seems to be worsening over time with weight gain. She notes getting out of breath sooner with activity than she used to. This has gotten worse recently. Katherine Tanner denies orthopnea.  Hypertension Katherine Tanner is a 58 y.o. female with hypertension.  Katherine Tanner denies chest  pain or shortness of breath on exertion. She is working weight loss to help control her blood pressure with the goal of decreasing her risk of heart attack and stroke. Katherine Tanner's blood pressure is currently controlled. She denies chest pain, chest pressure, or headache.  Pre-Diabetes Katherine Tanner has a diagnosis of prediabetes for at least 3 years based on her elevated Hgb A1c and was informed this puts her at greater risk of developing diabetes. She is not taking metformin currently and continues to work on diet and exercise to decrease risk of diabetes. She denies nausea or hypoglycemia.  Diabetes risk counseling Katherine Tanner was given extended (15 minutes) diabetes prevention counseling today. She is 58 y.o. female and has risk factors for diabetes including obesity. We discussed intensive lifestyle modifications today with an emphasis on weight loss as well as increasing exercise and decreasing simple carbohydrates in her diet.  Depression Screen Katherine Tanner's Food and Mood (modified PHQ-9) score was 5.  Depression screen Katherine Tanner 2/9 08/30/2018  Decreased Interest 1  Down, Depressed, Hopeless 0  PHQ - 2 Score 1  Altered sleeping 1  Tired, decreased energy 2  Change in appetite 1  Feeling bad or failure about yourself  0  Trouble concentrating 0  Moving slowly or fidgety/restless 0  Suicidal thoughts 0  PHQ-9 Score 5  Difficult doing work/chores Not difficult at all   ASSESSMENT AND PLAN:  Other fatigue - Plan: EKG 12-Lead, Vitamin B12, VITAMIN D 25 Hydroxy (Vit-D Deficiency, Fractures), Folate, T3, T4, free  Shortness of breath on exertion  Essential hypertension  Prediabetes - Plan: Hemoglobin A1c, Insulin, random  Depression screening  At risk for diabetes mellitus  Class 2 severe obesity with serious comorbidity and body mass index (BMI) of 37.0 to 37.9 in adult, unspecified obesity type ([place])  PLAN:  Fatigue Katherine Tanner was informed that her fatigue may be related to obesity, depression or  many other causes. Labs will be ordered, and in the meanwhile Katherine Tanner has agreed to work on diet, exercise and weight loss to help with fatigue. Proper sleep hygiene was discussed including the need for 7-8 hours of quality sleep each night. A sleep study was not ordered based on symptoms and Epworth score. We will obtain EKG, IC, and labs today.  Dyspnea on exertion Katherine Tanner's shortness of breath appears to be obesity related and exercise induced. She has agreed to work on weight loss and gradually increase exercise to treat her exercise induced shortness of breath. If Katherine Tanner follows our instructions and loses weight without improvement of her shortness of breath, we will plan to refer to pulmonology. We will monitor this condition regularly. Katherine Tanner agrees to this plan.  Hypertension We discussed sodium restriction, working on healthy weight loss, and a regular exercise program as the means to achieve improved blood pressure control. Katherine Tanner agreed with this plan and agreed to follow up as directed. We will continue to monitor her blood pressure as well as her progress with the above lifestyle modifications. She will continue her medications as prescribed and will watch for signs of hypotension as she continues her lifestyle modifications.  Pre-Diabetes Katherine Tanner will continue to work on weight loss, exercise, and decreasing simple carbohydrates in her diet to help decrease the risk of diabetes. We dicussed metformin including benefits and risks. She was informed that eating too many simple carbohydrates or too many calories at one sitting increases the likelihood of GI side effects. Katherine Tanner will have Hgb A1C and insulin drawn today. She agrees to follow-up with [name] as directed to monitor her progress.  Diabetes risk counseling Katherine Tanner was given extended (15 minutes) diabetes prevention counseling today. She is 58 y.o. female and has risk factors for diabetes including obesity. We discussed intensive lifestyle  modifications today with an emphasis on weight loss as well as increasing exercise and decreasing simple carbohydrates in her diet.  Depression Screen Katherine Tanner had a mildly positive depression screening. Depression is commonly associated with obesity and often results in emotional eating behaviors. We will monitor this closely and work on CBT to help improve the non-hunger eating patterns. Referral to Psychology may be required if no improvement is seen as she continues in our clinic.  Obesity Katherine Tanner is currently in the action stage of change and her goal is to continue with weight loss efforts She has agreed to follow the Category 2 plan plus 100 calories. Katherine Tanner has been instructed to work up to a goal of 150 minutes of combined cardio and strengthening exercise per week for weight loss and overall health benefits. We discussed the following Behavioral Modification Strategies today: increasing lean protein intake, increasing vegetables, work on meal planning and easy cooking plans, and planning for success.  Katherine Tanner has agreed to follow-up with our clinic in 2 weeks. She was informed of the importance of frequent follow up visits to maximize her success with intensive lifestyle modifications for her multiple health conditions. She was informed we would discuss her lab results at her next visit unless there is a critical issue that needs to be addressed sooner. Katherine Tanner agreed to keep her next visit at the agreed upon time to discuss these results.  ALLERGIES: Allergies  Allergen Reactions  . Ace Inhibitors Shortness Of Breath and Cough    Cough/ breathing problems   . Amoxicillin-Pot Clavulanate Swelling    SWELLING REACTION UNSPECIFIED   . Influenza Virus Vacc Split Pf     UNSPECIFIED REACTION   . Shellfish Allergy Itching    MEDICATIONS: Current Outpatient Medications on File Prior to Visit  Medication Sig Dispense Refill  . benzonatate (TESSALON) 200 MG capsule Take 1 capsule (200 mg  total) by mouth 3 (three) times daily as needed for cough. 30 capsule 1  . Calcium Citrate-Vitamin D (CALCIUM + D PO) Take 1 tablet by mouth daily.    [name] EPINEPHRINE 0.3 mg/0.3 mL IJ SOAJ injection INJECT 1 SYRINGE INTO THE MUSCLE ONCE 1 Device 12  . fluticasone (FLONASE) 50 MCG/ACT nasal spray Place 2 sprays into both nostrils daily. 48 g 3  . Fluticasone-Salmeterol (ADVAIR) 250-50 MCG/DOSE AEPB Inhale 1 puff, then rinse mouth, once daily 60 each 11  . guaiFENesin (MUCINEX) 600 MG 12 hr tablet Take 600 mg by mouth 2 (two) times daily as needed.     [name] ipratropium-albuterol (DUONEB) 0.5-2.5 (3) MG/3ML SOLN USE 1 VIAL VIA NEBULIZER EVERY 6 HOURS AS NEEDED 90 mL 12  . loratadine (CLARITIN) 10 MG tablet Take 10 mg by mouth daily.     [name] losartan-hydrochlorothiazide (HYZAAR) 100-25 MG tablet Take 1 tablet by mouth daily. 90 tablet 3  . montelukast (SINGULAIR) 10 MG tablet TAKE 1 TABLET BY MOUTH AT BEDTIME. 90 tablet 3  . Multiple Vitamin (MULTIVITAMIN PO) Take 1 tablet by mouth daily.     . nabumetone (RELAFEN) 750 MG tablet Take 750 mg by mouth 2 (two) times daily as needed. Patient stated she only will take 1 tablet a day as needed    . Nebulizers (COMPRESSOR/NEBULIZER) MISC Use as directed 1 each 0  . promethazine-codeine (PHENERGAN WITH CODEINE) 6.25-10 MG/5ML syrup 5 ml every 6 hours if needed for cough 200 mL 0  . VENTOLIN HFA 108 (90 Base) MCG/ACT inhaler INHALE 2 PUFFS INTO THE LUNGS EVERY 6 HOURS AS NEEDED FOR WHEEZING OR SHORTNESS OF BREATH. 18 g 12   No current facility-administered medications on file prior to visit.     PAST MEDICAL HISTORY: Past Medical History:  Diagnosis Date  . Allergy    takes Claritin daily and Flonase daily  . Arthritis   . Asthma    uses Albuterol daily as needed;Singulair nightly;DUlera daily  . Back pain   . Constipation   . Fatty liver   . Food allergy   . GERD (gastroesophageal reflux disease)    occasionally will take OTC meds but states she just  watches what she eats  . H/O hiatal hernia   . History of bronchitis    last time 19yrs ago  . History of colon polyps   . History of kidney stones   . History of shingles   . Hyperlipidemia   . Hypertension    takes Hyzaar daily  . Joint pain   . Joint pain   . Joint swelling   . Obesity   . Osteoarthritis   . Pneumonia    hx of;last time at least 57yrs ago  . PONV (postoperative nausea and vomiting)   . Prediabetes   . Shortness of breath   . Sleep apnea   . Swelling of lower extremity   . Trouble in sleeping   . Wheezing     PAST SURGICAL HISTORY: Past Surgical History:  Procedure Laterality Date  . BREAST EXCISIONAL BIOPSY Right   . CARDIAC CATHETERIZATION    . COLONOSCOPY    . ESOPHAGOGASTRODUODENOSCOPY    . KNEE SURGERY Right    arthroscopy  . Right ganglion cyst     x 2  . right lumpectomy  around 1983  . STERIOD INJECTION Left 11/15/2013   Procedure: Marcaine/STEROID INJECTION;  Surgeon: Alta Corning, MD;  Location: [facility];  Service: Orthopedics;  Laterality: Left;  . TOTAL KNEE ARTHROPLASTY Right 11/15/2013   Procedure: RIGHT TOTAL KNEE ARTHROPLASTY;  Surgeon: Alta Corning, MD;  Location: [facility];  Service: Orthopedics;  Laterality: Right;  . TOTAL KNEE ARTHROPLASTY Left 12/30/2016   Procedure: TOTAL KNEE ARTHROPLASTY;  Surgeon: Dorna Leitz, MD;  Location: [facility];  Service: Orthopedics;  Laterality: Left;  . TUBAL LIGATION      SOCIAL HISTORY: Social History   Tobacco Use  . Smoking status: Never Smoker  . Smokeless tobacco: Never Used  Substance Use Topics  . Alcohol use: No    Alcohol/week: 0.0 standard drinks  . Drug use: No    FAMILY HISTORY: Family History  Problem Relation Age of Onset  . Diabetes Father   . Cancer Father   . Liver disease Father   . Alcoholism Father   . Sarcoidosis Mother   . Glaucoma Mother   . Hypertension Mother   . Hyperlipidemia Mother   . Sleep apnea Mother   . Obesity Mother   . Breast cancer Neg Hx     ROS: Review of Systems  Eyes:       Positive for wearing glasses or contacts.  Respiratory: Positive for shortness of breath and wheezing.   Cardiovascular: Negative for orthopnea.  Musculoskeletal: Positive for joint pain.  Psychiatric/Behavioral: The patient has insomnia.    PHYSICAL EXAM: Blood pressure 114/70, pulse 70, temperature 98.1 F (36.7 C), temperature source Oral, height 5\' 1"  (1.549 m), weight 198 lb (89.8 kg), last menstrual period 02/20/2014, SpO2 99 %. Body mass index is 37.41 kg/m. Physical Exam Vitals signs reviewed.  Constitutional:      Appearance: Normal appearance. She is well-developed. She is obese.  HENT:     Head: Normocephalic and atraumatic.     Nose: Nose normal.  Eyes:     General: No scleral icterus. Neck:     Musculoskeletal: Normal range of motion.  Cardiovascular:     Rate and Rhythm: Normal rate and regular rhythm.  Pulmonary:     Effort: Pulmonary effort is normal. No respiratory distress.  Abdominal:     Palpations: Abdomen is soft.     Tenderness: There is no abdominal tenderness.  Musculoskeletal: Normal range of motion.     Comments: Range of motion normal in all four extremities.  Skin:    General: Skin is warm and dry.  Neurological:     Mental Status: She is alert and oriented to person, place, and time.     Coordination: Coordination normal.  Psychiatric:        Mood and Affect: Mood and affect normal.        Behavior: Behavior normal.   RECENT LABS AND TESTS: BMET    Component Value Date/Time   NA 140 07/02/2018 1544   K 3.7 07/02/2018 1544   CL 100 07/02/2018 1544   CO2 30 07/02/2018 1544   GLUCOSE 80 07/02/2018 1544   BUN 17 07/02/2018 1544   CREATININE 0.72 07/02/2018 1544   CALCIUM 10.4 07/02/2018 1544   GFRNONAA >  60 07/07/2017 1207   GFRAA >60 07/07/2017 1207   Lab Results  Component Value Date   HGBA1C 6.1 06/08/2017   No results found for: INSULIN CBC    Component Value Date/Time   WBC 9.8  07/02/2018 1544   RBC 4.76 07/02/2018 1544   HGB 13.7 07/02/2018 1544   HCT 41.8 07/02/2018 1544   PLT 125.0 (L) 07/02/2018 1544   MCV 87.9 07/02/2018 1544   MCH 28.8 07/07/2017 1207   MCHC 32.8 07/02/2018 1544   RDW 14.0 07/02/2018 1544   LYMPHSABS 2.7 07/02/2018 1544   MONOABS 1.0 07/02/2018 1544   EOSABS 0.2 07/02/2018 1544   BASOSABS 0.1 07/02/2018 1544   Iron/TIBC/Ferritin/ %Sat No results found for: IRON, TIBC, FERRITIN, IRONPCTSAT Lipid Panel     Component Value Date/Time   CHOL 209 (H) 07/02/2018 1544   TRIG 97.0 07/02/2018 1544   HDL 61.40 07/02/2018 1544   CHOLHDL 3 07/02/2018 1544   VLDL 19.4 07/02/2018 1544   LDLCALC 128 (H) 07/02/2018 1544   LDLDIRECT 147.9 12/17/2012 1604   Hepatic Function Panel     Component Value Date/Time   PROT 7.8 07/02/2018 1544   ALBUMIN 4.6 07/02/2018 1544   AST 22 07/02/2018 1544   ALT 29 07/02/2018 1544   ALKPHOS 119 (H) 07/02/2018 1544   BILITOT 0.4 07/02/2018 1544   BILIDIR 0.1 01/02/2012 1626   IBILI 0.3 01/05/2010 2038      Component Value Date/Time   TSH 2.36 07/02/2018 1544   No Vitamin D level recorded.  ECG shows sinus rhythm with a rate of 71 BPM, low voltage in precordial leads.   INDIRECT CALORIMETER done today shows a VO2 of 223 and a REE of 1554. Her calculated basal metabolic rate is 7564 thus her basal metabolic rate is better than expected.  OBESITY BEHAVIORAL INTERVENTION VISIT  Today's visit was #1  Starting weight: 198 lbs Starting date: 08/30/2018 Today's weight: 198 lbs Today's date: 08/30/2018 Total lbs lost to date: 0    08/30/2018  Height 5\' 1"  (1.549 m)  Weight 198 lb (89.8 kg)  BMI (Calculated) 37.43  BLOOD PRESSURE - SYSTOLIC 332  BLOOD PRESSURE - DIASTOLIC 70  Waist Measurement  46 inches   Body Fat % 46.3 %  Total Body Water (lbs) 74.6 lbs  RMR 1554   ASK: We discussed the diagnosis of obesity with Kyndel Tanner today and Raul agreed to give [name] permission to discuss  obesity behavioral modification therapy today.  ASSESS: Takeela has the diagnosis of obesity and her BMI today is 37.43. Satonya is in the action stage of change.   ADVISE: Araseli was educated on the multiple health risks of obesity as well as the benefit of weight loss to improve her health. She was advised of the need for long term treatment and the importance of lifestyle modifications to improve her current health and to decrease her risk of future health problems.  AGREE: Multiple dietary modification options and treatment options were discussed and  Zamira agreed to follow the recommendations documented in the above note.  ARRANGE: Yuritzy was educated on the importance of frequent visits to treat obesity as outlined per CMS and USPSTF guidelines and agreed to schedule her next follow up appointment today.  I, Michaelene Song, am acting as transcriptionist for Ilene Qua, MD   I have reviewed the above documentation for accuracy and completeness, and I agree with the above. - Ilene Qua, MD

## 2018-08-31 LAB — T4, FREE: Free T4: 1.52 ng/dL (ref 0.82–1.77)

## 2018-08-31 LAB — FOLATE: Folate: >20 ng/mL (ref >3.0)

## 2018-08-31 LAB — VITAMIN B12: Vitamin B-12: >2000 pg/mL — ABNORMAL HIGH (ref 232–1245)

## 2018-08-31 LAB — VITAMIN D 25 HYDROXY (VIT D DEFICIENCY, FRACTURES): Vit D, 25-Hydroxy: 25.3 ng/mL — ABNORMAL LOW (ref 30.0–100.0)

## 2018-08-31 LAB — HEMOGLOBIN A1C
ESTIMATED AVERAGE GLUCOSE: 123 mg/dL
HEMOGLOBIN A1C: 5.9 % — AB (ref 4.8–5.6)

## 2018-08-31 LAB — INSULIN, RANDOM: INSULIN: 9.3 u[IU]/mL (ref 2.6–24.9)

## 2018-08-31 LAB — T3: T3, Total: 110 ng/dL (ref 71–180)

## 2018-09-03 NOTE — Telephone Encounter (Signed)
Dr. Annamaria Boots is aware of paperwork as states he will put in his to do pile.

## 2018-09-03 NOTE — Telephone Encounter (Signed)
Since Lauren and Annie Paras are currently working with CY this week, routing this to them for a status update.  Please advise if CY has finished the [facility] paperwork yet and if it has been returned back to [place]. Thanks!

## 2018-09-03 NOTE — Telephone Encounter (Signed)
Returned phone call to patient, info obtained:  1. Why is the patient needing this paperwork? Injury? Asthma, if she is out more than 3 days she has to have FMLA filled out. She states this is [employer].   2. Has patient already gone back to work? If not how long does she need to be out for? She returned to work 08/16/18. She was out the 10, 11th, and 12th.   3. How often does this happen that she needs to be out of work? And how many days at a time is she usually out when she is out? She states it does not happen often but typically when she has a flare up she is out 2-3 days.   CY please advise if more information is needed or once paper work has been finalized and ready for patient. Thanks.

## 2018-09-03 NOTE — Telephone Encounter (Signed)
Form is completed.

## 2018-09-03 NOTE — Telephone Encounter (Signed)
Patient returned phone call.  Patient phone number is [phone].

## 2018-09-03 NOTE — Telephone Encounter (Signed)
[name] patient and get more information regarding FMLA paperwork so Dr. Annamaria Boots can fill out and complete.   1. Why is the patient needing this paperwork? Injury? Reason... 2. Has patient already gone back to work? If not how long does she need to be out for? 3. How often does this happen that she needs to be out of work? And how many days at a time is she usually out when she is out?  Once patient calls back and information is obtained Dr. Annamaria Boots can finish paperwork.   Will route to Triage to follow up on

## 2018-09-03 NOTE — Telephone Encounter (Signed)
LMTCB

## 2018-09-04 NOTE — Telephone Encounter (Signed)
Rec'd completed paperwork from [place] to [employer] via interoffice mail -pr

## 2018-09-13 ENCOUNTER — Encounter (INDEPENDENT_AMBULATORY_CARE_PROVIDER_SITE_OTHER): Payer: Self-pay | Admitting: Family Medicine

## 2018-09-13 ENCOUNTER — Other Ambulatory Visit: Payer: Self-pay

## 2018-09-13 ENCOUNTER — Ambulatory Visit (INDEPENDENT_AMBULATORY_CARE_PROVIDER_SITE_OTHER): Payer: 59 | Admitting: Family Medicine

## 2018-09-13 VITALS — BP 109/69 | HR 74 | Temp 97.8°F | Ht 61.0 in | Wt 197.0 lb

## 2018-09-13 DIAGNOSIS — R7303 Prediabetes: Secondary | ICD-10-CM

## 2018-09-13 DIAGNOSIS — E7849 Other hyperlipidemia: Secondary | ICD-10-CM

## 2018-09-13 DIAGNOSIS — E559 Vitamin D deficiency, unspecified: Secondary | ICD-10-CM

## 2018-09-13 DIAGNOSIS — I10 Essential (primary) hypertension: Secondary | ICD-10-CM

## 2018-09-13 DIAGNOSIS — Z6837 Body mass index (BMI) 37.0-37.9, adult: Secondary | ICD-10-CM | POA: Diagnosis not present

## 2018-09-13 DIAGNOSIS — Z9189 Other specified personal risk factors, not elsewhere classified: Secondary | ICD-10-CM

## 2018-09-13 MED ORDER — VITAMIN D (ERGOCALCIFEROL) 1.25 MG (50000 UNIT) PO CAPS: 50000.0000 [IU] | ORAL_CAPSULE | ORAL | 0 refills | Status: DC

## 2018-09-13 MED ORDER — METFORMIN HCL 500 MG PO TABS: 500.0000 mg | ORAL_TABLET | Freq: Every day | ORAL | 0 refills | Status: DC

## 2018-09-13 MED FILL — VIT D2 1.25 MG (50,000 UNIT: 1.25 MG | 28 days supply | Qty: 4 | Fill #0

## 2018-09-13 MED FILL — metFORMIN HCL 500 MG TABS: 500 | 30 days supply | Qty: 30 | Fill #0

## 2018-09-13 NOTE — Progress Notes (Signed)
Office: [phone]  /  Fax: [phone]   HPI:   Chief Complaint: OBESITY Katherine Tanner is here to discuss her progress with her obesity treatment plan. She is on the Category 2 plan + 100 calories and is following her eating plan approximately 100 % of the time. She states she is doing weight training and cardio for 30-40 minutes 5 times per week. Priseis is hungry at night prior to bed. She is unsure if she is hungry because she gets up so early and then eats later. She is doing [employer], teddy grahams, walnuts for snacks.  Her weight is 197 lb (89.4 kg) today and has had a weight loss of 1 pound over a period of 2 weeks since her last visit. She has lost 1 lb since starting treatment with [name].  Hyperlipidemia [name] has hyperlipidemia and has been trying to improve her cholesterol levels with intensive lifestyle modification including a low saturated fat diet, exercise and weight loss. Her LDL is of 128 and HDL of 61. She denies any chest pain, claudication or myalgias.  Vitamin D Deficiency [name] has a diagnosis of vitamin D deficiency. She is on OTC Ca+ Vit D supplementation. She notes and denies nausea, vomiting or muscle weakness.  At risk for osteopenia and osteoporosis [name] is at higher risk of osteopenia and osteoporosis due to vitamin D deficiency.   Hypertension [name] is a 58 y.o. female with hypertension. [name]'s blood pressure is well controlled. She denies chest pain, chest pressure, or headaches. She is working on weight loss to help control her blood pressure with the goal of decreasing her risk of heart attack and stroke.   Pre-Diabetes [name] has a diagnosis of pre-diabetes based on her elevated Hgb A1c at 5.9 and insulin at 9.3, and has had this diagnosis for at least 4 years. She was informed this puts her at greater risk of developing diabetes. She is not taking metformin currently and continues to work on diet and exercise to decrease risk of  diabetes. She denies nausea or hypoglycemia.  ASSESSMENT AND PLAN:  Other hyperlipidemia  Vitamin D deficiency - Plan: Vitamin D, Ergocalciferol, (DRISDOL) 1.25 MG (50000 UT) CAPS capsule, DISCONTINUED: Vitamin D, Ergocalciferol, (DRISDOL) 1.25 MG (50000 UT) CAPS capsule  Prediabetes - Plan: metFORMIN (GLUCOPHAGE) 500 MG tablet  Essential hypertension  At risk for osteoporosis  Class 2 severe obesity with serious comorbidity and body mass index (BMI) of 37.0 to 37.9 in adult, unspecified obesity type ([place])  PLAN:  Hyperlipidemia [name] was informed of the American Heart Association Guidelines emphasizing intensive lifestyle modifications as the first line treatment for hyperlipidemia. We discussed many lifestyle modifications today in depth, and [name] will continue to work on decreasing saturated fats such as fatty red meat, butter and many fried foods. She will also increase vegetables and lean protein in her diet and continue to work on exercise and weight loss efforts. We will repeat FLP in 3 months. Piya agrees to follow up with our clinic in 2 weeks.  Vitamin D Deficiency [name] was informed that low vitamin D levels contributes to fatigue and are associated with obesity, breast, and colon cancer. [name] agrees to start prescription Vit D @50 ,000 IU every week #4 with no refills, and stop OTC. She will follow up for routine testing of vitamin D, at least 2-3 times per year. She was informed of the risk of over-replacement of vitamin D and agrees to not increase her dose unless she discusses this with [name] first.  [name] agrees to follow up with our clinic in 2 weeks.  At risk for osteopenia and osteoporosis Trace was given extended (30 minutes) osteoporosis prevention counseling today. [name] is at risk for osteopenia and osteoporsis due to her vitamin D deficiency. She was encouraged to take her vitamin D and follow her higher calcium diet and increase strengthening exercise to help  strengthen her bones and decrease her risk of osteopenia and osteoporosis.  Hypertension We discussed sodium restriction, working on healthy weight loss, and a regular exercise program as the means to achieve improved blood pressure control. [name] agreed with this plan and agreed to follow up as directed. We will continue to monitor her blood pressure as well as her progress with the above lifestyle modifications. [name] agrees to continue her current medications and will watch for signs of hypotension as she continues her lifestyle modifications. [name] agrees to follow up with our clinic in 2 weeks.  Pre-Diabetes [name] will continue to work on weight loss, exercise, and decreasing simple carbohydrates in her diet to help decrease the risk of diabetes. We dicussed metformin including benefits and risks. She was informed that eating too many simple carbohydrates or too many calories at one sitting increases the likelihood of GI side effects. [name] agrees to start metformin 500 mg PO q AM #30 with no refills. [name] agrees to follow up with our clinic in 2 weeks as directed to monitor her progress.  Obesity [name] is currently in the action stage of change. As such, her goal is to continue with weight loss efforts She has agreed to follow the Category 2 plan + 100 calories [name] has been instructed to work up to a goal of 150 minutes of combined cardio and strengthening exercise per week for weight loss and overall health benefits. We discussed the following Behavioral Modification Strategies today: increasing lean protein intake, increasing vegetables, work on meal planning and easy cooking plans, better snacking choices, and planning for success   [name] has agreed to follow up with our clinic in 2 weeks. She was informed of the importance of frequent follow up visits to maximize her success with intensive lifestyle modifications for her multiple health conditions.  ALLERGIES: Allergies   Allergen Reactions  . Ace Inhibitors Shortness Of Breath and Cough    Cough/ breathing problems   . Amoxicillin-Pot Clavulanate Swelling    SWELLING REACTION UNSPECIFIED   . Influenza Virus Vacc Split Pf     UNSPECIFIED REACTION   . Shellfish Allergy Itching    MEDICATIONS: Current Outpatient Medications on File Prior to Visit  Medication Sig Dispense Refill  . benzonatate (TESSALON) 200 MG capsule Take 1 capsule (200 mg total) by mouth 3 (three) times daily as needed for cough. 30 capsule 1  . Calcium Citrate-Vitamin D (CALCIUM + D PO) Take 1 tablet by mouth daily.    [name] EPINEPHRINE 0.3 mg/0.3 mL IJ SOAJ injection INJECT 1 SYRINGE INTO THE MUSCLE ONCE 1 Device 12  . fluticasone (FLONASE) 50 MCG/ACT nasal spray Place 2 sprays into both nostrils daily. 48 g 3  . Fluticasone-Salmeterol (ADVAIR) 250-50 MCG/DOSE AEPB Inhale 1 puff, then rinse mouth, once daily 60 each 11  . guaiFENesin (MUCINEX) 600 MG 12 hr tablet Take 600 mg by mouth 2 (two) times daily as needed.     [name] ipratropium-albuterol (DUONEB) 0.5-2.5 (3) MG/3ML SOLN USE 1 VIAL VIA NEBULIZER EVERY 6 HOURS AS NEEDED 90 mL 12  . loratadine (CLARITIN) 10 MG tablet Take 10 mg by mouth  daily.     . losartan-hydrochlorothiazide (HYZAAR) 100-25 MG tablet Take 1 tablet by mouth daily. 90 tablet 3  . montelukast (SINGULAIR) 10 MG tablet TAKE 1 TABLET BY MOUTH AT BEDTIME. 90 tablet 3  . Multiple Vitamin (MULTIVITAMIN PO) Take 1 tablet by mouth daily.     . nabumetone (RELAFEN) 750 MG tablet Take 750 mg by mouth 2 (two) times daily as needed. Patient stated she only will take 1 tablet a day as needed    . Nebulizers (COMPRESSOR/NEBULIZER) MISC Use as directed 1 each 0  . promethazine-codeine (PHENERGAN WITH CODEINE) 6.25-10 MG/5ML syrup 5 ml every 6 hours if needed for cough 200 mL 0  . VENTOLIN HFA 108 (90 Base) MCG/ACT inhaler INHALE 2 PUFFS INTO THE LUNGS EVERY 6 HOURS AS NEEDED FOR WHEEZING OR SHORTNESS OF BREATH. 18 g 12   No current  facility-administered medications on file prior to visit.     PAST MEDICAL HISTORY: Past Medical History:  Diagnosis Date  . Allergy    takes Claritin daily and Flonase daily  . Arthritis   . Asthma    uses Albuterol daily as needed;Singulair nightly;DUlera daily  . Back pain   . Constipation   . Fatty liver   . Food allergy   . GERD (gastroesophageal reflux disease)    occasionally will take OTC meds but states she just watches what she eats  . H/O hiatal hernia   . History of bronchitis    last time 55yrs ago  . History of colon polyps   . History of kidney stones   . History of shingles   . Hyperlipidemia   . Hypertension    takes Hyzaar daily  . Joint pain   . Joint pain   . Joint swelling   . Obesity   . Osteoarthritis   . Pneumonia    hx of;last time at least 67yrs ago  . PONV (postoperative nausea and vomiting)   . Prediabetes   . Shortness of breath   . Sleep apnea   . Swelling of lower extremity   . Trouble in sleeping   . Wheezing     PAST SURGICAL HISTORY: Past Surgical History:  Procedure Laterality Date  . BREAST EXCISIONAL BIOPSY Right   . CARDIAC CATHETERIZATION    . COLONOSCOPY    . ESOPHAGOGASTRODUODENOSCOPY    . KNEE SURGERY Right    arthroscopy  . Right ganglion cyst     x 2  . right lumpectomy  around 1983  . STERIOD INJECTION Left 11/15/2013   Procedure: Marcaine/STEROID INJECTION;  Surgeon: Alta Corning, MD;  Location: [facility];  Service: Orthopedics;  Laterality: Left;  . TOTAL KNEE ARTHROPLASTY Right 11/15/2013   Procedure: RIGHT TOTAL KNEE ARTHROPLASTY;  Surgeon: Alta Corning, MD;  Location: [place];  Service: Orthopedics;  Laterality: Right;  . TOTAL KNEE ARTHROPLASTY Left 12/30/2016   Procedure: TOTAL KNEE ARTHROPLASTY;  Surgeon: Dorna Leitz, MD;  Location: [place];  Service: Orthopedics;  Laterality: Left;  . TUBAL LIGATION      SOCIAL HISTORY: Social History   Tobacco Use  . Smoking status: Never Smoker  . Smokeless tobacco:  Never Used  Substance Use Topics  . Alcohol use: No    Alcohol/week: 0.0 standard drinks  . Drug use: No    FAMILY HISTORY: Family History  Problem Relation Age of Onset  . Diabetes Father   . Cancer Father   . Liver disease Father   . Alcoholism Father   .  Sarcoidosis Mother   . Glaucoma Mother   . Hypertension Mother   . Hyperlipidemia Mother   . Sleep apnea Mother   . Obesity Mother   . Breast cancer Neg Hx     ROS: Review of Systems  Constitutional: Positive for malaise/fatigue and weight loss.  Cardiovascular: Negative for chest pain and claudication.       Negative chest pressure  Gastrointestinal: Negative for nausea and vomiting.  Musculoskeletal: Negative for myalgias.       Negative muscle weakness  Neurological: Negative for headaches.  Endo/Heme/Allergies:       Negative hypoglycemia    PHYSICAL EXAM: Blood pressure 109/69, pulse 74, temperature 97.8 F (36.6 C), temperature source Oral, height 5\' 1"  (1.549 m), weight 197 lb (89.4 kg), last menstrual period 02/20/2014, SpO2 99 %. Body mass index is 37.22 kg/m. Physical Exam Vitals signs reviewed.  Constitutional:      Appearance: Normal appearance. She is obese.  Cardiovascular:     Rate and Rhythm: Normal rate.     Pulses: Normal pulses.  Pulmonary:     Effort: Pulmonary effort is normal.     Breath sounds: Normal breath sounds.  Musculoskeletal: Normal range of motion.  Skin:    General: Skin is warm and dry.  Neurological:     Mental Status: She is alert and oriented to person, place, and time.  Psychiatric:        Mood and Affect: Mood normal.        Behavior: Behavior normal.     RECENT LABS AND TESTS: BMET    Component Value Date/Time   NA 140 07/02/2018 1544   K 3.7 07/02/2018 1544   CL 100 07/02/2018 1544   CO2 30 07/02/2018 1544   GLUCOSE 80 07/02/2018 1544   BUN 17 07/02/2018 1544   CREATININE 0.72 07/02/2018 1544   CALCIUM 10.4 07/02/2018 1544   GFRNONAA >60 07/07/2017  1207   GFRAA >60 07/07/2017 1207   Lab Results  Component Value Date   HGBA1C 5.9 (H) 08/30/2018   HGBA1C 6.1 06/08/2017   HGBA1C 6.1 06/15/2016   HGBA1C 6.1 06/05/2015   HGBA1C 6.0 11/17/2014   Lab Results  Component Value Date   INSULIN 9.3 08/30/2018   CBC    Component Value Date/Time   WBC 9.8 07/02/2018 1544   RBC 4.76 07/02/2018 1544   HGB 13.7 07/02/2018 1544   HCT 41.8 07/02/2018 1544   PLT 125.0 (L) 07/02/2018 1544   MCV 87.9 07/02/2018 1544   MCH 28.8 07/07/2017 1207   MCHC 32.8 07/02/2018 1544   RDW 14.0 07/02/2018 1544   LYMPHSABS 2.7 07/02/2018 1544   MONOABS 1.0 07/02/2018 1544   EOSABS 0.2 07/02/2018 1544   BASOSABS 0.1 07/02/2018 1544   Iron/TIBC/Ferritin/ %Sat No results found for: IRON, TIBC, FERRITIN, IRONPCTSAT Lipid Panel     Component Value Date/Time   CHOL 209 (H) 07/02/2018 1544   TRIG 97.0 07/02/2018 1544   HDL 61.40 07/02/2018 1544   CHOLHDL 3 07/02/2018 1544   VLDL 19.4 07/02/2018 1544   LDLCALC 128 (H) 07/02/2018 1544   LDLDIRECT 147.9 12/17/2012 1604   Hepatic Function Panel     Component Value Date/Time   PROT 7.8 07/02/2018 1544   ALBUMIN 4.6 07/02/2018 1544   AST 22 07/02/2018 1544   ALT 29 07/02/2018 1544   ALKPHOS 119 (H) 07/02/2018 1544   BILITOT 0.4 07/02/2018 1544   BILIDIR 0.1 01/02/2012 1626   IBILI 0.3 01/05/2010 2038  Component Value Date/Time   TSH 2.36 07/02/2018 1544   TSH 1.26 06/08/2017 1457   TSH 0.93 06/15/2016 1359      OBESITY BEHAVIORAL INTERVENTION VISIT  Today's visit was # 2   Starting weight: 198 lbs Starting date: 08/30/2018 Today's weight : 197 lbs  Today's date: 09/13/2018 Total lbs lost to date: 1    09/13/2018  Height 5\' 1"  (1.549 m)  Weight 197 lb (89.4 kg)  BMI (Calculated) 37.24  BLOOD PRESSURE - SYSTOLIC 314  BLOOD PRESSURE - DIASTOLIC 69   Body Fat % 38.8 %  Total Body Water (lbs) 75.2 lbs     ASK: We discussed the diagnosis of obesity with Chaselyn Trollinger-Smith  today and Tinzlee agreed to give [name] permission to discuss obesity behavioral modification therapy today.  ASSESS: Palin has the diagnosis of obesity and her BMI today is 37.24 [place] is in the action stage of change   ADVISE: Briella was educated on the multiple health risks of obesity as well as the benefit of weight loss to improve her health. She was advised of the need for long term treatment and the importance of lifestyle modifications to improve her current health and to decrease her risk of future health problems.  AGREE: Multiple dietary modification options and treatment options were discussed and  Onesha agreed to follow the recommendations documented in the above note.  ARRANGE: Claryssa was educated on the importance of frequent visits to treat obesity as outlined per CMS and USPSTF guidelines and agreed to schedule her next follow up appointment today.  I, Trixie Dredge, am acting as transcriptionist for Ilene Qua, MD  I have reviewed the above documentation for accuracy and completeness, and I agree with the above. - Ilene Qua, MD

## 2018-09-22 MED FILL — ADVAIR 250/50 DISKUS: 250-50 | 60 days supply | Qty: 60 | Fill #5

## 2018-09-25 MED FILL — LOSARTAN-HCTZ 100-25 MG TAB: 100-25 | 30 days supply | Qty: 30 | Fill #2

## 2018-09-26 ENCOUNTER — Encounter (INDEPENDENT_AMBULATORY_CARE_PROVIDER_SITE_OTHER): Payer: Self-pay

## 2018-09-26 MED FILL — MELOXICAM 15 MG TABLET: 15 | 30 days supply | Qty: 30 | Fill #0

## 2018-09-30 ENCOUNTER — Telehealth: Payer: Self-pay | Admitting: Family Medicine

## 2018-09-30 DIAGNOSIS — E7800 Pure hypercholesterolemia, unspecified: Secondary | ICD-10-CM

## 2018-09-30 NOTE — Telephone Encounter (Signed)
-----   Message from Ellamae Sia sent at 09/28/2018  3:58 PM EDT ----- Regarding: Lab orders for Thursday, 4.2.20 Lab orders for a 3 month follow up appt.

## 2018-10-01 ENCOUNTER — Other Ambulatory Visit: Payer: Self-pay | Admitting: Internal Medicine

## 2018-10-01 ENCOUNTER — Telehealth: Payer: Self-pay

## 2018-10-01 MED FILL — VENTOLIN HFA 90 MCG INHALER: 108 (90 BAS | 25 days supply | Qty: 18 | Fill #0

## 2018-10-01 NOTE — Telephone Encounter (Signed)
Left VM to call clinic regarding appt.  Needs COVID screening and drive up lab instructions

## 2018-10-03 ENCOUNTER — Ambulatory Visit (INDEPENDENT_AMBULATORY_CARE_PROVIDER_SITE_OTHER): Payer: 59 | Admitting: Bariatrics

## 2018-10-03 ENCOUNTER — Other Ambulatory Visit: Payer: Self-pay

## 2018-10-03 ENCOUNTER — Encounter (INDEPENDENT_AMBULATORY_CARE_PROVIDER_SITE_OTHER): Payer: Self-pay | Admitting: Bariatrics

## 2018-10-03 DIAGNOSIS — Z6837 Body mass index (BMI) 37.0-37.9, adult: Secondary | ICD-10-CM | POA: Diagnosis not present

## 2018-10-03 DIAGNOSIS — I10 Essential (primary) hypertension: Secondary | ICD-10-CM

## 2018-10-03 DIAGNOSIS — E559 Vitamin D deficiency, unspecified: Secondary | ICD-10-CM

## 2018-10-03 DIAGNOSIS — R7303 Prediabetes: Secondary | ICD-10-CM | POA: Diagnosis not present

## 2018-10-03 MED ORDER — METFORMIN HCL 500 MG PO TABS: 500.0000 mg | ORAL_TABLET | Freq: Every day | ORAL | 0 refills | Status: DC

## 2018-10-03 MED ORDER — VITAMIN D (ERGOCALCIFEROL) 1.25 MG (50000 UNIT) PO CAPS: 50000.0000 [IU] | ORAL_CAPSULE | ORAL | 0 refills | Status: DC

## 2018-10-04 ENCOUNTER — Other Ambulatory Visit: Payer: Self-pay

## 2018-10-04 ENCOUNTER — Other Ambulatory Visit (INDEPENDENT_AMBULATORY_CARE_PROVIDER_SITE_OTHER): Payer: 59

## 2018-10-04 DIAGNOSIS — E7800 Pure hypercholesterolemia, unspecified: Secondary | ICD-10-CM | POA: Diagnosis not present

## 2018-10-04 NOTE — Progress Notes (Signed)
Office: [phone]  /  Fax: [phone] TeleHealth Visit:  Katherine Tanner has verbally consented to this TeleHealth visit today. The patient is located at home, the provider is located at the [facility] office. The participants in this visit include the listed provider and patient. The visit was conducted today via Face Time.  HPI:   Chief Complaint: OBESITY Katherine Tanner is here to discuss her progress with her obesity treatment plan. She is on the Category 2 plan + 100 calories and is following her eating plan approximately 100 % of the time. She states she is riding a bike, using weights, and doing aerobics 30 to 45 minutes 5 times per week. [name] is doing well and is down 2 pounds per her home scale. She has struggled with getting the bread. She has done some stress eating, but is not having any inappropriate hunger.  We were unable to weigh the patient today for this TeleHealth visit. She feels as if she has lost weight since her last visit. She has lost 1 lb since starting treatment with [name].  Vitamin D Deficiency Katherine Tanner has a diagnosis of vitamin D deficiency. She is currently taking high dose vitamin D and her level is 25.3 on 08/30/18. Katherine Tanner denies nausea, vomiting, or muscle weakness.  Pre-Diabetes Katherine Tanner has a diagnosis of pre-diabetes based on her elevated Hgb A1c of 5.9 and fasting Insulin of 9.3 on 08/30/18. She was informed this puts her at greater risk of developing diabetes. She is taking metformin currently and continues to work on diet and exercise to decrease risk of diabetes.   Hypertension Katherine Tanner is a 58 y.o. female with hypertension. Katherine Tanner taking losartan/HCTZ 100-25 mg and is well controlled. She has a blood pressure cuff and her reading was 119/87. She is working on weight loss to help control her blood pressure with the goal of decreasing her risk of heart attack and stroke.   ASSESSMENT AND PLAN:  Vitamin D deficiency -  Plan: Vitamin D, Ergocalciferol, (DRISDOL) 1.25 MG (50000 UT) CAPS capsule  Prediabetes - Plan: metFORMIN (GLUCOPHAGE) 500 MG tablet  Essential hypertension  Class 2 severe obesity with serious comorbidity and body mass index (BMI) of 37.0 to 37.9 in adult, unspecified obesity type (Katherine Tanner)  PLAN:  Vitamin D Deficiency Katherine Tanner was informed that low vitamin D levels contribute to fatigue and are associated with obesity, breast, and colon cancer. Katherine Tanner agrees to continue to take prescription Vit D @50 ,000 IU every week #4 with no refills and will follow up for routine testing of vitamin D, at least 2-3 times per year. She was informed of the risk of over-replacement of vitamin D and agrees to not increase her dose unless she discusses this with [name] first. Katherine Tanner agrees to follow up in 2 weeks as directed.  Pre-Diabetes Katherine Tanner will continue to work on weight loss, exercise, and decreasing simple carbohydrates in her diet to help decrease the risk of diabetes. She was informed that eating too many simple carbohydrates or too many calories at one sitting increases the likelihood of GI side effects. Katherine Tanner agreed to continue metformin 500 mg qd with breakfast #30 with no refills and a prescription was written today. Katherine Tanner agreed to follow up with [name] as directed to monitor her progress in 2 weeks.  Hypertension We discussed sodium restriction, working on healthy weight loss, and a regular exercise program as the means to achieve improved blood pressure control. We will continue to monitor her blood pressure as well as  her progress with the above lifestyle modifications. She will continue her medications as prescribed and will watch for signs of hypotension as she continues her lifestyle modifications. Katherine Tanner agreed with this plan and agreed to follow up as directed.   Obesity Katherine Tanner is currently in the action stage of change. As such, her goal is to continue with weight loss efforts. She has agreed to  follow the Category 2 plan + 100 calories with meal planning and to save snack calories at night. [name] has been instructed to continue the same exercises. We discussed the following Behavioral Modification Strategies today: increasing lean protein intake, decreasing simple carbohydrates, increasing vegetables, decrease eating out, increase H2O intake, no skipping meals, and work on meal planning and easy cooking plans.  Katherine Tanner has agreed to follow up with our clinic in 2 weeks. She was informed of the importance of frequent follow up visits to maximize her success with intensive lifestyle modifications for her multiple health conditions.  ALLERGIES: Allergies  Allergen Reactions  . Ace Inhibitors Shortness Of Breath and Cough    Cough/ breathing problems   . Amoxicillin-Pot Clavulanate Swelling    SWELLING REACTION UNSPECIFIED   . Influenza Virus Vacc Split Pf     UNSPECIFIED REACTION   . Shellfish Allergy Itching    MEDICATIONS: Current Outpatient Medications on File Prior to Visit  Medication Sig Dispense Refill  . benzonatate (TESSALON) 200 MG capsule Take 1 capsule (200 mg total) by mouth 3 (three) times daily as needed for cough. 30 capsule 1  . Calcium Citrate-Vitamin D (CALCIUM + D PO) Take 1 tablet by mouth daily.    [name] EPINEPHRINE 0.3 mg/0.3 mL IJ SOAJ injection INJECT 1 SYRINGE INTO THE MUSCLE ONCE 1 Device 12  . fluticasone (FLONASE) 50 MCG/ACT nasal spray Place 2 sprays into both nostrils daily. 48 g 3  . Fluticasone-Salmeterol (ADVAIR) 250-50 MCG/DOSE AEPB Inhale 1 puff, then rinse mouth, once daily 60 each 11  . guaiFENesin (MUCINEX) 600 MG 12 hr tablet Take 600 mg by mouth 2 (two) times daily as needed.     [name] ipratropium-albuterol (DUONEB) 0.5-2.5 (3) MG/3ML SOLN USE 1 VIAL VIA NEBULIZER EVERY 6 HOURS AS NEEDED 90 mL 12  . loratadine (CLARITIN) 10 MG tablet Take 10 mg by mouth daily.     [name] losartan-hydrochlorothiazide (HYZAAR) 100-25 MG tablet Take 1 tablet by mouth  daily. 90 tablet 3  . montelukast (SINGULAIR) 10 MG tablet TAKE 1 TABLET BY MOUTH AT BEDTIME. 90 tablet 3  . Multiple Vitamin (MULTIVITAMIN PO) Take 1 tablet by mouth daily.     . nabumetone (RELAFEN) 750 MG tablet Take 750 mg by mouth 2 (two) times daily as needed. Patient stated she only will take 1 tablet a day as needed    . Nebulizers (COMPRESSOR/NEBULIZER) MISC Use as directed 1 each 0  . promethazine-codeine (PHENERGAN WITH CODEINE) 6.25-10 MG/5ML syrup 5 ml every 6 hours if needed for cough 200 mL 0  . VENTOLIN HFA 108 (90 Base) MCG/ACT inhaler INHALE 2 PUFFS BY MOUTH INTO THE LUNGS EVERY 6 HOURS AS NEEDED FOR WHEEZING OR SHORTNESS OF BREATH. 18 g 12   No current facility-administered medications on file prior to visit.     PAST MEDICAL HISTORY: Past Medical History:  Diagnosis Date  . Allergy    takes Claritin daily and Flonase daily  . Arthritis   . Asthma    uses Albuterol daily as needed;Singulair nightly;DUlera daily  . Back pain   . Constipation   .  Fatty liver   . Food allergy   . GERD (gastroesophageal reflux disease)    occasionally will take OTC meds but states she just watches what she eats  . H/O hiatal hernia   . History of bronchitis    last time 47yrs ago  . History of colon polyps   . History of kidney stones   . History of shingles   . Hyperlipidemia   . Hypertension    takes Hyzaar daily  . Joint pain   . Joint pain   . Joint swelling   . Obesity   . Osteoarthritis   . Pneumonia    hx of;last time at least 60yrs ago  . PONV (postoperative nausea and vomiting)   . Prediabetes   . Shortness of breath   . Sleep apnea   . Swelling of lower extremity   . Trouble in sleeping   . Wheezing     PAST SURGICAL HISTORY: Past Surgical History:  Procedure Laterality Date  . BREAST EXCISIONAL BIOPSY Right   . CARDIAC CATHETERIZATION    . COLONOSCOPY    . ESOPHAGOGASTRODUODENOSCOPY    . KNEE SURGERY Right    arthroscopy  . Right ganglion cyst     x  2  . right lumpectomy  around 1983  . STERIOD INJECTION Left 11/15/2013   Procedure: Marcaine/STEROID INJECTION;  Surgeon: Alta Corning, MD;  Location: [place];  Service: Orthopedics;  Laterality: Left;  . TOTAL KNEE ARTHROPLASTY Right 11/15/2013   Procedure: RIGHT TOTAL KNEE ARTHROPLASTY;  Surgeon: Alta Corning, MD;  Location: [place];  Service: Orthopedics;  Laterality: Right;  . TOTAL KNEE ARTHROPLASTY Left 12/30/2016   Procedure: TOTAL KNEE ARTHROPLASTY;  Surgeon: Dorna Leitz, MD;  Location: [facility];  Service: Orthopedics;  Laterality: Left;  . TUBAL LIGATION      SOCIAL HISTORY: Social History   Tobacco Use  . Smoking status: Never Smoker  . Smokeless tobacco: Never Used  Substance Use Topics  . Alcohol use: No    Alcohol/week: 0.0 standard drinks  . Drug use: No    FAMILY HISTORY: Family History  Problem Relation Age of Onset  . Diabetes Father   . Cancer Father   . Liver disease Father   . Alcoholism Father   . Sarcoidosis Mother   . Glaucoma Mother   . Hypertension Mother   . Hyperlipidemia Mother   . Sleep apnea Mother   . Obesity Mother   . Breast cancer Neg Hx     ROS: Review of Systems  Gastrointestinal: Negative for nausea and vomiting.  Musculoskeletal:       Negative for muscle weakness.    PHYSICAL EXAM: Pt in no acute distress  RECENT LABS AND TESTS: BMET    Component Value Date/Time   NA 140 07/02/2018 1544   K 3.7 07/02/2018 1544   CL 100 07/02/2018 1544   CO2 30 07/02/2018 1544   GLUCOSE 80 07/02/2018 1544   BUN 17 07/02/2018 1544   CREATININE 0.72 07/02/2018 1544   CALCIUM 10.4 07/02/2018 1544   GFRNONAA >60 07/07/2017 1207   GFRAA >60 07/07/2017 1207   Lab Results  Component Value Date   HGBA1C 5.9 (H) 08/30/2018   HGBA1C 6.1 06/08/2017   HGBA1C 6.1 06/15/2016   HGBA1C 6.1 06/05/2015   HGBA1C 6.0 11/17/2014   Lab Results  Component Value Date   INSULIN 9.3 08/30/2018   CBC    Component Value Date/Time   WBC 9.8 07/02/2018  1544  RBC 4.76 07/02/2018 1544   HGB 13.7 07/02/2018 1544   HCT 41.8 07/02/2018 1544   PLT 125.0 (L) 07/02/2018 1544   MCV 87.9 07/02/2018 1544   MCH 28.8 07/07/2017 1207   MCHC 32.8 07/02/2018 1544   RDW 14.0 07/02/2018 1544   LYMPHSABS 2.7 07/02/2018 1544   MONOABS 1.0 07/02/2018 1544   EOSABS 0.2 07/02/2018 1544   BASOSABS 0.1 07/02/2018 1544   Iron/TIBC/Ferritin/ %Sat No results found for: IRON, TIBC, FERRITIN, IRONPCTSAT Lipid Panel     Component Value Date/Time   CHOL 209 (H) 07/02/2018 1544   TRIG 97.0 07/02/2018 1544   HDL 61.40 07/02/2018 1544   CHOLHDL 3 07/02/2018 1544   VLDL 19.4 07/02/2018 1544   LDLCALC 128 (H) 07/02/2018 1544   LDLDIRECT 147.9 12/17/2012 1604   Hepatic Function Panel     Component Value Date/Time   PROT 7.8 07/02/2018 1544   ALBUMIN 4.6 07/02/2018 1544   AST 22 07/02/2018 1544   ALT 29 07/02/2018 1544   ALKPHOS 119 (H) 07/02/2018 1544   BILITOT 0.4 07/02/2018 1544   BILIDIR 0.1 01/02/2012 1626   IBILI 0.3 01/05/2010 2038      Component Value Date/Time   TSH 2.36 07/02/2018 1544   TSH 1.26 06/08/2017 1457   TSH 0.93 06/15/2016 1359   Results for JOVONNE, WILTON (MRN [id number]) as of 10/04/2018 08:45  Ref. Range 08/30/2018 12:52  Vitamin D, 25-Hydroxy Latest Ref Range: 30.0 - 100.0 ng/mL 25.3 (L)    I, Marcille Blanco, CMA, am acting as Location manager for [employer], DO  I have reviewed the above documentation for accuracy and completeness, and I agree with the above. -Jearld Lesch, DO

## 2018-10-05 LAB — LIPID PANEL
Cholesterol: 194 mg/dL (ref 0–200)
HDL: 51 mg/dL (ref >39.00)
LDL Cholesterol: 129 mg/dL — ABNORMAL HIGH (ref 0–99)
NonHDL: 142.66
Total CHOL/HDL Ratio: 4
Triglycerides: 69 mg/dL (ref 0.0–149.0)
VLDL: 13.8 mg/dL (ref 0.0–40.0)

## 2018-10-09 MED FILL — MONTELUKAST SOD 10 MG TAB: 10 | 90 days supply | Qty: 90 | Fill #1

## 2018-10-09 MED FILL — metFORMIN HCL 500 MG TABS: 500 | 30 days supply | Qty: 30 | Fill #0

## 2018-10-09 MED FILL — VIT D2 1.25 MG (50,000 UNIT: 1.25 MG | 28 days supply | Qty: 4 | Fill #0

## 2018-10-17 ENCOUNTER — Ambulatory Visit (INDEPENDENT_AMBULATORY_CARE_PROVIDER_SITE_OTHER): Payer: 59 | Admitting: Bariatrics

## 2018-10-17 ENCOUNTER — Encounter (INDEPENDENT_AMBULATORY_CARE_PROVIDER_SITE_OTHER): Payer: Self-pay | Admitting: Bariatrics

## 2018-10-17 ENCOUNTER — Other Ambulatory Visit: Payer: Self-pay

## 2018-10-17 DIAGNOSIS — R7303 Prediabetes: Secondary | ICD-10-CM

## 2018-10-17 DIAGNOSIS — Z6837 Body mass index (BMI) 37.0-37.9, adult: Secondary | ICD-10-CM | POA: Diagnosis not present

## 2018-10-17 DIAGNOSIS — I10 Essential (primary) hypertension: Secondary | ICD-10-CM

## 2018-10-17 MED ORDER — METFORMIN HCL 500 MG PO TABS: 500.0000 mg | ORAL_TABLET | Freq: Two times a day (BID) | ORAL | 0 refills | Status: DC

## 2018-10-17 NOTE — Progress Notes (Signed)
Office: [phone]  /  Fax: [phone] TeleHealth Visit:  Kyllie Pettijohn has verbally consented to this TeleHealth visit today. The patient is located at home, the provider is located at the [facility] office. The participants in this visit include the listed provider and patient and any and all parties involved. The visit was conducted today via FaceTime.  HPI:   Chief Complaint: OBESITY Lajuanda is here to discuss her progress with her obesity treatment plan. She is on the Category 2 plan +100 calories and is following her eating plan approximately 100 % of the time. She states she is doing cardio exercise, resistance bands and biking for 20 to 30 minutes 5 to 6 times per week. Braniyah states that she has lost 2 pounds. Areej is surviving the stress and she is not stress eating. We were unable to weigh the patient today for this TeleHealth visit. She feels as if she has lost weight since her last visit. She has lost 3 lbs since starting treatment with [name].  Hypertension Konya Fauble is a 58 y.o. female with hypertension. Her blood pressure at home was at 122/75. Aneri Trollinger-Smith denies chest pain or shortness of breath on exertion. She is working weight loss to help control her blood pressure with the goal of decreasing her risk of heart attack and stroke. Shelias blood pressure is currently controlled.  Pre-Diabetes Shaquan has a diagnosis of prediabetes based on her elevated Hgb A1c and was informed this puts her at greater risk of developing diabetes. Her last A1c was at 5.9 and last insulin level was at 9.3 She is taking metformin currently and continues to work on diet and exercise to decrease risk of diabetes. She denies nausea or hypoglycemia.  ASSESSMENT AND PLAN:  Essential hypertension  Prediabetes - Plan: metFORMIN (GLUCOPHAGE) 500 MG tablet  Class 2 severe obesity with serious comorbidity and body mass index (BMI) of 37.0 to 37.9 in  adult, unspecified obesity type ([place])  PLAN:  Hypertension We discussed sodium restriction, working on healthy weight loss, and a regular exercise program as the means to achieve improved blood pressure control. Jarika agreed with this plan and agreed to follow up as directed. We will continue to monitor her blood pressure as well as her progress with the above lifestyle modifications. She will continue her medications as prescribed and she will take her blood pressure at home and record.  Pre-Diabetes Kaylyne will continue to work on weight loss, exercise, and decreasing simple carbohydrates in her diet to help decrease the risk of diabetes. We dicussed metformin including benefits and risks. She was informed that eating too many simple carbohydrates or too many calories at one sitting increases the likelihood of GI side effects. Letti agreed to increase metformin to 500 mg BID #60 with no refills and follow up with [name] as directed to monitor her progress.  Obesity Kenady is currently in the action stage of change. As such, her goal is to continue with weight loss efforts She has agreed to follow the Category 2 plan Sheika will continue exercise for weight loss and overall health benefits. We discussed the following Behavioral Modification Strategies today: increase H2O intake, no skipping meals, keeping healthy foods in the home, increasing lean protein intake, decreasing simple carbohydrates, increasing vegetables, decrease eating out and work on meal planning and easy cooking plans Orlene will weigh herself at home before each visit. We discussed options for protein today.  Ophie has agreed to follow up with our clinic  in 2 weeks. She was informed of the importance of frequent follow up visits to maximize her success with intensive lifestyle modifications for her multiple health conditions.  ALLERGIES: Allergies  Allergen Reactions  . Ace Inhibitors Shortness Of Breath and Cough    Cough/  breathing problems   . Amoxicillin-Pot Clavulanate Swelling    SWELLING REACTION UNSPECIFIED   . Influenza Virus Vacc Split Pf     UNSPECIFIED REACTION   . Shellfish Allergy Itching    MEDICATIONS: Current Outpatient Medications on File Prior to Visit  Medication Sig Dispense Refill  . benzonatate (TESSALON) 200 MG capsule Take 1 capsule (200 mg total) by mouth 3 (three) times daily as needed for cough. 30 capsule 1  . Calcium Citrate-Vitamin D (CALCIUM + D PO) Take 1 tablet by mouth daily.    [name] EPINEPHRINE 0.3 mg/0.3 mL IJ SOAJ injection INJECT 1 SYRINGE INTO THE MUSCLE ONCE 1 Device 12  . fluticasone (FLONASE) 50 MCG/ACT nasal spray Place 2 sprays into both nostrils daily. 48 g 3  . Fluticasone-Salmeterol (ADVAIR) 250-50 MCG/DOSE AEPB Inhale 1 puff, then rinse mouth, once daily 60 each 11  . guaiFENesin (MUCINEX) 600 MG 12 hr tablet Take 600 mg by mouth 2 (two) times daily as needed.     [name] ipratropium-albuterol (DUONEB) 0.5-2.5 (3) MG/3ML SOLN USE 1 VIAL VIA NEBULIZER EVERY 6 HOURS AS NEEDED 90 mL 12  . loratadine (CLARITIN) 10 MG tablet Take 10 mg by mouth daily.     [name] losartan-hydrochlorothiazide (HYZAAR) 100-25 MG tablet Take 1 tablet by mouth daily. 90 tablet 3  . montelukast (SINGULAIR) 10 MG tablet TAKE 1 TABLET BY MOUTH AT BEDTIME. 90 tablet 3  . Multiple Vitamin (MULTIVITAMIN PO) Take 1 tablet by mouth daily.     . nabumetone (RELAFEN) 750 MG tablet Take 750 mg by mouth 2 (two) times daily as needed. Patient stated she only will take 1 tablet a day as needed    . Nebulizers (COMPRESSOR/NEBULIZER) MISC Use as directed 1 each 0  . promethazine-codeine (PHENERGAN WITH CODEINE) 6.25-10 MG/5ML syrup 5 ml every 6 hours if needed for cough 200 mL 0  . VENTOLIN HFA 108 (90 Base) MCG/ACT inhaler INHALE 2 PUFFS BY MOUTH INTO THE LUNGS EVERY 6 HOURS AS NEEDED FOR WHEEZING OR SHORTNESS OF BREATH. 18 g 12  . Vitamin D, Ergocalciferol, (DRISDOL) 1.25 MG (50000 UT) CAPS capsule Take 1 capsule  (50,000 Units total) by mouth every 7 (seven) days. 4 capsule 0   No current facility-administered medications on file prior to visit.     PAST MEDICAL HISTORY: Past Medical History:  Diagnosis Date  . Allergy    takes Claritin daily and Flonase daily  . Arthritis   . Asthma    uses Albuterol daily as needed;Singulair nightly;DUlera daily  . Back pain   . Constipation   . Fatty liver   . Food allergy   . GERD (gastroesophageal reflux disease)    occasionally will take OTC meds but states she just watches what she eats  . H/O hiatal hernia   . History of bronchitis    last time 6yrs ago  . History of colon polyps   . History of kidney stones   . History of shingles   . Hyperlipidemia   . Hypertension    takes Hyzaar daily  . Joint pain   . Joint pain   . Joint swelling   . Obesity   . Osteoarthritis   . Pneumonia  hx of;last time at least 55yrs ago  . PONV (postoperative nausea and vomiting)   . Prediabetes   . Shortness of breath   . Sleep apnea   . Swelling of lower extremity   . Trouble in sleeping   . Wheezing     PAST SURGICAL HISTORY: Past Surgical History:  Procedure Laterality Date  . BREAST EXCISIONAL BIOPSY Right   . CARDIAC CATHETERIZATION    . COLONOSCOPY    . ESOPHAGOGASTRODUODENOSCOPY    . KNEE SURGERY Right    arthroscopy  . Right ganglion cyst     x 2  . right lumpectomy  around 1983  . STERIOD INJECTION Left 11/15/2013   Procedure: Marcaine/STEROID INJECTION;  Surgeon: Alta Corning, MD;  Location: [place];  Service: Orthopedics;  Laterality: Left;  . TOTAL KNEE ARTHROPLASTY Right 11/15/2013   Procedure: RIGHT TOTAL KNEE ARTHROPLASTY;  Surgeon: Alta Corning, MD;  Location: [place];  Service: Orthopedics;  Laterality: Right;  . TOTAL KNEE ARTHROPLASTY Left 12/30/2016   Procedure: TOTAL KNEE ARTHROPLASTY;  Surgeon: Dorna Leitz, MD;  Location: [place];  Service: Orthopedics;  Laterality: Left;  . TUBAL LIGATION      SOCIAL HISTORY: Social  History   Tobacco Use  . Smoking status: Never Smoker  . Smokeless tobacco: Never Used  Substance Use Topics  . Alcohol use: No    Alcohol/week: 0.0 standard drinks  . Drug use: No    FAMILY HISTORY: Family History  Problem Relation Age of Onset  . Diabetes Father   . Cancer Father   . Liver disease Father   . Alcoholism Father   . Sarcoidosis Mother   . Glaucoma Mother   . Hypertension Mother   . Hyperlipidemia Mother   . Sleep apnea Mother   . Obesity Mother   . Breast cancer Neg Hx     ROS: Review of Systems  Constitutional: Positive for weight loss.  Respiratory: Negative for shortness of breath (on exertion).   Cardiovascular: Negative for chest pain.  Gastrointestinal: Negative for nausea.  Endo/Heme/Allergies:       Negative for hypoglycemia    PHYSICAL EXAM: Pt in no acute distress  RECENT LABS AND TESTS: BMET    Component Value Date/Time   NA 140 07/02/2018 1544   K 3.7 07/02/2018 1544   CL 100 07/02/2018 1544   CO2 30 07/02/2018 1544   GLUCOSE 80 07/02/2018 1544   BUN 17 07/02/2018 1544   CREATININE 0.72 07/02/2018 1544   CALCIUM 10.4 07/02/2018 1544   GFRNONAA >60 07/07/2017 1207   GFRAA >60 07/07/2017 1207   Lab Results  Component Value Date   HGBA1C 5.9 (H) 08/30/2018   HGBA1C 6.1 06/08/2017   HGBA1C 6.1 06/15/2016   HGBA1C 6.1 06/05/2015   HGBA1C 6.0 11/17/2014   Lab Results  Component Value Date   INSULIN 9.3 08/30/2018   CBC    Component Value Date/Time   WBC 9.8 07/02/2018 1544   RBC 4.76 07/02/2018 1544   HGB 13.7 07/02/2018 1544   HCT 41.8 07/02/2018 1544   PLT 125.0 (L) 07/02/2018 1544   MCV 87.9 07/02/2018 1544   MCH 28.8 07/07/2017 1207   MCHC 32.8 07/02/2018 1544   RDW 14.0 07/02/2018 1544   LYMPHSABS 2.7 07/02/2018 1544   MONOABS 1.0 07/02/2018 1544   EOSABS 0.2 07/02/2018 1544   BASOSABS 0.1 07/02/2018 1544   Iron/TIBC/Ferritin/ %Sat No results found for: IRON, TIBC, FERRITIN, IRONPCTSAT Lipid Panel      Component  Value Date/Time   CHOL 194 10/04/2018 1457   TRIG 69.0 10/04/2018 1457   HDL 51.00 10/04/2018 1457   CHOLHDL 4 10/04/2018 1457   VLDL 13.8 10/04/2018 1457   LDLCALC 129 (H) 10/04/2018 1457   LDLDIRECT 147.9 12/17/2012 1604   Hepatic Function Panel     Component Value Date/Time   PROT 7.8 07/02/2018 1544   ALBUMIN 4.6 07/02/2018 1544   AST 22 07/02/2018 1544   ALT 29 07/02/2018 1544   ALKPHOS 119 (H) 07/02/2018 1544   BILITOT 0.4 07/02/2018 1544   BILIDIR 0.1 01/02/2012 1626   IBILI 0.3 01/05/2010 2038      Component Value Date/Time   TSH 2.36 07/02/2018 1544   TSH 1.26 06/08/2017 1457   TSH 0.93 06/15/2016 1359    Results for LOGEN, FOWLE (MRN [id number]) as of 10/17/2018 21:39  Ref. Range 08/30/2018 12:52  Vitamin D, 25-Hydroxy Latest Ref Range: 30.0 - 100.0 ng/mL 25.3 (L)    I, Doreene Nest, am acting as Location manager for [employer]. Owens Shark, DO   I have reviewed the above documentation for accuracy and completeness, and I agree with the above. -Jearld Lesch, DO

## 2018-10-23 MED FILL — LOSARTAN-HCTZ 100-25 MG TAB: 100-25 | 30 days supply | Qty: 30 | Fill #3

## 2018-10-24 MED FILL — metFORMIN HCL 500 MG TABS: 500 | 30 days supply | Qty: 60 | Fill #0

## 2018-10-31 ENCOUNTER — Encounter (INDEPENDENT_AMBULATORY_CARE_PROVIDER_SITE_OTHER): Payer: Self-pay | Admitting: Bariatrics

## 2018-10-31 ENCOUNTER — Ambulatory Visit (INDEPENDENT_AMBULATORY_CARE_PROVIDER_SITE_OTHER): Payer: 59 | Admitting: Bariatrics

## 2018-10-31 ENCOUNTER — Other Ambulatory Visit: Payer: Self-pay

## 2018-10-31 DIAGNOSIS — E559 Vitamin D deficiency, unspecified: Secondary | ICD-10-CM | POA: Diagnosis not present

## 2018-10-31 DIAGNOSIS — Z6837 Body mass index (BMI) 37.0-37.9, adult: Secondary | ICD-10-CM

## 2018-10-31 DIAGNOSIS — I10 Essential (primary) hypertension: Secondary | ICD-10-CM

## 2018-10-31 MED ORDER — VITAMIN D (ERGOCALCIFEROL) 1.25 MG (50000 UNIT) PO CAPS: 50000.0000 [IU] | ORAL_CAPSULE | ORAL | 0 refills | Status: DC

## 2018-10-31 MED FILL — VIT D2 1.25 MG (50,000 UNIT: 1.25 MG | 28 days supply | Qty: 4 | Fill #0

## 2018-11-01 NOTE — Progress Notes (Signed)
Office: [phone]  /  Fax: [phone] TeleHealth Visit:  Katherine Tanner has verbally consented to this TeleHealth visit today. The patient is located at home, the provider is located at the [facility] office. The participants in this visit include the listed provider and patient and any and all parties involved. The visit was conducted today via FaceTime.  HPI:   Chief Complaint: OBESITY Katherine Tanner is here to discuss her progress with her obesity treatment plan. She is on the Category 2 plan and is following her eating plan approximately 100 % of the time. She states she is walking 30 to 45 minutes 7 times per week. [name] states that she has lost 2 pounds (weight 191 lbs). She states that her hours have been cut. She is not stress eating. Katherine Tanner is drinking water. We were unable to weigh the patient today for this TeleHealth visit. She feels as if she has lost weight since her last visit. She has lost 7 lbs since starting treatment with [name].  Vitamin D deficiency Arlene has a diagnosis of vitamin D deficiency. She is currently taking vit D and denies nausea, vomiting or muscle weakness.  Hypertension Katherine Tanner is a 58 y.o. female with hypertension. She is taking Hyzaar. Her blood pressure is 122/69. Katherine Tanner denies chest pain or shortness of breath on exertion. She is working weight loss to help control her blood pressure with the goal of decreasing her risk of heart attack and stroke. Shelias blood pressure is currently controlled.  ASSESSMENT AND PLAN:  Vitamin D deficiency - Plan: Vitamin D, Ergocalciferol, (DRISDOL) 1.25 MG (50000 UT) CAPS capsule  Essential hypertension  Class 2 severe obesity with serious comorbidity and body mass index (BMI) of 37.0 to 37.9 in adult, unspecified obesity type ([place])  PLAN:  Vitamin D Deficiency [name] was informed that low vitamin D levels contributes to fatigue and are associated with  obesity, breast, and colon cancer. She agrees to continue prescription Vit D @50 ,000 IU every week #4 with no refills and will follow up for routine testing of vitamin D, at least 2-3 times per year. She was informed of the risk of over-replacement of vitamin D and agrees to not increase her dose unless she discusses this with [name] first. [name] agreed to follow up as directed.  Hypertension We discussed sodium restriction, working on healthy weight loss, and a regular exercise program as the means to achieve improved blood pressure control. Rocquel agreed with this plan and agreed to follow up as directed. We will continue to monitor her blood pressure as well as her progress with the above lifestyle modifications. She will continue her medications as prescribed and will watch for signs of hypotension as she continues her lifestyle modifications.  Obesity Katherine Tanner is currently in the action stage of change. As such, her goal is to continue with weight loss efforts She has agreed to follow the Category 2 plan [name] will continue walking and light cardio exercise for weight loss and overall health benefits. We discussed the following Behavioral Modification Strategies today: increase H2O intake, no skipping meals, keeping healthy foods in the home, increasing lean protein intake, choices for protein, decreasing simple carbohydrates, increasing vegetables, decrease eating out and work on meal planning and easy cooking plans [name] will weigh herself at home before each visit.   Katherine Tanner has agreed to follow up with our clinic in 2 weeks. She was informed of the importance of frequent follow up visits to maximize her success with  intensive lifestyle modifications for her multiple health conditions.  ALLERGIES: Allergies  Allergen Reactions  . Ace Inhibitors Shortness Of Breath and Cough    Cough/ breathing problems   . Amoxicillin-Pot Clavulanate Swelling    SWELLING REACTION UNSPECIFIED   . Influenza  Virus Vacc Split Pf     UNSPECIFIED REACTION   . Shellfish Allergy Itching    MEDICATIONS: Current Outpatient Medications on File Prior to Visit  Medication Sig Dispense Refill  . benzonatate (TESSALON) 200 MG capsule Take 1 capsule (200 mg total) by mouth 3 (three) times daily as needed for cough. 30 capsule 1  . Calcium Citrate-Vitamin D (CALCIUM + D PO) Take 1 tablet by mouth daily.    [name] EPINEPHRINE 0.3 mg/0.3 mL IJ SOAJ injection INJECT 1 SYRINGE INTO THE MUSCLE ONCE 1 Device 12  . fluticasone (FLONASE) 50 MCG/ACT nasal spray Place 2 sprays into both nostrils daily. 48 g 3  . Fluticasone-Salmeterol (ADVAIR) 250-50 MCG/DOSE AEPB Inhale 1 puff, then rinse mouth, once daily 60 each 11  . guaiFENesin (MUCINEX) 600 MG 12 hr tablet Take 600 mg by mouth 2 (two) times daily as needed.     [name] ipratropium-albuterol (DUONEB) 0.5-2.5 (3) MG/3ML SOLN USE 1 VIAL VIA NEBULIZER EVERY 6 HOURS AS NEEDED 90 mL 12  . loratadine (CLARITIN) 10 MG tablet Take 10 mg by mouth daily.     [name] losartan-hydrochlorothiazide (HYZAAR) 100-25 MG tablet Take 1 tablet by mouth daily. 90 tablet 3  . metFORMIN (GLUCOPHAGE) 500 MG tablet Take 1 tablet (500 mg total) by mouth 2 (two) times daily with a meal. 60 tablet 0  . montelukast (SINGULAIR) 10 MG tablet TAKE 1 TABLET BY MOUTH AT BEDTIME. 90 tablet 3  . Multiple Vitamin (MULTIVITAMIN PO) Take 1 tablet by mouth daily.     . nabumetone (RELAFEN) 750 MG tablet Take 750 mg by mouth 2 (two) times daily as needed. Patient stated she only will take 1 tablet a day as needed    . Nebulizers (COMPRESSOR/NEBULIZER) MISC Use as directed 1 each 0  . promethazine-codeine (PHENERGAN WITH CODEINE) 6.25-10 MG/5ML syrup 5 ml every 6 hours if needed for cough 200 mL 0  . VENTOLIN HFA 108 (90 Base) MCG/ACT inhaler INHALE 2 PUFFS BY MOUTH INTO THE LUNGS EVERY 6 HOURS AS NEEDED FOR WHEEZING OR SHORTNESS OF BREATH. 18 g 12   No current facility-administered medications on file prior to visit.      PAST MEDICAL HISTORY: Past Medical History:  Diagnosis Date  . Allergy    takes Claritin daily and Flonase daily  . Arthritis   . Asthma    uses Albuterol daily as needed;Singulair nightly;DUlera daily  . Back pain   . Constipation   . Fatty liver   . Food allergy   . GERD (gastroesophageal reflux disease)    occasionally will take OTC meds but states she just watches what she eats  . H/O hiatal hernia   . History of bronchitis    last time 34yrs ago  . History of colon polyps   . History of kidney stones   . History of shingles   . Hyperlipidemia   . Hypertension    takes Hyzaar daily  . Joint pain   . Joint pain   . Joint swelling   . Obesity   . Osteoarthritis   . Pneumonia    hx of;last time at least 13yrs ago  . PONV (postoperative nausea and vomiting)   . Prediabetes   .  Shortness of breath   . Sleep apnea   . Swelling of lower extremity   . Trouble in sleeping   . Wheezing     PAST SURGICAL HISTORY: Past Surgical History:  Procedure Laterality Date  . BREAST EXCISIONAL BIOPSY Right   . CARDIAC CATHETERIZATION    . COLONOSCOPY    . ESOPHAGOGASTRODUODENOSCOPY    . KNEE SURGERY Right    arthroscopy  . Right ganglion cyst     x 2  . right lumpectomy  around 1983  . STERIOD INJECTION Left 11/15/2013   Procedure: Marcaine/STEROID INJECTION;  Surgeon: Alta Corning, MD;  Location: [place];  Service: Orthopedics;  Laterality: Left;  . TOTAL KNEE ARTHROPLASTY Right 11/15/2013   Procedure: RIGHT TOTAL KNEE ARTHROPLASTY;  Surgeon: Alta Corning, MD;  Location: [place];  Service: Orthopedics;  Laterality: Right;  . TOTAL KNEE ARTHROPLASTY Left 12/30/2016   Procedure: TOTAL KNEE ARTHROPLASTY;  Surgeon: Dorna Leitz, MD;  Location: [place];  Service: Orthopedics;  Laterality: Left;  . TUBAL LIGATION      SOCIAL HISTORY: Social History   Tobacco Use  . Smoking status: Never Smoker  . Smokeless tobacco: Never Used  Substance Use Topics  . Alcohol use: No     Alcohol/week: 0.0 standard drinks  . Drug use: No    FAMILY HISTORY: Family History  Problem Relation Age of Onset  . Diabetes Father   . Cancer Father   . Liver disease Father   . Alcoholism Father   . Sarcoidosis Mother   . Glaucoma Mother   . Hypertension Mother   . Hyperlipidemia Mother   . Sleep apnea Mother   . Obesity Mother   . Breast cancer Neg Hx     ROS: Review of Systems  Constitutional: Positive for weight loss.  Respiratory: Negative for shortness of breath (on exertion).   Cardiovascular: Negative for chest pain.  Gastrointestinal: Negative for nausea and vomiting.  Musculoskeletal:       Negative for muscle weakness    PHYSICAL EXAM: Pt in no acute distress  RECENT LABS AND TESTS: BMET    Component Value Date/Time   NA 140 07/02/2018 1544   K 3.7 07/02/2018 1544   CL 100 07/02/2018 1544   CO2 30 07/02/2018 1544   GLUCOSE 80 07/02/2018 1544   BUN 17 07/02/2018 1544   CREATININE 0.72 07/02/2018 1544   CALCIUM 10.4 07/02/2018 1544   GFRNONAA >60 07/07/2017 1207   GFRAA >60 07/07/2017 1207   Lab Results  Component Value Date   HGBA1C 5.9 (H) 08/30/2018   HGBA1C 6.1 06/08/2017   HGBA1C 6.1 06/15/2016   HGBA1C 6.1 06/05/2015   HGBA1C 6.0 11/17/2014   Lab Results  Component Value Date   INSULIN 9.3 08/30/2018   CBC    Component Value Date/Time   WBC 9.8 07/02/2018 1544   RBC 4.76 07/02/2018 1544   HGB 13.7 07/02/2018 1544   HCT 41.8 07/02/2018 1544   PLT 125.0 (L) 07/02/2018 1544   MCV 87.9 07/02/2018 1544   MCH 28.8 07/07/2017 1207   MCHC 32.8 07/02/2018 1544   RDW 14.0 07/02/2018 1544   LYMPHSABS 2.7 07/02/2018 1544   MONOABS 1.0 07/02/2018 1544   EOSABS 0.2 07/02/2018 1544   BASOSABS 0.1 07/02/2018 1544   Iron/TIBC/Ferritin/ %Sat No results found for: IRON, TIBC, FERRITIN, IRONPCTSAT Lipid Panel     Component Value Date/Time   CHOL 194 10/04/2018 1457   TRIG 69.0 10/04/2018 1457   HDL 51.00 10/04/2018  1457   CHOLHDL 4  10/04/2018 1457   VLDL 13.8 10/04/2018 1457   LDLCALC 129 (H) 10/04/2018 1457   LDLDIRECT 147.9 12/17/2012 1604   Hepatic Function Panel     Component Value Date/Time   PROT 7.8 07/02/2018 1544   ALBUMIN 4.6 07/02/2018 1544   AST 22 07/02/2018 1544   ALT 29 07/02/2018 1544   ALKPHOS 119 (H) 07/02/2018 1544   BILITOT 0.4 07/02/2018 1544   BILIDIR 0.1 01/02/2012 1626   IBILI 0.3 01/05/2010 2038      Component Value Date/Time   TSH 2.36 07/02/2018 1544   TSH 1.26 06/08/2017 1457   TSH 0.93 06/15/2016 1359     Ref. Range 08/30/2018 12:52  Vitamin D, 25-Hydroxy Latest Ref Range: 30.0 - 100.0 ng/mL 25.3 (L)    I, Doreene Nest, am acting as Location manager for [employer]. Owens Shark, DO  I have reviewed the above documentation for accuracy and completeness, and I agree with the above. -Jearld Lesch, DO

## 2018-11-05 DIAGNOSIS — E559 Vitamin D deficiency, unspecified: Secondary | ICD-10-CM | POA: Insufficient documentation

## 2018-11-08 MED FILL — MELOXICAM 15 MG TABLET: 15 | 30 days supply | Qty: 30 | Fill #1

## 2018-11-14 ENCOUNTER — Other Ambulatory Visit: Payer: Self-pay

## 2018-11-14 ENCOUNTER — Ambulatory Visit (INDEPENDENT_AMBULATORY_CARE_PROVIDER_SITE_OTHER): Payer: 59 | Admitting: Bariatrics

## 2018-11-14 DIAGNOSIS — Z6837 Body mass index (BMI) 37.0-37.9, adult: Secondary | ICD-10-CM

## 2018-11-14 DIAGNOSIS — E559 Vitamin D deficiency, unspecified: Secondary | ICD-10-CM | POA: Diagnosis not present

## 2018-11-14 DIAGNOSIS — R7303 Prediabetes: Secondary | ICD-10-CM

## 2018-11-14 MED ORDER — METFORMIN HCL 500 MG PO TABS: 500.0000 mg | ORAL_TABLET | Freq: Two times a day (BID) | ORAL | 0 refills | Status: DC

## 2018-11-15 NOTE — Progress Notes (Signed)
Office: [phone]  /  Fax: [phone] TeleHealth Visit:  Katherine Tanner has verbally consented to this TeleHealth visit today. The patient is located at home, the provider is located at the [facility] office. The participants in this visit include the listed provider and patient and any and all parties involved. The visit was conducted today via FaceTime.  HPI:   Chief Complaint: OBESITY Katherine Tanner is here to discuss her progress with her obesity treatment plan. She is on the Category 2 plan and is following her eating plan approximately 98 % of the time. She states she is doing weights, cardio exercise, biking and walking for 30 to 40 minutes 5 times per week. Katherine Tanner states that she has lost 1 pound (weight 190 lbs). We were unable to weigh the patient today for this TeleHealth visit. She feels as if she has lost weight since her last visit. She has lost 8 lbs since starting treatment with [name].  Pre-Diabetes Katherine Tanner has a diagnosis of prediabetes based on her elevated Hgb A1c and was informed this puts her at greater risk of developing diabetes. Her last A1c was at 5.9 and last insulin level was at 9.3 Katherine Tanner is taking metformin currently (decreased appetite) and she continues to work on diet and exercise to decrease risk of diabetes. She denies nausea or hypoglycemia.  Vitamin D deficiency Katherine Tanner has a diagnosis of vitamin D deficiency. She is currently taking vit D and denies nausea, vomiting or muscle weakness.  ASSESSMENT AND PLAN:  Prediabetes - Plan: metFORMIN (GLUCOPHAGE) 500 MG tablet  Vitamin D deficiency  Class 2 severe obesity with serious comorbidity and body mass index (BMI) of 37.0 to 37.9 in adult, unspecified obesity type (Katherine Tanner)  PLAN:  Pre-Diabetes Katherine Tanner will continue to work on weight loss, exercise, and decreasing simple carbohydrates in her diet to help decrease the risk of diabetes. We dicussed metformin including benefits and risks. She  was informed that eating too many simple carbohydrates or too many calories at one sitting increases the likelihood of GI side effects. Katherine Tanner agrees to continue metformin 500 mg BID #60 with no refills and follow up with [name] as directed to monitor her progress.  Vitamin D Deficiency Katherine Tanner was informed that low vitamin D levels contributes to fatigue and are associated with obesity, breast, and colon cancer. She will continue to take prescription Vit D @50 ,000 IU every week and will follow up for routine testing of vitamin D, at least 2-3 times per year. She was informed of the risk of over-replacement of vitamin D and agrees to not increase her dose unless she discusses this with [name] first.  Obesity Katherine Tanner is currently in the action stage of change. As such, her goal is to continue with weight loss efforts She has agreed to follow the Category 2 plan Katherine Tanner will continue her exercise regimen and activities for weight loss and overall health benefits. We discussed the following Behavioral Modification Strategies today: increase H2O intake, no skipping meals, keeping healthy meals in the home, increasing lean protein intake, decreasing simple carbohydrates, increasing vegetables, decrease eating out and work on meal planning and easy cooking plans Katherine Tanner will weigh herself at home and record before each visit.  Katherine Tanner has agreed to follow up with our clinic in 2 weeks. She was informed of the importance of frequent follow up visits to maximize her success with intensive lifestyle modifications for her multiple health conditions.  ALLERGIES: Allergies  Allergen Reactions  . Ace Inhibitors Shortness Of  Breath and Cough    Cough/ breathing problems   . Amoxicillin-Pot Clavulanate Swelling    SWELLING REACTION UNSPECIFIED   . Influenza Virus Vacc Split Pf     UNSPECIFIED REACTION   . Shellfish Allergy Itching    MEDICATIONS: Current Outpatient Medications on File Prior to Visit  Medication Sig  Dispense Refill  . benzonatate (TESSALON) 200 MG capsule Take 1 capsule (200 mg total) by mouth 3 (three) times daily as needed for cough. 30 capsule 1  . Calcium Citrate-Vitamin D (CALCIUM + D PO) Take 1 tablet by mouth daily.    [name] EPINEPHRINE 0.3 mg/0.3 mL IJ SOAJ injection INJECT 1 SYRINGE INTO THE MUSCLE ONCE 1 Device 12  . fluticasone (FLONASE) 50 MCG/ACT nasal spray Place 2 sprays into both nostrils daily. 48 g 3  . Fluticasone-Salmeterol (ADVAIR) 250-50 MCG/DOSE AEPB Inhale 1 puff, then rinse mouth, once daily 60 each 11  . guaiFENesin (MUCINEX) 600 MG 12 hr tablet Take 600 mg by mouth 2 (two) times daily as needed.     [name] ipratropium-albuterol (DUONEB) 0.5-2.5 (3) MG/3ML SOLN USE 1 VIAL VIA NEBULIZER EVERY 6 HOURS AS NEEDED 90 mL 12  . loratadine (CLARITIN) 10 MG tablet Take 10 mg by mouth daily.     [name] losartan-hydrochlorothiazide (HYZAAR) 100-25 MG tablet Take 1 tablet by mouth daily. 90 tablet 3  . montelukast (SINGULAIR) 10 MG tablet TAKE 1 TABLET BY MOUTH AT BEDTIME. 90 tablet 3  . Multiple Vitamin (MULTIVITAMIN PO) Take 1 tablet by mouth daily.     . nabumetone (RELAFEN) 750 MG tablet Take 750 mg by mouth 2 (two) times daily as needed. Patient stated she only will take 1 tablet a day as needed    . Nebulizers (COMPRESSOR/NEBULIZER) MISC Use as directed 1 each 0  . promethazine-codeine (PHENERGAN WITH CODEINE) 6.25-10 MG/5ML syrup 5 ml every 6 hours if needed for cough 200 mL 0  . VENTOLIN HFA 108 (90 Base) MCG/ACT inhaler INHALE 2 PUFFS BY MOUTH INTO THE LUNGS EVERY 6 HOURS AS NEEDED FOR WHEEZING OR SHORTNESS OF BREATH. 18 g 12  . Vitamin D, Ergocalciferol, (DRISDOL) 1.25 MG (50000 UT) CAPS capsule Take 1 capsule (50,000 Units total) by mouth every 7 (seven) days. 4 capsule 0   No current facility-administered medications on file prior to visit.     PAST MEDICAL HISTORY: Past Medical History:  Diagnosis Date  . Allergy    takes Claritin daily and Flonase daily  . Arthritis   .  Asthma    uses Albuterol daily as needed;Singulair nightly;DUlera daily  . Back pain   . Constipation   . Fatty liver   . Food allergy   . GERD (gastroesophageal reflux disease)    occasionally will take OTC meds but states she just watches what she eats  . H/O hiatal hernia   . History of bronchitis    last time 24yrs ago  . History of colon polyps   . History of kidney stones   . History of shingles   . Hyperlipidemia   . Hypertension    takes Hyzaar daily  . Joint pain   . Joint pain   . Joint swelling   . Obesity   . Osteoarthritis   . Pneumonia    hx of;last time at least 37yrs ago  . PONV (postoperative nausea and vomiting)   . Prediabetes   . Shortness of breath   . Sleep apnea   . Swelling of lower extremity   .  Trouble in sleeping   . Wheezing     PAST SURGICAL HISTORY: Past Surgical History:  Procedure Laterality Date  . BREAST EXCISIONAL BIOPSY Right   . CARDIAC CATHETERIZATION    . COLONOSCOPY    . ESOPHAGOGASTRODUODENOSCOPY    . KNEE SURGERY Right    arthroscopy  . Right ganglion cyst     x 2  . right lumpectomy  around 1983  . STERIOD INJECTION Left 11/15/2013   Procedure: Marcaine/STEROID INJECTION;  Surgeon: Alta Corning, MD;  Location: [place];  Service: Orthopedics;  Laterality: Left;  . TOTAL KNEE ARTHROPLASTY Right 11/15/2013   Procedure: RIGHT TOTAL KNEE ARTHROPLASTY;  Surgeon: Alta Corning, MD;  Location: [place];  Service: Orthopedics;  Laterality: Right;  . TOTAL KNEE ARTHROPLASTY Left 12/30/2016   Procedure: TOTAL KNEE ARTHROPLASTY;  Surgeon: Dorna Leitz, MD;  Location: [place];  Service: Orthopedics;  Laterality: Left;  . TUBAL LIGATION      SOCIAL HISTORY: Social History   Tobacco Use  . Smoking status: Never Smoker  . Smokeless tobacco: Never Used  Substance Use Topics  . Alcohol use: No    Alcohol/week: 0.0 standard drinks  . Drug use: No    FAMILY HISTORY: Family History  Problem Relation Age of Onset  . Diabetes Father   .  Cancer Father   . Liver disease Father   . Alcoholism Father   . Sarcoidosis Mother   . Glaucoma Mother   . Hypertension Mother   . Hyperlipidemia Mother   . Sleep apnea Mother   . Obesity Mother   . Breast cancer Neg Hx     ROS: Review of Systems  Constitutional: Positive for weight loss.  Gastrointestinal: Negative for nausea and vomiting.  Musculoskeletal:       Negative for muscle weakness  Endo/Heme/Allergies:       Negative for hypoglycemia    PHYSICAL EXAM: Pt in no acute distress  RECENT LABS AND TESTS: BMET    Component Value Date/Time   NA 140 07/02/2018 1544   K 3.7 07/02/2018 1544   CL 100 07/02/2018 1544   CO2 30 07/02/2018 1544   GLUCOSE 80 07/02/2018 1544   BUN 17 07/02/2018 1544   CREATININE 0.72 07/02/2018 1544   CALCIUM 10.4 07/02/2018 1544   GFRNONAA >60 07/07/2017 1207   GFRAA >60 07/07/2017 1207   Lab Results  Component Value Date   HGBA1C 5.9 (H) 08/30/2018   HGBA1C 6.1 06/08/2017   HGBA1C 6.1 06/15/2016   HGBA1C 6.1 06/05/2015   HGBA1C 6.0 11/17/2014   Lab Results  Component Value Date   INSULIN 9.3 08/30/2018   CBC    Component Value Date/Time   WBC 9.8 07/02/2018 1544   RBC 4.76 07/02/2018 1544   HGB 13.7 07/02/2018 1544   HCT 41.8 07/02/2018 1544   PLT 125.0 (L) 07/02/2018 1544   MCV 87.9 07/02/2018 1544   MCH 28.8 07/07/2017 1207   MCHC 32.8 07/02/2018 1544   RDW 14.0 07/02/2018 1544   LYMPHSABS 2.7 07/02/2018 1544   MONOABS 1.0 07/02/2018 1544   EOSABS 0.2 07/02/2018 1544   BASOSABS 0.1 07/02/2018 1544   Iron/TIBC/Ferritin/ %Sat No results found for: IRON, TIBC, FERRITIN, IRONPCTSAT Lipid Panel     Component Value Date/Time   CHOL 194 10/04/2018 1457   TRIG 69.0 10/04/2018 1457   HDL 51.00 10/04/2018 1457   CHOLHDL 4 10/04/2018 1457   VLDL 13.8 10/04/2018 1457   LDLCALC 129 (H) 10/04/2018 1457   LDLDIRECT  147.9 12/17/2012 1604   Hepatic Function Panel     Component Value Date/Time   PROT 7.8 07/02/2018  1544   ALBUMIN 4.6 07/02/2018 1544   AST 22 07/02/2018 1544   ALT 29 07/02/2018 1544   ALKPHOS 119 (H) 07/02/2018 1544   BILITOT 0.4 07/02/2018 1544   BILIDIR 0.1 01/02/2012 1626   IBILI 0.3 01/05/2010 2038      Component Value Date/Time   TSH 2.36 07/02/2018 1544   TSH 1.26 06/08/2017 1457   TSH 0.93 06/15/2016 1359     Ref. Range 08/30/2018 12:52  Vitamin D, 25-Hydroxy Latest Ref Range: 30.0 - 100.0 ng/mL 25.3 (L)    I, Doreene Nest, am acting as Location manager for [employer]. Owens Shark, DO  I have reviewed the above documentation for accuracy and completeness, and I agree with the above. -Jearld Lesch, DO

## 2018-11-19 ENCOUNTER — Ambulatory Visit: Payer: Self-pay | Admitting: Internal Medicine

## 2018-11-19 ENCOUNTER — Encounter (INDEPENDENT_AMBULATORY_CARE_PROVIDER_SITE_OTHER): Payer: Self-pay | Admitting: Bariatrics

## 2018-11-22 MED FILL — LOSARTAN-HCTZ 100-25 MG TAB: 100-25 | 30 days supply | Qty: 30 | Fill #4

## 2018-11-24 ENCOUNTER — Other Ambulatory Visit: Payer: Self-pay | Admitting: Internal Medicine

## 2018-11-24 MED FILL — metFORMIN HCL 500 MG TABS: 500 | 30 days supply | Qty: 60 | Fill #0

## 2018-11-27 MED FILL — ADVAIR 250/50 DISKUS: 250-50 | 60 days supply | Qty: 60 | Fill #0

## 2018-11-29 ENCOUNTER — Ambulatory Visit (INDEPENDENT_AMBULATORY_CARE_PROVIDER_SITE_OTHER): Payer: 59 | Admitting: Bariatrics

## 2018-11-29 ENCOUNTER — Other Ambulatory Visit: Payer: Self-pay

## 2018-11-29 ENCOUNTER — Encounter (INDEPENDENT_AMBULATORY_CARE_PROVIDER_SITE_OTHER): Payer: Self-pay | Admitting: Bariatrics

## 2018-11-29 DIAGNOSIS — R7303 Prediabetes: Secondary | ICD-10-CM

## 2018-11-29 DIAGNOSIS — E559 Vitamin D deficiency, unspecified: Secondary | ICD-10-CM | POA: Diagnosis not present

## 2018-11-29 DIAGNOSIS — Z6837 Body mass index (BMI) 37.0-37.9, adult: Secondary | ICD-10-CM | POA: Diagnosis not present

## 2018-11-29 MED ORDER — VITAMIN D (ERGOCALCIFEROL) 1.25 MG (50000 UNIT) PO CAPS: 50000.0000 [IU] | ORAL_CAPSULE | ORAL | 0 refills | Status: DC

## 2018-11-29 MED FILL — VIT D2 1.25 MG (50,000 UNIT: 1.25 MG | 28 days supply | Qty: 4 | Fill #0

## 2018-12-03 NOTE — Progress Notes (Signed)
Office: [phone]  /  Fax: [phone] TeleHealth Visit:  Katherine Tanner has verbally consented to this TeleHealth visit today. The patient is located at home, the provider is located at the [facility] office. The participants in this visit include the listed provider and patient and any and all parties involved. The visit was conducted today via FaceTime.  HPI:   Chief Complaint: OBESITY Katherine Tanner is here to discuss her progress with her obesity treatment plan. She is on the Category 2 plan and is following her eating plan approximately 98 % of the time. She states she is riding the stationary bike and doing cardio exercise 30 to 40 minutes 5 times per week. Katherine Tanner has lost 1 pound (weight 189 lbs). She has been very busy. Katherine Tanner is doing well with the  We were unable to weigh the patient today for this TeleHealth visit. She feels as if she has lost weight since her last visit. She has lost 9 lbs since starting treatment with [name].  Pre-Diabetes Katherine Tanner has a diagnosis of prediabetes based on her elevated Hgb A1c and was informed this puts her at greater risk of developing diabetes. Her last A1c was at 5.9 and last insulin level was at 9.3 Katherine Tanner is taking metformin currently and she continues to work on diet and exercise to decrease risk of diabetes. She denies nausea or hypoglycemia.  Vitamin D deficiency Katherine Tanner has a diagnosis of vitamin D deficiency. Her last vitamin D level was at 25.3 She is currently taking vit D and denies nausea, vomiting or muscle weakness.  ASSESSMENT AND PLAN:  Prediabetes  Vitamin D deficiency - Plan: Vitamin D, Ergocalciferol, (DRISDOL) 1.25 MG (50000 UT) CAPS capsule  Class 2 severe obesity with serious comorbidity and body mass index (BMI) of 37.0 to 37.9 in adult, unspecified obesity type ([place])  PLAN:  Pre-Diabetes Katherine Tanner will continue to work on weight loss, exercise, and decreasing simple carbohydrates in her diet to help  decrease the risk of diabetes. We dicussed metformin including benefits and risks. She was informed that eating too many simple carbohydrates or too many calories at one sitting increases the likelihood of GI side effects. Annamay will continue metformin and follow up with [name] as directed to monitor her progress.  Vitamin D Deficiency Katherine Tanner was informed that low vitamin D levels contributes to fatigue and are associated with obesity, breast, and colon cancer. She agrees to continue to take prescription Vit D @50 ,000 IU every week #4 with no refills and will follow up for routine testing of vitamin D, at least 2-3 times per year. She was informed of the risk of over-replacement of vitamin D and agrees to not increase her dose unless she discusses this with [name] first. Tariya agrees to follow up as directed.  Obesity Katherine Tanner is currently in the action stage of change. As such, her goal is to continue with weight loss efforts She has agreed to follow the Category 2 plan Katherine Tanner will continue her exercise regimen for weight loss and overall health benefits. We discussed the following Behavioral Modification Strategies today: increase H2O intake, no skipping meals, keeping healthy foods in the home, increasing lean protein intake, decreasing simple carbohydrates, increasing vegetables, decrease eating out and work on meal planning and easy cooking plans Katherine Tanner will weigh herself at home and record. She will begin to use MyFitnessPal app.  Katherine Tanner has agreed to follow up with our clinic in 2 weeks. She was informed of the importance of frequent follow up  visits to maximize her success with intensive lifestyle modifications for her multiple health conditions.  ALLERGIES: Allergies  Allergen Reactions  . Ace Inhibitors Shortness Of Breath and Cough    Cough/ breathing problems   . Amoxicillin-Pot Clavulanate Swelling    SWELLING REACTION UNSPECIFIED   . Influenza Virus Vacc Split Pf     UNSPECIFIED REACTION    . Shellfish Allergy Itching    MEDICATIONS: Current Outpatient Medications on File Prior to Visit  Medication Sig Dispense Refill  . benzonatate (TESSALON) 200 MG capsule Take 1 capsule (200 mg total) by mouth 3 (three) times daily as needed for cough. 30 capsule 1  . Calcium Citrate-Vitamin D (CALCIUM + D PO) Take 1 tablet by mouth daily.    [name] EPINEPHRINE 0.3 mg/0.3 mL IJ SOAJ injection INJECT 1 SYRINGE INTO THE MUSCLE ONCE 1 Device 12  . fluticasone (FLONASE) 50 MCG/ACT nasal spray Place 2 sprays into both nostrils daily. 48 g 3  . Fluticasone-Salmeterol (ADVAIR DISKUS) 250-50 MCG/DOSE AEPB INHALE 1 PUFF BY MOUTH ONCE DAILY THEN RINSE MOUTH 60 each 2  . guaiFENesin (MUCINEX) 600 MG 12 hr tablet Take 600 mg by mouth 2 (two) times daily as needed.     [name] ipratropium-albuterol (DUONEB) 0.5-2.5 (3) MG/3ML SOLN USE 1 VIAL VIA NEBULIZER EVERY 6 HOURS AS NEEDED 90 mL 12  . loratadine (CLARITIN) 10 MG tablet Take 10 mg by mouth daily.     [name] losartan-hydrochlorothiazide (HYZAAR) 100-25 MG tablet Take 1 tablet by mouth daily. 90 tablet 3  . metFORMIN (GLUCOPHAGE) 500 MG tablet Take 1 tablet (500 mg total) by mouth 2 (two) times daily with a meal. 60 tablet 0  . montelukast (SINGULAIR) 10 MG tablet TAKE 1 TABLET BY MOUTH AT BEDTIME. 90 tablet 3  . Multiple Vitamin (MULTIVITAMIN PO) Take 1 tablet by mouth daily.     . nabumetone (RELAFEN) 750 MG tablet Take 750 mg by mouth 2 (two) times daily as needed. Patient stated she only will take 1 tablet a day as needed    . Nebulizers (COMPRESSOR/NEBULIZER) MISC Use as directed 1 each 0  . promethazine-codeine (PHENERGAN WITH CODEINE) 6.25-10 MG/5ML syrup 5 ml every 6 hours if needed for cough 200 mL 0  . VENTOLIN HFA 108 (90 Base) MCG/ACT inhaler INHALE 2 PUFFS BY MOUTH INTO THE LUNGS EVERY 6 HOURS AS NEEDED FOR WHEEZING OR SHORTNESS OF BREATH. 18 g 12   No current facility-administered medications on file prior to visit.     PAST MEDICAL HISTORY: Past  Medical History:  Diagnosis Date  . Allergy    takes Claritin daily and Flonase daily  . Arthritis   . Asthma    uses Albuterol daily as needed;Singulair nightly;DUlera daily  . Back pain   . Constipation   . Fatty liver   . Food allergy   . GERD (gastroesophageal reflux disease)    occasionally will take OTC meds but states she just watches what she eats  . H/O hiatal hernia   . History of bronchitis    last time 70yrs ago  . History of colon polyps   . History of kidney stones   . History of shingles   . Hyperlipidemia   . Hypertension    takes Hyzaar daily  . Joint pain   . Joint pain   . Joint swelling   . Obesity   . Osteoarthritis   . Pneumonia    hx of;last time at least 38yrs ago  . PONV (postoperative  nausea and vomiting)   . Prediabetes   . Shortness of breath   . Sleep apnea   . Swelling of lower extremity   . Trouble in sleeping   . Wheezing     PAST SURGICAL HISTORY: Past Surgical History:  Procedure Laterality Date  . BREAST EXCISIONAL BIOPSY Right   . CARDIAC CATHETERIZATION    . COLONOSCOPY    . ESOPHAGOGASTRODUODENOSCOPY    . KNEE SURGERY Right    arthroscopy  . Right ganglion cyst     x 2  . right lumpectomy  around 1983  . STERIOD INJECTION Left 11/15/2013   Procedure: Marcaine/STEROID INJECTION;  Surgeon: Alta Corning, MD;  Location: [place];  Service: Orthopedics;  Laterality: Left;  . TOTAL KNEE ARTHROPLASTY Right 11/15/2013   Procedure: RIGHT TOTAL KNEE ARTHROPLASTY;  Surgeon: Alta Corning, MD;  Location: [place];  Service: Orthopedics;  Laterality: Right;  . TOTAL KNEE ARTHROPLASTY Left 12/30/2016   Procedure: TOTAL KNEE ARTHROPLASTY;  Surgeon: Dorna Leitz, MD;  Location: [employer];  Service: Orthopedics;  Laterality: Left;  . TUBAL LIGATION      SOCIAL HISTORY: Social History   Tobacco Use  . Smoking status: Never Smoker  . Smokeless tobacco: Never Used  Substance Use Topics  . Alcohol use: No    Alcohol/week: 0.0 standard drinks  .  Drug use: No    FAMILY HISTORY: Family History  Problem Relation Age of Onset  . Diabetes Father   . Cancer Father   . Liver disease Father   . Alcoholism Father   . Sarcoidosis Mother   . Glaucoma Mother   . Hypertension Mother   . Hyperlipidemia Mother   . Sleep apnea Mother   . Obesity Mother   . Breast cancer Neg Hx     ROS: Review of Systems  Constitutional: Positive for weight loss.  Gastrointestinal: Negative for nausea and vomiting.  Musculoskeletal:       Negative for muscle weakness  Endo/Heme/Allergies:       Negative for hypoglycemia    PHYSICAL EXAM: Pt in no acute distress  RECENT LABS AND TESTS: BMET    Component Value Date/Time   NA 140 07/02/2018 1544   K 3.7 07/02/2018 1544   CL 100 07/02/2018 1544   CO2 30 07/02/2018 1544   GLUCOSE 80 07/02/2018 1544   BUN 17 07/02/2018 1544   CREATININE 0.72 07/02/2018 1544   CALCIUM 10.4 07/02/2018 1544   GFRNONAA >60 07/07/2017 1207   GFRAA >60 07/07/2017 1207   Lab Results  Component Value Date   HGBA1C 5.9 (H) 08/30/2018   HGBA1C 6.1 06/08/2017   HGBA1C 6.1 06/15/2016   HGBA1C 6.1 06/05/2015   HGBA1C 6.0 11/17/2014   Lab Results  Component Value Date   INSULIN 9.3 08/30/2018   CBC    Component Value Date/Time   WBC 9.8 07/02/2018 1544   RBC 4.76 07/02/2018 1544   HGB 13.7 07/02/2018 1544   HCT 41.8 07/02/2018 1544   PLT 125.0 (L) 07/02/2018 1544   MCV 87.9 07/02/2018 1544   MCH 28.8 07/07/2017 1207   MCHC 32.8 07/02/2018 1544   RDW 14.0 07/02/2018 1544   LYMPHSABS 2.7 07/02/2018 1544   MONOABS 1.0 07/02/2018 1544   EOSABS 0.2 07/02/2018 1544   BASOSABS 0.1 07/02/2018 1544   Iron/TIBC/Ferritin/ %Sat No results found for: IRON, TIBC, FERRITIN, IRONPCTSAT Lipid Panel     Component Value Date/Time   CHOL 194 10/04/2018 1457   TRIG 69.0 10/04/2018 1457  HDL 51.00 10/04/2018 1457   CHOLHDL 4 10/04/2018 1457   VLDL 13.8 10/04/2018 1457   LDLCALC 129 (H) 10/04/2018 1457    LDLDIRECT 147.9 12/17/2012 1604   Hepatic Function Panel     Component Value Date/Time   PROT 7.8 07/02/2018 1544   ALBUMIN 4.6 07/02/2018 1544   AST 22 07/02/2018 1544   ALT 29 07/02/2018 1544   ALKPHOS 119 (H) 07/02/2018 1544   BILITOT 0.4 07/02/2018 1544   BILIDIR 0.1 01/02/2012 1626   IBILI 0.3 01/05/2010 2038      Component Value Date/Time   TSH 2.36 07/02/2018 1544   TSH 1.26 06/08/2017 1457   TSH 0.93 06/15/2016 1359     Ref. Range 08/30/2018 12:52  Vitamin D, 25-Hydroxy Latest Ref Range: 30.0 - 100.0 ng/mL 25.3 (L)    I, Doreene Nest, am acting as Location manager for [employer]. Owens Shark, DO  I have reviewed the above documentation for accuracy and completeness, and I agree with the above. -Jearld Lesch, DO

## 2018-12-06 ENCOUNTER — Encounter (INDEPENDENT_AMBULATORY_CARE_PROVIDER_SITE_OTHER): Payer: Self-pay

## 2018-12-10 ENCOUNTER — Encounter (INDEPENDENT_AMBULATORY_CARE_PROVIDER_SITE_OTHER): Payer: Self-pay | Admitting: Bariatrics

## 2018-12-10 ENCOUNTER — Other Ambulatory Visit: Payer: Self-pay

## 2018-12-10 ENCOUNTER — Ambulatory Visit (INDEPENDENT_AMBULATORY_CARE_PROVIDER_SITE_OTHER): Payer: 59 | Admitting: Bariatrics

## 2018-12-10 DIAGNOSIS — E559 Vitamin D deficiency, unspecified: Secondary | ICD-10-CM

## 2018-12-10 DIAGNOSIS — Z6837 Body mass index (BMI) 37.0-37.9, adult: Secondary | ICD-10-CM

## 2018-12-10 DIAGNOSIS — R7303 Prediabetes: Secondary | ICD-10-CM

## 2018-12-10 NOTE — Progress Notes (Signed)
Office: [phone]  /  Fax: [phone] TeleHealth Visit:  Katherine Tanner has verbally consented to this TeleHealth visit today. The patient is located at home, the provider is located at the [facility] office. The participants in this visit include the listed provider and patient and any and all parties involved. The visit was conducted today via FaceTime.  HPI:   Chief Complaint: OBESITY Katherine Tanner is here to discuss her progress with her obesity treatment plan. She is on the Category 2 plan and is following her eating plan approximately 95 % of the time. She states she is walking and riding the stationary bike 35 to 40 minutes 5 times per week. Katherine Tanner states that she has lost 1 pound (weight 189 lbs). She is using "MyFitnessPal". She struggles with water. We were unable to weigh the patient today for this TeleHealth visit. She feels as if she has lost weight since her last visit. She has lost 9 lbs since starting treatment with [name].  Pre-Diabetes Katherine Tanner has a diagnosis of prediabetes based on her elevated Hgb A1c and was informed this puts her at greater risk of developing diabetes. Her last A1c was at 5.9 and last insulin level was at 9.3 Katherine Tanner is taking metformin currently and she continues to work on diet and exercise to decrease risk of diabetes. She denies polyphagia.  Vitamin D deficiency Katherine Tanner has a diagnosis of vitamin D deficiency. Her last vitamin D level was at 25.3 Katherine Tanner is currently taking vit D and she denies nausea, vomiting or muscle weakness.  ASSESSMENT AND PLAN:  No diagnosis found.  PLAN:  Pre-Diabetes Katherine Tanner will continue to work on weight loss, exercise, and decreasing simple carbohydrates in her diet to help decrease the risk of diabetes. We dicussed metformin including benefits and risks. She was informed that eating too many simple carbohydrates or too many calories at one sitting increases the likelihood of GI side effects. Katherine Tanner  will continue metformin for now and a prescription was not written today. Katherine Tanner agreed to follow up with [name] as directed to monitor her progress.  Vitamin D Deficiency Katherine Tanner was informed that low vitamin D levels contributes to fatigue and are associated with obesity, breast, and colon cancer. She will continue to take prescription Vit D @50 ,000 IU every week and will follow up for routine testing of vitamin D, at least 2-3 times per year. She was informed of the risk of over-replacement of vitamin D and agrees to not increase her dose unless she discusses this with [name] first.  Obesity Katherine Tanner is currently in the action stage of change. As such, her goal is to continue with weight loss efforts She has agreed to follow the Category 2 plan Katherine Tanner will continue her exercise regimen for weight loss and overall health benefits. We discussed the following Behavioral Modification Strategies today: increase H2O intake, no skipping meals, keeping healthy foods in the home, increasing lean protein intake, decreasing simple carbohydrates, increasing vegetables, decrease eating out, work on meal planning and easy cooking plans and avoiding temptations Katherine Tanner will weigh herself at home and record. She will continue to use "MyFitnessPal".  Katherine Tanner has agreed to follow up with our clinic in 2 weeks. She was informed of the importance of frequent follow up visits to maximize her success with intensive lifestyle modifications for her multiple health conditions.  ALLERGIES: Allergies  Allergen Reactions  . Ace Inhibitors Shortness Of Breath and Cough    Cough/ breathing problems   . Amoxicillin-Pot Clavulanate Swelling  SWELLING REACTION UNSPECIFIED   . Influenza Virus Vacc Split Pf     UNSPECIFIED REACTION   . Shellfish Allergy Itching    MEDICATIONS: Current Outpatient Medications on File Prior to Visit  Medication Sig Dispense Refill  . benzonatate (TESSALON) 200 MG capsule Take 1 capsule (200 mg  total) by mouth 3 (three) times daily as needed for cough. 30 capsule 1  . Calcium Citrate-Vitamin D (CALCIUM + D PO) Take 1 tablet by mouth daily.    [name] EPINEPHRINE 0.3 mg/0.3 mL IJ SOAJ injection INJECT 1 SYRINGE INTO THE MUSCLE ONCE 1 Device 12  . fluticasone (FLONASE) 50 MCG/ACT nasal spray Place 2 sprays into both nostrils daily. 48 g 3  . Fluticasone-Salmeterol (ADVAIR DISKUS) 250-50 MCG/DOSE AEPB INHALE 1 PUFF BY MOUTH ONCE DAILY THEN RINSE MOUTH 60 each 2  . guaiFENesin (MUCINEX) 600 MG 12 hr tablet Take 600 mg by mouth 2 (two) times daily as needed.     [name] ipratropium-albuterol (DUONEB) 0.5-2.5 (3) MG/3ML SOLN USE 1 VIAL VIA NEBULIZER EVERY 6 HOURS AS NEEDED 90 mL 12  . loratadine (CLARITIN) 10 MG tablet Take 10 mg by mouth daily.     [name] losartan-hydrochlorothiazide (HYZAAR) 100-25 MG tablet Take 1 tablet by mouth daily. 90 tablet 3  . metFORMIN (GLUCOPHAGE) 500 MG tablet Take 1 tablet (500 mg total) by mouth 2 (two) times daily with a meal. 60 tablet 0  . montelukast (SINGULAIR) 10 MG tablet TAKE 1 TABLET BY MOUTH AT BEDTIME. 90 tablet 3  . Multiple Vitamin (MULTIVITAMIN PO) Take 1 tablet by mouth daily.     . nabumetone (RELAFEN) 750 MG tablet Take 750 mg by mouth 2 (two) times daily as needed. Patient stated she only will take 1 tablet a day as needed    . Nebulizers (COMPRESSOR/NEBULIZER) MISC Use as directed 1 each 0  . promethazine-codeine (PHENERGAN WITH CODEINE) 6.25-10 MG/5ML syrup 5 ml every 6 hours if needed for cough 200 mL 0  . VENTOLIN HFA 108 (90 Base) MCG/ACT inhaler INHALE 2 PUFFS BY MOUTH INTO THE LUNGS EVERY 6 HOURS AS NEEDED FOR WHEEZING OR SHORTNESS OF BREATH. 18 g 12  . Vitamin D, Ergocalciferol, (DRISDOL) 1.25 MG (50000 UT) CAPS capsule Take 1 capsule (50,000 Units total) by mouth every 7 (seven) days. 4 capsule 0   No current facility-administered medications on file prior to visit.     PAST MEDICAL HISTORY: Past Medical History:  Diagnosis Date  . Allergy     takes Claritin daily and Flonase daily  . Arthritis   . Asthma    uses Albuterol daily as needed;Singulair nightly;DUlera daily  . Back pain   . Constipation   . Fatty liver   . Food allergy   . GERD (gastroesophageal reflux disease)    occasionally will take OTC meds but states she just watches what she eats  . H/O hiatal hernia   . History of bronchitis    last time 25yrs ago  . History of colon polyps   . History of kidney stones   . History of shingles   . Hyperlipidemia   . Hypertension    takes Hyzaar daily  . Joint pain   . Joint pain   . Joint swelling   . Obesity   . Osteoarthritis   . Pneumonia    hx of;last time at least 89yrs ago  . PONV (postoperative nausea and vomiting)   . Prediabetes   . Shortness of breath   . Sleep  apnea   . Swelling of lower extremity   . Trouble in sleeping   . Wheezing     PAST SURGICAL HISTORY: Past Surgical History:  Procedure Laterality Date  . BREAST EXCISIONAL BIOPSY Right   . CARDIAC CATHETERIZATION    . COLONOSCOPY    . ESOPHAGOGASTRODUODENOSCOPY    . KNEE SURGERY Right    arthroscopy  . Right ganglion cyst     x 2  . right lumpectomy  around 1983  . STERIOD INJECTION Left 11/15/2013   Procedure: Marcaine/STEROID INJECTION;  Surgeon: Alta Corning, MD;  Location: [facility];  Service: Orthopedics;  Laterality: Left;  . TOTAL KNEE ARTHROPLASTY Right 11/15/2013   Procedure: RIGHT TOTAL KNEE ARTHROPLASTY;  Surgeon: Alta Corning, MD;  Location: [facility];  Service: Orthopedics;  Laterality: Right;  . TOTAL KNEE ARTHROPLASTY Left 12/30/2016   Procedure: TOTAL KNEE ARTHROPLASTY;  Surgeon: Dorna Leitz, MD;  Location: [place];  Service: Orthopedics;  Laterality: Left;  . TUBAL LIGATION      SOCIAL HISTORY: Social History   Tobacco Use  . Smoking status: Never Smoker  . Smokeless tobacco: Never Used  Substance Use Topics  . Alcohol use: No    Alcohol/week: 0.0 standard drinks  . Drug use: No    FAMILY HISTORY: Family History   Problem Relation Age of Onset  . Diabetes Father   . Cancer Father   . Liver disease Father   . Alcoholism Father   . Sarcoidosis Mother   . Glaucoma Mother   . Hypertension Mother   . Hyperlipidemia Mother   . Sleep apnea Mother   . Obesity Mother   . Breast cancer Neg Hx     ROS: Review of Systems  Constitutional: Positive for weight loss.  Gastrointestinal: Negative for nausea and vomiting.  Musculoskeletal:       Negative for muscle weakness  Endo/Heme/Allergies:       Negative for polyphagia    PHYSICAL EXAM: Pt in no acute distress  RECENT LABS AND TESTS: BMET    Component Value Date/Time   NA 140 07/02/2018 1544   K 3.7 07/02/2018 1544   CL 100 07/02/2018 1544   CO2 30 07/02/2018 1544   GLUCOSE 80 07/02/2018 1544   BUN 17 07/02/2018 1544   CREATININE 0.72 07/02/2018 1544   CALCIUM 10.4 07/02/2018 1544   GFRNONAA >60 07/07/2017 1207   GFRAA >60 07/07/2017 1207   Lab Results  Component Value Date   HGBA1C 5.9 (H) 08/30/2018   HGBA1C 6.1 06/08/2017   HGBA1C 6.1 06/15/2016   HGBA1C 6.1 06/05/2015   HGBA1C 6.0 11/17/2014   Lab Results  Component Value Date   INSULIN 9.3 08/30/2018   CBC    Component Value Date/Time   WBC 9.8 07/02/2018 1544   RBC 4.76 07/02/2018 1544   HGB 13.7 07/02/2018 1544   HCT 41.8 07/02/2018 1544   PLT 125.0 (L) 07/02/2018 1544   MCV 87.9 07/02/2018 1544   MCH 28.8 07/07/2017 1207   MCHC 32.8 07/02/2018 1544   RDW 14.0 07/02/2018 1544   LYMPHSABS 2.7 07/02/2018 1544   MONOABS 1.0 07/02/2018 1544   EOSABS 0.2 07/02/2018 1544   BASOSABS 0.1 07/02/2018 1544   Iron/TIBC/Ferritin/ %Sat No results found for: IRON, TIBC, FERRITIN, IRONPCTSAT Lipid Panel     Component Value Date/Time   CHOL 194 10/04/2018 1457   TRIG 69.0 10/04/2018 1457   HDL 51.00 10/04/2018 1457   CHOLHDL 4 10/04/2018 1457   VLDL 13.8 10/04/2018  [place] 129 (H) 10/04/2018 1457   LDLDIRECT 147.9 12/17/2012 1604   Hepatic Function Panel      Component Value Date/Time   PROT 7.8 07/02/2018 1544   ALBUMIN 4.6 07/02/2018 1544   AST 22 07/02/2018 1544   ALT 29 07/02/2018 1544   ALKPHOS 119 (H) 07/02/2018 1544   BILITOT 0.4 07/02/2018 1544   BILIDIR 0.1 01/02/2012 1626   IBILI 0.3 01/05/2010 2038      Component Value Date/Time   TSH 2.36 07/02/2018 1544   TSH 1.26 06/08/2017 1457   TSH 0.93 06/15/2016 1359    Results for BAYLOR, TEEGARDEN (MRN [id number]) as of 12/10/2018 15:01  Ref. Range 08/30/2018 12:52  Vitamin D, 25-Hydroxy Latest Ref Range: 30.0 - 100.0 ng/mL 25.3 (L)    I, Doreene Nest, am acting as Location manager for [employer]. Owens Shark, DO  I have reviewed the above documentation for accuracy and completeness, and I agree with the above. -Jearld Lesch, DO

## 2018-12-12 ENCOUNTER — Other Ambulatory Visit: Payer: Self-pay | Admitting: Obstetrics and Gynecology

## 2018-12-12 DIAGNOSIS — Z1231 Encounter for screening mammogram for malignant neoplasm of breast: Secondary | ICD-10-CM

## 2018-12-17 DIAGNOSIS — M67912 Unspecified disorder of synovium and tendon, left shoulder: Secondary | ICD-10-CM | POA: Diagnosis not present

## 2018-12-17 DIAGNOSIS — M67814 Other specified disorders of tendon, left shoulder: Secondary | ICD-10-CM | POA: Diagnosis not present

## 2018-12-17 DIAGNOSIS — M25562 Pain in left knee: Secondary | ICD-10-CM | POA: Diagnosis not present

## 2018-12-17 MED FILL — MELOXICAM 15 MG TABLET: 15 | 30 days supply | Qty: 30 | Fill #0

## 2018-12-24 MED FILL — BENZONATATE 200 MG CAP: 200 | 10 days supply | Qty: 30 | Fill #1

## 2018-12-24 MED FILL — LOSARTAN-HCTZ 100-25 MG TAB: 100-25 | 30 days supply | Qty: 30 | Fill #5

## 2018-12-25 ENCOUNTER — Telehealth (INDEPENDENT_AMBULATORY_CARE_PROVIDER_SITE_OTHER): Payer: 59 | Admitting: Bariatrics

## 2018-12-25 ENCOUNTER — Encounter (INDEPENDENT_AMBULATORY_CARE_PROVIDER_SITE_OTHER): Payer: Self-pay | Admitting: Bariatrics

## 2018-12-25 ENCOUNTER — Other Ambulatory Visit: Payer: Self-pay

## 2018-12-25 DIAGNOSIS — E559 Vitamin D deficiency, unspecified: Secondary | ICD-10-CM | POA: Diagnosis not present

## 2018-12-25 DIAGNOSIS — Z6837 Body mass index (BMI) 37.0-37.9, adult: Secondary | ICD-10-CM | POA: Diagnosis not present

## 2018-12-25 DIAGNOSIS — R7303 Prediabetes: Secondary | ICD-10-CM | POA: Diagnosis not present

## 2018-12-25 MED ORDER — METFORMIN HCL 500 MG PO TABS: 500.0000 mg | ORAL_TABLET | Freq: Two times a day (BID) | ORAL | 0 refills | Status: DC

## 2018-12-25 MED ORDER — VITAMIN D (ERGOCALCIFEROL) 1.25 MG (50000 UNIT) PO CAPS: 50000.0000 [IU] | ORAL_CAPSULE | ORAL | 0 refills | Status: DC

## 2018-12-25 MED FILL — VIT D2 1.25 MG (50,000 UNIT: 1.25 MG | 28 days supply | Qty: 4 | Fill #0

## 2018-12-25 MED FILL — metFORMIN HCL 500 MG TABS: 500 | 30 days supply | Qty: 60 | Fill #0

## 2018-12-26 NOTE — Progress Notes (Signed)
Office: [phone]  /  Fax: [phone] TeleHealth Visit:  Katherine Tanner has verbally consented to this TeleHealth visit today. The patient is located at home, the provider is located at the [facility] office. The participants in this visit include the listed provider and patient.. The visit was conducted today via Face Time.  HPI:   Chief Complaint: OBESITY Katherine Tanner is here to discuss her progress with her obesity treatment plan. She is on the Category 2 plan and is following her eating plan approximately 90 % of the time. She states she is cycling 30 to 40 minutes 6 times per week. Katherine Tanner states that she has lost 2 pounds. She is drinking more water. She states that she it was hard to find some meats.  We were unable to weigh the patient today for this TeleHealth visit. She feels as if she has lost weight since her last visit. She has lost 1 lb since starting treatment with [name].  Vitamin D deficiency Katherine Tanner has a diagnosis of vitamin D deficiency. She is currently taking vit D and denies nausea, vomiting, or muscle weakness.  Pre-Diabetes Katherine Tanner has a diagnosis of pre-diabetes based on her elevated Hgb A1c of 5.9 and an insulin of 9.3 on 08/30/18 and was informed this puts her at greater risk of developing diabetes. She is taking metformin currently and continues to work on diet and exercise to decrease risk of diabetes.   ASSESSMENT AND PLAN:  Vitamin D deficiency - Plan: Vitamin D, Ergocalciferol, (DRISDOL) 1.25 MG (50000 UT) CAPS capsule  Prediabetes - Plan: metFORMIN (GLUCOPHAGE) 500 MG tablet  Class 2 severe obesity with serious comorbidity and body mass index (BMI) of 37.0 to 37.9 in adult, unspecified obesity type ([place])  PLAN:  Vitamin D Deficiency Katherine Tanner was informed that low vitamin D levels contribute to fatigue and are associated with obesity, breast, and colon cancer. [name] agrees to continue to take prescription Vit D @50 ,000 IU every week #4  with no refills and will follow up for routine testing of vitamin D, at least 2-3 times per year. She was informed of the risk of over-replacement of vitamin D and agrees to not increase her dose unless she discusses this with [name] first. Katherine Tanner agrees to follow up in 3 weeks as directed.  Pre-Diabetes Katherine Tanner will continue to work on weight loss, exercise, and decreasing simple carbohydrates in her diet to help decrease the risk of diabetes. She was informed that eating too many simple carbohydrates or too many calories at one sitting increases the likelihood of GI side effects. Katherine Tanner agreed to continue metformin 500 mg BID #60 with no refills and a prescription was written today. Katherine Tanner agreed to follow up with [name] as directed to monitor her progress in 3 weeks.   Obesity Katherine Tanner is currently in the action stage of change. As such, her goal is to continue with weight loss efforts. She has agreed to follow the Category 2 plan with meal planning. Katherine Tanner has been instructed to continue her exercise and to start back with bands. We discussed the following Behavioral Modification Strategies today: increasing lean protein intake, increase H2O intake, no skipping meals, decreasing simple carbohydrates, increasing vegetables, decrease eating out, and work on meal planning and easy cooking plans. Katherine Tanner agrees to weigh at home until she can return to the office.  Katherine Tanner has agreed to follow up with our clinic in 2 weeks. She was informed of the importance of frequent follow up visits to maximize her success  with intensive lifestyle modifications for her multiple health conditions.  ALLERGIES: Allergies  Allergen Reactions  . Ace Inhibitors Shortness Of Breath and Cough    Cough/ breathing problems   . Amoxicillin-Pot Clavulanate Swelling    SWELLING REACTION UNSPECIFIED   . Influenza Virus Vacc Split Pf     UNSPECIFIED REACTION   . Shellfish Allergy Itching    MEDICATIONS: Current Outpatient  Medications on File Prior to Visit  Medication Sig Dispense Refill  . benzonatate (TESSALON) 200 MG capsule Take 1 capsule (200 mg total) by mouth 3 (three) times daily as needed for cough. 30 capsule 1  . Calcium Citrate-Vitamin D (CALCIUM + D PO) Take 1 tablet by mouth daily.    [name] EPINEPHRINE 0.3 mg/0.3 mL IJ SOAJ injection INJECT 1 SYRINGE INTO THE MUSCLE ONCE 1 Device 12  . fluticasone (FLONASE) 50 MCG/ACT nasal spray Place 2 sprays into both nostrils daily. 48 g 3  . Fluticasone-Salmeterol (ADVAIR DISKUS) 250-50 MCG/DOSE AEPB INHALE 1 PUFF BY MOUTH ONCE DAILY THEN RINSE MOUTH 60 each 2  . guaiFENesin (MUCINEX) 600 MG 12 hr tablet Take 600 mg by mouth 2 (two) times daily as needed.     [name] ipratropium-albuterol (DUONEB) 0.5-2.5 (3) MG/3ML SOLN USE 1 VIAL VIA NEBULIZER EVERY 6 HOURS AS NEEDED 90 mL 12  . loratadine (CLARITIN) 10 MG tablet Take 10 mg by mouth daily.     [name] losartan-hydrochlorothiazide (HYZAAR) 100-25 MG tablet Take 1 tablet by mouth daily. 90 tablet 3  . montelukast (SINGULAIR) 10 MG tablet TAKE 1 TABLET BY MOUTH AT BEDTIME. 90 tablet 3  . Multiple Vitamin (MULTIVITAMIN PO) Take 1 tablet by mouth daily.     . nabumetone (RELAFEN) 750 MG tablet Take 750 mg by mouth 2 (two) times daily as needed. Patient stated she only will take 1 tablet a day as needed    . Nebulizers (COMPRESSOR/NEBULIZER) MISC Use as directed 1 each 0  . promethazine-codeine (PHENERGAN WITH CODEINE) 6.25-10 MG/5ML syrup 5 ml every 6 hours if needed for cough 200 mL 0  . VENTOLIN HFA 108 (90 Base) MCG/ACT inhaler INHALE 2 PUFFS BY MOUTH INTO THE LUNGS EVERY 6 HOURS AS NEEDED FOR WHEEZING OR SHORTNESS OF BREATH. 18 g 12   No current facility-administered medications on file prior to visit.     PAST MEDICAL HISTORY: Past Medical History:  Diagnosis Date  . Allergy    takes Claritin daily and Flonase daily  . Arthritis   . Asthma    uses Albuterol daily as needed;Singulair nightly;DUlera daily  . Back pain    . Constipation   . Fatty liver   . Food allergy   . GERD (gastroesophageal reflux disease)    occasionally will take OTC meds but states she just watches what she eats  . H/O hiatal hernia   . History of bronchitis    last time 24yrs ago  . History of colon polyps   . History of kidney stones   . History of shingles   . Hyperlipidemia   . Hypertension    takes Hyzaar daily  . Joint pain   . Joint pain   . Joint swelling   . Obesity   . Osteoarthritis   . Pneumonia    hx of;last time at least 62yrs ago  . PONV (postoperative nausea and vomiting)   . Prediabetes   . Shortness of breath   . Sleep apnea   . Swelling of lower extremity   . Trouble in  sleeping   . Wheezing     PAST SURGICAL HISTORY: Past Surgical History:  Procedure Laterality Date  . BREAST EXCISIONAL BIOPSY Right   . CARDIAC CATHETERIZATION    . COLONOSCOPY    . ESOPHAGOGASTRODUODENOSCOPY    . KNEE SURGERY Right    arthroscopy  . Right ganglion cyst     x 2  . right lumpectomy  around 1983  . STERIOD INJECTION Left 11/15/2013   Procedure: Marcaine/STEROID INJECTION;  Surgeon: Alta Corning, MD;  Location: [place];  Service: Orthopedics;  Laterality: Left;  . TOTAL KNEE ARTHROPLASTY Right 11/15/2013   Procedure: RIGHT TOTAL KNEE ARTHROPLASTY;  Surgeon: Alta Corning, MD;  Location: [place];  Service: Orthopedics;  Laterality: Right;  . TOTAL KNEE ARTHROPLASTY Left 12/30/2016   Procedure: TOTAL KNEE ARTHROPLASTY;  Surgeon: Dorna Leitz, MD;  Location: [place];  Service: Orthopedics;  Laterality: Left;  . TUBAL LIGATION      SOCIAL HISTORY: Social History   Tobacco Use  . Smoking status: Never Smoker  . Smokeless tobacco: Never Used  Substance Use Topics  . Alcohol use: No    Alcohol/week: 0.0 standard drinks  . Drug use: No    FAMILY HISTORY: Family History  Problem Relation Age of Onset  . Diabetes Father   . Cancer Father   . Liver disease Father   . Alcoholism Father   . Sarcoidosis Mother    . Glaucoma Mother   . Hypertension Mother   . Hyperlipidemia Mother   . Sleep apnea Mother   . Obesity Mother   . Breast cancer Neg Hx     ROS: Review of Systems  Gastrointestinal: Negative for nausea and vomiting.  Musculoskeletal:       Negative for muscle weakness.    PHYSICAL EXAM: Pt in no acute distress  RECENT LABS AND TESTS: BMET    Component Value Date/Time   NA 140 07/02/2018 1544   K 3.7 07/02/2018 1544   CL 100 07/02/2018 1544   CO2 30 07/02/2018 1544   GLUCOSE 80 07/02/2018 1544   BUN 17 07/02/2018 1544   CREATININE 0.72 07/02/2018 1544   CALCIUM 10.4 07/02/2018 1544   GFRNONAA >60 07/07/2017 1207   GFRAA >60 07/07/2017 1207   Lab Results  Component Value Date   HGBA1C 5.9 (H) 08/30/2018   HGBA1C 6.1 06/08/2017   HGBA1C 6.1 06/15/2016   HGBA1C 6.1 06/05/2015   HGBA1C 6.0 11/17/2014   Lab Results  Component Value Date   INSULIN 9.3 08/30/2018   CBC    Component Value Date/Time   WBC 9.8 07/02/2018 1544   RBC 4.76 07/02/2018 1544   HGB 13.7 07/02/2018 1544   HCT 41.8 07/02/2018 1544   PLT 125.0 (L) 07/02/2018 1544   MCV 87.9 07/02/2018 1544   MCH 28.8 07/07/2017 1207   MCHC 32.8 07/02/2018 1544   RDW 14.0 07/02/2018 1544   LYMPHSABS 2.7 07/02/2018 1544   MONOABS 1.0 07/02/2018 1544   EOSABS 0.2 07/02/2018 1544   BASOSABS 0.1 07/02/2018 1544   Iron/TIBC/Ferritin/ %Sat No results found for: IRON, TIBC, FERRITIN, IRONPCTSAT Lipid Panel     Component Value Date/Time   CHOL 194 10/04/2018 1457   TRIG 69.0 10/04/2018 1457   HDL 51.00 10/04/2018 1457   CHOLHDL 4 10/04/2018 1457   VLDL 13.8 10/04/2018 1457   LDLCALC 129 (H) 10/04/2018 1457   LDLDIRECT 147.9 12/17/2012 1604   Hepatic Function Panel     Component Value Date/Time   PROT 7.8  07/02/2018 1544   ALBUMIN 4.6 07/02/2018 1544   AST 22 07/02/2018 1544   ALT 29 07/02/2018 1544   ALKPHOS 119 (H) 07/02/2018 1544   BILITOT 0.4 07/02/2018 1544   BILIDIR 0.1 01/02/2012 1626    IBILI 0.3 01/05/2010 2038      Component Value Date/Time   TSH 2.36 07/02/2018 1544   TSH 1.26 06/08/2017 1457   TSH 0.93 06/15/2016 1359   Results for JOPLIN, CANTY (MRN [id number]) as of 12/26/2018 15:45  Ref. Range 08/30/2018 12:52  Vitamin D, 25-Hydroxy Latest Ref Range: 30.0 - 100.0 ng/mL 25.3 (L)     I, Marcille Blanco, CMA, am acting as Location manager for [employer]. Owens Shark, DO  I have reviewed the above documentation for accuracy and completeness, and I agree with the above. -Jearld Lesch, DO

## 2019-01-14 ENCOUNTER — Other Ambulatory Visit: Payer: Self-pay

## 2019-01-14 ENCOUNTER — Ambulatory Visit (INDEPENDENT_AMBULATORY_CARE_PROVIDER_SITE_OTHER): Payer: 59 | Admitting: Bariatrics

## 2019-01-14 ENCOUNTER — Encounter (INDEPENDENT_AMBULATORY_CARE_PROVIDER_SITE_OTHER): Payer: Self-pay | Admitting: Bariatrics

## 2019-01-14 VITALS — BP 145/82 | HR 71 | Temp 98.2°F | Ht 61.0 in | Wt 183.0 lb

## 2019-01-14 DIAGNOSIS — E669 Obesity, unspecified: Secondary | ICD-10-CM

## 2019-01-14 DIAGNOSIS — Z9189 Other specified personal risk factors, not elsewhere classified: Secondary | ICD-10-CM

## 2019-01-14 DIAGNOSIS — Z6834 Body mass index (BMI) 34.0-34.9, adult: Secondary | ICD-10-CM

## 2019-01-14 DIAGNOSIS — E559 Vitamin D deficiency, unspecified: Secondary | ICD-10-CM | POA: Diagnosis not present

## 2019-01-14 DIAGNOSIS — R7303 Prediabetes: Secondary | ICD-10-CM | POA: Diagnosis not present

## 2019-01-14 NOTE — Progress Notes (Signed)
Office: [phone]  /  Fax: [phone]   HPI:   Chief Complaint: OBESITY Katherine Tanner is here to discuss her progress with her obesity treatment plan. She is on the Category 2 plan and is following her eating plan approximately 90 % of the time. She states she is riding her bike 30 minutes 5 times per week. Katherine Tanner is getting adequate water and protein. Her weight is 183 lb (83 kg) today and has had a weight loss of 14 pounds since her last in-office visit. She has lost 15 lbs since starting treatment with [name].  Pre-Diabetes Katherine Tanner has a diagnosis of prediabetes based on her elevated Hgb A1c and was informed this puts her at greater risk of developing diabetes. Her last A1c was at 5.9 and last insulin level was at 9.3 She is taking metformin currently and continues to work on diet and exercise to decrease risk of diabetes. Her appetite is normal to less.  Vitamin D deficiency Katherine Tanner has a diagnosis of vitamin D deficiency. Her last vitamin D level was at 25.3 She is currently taking vit D and denies nausea, vomiting or muscle weakness.  At risk for osteopenia and osteoporosis Katherine Tanner is at higher risk of osteopenia and osteoporosis due to vitamin D deficiency.   ASSESSMENT AND PLAN:  Prediabetes - Plan: Comprehensive metabolic panel, Hemoglobin A1c, Insulin, random  Vitamin D deficiency - Plan: VITAMIN D 25 Hydroxy (Vit-D Deficiency, Fractures)  At risk for osteoporosis  Class 1 obesity with serious comorbidity and body mass index (BMI) of 34.0 to 34.9 in adult, unspecified obesity type  PLAN:  Pre-Diabetes Katherine Tanner will continue to work on weight loss, exercise, increasing lean protein and decreasing simple carbohydrates in her diet to help decrease the risk of diabetes. She was informed that eating too many simple carbohydrates or too many calories at one sitting increases the likelihood of GI side effects. Katherine Tanner agreed to follow up with [name] as directed to monitor her progress.   Vitamin D Deficiency Katherine Tanner was informed that low vitamin D levels contributes to fatigue and are associated with obesity, breast, and colon cancer. She will continue to take prescription Vit D @50 ,000 IU every week and will follow up for routine testing of vitamin D, at least 2-3 times per year. She was informed of the risk of over-replacement of vitamin D and agrees to not increase her dose unless she discusses this with [name] first. We will check vitamin D level and Katherine Tanner will follow up as directed.  At risk for osteopenia and osteoporosis Katherine Tanner was given extended  (15 minutes) osteoporosis prevention counseling today. Katherine Tanner is at risk for osteopenia and osteoporosis due to her vitamin D deficiency. She was encouraged to take her vitamin D and follow her higher calcium diet and increase strengthening exercise to help strengthen her bones and decrease her risk of osteopenia and osteoporosis.  Obesity Katherine Tanner is currently in the action stage of change. As such, her goal is to continue with weight loss efforts She has agreed to follow the Category 2 plan Katherine Tanner has been instructed to work up to a goal of 150 minutes of combined cardio and strengthening exercise per week for weight loss and overall health benefits. We discussed the following Behavioral Modification Strategies today: increase H2O intake, no skipping meals, keeping healthy foods in the home, increasing lean protein intake, decreasing simple carbohydrates, increasing vegetables, decrease eating out and work on meal planning and intentional eating  Katherine Tanner has agreed to follow up with our clinic  in 2 weeks. She was informed of the importance of frequent follow up visits to maximize her success with intensive lifestyle modifications for her multiple health conditions.  ALLERGIES: Allergies  Allergen Reactions  . Ace Inhibitors Shortness Of Breath and Cough    Cough/ breathing problems   . Amoxicillin-Pot Clavulanate Swelling     SWELLING REACTION UNSPECIFIED   . Influenza Virus Vacc Split Pf     UNSPECIFIED REACTION   . Shellfish Allergy Itching    MEDICATIONS: Current Outpatient Medications on File Prior to Visit  Medication Sig Dispense Refill  . Calcium Citrate-Vitamin D (CALCIUM + D PO) Take 1 tablet by mouth daily.    [name] EPINEPHRINE 0.3 mg/0.3 mL IJ SOAJ injection INJECT 1 SYRINGE INTO THE MUSCLE ONCE 1 Device 12  . fluticasone (FLONASE) 50 MCG/ACT nasal spray Place 2 sprays into both nostrils daily. 48 g 3  . Fluticasone-Salmeterol (ADVAIR DISKUS) 250-50 MCG/DOSE AEPB INHALE 1 PUFF BY MOUTH ONCE DAILY THEN RINSE MOUTH 60 each 2  . ipratropium-albuterol (DUONEB) 0.5-2.5 (3) MG/3ML SOLN USE 1 VIAL VIA NEBULIZER EVERY 6 HOURS AS NEEDED 90 mL 12  . loratadine (CLARITIN) 10 MG tablet Take 10 mg by mouth daily.     [name] losartan-hydrochlorothiazide (HYZAAR) 100-25 MG tablet Take 1 tablet by mouth daily. 90 tablet 3  . metFORMIN (GLUCOPHAGE) 500 MG tablet Take 1 tablet (500 mg total) by mouth 2 (two) times daily with a meal. 60 tablet 0  . montelukast (SINGULAIR) 10 MG tablet TAKE 1 TABLET BY MOUTH AT BEDTIME. 90 tablet 3  . Multiple Vitamin (MULTIVITAMIN PO) Take 1 tablet by mouth daily.     . nabumetone (RELAFEN) 750 MG tablet Take 750 mg by mouth 2 (two) times daily as needed. Patient stated she only will take 1 tablet a day as needed    . Nebulizers (COMPRESSOR/NEBULIZER) MISC Use as directed 1 each 0  . VENTOLIN HFA 108 (90 Base) MCG/ACT inhaler INHALE 2 PUFFS BY MOUTH INTO THE LUNGS EVERY 6 HOURS AS NEEDED FOR WHEEZING OR SHORTNESS OF BREATH. 18 g 12  . Vitamin D, Ergocalciferol, (DRISDOL) 1.25 MG (50000 UT) CAPS capsule Take 1 capsule (50,000 Units total) by mouth every 7 (seven) days. 4 capsule 0  . benzonatate (TESSALON) 200 MG capsule Take 1 capsule (200 mg total) by mouth 3 (three) times daily as needed for cough. (Patient not taking: Reported on 01/14/2019) 30 capsule 1  . guaiFENesin (MUCINEX) 600 MG 12 hr  tablet Take 600 mg by mouth 2 (two) times daily as needed.     . promethazine-codeine (PHENERGAN WITH CODEINE) 6.25-10 MG/5ML syrup 5 ml every 6 hours if needed for cough (Patient not taking: Reported on 01/14/2019) 200 mL 0   No current facility-administered medications on file prior to visit.     PAST MEDICAL HISTORY: Past Medical History:  Diagnosis Date  . Allergy    takes Claritin daily and Flonase daily  . Arthritis   . Asthma    uses Albuterol daily as needed;Singulair nightly;DUlera daily  . Back pain   . Constipation   . Fatty liver   . Food allergy   . GERD (gastroesophageal reflux disease)    occasionally will take OTC meds but states she just watches what she eats  . H/O hiatal hernia   . History of bronchitis    last time 53yrs ago  . History of colon polyps   . History of kidney stones   . History of shingles   .  Hyperlipidemia   . Hypertension    takes Hyzaar daily  . Joint pain   . Joint pain   . Joint swelling   . Obesity   . Osteoarthritis   . Pneumonia    hx of;last time at least 80yrs ago  . PONV (postoperative nausea and vomiting)   . Prediabetes   . Shortness of breath   . Sleep apnea   . Swelling of lower extremity   . Trouble in sleeping   . Wheezing     PAST SURGICAL HISTORY: Past Surgical History:  Procedure Laterality Date  . BREAST EXCISIONAL BIOPSY Right   . CARDIAC CATHETERIZATION    . COLONOSCOPY    . ESOPHAGOGASTRODUODENOSCOPY    . KNEE SURGERY Right    arthroscopy  . Right ganglion cyst     x 2  . right lumpectomy  around 1983  . STERIOD INJECTION Left 11/15/2013   Procedure: Marcaine/STEROID INJECTION;  Surgeon: Alta Corning, MD;  Location: [facility];  Service: Orthopedics;  Laterality: Left;  . TOTAL KNEE ARTHROPLASTY Right 11/15/2013   Procedure: RIGHT TOTAL KNEE ARTHROPLASTY;  Surgeon: Alta Corning, MD;  Location: [place];  Service: Orthopedics;  Laterality: Right;  . TOTAL KNEE ARTHROPLASTY Left 12/30/2016   Procedure: TOTAL  KNEE ARTHROPLASTY;  Surgeon: Dorna Leitz, MD;  Location: [place];  Service: Orthopedics;  Laterality: Left;  . TUBAL LIGATION      SOCIAL HISTORY: Social History   Tobacco Use  . Smoking status: Never Smoker  . Smokeless tobacco: Never Used  Substance Use Topics  . Alcohol use: No    Alcohol/week: 0.0 standard drinks  . Drug use: No    FAMILY HISTORY: Family History  Problem Relation Age of Onset  . Diabetes Father   . Cancer Father   . Liver disease Father   . Alcoholism Father   . Sarcoidosis Mother   . Glaucoma Mother   . Hypertension Mother   . Hyperlipidemia Mother   . Sleep apnea Mother   . Obesity Mother   . Breast cancer Neg Hx     ROS: Review of Systems  Constitutional: Positive for weight loss.  Gastrointestinal: Negative for nausea and vomiting.  Musculoskeletal:       Negative for muscle weakness  Endo/Heme/Allergies:       Negative for polyphagia    PHYSICAL EXAM: Blood pressure (!) 145/82, pulse 71, temperature 98.2 F (36.8 C), temperature source Oral, height 5\' 1"  (1.549 m), weight 183 lb (83 kg), last menstrual period 02/20/2014, SpO2 100 %. Body mass index is 34.58 kg/m. Physical Exam Vitals signs reviewed.  Constitutional:      Appearance: Normal appearance. She is well-developed. She is obese.  Cardiovascular:     Rate and Rhythm: Normal rate.  Pulmonary:     Effort: Pulmonary effort is normal.  Musculoskeletal: Normal range of motion.  Skin:    General: Skin is warm and dry.  Neurological:     Mental Status: She is alert and oriented to person, place, and time.  Psychiatric:        Mood and Affect: Mood normal.        Behavior: Behavior normal.     RECENT LABS AND TESTS: BMET    Component Value Date/Time   NA 140 07/02/2018 1544   K 3.7 07/02/2018 1544   CL 100 07/02/2018 1544   CO2 30 07/02/2018 1544   GLUCOSE 80 07/02/2018 1544   BUN 17 07/02/2018 1544   CREATININE 0.72  07/02/2018 1544   CALCIUM 10.4 07/02/2018 1544    GFRNONAA >60 07/07/2017 1207   GFRAA >60 07/07/2017 1207   Lab Results  Component Value Date   HGBA1C 5.9 (H) 08/30/2018   HGBA1C 6.1 06/08/2017   HGBA1C 6.1 06/15/2016   HGBA1C 6.1 06/05/2015   HGBA1C 6.0 11/17/2014   Lab Results  Component Value Date   INSULIN 9.3 08/30/2018   CBC    Component Value Date/Time   WBC 9.8 07/02/2018 1544   RBC 4.76 07/02/2018 1544   HGB 13.7 07/02/2018 1544   HCT 41.8 07/02/2018 1544   PLT 125.0 (L) 07/02/2018 1544   MCV 87.9 07/02/2018 1544   MCH 28.8 07/07/2017 1207   MCHC 32.8 07/02/2018 1544   RDW 14.0 07/02/2018 1544   LYMPHSABS 2.7 07/02/2018 1544   MONOABS 1.0 07/02/2018 1544   EOSABS 0.2 07/02/2018 1544   BASOSABS 0.1 07/02/2018 1544   Iron/TIBC/Ferritin/ %Sat No results found for: IRON, TIBC, FERRITIN, IRONPCTSAT Lipid Panel     Component Value Date/Time   CHOL 194 10/04/2018 1457   TRIG 69.0 10/04/2018 1457   HDL 51.00 10/04/2018 1457   CHOLHDL 4 10/04/2018 1457   VLDL 13.8 10/04/2018 1457   LDLCALC 129 (H) 10/04/2018 1457   LDLDIRECT 147.9 12/17/2012 1604   Hepatic Function Panel     Component Value Date/Time   PROT 7.8 07/02/2018 1544   ALBUMIN 4.6 07/02/2018 1544   AST 22 07/02/2018 1544   ALT 29 07/02/2018 1544   ALKPHOS 119 (H) 07/02/2018 1544   BILITOT 0.4 07/02/2018 1544   BILIDIR 0.1 01/02/2012 1626   IBILI 0.3 01/05/2010 2038      Component Value Date/Time   TSH 2.36 07/02/2018 1544   TSH 1.26 06/08/2017 1457   TSH 0.93 06/15/2016 1359     Ref. Range 08/30/2018 12:52  Vitamin D, 25-Hydroxy Latest Ref Range: 30.0 - 100.0 ng/mL 25.3 (L)    OBESITY BEHAVIORAL INTERVENTION VISIT  Today's visit was # 9   Starting weight: 198 lbs Starting date: 08/30/2018 Today's weight :  183 lbs Today's date: 01/14/2019 Total lbs lost to date: 15    01/14/2019  Height 5\' 1"  (1.549 m)  Weight 183 lb (83 kg)  BMI (Calculated) 34.6  BLOOD PRESSURE - SYSTOLIC 315  BLOOD PRESSURE - DIASTOLIC 82   Body Fat % 17.6  %  Total Body Water (lbs) 68.6 lbs    ASK: We discussed the diagnosis of obesity with Meral Trollinger-Smith today and Una agreed to give [name] permission to discuss obesity behavioral modification therapy today.  ASSESS: Marcey has the diagnosis of obesity and her BMI today is 34.6 Aysa is in the action stage of change   ADVISE: Jalana was educated on the multiple health risks of obesity as well as the benefit of weight loss to improve her health. She was advised of the need for long term treatment and the importance of lifestyle modifications to improve her current health and to decrease her risk of future health problems.  AGREE: Multiple dietary modification options and treatment options were discussed and  Sharrell agreed to follow the recommendations documented in the above note.  ARRANGE: Doyne was educated on the importance of frequent visits to treat obesity as outlined per CMS and USPSTF guidelines and agreed to schedule her next follow up appointment today.  Corey Skains, am acting as Location manager for [employer]. Owens Shark, DO  I have reviewed the above documentation for accuracy and completeness, and I agree with  the above. -Jearld Lesch, DO

## 2019-01-15 LAB — COMPREHENSIVE METABOLIC PANEL
ALT: 54 IU/L — ABNORMAL HIGH (ref 0–32)
AST: 31 IU/L (ref 0–40)
Albumin/Globulin Ratio: 1.7 (ref 1.2–2.2)
Albumin: 4.5 g/dL (ref 3.8–4.9)
Alkaline Phosphatase: 172 IU/L — ABNORMAL HIGH (ref 39–117)
BUN/Creatinine Ratio: 27 — ABNORMAL HIGH (ref 9–23)
BUN: 14 mg/dL (ref 6–24)
Bilirubin Total: 0.2 mg/dL (ref 0.0–1.2)
CO2: 25 mmol/L (ref 20–29)
Calcium: 10.1 mg/dL (ref 8.7–10.2)
Chloride: 103 mmol/L (ref 96–106)
Creatinine, Ser: 0.52 mg/dL — ABNORMAL LOW (ref 0.57–1.00)
GFR calc Af Amer: 124 mL/min/{1.73_m2} (ref >59)
GFR calc non Af Amer: 107 mL/min/{1.73_m2} (ref >59)
Globulin, Total: 2.6 g/dL (ref 1.5–4.5)
Glucose: 100 mg/dL — ABNORMAL HIGH (ref 65–99)
Potassium: 4.3 mmol/L (ref 3.5–5.2)
Sodium: 144 mmol/L (ref 134–144)
Total Protein: 7.1 g/dL (ref 6.0–8.5)

## 2019-01-15 LAB — VITAMIN D 25 HYDROXY (VIT D DEFICIENCY, FRACTURES): Vit D, 25-Hydroxy: 53.3 ng/mL (ref 30.0–100.0)

## 2019-01-15 LAB — HEMOGLOBIN A1C
Est. average glucose Bld gHb Est-mCnc: 123 mg/dL
Hgb A1c MFr Bld: 5.9 % — ABNORMAL HIGH (ref 4.8–5.6)

## 2019-01-15 LAB — INSULIN, RANDOM: INSULIN: 8.8 u[IU]/mL (ref 2.6–24.9)

## 2019-01-17 MED FILL — MELOXICAM 15 MG TABLET: 15 | 30 days supply | Qty: 30 | Fill #1

## 2019-01-17 MED FILL — LOSARTAN-HCTZ 100-25 MG TAB: 100-25 | 30 days supply | Qty: 30 | Fill #6

## 2019-01-17 MED FILL — MONTELUKAST SOD 10 MG TAB: 10 | 90 days supply | Qty: 90 | Fill #2

## 2019-01-21 ENCOUNTER — Encounter (INDEPENDENT_AMBULATORY_CARE_PROVIDER_SITE_OTHER): Payer: Self-pay | Admitting: Bariatrics

## 2019-01-21 NOTE — Telephone Encounter (Signed)
Please review

## 2019-01-25 MED FILL — ADVAIR 250/50 DISKUS: 250-50 | 60 days supply | Qty: 60 | Fill #1

## 2019-01-28 ENCOUNTER — Other Ambulatory Visit (INDEPENDENT_AMBULATORY_CARE_PROVIDER_SITE_OTHER): Payer: Self-pay | Admitting: Bariatrics

## 2019-01-28 ENCOUNTER — Ambulatory Visit
Admission: RE | Admit: 2019-01-28 | Discharge: 2019-01-28 | Disposition: A | Discharge status: Home / Self Care | Payer: 59 | Source: Ambulatory Visit | Attending: Obstetrics and Gynecology | Admitting: Obstetrics and Gynecology

## 2019-01-28 ENCOUNTER — Other Ambulatory Visit: Payer: Self-pay

## 2019-01-28 DIAGNOSIS — Z1231 Encounter for screening mammogram for malignant neoplasm of breast: Secondary | ICD-10-CM | POA: Diagnosis not present

## 2019-01-28 DIAGNOSIS — E559 Vitamin D deficiency, unspecified: Secondary | ICD-10-CM

## 2019-01-28 DIAGNOSIS — R7303 Prediabetes: Secondary | ICD-10-CM

## 2019-01-29 ENCOUNTER — Other Ambulatory Visit (INDEPENDENT_AMBULATORY_CARE_PROVIDER_SITE_OTHER): Payer: Self-pay | Admitting: Bariatrics

## 2019-01-29 DIAGNOSIS — R7303 Prediabetes: Secondary | ICD-10-CM

## 2019-01-29 DIAGNOSIS — E559 Vitamin D deficiency, unspecified: Secondary | ICD-10-CM

## 2019-01-29 MED FILL — metFORMIN HCL 500 MG TABS: 500 | 30 days supply | Qty: 60 | Fill #0

## 2019-01-29 MED FILL — VIT D2 1.25 MG (50,000 UNIT: 1.25 MG | 28 days supply | Qty: 4 | Fill #0

## 2019-02-04 ENCOUNTER — Encounter (INDEPENDENT_AMBULATORY_CARE_PROVIDER_SITE_OTHER): Payer: Self-pay | Admitting: Bariatrics

## 2019-02-04 ENCOUNTER — Ambulatory Visit (INDEPENDENT_AMBULATORY_CARE_PROVIDER_SITE_OTHER): Payer: 59 | Admitting: Bariatrics

## 2019-02-04 ENCOUNTER — Other Ambulatory Visit: Payer: Self-pay

## 2019-02-04 VITALS — BP 127/74 | HR 68 | Temp 98.1°F | Ht 61.0 in | Wt 183.0 lb

## 2019-02-04 DIAGNOSIS — E669 Obesity, unspecified: Secondary | ICD-10-CM

## 2019-02-04 DIAGNOSIS — E559 Vitamin D deficiency, unspecified: Secondary | ICD-10-CM

## 2019-02-04 DIAGNOSIS — Z6834 Body mass index (BMI) 34.0-34.9, adult: Secondary | ICD-10-CM

## 2019-02-04 DIAGNOSIS — Z9189 Other specified personal risk factors, not elsewhere classified: Secondary | ICD-10-CM

## 2019-02-04 DIAGNOSIS — R7303 Prediabetes: Secondary | ICD-10-CM

## 2019-02-04 DIAGNOSIS — K5909 Other constipation: Secondary | ICD-10-CM | POA: Diagnosis not present

## 2019-02-04 MED ORDER — VITAMIN D (ERGOCALCIFEROL) 1.25 MG (50000 UNIT) PO CAPS: 50000.0000 [IU] | ORAL_CAPSULE | ORAL | 0 refills | Status: DC

## 2019-02-04 NOTE — Progress Notes (Signed)
Office: [phone]  /  Fax: [phone]   HPI:   Chief Complaint: OBESITY Katherine Tanner is here to discuss her progress with her obesity treatment plan. She is on the Category 2 plan and is following her eating plan approximately 90 % of the time. She states she is walking 10,000 steps 4 times per week. [name]'s weight has remained the same. She thought that she had gained weight. Her goal is 170 lbs at this time.  Her weight is 183 lb (83 kg) today and has not lost weight since her last visit. She has lost 15 lbs since starting treatment with [name].  Pre-Diabetes [name] has a diagnosis of pre-diabetes based on her elevated Hgb A1c and was informed this puts her at greater risk of developing diabetes. Last A1c was 5.9 and insulin of 8.8. She is taking metformin currently and continues to work on diet and exercise to decrease risk of diabetes. She denies nausea or hypoglycemia.  At risk for diabetes [name] is at higher than average risk for developing diabetes due to her obesity and pre-diabetes. She currently denies polyuria or polydipsia.  Vitamin D Deficiency [name] has a diagnosis of vitamin D deficiency. She is currently taking prescription Vit D. Last Vit D level 53.3. She denies nausea, vomiting or muscle weakness.  Constipation [name] notes constipation for the last few weeks, worse since attempting weight loss. She states BM are less frequent and are not hard and painful. She denies hematochezia or melena. She drinking more water.  ASSESSMENT AND PLAN:  Prediabetes  Vitamin D deficiency - Plan: Vitamin D, Ergocalciferol, (DRISDOL) 1.25 MG (50000 UT) CAPS capsule, DISCONTINUED: Vitamin D, Ergocalciferol, (DRISDOL) 1.25 MG (50000 UT) CAPS capsule  Other constipation  At risk for diabetes mellitus  Class 1 obesity with serious comorbidity and body mass index (BMI) of 34.0 to 34.9 in adult, unspecified obesity type  PLAN:  Pre-Diabetes [name] will continue to work on weight  loss, exercise, and decreasing simple carbohydrates in her diet to help decrease the risk of diabetes. We dicussed metformin including benefits and risks. She was informed that eating too many simple carbohydrates or too many calories at one sitting increases the likelihood of GI side effects. [name] agrees to continue taking metformin, and she agrees to follow up with our clinic in 2 weeks as directed to monitor her progress.  Diabetes risk counseling [name] was given extended (15 minutes) diabetes prevention counseling today. She is 58 y.o. female and has risk factors for diabetes including obesity and pre-diabetes. We discussed intensive lifestyle modifications today with an emphasis on weight loss as well as increasing exercise and decreasing simple carbohydrates in her diet.  Vitamin D Deficiency Doll was informed that low vitamin D levels contributes to fatigue and are associated with obesity, breast, and colon cancer. [name] agrees to change prescription Vit D to 50,000 IU every 14 days will follow up for routine testing of vitamin D, at least 2-3 times per year. She was informed of the risk of over-replacement of vitamin D and agrees to not increase her dose unless she discusses this with [name] first. [name] agrees to follow up with our clinic in 2 weeks.  Constipation [name] was informed decrease bowel movement frequency is normal while losing weight, but stools should not be hard or painful. She was advised to increase her H20 intake to 64 oz and work on increasing her fiber intake. High fiber foods were discussed today. [name] will begin miralax OTC 3 times per week. She  is to increase raw fruits and vegetable, and add flax seed. Trameka agrees to follow up with our clinic in 2 weeks.  Obesity [name] is currently in the action stage of change. As such, her goal is to continue with weight loss efforts She has agreed to follow the Category 2 plan [name] has been instructed to work up to a goal  of 150 minutes of combined cardio and strengthening exercise per week or continue current exercise (has a Ecologist) for weight loss and overall health benefits. We discussed the following Behavioral Modification Strategies today: increasing lean protein intake, decreasing simple carbohydrates, increasing vegetables, decrease eating out, work on meal planning and easy cooking plans, increase H20 intake, no skipping meal, and keeping healthy foods in the home   [name] has agreed to follow up with our clinic in 2 weeks. She was informed of the importance of frequent follow up visits to maximize her success with intensive lifestyle modifications for her multiple health conditions.  ALLERGIES: Allergies  Allergen Reactions   Ace Inhibitors Shortness Of Breath and Cough    Cough/ breathing problems    Amoxicillin-Pot Clavulanate Swelling    SWELLING REACTION UNSPECIFIED    Influenza Virus Vacc Split Pf     UNSPECIFIED REACTION    Shellfish Allergy Itching    MEDICATIONS: Current Outpatient Medications on File Prior to Visit  Medication Sig Dispense Refill   benzonatate (TESSALON) 200 MG capsule Take 1 capsule (200 mg total) by mouth 3 (three) times daily as needed for cough. 30 capsule 1   Calcium Citrate-Vitamin D (CALCIUM + D PO) Take 1 tablet by mouth daily.     EPINEPHRINE 0.3 mg/0.3 mL IJ SOAJ injection INJECT 1 SYRINGE INTO THE MUSCLE ONCE 1 Device 12   fluticasone (FLONASE) 50 MCG/ACT nasal spray Place 2 sprays into both nostrils daily. 48 g 3   Fluticasone-Salmeterol (ADVAIR DISKUS) 250-50 MCG/DOSE AEPB INHALE 1 PUFF BY MOUTH ONCE DAILY THEN RINSE MOUTH 60 each 2   guaiFENesin (MUCINEX) 600 MG 12 hr tablet Take 600 mg by mouth 2 (two) times daily as needed.      ipratropium-albuterol (DUONEB) 0.5-2.5 (3) MG/3ML SOLN USE 1 VIAL VIA NEBULIZER EVERY 6 HOURS AS NEEDED 90 mL 12   loratadine (CLARITIN) 10 MG tablet Take 10 mg by mouth daily.      losartan-hydrochlorothiazide  (HYZAAR) 100-25 MG tablet Take 1 tablet by mouth daily. 90 tablet 3   metFORMIN (GLUCOPHAGE) 500 MG tablet TAKE 1 TABLET (500 MG TOTAL) BY MOUTH 2 (TWO) TIMES DAILY WITH A MEAL. 60 tablet 0   montelukast (SINGULAIR) 10 MG tablet TAKE 1 TABLET BY MOUTH AT BEDTIME. 90 tablet 3   Multiple Vitamin (MULTIVITAMIN PO) Take 1 tablet by mouth daily.      Nebulizers (COMPRESSOR/NEBULIZER) MISC Use as directed 1 each 0   VENTOLIN HFA 108 (90 Base) MCG/ACT inhaler INHALE 2 PUFFS BY MOUTH INTO THE LUNGS EVERY 6 HOURS AS NEEDED FOR WHEEZING OR SHORTNESS OF BREATH. 18 g 12   nabumetone (RELAFEN) 750 MG tablet Take 750 mg by mouth 2 (two) times daily as needed. Patient stated she only will take 1 tablet a day as needed     promethazine-codeine (PHENERGAN WITH CODEINE) 6.25-10 MG/5ML syrup 5 ml every 6 hours if needed for cough (Patient not taking: Reported on 01/14/2019) 200 mL 0   No current facility-administered medications on file prior to visit.     PAST MEDICAL HISTORY: Past Medical History:  Diagnosis Date  Allergy    takes Claritin daily and Flonase daily   Arthritis    Asthma    uses Albuterol daily as needed;Singulair nightly;DUlera daily   Back pain    Constipation    Fatty liver    Food allergy    GERD (gastroesophageal reflux disease)    occasionally will take OTC meds but states she just watches what she eats   H/O hiatal hernia    History of bronchitis    last time 80yrs ago   History of colon polyps    History of kidney stones    History of shingles    Hyperlipidemia    Hypertension    takes Hyzaar daily   Joint pain    Joint pain    Joint swelling    Obesity    Osteoarthritis    Pneumonia    hx of;last time at least 17yrs ago   PONV (postoperative nausea and vomiting)    Prediabetes    Shortness of breath    Sleep apnea    Swelling of lower extremity    Trouble in sleeping    Wheezing     PAST SURGICAL HISTORY: Past Surgical  History:  Procedure Laterality Date   BREAST EXCISIONAL BIOPSY Right    CARDIAC CATHETERIZATION     COLONOSCOPY     ESOPHAGOGASTRODUODENOSCOPY     KNEE SURGERY Right    arthroscopy   Right ganglion cyst     x 2   right lumpectomy  around [place] Left 11/15/2013   Procedure: Marcaine/STEROID INJECTION;  Surgeon: Alta Corning, MD;  Location: [employer];  Service: Orthopedics;  Laterality: Left;   TOTAL KNEE ARTHROPLASTY Right 11/15/2013   Procedure: RIGHT TOTAL KNEE ARTHROPLASTY;  Surgeon: Alta Corning, MD;  Location[place];  Service: Orthopedics;  Laterality: Right;   TOTAL KNEE ARTHROPLASTY Left 12/30/2016   Procedure: TOTAL KNEE ARTHROPLASTY;  Surgeon: Dorna Leitz, MD;  Location: [place];  Service: Orthopedics;  Laterality: Left;   TUBAL LIGATION      SOCIAL HISTORY: Social History   Tobacco Use   Smoking status: Never Smoker   Smokeless tobacco: Never Used  Substance Use Topics   Alcohol use: No    Alcohol/week: 0.0 standard drinks   Drug use: No    FAMILY HISTORY: Family History  Problem Relation Age of Onset   Diabetes Father    Cancer Father    Liver disease Father    Alcoholism Father    Sarcoidosis Mother    Glaucoma Mother    Hypertension Mother    Hyperlipidemia Mother    Sleep apnea Mother    Obesity Mother    Breast cancer Neg Hx     ROS: Review of Systems  Constitutional: Negative for weight loss.  Gastrointestinal: Positive for constipation. Negative for melena, nausea and vomiting.       Negative hematochezia  Genitourinary: Negative for frequency.  Musculoskeletal:       Negative muscle weakness  Endo/Heme/Allergies: Negative for polydipsia.       Negative hypoglycemia    PHYSICAL EXAM: Blood pressure 127/74, pulse 68, temperature 98.1 F (36.7 C), temperature source Oral, height 5\' 1"  (1.549 m), weight 183 lb (83 kg), last menstrual period 02/20/2014, SpO2 100 %. Body mass index is 34.58  kg/m. Physical Exam Vitals signs reviewed.  Constitutional:      Appearance: Normal appearance. She is obese.  Cardiovascular:     Rate and Rhythm: Normal rate.  Pulses: Normal pulses.  Pulmonary:     Effort: Pulmonary effort is normal.     Breath sounds: Normal breath sounds.  Musculoskeletal: Normal range of motion.  Skin:    General: Skin is warm and dry.  Neurological:     Mental Status: She is alert and oriented to person, place, and time.  Psychiatric:        Mood and Affect: Mood normal.        Behavior: Behavior normal.     RECENT LABS AND TESTS: BMET    Component Value Date/Time   NA 144 01/14/2019 1118   K 4.3 01/14/2019 1118   CL 103 01/14/2019 1118   CO2 25 01/14/2019 1118   GLUCOSE 100 (H) 01/14/2019 1118   GLUCOSE 80 07/02/2018 1544   BUN 14 01/14/2019 1118   CREATININE 0.52 (L) 01/14/2019 1118   CALCIUM 10.1 01/14/2019 1118   GFRNONAA 107 01/14/2019 1118   GFRAA 124 01/14/2019 1118   Lab Results  Component Value Date   HGBA1C 5.9 (H) 01/14/2019   HGBA1C 5.9 (H) 08/30/2018   HGBA1C 6.1 06/08/2017   HGBA1C 6.1 06/15/2016   HGBA1C 6.1 06/05/2015   Lab Results  Component Value Date   INSULIN 8.8 01/14/2019   INSULIN 9.3 08/30/2018   CBC    Component Value Date/Time   WBC 9.8 07/02/2018 1544   RBC 4.76 07/02/2018 1544   HGB 13.7 07/02/2018 1544   HCT 41.8 07/02/2018 1544   PLT 125.0 (L) 07/02/2018 1544   MCV 87.9 07/02/2018 1544   MCH 28.8 07/07/2017 1207   MCHC 32.8 07/02/2018 1544   RDW 14.0 07/02/2018 1544   LYMPHSABS 2.7 07/02/2018 1544   MONOABS 1.0 07/02/2018 1544   EOSABS 0.2 07/02/2018 1544   BASOSABS 0.1 07/02/2018 1544   Iron/TIBC/Ferritin/ %Sat No results found for: IRON, TIBC, FERRITIN, IRONPCTSAT Lipid Panel     Component Value Date/Time   CHOL 194 10/04/2018 1457   TRIG 69.0 10/04/2018 1457   HDL 51.00 10/04/2018 1457   CHOLHDL 4 10/04/2018 1457   VLDL 13.8 10/04/2018 1457   LDLCALC 129 (H) 10/04/2018 1457    LDLDIRECT 147.9 12/17/2012 1604   Hepatic Function Panel     Component Value Date/Time   PROT 7.1 01/14/2019 1118   ALBUMIN 4.5 01/14/2019 1118   AST 31 01/14/2019 1118   ALT 54 (H) 01/14/2019 1118   ALKPHOS 172 (H) 01/14/2019 1118   BILITOT 0.2 01/14/2019 1118   BILIDIR 0.1 01/02/2012 1626   IBILI 0.3 01/05/2010 2038      Component Value Date/Time   TSH 2.36 07/02/2018 1544   TSH 1.26 06/08/2017 1457   TSH 0.93 06/15/2016 1359      OBESITY BEHAVIORAL INTERVENTION VISIT  Today's visit was # 11   Starting weight: 198 lbs Starting date: 08/30/2018 Today's weight : 183 lbs Today's date: 02/04/2019 Total lbs lost to date: 15    ASK: We discussed the diagnosis of obesity with [name] today and Meghana agreed to give [name] permission to discuss obesity behavioral modification therapy today.  ASSESS: Naomia has the diagnosis of obesity and her BMI today is 34.6 Jaelee is in the action stage of change   ADVISE: Otto was educated on the multiple health risks of obesity as well as the benefit of weight loss to improve her health. She was advised of the need for long term treatment and the importance of lifestyle modifications to improve her current health and to decrease her risk of future  health problems.  AGREE: Multiple dietary modification options and treatment options were discussed and  Wilder agreed to follow the recommendations documented in the above note.  ARRANGE: Skye was educated on the importance of frequent visits to treat obesity as outlined per CMS and USPSTF guidelines and agreed to schedule her next follow up appointment today.  Wilhemena Durie, am acting as transcriptionist for [employer], DO  I have reviewed the above documentation for accuracy and completeness, and I agree with the above. -Jearld Lesch, DO

## 2019-02-06 ENCOUNTER — Encounter (INDEPENDENT_AMBULATORY_CARE_PROVIDER_SITE_OTHER): Payer: Self-pay | Admitting: Bariatrics

## 2019-02-18 ENCOUNTER — Other Ambulatory Visit: Payer: Self-pay

## 2019-02-18 ENCOUNTER — Ambulatory Visit (INDEPENDENT_AMBULATORY_CARE_PROVIDER_SITE_OTHER): Payer: 59 | Admitting: Family Medicine

## 2019-02-18 VITALS — BP 131/74 | HR 64 | Temp 98.6°F | Ht 61.0 in | Wt 182.0 lb

## 2019-02-18 DIAGNOSIS — Z6834 Body mass index (BMI) 34.0-34.9, adult: Secondary | ICD-10-CM

## 2019-02-18 DIAGNOSIS — Z9189 Other specified personal risk factors, not elsewhere classified: Secondary | ICD-10-CM | POA: Diagnosis not present

## 2019-02-18 DIAGNOSIS — R7303 Prediabetes: Secondary | ICD-10-CM | POA: Diagnosis not present

## 2019-02-18 DIAGNOSIS — E669 Obesity, unspecified: Secondary | ICD-10-CM | POA: Diagnosis not present

## 2019-02-18 DIAGNOSIS — E559 Vitamin D deficiency, unspecified: Secondary | ICD-10-CM | POA: Diagnosis not present

## 2019-02-18 MED ORDER — METFORMIN HCL 500 MG PO TABS: 500.0000 mg | ORAL_TABLET | Freq: Two times a day (BID) | ORAL | 0 refills | Status: DC

## 2019-02-18 MED ORDER — VITAMIN D (ERGOCALCIFEROL) 1.25 MG (50000 UNIT) PO CAPS: 50000.0000 [IU] | ORAL_CAPSULE | ORAL | 0 refills | Status: DC

## 2019-02-19 NOTE — Progress Notes (Signed)
Office: [phone]  /  Fax: [phone]   HPI:   Chief Complaint: OBESITY Selda is here to discuss her progress with her obesity treatment plan. She is on the Category 2 plan and is following her eating plan approximately 95% of the time. She states she is cycling/weights/walking 30 minutes 5 times per week. Maddyson states she is not losing as quickly as she would like. Her weight is 182 lb (82.6 kg) today and has had a weight loss of 1 pound over a period of 2 weeks since her last visit. She has lost 16 lbs since starting treatment with [name].  Pre-Diabetes Vasilisa has a diagnosis of prediabetes based on her elevated Hgb A1c and was informed this puts her at greater risk of developing diabetes. Last A1c was 5.9 on 01/14/2019. She is taking metformin currently and continues to work on diet and exercise to decrease risk of diabetes. She denies nausea or vomiting.  At risk for diabetes Neah is at higher than average risk for developing diabetes due to her obesity. She currently denies polyuria or polydipsia.  Vitamin D deficiency Lyna has a diagnosis of Vitamin D deficiency, which is not at goal. Last Vitamin D was 53.3 on 01/14/2019. She is currently taking prescription Vit D every other week and denies nausea, vomiting or muscle weakness.  ASSESSMENT AND PLAN:  Prediabetes - Plan: metFORMIN (GLUCOPHAGE) 500 MG tablet  Vitamin D deficiency - Plan: Vitamin D, Ergocalciferol, (DRISDOL) 1.25 MG (50000 UT) CAPS capsule  At risk for diabetes mellitus  Class 1 obesity with serious comorbidity and body mass index (BMI) of 34.0 to 34.9 in adult, unspecified obesity type  PLAN:  Pre-Diabetes Joelynn will continue to work on weight loss, exercise, and decreasing simple carbohydrates in her diet to help decrease the risk of diabetes. We dicussed metformin including benefits and risks. She was informed that eating too many simple carbohydrates or too many calories at one sitting increases  the likelihood of GI side effects. Zayonna was given a refill on her metformin 500 mg BID #60 with 0 refills and agrees to follow-up with our clinic in 2-3 weeks.  Diabetes risk counseling Tateanna was given extended (15 minutes) diabetes prevention counseling today. She is 58 y.o. female and has risk factors for diabetes including obesity. We discussed intensive lifestyle modifications today with an emphasis on weight loss as well as increasing exercise and decreasing simple carbohydrates in her diet.  Vitamin D Deficiency Ricketta was informed that low Vitamin D levels contributes to fatigue and are associated with obesity, breast, and colon cancer. She agrees to continue taking prescription Vit D every other week and will follow-up for routine testing of Vitamin D, at least 2-3 times per year. She was informed of the risk of over-replacement of Vitamin D and agrees to not increase her dose unless she discusses this with [name] first. Shyonna agrees to follow-up with our clinic in 2-3 weeks.  Obesity Azaleah is currently in the action stage of change. As such, her goal is to continue with weight loss efforts. She has agreed to follow the Category 2 plan + 100 calories. 100 calorie snack list handout was given. We discussed other options for protein snacks. Tyia has been instructed to continue her current exercise regimen for weight loss and overall health benefits. We discussed the following Behavioral Modification Strategies today: increasing lean protein intake, better snacking choices, and planning for success.  Shacarra has agreed to follow-up with our clinic in 2-3 weeks. She was  informed of the importance of frequent follow-up visits to maximize her success with intensive lifestyle modifications for her multiple health conditions.  ALLERGIES: Allergies  Allergen Reactions  . Ace Inhibitors Shortness Of Breath and Cough    Cough/ breathing problems   . Amoxicillin-Pot Clavulanate Swelling     SWELLING REACTION UNSPECIFIED   . Influenza Virus Vacc Split Pf     UNSPECIFIED REACTION   . Shellfish Allergy Itching    MEDICATIONS: Current Outpatient Medications on File Prior to Visit  Medication Sig Dispense Refill  . Calcium Citrate-Vitamin D (CALCIUM + D PO) Take 1 tablet by mouth daily.    [name] EPINEPHRINE 0.3 mg/0.3 mL IJ SOAJ injection INJECT 1 SYRINGE INTO THE MUSCLE ONCE 1 Device 12  . fluticasone (FLONASE) 50 MCG/ACT nasal spray Place 2 sprays into both nostrils daily. 48 g 3  . Fluticasone-Salmeterol (ADVAIR DISKUS) 250-50 MCG/DOSE AEPB INHALE 1 PUFF BY MOUTH ONCE DAILY THEN RINSE MOUTH 60 each 2  . guaiFENesin (MUCINEX) 600 MG 12 hr tablet Take 600 mg by mouth 2 (two) times daily as needed.     [name] ipratropium-albuterol (DUONEB) 0.5-2.5 (3) MG/3ML SOLN USE 1 VIAL VIA NEBULIZER EVERY 6 HOURS AS NEEDED 90 mL 12  . loratadine (CLARITIN) 10 MG tablet Take 10 mg by mouth daily.     [name] losartan-hydrochlorothiazide (HYZAAR) 100-25 MG tablet Take 1 tablet by mouth daily. 90 tablet 3  . montelukast (SINGULAIR) 10 MG tablet TAKE 1 TABLET BY MOUTH AT BEDTIME. 90 tablet 3  . Multiple Vitamin (MULTIVITAMIN PO) Take 1 tablet by mouth daily.     . Nebulizers (COMPRESSOR/NEBULIZER) MISC Use as directed 1 each 0  . promethazine-codeine (PHENERGAN WITH CODEINE) 6.25-10 MG/5ML syrup 5 ml every 6 hours if needed for cough (Patient not taking: Reported on 01/14/2019) 200 mL 0  . VENTOLIN HFA 108 (90 Base) MCG/ACT inhaler INHALE 2 PUFFS BY MOUTH INTO THE LUNGS EVERY 6 HOURS AS NEEDED FOR WHEEZING OR SHORTNESS OF BREATH. 18 g 12   No current facility-administered medications on file prior to visit.     PAST MEDICAL HISTORY: Past Medical History:  Diagnosis Date  . Allergy    takes Claritin daily and Flonase daily  . Arthritis   . Asthma    uses Albuterol daily as needed;Singulair nightly;DUlera daily  . Back pain   . Constipation   . Fatty liver   . Food allergy   . GERD (gastroesophageal  reflux disease)    occasionally will take OTC meds but states she just watches what she eats  . H/O hiatal hernia   . History of bronchitis    last time 30yrs ago  . History of colon polyps   . History of kidney stones   . History of shingles   . Hyperlipidemia   . Hypertension    takes Hyzaar daily  . Joint pain   . Joint pain   . Joint swelling   . Obesity   . Osteoarthritis   . Pneumonia    hx of;last time at least 13yrs ago  . PONV (postoperative nausea and vomiting)   . Prediabetes   . Shortness of breath   . Sleep apnea   . Swelling of lower extremity   . Trouble in sleeping   . Wheezing     PAST SURGICAL HISTORY: Past Surgical History:  Procedure Laterality Date  . BREAST EXCISIONAL BIOPSY Right   . CARDIAC CATHETERIZATION    . COLONOSCOPY    . ESOPHAGOGASTRODUODENOSCOPY    .  KNEE SURGERY Right    arthroscopy  . Right ganglion cyst     x 2  . right lumpectomy  around 1983  . STERIOD INJECTION Left 11/15/2013   Procedure: Marcaine/STEROID INJECTION;  Surgeon: Alta Corning, MD;  Location: [facility];  Service: Orthopedics;  Laterality: Left;  . TOTAL KNEE ARTHROPLASTY Right 11/15/2013   Procedure: RIGHT TOTAL KNEE ARTHROPLASTY;  Surgeon: Alta Corning, MD;  Location: [facility];  Service: Orthopedics;  Laterality: Right;  . TOTAL KNEE ARTHROPLASTY Left 12/30/2016   Procedure: TOTAL KNEE ARTHROPLASTY;  Surgeon: Dorna Leitz, MD;  Location: [facility];  Service: Orthopedics;  Laterality: Left;  . TUBAL LIGATION      SOCIAL HISTORY: Social History   Tobacco Use  . Smoking status: Never Smoker  . Smokeless tobacco: Never Used  Substance Use Topics  . Alcohol use: No    Alcohol/week: 0.0 standard drinks  . Drug use: No    FAMILY HISTORY: Family History  Problem Relation Age of Onset  . Diabetes Father   . Cancer Father   . Liver disease Father   . Alcoholism Father   . Sarcoidosis Mother   . Glaucoma Mother   . Hypertension Mother   . Hyperlipidemia Mother   .  Sleep apnea Mother   . Obesity Mother   . Breast cancer Neg Hx    ROS: Review of Systems  Gastrointestinal: Negative for nausea and vomiting.  Musculoskeletal:       Negative for muscle weakness.   PHYSICAL EXAM: Blood pressure 131/74, pulse 64, temperature 98.6 F (37 C), height 5\' 1"  (1.549 m), weight 182 lb (82.6 kg), last menstrual period 02/20/2014, SpO2 100 %. Body mass index is 34.39 kg/m. Physical Exam Vitals signs reviewed.  Constitutional:      Appearance: Normal appearance. She is obese.  Cardiovascular:     Rate and Rhythm: Normal rate.     Pulses: Normal pulses.  Pulmonary:     Effort: Pulmonary effort is normal.     Breath sounds: Normal breath sounds.  Musculoskeletal: Normal range of motion.  Skin:    General: Skin is warm and dry.  Neurological:     Mental Status: She is alert and oriented to person, place, and time.  Psychiatric:        Behavior: Behavior normal.   RECENT LABS AND TESTS: BMET    Component Value Date/Time   NA 144 01/14/2019 1118   K 4.3 01/14/2019 1118   CL 103 01/14/2019 1118   CO2 25 01/14/2019 1118   GLUCOSE 100 (H) 01/14/2019 1118   GLUCOSE 80 07/02/2018 1544   BUN 14 01/14/2019 1118   CREATININE 0.52 (L) 01/14/2019 1118   CALCIUM 10.1 01/14/2019 1118   GFRNONAA 107 01/14/2019 1118   GFRAA 124 01/14/2019 1118   Lab Results  Component Value Date   HGBA1C 5.9 (H) 01/14/2019   HGBA1C 5.9 (H) 08/30/2018   HGBA1C 6.1 06/08/2017   HGBA1C 6.1 06/15/2016   HGBA1C 6.1 06/05/2015   Lab Results  Component Value Date   INSULIN 8.8 01/14/2019   INSULIN 9.3 08/30/2018   CBC    Component Value Date/Time   WBC 9.8 07/02/2018 1544   RBC 4.76 07/02/2018 1544   HGB 13.7 07/02/2018 1544   HCT 41.8 07/02/2018 1544   PLT 125.0 (L) 07/02/2018 1544   MCV 87.9 07/02/2018 1544   MCH 28.8 07/07/2017 1207   MCHC 32.8 07/02/2018 1544   RDW 14.0 07/02/2018 1544   LYMPHSABS 2.7  07/02/2018 1544   MONOABS 1.0 07/02/2018 1544   EOSABS  0.2 07/02/2018 1544   BASOSABS 0.1 07/02/2018 1544   Iron/TIBC/Ferritin/ %Sat No results found for: IRON, TIBC, FERRITIN, IRONPCTSAT Lipid Panel     Component Value Date/Time   CHOL 194 10/04/2018 1457   TRIG 69.0 10/04/2018 1457   HDL 51.00 10/04/2018 1457   CHOLHDL 4 10/04/2018 1457   VLDL 13.8 10/04/2018 1457   LDLCALC 129 (H) 10/04/2018 1457   LDLDIRECT 147.9 12/17/2012 1604   Hepatic Function Panel     Component Value Date/Time   PROT 7.1 01/14/2019 1118   ALBUMIN 4.5 01/14/2019 1118   AST 31 01/14/2019 1118   ALT 54 (H) 01/14/2019 1118   ALKPHOS 172 (H) 01/14/2019 1118   BILITOT 0.2 01/14/2019 1118   BILIDIR 0.1 01/02/2012 1626   IBILI 0.3 01/05/2010 2038      Component Value Date/Time   TSH 2.36 07/02/2018 1544   TSH 1.26 06/08/2017 1457   TSH 0.93 06/15/2016 1359   Results for DARIANE, NATZKE (MRN [id number]) as of 02/19/2019 09:43  Ref. Range 01/14/2019 11:18  Vitamin D, 25-Hydroxy Latest Ref Range: 30.0 - 100.0 ng/mL 53.3   OBESITY BEHAVIORAL INTERVENTION VISIT  Today's visit was #12  Starting weight: 198 lbs Starting date: 08/30/2018 Today's weight: 182 lbs  Today's date: 02/18/2019 Total lbs lost to date: 16    02/18/2019  Height 5\' 1"  (1.549 m)  Weight 182 lb (82.6 kg)  BMI (Calculated) 34.41  BLOOD PRESSURE - SYSTOLIC 956  BLOOD PRESSURE - DIASTOLIC 74   Body Fat % 38.7 %  Total Body Water (lbs) 69.41 lbs   ASK: We discussed the diagnosis of obesity with Madysen Trollinger-Smith today and Makinze agreed to give [name] permission to discuss obesity behavioral modification therapy today.  ASSESS: Jackson has the diagnosis of obesity and her BMI today is 34.5. Gaelyn is in the action stage of change.   ADVISE: Dashanna was educated on the multiple health risks of obesity as well as the benefit of weight loss to improve her health. She was advised of the need for long term treatment and the importance of lifestyle modifications to improve her  current health and to decrease her risk of future health problems.  AGREE: Multiple dietary modification options and treatment options were discussed and  Priscila agreed to follow the recommendations documented in the above note.  ARRANGE: Bella was educated on the importance of frequent visits to treat obesity as outlined per CMS and USPSTF guidelines and agreed to schedule her next follow up appointment today.  IMichaelene Song, am acting as Location manager for [employer], FNP  I have reviewed the above documentation for accuracy and completeness, and I agree with the above.  - Oakleigh Hesketh, FNP-C.

## 2019-02-20 ENCOUNTER — Encounter (INDEPENDENT_AMBULATORY_CARE_PROVIDER_SITE_OTHER): Payer: Self-pay | Admitting: Family Medicine

## 2019-02-20 MED FILL — LOSARTAN-HCTZ 100-25 MG TAB: 100-25 | 30 days supply | Qty: 30 | Fill #7

## 2019-02-25 ENCOUNTER — Ambulatory Visit: Payer: Self-pay | Admitting: Internal Medicine

## 2019-02-27 MED FILL — metFORMIN HCL 500 MG TABS: 500 | 30 days supply | Qty: 60 | Fill #0

## 2019-03-04 ENCOUNTER — Ambulatory Visit (INDEPENDENT_AMBULATORY_CARE_PROVIDER_SITE_OTHER): Payer: 59 | Admitting: Family Medicine

## 2019-03-04 ENCOUNTER — Other Ambulatory Visit: Payer: Self-pay

## 2019-03-04 ENCOUNTER — Ambulatory Visit: Payer: 59 | Admitting: Primary Care

## 2019-03-04 ENCOUNTER — Encounter: Payer: Self-pay | Admitting: Primary Care

## 2019-03-04 DIAGNOSIS — G4733 Obstructive sleep apnea (adult) (pediatric): Secondary | ICD-10-CM

## 2019-03-04 MED ORDER — ALBUTEROL SULFATE HFA 108 (90 BASE) MCG/ACT IN AERS: INHALATION_SPRAY | RESPIRATORY_TRACT | 12 refills | Status: DC

## 2019-03-04 MED ORDER — FLUTICASONE-SALMETEROL 250-50 MCG/DOSE IN AEPB: INHALATION_SPRAY | RESPIRATORY_TRACT | 2 refills | Status: DC

## 2019-03-04 MED ORDER — MONTELUKAST SODIUM 10 MG PO TABS: 10.0000 mg | ORAL_TABLET | Freq: Every day | ORAL | 3 refills | Status: DC

## 2019-03-04 MED ORDER — EPINEPHRINE 0.3 MG/0.3ML IJ SOAJ: INTRAMUSCULAR | 1 refills | Status: DC

## 2019-03-04 MED FILL — EPINEPHRINE 0.3 MG AUTO-INJ: 0.3 | 1 days supply | Qty: 2 | Fill #0

## 2019-03-04 MED FILL — ALBUTEROL SULFATE HFA 108 (: 108 (90 BAS | 25 days supply | Qty: 18 | Fill #0

## 2019-03-04 NOTE — Addendum Note (Signed)
Addended by: Martyn Ehrich on: 03/04/2019 09:39 AM   Modules accepted: Orders

## 2019-03-04 NOTE — Progress Notes (Signed)
@Patient  ID: Katherine Tanner, female    DOB: 10-22-1960, 58 y.o.   MRN: [id number]  Chief Complaint  Patient presents with  . Follow-up    [facility] pt being treated for asthma, osa but not on cpap.  states that she is doing well, at baseline.  does note some difficulty wearing mask during her 10-hr day.     Referring provider: Tower, [name], MD  HPI: 58 year old female, never smoker. PMH significant for obstructive sleep apnea, moderate intermittent asthma, allergic rhinitis, HTN. Patient of Dr. Annamaria Boots, last seen on 08/14/18. Exacerbates 2-3 times a year. Maintained on Advair, Singulair, ventolin HFA and duonebs prn.   03/04/2019 Patient presents today for regular follow-up. She is doing well today. She has had no further exacerbations since last visit in February. Her asthma symptoms vary. Using Advair 250 twice daily as prescribed and Singulair. Wearing cloth face mask bothers her at work, she does takes breaks to remove. She has lost 14-16 lbs with [facility] weight [facility]. Needs refill of epi-pen for allergy with shellfish. Denies current cough, shortness of breath or wheezing.    Significant testing: Office Spirometry 07/06/17-WNL-FVC 2.25/91%, FEV1 2.04/104%, ratio 0.91, FEF 25-75% 3.62/173% Methacholine inhalation challenge test 01/25/2018-interpreted as positive because of initiation of hard coughing although FEV1 it only declined by 15%. HST 10/27/2017-AHI 7.3/hour, desaturation to 88%, body weight 193 pounds  Allergies  Allergen Reactions  . Ace Inhibitors Shortness Of Breath and Cough    Cough/ breathing problems   . Amoxicillin-Pot Clavulanate Swelling    SWELLING REACTION UNSPECIFIED   . Influenza Virus Vacc Split Pf     UNSPECIFIED REACTION   . Shellfish Allergy Itching    Immunization History  Administered Date(s) Administered  . Pneumococcal Polysaccharide-23 12/09/2011  . Td 01/02/2001  . Tdap 12/09/2011    Past Medical History:  Diagnosis Date  . Allergy    takes Claritin daily and Flonase daily  . Arthritis   . Asthma    uses Albuterol daily as needed;Singulair nightly;DUlera daily  . Back pain   . Constipation   . Fatty liver   . Food allergy   . GERD (gastroesophageal reflux disease)    occasionally will take OTC meds but states she just watches what she eats  . H/O hiatal hernia   . History of bronchitis    last time 28yrs ago  . History of colon polyps   . History of kidney stones   . History of shingles   . Hyperlipidemia   . Hypertension    takes Hyzaar daily  . Joint pain   . Joint pain   . Joint swelling   . Obesity   . Osteoarthritis   . Pneumonia    hx of;last time at least 57yrs ago  . PONV (postoperative nausea and vomiting)   . Prediabetes   . Shortness of breath   . Sleep apnea   . Swelling of lower extremity   . Trouble in sleeping   . Wheezing     Tobacco History: Social History   Tobacco Use  Smoking Status Never Smoker  Smokeless Tobacco Never Used   Counseling given: Not Answered   Outpatient Medications Prior to Visit  Medication Sig Dispense Refill  . Calcium Citrate-Vitamin D (CALCIUM + D PO) Take 1 tablet by mouth daily.    [name] EPINEPHRINE 0.3 mg/0.3 mL IJ SOAJ injection INJECT 1 SYRINGE INTO THE MUSCLE ONCE 1 Device 12  . fluticasone (FLONASE) 50 MCG/ACT nasal spray Place  2 sprays into both nostrils daily. 48 g 3  . Fluticasone-Salmeterol (ADVAIR DISKUS) 250-50 MCG/DOSE AEPB INHALE 1 PUFF BY MOUTH ONCE DAILY THEN RINSE MOUTH 60 each 2  . guaiFENesin (MUCINEX) 600 MG 12 hr tablet Take 600 mg by mouth 2 (two) times daily as needed.     [name] ipratropium-albuterol (DUONEB) 0.5-2.5 (3) MG/3ML SOLN USE 1 VIAL VIA NEBULIZER EVERY 6 HOURS AS NEEDED 90 mL 12  . loratadine (CLARITIN) 10 MG tablet Take 10 mg by mouth daily.     [name] losartan-hydrochlorothiazide (HYZAAR) 100-25 MG tablet Take 1 tablet by mouth daily. 90 tablet 3  . metFORMIN (GLUCOPHAGE) 500 MG tablet Take 1 tablet (500 mg total) by mouth 2  (two) times daily with a meal. 60 tablet 0  . montelukast (SINGULAIR) 10 MG tablet TAKE 1 TABLET BY MOUTH AT BEDTIME. 90 tablet 3  . Multiple Vitamin (MULTIVITAMIN PO) Take 1 tablet by mouth daily.     . Nebulizers (COMPRESSOR/NEBULIZER) MISC Use as directed 1 each 0  . VENTOLIN HFA 108 (90 Base) MCG/ACT inhaler INHALE 2 PUFFS BY MOUTH INTO THE LUNGS EVERY 6 HOURS AS NEEDED FOR WHEEZING OR SHORTNESS OF BREATH. 18 g 12  . Vitamin D, Ergocalciferol, (DRISDOL) 1.25 MG (50000 UT) CAPS capsule Take 1 capsule (50,000 Units total) by mouth every 14 (fourteen) days. 2 capsule 0  . promethazine-codeine (PHENERGAN WITH CODEINE) 6.25-10 MG/5ML syrup 5 ml every 6 hours if needed for cough (Patient not taking: Reported on 01/14/2019) 200 mL 0   No facility-administered medications prior to visit.     Review of Systems  Review of Systems  Constitutional: Negative.   HENT: Negative.   Respiratory: Negative for cough, shortness of breath and wheezing.     Physical Exam  BP 122/76 (BP Location: Left Arm, Cuff Size: Normal)   Pulse 73   Ht 5\' 1"  (1.549 m)   Wt 185 lb 3.2 oz (84 kg)   LMP 02/20/2014   SpO2 100%   BMI 34.99 kg/m  Physical Exam Constitutional:      Appearance: Normal appearance. She is normal weight.  HENT:     Head: Normocephalic and atraumatic.     Nose: Nose normal.  Neck:     Musculoskeletal: Normal range of motion and neck supple.  Cardiovascular:     Rate and Rhythm: Normal rate and regular rhythm.  Pulmonary:     Effort: Pulmonary effort is normal.     Breath sounds: Normal breath sounds.  Skin:    General: Skin is warm and dry.  Neurological:     General: No focal deficit present.     Mental Status: She is alert and oriented to person, place, and time. Mental status is at baseline.  Psychiatric:        Mood and Affect: Mood normal.        Behavior: Behavior normal.        Thought Content: Thought content normal.        Judgment: Judgment normal.      Lab  Results:  CBC    Component Value Date/Time   WBC 9.8 07/02/2018 1544   RBC 4.76 07/02/2018 1544   HGB 13.7 07/02/2018 1544   HCT 41.8 07/02/2018 1544   PLT 125.0 (L) 07/02/2018 1544   MCV 87.9 07/02/2018 1544   MCH 28.8 07/07/2017 1207   MCHC 32.8 07/02/2018 1544   RDW 14.0 07/02/2018 1544   LYMPHSABS 2.7 07/02/2018 1544   MONOABS 1.0 07/02/2018 1544  EOSABS 0.2 07/02/2018 1544   BASOSABS 0.1 07/02/2018 1544    BMET    Component Value Date/Time   NA 144 01/14/2019 1118   K 4.3 01/14/2019 1118   CL 103 01/14/2019 1118   CO2 25 01/14/2019 1118   GLUCOSE 100 (H) 01/14/2019 1118   GLUCOSE 80 07/02/2018 1544   BUN 14 01/14/2019 1118   CREATININE 0.52 (L) 01/14/2019 1118   CALCIUM 10.1 01/14/2019 1118   GFRNONAA 107 01/14/2019 1118   GFRAA 124 01/14/2019 1118    BNP No results found for: BNP  ProBNP No results found for: PROBNP  Imaging: No results found.   Assessment & Plan:   Moderate intermittent asthma - Stable - Continue Advair twice daily (refill sent) - Ventolin rescue inhaler 2 puffs every 4-6 hours as needed for breakthrough shortness of breath - Epi-pen as needed for allergic reaction  - FU 6 months with Dr. Annamaria Boots or sooner if needed  Obstructive sleep apnea - She has lost 14-16 lbs with [facility]  - May want to recheck sleep test after reaching weight goal    Martyn Ehrich, NP 03/04/2019

## 2019-03-04 NOTE — Assessment & Plan Note (Signed)
-   She has lost 14-16 lbs with [facility]  - May want to recheck sleep test after reaching weight goal

## 2019-03-04 NOTE — Patient Instructions (Signed)
Great work losing weight Take breaks at work to safely remove mask, use fan and stay hydrated  Recommendations: - Continue Advair twice daily (refill sent) - Ventolin rescue inhaler 2 puffs every 4-6 hours as needed for breakthrough shortness of breath - Epi-pen as needed for allergic reaction   Follow-up - 6 months with Dr. Annamaria Boots or sooner if needed

## 2019-03-04 NOTE — Assessment & Plan Note (Signed)
-   Stable - Continue Advair twice daily (refill sent) - Ventolin rescue inhaler 2 puffs every 4-6 hours as needed for breakthrough shortness of breath - Epi-pen as needed for allergic reaction  - FU 6 months with Dr. Annamaria Boots or sooner if needed

## 2019-03-05 MED FILL — MELOXICAM 15 MG TABLET: 15 | 30 days supply | Qty: 30 | Fill #0

## 2019-03-07 MED FILL — AMOXICILLIN 500 MG CAPSULE: 500 | 5 days supply | Qty: 20 | Fill #0

## 2019-03-13 ENCOUNTER — Encounter (INDEPENDENT_AMBULATORY_CARE_PROVIDER_SITE_OTHER): Payer: Self-pay | Admitting: Family Medicine

## 2019-03-13 ENCOUNTER — Other Ambulatory Visit: Payer: Self-pay

## 2019-03-13 ENCOUNTER — Ambulatory Visit (INDEPENDENT_AMBULATORY_CARE_PROVIDER_SITE_OTHER): Payer: 59 | Admitting: Family Medicine

## 2019-03-13 DIAGNOSIS — E669 Obesity, unspecified: Secondary | ICD-10-CM

## 2019-03-13 DIAGNOSIS — E559 Vitamin D deficiency, unspecified: Secondary | ICD-10-CM

## 2019-03-13 DIAGNOSIS — R7303 Prediabetes: Secondary | ICD-10-CM

## 2019-03-13 DIAGNOSIS — Z6834 Body mass index (BMI) 34.0-34.9, adult: Secondary | ICD-10-CM | POA: Diagnosis not present

## 2019-03-13 MED ORDER — VITAMIN D (ERGOCALCIFEROL) 1.25 MG (50000 UNIT) PO CAPS: 50000.0000 [IU] | ORAL_CAPSULE | ORAL | 0 refills | Status: DC

## 2019-03-13 MED FILL — VIT D2 1.25 MG (50,000 UNIT: 1.25 MG | 28 days supply | Qty: 2 | Fill #0

## 2019-03-14 NOTE — Progress Notes (Signed)
Office: [phone]  /  Fax: [phone] TeleHealth Visit:  Katherine Tanner has verbally consented to this TeleHealth visit today. The patient is located at home, the provider is located at the [facility] office. The participants in this visit include the listed provider and patient and any and all parties involved. The visit was conducted today via FaceTime.  HPI:   Chief Complaint: OBESITY Katherine Tanner is here to discuss her progress with her obesity treatment plan. She is on the Category 2 plan and is following her eating plan approximately 95 % of the time. She states she is walking and cycling 30 to 40 minutes 5 times per week. Katherine Tanner had a weight of 184 pounds this morning. She followed the plan most of the time. She has no obstacles. Katherine Tanner does say she may be interested in other options for a meal plan, but she isn't sure she'd want to follow it daily. We were unable to weigh the patient today for this TeleHealth visit. She feels as if she has lost weight since her last visit. She has lost 16 lbs since starting treatment with [name].  Vitamin D deficiency Katherine Tanner has a diagnosis of vitamin D deficiency. She is currently taking vit D. Katherine Tanner admits fatigue and denies nausea, vomiting or muscle weakness.  Pre-Diabetes Katherine Tanner has a diagnosis of prediabetes based on her elevated Hgb A1c and was informed this puts her at greater risk of developing diabetes. She denies any GI side effects of metformin. She is on metformin two times daily. Katherine Tanner continues to work on diet and exercise to decrease risk of diabetes.   ASSESSMENT AND PLAN:  Vitamin D deficiency - Plan: Vitamin D, Ergocalciferol, (DRISDOL) 1.25 MG (50000 UT) CAPS capsule  PLAN:  Vitamin D Deficiency Aalina was informed that low vitamin D levels contributes to fatigue and are associated with obesity, breast, and colon cancer. Katherine Tanner agrees to continue to take prescription Vit D @50 ,000 IU every other week #2  with no refills and she will follow up for routine testing of vitamin D, at least 2-3 times per year. She was informed of the risk of over-replacement of vitamin D and agrees to not increase her dose unless she discusses this with [name] first. Katherine Tanner agrees to follow up with our clinic in 3 weeks.  Pre-Diabetes Katherine Tanner will continue to work on weight loss, exercise, and decreasing simple carbohydrates in her diet to help decrease the risk of diabetes. We dicussed metformin including benefits and risks. She was informed that eating too many simple carbohydrates or too many calories at one sitting increases the likelihood of GI side effects. Katherine Tanner agrees to continue metformin 500 mg two times daily (no refill needed) and follow up with [name] as directed to monitor her progress.  Obesity Katherine Tanner is currently in the action stage of change. As such, her goal is to continue with weight loss efforts She has agreed to follow the Category 2 plan and follow the Pescatarian eating plan Katherine Tanner has been instructed to work up to a goal of 150 minutes of combined cardio and strengthening exercise per week for weight loss and overall health benefits. We discussed the following Behavioral Modification Strategies today: keeping healthy foods in the home, planning for success, increasing lean protein intake, increasing vegetables and work on meal planning and easy cooking plans  Katherine Tanner has agreed to follow up with our clinic in 3 weeks. She was informed of the importance of frequent follow up visits to maximize her success with  intensive lifestyle modifications for her multiple health conditions.  ALLERGIES: Allergies  Allergen Reactions  . Ace Inhibitors Shortness Of Breath and Cough    Cough/ breathing problems   . Amoxicillin-Pot Clavulanate Swelling    SWELLING REACTION UNSPECIFIED   . Influenza Virus Vacc Split Pf     UNSPECIFIED REACTION   . Shellfish Allergy Itching    MEDICATIONS: Current Outpatient  Medications on File Prior to Visit  Medication Sig Dispense Refill  . albuterol (VENTOLIN HFA) 108 (90 Base) MCG/ACT inhaler INHALE 2 PUFFS BY MOUTH INTO THE LUNGS EVERY 6 HOURS AS NEEDED FOR WHEEZING OR SHORTNESS OF BREATH. 18 g 12  . Calcium Citrate-Vitamin D (CALCIUM + D PO) Take 1 tablet by mouth daily.    Katherine Tanner EPINEPHRINE 0.3 mg/0.3 mL IJ SOAJ injection INJECT 1 SYRINGE INTO THE MUSCLE ONCE 1 each 1  . fluticasone (FLONASE) 50 MCG/ACT nasal spray Place 2 sprays into both nostrils daily. 48 g 3  . Fluticasone-Salmeterol (ADVAIR DISKUS) 250-50 MCG/DOSE AEPB INHALE 1 PUFF BY MOUTH ONCE DAILY THEN RINSE MOUTH 60 each 2  . guaiFENesin (MUCINEX) 600 MG 12 hr tablet Take 600 mg by mouth 2 (two) times daily as needed.     Katherine Tanner ipratropium-albuterol (DUONEB) 0.5-2.5 (3) MG/3ML SOLN USE 1 VIAL VIA NEBULIZER EVERY 6 HOURS AS NEEDED 90 mL 12  . loratadine (CLARITIN) 10 MG tablet Take 10 mg by mouth daily.     Katherine Tanner losartan-hydrochlorothiazide (HYZAAR) 100-25 MG tablet Take 1 tablet by mouth daily. 90 tablet 3  . metFORMIN (GLUCOPHAGE) 500 MG tablet Take 1 tablet (500 mg total) by mouth 2 (two) times daily with a meal. 60 tablet 0  . montelukast (SINGULAIR) 10 MG tablet Take 1 tablet (10 mg total) by mouth at bedtime. 90 tablet 3  . Multiple Vitamin (MULTIVITAMIN PO) Take 1 tablet by mouth daily.     . Nebulizers (COMPRESSOR/NEBULIZER) MISC Use as directed 1 each 0  . promethazine-codeine (PHENERGAN WITH CODEINE) 6.25-10 MG/5ML syrup 5 ml every 6 hours if needed for cough (Patient not taking: Reported on 01/14/2019) 200 mL 0   No current facility-administered medications on file prior to visit.     PAST MEDICAL HISTORY: Past Medical History:  Diagnosis Date  . Allergy    takes Claritin daily and Flonase daily  . Arthritis   . Asthma    uses Albuterol daily as needed;Singulair nightly;DUlera daily  . Back pain   . Constipation   . Fatty liver   . Food allergy   . GERD (gastroesophageal reflux disease)     occasionally will take OTC meds but states she just watches what she eats  . H/O hiatal hernia   . History of bronchitis    last time 51yrs ago  . History of colon polyps   . History of kidney stones   . History of shingles   . Hyperlipidemia   . Hypertension    takes Hyzaar daily  . Joint pain   . Joint pain   . Joint swelling   . Obesity   . Osteoarthritis   . Pneumonia    hx of;last time at least 40yrs ago  . PONV (postoperative nausea and vomiting)   . Prediabetes   . Shortness of breath   . Sleep apnea   . Swelling of lower extremity   . Trouble in sleeping   . Wheezing     PAST SURGICAL HISTORY: Past Surgical History:  Procedure Laterality Date  . BREAST EXCISIONAL BIOPSY  Right   . CARDIAC CATHETERIZATION    . COLONOSCOPY    . ESOPHAGOGASTRODUODENOSCOPY    . KNEE SURGERY Right    arthroscopy  . Right ganglion cyst     x 2  . right lumpectomy  around 1983  . STERIOD INJECTION Left 11/15/2013   Procedure: Marcaine/STEROID INJECTION;  Surgeon: Alta Corning, MD;  Location: [facility];  Service: Orthopedics;  Laterality: Left;  . TOTAL KNEE ARTHROPLASTY Right 11/15/2013   Procedure: RIGHT TOTAL KNEE ARTHROPLASTY;  Surgeon: Alta Corning, MD;  Location: [facility];  Service: Orthopedics;  Laterality: Right;  . TOTAL KNEE ARTHROPLASTY Left 12/30/2016   Procedure: TOTAL KNEE ARTHROPLASTY;  Surgeon: Dorna Leitz, MD;  Location: [facility];  Service: Orthopedics;  Laterality: Left;  . TUBAL LIGATION      SOCIAL HISTORY: Social History   Tobacco Use  . Smoking status: Never Smoker  . Smokeless tobacco: Never Used  Substance Use Topics  . Alcohol use: No    Alcohol/week: 0.0 standard drinks  . Drug use: No    FAMILY HISTORY: Family History  Problem Relation Age of Onset  . Diabetes Father   . Cancer Father   . Liver disease Father   . Alcoholism Father   . Sarcoidosis Mother   . Glaucoma Mother   . Hypertension Mother   . Hyperlipidemia Mother   . Sleep apnea Mother    . Obesity Mother   . Breast cancer Neg Hx     ROS: Review of Systems  Constitutional: Positive for malaise/fatigue and weight loss.  Gastrointestinal: Negative for diarrhea, nausea and vomiting.  Musculoskeletal:       Negative for muscle weakness    PHYSICAL EXAM: Pt in no acute distress  RECENT LABS AND TESTS: BMET    Component Value Date/Time   NA 144 01/14/2019 1118   K 4.3 01/14/2019 1118   CL 103 01/14/2019 1118   CO2 25 01/14/2019 1118   GLUCOSE 100 (H) 01/14/2019 1118   GLUCOSE 80 07/02/2018 1544   BUN 14 01/14/2019 1118   CREATININE 0.52 (L) 01/14/2019 1118   CALCIUM 10.1 01/14/2019 1118   GFRNONAA 107 01/14/2019 1118   GFRAA 124 01/14/2019 1118   Lab Results  Component Value Date   HGBA1C 5.9 (H) 01/14/2019   HGBA1C 5.9 (H) 08/30/2018   HGBA1C 6.1 06/08/2017   HGBA1C 6.1 06/15/2016   HGBA1C 6.1 06/05/2015   Lab Results  Component Value Date   INSULIN 8.8 01/14/2019   INSULIN 9.3 08/30/2018   CBC    Component Value Date/Time   WBC 9.8 07/02/2018 1544   RBC 4.76 07/02/2018 1544   HGB 13.7 07/02/2018 1544   HCT 41.8 07/02/2018 1544   PLT 125.0 (L) 07/02/2018 1544   MCV 87.9 07/02/2018 1544   MCH 28.8 07/07/2017 1207   MCHC 32.8 07/02/2018 1544   RDW 14.0 07/02/2018 1544   LYMPHSABS 2.7 07/02/2018 1544   MONOABS 1.0 07/02/2018 1544   EOSABS 0.2 07/02/2018 1544   BASOSABS 0.1 07/02/2018 1544   Iron/TIBC/Ferritin/ %Sat No results found for: IRON, TIBC, FERRITIN, IRONPCTSAT Lipid Panel     Component Value Date/Time   CHOL 194 10/04/2018 1457   TRIG 69.0 10/04/2018 1457   HDL 51.00 10/04/2018 1457   CHOLHDL 4 10/04/2018 1457   VLDL 13.8 10/04/2018 1457   LDLCALC 129 (H) 10/04/2018 1457   LDLDIRECT 147.9 12/17/2012 1604   Hepatic Function Panel     Component Value Date/Time   PROT 7.1 01/14/2019  1118   ALBUMIN 4.5 01/14/2019 1118   AST 31 01/14/2019 1118   ALT 54 (H) 01/14/2019 1118   ALKPHOS 172 (H) 01/14/2019 1118   BILITOT 0.2  01/14/2019 1118   BILIDIR 0.1 01/02/2012 1626   IBILI 0.3 01/05/2010 2038      Component Value Date/Time   TSH 2.36 07/02/2018 1544   TSH 1.26 06/08/2017 1457   TSH 0.93 06/15/2016 1359     Ref. Range 01/14/2019 11:18  Vitamin D, 25-Hydroxy Latest Ref Range: 30.0 - 100.0 ng/mL 53.3    I, Doreene Nest, am acting as Location manager for Eber Jones, MD  I have reviewed the above documentation for accuracy and completeness, and I agree with the above. - Ilene Qua, MD

## 2019-03-25 MED FILL — LOSARTAN-HCTZ 100-25 MG TAB: 100-25 | 30 days supply | Qty: 30 | Fill #8

## 2019-03-26 MED FILL — ADVAIR 250/50 DISKUS: 250-50 | 60 days supply | Qty: 60 | Fill #2

## 2019-04-01 ENCOUNTER — Ambulatory Visit (INDEPENDENT_AMBULATORY_CARE_PROVIDER_SITE_OTHER): Payer: 59 | Admitting: Family Medicine

## 2019-04-01 ENCOUNTER — Other Ambulatory Visit: Payer: Self-pay

## 2019-04-01 VITALS — BP 125/64 | HR 73 | Temp 98.2°F | Ht 61.0 in | Wt 180.0 lb

## 2019-04-01 DIAGNOSIS — R7303 Prediabetes: Secondary | ICD-10-CM | POA: Diagnosis not present

## 2019-04-01 DIAGNOSIS — Z6834 Body mass index (BMI) 34.0-34.9, adult: Secondary | ICD-10-CM

## 2019-04-01 DIAGNOSIS — E559 Vitamin D deficiency, unspecified: Secondary | ICD-10-CM | POA: Diagnosis not present

## 2019-04-01 DIAGNOSIS — Z9189 Other specified personal risk factors, not elsewhere classified: Secondary | ICD-10-CM | POA: Diagnosis not present

## 2019-04-01 DIAGNOSIS — E669 Obesity, unspecified: Secondary | ICD-10-CM | POA: Diagnosis not present

## 2019-04-01 MED ORDER — METFORMIN HCL 500 MG PO TABS: 500.0000 mg | ORAL_TABLET | Freq: Two times a day (BID) | ORAL | 0 refills | Status: DC

## 2019-04-01 MED ORDER — VITAMIN D (ERGOCALCIFEROL) 1.25 MG (50000 UNIT) PO CAPS: 50000.0000 [IU] | ORAL_CAPSULE | ORAL | 0 refills | Status: DC

## 2019-04-01 MED FILL — metFORMIN HCL 500 MG TABS: 500 | 30 days supply | Qty: 60 | Fill #0

## 2019-04-01 NOTE — Progress Notes (Signed)
Office: [phone]  /  Fax: [phone]   HPI:   Chief Complaint: OBESITY Astaria is here to discuss her progress with her obesity treatment plan. She is on the Category 2 plan and is following her eating plan approximately 90 % of the time. She states she is walking, cycling, and using resistance bands 30 minutes 5 times per week. Charleigh's last 2 weeks have been pretty good, but she has been fairly spot on with the meal plan. She is looking for different options in terms of breakfast. She is taking a week off at the end of October.  Her weight is 180 lb (81.6 kg) today and has had a weight loss of 2 pounds over a period of 5 weeks since her last visit. She has lost 18 lbs since starting treatment with [name].  Vitamin D Deficiency Nyalee has a diagnosis of vitamin D deficiency. She is currently on vit D. Pualani admits fatigue and denies nausea, vomiting, or muscle weakness.  Pre-Diabetes Caty has a diagnosis of pre-diabetes based on her elevated Hgb A1c and was informed this puts her at greater risk of developing diabetes. She is taking metformin currently and notes carb cravings. She continues to work on diet and exercise to decrease risk of diabetes. She denies any GI side effects of metformin.  At risk for diabetes Kenyata is at higher than average risk for developing diabetes due to her pre-diabetes and obesity.   ASSESSMENT AND PLAN:  Vitamin D deficiency - Plan: Vitamin D, Ergocalciferol, (DRISDOL) 1.25 MG (50000 UT) CAPS capsule  Prediabetes - Plan: metFORMIN (GLUCOPHAGE) 500 MG tablet  At risk for diabetes mellitus  Class 1 obesity with serious comorbidity and body mass index (BMI) of 34.0 to 34.9 in adult, unspecified obesity type  PLAN:  Vitamin D Deficiency Jolie was informed that low vitamin D levels contribute to fatigue and are associated with obesity, breast, and colon cancer. Analiz agrees to continue to take prescription Vit D @50 ,000 IU every week #4 with no  refills and will follow up for routine testing of vitamin D, at least 2-3 times per year. She was informed of the risk of over-replacement of vitamin D and agrees to not increase her dose unless she discusses this with [name] first. Joumana agrees to follow up in 2 weeks as directed.  Pre-Diabetes Remas will continue to work on weight loss, exercise, and decreasing simple carbohydrates in her diet to help decrease the risk of diabetes. She was informed that eating too many simple carbohydrates or too many calories at one sitting increases the likelihood of GI side effects. Evalynne agreed to continue metformin 500 mg BID #60 with no refills and a prescription was written today. Erinne agreed to follow up with [name] as directed to monitor her progress in 2 weeks.   Diabetes risk counseling Molly was given extended (15 minutes) diabetes prevention counseling today. She is 58 y.o. female and has risk factors for diabetes including pre-diabetes and obesity. We discussed intensive lifestyle modifications today with an emphasis on weight loss as well as increasing exercise and decreasing simple carbohydrates in her diet.  Obesity Euella is currently in the action stage of change. As such, her goal is to continue with weight loss efforts. She has agreed to follow the Category 2 plan. Ahslee has been instructed to work up to a goal of 150 minutes of combined cardio and strengthening exercise per week for weight loss and overall health benefits. We discussed the following Behavioral Modification Strategies  today: increasing lean protein intake, increasing vegetables, keeping healthy foods in the home, planning for success, and work on meal planning and easy cooking plans.  Icela has agreed to follow up with our clinic in 2 weeks. She was informed of the importance of frequent follow up visits to maximize her success with intensive lifestyle modifications for her multiple health conditions.  ALLERGIES: Allergies   Allergen Reactions  . Ace Inhibitors Shortness Of Breath and Cough    Cough/ breathing problems   . Amoxicillin-Pot Clavulanate Swelling    SWELLING REACTION UNSPECIFIED   . Influenza Virus Vacc Split Pf     UNSPECIFIED REACTION   . Shellfish Allergy Itching    MEDICATIONS: Current Outpatient Medications on File Prior to Visit  Medication Sig Dispense Refill  . albuterol (VENTOLIN HFA) 108 (90 Base) MCG/ACT inhaler INHALE 2 PUFFS BY MOUTH INTO THE LUNGS EVERY 6 HOURS AS NEEDED FOR WHEEZING OR SHORTNESS OF BREATH. 18 g 12  . Calcium Citrate-Vitamin D (CALCIUM + D PO) Take 1 tablet by mouth daily.    [name] EPINEPHRINE 0.3 mg/0.3 mL IJ SOAJ injection INJECT 1 SYRINGE INTO THE MUSCLE ONCE 1 each 1  . fluticasone (FLONASE) 50 MCG/ACT nasal spray Place 2 sprays into both nostrils daily. 48 g 3  . Fluticasone-Salmeterol (ADVAIR DISKUS) 250-50 MCG/DOSE AEPB INHALE 1 PUFF BY MOUTH ONCE DAILY THEN RINSE MOUTH 60 each 2  . ipratropium-albuterol (DUONEB) 0.5-2.5 (3) MG/3ML SOLN USE 1 VIAL VIA NEBULIZER EVERY 6 HOURS AS NEEDED 90 mL 12  . loratadine (CLARITIN) 10 MG tablet Take 10 mg by mouth daily.     [name] losartan-hydrochlorothiazide (HYZAAR) 100-25 MG tablet Take 1 tablet by mouth daily. 90 tablet 3  . montelukast (SINGULAIR) 10 MG tablet Take 1 tablet (10 mg total) by mouth at bedtime. 90 tablet 3  . Multiple Vitamin (MULTIVITAMIN PO) Take 1 tablet by mouth daily.     . Nebulizers (COMPRESSOR/NEBULIZER) MISC Use as directed 1 each 0  . guaiFENesin (MUCINEX) 600 MG 12 hr tablet Take 600 mg by mouth 2 (two) times daily as needed.     . promethazine-codeine (PHENERGAN WITH CODEINE) 6.25-10 MG/5ML syrup 5 ml every 6 hours if needed for cough (Patient not taking: Reported on 01/14/2019) 200 mL 0   No current facility-administered medications on file prior to visit.     PAST MEDICAL HISTORY: Past Medical History:  Diagnosis Date  . Allergy    takes Claritin daily and Flonase daily  . Arthritis   .  Asthma    uses Albuterol daily as needed;Singulair nightly;DUlera daily  . Back pain   . Constipation   . Fatty liver   . Food allergy   . GERD (gastroesophageal reflux disease)    occasionally will take OTC meds but states she just watches what she eats  . H/O hiatal hernia   . History of bronchitis    last time 66yrs ago  . History of colon polyps   . History of kidney stones   . History of shingles   . Hyperlipidemia   . Hypertension    takes Hyzaar daily  . Joint pain   . Joint pain   . Joint swelling   . Obesity   . Osteoarthritis   . Pneumonia    hx of;last time at least 64yrs ago  . PONV (postoperative nausea and vomiting)   . Prediabetes   . Shortness of breath   . Sleep apnea   . Swelling of lower extremity   .  Trouble in sleeping   . Wheezing     PAST SURGICAL HISTORY: Past Surgical History:  Procedure Laterality Date  . BREAST EXCISIONAL BIOPSY Right   . CARDIAC CATHETERIZATION    . COLONOSCOPY    . ESOPHAGOGASTRODUODENOSCOPY    . KNEE SURGERY Right    arthroscopy  . Right ganglion cyst     x 2  . right lumpectomy  around 1983  . STERIOD INJECTION Left 11/15/2013   Procedure: Marcaine/STEROID INJECTION;  Surgeon: Alta Corning, MD;  Location: [facility];  Service: Orthopedics;  Laterality: Left;  . TOTAL KNEE ARTHROPLASTY Right 11/15/2013   Procedure: RIGHT TOTAL KNEE ARTHROPLASTY;  Surgeon: Alta Corning, MD;  Location: [facility];  Service: Orthopedics;  Laterality: Right;  . TOTAL KNEE ARTHROPLASTY Left 12/30/2016   Procedure: TOTAL KNEE ARTHROPLASTY;  Surgeon: Dorna Leitz, MD;  Location: [facility];  Service: Orthopedics;  Laterality: Left;  . TUBAL LIGATION      SOCIAL HISTORY: Social History   Tobacco Use  . Smoking status: Never Smoker  . Smokeless tobacco: Never Used  Substance Use Topics  . Alcohol use: No    Alcohol/week: 0.0 standard drinks  . Drug use: No    FAMILY HISTORY: Family History  Problem Relation Age of Onset  . Diabetes Father   .  Cancer Father   . Liver disease Father   . Alcoholism Father   . Sarcoidosis Mother   . Glaucoma Mother   . Hypertension Mother   . Hyperlipidemia Mother   . Sleep apnea Mother   . Obesity Mother   . Breast cancer Neg Hx     ROS: Review of Systems  Constitutional: Positive for malaise/fatigue and weight loss.  Gastrointestinal: Negative for nausea and vomiting.  Musculoskeletal:       Negative for muscle weakness.    PHYSICAL EXAM: Blood pressure 125/64, pulse 73, temperature 98.2 F (36.8 C), temperature source Oral, height 5\' 1"  (1.549 m), weight 180 lb (81.6 kg), last menstrual period 02/20/2014, SpO2 99 %. Body mass index is 34.01 kg/m. Physical Exam Vitals signs reviewed.  Constitutional:      Appearance: Normal appearance. She is obese.  Cardiovascular:     Rate and Rhythm: Normal rate.  Pulmonary:     Effort: Pulmonary effort is normal.  Musculoskeletal: Normal range of motion.  Skin:    General: Skin is warm and dry.  Neurological:     Mental Status: She is alert and oriented to person, place, and time.  Psychiatric:        Mood and Affect: Mood normal.        Behavior: Behavior normal.     RECENT LABS AND TESTS: BMET    Component Value Date/Time   NA 144 01/14/2019 1118   K 4.3 01/14/2019 1118   CL 103 01/14/2019 1118   CO2 25 01/14/2019 1118   GLUCOSE 100 (H) 01/14/2019 1118   GLUCOSE 80 07/02/2018 1544   BUN 14 01/14/2019 1118   CREATININE 0.52 (L) 01/14/2019 1118   CALCIUM 10.1 01/14/2019 1118   GFRNONAA 107 01/14/2019 1118   GFRAA 124 01/14/2019 1118   Lab Results  Component Value Date   HGBA1C 5.9 (H) 01/14/2019   HGBA1C 5.9 (H) 08/30/2018   HGBA1C 6.1 06/08/2017   HGBA1C 6.1 06/15/2016   HGBA1C 6.1 06/05/2015   Lab Results  Component Value Date   INSULIN 8.8 01/14/2019   INSULIN 9.3 08/30/2018   CBC    Component Value Date/Time  WBC 9.8 07/02/2018 1544   RBC 4.76 07/02/2018 1544   HGB 13.7 07/02/2018 1544   HCT 41.8  07/02/2018 1544   PLT 125.0 (L) 07/02/2018 1544   MCV 87.9 07/02/2018 1544   MCH 28.8 07/07/2017 1207   MCHC 32.8 07/02/2018 1544   RDW 14.0 07/02/2018 1544   LYMPHSABS 2.7 07/02/2018 1544   MONOABS 1.0 07/02/2018 1544   EOSABS 0.2 07/02/2018 1544   BASOSABS 0.1 07/02/2018 1544   Iron/TIBC/Ferritin/ %Sat No results found for: IRON, TIBC, FERRITIN, IRONPCTSAT Lipid Panel     Component Value Date/Time   CHOL 194 10/04/2018 1457   TRIG 69.0 10/04/2018 1457   HDL 51.00 10/04/2018 1457   CHOLHDL 4 10/04/2018 1457   VLDL 13.8 10/04/2018 1457   LDLCALC 129 (H) 10/04/2018 1457   LDLDIRECT 147.9 12/17/2012 1604   Hepatic Function Panel     Component Value Date/Time   PROT 7.1 01/14/2019 1118   ALBUMIN 4.5 01/14/2019 1118   AST 31 01/14/2019 1118   ALT 54 (H) 01/14/2019 1118   ALKPHOS 172 (H) 01/14/2019 1118   BILITOT 0.2 01/14/2019 1118   BILIDIR 0.1 01/02/2012 1626   IBILI 0.3 01/05/2010 2038      Component Value Date/Time   TSH 2.36 07/02/2018 1544   TSH 1.26 06/08/2017 1457   TSH 0.93 06/15/2016 1359   Results for SHANESE, BENKOWSKI (MRN [id number]) as of 04/01/2019 16:11  Ref. Range 01/14/2019 11:18  Vitamin D, 25-Hydroxy Latest Ref Range: 30.0 - 100.0 ng/mL 53.3     OBESITY BEHAVIORAL INTERVENTION VISIT  Today's visit was # 14  Starting weight: 198 lbs Starting date: 08/30/18 Today's weight : Weight: 180 lb (81.6 kg)  Today's date: 04/01/2019 Total lbs lost to date: 18 lbs    04/01/2019  Height 5\' 1"  (1.549 m)  Weight 180 lb (81.6 kg)  BMI (Calculated) 34.03  BLOOD PRESSURE - SYSTOLIC 0000000  BLOOD PRESSURE - DIASTOLIC 64   Body Fat % 123456 %  Total Body Water (lbs) 70.2 lbs    ASK: We discussed the diagnosis of obesity with Naylin Trollinger-Smith today and Batul agreed to give [name] permission to discuss obesity behavioral modification therapy today.  ASSESS: Gustavia has the diagnosis of obesity and her BMI today is 34.03. Bibian is in the action stage  of change.   ADVISE: Keonda was educated on the multiple health risks of obesity as well as the benefit of weight loss to improve her health. She was advised of the need for long term treatment and the importance of lifestyle modifications to improve her current health and to decrease her risk of future health problems.  AGREE: Multiple dietary modification options and treatment options were discussed and Rheanne agreed to follow the recommendations documented in the above note.  ARRANGE: Ercia was educated on the importance of frequent visits to treat obesity as outlined per CMS and USPSTF guidelines and agreed to schedule her next follow up appointment today.  Lenward Chancellor, CMA, am acting as transcriptionist for Eber Jones, MD  I have reviewed the above documentation for accuracy and completeness, and I agree with the above. - Ilene Qua, MD

## 2019-04-04 MED FILL — VIT D2 1.25 MG (50,000 UNIT: 1.25 MG | 28 days supply | Qty: 2 | Fill #0

## 2019-04-15 ENCOUNTER — Ambulatory Visit (INDEPENDENT_AMBULATORY_CARE_PROVIDER_SITE_OTHER): Payer: 59 | Admitting: Family Medicine

## 2019-04-15 ENCOUNTER — Other Ambulatory Visit: Payer: Self-pay

## 2019-04-15 ENCOUNTER — Encounter (INDEPENDENT_AMBULATORY_CARE_PROVIDER_SITE_OTHER): Payer: Self-pay | Admitting: Family Medicine

## 2019-04-15 VITALS — BP 129/72 | HR 78 | Ht 61.0 in | Wt 179.0 lb

## 2019-04-15 DIAGNOSIS — K5909 Other constipation: Secondary | ICD-10-CM

## 2019-04-15 DIAGNOSIS — Z9189 Other specified personal risk factors, not elsewhere classified: Secondary | ICD-10-CM

## 2019-04-15 DIAGNOSIS — Z6833 Body mass index (BMI) 33.0-33.9, adult: Secondary | ICD-10-CM

## 2019-04-15 DIAGNOSIS — E669 Obesity, unspecified: Secondary | ICD-10-CM | POA: Diagnosis not present

## 2019-04-15 DIAGNOSIS — R7303 Prediabetes: Secondary | ICD-10-CM | POA: Diagnosis not present

## 2019-04-16 NOTE — Progress Notes (Signed)
Office: [phone]  /  Fax: [phone]   HPI:   Chief Complaint: OBESITY Katherine Tanner is here to discuss her progress with her obesity treatment plan. She is on the Category 2 plan and is following her eating plan approximately 90 % of the time. She states she is walking, doing resistance bands and cycling 30 minutes 5 times per week. Katherine Tanner is having kodiak oatmeal with an egg for breakfast. Her weight is 179 lb (81.2 kg) today and has had a weight loss of 1 pound over a period of 2 weeks since her last visit. She has lost 19 lbs since starting treatment with [name].  Constipation Katherine Tanner is on Miralax every other day for constipation. She has no blood in her stool and she denies hemorrhoids.     Pre-Diabetes Katherine Tanner has a diagnosis of prediabetes based on her elevated Hgb A1c and was informed this puts her at greater risk of developing diabetes. Katherine Tanner is on Metformin twice daily and she continues to work on diet and exercise to decrease risk of diabetes. She denies polyphagia.  At risk for diabetes Katherine Tanner is at higher than average risk for developing diabetes due to her obesity and prediabetes. She currently denies polyuria or polydipsia.  ASSESSMENT AND PLAN:  Other constipation  Prediabetes  At risk for diabetes mellitus  Class 1 obesity with serious comorbidity and body mass index (BMI) of 33.0 to 33.9 in adult, unspecified obesity type  PLAN:  Constipation [name] was informed decrease bowel movement frequency is normal while losing weight, but stools should not be hard or painful. She was advised to increase her H20 intake. [name] agrees to increase Miralax to daily and follow up as directed.  Pre-Diabetes Katherine Tanner will continue to work on weight loss, exercise, and decreasing simple carbohydrates in her diet to help decrease the risk of diabetes. She was informed that eating too many simple carbohydrates or too many calories at one sitting increases the likelihood of GI side  effects. Katherine Tanner will continue metformin and follow up with [name] as directed to monitor her progress.  Diabetes risk counseling Katherine Tanner was given extended (15 minutes) diabetes prevention counseling today. She is 58 y.o. female and has risk factors for diabetes including obesity and prediabetes. We discussed intensive lifestyle modifications today with an emphasis on weight loss as well as increasing exercise and decreasing simple carbohydrates in her diet.  Obesity Katherine Tanner is currently in the action stage of change. As such, her goal is to continue with weight loss efforts She has agreed to follow the Category 2 plan Katherine Tanner will continue her current exercise regimen for weight loss and overall health benefits. We discussed the following Behavioral Modification Strategies today: planning for success and increasing lean protein intake  Handouts for chicken soup recipe, recipes and lunch options were given to patient today.  Katherine Tanner has agreed to follow up with our clinic in 3 weeks. She was informed of the importance of frequent follow up visits to maximize her success with intensive lifestyle modifications for her multiple health conditions.  ALLERGIES: Allergies  Allergen Reactions  . Ace Inhibitors Shortness Of Breath and Cough    Cough/ breathing problems   . Amoxicillin-Pot Clavulanate Swelling    SWELLING REACTION UNSPECIFIED   . Influenza Virus Vacc Split Pf     UNSPECIFIED REACTION   . Shellfish Allergy Itching    MEDICATIONS: Current Outpatient Medications on File Prior to Visit  Medication Sig Dispense Refill  . albuterol (VENTOLIN HFA) 108 (90 Base) MCG/ACT  inhaler INHALE 2 PUFFS BY MOUTH INTO THE LUNGS EVERY 6 HOURS AS NEEDED FOR WHEEZING OR SHORTNESS OF BREATH. 18 g 12  . Calcium Citrate-Vitamin D (CALCIUM + D PO) Take 1 tablet by mouth daily.    [name] EPINEPHRINE 0.3 mg/0.3 mL IJ SOAJ injection INJECT 1 SYRINGE INTO THE MUSCLE ONCE 1 each 1  . fluticasone (FLONASE) 50 MCG/ACT  nasal spray Place 2 sprays into both nostrils daily. 48 g 3  . Fluticasone-Salmeterol (ADVAIR DISKUS) 250-50 MCG/DOSE AEPB INHALE 1 PUFF BY MOUTH ONCE DAILY THEN RINSE MOUTH 60 each 2  . guaiFENesin (MUCINEX) 600 MG 12 hr tablet Take 600 mg by mouth 2 (two) times daily as needed.     [name] ipratropium-albuterol (DUONEB) 0.5-2.5 (3) MG/3ML SOLN USE 1 VIAL VIA NEBULIZER EVERY 6 HOURS AS NEEDED 90 mL 12  . loratadine (CLARITIN) 10 MG tablet Take 10 mg by mouth daily.     [name] losartan-hydrochlorothiazide (HYZAAR) 100-25 MG tablet Take 1 tablet by mouth daily. 90 tablet 3  . metFORMIN (GLUCOPHAGE) 500 MG tablet Take 1 tablet (500 mg total) by mouth 2 (two) times daily with a meal. 60 tablet 0  . montelukast (SINGULAIR) 10 MG tablet Take 1 tablet (10 mg total) by mouth at bedtime. 90 tablet 3  . Multiple Vitamin (MULTIVITAMIN PO) Take 1 tablet by mouth daily.     . Nebulizers (COMPRESSOR/NEBULIZER) MISC Use as directed 1 each 0  . Vitamin D, Ergocalciferol, (DRISDOL) 1.25 MG (50000 UT) CAPS capsule Take 1 capsule (50,000 Units total) by mouth every 14 (fourteen) days. 2 capsule 0  . promethazine-codeine (PHENERGAN WITH CODEINE) 6.25-10 MG/5ML syrup 5 ml every 6 hours if needed for cough (Patient not taking: Reported on 01/14/2019) 200 mL 0   No current facility-administered medications on file prior to visit.     PAST MEDICAL HISTORY: Past Medical History:  Diagnosis Date  . Allergy    takes Claritin daily and Flonase daily  . Arthritis   . Asthma    uses Albuterol daily as needed;Singulair nightly;DUlera daily  . Back pain   . Constipation   . Fatty liver   . Food allergy   . GERD (gastroesophageal reflux disease)    occasionally will take OTC meds but states she just watches what she eats  . H/O hiatal hernia   . History of bronchitis    last time 72yrs ago  . History of colon polyps   . History of kidney stones   . History of shingles   . Hyperlipidemia   . Hypertension    takes Hyzaar  daily  . Joint pain   . Joint pain   . Joint swelling   . Obesity   . Osteoarthritis   . Pneumonia    hx of;last time at least 50yrs ago  . PONV (postoperative nausea and vomiting)   . Prediabetes   . Shortness of breath   . Sleep apnea   . Swelling of lower extremity   . Trouble in sleeping   . Wheezing     PAST SURGICAL HISTORY: Past Surgical History:  Procedure Laterality Date  . BREAST EXCISIONAL BIOPSY Right   . CARDIAC CATHETERIZATION    . COLONOSCOPY    . ESOPHAGOGASTRODUODENOSCOPY    . KNEE SURGERY Right    arthroscopy  . Right ganglion cyst     x 2  . right lumpectomy  around 1983  . STERIOD INJECTION Left 11/15/2013   Procedure: Marcaine/STEROID INJECTION;  Surgeon: Karen Chafe  Berenice Primas, MD;  Location: [facility];  Service: Orthopedics;  Laterality: Left;  . TOTAL KNEE ARTHROPLASTY Right 11/15/2013   Procedure: RIGHT TOTAL KNEE ARTHROPLASTY;  Surgeon: Alta Corning, MD;  Location: [facility];  Service: Orthopedics;  Laterality: Right;  . TOTAL KNEE ARTHROPLASTY Left 12/30/2016   Procedure: TOTAL KNEE ARTHROPLASTY;  Surgeon: Dorna Leitz, MD;  Location: [facility];  Service: Orthopedics;  Laterality: Left;  . TUBAL LIGATION      SOCIAL HISTORY: Social History   Tobacco Use  . Smoking status: Never Smoker  . Smokeless tobacco: Never Used  Substance Use Topics  . Alcohol use: No    Alcohol/week: 0.0 standard drinks  . Drug use: No    FAMILY HISTORY: Family History  Problem Relation Age of Onset  . Diabetes Father   . Cancer Father   . Liver disease Father   . Alcoholism Father   . Sarcoidosis Mother   . Glaucoma Mother   . Hypertension Mother   . Hyperlipidemia Mother   . Sleep apnea Mother   . Obesity Mother   . Breast cancer Neg Hx     ROS: Review of Systems  Constitutional: Positive for weight loss.  Gastrointestinal: Positive for constipation.       Negative for hematochezia Negative for hemorrhoids  Genitourinary: Negative for frequency.   Endo/Heme/Allergies: Negative for polydipsia.       Negative for polyphagia    PHYSICAL EXAM: Blood pressure 129/72, pulse 78, height 5\' 1"  (1.549 m), weight 179 lb (81.2 kg), last menstrual period 02/20/2014, SpO2 100 %. Body mass index is 33.82 kg/m. Physical Exam Vitals signs reviewed.  Constitutional:      Appearance: Normal appearance. She is well-developed. She is obese.  Cardiovascular:     Rate and Rhythm: Normal rate.  Pulmonary:     Effort: Pulmonary effort is normal.  Musculoskeletal: Normal range of motion.  Skin:    General: Skin is warm and dry.  Neurological:     Mental Status: She is alert and oriented to person, place, and time.  Psychiatric:        Mood and Affect: Mood normal.        Behavior: Behavior normal.     RECENT LABS AND TESTS: BMET    Component Value Date/Time   NA 144 01/14/2019 1118   K 4.3 01/14/2019 1118   CL 103 01/14/2019 1118   CO2 25 01/14/2019 1118   GLUCOSE 100 (H) 01/14/2019 1118   GLUCOSE 80 07/02/2018 1544   BUN 14 01/14/2019 1118   CREATININE 0.52 (L) 01/14/2019 1118   CALCIUM 10.1 01/14/2019 1118   GFRNONAA 107 01/14/2019 1118   GFRAA 124 01/14/2019 1118   Lab Results  Component Value Date   HGBA1C 5.9 (H) 01/14/2019   HGBA1C 5.9 (H) 08/30/2018   HGBA1C 6.1 06/08/2017   HGBA1C 6.1 06/15/2016   HGBA1C 6.1 06/05/2015   Lab Results  Component Value Date   INSULIN 8.8 01/14/2019   INSULIN 9.3 08/30/2018   CBC    Component Value Date/Time   WBC 9.8 07/02/2018 1544   RBC 4.76 07/02/2018 1544   HGB 13.7 07/02/2018 1544   HCT 41.8 07/02/2018 1544   PLT 125.0 (L) 07/02/2018 1544   MCV 87.9 07/02/2018 1544   MCH 28.8 07/07/2017 1207   MCHC 32.8 07/02/2018 1544   RDW 14.0 07/02/2018 1544   LYMPHSABS 2.7 07/02/2018 1544   MONOABS 1.0 07/02/2018 1544   EOSABS 0.2 07/02/2018 1544   BASOSABS 0.1 07/02/2018  1544   Iron/TIBC/Ferritin/ %Sat No results found for: IRON, TIBC, FERRITIN, IRONPCTSAT Lipid Panel      Component Value Date/Time   CHOL 194 10/04/2018 1457   TRIG 69.0 10/04/2018 1457   HDL 51.00 10/04/2018 1457   CHOLHDL 4 10/04/2018 1457   VLDL 13.8 10/04/2018 1457   LDLCALC 129 (H) 10/04/2018 1457   LDLDIRECT 147.9 12/17/2012 1604   Hepatic Function Panel     Component Value Date/Time   PROT 7.1 01/14/2019 1118   ALBUMIN 4.5 01/14/2019 1118   AST 31 01/14/2019 1118   ALT 54 (H) 01/14/2019 1118   ALKPHOS 172 (H) 01/14/2019 1118   BILITOT 0.2 01/14/2019 1118   BILIDIR 0.1 01/02/2012 1626   IBILI 0.3 01/05/2010 2038      Component Value Date/Time   TSH 2.36 07/02/2018 1544   TSH 1.26 06/08/2017 1457   TSH 0.93 06/15/2016 1359     Ref. Range 01/14/2019 11:18  Vitamin D, 25-Hydroxy Latest Ref Range: 30.0 - 100.0 ng/mL 53.3    OBESITY BEHAVIORAL INTERVENTION VISIT  Today's visit was # 15   Starting weight: 198 lbs Starting date: 08/30/2018 Today's weight : 179 lbs Today's date: 04/15/2019 Total lbs lost to date: 19    04/15/2019  Height 5\' 1"  (1.549 m)  Weight 179 lb (81.2 kg)  BMI (Calculated) 33.84  BLOOD PRESSURE - SYSTOLIC [id number]  BLOOD PRESSURE - DIASTOLIC 72   Body Fat % Q000111Q %  Total Body Water (lbs) 67.6 lbs    ASK: We discussed the diagnosis of obesity with Jya Trollinger-Smith today and Michelene agreed to give [name] permission to discuss obesity behavioral modification therapy today.  ASSESS: Laiya has the diagnosis of obesity and her BMI today is 33.84 Betul is in the action stage of change   ADVISE: Clarkie was educated on the multiple health risks of obesity as well as the benefit of weight loss to improve her health. She was advised of the need for long term treatment and the importance of lifestyle modifications to improve her current health and to decrease her risk of future health problems.  AGREE: Multiple dietary modification options and treatment options were discussed and  Elliotte agreed to follow the recommendations documented in the above  note.  ARRANGE: Tomia was educated on the importance of frequent visits to treat obesity as outlined per CMS and USPSTF guidelines and agreed to schedule her next follow up appointment today.  I, Doreene Nest, am acting as transcriptionist for [employer], FNP-C  I have reviewed the above documentation for accuracy and completeness, and I agree with the above.  - Malik Paar, FNP-C.

## 2019-04-17 ENCOUNTER — Encounter (INDEPENDENT_AMBULATORY_CARE_PROVIDER_SITE_OTHER): Payer: Self-pay | Admitting: Family Medicine

## 2019-04-23 MED FILL — LOSARTAN-HCTZ 100-25 MG TAB: 100-25 | 30 days supply | Qty: 30 | Fill #9

## 2019-04-23 MED FILL — MONTELUKAST SOD 10 MG TAB: 10 | 90 days supply | Qty: 90 | Fill #0

## 2019-04-30 ENCOUNTER — Other Ambulatory Visit (INDEPENDENT_AMBULATORY_CARE_PROVIDER_SITE_OTHER): Payer: Self-pay | Admitting: Family Medicine

## 2019-04-30 ENCOUNTER — Encounter (INDEPENDENT_AMBULATORY_CARE_PROVIDER_SITE_OTHER): Payer: Self-pay | Admitting: Family Medicine

## 2019-04-30 DIAGNOSIS — R7303 Prediabetes: Secondary | ICD-10-CM

## 2019-04-30 MED FILL — metFORMIN HCL 500 MG TABS: 500 | 7 days supply | Qty: 14 | Fill #0

## 2019-05-06 ENCOUNTER — Encounter (INDEPENDENT_AMBULATORY_CARE_PROVIDER_SITE_OTHER): Payer: Self-pay | Admitting: Family Medicine

## 2019-05-06 ENCOUNTER — Ambulatory Visit (INDEPENDENT_AMBULATORY_CARE_PROVIDER_SITE_OTHER): Payer: 59 | Admitting: Family Medicine

## 2019-05-06 ENCOUNTER — Other Ambulatory Visit: Payer: Self-pay

## 2019-05-06 VITALS — BP 145/74 | HR 77 | Temp 98.6°F | Ht 61.0 in | Wt 180.0 lb

## 2019-05-06 DIAGNOSIS — E669 Obesity, unspecified: Secondary | ICD-10-CM | POA: Diagnosis not present

## 2019-05-06 DIAGNOSIS — Z6834 Body mass index (BMI) 34.0-34.9, adult: Secondary | ICD-10-CM

## 2019-05-06 DIAGNOSIS — E7849 Other hyperlipidemia: Secondary | ICD-10-CM

## 2019-05-06 DIAGNOSIS — Z9189 Other specified personal risk factors, not elsewhere classified: Secondary | ICD-10-CM | POA: Diagnosis not present

## 2019-05-06 DIAGNOSIS — R7303 Prediabetes: Secondary | ICD-10-CM | POA: Diagnosis not present

## 2019-05-06 DIAGNOSIS — E559 Vitamin D deficiency, unspecified: Secondary | ICD-10-CM

## 2019-05-06 MED ORDER — VITAMIN D (ERGOCALCIFEROL) 1.25 MG (50000 UNIT) PO CAPS: 50000.0000 [IU] | ORAL_CAPSULE | ORAL | 0 refills | Status: DC

## 2019-05-06 MED ORDER — METFORMIN HCL 500 MG PO TABS: 500.0000 mg | ORAL_TABLET | Freq: Two times a day (BID) | ORAL | 0 refills | Status: DC

## 2019-05-06 MED FILL — VIT D2 1.25 MG (50,000 UNIT: 1.25 MG | 28 days supply | Qty: 2 | Fill #0

## 2019-05-06 MED FILL — metFORMIN HCL 500 MG TABS: 500 | 30 days supply | Qty: 60 | Fill #0

## 2019-05-06 NOTE — Progress Notes (Signed)
Office: [phone]  /  Fax: [phone]   HPI:   Chief Complaint: OBESITY Katherine Tanner is here to discuss her progress with her obesity treatment plan. She is on the Category 2 plan and is following her eating plan approximately 90 % of the time. She states she is cycling 30 minutes 5 times per week. Katherine Tanner is bored with her food options and she would like to be able to have foods other than what is on the Category 2 plan. Her weight is 180 lb (81.6 kg) today and has had a weight gain of 1 pound over a period of 3 weeks since her last visit. She has lost 18 lbs since starting treatment with [name].  Pre-Diabetes Katherine Tanner has a diagnosis of prediabetes based on her elevated Hgb A1c and was informed this puts her at greater risk of developing diabetes. Her last A1c was at 5.9 on 01/04/19. She is taking metformin currently and continues to work on diet and exercise to decrease risk of diabetes. She denies hypoglycemia or polyphagia.  At risk for diabetes Katherine Tanner is at higher than average risk for developing diabetes due to her obesity and prediabetes. She currently denies polyuria or polydipsia.  Vitamin D deficiency Katherine Tanner has a diagnosis of vitamin D deficiency. Her last vitamin D level was at goal (53.3 on 01/14/19). She is currently taking prescription vit D every other week and she denies nausea, vomiting or muscle weakness.  Hyperlipidemia Katherine Tanner has hyperlipidemia and she is not on statin. Her last LDL was elevated at 129, HDL was at 51 and triglycerides were 69 (01/14/19). She has been trying to improve her cholesterol levels with intensive lifestyle modification including a low saturated fat diet, exercise and weight loss. She denies any chest pain or shortness of breath. The 10-year ASCVD risk score Katherine Bussing DC Jr., et al., 2013) is: 8.2%   Values used to calculate the score:     Age: 58 years     Sex: Female     Is Non-Hispanic African American: Yes     Diabetic: No     Tobacco smoker: No  Systolic Blood Pressure: [id number] mmHg     Is BP treated: Yes     HDL Cholesterol: 51 mg/dL     Total Cholesterol: 194 mg/dL   ASSESSMENT AND PLAN:  Vitamin D deficiency - Plan: Vitamin D, Ergocalciferol, (DRISDOL) 1.25 MG (50000 UT) CAPS capsule, VITAMIN D 25 Hydroxy (Vit-D Deficiency, Fractures)  Prediabetes - Plan: metFORMIN (GLUCOPHAGE) 500 MG tablet, Hemoglobin A1c, Insulin, random, Comprehensive metabolic panel  Other hyperlipidemia - Plan: Lipid Panel With LDL/HDL Ratio  At risk for diabetes mellitus  Class 1 obesity with serious comorbidity and body mass index (BMI) of 34.0 to 34.9 in adult, unspecified obesity type  PLAN:  Pre-Diabetes Katherine Tanner will continue to work on weight loss, exercise, and decreasing simple carbohydrates in her diet to help decrease the risk of diabetes. We dicussed metformin including benefits and risks. We will check A1c, CMET and fasting inulin level today. Katherine Tanner agrees to continue metformin 500 BID #60 with no refills and follow up with [name] as directed to monitor her progress.  Diabetes risk counseling Katherine Tanner was given extended (15 minutes) diabetes prevention counseling today. She is 58 y.o. female and has risk factors for diabetes including obesity and prediabetes. We discussed intensive lifestyle modifications today with an emphasis on weight loss as well as increasing exercise and decreasing simple carbohydrates in her diet.  Vitamin D Deficiency Katherine Tanner was informed that low  vitamin D levels contributes to fatigue and are associated with obesity, breast, and colon cancer. She agrees to continue to take prescription Vit D @50 ,000 IU every other week #2 with no refills and she will follow up for routine testing of vitamin D, at least 2-3 times per year. She was informed of the risk of over-replacement of vitamin D and agrees to not increase her dose unless she discusses this with [name] first. We will check vitamin D level today and Katherine Tanner agrees to follow up as  directed.  Hyperlipidemia Katherine Tanner was informed of the American Heart Association Guidelines emphasizing intensive lifestyle modifications as the first line treatment for hyperlipidemia. We discussed many lifestyle modifications today in depth, and Katherine Tanner will continue to work on decreasing saturated fats such as fatty red meat, butter and many fried foods. She will also increase vegetables and lean protein in her diet and continue to work on exercise and weight loss efforts. We will check fasting lipid panel today.  Obesity Katherine Tanner is currently in the action stage of change. As such, her goal is to continue with weight loss efforts She has agreed to keep a food journal with 1150 to 1250 calories and 85 grams of protein daily or follow the Category 2 plan Katherine Tanner will continue cycling for 30 minutes, 5 times per week for weight loss and overall health benefits. We discussed the following Behavioral Modification Strategies today: planning for success, increase H2O intake, keep a strict food journal, increasing lean protein intake, decreasing simple carbohydrates, increasing vegetables and work on meal planning and easy cooking plans Handouts for journaling, eating out, protein content and recipes were given to patient today. MyFitnessPal was demonstrated for patient today.  Katherine Tanner has agreed to follow up with our clinic in 2 to 3 weeks. She was informed of the importance of frequent follow up visits to maximize her success with intensive lifestyle modifications for her multiple health conditions.  ALLERGIES: Allergies  Allergen Reactions  . Ace Inhibitors Shortness Of Breath and Cough    Cough/ breathing problems   . Amoxicillin-Pot Clavulanate Swelling    SWELLING REACTION UNSPECIFIED   . Influenza Virus Vacc Split Pf     UNSPECIFIED REACTION   . Shellfish Allergy Itching    MEDICATIONS: Current Outpatient Medications on File Prior to Visit  Medication Sig Dispense Refill  . albuterol  (VENTOLIN HFA) 108 (90 Base) MCG/ACT inhaler INHALE 2 PUFFS BY MOUTH INTO THE LUNGS EVERY 6 HOURS AS NEEDED FOR WHEEZING OR SHORTNESS OF BREATH. 18 g 12  . Calcium Citrate-Vitamin D (CALCIUM + D PO) Take 1 tablet by mouth daily.    [name] EPINEPHRINE 0.3 mg/0.3 mL IJ SOAJ injection INJECT 1 SYRINGE INTO THE MUSCLE ONCE 1 each 1  . fluticasone (FLONASE) 50 MCG/ACT nasal spray Place 2 sprays into both nostrils daily. 48 g 3  . Fluticasone-Salmeterol (ADVAIR DISKUS) 250-50 MCG/DOSE AEPB INHALE 1 PUFF BY MOUTH ONCE DAILY THEN RINSE MOUTH 60 each 2  . guaiFENesin (MUCINEX) 600 MG 12 hr tablet Take 600 mg by mouth 2 (two) times daily as needed.     [name] ipratropium-albuterol (DUONEB) 0.5-2.5 (3) MG/3ML SOLN USE 1 VIAL VIA NEBULIZER EVERY 6 HOURS AS NEEDED 90 mL 12  . loratadine (CLARITIN) 10 MG tablet Take 10 mg by mouth daily.     [name] losartan-hydrochlorothiazide (HYZAAR) 100-25 MG tablet Take 1 tablet by mouth daily. 90 tablet 3  . montelukast (SINGULAIR) 10 MG tablet Take 1 tablet (10 mg total) by mouth at  bedtime. 90 tablet 3  . Multiple Vitamin (MULTIVITAMIN PO) Take 1 tablet by mouth daily.     . Nebulizers (COMPRESSOR/NEBULIZER) MISC Use as directed 1 each 0   No current facility-administered medications on file prior to visit.     PAST MEDICAL HISTORY: Past Medical History:  Diagnosis Date  . Allergy    takes Claritin daily and Flonase daily  . Arthritis   . Asthma    uses Albuterol daily as needed;Singulair nightly;DUlera daily  . Back pain   . Constipation   . Fatty liver   . Food allergy   . GERD (gastroesophageal reflux disease)    occasionally will take OTC meds but states she just watches what she eats  . H/O hiatal hernia   . History of bronchitis    last time 70yrs ago  . History of colon polyps   . History of kidney stones   . History of shingles   . Hyperlipidemia   . Hypertension    takes Hyzaar daily  . Joint pain   . Joint pain   . Joint swelling   . Obesity   .  Osteoarthritis   . Pneumonia    hx of;last time at least 8yrs ago  . PONV (postoperative nausea and vomiting)   . Prediabetes   . Shortness of breath   . Sleep apnea   . Swelling of lower extremity   . Trouble in sleeping   . Wheezing     PAST SURGICAL HISTORY: Past Surgical History:  Procedure Laterality Date  . BREAST EXCISIONAL BIOPSY Right   . CARDIAC CATHETERIZATION    . COLONOSCOPY    . ESOPHAGOGASTRODUODENOSCOPY    . KNEE SURGERY Right    arthroscopy  . Right ganglion cyst     x 2  . right lumpectomy  around 1983  . STERIOD INJECTION Left 11/15/2013   Procedure: Marcaine/STEROID INJECTION;  Surgeon: Alta Corning, MD;  Location: [facility];  Service: Orthopedics;  Laterality: Left;  . TOTAL KNEE ARTHROPLASTY Right 11/15/2013   Procedure: RIGHT TOTAL KNEE ARTHROPLASTY;  Surgeon: Alta Corning, MD;  Location: [facility];  Service: Orthopedics;  Laterality: Right;  . TOTAL KNEE ARTHROPLASTY Left 12/30/2016   Procedure: TOTAL KNEE ARTHROPLASTY;  Surgeon: Dorna Leitz, MD;  Location: [facility];  Service: Orthopedics;  Laterality: Left;  . TUBAL LIGATION      SOCIAL HISTORY: Social History   Tobacco Use  . Smoking status: Never Smoker  . Smokeless tobacco: Never Used  Substance Use Topics  . Alcohol use: No    Alcohol/week: 0.0 standard drinks  . Drug use: No    FAMILY HISTORY: Family History  Problem Relation Age of Onset  . Diabetes Father   . Cancer Father   . Liver disease Father   . Alcoholism Father   . Sarcoidosis Mother   . Glaucoma Mother   . Hypertension Mother   . Hyperlipidemia Mother   . Sleep apnea Mother   . Obesity Mother   . Breast cancer Neg Hx     ROS: Review of Systems  Constitutional: Negative for weight loss.  Respiratory: Negative for shortness of breath.   Cardiovascular: Negative for chest pain.  Gastrointestinal: Negative for nausea and vomiting.  Genitourinary: Negative for frequency.  Musculoskeletal:       Negative for muscle weakness   Endo/Heme/Allergies: Negative for polydipsia.       Negative for hypoglycemia Negative for polyphagia    PHYSICAL EXAM: Blood pressure (!) 145/74,  pulse 77, temperature 98.6 F (37 C), temperature source Oral, height 5\' 1"  (1.549 m), weight 180 lb (81.6 kg), last menstrual period 02/20/2014, SpO2 99 %. Body mass index is 34.01 kg/m. Physical Exam Vitals signs reviewed.  Constitutional:      Appearance: Normal appearance. She is well-developed. She is obese.  Cardiovascular:     Rate and Rhythm: Normal rate.  Pulmonary:     Effort: Pulmonary effort is normal.  Musculoskeletal: Normal range of motion.  Skin:    General: Skin is warm and dry.  Neurological:     Mental Status: She is alert and oriented to person, place, and time.  Psychiatric:        Mood and Affect: Mood normal.        Behavior: Behavior normal.     RECENT LABS AND TESTS: BMET    Component Value Date/Time   NA 144 01/14/2019 1118   K 4.3 01/14/2019 1118   CL 103 01/14/2019 1118   CO2 25 01/14/2019 1118   GLUCOSE 100 (H) 01/14/2019 1118   GLUCOSE 80 07/02/2018 1544   BUN 14 01/14/2019 1118   CREATININE 0.52 (L) 01/14/2019 1118   CALCIUM 10.1 01/14/2019 1118   GFRNONAA 107 01/14/2019 1118   GFRAA 124 01/14/2019 1118   Lab Results  Component Value Date   HGBA1C 5.9 (H) 01/14/2019   HGBA1C 5.9 (H) 08/30/2018   HGBA1C 6.1 06/08/2017   HGBA1C 6.1 06/15/2016   HGBA1C 6.1 06/05/2015   Lab Results  Component Value Date   INSULIN 8.8 01/14/2019   INSULIN 9.3 08/30/2018   CBC    Component Value Date/Time   WBC 9.8 07/02/2018 1544   RBC 4.76 07/02/2018 1544   HGB 13.7 07/02/2018 1544   HCT 41.8 07/02/2018 1544   PLT 125.0 (L) 07/02/2018 1544   MCV 87.9 07/02/2018 1544   MCH 28.8 07/07/2017 1207   MCHC 32.8 07/02/2018 1544   RDW 14.0 07/02/2018 1544   LYMPHSABS 2.7 07/02/2018 1544   MONOABS 1.0 07/02/2018 1544   EOSABS 0.2 07/02/2018 1544   BASOSABS 0.1 07/02/2018 1544    Iron/TIBC/Ferritin/ %Sat No results found for: IRON, TIBC, FERRITIN, IRONPCTSAT Lipid Panel     Component Value Date/Time   CHOL 194 10/04/2018 1457   TRIG 69.0 10/04/2018 1457   HDL 51.00 10/04/2018 1457   CHOLHDL 4 10/04/2018 1457   VLDL 13.8 10/04/2018 1457   LDLCALC 129 (H) 10/04/2018 1457   LDLDIRECT 147.9 12/17/2012 1604   Hepatic Function Panel     Component Value Date/Time   PROT 7.1 01/14/2019 1118   ALBUMIN 4.5 01/14/2019 1118   AST 31 01/14/2019 1118   ALT 54 (H) 01/14/2019 1118   ALKPHOS 172 (H) 01/14/2019 1118   BILITOT 0.2 01/14/2019 1118   BILIDIR 0.1 01/02/2012 1626   IBILI 0.3 01/05/2010 2038      Component Value Date/Time   TSH 2.36 07/02/2018 1544   TSH 1.26 06/08/2017 1457   TSH 0.93 06/15/2016 1359    Results for LAKENZIE, STONEBURNER (MRN [id number]) as of 05/06/2019 14:04  Ref. Range 01/14/2019 11:18  Vitamin D, 25-Hydroxy Latest Ref Range: 30.0 - 100.0 ng/mL 53.3    OBESITY BEHAVIORAL INTERVENTION VISIT  Today's visit was # 16   Starting weight: 198 lbs Starting date: 08/30/2018 Today's weight : 180 lbs Today's date: 05/06/2019 Total lbs lost to date: 18    05/06/2019  Height 5\' 1"  (1.549 m)  Weight 180 lb (81.6 kg)  BMI (Calculated) 34.03  BLOOD  PRESSURE - SYSTOLIC Q000111Q  BLOOD PRESSURE - DIASTOLIC 74   Body Fat % 0000000 %  Total Body Water (lbs) 68.8 lbs    ASK: We discussed the diagnosis of obesity with Katherine Tanner today and Katherine Tanner agreed to give [name] permission to discuss obesity behavioral modification therapy today.  ASSESS: Katherine Tanner has the diagnosis of obesity and her BMI today is 34.03 Katherine Tanner is in the action stage of change   ADVISE: Javonne was educated on the multiple health risks of obesity as well as the benefit of weight loss to improve her health. She was advised of the need for long term treatment and the importance of lifestyle modifications to improve her current health and to decrease her risk of future  health problems.  AGREE: Multiple dietary modification options and treatment options were discussed and  Katherine Tanner agreed to follow the recommendations documented in the above note.  ARRANGE: Katherine Tanner was educated on the importance of frequent visits to treat obesity as outlined per CMS and USPSTF guidelines and agreed to schedule her next follow up appointment today.  I, Doreene Nest, am acting as transcriptionist for [employer], FNP-C  I have reviewed the above documentation for accuracy and completeness, and I agree with the above.  - Malachi Suderman, FNP-C.

## 2019-05-07 ENCOUNTER — Encounter (INDEPENDENT_AMBULATORY_CARE_PROVIDER_SITE_OTHER): Payer: Self-pay | Admitting: Family Medicine

## 2019-05-07 ENCOUNTER — Other Ambulatory Visit: Payer: Self-pay | Admitting: Internal Medicine

## 2019-05-07 LAB — LIPID PANEL WITH LDL/HDL RATIO
Cholesterol, Total: 196 mg/dL (ref 100–199)
HDL: 64 mg/dL (ref >39)
LDL Chol Calc (NIH): 122 mg/dL — ABNORMAL HIGH (ref 0–99)
LDL/HDL Ratio: 1.9 ratio (ref 0.0–3.2)
Triglycerides: 51 mg/dL (ref 0–149)
VLDL Cholesterol Cal: 10 mg/dL (ref 5–40)

## 2019-05-07 LAB — COMPREHENSIVE METABOLIC PANEL
ALT: 33 IU/L — ABNORMAL HIGH (ref 0–32)
AST: 20 IU/L (ref 0–40)
Albumin/Globulin Ratio: 2 (ref 1.2–2.2)
Albumin: 4.5 g/dL (ref 3.8–4.9)
Alkaline Phosphatase: 161 IU/L — ABNORMAL HIGH (ref 39–117)
BUN/Creatinine Ratio: 29 — ABNORMAL HIGH (ref 9–23)
BUN: 14 mg/dL (ref 6–24)
Bilirubin Total: <0.2 mg/dL (ref 0.0–1.2)
CO2: 28 mmol/L (ref 20–29)
Calcium: 9.5 mg/dL (ref 8.7–10.2)
Chloride: 102 mmol/L (ref 96–106)
Creatinine, Ser: 0.48 mg/dL — ABNORMAL LOW (ref 0.57–1.00)
GFR calc Af Amer: 127 mL/min/{1.73_m2} (ref >59)
GFR calc non Af Amer: 110 mL/min/{1.73_m2} (ref >59)
Globulin, Total: 2.3 g/dL (ref 1.5–4.5)
Glucose: 97 mg/dL (ref 65–99)
Potassium: 3.9 mmol/L (ref 3.5–5.2)
Sodium: 141 mmol/L (ref 134–144)
Total Protein: 6.8 g/dL (ref 6.0–8.5)

## 2019-05-07 LAB — INSULIN, RANDOM: INSULIN: 9.8 u[IU]/mL (ref 2.6–24.9)

## 2019-05-07 LAB — VITAMIN D 25 HYDROXY (VIT D DEFICIENCY, FRACTURES): Vit D, 25-Hydroxy: 52.3 ng/mL (ref 30.0–100.0)

## 2019-05-07 LAB — HEMOGLOBIN A1C
Est. average glucose Bld gHb Est-mCnc: 108 mg/dL
Hgb A1c MFr Bld: 5.4 % (ref 4.8–5.6)

## 2019-05-07 MED FILL — IPRAT-ALBUT 0.5-3(2.5) MG/3: 0.5-2.5 (3) | 7 days supply | Qty: 90 | Fill #0

## 2019-05-10 ENCOUNTER — Telehealth: Payer: Self-pay | Admitting: Internal Medicine

## 2019-05-10 ENCOUNTER — Other Ambulatory Visit: Payer: Self-pay | Admitting: Internal Medicine

## 2019-05-10 ENCOUNTER — Encounter (INDEPENDENT_AMBULATORY_CARE_PROVIDER_SITE_OTHER): Payer: Self-pay | Admitting: Family Medicine

## 2019-05-10 MED ORDER — TRAMADOL HCL 50 MG PO TABS: 50.0000 mg | ORAL_TABLET | Freq: Four times a day (QID) | ORAL | 0 refills | Status: DC | PRN

## 2019-05-10 MED ORDER — PREDNISONE 10 MG PO TABS: ORAL_TABLET | ORAL | 0 refills | Status: DC

## 2019-05-10 MED FILL — traMADol HCL 50 MG TABS: 50 | 10 days supply | Qty: 40 | Fill #0

## 2019-05-10 MED FILL — predniSONE 10 MG TABS: 10 | 8 days supply | Qty: 20 | Fill #0

## 2019-05-10 NOTE — Telephone Encounter (Signed)
Called and spoke with pt who stated she began coughing 2 days ago and also has been wheezing. Pt is coughing up clear mucus. Pt stated the cough was mainly at night but then began during the day. Pt stated she has been taking mucinex, had some tessalon perles left over and was taking that but that has not been helping.  Pt states she has done at least two neb treatments which she said did give her some relief. Pt said that she is not currently wheezing but does have tightness in her chest.  Pt denies any fever, has not had to be tested for covid and has not been around anyone that has either been sick or exposed to covid. Pt said her symptoms is something that she usually gets at least twice a year.  Pt is wanting to know if something could be prescribed to help with her symptoms and also if a cough med could be sent in for her as well.  Dr. Annamaria Boots, please advise on this for pt. Thanks!  Allergies  Allergen Reactions  . Ace Inhibitors Shortness Of Breath and Cough    Cough/ breathing problems   . Amoxicillin-Pot Clavulanate Swelling    SWELLING REACTION UNSPECIFIED   . Influenza Virus Vacc Split Pf     UNSPECIFIED REACTION   . Shellfish Allergy Itching     Current Outpatient Medications:  .  albuterol (VENTOLIN HFA) 108 (90 Base) MCG/ACT inhaler, INHALE 2 PUFFS BY MOUTH INTO THE LUNGS EVERY 6 HOURS AS NEEDED FOR WHEEZING OR SHORTNESS OF BREATH., Disp: 18 g, Rfl: 12 .  Calcium Citrate-Vitamin D (CALCIUM + D PO), Take 1 tablet by mouth daily., Disp: , Rfl:  .  EPINEPHRINE 0.3 mg/0.3 mL IJ SOAJ injection, INJECT 1 SYRINGE INTO THE MUSCLE ONCE, Disp: 1 each, Rfl: 1 .  fluticasone (FLONASE) 50 MCG/ACT nasal spray, Place 2 sprays into both nostrils daily., Disp: 48 g, Rfl: 3 .  Fluticasone-Salmeterol (ADVAIR DISKUS) 250-50 MCG/DOSE AEPB, INHALE 1 PUFF BY MOUTH ONCE DAILY THEN RINSE MOUTH, Disp: 60 each, Rfl: 2 .  guaiFENesin (MUCINEX) 600 MG 12 hr tablet, Take 600 mg by mouth 2 (two) times daily  as needed. , Disp: , Rfl:  .  ipratropium-albuterol (DUONEB) 0.5-2.5 (3) MG/3ML SOLN, USE 1 VIAL VIA NEBULIZER EVERY 6 HOURS AS NEEDED, Disp: 90 mL, Rfl: 5 .  loratadine (CLARITIN) 10 MG tablet, Take 10 mg by mouth daily. , Disp: , Rfl:  .  losartan-hydrochlorothiazide (HYZAAR) 100-25 MG tablet, Take 1 tablet by mouth daily., Disp: 90 tablet, Rfl: 3 .  metFORMIN (GLUCOPHAGE) 500 MG tablet, Take 1 tablet (500 mg total) by mouth 2 (two) times daily with a meal., Disp: 60 tablet, Rfl: 0 .  montelukast (SINGULAIR) 10 MG tablet, Take 1 tablet (10 mg total) by mouth at bedtime., Disp: 90 tablet, Rfl: 3 .  Multiple Vitamin (MULTIVITAMIN PO), Take 1 tablet by mouth daily. , Disp: , Rfl:  .  Nebulizers (COMPRESSOR/NEBULIZER) MISC, Use as directed, Disp: 1 each, Rfl: 0 .  Vitamin D, Ergocalciferol, (DRISDOL) 1.25 MG (50000 UT) CAPS capsule, Take 1 capsule (50,000 Units total) by mouth every 14 (fourteen) days., Disp: 2 capsule, Rfl: 0

## 2019-05-10 NOTE — Telephone Encounter (Signed)
Called pt and advised message from the provider. Pt understood and verbalized understanding. Nothing further is needed.    

## 2019-05-10 NOTE — Telephone Encounter (Signed)
I am sending scripts for a prednisone taper and for tramadol for cough. She can also take Delsym cough syrup otc for cough if needed, and she can use her nebulizer as often as every 6 hours if needed.

## 2019-05-13 NOTE — Telephone Encounter (Signed)
Please advise 

## 2019-05-21 ENCOUNTER — Other Ambulatory Visit: Payer: Self-pay | Admitting: Obstetrics and Gynecology

## 2019-05-21 ENCOUNTER — Other Ambulatory Visit (HOSPITAL_COMMUNITY)
Admission: RE | Admit: 2019-05-21 | Discharge: 2019-05-21 | Disposition: A | Discharge status: Home / Self Care | Payer: 59 | Source: Ambulatory Visit | Attending: Obstetrics and Gynecology | Admitting: Obstetrics and Gynecology

## 2019-05-21 DIAGNOSIS — Z01419 Encounter for gynecological examination (general) (routine) without abnormal findings: Secondary | ICD-10-CM | POA: Diagnosis not present

## 2019-05-22 MED FILL — LOSARTAN-HCTZ 100-25 MG TAB: 100-25 | 30 days supply | Qty: 30 | Fill #10

## 2019-05-22 MED FILL — ADVAIR 250/50 DISKUS: 250-50 | 60 days supply | Qty: 60 | Fill #0

## 2019-05-22 MED FILL — ALBUTEROL SULFATE HFA 108 (: 108 (90 BAS | 25 days supply | Qty: 18 | Fill #1

## 2019-05-22 MED FILL — MELOXICAM 15 MG TABLET: 15 | 30 days supply | Qty: 30 | Fill #2

## 2019-05-23 ENCOUNTER — Other Ambulatory Visit: Payer: Self-pay

## 2019-05-23 LAB — CYTOLOGY - PAP
Comment: NEGATIVE
Diagnosis: NEGATIVE
High risk HPV: NEGATIVE

## 2019-05-23 MED ORDER — FLUTICASONE-SALMETEROL 250-50 MCG/DOSE IN AEPB: INHALATION_SPRAY | RESPIRATORY_TRACT | 2 refills | Status: DC

## 2019-05-27 ENCOUNTER — Ambulatory Visit (INDEPENDENT_AMBULATORY_CARE_PROVIDER_SITE_OTHER): Payer: 59 | Admitting: Family Medicine

## 2019-05-27 ENCOUNTER — Encounter (INDEPENDENT_AMBULATORY_CARE_PROVIDER_SITE_OTHER): Payer: Self-pay | Admitting: Family Medicine

## 2019-05-27 ENCOUNTER — Other Ambulatory Visit: Payer: Self-pay

## 2019-05-27 VITALS — BP 114/70 | HR 74 | Temp 98.2°F | Ht 61.0 in | Wt 177.0 lb

## 2019-05-27 DIAGNOSIS — R7303 Prediabetes: Secondary | ICD-10-CM

## 2019-05-27 DIAGNOSIS — Z9189 Other specified personal risk factors, not elsewhere classified: Secondary | ICD-10-CM

## 2019-05-27 DIAGNOSIS — E669 Obesity, unspecified: Secondary | ICD-10-CM

## 2019-05-27 DIAGNOSIS — Z6833 Body mass index (BMI) 33.0-33.9, adult: Secondary | ICD-10-CM | POA: Diagnosis not present

## 2019-05-27 DIAGNOSIS — E559 Vitamin D deficiency, unspecified: Secondary | ICD-10-CM

## 2019-05-27 MED ORDER — VITAMIN D (ERGOCALCIFEROL) 1.25 MG (50000 UNIT) PO CAPS: 50000.0000 [IU] | ORAL_CAPSULE | ORAL | 0 refills | Status: DC

## 2019-05-27 MED ORDER — METFORMIN HCL 500 MG PO TABS: 500.0000 mg | ORAL_TABLET | Freq: Two times a day (BID) | ORAL | 0 refills | Status: DC

## 2019-05-28 NOTE — Progress Notes (Signed)
Office: [phone]  /  Fax: [phone]   HPI:   Chief Complaint: OBESITY Katherine Tanner is here to discuss her progress with her obesity treatment plan. She is on the Category 2 plan and is following her eating plan approximately 90 % of the time. She states she is cycling 20 to 30 minutes 4 times per week. Katherine Tanner is sticking well to the plan. Katherine Tanner is used to the plan and she reports it is a habit now to eat in this manner. Her weight is 177 lb (80.3 kg) today and has had a weight loss of 4 pounds over a period of 3 weeks since her last visit. She has lost 21 lbs since starting treatment with [name].  Vitamin D deficiency Katherine Tanner has a diagnosis of vitamin D deficiency. Prescription vitamin D every 14 days has maintained her vitamin D levels well. She denies nausea, vomiting or muscle weakness.  Pre-Diabetes Katherine Tanner has a diagnosis of prediabetes based on her elevated Hgb A1c. Her A1c is down from 5.9 to 5.4. Katherine Tanner is on metformin 500 mg two times daily and she is tolerating it well. She continues to work on diet and exercise to decrease risk of diabetes. She denies polyphagia.  At risk for diabetes Katherine Tanner is at higher than average risk for developing diabetes due to her obesity and prediabetes. She currently denies polyuria or polydipsia.  ASSESSMENT AND PLAN:  Vitamin D deficiency - Plan: Vitamin D, Ergocalciferol, (DRISDOL) 1.25 MG (50000 UT) CAPS capsule  Prediabetes - Plan: metFORMIN (GLUCOPHAGE) 500 MG tablet  At risk for diabetes mellitus  Class 1 obesity with serious comorbidity and body mass index (BMI) of 33.0 to 33.9 in adult, unspecified obesity type  PLAN:  Vitamin D Deficiency Katherine Tanner was informed that low vitamin D levels contributes to fatigue and are associated with obesity, breast, and colon cancer. Katherine Tanner agrees to continue to take prescription Vit D @50 ,000 IU every other week #6 with no refills and she will follow up for routine testing of vitamin D, at least 2-3  times per year. She was informed of the risk of over-replacement of vitamin D and agrees to not increase her dose unless she discusses this with [name] first. Makaia agrees to follow up as directed.  Pre-Diabetes Katherine Tanner will continue to work on weight loss, exercise, and decreasing simple carbohydrates in her diet to help decrease the risk of diabetes. Katherine Tanner agrees to continue metformin 500 mg BID #60 with no refills and follow up with [name] as directed to monitor her progress.  Diabetes risk counseling Katherine Tanner was given extended (15 minutes) diabetes prevention counseling today. She is 58 y.o. female and has risk factors for diabetes including obesity and prediabetes. We discussed intensive lifestyle modifications today with an emphasis on weight loss as well as increasing exercise and decreasing simple carbohydrates in her diet.  Obesity Katherine Tanner is currently in the action stage of change. As such, her goal is to continue with weight loss efforts She has agreed to follow the Category 2 plan Katherine Tanner will continue cycling for 20 to 30 minutes, 4 times per week for weight loss and overall health benefits. We discussed the following Behavioral Modification Strategies today: planning for success and holiday eating strategies   Handout for holiday tips was given to patient today.  Katherine Tanner has agreed to follow up with our clinic in 3 weeks. She was informed of the importance of frequent follow up visits to maximize her success with intensive lifestyle modifications for her multiple health conditions.  ALLERGIES: Allergies  Allergen Reactions  . Ace Inhibitors Shortness Of Breath and Cough    Cough/ breathing problems   . Amoxicillin-Pot Clavulanate Swelling    SWELLING REACTION UNSPECIFIED   . Influenza Virus Vacc Split Pf     UNSPECIFIED REACTION   . Shellfish Allergy Itching    MEDICATIONS: Current Outpatient Medications on File Prior to Visit  Medication Sig Dispense Refill  . albuterol (VENTOLIN  HFA) 108 (90 Base) MCG/ACT inhaler INHALE 2 PUFFS BY MOUTH INTO THE LUNGS EVERY 6 HOURS AS NEEDED FOR WHEEZING OR SHORTNESS OF BREATH. 18 g 12  . Calcium Citrate-Vitamin D (CALCIUM + D PO) Take 1 tablet by mouth daily.    [name] EPINEPHRINE 0.3 mg/0.3 mL IJ SOAJ injection INJECT 1 SYRINGE INTO THE MUSCLE ONCE 1 each 1  . fluticasone (FLONASE) 50 MCG/ACT nasal spray Place 2 sprays into both nostrils daily. 48 g 3  . Fluticasone-Salmeterol (ADVAIR DISKUS) 250-50 MCG/DOSE AEPB INHALE 1 PUFF BY MOUTH TWICE DAILY THEN RINSE MOUTH 60 each 2  . guaiFENesin (MUCINEX) 600 MG 12 hr tablet Take 600 mg by mouth 2 (two) times daily as needed.     [name] ipratropium-albuterol (DUONEB) 0.5-2.5 (3) MG/3ML SOLN USE 1 VIAL VIA NEBULIZER EVERY 6 HOURS AS NEEDED 90 mL 5  . loratadine (CLARITIN) 10 MG tablet Take 10 mg by mouth daily.     [name] losartan-hydrochlorothiazide (HYZAAR) 100-25 MG tablet Take 1 tablet by mouth daily. 90 tablet 3  . montelukast (SINGULAIR) 10 MG tablet Take 1 tablet (10 mg total) by mouth at bedtime. 90 tablet 3  . Multiple Vitamin (MULTIVITAMIN PO) Take 1 tablet by mouth daily.     . Nebulizers (COMPRESSOR/NEBULIZER) MISC Use as directed 1 each 0   No current facility-administered medications on file prior to visit.     PAST MEDICAL HISTORY: Past Medical History:  Diagnosis Date  . Allergy    takes Claritin daily and Flonase daily  . Arthritis   . Asthma    uses Albuterol daily as needed;Singulair nightly;DUlera daily  . Back pain   . Constipation   . Fatty liver   . Food allergy   . GERD (gastroesophageal reflux disease)    occasionally will take OTC meds but states she just watches what she eats  . H/O hiatal hernia   . History of bronchitis    last time 18yrs ago  . History of colon polyps   . History of kidney stones   . History of shingles   . Hyperlipidemia   . Hypertension    takes Hyzaar daily  . Joint pain   . Joint pain   . Joint swelling   . Obesity   . Osteoarthritis    . Pneumonia    hx of;last time at least 83yrs ago  . PONV (postoperative nausea and vomiting)   . Prediabetes   . Shortness of breath   . Sleep apnea   . Swelling of lower extremity   . Trouble in sleeping   . Wheezing     PAST SURGICAL HISTORY: Past Surgical History:  Procedure Laterality Date  . BREAST EXCISIONAL BIOPSY Right   . CARDIAC CATHETERIZATION    . COLONOSCOPY    . ESOPHAGOGASTRODUODENOSCOPY    . KNEE SURGERY Right    arthroscopy  . Right ganglion cyst     x 2  . right lumpectomy  around 1983  . STERIOD INJECTION Left 11/15/2013   Procedure: Marcaine/STEROID INJECTION;  Surgeon: Alta Corning,  MD;  Location: [facility];  Service: Orthopedics;  Laterality: Left;  . TOTAL KNEE ARTHROPLASTY Right 11/15/2013   Procedure: RIGHT TOTAL KNEE ARTHROPLASTY;  Surgeon: Alta Corning, MD;  Location: [place];  Service: Orthopedics;  Laterality: Right;  . TOTAL KNEE ARTHROPLASTY Left 12/30/2016   Procedure: TOTAL KNEE ARTHROPLASTY;  Surgeon: Dorna Leitz, MD;  Location: [place];  Service: Orthopedics;  Laterality: Left;  . TUBAL LIGATION      SOCIAL HISTORY: Social History   Tobacco Use  . Smoking status: Never Smoker  . Smokeless tobacco: Never Used  Substance Use Topics  . Alcohol use: No    Alcohol/week: 0.0 standard drinks  . Drug use: No    FAMILY HISTORY: Family History  Problem Relation Age of Onset  . Diabetes Father   . Cancer Father   . Liver disease Father   . Alcoholism Father   . Sarcoidosis Mother   . Glaucoma Mother   . Hypertension Mother   . Hyperlipidemia Mother   . Sleep apnea Mother   . Obesity Mother   . Breast cancer Neg Hx     ROS: Review of Systems  Constitutional: Positive for weight loss.  Gastrointestinal: Negative for nausea and vomiting.  Genitourinary: Negative for frequency.  Musculoskeletal:       Negative for muscle weakness  Endo/Heme/Allergies: Negative for polydipsia.       Negative for polyphagia    PHYSICAL EXAM: Blood  pressure 114/70, pulse 74, temperature 98.2 F (36.8 C), temperature source Oral, height 5\' 1"  (1.549 m), weight 177 lb (80.3 kg), last menstrual period 02/20/2014, SpO2 100 %. Body mass index is 33.44 kg/m. Physical Exam Vitals signs reviewed.  Constitutional:      Appearance: Normal appearance. She is well-developed. She is obese.  Cardiovascular:     Rate and Rhythm: Normal rate.  Pulmonary:     Effort: Pulmonary effort is normal.  Musculoskeletal: Normal range of motion.  Skin:    General: Skin is warm and dry.  Neurological:     Mental Status: She is alert and oriented to person, place, and time.  Psychiatric:        Mood and Affect: Mood normal.        Behavior: Behavior normal.     RECENT LABS AND TESTS: BMET    Component Value Date/Time   NA 141 05/06/2019 0845   K 3.9 05/06/2019 0845   CL 102 05/06/2019 0845   CO2 28 05/06/2019 0845   GLUCOSE 97 05/06/2019 0845   GLUCOSE 80 07/02/2018 1544   BUN 14 05/06/2019 0845   CREATININE 0.48 (L) 05/06/2019 0845   CALCIUM 9.5 05/06/2019 0845   GFRNONAA 110 05/06/2019 0845   GFRAA 127 05/06/2019 0845   Lab Results  Component Value Date   HGBA1C 5.4 05/06/2019   HGBA1C 5.9 (H) 01/14/2019   HGBA1C 5.9 (H) 08/30/2018   HGBA1C 6.1 06/08/2017   HGBA1C 6.1 06/15/2016   Lab Results  Component Value Date   INSULIN 9.8 05/06/2019   INSULIN 8.8 01/14/2019   INSULIN 9.3 08/30/2018   CBC    Component Value Date/Time   WBC 9.8 07/02/2018 1544   RBC 4.76 07/02/2018 1544   HGB 13.7 07/02/2018 1544   HCT 41.8 07/02/2018 1544   PLT 125.0 (L) 07/02/2018 1544   MCV 87.9 07/02/2018 1544   MCH 28.8 07/07/2017 1207   MCHC 32.8 07/02/2018 1544   RDW 14.0 07/02/2018 1544   LYMPHSABS 2.7 07/02/2018 1544   MONOABS 1.0  07/02/2018 1544   EOSABS 0.2 07/02/2018 1544   BASOSABS 0.1 07/02/2018 1544   Iron/TIBC/Ferritin/ %Sat No results found for: IRON, TIBC, FERRITIN, IRONPCTSAT Lipid Panel     Component Value Date/Time   CHOL  196 05/06/2019 0845   TRIG 51 05/06/2019 0845   HDL 64 05/06/2019 0845   CHOLHDL 4 10/04/2018 1457   VLDL 13.8 10/04/2018 1457   LDLCALC 122 (H) 05/06/2019 0845   LDLDIRECT 147.9 12/17/2012 1604   Hepatic Function Panel     Component Value Date/Time   PROT 6.8 05/06/2019 0845   ALBUMIN 4.5 05/06/2019 0845   AST 20 05/06/2019 0845   ALT 33 (H) 05/06/2019 0845   ALKPHOS 161 (H) 05/06/2019 0845   BILITOT <0.2 05/06/2019 0845   BILIDIR 0.1 01/02/2012 1626   IBILI 0.3 01/05/2010 2038      Component Value Date/Time   TSH 2.36 07/02/2018 1544   TSH 1.26 06/08/2017 1457   TSH 0.93 06/15/2016 1359     Ref. Range 05/06/2019 08:45  Vitamin D, 25-Hydroxy Latest Ref Range: 30.0 - 100.0 ng/mL 52.3    OBESITY BEHAVIORAL INTERVENTION VISIT  Today's visit was # 17   Starting weight: 198 lbs Starting date: 08/30/2018 Today's weight : 177 lbs Today's date: 05/27/2019 Total lbs lost to date: 21    05/27/2019  Height 5\' 1"  (1.549 m)  Weight 177 lb (80.3 kg)  BMI (Calculated) 33.46  BLOOD PRESSURE - SYSTOLIC 99991111  BLOOD PRESSURE - DIASTOLIC 70   Body Fat % 44 %  Total Body Water (lbs) 68.4 lbs    ASK: We discussed the diagnosis of obesity with Katherine Tanner today and Mickelle agreed to give [name] permission to discuss obesity behavioral modification therapy today.  ASSESS: Breanne has the diagnosis of obesity and her BMI today is 33.46 Katherine Tanner is in the action stage of change   ADVISE: Kelvin was educated on the multiple health risks of obesity as well as the benefit of weight loss to improve her health. She was advised of the need for long term treatment and the importance of lifestyle modifications to improve her current health and to decrease her risk of future health problems.  AGREE: Multiple dietary modification options and treatment options were discussed and  Katherine Tanner agreed to follow the recommendations documented in the above note.  ARRANGE: Katherine Tanner was educated on  the importance of frequent visits to treat obesity as outlined per CMS and USPSTF guidelines and agreed to schedule her next follow up appointment today.  I, Doreene Nest, am acting as transcriptionist for [employer], FNP-C  I have reviewed the above documentation for accuracy and completeness, and I agree with the above.  - Willy Vorce, FNP-C.

## 2019-05-31 MED FILL — VIT D2 1.25 MG (50,000 UNIT: 1.25 MG | 84 days supply | Qty: 6 | Fill #0

## 2019-05-31 MED FILL — metFORMIN HCL 500 MG TABS: 500 | 30 days supply | Qty: 60 | Fill #0

## 2019-06-03 ENCOUNTER — Encounter (INDEPENDENT_AMBULATORY_CARE_PROVIDER_SITE_OTHER): Payer: Self-pay | Admitting: Family Medicine

## 2019-06-13 DIAGNOSIS — M25512 Pain in left shoulder: Secondary | ICD-10-CM | POA: Diagnosis not present

## 2019-06-13 DIAGNOSIS — M25562 Pain in left knee: Secondary | ICD-10-CM | POA: Diagnosis not present

## 2019-06-13 DIAGNOSIS — M25561 Pain in right knee: Secondary | ICD-10-CM | POA: Diagnosis not present

## 2019-06-13 MED FILL — predniSONE 10 MG (21) TBPK: 10 | 6 days supply | Qty: 21 | Fill #0

## 2019-06-17 ENCOUNTER — Encounter (INDEPENDENT_AMBULATORY_CARE_PROVIDER_SITE_OTHER): Payer: Self-pay | Admitting: Family Medicine

## 2019-06-17 ENCOUNTER — Other Ambulatory Visit: Payer: Self-pay

## 2019-06-17 ENCOUNTER — Ambulatory Visit (INDEPENDENT_AMBULATORY_CARE_PROVIDER_SITE_OTHER): Payer: 59 | Admitting: Family Medicine

## 2019-06-17 VITALS — BP 147/78 | HR 72 | Temp 98.3°F | Ht 61.0 in | Wt 177.0 lb

## 2019-06-17 DIAGNOSIS — E559 Vitamin D deficiency, unspecified: Secondary | ICD-10-CM

## 2019-06-17 DIAGNOSIS — E669 Obesity, unspecified: Secondary | ICD-10-CM

## 2019-06-17 DIAGNOSIS — R7303 Prediabetes: Secondary | ICD-10-CM | POA: Diagnosis not present

## 2019-06-17 DIAGNOSIS — E8881 Metabolic syndrome: Secondary | ICD-10-CM | POA: Diagnosis not present

## 2019-06-17 DIAGNOSIS — Z6833 Body mass index (BMI) 33.0-33.9, adult: Secondary | ICD-10-CM | POA: Diagnosis not present

## 2019-06-17 DIAGNOSIS — Z9189 Other specified personal risk factors, not elsewhere classified: Secondary | ICD-10-CM

## 2019-06-17 MED ORDER — VITAMIN D (ERGOCALCIFEROL) 1.25 MG (50000 UNIT) PO CAPS: 50000.0000 [IU] | ORAL_CAPSULE | ORAL | 0 refills | Status: DC

## 2019-06-17 MED ORDER — METFORMIN HCL 500 MG PO TABS: 500.0000 mg | ORAL_TABLET | Freq: Two times a day (BID) | ORAL | 0 refills | Status: DC

## 2019-06-18 NOTE — Progress Notes (Signed)
Office: [phone]  /  Fax: [phone]   HPI:  Chief Complaint: OBESITY Katherine Tanner is here to discuss her progress with her obesity treatment plan. She is on the Category 2 plan and states she is following her eating plan approximately 90 % of the time. She states she is exercising 0 minutes 0 times per week.  Katherine Tanner is not eating all of the protein on the plan. She is on prednisone for knee pain which affects her in an unusual manner in that it causes her appetite to be decreased. She is done with prednisone tomorrow. Katherine Tanner has not been exercising due to pain.  Insulin Resistance Katherine Tanner has a diagnosis of insulin resistance and she is on metformin twice daily. She has no polyphagia. Katherine Tanner denies nausea or vomiting.   Vitamin D deficiency Katherine Tanner has a diagnosis of vitamin D deficiency. She is maintained on prescription vitamin D every 14 days. Her last vitamin D level was at goal. Katherine Tanner denies nausea, vomiting or muscle weakness.  At risk for osteopenia and osteoporosis Katherine Tanner is at higher risk of osteopenia and osteoporosis due to vitamin D deficiency.   Today's visit was # 18  Starting weight: 198 lbs Starting date: 08/30/2018 Today's weight : 177 lbs Today's date: 06/17/2019 Total lbs lost to date: 21 Total lbs lost since last in-office visit: 0  ASSESSMENT AND PLAN:  Insulin resistance - Plan: metFORMIN (GLUCOPHAGE) 500 MG tablet  Prediabetes  Vitamin D deficiency - Plan: Vitamin D, Ergocalciferol, (DRISDOL) 1.25 MG (50000 UT) CAPS capsule  At risk for osteoporosis  Class 1 obesity with serious comorbidity and body mass index (BMI) of 33.0 to 33.9 in adult, unspecified obesity type  PLAN:  Insulin Resistance Katherine Tanner will continue to work on weight loss, exercise, and decreasing simple carbohydrates to help decrease the risk of diabetes. Katherine Tanner agrees to continue metformin 500 mg two times daily with a meal #60 with no refills and follow up with [name] as directed to  closely monitor her progress.  Vitamin D Deficiency Low vitamin D level contributes to fatigue and are associated with obesity, breast, and colon cancer. [name] agrees to continue to take prescription Vit D @50 ,000 IU every 14 days #6 with no refills and she will follow up for routine testing of vitamin D, at least 2-3 times per year to avoid over-replacement. Katherine Tanner agrees to follow up as directed.  At risk for osteopenia and osteoporosis Katherine Tanner was given extended (15 minutes) osteoporosis prevention counseling today. Katherine Tanner is at risk for osteopenia and osteoporosis due to her vitamin D deficiency. She was encouraged to take her vitamin D and follow her higher calcium diet and increase strengthening exercise to help strengthen her bones and decrease her risk of osteopenia and osteoporosis.  Obesity Katherine Tanner is currently in the action stage of change. As such, her goal is to continue with weight loss efforts She has agreed to follow the Category 2 plan Katherine Tanner will start the stationary bike when her pain improves for weight loss and overall health benefits. We discussed the following Behavioral Modification Strategies today: planning for success and increasing lean protein intake  We discussed adding milk or yogurt in place of 2 ounces of meat.  Katherine Tanner has agreed to follow up with our clinic in 3 weeks. She was informed of the importance of frequent follow up visits to maximize her success with intensive lifestyle modifications for her multiple health conditions.  ALLERGIES: Allergies  Allergen Reactions  . Ace Inhibitors Shortness Of Breath and Cough  Cough/ breathing problems   . Amoxicillin-Pot Clavulanate Swelling    SWELLING REACTION UNSPECIFIED   . Influenza Virus Vacc Split Pf     UNSPECIFIED REACTION   . Shellfish Allergy Itching    MEDICATIONS: Current Outpatient Medications on File Prior to Visit  Medication Sig Dispense Refill  . albuterol (VENTOLIN HFA) 108 (90 Base)  MCG/ACT inhaler INHALE 2 PUFFS BY MOUTH INTO THE LUNGS EVERY 6 HOURS AS NEEDED FOR WHEEZING OR SHORTNESS OF BREATH. 18 g 12  . Calcium Citrate-Vitamin D (CALCIUM + D PO) Take 1 tablet by mouth daily.    [name] EPINEPHRINE 0.3 mg/0.3 mL IJ SOAJ injection INJECT 1 SYRINGE INTO THE MUSCLE ONCE 1 each 1  . fluticasone (FLONASE) 50 MCG/ACT nasal spray Place 2 sprays into both nostrils daily. 48 g 3  . Fluticasone-Salmeterol (ADVAIR DISKUS) 250-50 MCG/DOSE AEPB INHALE 1 PUFF BY MOUTH TWICE DAILY THEN RINSE MOUTH 60 each 2  . guaiFENesin (MUCINEX) 600 MG 12 hr tablet Take 600 mg by mouth 2 (two) times daily as needed.     [name] ipratropium-albuterol (DUONEB) 0.5-2.5 (3) MG/3ML SOLN USE 1 VIAL VIA NEBULIZER EVERY 6 HOURS AS NEEDED 90 mL 5  . loratadine (CLARITIN) 10 MG tablet Take 10 mg by mouth daily.     [name] losartan-hydrochlorothiazide (HYZAAR) 100-25 MG tablet Take 1 tablet by mouth daily. 90 tablet 3  . montelukast (SINGULAIR) 10 MG tablet Take 1 tablet (10 mg total) by mouth at bedtime. 90 tablet 3  . Multiple Vitamin (MULTIVITAMIN PO) Take 1 tablet by mouth daily.     . Nebulizers (COMPRESSOR/NEBULIZER) MISC Use as directed 1 each 0  . predniSONE (STERAPRED UNI-PAK 21 TAB) 10 MG (21) TBPK tablet Take 10 mg by mouth daily.     No current facility-administered medications on file prior to visit.    PAST MEDICAL HISTORY: Past Medical History:  Diagnosis Date  . Allergy    takes Claritin daily and Flonase daily  . Arthritis   . Asthma    uses Albuterol daily as needed;Singulair nightly;DUlera daily  . Back pain   . Constipation   . Fatty liver   . Food allergy   . GERD (gastroesophageal reflux disease)    occasionally will take OTC meds but states she just watches what she eats  . H/O hiatal hernia   . History of bronchitis    last time 81yrs ago  . History of colon polyps   . History of kidney stones   . History of shingles   . Hyperlipidemia   . Hypertension    takes Hyzaar daily  . Joint  pain   . Joint pain   . Joint swelling   . Obesity   . Osteoarthritis   . Pneumonia    hx of;last time at least 61yrs ago  . PONV (postoperative nausea and vomiting)   . Prediabetes   . Shortness of breath   . Sleep apnea   . Swelling of lower extremity   . Trouble in sleeping   . Wheezing     PAST SURGICAL HISTORY: Past Surgical History:  Procedure Laterality Date  . BREAST EXCISIONAL BIOPSY Right   . CARDIAC CATHETERIZATION    . COLONOSCOPY    . ESOPHAGOGASTRODUODENOSCOPY    . KNEE SURGERY Right    arthroscopy  . Right ganglion cyst     x 2  . right lumpectomy  around 1983  . STERIOD INJECTION Left 11/15/2013   Procedure: Marcaine/STEROID INJECTION;  Surgeon: Jenny Reichmann  Maudie Mercury, MD;  Location: [facility];  Service: Orthopedics;  Laterality: Left;  . TOTAL KNEE ARTHROPLASTY Right 11/15/2013   Procedure: RIGHT TOTAL KNEE ARTHROPLASTY;  Surgeon: Alta Corning, MD;  Location: [facility];  Service: Orthopedics;  Laterality: Right;  . TOTAL KNEE ARTHROPLASTY Left 12/30/2016   Procedure: TOTAL KNEE ARTHROPLASTY;  Surgeon: Dorna Leitz, MD;  Location: [place];  Service: Orthopedics;  Laterality: Left;  . TUBAL LIGATION      SOCIAL HISTORY: Social History   Tobacco Use  . Smoking status: Never Smoker  . Smokeless tobacco: Never Used  Substance Use Topics  . Alcohol use: No    Alcohol/week: 0.0 standard drinks  . Drug use: No    FAMILY HISTORY: Family History  Problem Relation Age of Onset  . Diabetes Father   . Cancer Father   . Liver disease Father   . Alcoholism Father   . Sarcoidosis Mother   . Glaucoma Mother   . Hypertension Mother   . Hyperlipidemia Mother   . Sleep apnea Mother   . Obesity Mother   . Breast cancer Neg Hx     ROS: Review of Systems  Constitutional: Negative for weight loss.  Gastrointestinal: Negative for nausea and vomiting.  Musculoskeletal:       Negative for muscle weakness  Endo/Heme/Allergies:       Negative for polyphagia    PHYSICAL  EXAM: Blood pressure (!) 147/78, pulse 72, temperature 98.3 F (36.8 C), temperature source Oral, height 5\' 1"  (1.549 m), weight 177 lb (80.3 kg), last menstrual period 02/20/2014, SpO2 100 %. Body mass index is 33.44 kg/m. Physical Exam Vitals reviewed.  Constitutional:      General: She is not in acute distress.    Appearance: Normal appearance. She is well-developed. She is obese.  Cardiovascular:     Rate and Rhythm: Normal rate.  Pulmonary:     Effort: Pulmonary effort is normal.  Musculoskeletal:        General: Normal range of motion.  Skin:    General: Skin is warm and dry.  Neurological:     Mental Status: She is alert and oriented to person, place, and time.  Psychiatric:        Mood and Affect: Mood normal.        Behavior: Behavior normal.     RECENT LABS AND TESTS: BMET    Component Value Date/Time   NA 141 05/06/2019 0845   K 3.9 05/06/2019 0845   CL 102 05/06/2019 0845   CO2 28 05/06/2019 0845   GLUCOSE 97 05/06/2019 0845   GLUCOSE 80 07/02/2018 1544   BUN 14 05/06/2019 0845   CREATININE 0.48 (L) 05/06/2019 0845   CALCIUM 9.5 05/06/2019 0845   GFRNONAA 110 05/06/2019 0845   GFRAA 127 05/06/2019 0845   Lab Results  Component Value Date   HGBA1C 5.4 05/06/2019   HGBA1C 5.9 (H) 01/14/2019   HGBA1C 5.9 (H) 08/30/2018   HGBA1C 6.1 06/08/2017   HGBA1C 6.1 06/15/2016   Lab Results  Component Value Date   INSULIN 9.8 05/06/2019   INSULIN 8.8 01/14/2019   INSULIN 9.3 08/30/2018   CBC    Component Value Date/Time   WBC 9.8 07/02/2018 1544   RBC 4.76 07/02/2018 1544   HGB 13.7 07/02/2018 1544   HCT 41.8 07/02/2018 1544   PLT 125.0 (L) 07/02/2018 1544   MCV 87.9 07/02/2018 1544   MCH 28.8 07/07/2017 1207   MCHC 32.8 07/02/2018 1544   RDW 14.0  07/02/2018 1544   LYMPHSABS 2.7 07/02/2018 1544   MONOABS 1.0 07/02/2018 1544   EOSABS 0.2 07/02/2018 1544   BASOSABS 0.1 07/02/2018 1544   Iron/TIBC/Ferritin/ %Sat No results found for: IRON, TIBC,  FERRITIN, IRONPCTSAT Lipid Panel     Component Value Date/Time   CHOL 196 05/06/2019 0845   TRIG 51 05/06/2019 0845   HDL 64 05/06/2019 0845   CHOLHDL 4 10/04/2018 1457   VLDL 13.8 10/04/2018 1457   LDLCALC 122 (H) 05/06/2019 0845   LDLDIRECT 147.9 12/17/2012 1604   Hepatic Function Panel     Component Value Date/Time   PROT 6.8 05/06/2019 0845   ALBUMIN 4.5 05/06/2019 0845   AST 20 05/06/2019 0845   ALT 33 (H) 05/06/2019 0845   ALKPHOS 161 (H) 05/06/2019 0845   BILITOT <0.2 05/06/2019 0845   BILIDIR 0.1 01/02/2012 1626   IBILI 0.3 01/05/2010 2038      Component Value Date/Time   TSH 2.36 07/02/2018 1544   TSH 1.26 06/08/2017 1457   TSH 0.93 06/15/2016 1359     Ref. Range 05/06/2019 08:45  Vitamin D, 25-Hydroxy Latest Ref Range: 30.0 - 100.0 ng/mL 52.3    I, Doreene Nest, am acting as Location manager for [employer], FNP-C  I have reviewed the above documentation for accuracy and completeness, and I agree with the above.  - Mionna Advincula, FNP-C.

## 2019-06-19 ENCOUNTER — Encounter (INDEPENDENT_AMBULATORY_CARE_PROVIDER_SITE_OTHER): Payer: Self-pay | Admitting: Family Medicine

## 2019-06-19 DIAGNOSIS — E8881 Metabolic syndrome: Secondary | ICD-10-CM | POA: Insufficient documentation

## 2019-06-19 DIAGNOSIS — E88819 Insulin resistance, unspecified: Secondary | ICD-10-CM | POA: Insufficient documentation

## 2019-06-21 MED FILL — LOSARTAN-HCTZ 100-25 MG TAB: 100-25 | 30 days supply | Qty: 30 | Fill #11

## 2019-06-24 DIAGNOSIS — M25512 Pain in left shoulder: Secondary | ICD-10-CM | POA: Diagnosis not present

## 2019-07-08 ENCOUNTER — Other Ambulatory Visit: Payer: Self-pay

## 2019-07-08 ENCOUNTER — Encounter: Payer: Self-pay | Admitting: Family Medicine

## 2019-07-08 ENCOUNTER — Ambulatory Visit (INDEPENDENT_AMBULATORY_CARE_PROVIDER_SITE_OTHER): Payer: 59 | Admitting: Family Medicine

## 2019-07-08 VITALS — BP 128/82 | HR 78 | Temp 97.1°F | Ht 60.75 in | Wt 180.3 lb

## 2019-07-08 DIAGNOSIS — I10 Essential (primary) hypertension: Secondary | ICD-10-CM

## 2019-07-08 DIAGNOSIS — E7800 Pure hypercholesterolemia, unspecified: Secondary | ICD-10-CM

## 2019-07-08 DIAGNOSIS — E6609 Other obesity due to excess calories: Secondary | ICD-10-CM | POA: Diagnosis not present

## 2019-07-08 DIAGNOSIS — Z6834 Body mass index (BMI) 34.0-34.9, adult: Secondary | ICD-10-CM | POA: Diagnosis not present

## 2019-07-08 DIAGNOSIS — Z0000 Encounter for general adult medical examination without abnormal findings: Secondary | ICD-10-CM

## 2019-07-08 DIAGNOSIS — E559 Vitamin D deficiency, unspecified: Secondary | ICD-10-CM

## 2019-07-08 DIAGNOSIS — R7303 Prediabetes: Secondary | ICD-10-CM

## 2019-07-08 DIAGNOSIS — D696 Thrombocytopenia, unspecified: Secondary | ICD-10-CM | POA: Insufficient documentation

## 2019-07-08 LAB — COMPREHENSIVE METABOLIC PANEL
ALT: 31 U/L (ref 0–35)
AST: 21 U/L (ref 0–37)
Albumin: 4.4 g/dL (ref 3.5–5.2)
Alkaline Phosphatase: 115 U/L (ref 39–117)
BUN: 16 mg/dL (ref 6–23)
CO2: 32 mEq/L (ref 19–32)
Calcium: 10.1 mg/dL (ref 8.4–10.5)
Chloride: 101 mEq/L (ref 96–112)
Creatinine, Ser: 0.64 mg/dL (ref 0.40–1.20)
GFR: 115.79 mL/min (ref >60.00)
Glucose, Bld: 100 mg/dL — ABNORMAL HIGH (ref 70–99)
Potassium: 4 mEq/L (ref 3.5–5.1)
Sodium: 141 mEq/L (ref 135–145)
Total Bilirubin: 0.5 mg/dL (ref 0.2–1.2)
Total Protein: 7.5 g/dL (ref 6.0–8.3)

## 2019-07-08 LAB — CBC WITH DIFFERENTIAL/PLATELET
Basophils Absolute: 0 10*3/uL (ref 0.0–0.1)
Basophils Relative: 0.9 % (ref 0.0–3.0)
Eosinophils Absolute: 0.1 10*3/uL (ref 0.0–0.7)
Eosinophils Relative: 1.8 % (ref 0.0–5.0)
HCT: 39.6 % (ref 36.0–46.0)
Hemoglobin: 13.2 g/dL (ref 12.0–15.0)
Lymphocytes Relative: 36.1 % (ref 12.0–46.0)
Lymphs Abs: 1.8 10*3/uL (ref 0.7–4.0)
MCHC: 33.5 g/dL (ref 30.0–36.0)
MCV: 87.5 fl (ref 78.0–100.0)
Monocytes Absolute: 0.5 10*3/uL (ref 0.1–1.0)
Monocytes Relative: 9.7 % (ref 3.0–12.0)
Neutro Abs: 2.6 10*3/uL (ref 1.4–7.7)
Neutrophils Relative %: 51.5 % (ref 43.0–77.0)
Platelets: 110 10*3/uL — ABNORMAL LOW (ref 150.0–400.0)
RBC: 4.52 Mil/uL (ref 3.87–5.11)
RDW: 14.6 % (ref 11.5–15.5)
WBC: 5 10*3/uL (ref 4.0–10.5)

## 2019-07-08 LAB — TSH: TSH: 0.84 u[IU]/mL (ref 0.35–4.50)

## 2019-07-08 LAB — LIPID PANEL
Cholesterol: 223 mg/dL — ABNORMAL HIGH (ref 0–200)
HDL: 59.5 mg/dL (ref >39.00)
LDL Cholesterol: 150 mg/dL — ABNORMAL HIGH (ref 0–99)
NonHDL: 163.5
Total CHOL/HDL Ratio: 4
Triglycerides: 69 mg/dL (ref 0.0–149.0)
VLDL: 13.8 mg/dL (ref 0.0–40.0)

## 2019-07-08 MED ORDER — LOSARTAN POTASSIUM-HCTZ 100-25 MG PO TABS: 1.0000 | ORAL_TABLET | Freq: Every day | ORAL | 3 refills | Status: DC

## 2019-07-08 MED FILL — metFORMIN HCL 500 MG TABS: 500 | 30 days supply | Qty: 60 | Fill #0

## 2019-07-08 NOTE — Assessment & Plan Note (Signed)
Commended on wt loss so far with clinic Discussed how this problem influences overall health and the risks it imposes  Reviewed plan for weight loss with lower calorie diet (via better food choices and also portion control or program like weight watchers) and exercise building up to or more than 30 minutes 5 days per week including some aerobic activity

## 2019-07-08 NOTE — Patient Instructions (Addendum)
Call and schedule your colonoscopy   Take care of yourself  Keep loosing weight and exercising   Labs today

## 2019-07-08 NOTE — Assessment & Plan Note (Signed)
Now on metformin from the [facility] and doing well Lab Results  Component Value Date   HGBA1C 5.4 05/06/2019

## 2019-07-08 NOTE — Assessment & Plan Note (Signed)
Supplemented now and level improved at 52.3 Disc imp to bone and overall health

## 2019-07-08 NOTE — Assessment & Plan Note (Signed)
Reviewed health habits including diet and exercise and skin cancer prevention Reviewed appropriate screening tests for age  Also reviewed health mt list, fam hx and immunization status , as well as social and family history   See HPI Rev most recent labs and ordered remaining Cannot have flu shot due to allergy  Unsure if interested in shingrix -she will d/w her pulmonologist  utd gyn care and pap  Due for colonoscopy (was cancelled once due to pandemic) -she plans to re schedule  Doing well with wt loss going [facility]- encouraged

## 2019-07-08 NOTE — Assessment & Plan Note (Signed)
bp in fair control at this time  BP Readings from Last 1 Encounters:  07/08/19 128/82   No changes needed Most recent labs reviewed  Disc lifstyle change with low sodium diet and exercise  Encouraged further weight loss

## 2019-07-08 NOTE — Assessment & Plan Note (Signed)
Disc goals for lipids and reasons to control them Rev last labs with pt Rev low sat fat diet in detail Lab today  Eating better now -expect improvement

## 2019-07-08 NOTE — Progress Notes (Signed)
Subjective:    Patient ID: Katherine Tanner, female    DOB: 07-Jan-1961, 59 y.o.   MRN: [id number]  This visit occurred during the SARS-CoV-2 public health emergency.  Safety protocols were in place, including screening questions prior to the visit, additional usage of staff PPE, and extensive cleaning of exam room while observing appropriate contact time as indicated for disinfecting solutions.    HPI Here for health maintenance exam and to review chronic medical problems    Feeling fine overall    Wt Readings from Last 3 Encounters:  07/08/19 180 lb 5 oz (81.8 kg)  06/17/19 177 lb (80.3 kg)  05/27/19 177 lb (80.3 kg)  doing well with her weight loss  34.35 kg/m  [facility]  Next goal is below 170   Rides bike for exercise   Not good weather for walking    Dealing with R rotator cuff tendonitis - seeing Dr Berenice Primas  Doing some exercises    Mammogram 7/20  Self breast exam - no lumps   Tdap 6/13 Flu shot -cannot take (she had anaphylaxis)   Zoster status -afraid to get it (she has had shingles before0    Pap 11/20 -neg with neg HPV    Colonoscopy 12/14 - she is due a 5 y recall   - they had to cancel  She will call back to schedule  Sees Dr Watt Climes   bp is stable today  No cp or palpitations or headaches or edema  No side effects to medicines  BP Readings from Last 3 Encounters:  07/08/19 128/82  06/17/19 (!) 147/78  05/27/19 114/70     Due for labs  H/o hyperlipidemia Lab Results  Component Value Date   CHOL 196 05/06/2019   HDL 64 05/06/2019   LDLCALC 122 (H) 05/06/2019   LDLDIRECT 147.9 12/17/2012   TRIG 51 05/06/2019   CHOLHDL 4 10/04/2018  is eating differently now    prediabetes/insulin resistance Lab Results  Component Value Date   HGBA1C 5.4 05/06/2019   [facility] (doing fine with that)  Her glucose in nov was 51  H/o low vit D in the past Improved in November with  level of 52.3 on current supplementation  Taking vit D   H/o elevated alk phos  Most likely hereditary   Patient Active Problem List   Diagnosis Date Noted  . Insulin resistance 06/19/2019  . Vitamin D deficiency 11/05/2018  . Obstructive sleep apnea 10/10/2017  . Primary osteoarthritis of left knee 12/30/2016  . Obesity 05/24/2016  . Prediabetes 02/24/2014  . Osteoarthritis of right knee 11/15/2013  . Osteoarthritis of left knee 11/15/2013  . Alkaline phosphatase elevation 01/02/2012  . Hyperlipidemia 01/02/2012  . Routine general medical examination at a health care facility 12/09/2011  . 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 POLYARTHRITIS 01/28/2010  . ANGIOEDEMA 03/27/2007  . Essential hypertension 03/19/2007  . Perennial allergic rhinitis with seasonal variation 03/19/2007  . Moderate intermittent asthma 03/19/2007  . GERD 03/19/2007  . HIATAL HERNIA 03/19/2007  . GESTATIONAL DIABETES 03/19/2007   Past Medical History:  Diagnosis Date  . Allergy    takes Claritin daily and Flonase daily  . Arthritis   . Asthma    uses Albuterol daily as needed;Singulair nightly;DUlera daily  . Back pain   . Constipation   . Fatty liver   . Food allergy   . GERD (gastroesophageal reflux disease)    occasionally  will take OTC meds but states she just watches what she eats  . H/O hiatal hernia   . History of bronchitis    last time 79yr ago  . History of colon polyps   . History of kidney stones   . History of shingles   . Hyperlipidemia   . Hypertension    takes Hyzaar daily  . Joint pain   . Joint pain   . Joint swelling   . Obesity   . Osteoarthritis   . Pneumonia    hx of;last time at least 89yrago  . PONV (postoperative nausea and vomiting)   . Prediabetes   . Shortness of breath   . Sleep apnea   . Swelling of lower extremity   . Trouble in sleeping   . Wheezing    Past Surgical History:  Procedure Laterality Date  . BREAST EXCISIONAL BIOPSY Right     . CARDIAC CATHETERIZATION    . COLONOSCOPY    . ESOPHAGOGASTRODUODENOSCOPY    . KNEE SURGERY Right    arthroscopy  . Right ganglion cyst     x 2  . right lumpectomy  around 1983  . STERIOD INJECTION Left 11/15/2013   Procedure: Marcaine/STEROID INJECTION;  Surgeon: JoAlta CorningMD;  [facility];  Laterality: Left;  . TOTAL KNEE ARTHROPLASTY Right 11/15/2013   Procedure: RIGHT TOTAL KNEE ARTHROPLASTY;  Surgeon: JoAlta CorningMD;  [facility];  Laterality: Right;  . TOTAL KNEE ARTHROPLASTY Left 12/30/2016   Procedure: TOTAL KNEE ARTHROPLASTY;  Surgeon: GrDorna LeitzMD;  [facility];  Laterality: Left;  . TUBAL LIGATION     Social History   Tobacco Use  . Smoking status: Never Smoker  . Smokeless tobacco: Never Used  Substance Use Topics  . Alcohol use: No    Alcohol/week: 0.0 standard drinks  . Drug use: No   Family History  Problem Relation Age of Onset  . Diabetes Father   . Cancer Father   . Liver disease Father   . Alcoholism Father   . Sarcoidosis Mother   . Glaucoma Mother   . Hypertension Mother   . Hyperlipidemia Mother   . Sleep apnea Mother   . Obesity Mother   . Breast cancer Neg Hx    Allergies  Allergen Reactions  . Ace Inhibitors Shortness Of Breath and Cough    Cough/ breathing problems   . Amoxicillin-Pot Clavulanate Swelling    SWELLING REACTION UNSPECIFIED   . Influenza Virus Vacc Split Pf     UNSPECIFIED REACTION   . Shellfish Allergy Itching   Current Outpatient Medications on File Prior to Visit  Medication Sig Dispense Refill  . albuterol (VENTOLIN HFA) 108 (90 Base) MCG/ACT inhaler INHALE 2 PUFFS BY MOUTH INTO THE LUNGS EVERY 6 HOURS AS NEEDED FOR WHEEZING OR SHORTNESS OF BREATH. 18 g 12  . Calcium Citrate-Vitamin D (CALCIUM + D PO) Take 1 tablet by mouth daily.    . Marland KitchenPINEPHRINE 0.3 mg/0.3 mL IJ SOAJ injection INJECT 1 SYRINGE INTO THE MUSCLE ONCE 1 each 1  .  fluticasone (FLONASE) 50 MCG/ACT nasal spray Place 2 sprays into both nostrils daily. 48 g 3  . Fluticasone-Salmeterol (ADVAIR DISKUS) 250-50 MCG/DOSE AEPB INHALE 1 PUFF BY MOUTH TWICE DAILY THEN RINSE MOUTH 60 each 2  . ipratropium-albuterol (DUONEB) 0.5-2.5 (3) MG/3ML SOLN USE 1 VIAL VIA NEBULIZER EVERY 6 HOURS AS NEEDED 90 mL 5  .  loratadine (CLARITIN) 10 MG tablet Take 10 mg by mouth daily.     . metFORMIN (GLUCOPHAGE) 500 MG tablet Take 1 tablet (500 mg total) by mouth 2 (two) times daily with a meal. 60 tablet 0  . montelukast (SINGULAIR) 10 MG tablet Take 1 tablet (10 mg total) by mouth at bedtime. 90 tablet 3  . Multiple Vitamin (MULTIVITAMIN PO) Take 1 tablet by mouth daily.     . Nebulizers (COMPRESSOR/NEBULIZER) MISC Use as directed 1 each 0  . Vitamin D, Ergocalciferol, (DRISDOL) 1.25 MG (50000 UT) CAPS capsule Take 1 capsule (50,000 Units total) by mouth every 14 (fourteen) days. 6 capsule 0   No current facility-administered medications on file prior to visit.    Review of Systems  Constitutional: Negative for activity change, appetite change, fatigue, fever and unexpected weight change.  HENT: Negative for congestion, ear pain, rhinorrhea, sinus pressure and sore throat.   Eyes: Negative for pain, redness and visual disturbance.  Respiratory: Negative for cough, shortness of breath and wheezing.   Cardiovascular: Negative for chest pain and palpitations.  Gastrointestinal: Negative for abdominal pain, blood in stool, constipation and diarrhea.  Endocrine: Negative for polydipsia and polyuria.  Genitourinary: Negative for dysuria, frequency and urgency.  Musculoskeletal: Positive for arthralgias. Negative for back pain and myalgias.       Recent shoulder tendonitis   Skin: Negative for pallor and rash.  Allergic/Immunologic: Negative for environmental allergies.  Neurological: Negative for dizziness, syncope and headaches.  Hematological: Negative for adenopathy. Does not  bruise/bleed easily.  Psychiatric/Behavioral: Negative for decreased concentration and dysphoric mood. The patient is not nervous/anxious.        Objective:   Physical Exam Constitutional:      General: She is not in acute distress.    Appearance: Normal appearance. She is well-developed. She is obese. She is not ill-appearing or diaphoretic.  HENT:     Head: Normocephalic and atraumatic.     Right Ear: Tympanic membrane, ear canal and external ear normal.     Left Ear: Tympanic membrane, ear canal and external ear normal.     Nose: Nose normal. No congestion.     Mouth/Throat:     Mouth: Mucous membranes are moist.     Pharynx: Oropharynx is clear. No posterior oropharyngeal erythema.  Eyes:     General: No scleral icterus.    Extraocular Movements: Extraocular movements intact.     Conjunctiva/sclera: Conjunctivae normal.     Pupils: Pupils are equal, round, and reactive to light.  Neck:     Thyroid: No thyromegaly.     Vascular: No carotid bruit or JVD.  Cardiovascular:     Rate and Rhythm: Normal rate and regular rhythm.     Pulses: Normal pulses.     Heart sounds: Normal heart sounds. No gallop.   Pulmonary:     Effort: Pulmonary effort is normal. No respiratory distress.     Breath sounds: Normal breath sounds. No wheezing.     Comments: Good air exch Chest:     Chest wall: No tenderness.  Abdominal:     General: Bowel sounds are normal. There is no distension or abdominal bruit.     Palpations: Abdomen is soft. There is no mass.     Tenderness: There is no abdominal tenderness.     Hernia: No hernia is present.  Genitourinary:    Comments: Breast exam: No mass, nodules, thickening, tenderness, bulging, retraction, inflamation, nipple discharge or skin changes noted.  No axillary  or clavicular LA.     Musculoskeletal:        General: No tenderness. Normal range of motion.     Cervical back: Normal range of motion and neck supple. No rigidity. No muscular tenderness.      Right lower leg: No edema.     Left lower leg: No edema.     Comments: Limited shoulder rom   Lymphadenopathy:     Cervical: No cervical adenopathy.  Skin:    General: Skin is warm and dry.     Coloration: Skin is not pale.     Findings: No erythema or rash.  Neurological:     Mental Status: She is alert. Mental status is at baseline.     Cranial Nerves: No cranial nerve deficit.     Motor: No abnormal muscle tone.     Coordination: Coordination normal.     Gait: Gait normal.     Deep Tendon Reflexes: Reflexes are normal and symmetric. Reflexes normal.  Psychiatric:        Mood and Affect: Mood normal.        Cognition and Memory: Cognition and memory normal.           Assessment & Plan:   Problem List Items Addressed This Visit      Cardiovascular and Mediastinum   Essential hypertension    bp in fair control at this time  BP Readings from Last 1 Encounters:  07/08/19 128/82   No changes needed Most recent labs reviewed  Disc lifstyle change with low sodium diet and exercise  Encouraged further weight loss       Relevant Medications   losartan-hydrochlorothiazide (HYZAAR) 100-25 MG tablet   Other Relevant Orders   CBC w/Diff (Completed)   TSH (Completed)   Lipid panel (Completed)   Comprehensive metabolic panel (Completed)     Other   Routine general medical examination at a health care facility - Primary    Reviewed health habits including diet and exercise and skin cancer prevention Reviewed appropriate screening tests for age  Also reviewed health mt list, fam hx and immunization status , as well as social and family history   See HPI Rev most recent labs and ordered remaining Cannot have flu shot due to allergy  Unsure if interested in shingrix -she will d/w her pulmonologist  utd gyn care and pap  Due for colonoscopy (was cancelled once due to pandemic) -she plans to re schedule  Doing well with wt loss [facility]-  encouraged       Hyperlipidemia    Disc goals for lipids and reasons to control them Rev last labs with pt Rev low sat fat diet in detail Lab today  Eating better now -expect improvement       Relevant Medications   losartan-hydrochlorothiazide (HYZAAR) 100-25 MG tablet   Other Relevant Orders   Lipid panel (Completed)   Prediabetes    Now on metformin from the [facility] and doing well Lab Results  Component Value Date   HGBA1C 5.4 05/06/2019         Obesity    Commended on wt loss so far with clinic Discussed how this problem influences overall health and the risks it imposes  Reviewed plan for weight loss with lower calorie diet (via better food choices and also portion control or program like weight watchers) and exercise building up to or more than 30 minutes 5 days per week  including some aerobic activity         Vitamin D deficiency    Supplemented now and level improved at 52.3 Disc imp to bone and overall health

## 2019-07-09 ENCOUNTER — Ambulatory Visit (INDEPENDENT_AMBULATORY_CARE_PROVIDER_SITE_OTHER): Payer: 59 | Admitting: Bariatrics

## 2019-07-09 ENCOUNTER — Telehealth: Payer: Self-pay | Admitting: *Deleted

## 2019-07-09 ENCOUNTER — Encounter: Payer: Self-pay | Admitting: Family Medicine

## 2019-07-09 VITALS — BP 128/72 | HR 80 | Temp 98.8°F | Ht 61.0 in | Wt 179.0 lb

## 2019-07-09 DIAGNOSIS — E669 Obesity, unspecified: Secondary | ICD-10-CM

## 2019-07-09 DIAGNOSIS — E559 Vitamin D deficiency, unspecified: Secondary | ICD-10-CM | POA: Diagnosis not present

## 2019-07-09 DIAGNOSIS — Z6833 Body mass index (BMI) 33.0-33.9, adult: Secondary | ICD-10-CM | POA: Diagnosis not present

## 2019-07-09 DIAGNOSIS — Z9189 Other specified personal risk factors, not elsewhere classified: Secondary | ICD-10-CM

## 2019-07-09 DIAGNOSIS — E7849 Other hyperlipidemia: Secondary | ICD-10-CM

## 2019-07-09 DIAGNOSIS — E8881 Metabolic syndrome: Secondary | ICD-10-CM | POA: Diagnosis not present

## 2019-07-09 NOTE — Telephone Encounter (Signed)
Left VM requesting pt to call the office back regarding lab results  

## 2019-07-10 NOTE — Progress Notes (Signed)
Chief Complaint: OBESITY Katherine Tanner is here to discuss her progress with her obesity treatment plan along with follow-up of her obesity related diagnoses. Katherine Tanner is on the Category 2 Plan and states she is following her eating plan approximately 85-90% of the time. Katherine Tanner states she is biking 20 minutes 3 times per week and using bands 10 minutes 3 times per week.  Today's visit was #: 88 Starting weight: 198 lbs Starting date: 08/30/2018 Today's weight: 179 lbs Today's date: 07/09/2019 Total lbs lost to date: 19  Total lbs lost since last in-office visit: 0  Interim History: Katherine Tanner is up 2 lbs. She last saw me on 02/04/2019 and subsequent to this she has seen Dr. Adair Patter and Arrie Aran.  Subjective:   Insulin resistance. Katherine Tanner reports a normal appetite. Last A1c 5.4 on 05/06/2019 with an insulin of 9.8.  Vitamin D deficiency. Katherine Tanner is taking Vitamin D every 14 days. Last Vitamin D 52.3 on 05/06/2019.  Other hyperlipidemia. Katherine Tanner is on no medications. Last cholesterol 223 on 07/08/2019 with an LDL of 150.  At risk for heart disease. Katherine Tanner was given (~15 minutes) coronary artery disease prevention counseling today. She is 59 y.o. female and has risk factors for heart disease including obesity. We discussed intensive lifestyle modifications today with an emphasis on specific weight loss instructions and strategies.    Assessment/Plan:   Insulin resistance. Katherine Tanner will decrease carbohydrates, increase healthy fats and protein, and will continue to cycle.  Vitamin D deficiency. Katherine Tanner will continue high dose Vitamin D every 14 days.  Other hyperlipidemia. Katherine Tanner will cut out red meats and eat white meats with no skin. She will use Red Yeast Rice and Omega 3 fatty acids. Increase MUFA's and PUFA's.   At risk for heart disease. Katherine Tanner was given (~15 minutes) coronary artery disease prevention counseling today. She is 59 y.o. female and has risk factors for heart disease  including obesity. We discussed intensive lifestyle modifications today with an emphasis on specific weight loss instructions and strategies.   Class 1 obesity with serious comorbidity and body mass index (BMI) of 33.0 to 33.9 in adult, unspecified obesity type.  Katherine Tanner is currently in the action stage of change. As such, her goal is to continue with weight loss efforts. She has agreed to Category 2 Plan. She will work on meal planning and intentional eating.  We reviewed her labs including CMP, lipids, CBC, and glucose.  We discussed the following exercise goals today: Katherine Tanner will continue her current exercise regimen.  We discussed the following behavioral modification strategies today: increasing lean protein intake, decreasing simple carbohydrates, increasing vegetables, increasing water intake, decreasing eating out, no skipping meals, meal planning and cooking strategies, keeping healthy foods in the home and planning for success.  Katherine Tanner has agreed to follow-up with our clinic in 2-3 weeks. She was informed of the importance of frequent follow-up visits to maximize her success with intensive lifestyle modifications for her multiple health conditions.  Objective:   Blood pressure 128/72, pulse 80, temperature 98.8 F (37.1 C), height 5\' 1"  (1.549 m), weight 179 lb (81.2 kg), last menstrual period 02/20/2014, SpO2 100 %. Body mass index is 33.82 kg/m.  General: Cooperative, alert, well developed, in no acute distress. HEENT: Conjunctivae and lids unremarkable. Neck: No thyromegaly.  Cardiovascular: Regular rhythm.  Lungs: Normal work of breathing. Extremities: No edema.  Neurologic: No focal deficits.   Lab Results  Component Value Date   CREATININE 0.64 07/08/2019   BUN 16  07/08/2019   NA 141 07/08/2019   K 4.0 07/08/2019   CL 101 07/08/2019   CO2 32 07/08/2019   Lab Results  Component Value Date   ALT 31 07/08/2019   AST 21 07/08/2019   ALKPHOS 115  07/08/2019   BILITOT 0.5 07/08/2019   Lab Results  Component Value Date   HGBA1C 5.4 05/06/2019   HGBA1C 5.9 (H) 01/14/2019   HGBA1C 5.9 (H) 08/30/2018   HGBA1C 6.1 06/08/2017   HGBA1C 6.1 06/15/2016   Lab Results  Component Value Date   INSULIN 9.8 05/06/2019   INSULIN 8.8 01/14/2019   INSULIN 9.3 08/30/2018   Lab Results  Component Value Date   TSH 0.84 07/08/2019   Lab Results  Component Value Date   CHOL 223 (H) 07/08/2019   HDL 59.50 07/08/2019   LDLCALC 150 (H) 07/08/2019   LDLDIRECT 147.9 12/17/2012   TRIG 69.0 07/08/2019   CHOLHDL 4 07/08/2019   Lab Results  Component Value Date   WBC 5.0 07/08/2019   HGB 13.2 07/08/2019   HCT 39.6 07/08/2019   MCV 87.5 07/08/2019   PLT 110.0 (L) 07/08/2019   No results found for: IRON, TIBC, FERRITIN  Attestation Statements:   Reviewed by clinician on day of visit: allergies, medications, problem list, medical history, surgical history, family history, social history and previous encounter notes.  This visit occurred during the SARS-CoV-2 public health emergency. Safety protocols were in place, including screening questions prior to the visit, additional usage of staff PPE, and extensive cleaning of exam room while observing appropriate contact time as indicated for disinfecting solutions. (CPT [id number])  I, Michaelene Song, am acting as transcriptionist for [employer], DO  I have reviewed the above documentation for accuracy and completeness, and I agree with the above. Jearld Lesch, DO

## 2019-07-11 DIAGNOSIS — M67912 Unspecified disorder of synovium and tendon, left shoulder: Secondary | ICD-10-CM | POA: Diagnosis not present

## 2019-07-13 ENCOUNTER — Encounter (INDEPENDENT_AMBULATORY_CARE_PROVIDER_SITE_OTHER): Payer: Self-pay | Admitting: Bariatrics

## 2019-07-13 MED FILL — HYDROCODON-APAP 5-325: 5-325 | 5 days supply | Qty: 15 | Fill #0

## 2019-07-22 MED FILL — ADVAIR 250/50 DISKUS: 250-50 | 60 days supply | Qty: 60 | Fill #1

## 2019-07-22 MED FILL — MONTELUKAST SOD 10 MG TAB: 10 | 90 days supply | Qty: 90 | Fill #1

## 2019-07-22 MED FILL — LOSARTAN-HCTZ 100-25 MG TAB: 100-25 | 30 days supply | Qty: 30 | Fill #0

## 2019-07-22 NOTE — Telephone Encounter (Signed)
I have not heard of any cross sensitivity between the current Covid vaccines from [place] and [place], with flu shots.  For people with an allergy history, I have suggested pre-treating with an antihistamine like benadryl, claritin, zyrtec about an hour before you get your shot. This would help blunt any possible allergic reaction. So far serious allergic reactions to these covid vaccines have been really rare.

## 2019-07-22 NOTE — Telephone Encounter (Signed)
Dr. Annamaria Boots, please see pt's mychart message and advise on it for her. Thanks!

## 2019-07-29 ENCOUNTER — Encounter (INDEPENDENT_AMBULATORY_CARE_PROVIDER_SITE_OTHER): Payer: Self-pay | Admitting: Family Medicine

## 2019-07-29 ENCOUNTER — Ambulatory Visit (INDEPENDENT_AMBULATORY_CARE_PROVIDER_SITE_OTHER): Payer: 59 | Admitting: Family Medicine

## 2019-07-29 ENCOUNTER — Other Ambulatory Visit: Payer: Self-pay

## 2019-07-29 VITALS — BP 151/79 | HR 65 | Temp 98.4°F | Ht 61.0 in | Wt 177.0 lb

## 2019-07-29 DIAGNOSIS — Z9189 Other specified personal risk factors, not elsewhere classified: Secondary | ICD-10-CM | POA: Diagnosis not present

## 2019-07-29 DIAGNOSIS — E559 Vitamin D deficiency, unspecified: Secondary | ICD-10-CM | POA: Diagnosis not present

## 2019-07-29 DIAGNOSIS — I10 Essential (primary) hypertension: Secondary | ICD-10-CM | POA: Diagnosis not present

## 2019-07-29 DIAGNOSIS — E8881 Metabolic syndrome: Secondary | ICD-10-CM

## 2019-07-29 DIAGNOSIS — E669 Obesity, unspecified: Secondary | ICD-10-CM

## 2019-07-29 DIAGNOSIS — Z6833 Body mass index (BMI) 33.0-33.9, adult: Secondary | ICD-10-CM

## 2019-07-29 DIAGNOSIS — E7849 Other hyperlipidemia: Secondary | ICD-10-CM

## 2019-07-29 NOTE — Progress Notes (Signed)
Chief Complaint:   OBESITY Katherine Tanner is here to discuss her progress with her obesity treatment plan along with follow-up of her obesity related diagnoses. Katherine Tanner is on the Category 2 Plan and states she is following her eating plan approximately 90% of the time. Katherine Tanner states she is biking, stretching, and using light weights for  30 minutes 3 times per week.  Today's visit was #: 20 Starting weight: 198 lbs Starting date: 08/30/2018 Today's weight: 177 lbs Today's date: 07/29/2019 Total lbs lost to date: 21 lbs Total lbs lost since last in-office visit: 2 lbs  Interim History: Katherine Tanner says she is following the plan well at breakfast and dinner.  She is eating a microwave meal for lunch (works as a Quarry manager at [employer]).  She feels that her weight loss is slowing.  She has a lot of joint pain, which she says makes it harder to exercise.  Subjective:   1. Vitamin D deficiency Katherine Tanner's Vitamin D level was 52.3 on 05/06/2019. She is currently taking prescription vit D every 14 days. She denies nausea, vomiting or muscle weakness.  2. Other hyperlipidemia Katherine Tanner has hyperlipidemia and has been trying to improve her cholesterol levels with intensive lifestyle modification including a low saturated fat diet, exercise and weight loss. She denies any chest pain, claudication or myalgias.  Hyperlipidemia is currently not at goal.     Lab Results  Component Value Date   ALT 31 07/08/2019   AST 21 07/08/2019   ALKPHOS 115 07/08/2019   BILITOT 0.5 07/08/2019   Lab Results  Component Value Date   CHOL 223 (H) 07/08/2019   HDL 59.50 07/08/2019   LDLCALC 150 (H) 07/08/2019   LDLDIRECT 147.9 12/17/2012   TRIG 69.0 07/08/2019   CHOLHDL 4 07/08/2019   3. Insulin resistance Katherine Tanner has a diagnosis of insulin resistance based on her elevated fasting insulin level >5. She continues to work on diet and exercise to decrease her risk of diabetes.  She denies polyphagia.  She is currently taking  metformin 500 mg twice daily.  Lab Results  Component Value Date   INSULIN 9.8 05/06/2019   INSULIN 8.8 01/14/2019   INSULIN 9.3 08/30/2018   Lab Results  Component Value Date   HGBA1C 5.4 05/06/2019   4. Essential hypertension Review: taking medications as instructed, no medication side effects noted.  She is currently taking Hyzaar, and she had just taken her medication prior to her appointment.  She reports that her home blood pressure is normal when she checks it.  BP Readings from Last 3 Encounters:  07/29/19 (!) 151/79  07/09/19 128/72  07/08/19 128/82   5. At risk for heart disease Katherine Tanner is at a higher than average risk for cardiovascular disease due to obesity. Reviewed: no chest pain on exertion, no dyspnea on exertion, and no swelling of ankles.  Assessment/Plan:   1. Vitamin D deficiency Low Vitamin D level contributes to fatigue and are associated with obesity, breast, and colon cancer. She agrees to continue to take prescription Vitamin D @50 ,000 IU every week and will follow-up for routine testing of Vitamin D, at least 2-3 times per year to avoid over-replacement.  Orders - Vitamin D, Ergocalciferol, (DRISDOL) 1.25 MG (50000 UNIT) CAPS capsule; Take 1 capsule (50,000 Units total) by mouth every 14 (fourteen) days.  Dispense: 6 capsule; Refill: 0  2. Other hyperlipidemia Cardiovascular risk and specific lipid/LDL goals reviewed.  We discussed several lifestyle modifications today and Hydi will continue to  work on diet, exercise and weight loss efforts. Orders and follow up as documented in patient record.  Recommend red yeast rice and fish oil supplements.  Counseling Intensive lifestyle modifications are the first line treatment for this issue. . Dietary changes: Increase soluble fiber. Decrease simple carbohydrates. . Exercise changes: Moderate to vigorous-intensity aerobic activity 150 minutes per week if tolerated. . Lipid-lowering medications: see  documented in medical record.  3. Insulin resistance Katherine Tanner will continue to work on weight loss, exercise, and decreasing simple carbohydrates to help decrease the risk of diabetes. Katherine Tanner agreed to follow-up with [name] as directed to closely monitor her progress.  4. Essential hypertension Katherine Tanner is working on healthy weight loss and exercise to improve blood pressure control. We will watch for signs of hypotension as she continues her lifestyle modifications.  5. At risk for heart disease Katherine Tanner was given approximately 15 minutes of coronary artery disease prevention counseling today. She is 59 y.o. female and has risk factors for heart disease including obesity. We discussed intensive lifestyle modifications today with an emphasis on specific weight loss instructions and strategies.   Repetitive spaced learning was employed today to elicit superior memory formation and behavioral change.  6. Class 1 obesity with serious comorbidity and body mass index (BMI) of 33.0 to 33.9 in adult, unspecified obesity type Katherine Tanner is currently in the action stage of change. As such, her goal is to continue with weight loss efforts. She has agreed to the Category 2 Plan and increase to 300-400 calories and 30 grams of protein at lunch or add a 100 calorie snack at midday.   Exercise goals: For substantial health benefits, adults should do at least 150 minutes (2 hours and 30 minutes) a week of moderate-intensity, or 75 minutes (1 hour and 15 minutes) a week of vigorous-intensity aerobic physical activity, or an equivalent combination of moderate- and vigorous-intensity aerobic activity. Aerobic activity should be performed in episodes of at least 10 minutes, and preferably, it should be spread throughout the week. Adults should also include muscle-strengthening activities that involve all major muscle groups on 2 or more days a week.  Behavioral modification strategies: increasing lean protein intake.  Katherine Tanner has  agreed to follow-up with our clinic in 2 weeks. She was informed of the importance of frequent follow-up visits to maximize her success with intensive lifestyle modifications for her multiple health conditions.   Objective:   Blood pressure (!) 151/79, pulse 65, temperature 98.4 F (36.9 C), temperature source Oral, height 5\' 1"  (1.549 m), weight 177 lb (80.3 kg), last menstrual period 02/20/2014, SpO2 100 %. Body mass index is 33.44 kg/m.  General: Cooperative, alert, well developed, in no acute distress. HEENT: Conjunctivae and lids unremarkable. Cardiovascular: Regular rhythm.  Lungs: Normal work of breathing. Neurologic: No focal deficits.   Lab Results  Component Value Date   CREATININE 0.64 07/08/2019   BUN 16 07/08/2019   NA 141 07/08/2019   K 4.0 07/08/2019   CL 101 07/08/2019   CO2 32 07/08/2019   Lab Results  Component Value Date   ALT 31 07/08/2019   AST 21 07/08/2019   ALKPHOS 115 07/08/2019   BILITOT 0.5 07/08/2019   Lab Results  Component Value Date   HGBA1C 5.4 05/06/2019   HGBA1C 5.9 (H) 01/14/2019   HGBA1C 5.9 (H) 08/30/2018   HGBA1C 6.1 06/08/2017   HGBA1C 6.1 06/15/2016   Lab Results  Component Value Date   INSULIN 9.8 05/06/2019   INSULIN 8.8 01/14/2019   INSULIN  9.3 08/30/2018   Lab Results  Component Value Date   TSH 0.84 07/08/2019   Lab Results  Component Value Date   CHOL 223 (H) 07/08/2019   HDL 59.50 07/08/2019   LDLCALC 150 (H) 07/08/2019   LDLDIRECT 147.9 12/17/2012   TRIG 69.0 07/08/2019   CHOLHDL 4 07/08/2019   Lab Results  Component Value Date   WBC 5.0 07/08/2019   HGB 13.2 07/08/2019   HCT 39.6 07/08/2019   MCV 87.5 07/08/2019   PLT 110.0 (L) 07/08/2019   Attestation Statements:   Reviewed by clinician on day of visit: allergies, medications, problem list, medical history, surgical history, family history, social history, and previous encounter notes.  I, Water quality scientist, CMA, am acting as Location manager for [employer], DO.  I have reviewed the above documentation for accuracy and completeness, and I agree with the above. Briscoe Deutscher, DO

## 2019-07-31 ENCOUNTER — Encounter (INDEPENDENT_AMBULATORY_CARE_PROVIDER_SITE_OTHER): Payer: Self-pay | Admitting: Family Medicine

## 2019-07-31 MED ORDER — VITAMIN D (ERGOCALCIFEROL) 1.25 MG (50000 UNIT) PO CAPS: 50000.0000 [IU] | ORAL_CAPSULE | ORAL | 0 refills | Status: DC

## 2019-08-04 ENCOUNTER — Other Ambulatory Visit (INDEPENDENT_AMBULATORY_CARE_PROVIDER_SITE_OTHER): Payer: Self-pay | Admitting: Family Medicine

## 2019-08-04 DIAGNOSIS — E88819 Insulin resistance, unspecified: Secondary | ICD-10-CM

## 2019-08-04 DIAGNOSIS — E8881 Metabolic syndrome: Secondary | ICD-10-CM

## 2019-08-05 ENCOUNTER — Other Ambulatory Visit (INDEPENDENT_AMBULATORY_CARE_PROVIDER_SITE_OTHER): Payer: Self-pay | Admitting: Family Medicine

## 2019-08-05 DIAGNOSIS — E8881 Metabolic syndrome: Secondary | ICD-10-CM

## 2019-08-05 MED ORDER — METFORMIN HCL 500 MG PO TABS: 500.0000 mg | ORAL_TABLET | Freq: Two times a day (BID) | ORAL | 0 refills | Status: DC

## 2019-08-05 MED FILL — metFORMIN HCL 500 MG TABS: 500 | 30 days supply | Qty: 60 | Fill #0

## 2019-08-12 ENCOUNTER — Ambulatory Visit (INDEPENDENT_AMBULATORY_CARE_PROVIDER_SITE_OTHER): Payer: 59 | Admitting: Physician Assistant

## 2019-08-12 ENCOUNTER — Other Ambulatory Visit: Payer: Self-pay

## 2019-08-12 ENCOUNTER — Encounter (INDEPENDENT_AMBULATORY_CARE_PROVIDER_SITE_OTHER): Payer: Self-pay | Admitting: Physician Assistant

## 2019-08-12 VITALS — BP 118/69 | HR 81 | Temp 98.2°F | Ht 61.0 in | Wt 177.0 lb

## 2019-08-12 DIAGNOSIS — E669 Obesity, unspecified: Secondary | ICD-10-CM | POA: Diagnosis not present

## 2019-08-12 DIAGNOSIS — E559 Vitamin D deficiency, unspecified: Secondary | ICD-10-CM

## 2019-08-12 DIAGNOSIS — Z6833 Body mass index (BMI) 33.0-33.9, adult: Secondary | ICD-10-CM

## 2019-08-13 NOTE — Progress Notes (Signed)
Chief Complaint:   OBESITY Katherine Tanner is here to discuss her progress with her obesity treatment plan along with follow-up of her obesity related diagnoses. Katherine Tanner is on the Category 2 Plan and states she is following her eating plan approximately 90-95% of the time. Katherine Tanner states she is biking/cardio 30 minutes 3 times per week.  Today's visit was #: 21 Starting weight: 198 lbs Starting date: 08/30/2018 Today's weight: 177 lbs Today's date: 08/12/2019 Total lbs lost to date: 21 Total lbs lost since last in-office visit: 0  Interim History: Katherine Tanner reports that she has added protein shakes to her diet since she wasn't getting all of her protein in during the day.  Subjective:   Vitamin D deficiency. Last Vitamin D level 52.3 on 05/06/2019. She is taking Vitamin D every 14 days. No nausea, vomiting, or muscle weakness.   Assessment/Plan:   Vitamin D deficiency. Low Vitamin D level contributes to fatigue and are associated with obesity, breast, and colon cancer. She agrees to continue to take Vitamin D and will follow-up for routine testing of Vitamin D, at least 2-3 times per year to avoid over-replacement.  Class 1 obesity with serious comorbidity and body mass index (BMI) of 33.0 to 33.9 in adult, unspecified obesity type.  Katherine Tanner is currently in the action stage of change. As such, her goal is to continue with weight loss efforts. She has agreed to the Category 2 Plan.   She will add more protein with breakfast and lunch.  Exercise goals: For substantial health benefits, adults should do at least 150 minutes (2 hours and 30 minutes) a week of moderate-intensity, or 75 minutes (1 hour and 15 minutes) a week of vigorous-intensity aerobic physical activity, or an equivalent combination of moderate- and vigorous-intensity aerobic activity. Aerobic activity should be performed in episodes of at least 10 minutes, and preferably, it should be spread throughout the  week.  Behavioral modification strategies: increasing lean protein intake and meal planning and cooking strategies.  Katherine Tanner has agreed to follow-up with our clinic in 2 weeks. She was informed of the importance of frequent follow-up visits to maximize her success with intensive lifestyle modifications for her multiple health conditions.   Objective:   Blood pressure 118/69, pulse 81, temperature 98.2 F (36.8 C), temperature source Oral, height 5\' 1"  (1.549 m), weight 177 lb (80.3 kg), last menstrual period 02/20/2014, SpO2 99 %. Body mass index is 33.44 kg/m.  General: Cooperative, alert, well developed, in no acute distress. HEENT: Conjunctivae and lids unremarkable. Cardiovascular: Regular rhythm.  Lungs: Normal work of breathing. Neurologic: No focal deficits.   Lab Results  Component Value Date   CREATININE 0.64 07/08/2019   BUN 16 07/08/2019   NA 141 07/08/2019   K 4.0 07/08/2019   CL 101 07/08/2019   CO2 32 07/08/2019   Lab Results  Component Value Date   ALT 31 07/08/2019   AST 21 07/08/2019   ALKPHOS 115 07/08/2019   BILITOT 0.5 07/08/2019   Lab Results  Component Value Date   HGBA1C 5.4 05/06/2019   HGBA1C 5.9 (H) 01/14/2019   HGBA1C 5.9 (H) 08/30/2018   HGBA1C 6.1 06/08/2017   HGBA1C 6.1 06/15/2016   Lab Results  Component Value Date   INSULIN 9.8 05/06/2019   INSULIN 8.8 01/14/2019   INSULIN 9.3 08/30/2018   Lab Results  Component Value Date   TSH 0.84 07/08/2019   Lab Results  Component Value Date   CHOL 223 (H) 07/08/2019  HDL 59.50 07/08/2019   LDLCALC 150 (H) 07/08/2019   LDLDIRECT 147.9 12/17/2012   TRIG 69.0 07/08/2019   CHOLHDL 4 07/08/2019   Lab Results  Component Value Date   WBC 5.0 07/08/2019   HGB 13.2 07/08/2019   HCT 39.6 07/08/2019   MCV 87.5 07/08/2019   PLT 110.0 (L) 07/08/2019   No results found for: IRON, TIBC, FERRITIN  Attestation Statements:   Reviewed by clinician on day of visit: allergies, medications,  problem list, medical history, surgical history, family history, social history, and previous encounter notes.  Time spent on visit including pre-visit chart review and post-visit care was 20 minutes.   Katherine Tanner, am acting as transcriptionist for Katherine Potash, PA-C   I have reviewed the above documentation for accuracy and completeness, and I agree with the above. Katherine Potash, PA-C

## 2019-08-19 MED FILL — MELOXICAM 15 MG TABLET: 15 | 30 days supply | Qty: 30 | Fill #0

## 2019-08-19 MED FILL — LOSARTAN-HCTZ 100-25 MG TAB: 100-25 | 30 days supply | Qty: 30 | Fill #1

## 2019-08-28 ENCOUNTER — Ambulatory Visit (INDEPENDENT_AMBULATORY_CARE_PROVIDER_SITE_OTHER): Payer: 59 | Admitting: Physician Assistant

## 2019-08-28 ENCOUNTER — Encounter (INDEPENDENT_AMBULATORY_CARE_PROVIDER_SITE_OTHER): Payer: Self-pay | Admitting: Physician Assistant

## 2019-08-28 ENCOUNTER — Other Ambulatory Visit: Payer: Self-pay

## 2019-08-28 VITALS — BP 120/69 | HR 77 | Temp 98.4°F | Ht 61.0 in | Wt 177.0 lb

## 2019-08-28 DIAGNOSIS — E669 Obesity, unspecified: Secondary | ICD-10-CM | POA: Diagnosis not present

## 2019-08-28 DIAGNOSIS — Z6833 Body mass index (BMI) 33.0-33.9, adult: Secondary | ICD-10-CM | POA: Diagnosis not present

## 2019-08-28 DIAGNOSIS — E559 Vitamin D deficiency, unspecified: Secondary | ICD-10-CM

## 2019-08-28 DIAGNOSIS — Z9189 Other specified personal risk factors, not elsewhere classified: Secondary | ICD-10-CM

## 2019-08-28 DIAGNOSIS — E8881 Metabolic syndrome: Secondary | ICD-10-CM

## 2019-08-28 MED ORDER — METFORMIN HCL 500 MG PO TABS: 500.0000 mg | ORAL_TABLET | Freq: Two times a day (BID) | ORAL | 0 refills | Status: DC

## 2019-08-28 MED ORDER — VITAMIN D (ERGOCALCIFEROL) 1.25 MG (50000 UNIT) PO CAPS: 50000.0000 [IU] | ORAL_CAPSULE | ORAL | 0 refills | Status: DC

## 2019-08-28 MED FILL — VIT D2 1.25 MG (50,000 UNIT: 1.25 MG | 84 days supply | Qty: 6 | Fill #0

## 2019-08-28 NOTE — Progress Notes (Signed)
Chief Complaint:   OBESITY Katherine Tanner is here to discuss her progress with her obesity treatment plan along with follow-up of her obesity related diagnoses. Katherine Tanner is on the Category 2 Plan and states she is following her eating plan approximately 90% of the time. Katherine Tanner states she is riding a bike for 30 minutes 3-4 times per week.  Today's visit was #: 22 Starting weight: 198 lbs Starting date: 08/30/2018 Today's weight: 177 lbs Today's date: 08/28/2019 Total lbs lost to date: 21 lbs Total lbs lost since last in-office visit: 0  Interim History: Katherine Tanner did very well with following the plan the last 2 weeks.  She denies excessive hunger. She has a [birth year] coming up.  Subjective:   1. Insulin resistance Katherine Tanner has a diagnosis of insulin resistance based on her elevated fasting insulin level >5. She continues to work on diet and exercise to decrease her risk of diabetes.  She is taking metformin 500 mg twice daily.  No nausea, vomiting, diarrhea.  No polyphagia.  Lab Results  Component Value Date   INSULIN 9.8 05/06/2019   INSULIN 8.8 01/14/2019   INSULIN 9.3 08/30/2018   Lab Results  Component Value Date   HGBA1C 5.4 05/06/2019   2. Vitamin D deficiency Katherine Tanner's Vitamin D level was 52.3 on 05/06/2019. She is currently taking vit D. She denies nausea, vomiting or muscle weakness.  3. At risk for diabetes mellitus Katherine Tanner is at higher than average risk for developing diabetes due to her obesity.   Assessment/Plan:   1. Insulin resistance Katherine Tanner will continue to work on weight loss, exercise, and decreasing simple carbohydrates to help decrease the risk of diabetes. Katherine Tanner agreed to follow-up with [name] as directed to closely monitor her progress. - metFORMIN (GLUCOPHAGE) 500 MG tablet; Take 1 tablet (500 mg total) by mouth 2 (two) times daily with a meal.  Dispense: 60 tablet; Refill: 0  2. Vitamin D deficiency Low Vitamin D level contributes to fatigue and are associated with  obesity, breast, and colon cancer. She agrees to continue to take prescription Vitamin D @50 ,000 IU every week and will follow-up for routine testing of Vitamin D, at least 2-3 times per year to avoid over-replacement. - Vitamin D, Ergocalciferol, (DRISDOL) 1.25 MG (50000 UNIT) CAPS capsule; Take 1 capsule (50,000 Units total) by mouth every 14 (fourteen) days.  Dispense: 6 capsule; Refill: 0  3. At risk for diabetes mellitus Katherine Tanner was given approximately 15 minutes of diabetes education and counseling today. We discussed intensive lifestyle modifications today with an emphasis on weight loss as well as increasing exercise and decreasing simple carbohydrates in her diet. We also reviewed medication options with an emphasis on risk versus benefit of those discussed.   Repetitive spaced learning was employed today to elicit superior memory formation and behavioral change.  4. Class 1 obesity with serious comorbidity and body mass index (BMI) of 33.0 to 33.9 in adult, unspecified obesity type Katherine Tanner is currently in the action stage of change. As such, her goal is to continue with weight loss efforts. She has agreed to following a lower carbohydrate, vegetable and lean protein rich diet plan.   Indirect calorimetry at next visit.  Exercise goals: As is.  Behavioral modification strategies: meal planning and cooking strategies and celebration eating strategies.  Katherine Tanner has agreed to follow-up with our clinic in 2 weeks. She was informed of the importance of frequent follow-up visits to maximize her success with intensive lifestyle modifications for her multiple health conditions.  Objective:   Blood pressure 120/69, pulse 77, temperature 98.4 F (36.9 C), temperature source Oral, height 5\' 1"  (1.549 m), weight 177 lb (80.3 kg), last menstrual period 02/20/2014, SpO2 98 %. Body mass index is 33.44 kg/m.  General: Cooperative, alert, well developed, in no acute distress. HEENT: Conjunctivae and  lids unremarkable. Cardiovascular: Regular rhythm.  Lungs: Normal work of breathing. Neurologic: No focal deficits.   Lab Results  Component Value Date   CREATININE 0.64 07/08/2019   BUN 16 07/08/2019   NA 141 07/08/2019   K 4.0 07/08/2019   CL 101 07/08/2019   CO2 32 07/08/2019   Lab Results  Component Value Date   ALT 31 07/08/2019   AST 21 07/08/2019   ALKPHOS 115 07/08/2019   BILITOT 0.5 07/08/2019   Lab Results  Component Value Date   HGBA1C 5.4 05/06/2019   HGBA1C 5.9 (H) 01/14/2019   HGBA1C 5.9 (H) 08/30/2018   HGBA1C 6.1 06/08/2017   HGBA1C 6.1 06/15/2016   Lab Results  Component Value Date   INSULIN 9.8 05/06/2019   INSULIN 8.8 01/14/2019   INSULIN 9.3 08/30/2018   Lab Results  Component Value Date   TSH 0.84 07/08/2019   Lab Results  Component Value Date   CHOL 223 (H) 07/08/2019   HDL 59.50 07/08/2019   LDLCALC 150 (H) 07/08/2019   LDLDIRECT 147.9 12/17/2012   TRIG 69.0 07/08/2019   CHOLHDL 4 07/08/2019   Lab Results  Component Value Date   WBC 5.0 07/08/2019   HGB 13.2 07/08/2019   HCT 39.6 07/08/2019   MCV 87.5 07/08/2019   PLT 110.0 (L) 07/08/2019   Attestation Statements:   Reviewed by clinician on day of visit: allergies, medications, problem list, medical history, surgical history, family history, social history, and previous encounter notes.  I, Water quality scientist, CMA, am acting as Location manager for [employer], PA-C.  I have reviewed the above documentation for accuracy and completeness, and I agree with the above. Abby Potash, PA-C

## 2019-08-29 MED FILL — metFORMIN HCL 500 MG TABS: 500 | 30 days supply | Qty: 60 | Fill #0

## 2019-09-02 ENCOUNTER — Ambulatory Visit: Payer: 59 | Admitting: Internal Medicine

## 2019-09-02 ENCOUNTER — Other Ambulatory Visit: Payer: Self-pay

## 2019-09-02 ENCOUNTER — Other Ambulatory Visit: Payer: Self-pay | Admitting: Internal Medicine

## 2019-09-02 ENCOUNTER — Encounter: Payer: Self-pay | Admitting: Internal Medicine

## 2019-09-02 DIAGNOSIS — G4733 Obstructive sleep apnea (adult) (pediatric): Secondary | ICD-10-CM

## 2019-09-02 DIAGNOSIS — IMO0001 Reserved for inherently not codable concepts without codable children: Secondary | ICD-10-CM

## 2019-09-02 DIAGNOSIS — J4520 Mild intermittent asthma, uncomplicated: Secondary | ICD-10-CM | POA: Diagnosis not present

## 2019-09-02 MED ORDER — ALBUTEROL SULFATE HFA 108 (90 BASE) MCG/ACT IN AERS: INHALATION_SPRAY | RESPIRATORY_TRACT | 12 refills | Status: DC

## 2019-09-02 MED ORDER — MONTELUKAST SODIUM 10 MG PO TABS: 10.0000 mg | ORAL_TABLET | Freq: Every day | ORAL | 3 refills | Status: DC

## 2019-09-02 MED ORDER — FLUTICASONE-SALMETEROL 250-50 MCG/DOSE IN AEPB: INHALATION_SPRAY | RESPIRATORY_TRACT | 12 refills | Status: DC

## 2019-09-02 MED FILL — ALBUTEROL SULFATE HFA 108 (: 108 (90 BAS | 25 days supply | Qty: 18 | Fill #0

## 2019-09-02 NOTE — Progress Notes (Signed)
Patient ID: Katherine Tanner, female    DOB: 10/27/1960, 59 y.o.   MRN: [id number]  HPI female never smoker followed for Allergic rhinitis, Asthma,OSA, complicated by GERD Office spirometry: 11/24/2014: WNL FVC 2.39/97%, FEV1 2.09/103%, FEV1/FVC 0.88, FEF 25-75 percent 3.27/122% Office Spirometry 07/06/17-WNL-FVC 2.25/91%, FEV1 2.04/104%, ratio 0.91, FEF 25-75% 3.62/173% FENO 10/09/17   11   Not elevated Methacholine inhalation challenge test 01/25/2018-interpreted as positive because of initiation of hard coughing although FEV1 only declined by 15%. HST 10/27/2017-AHI 7.3/hour, desaturation to 88%, body weight 193 pounds ----------------------------------------------------------------------------------  08/14/2018-  59 year old female never smoker followed for Allergic rhinitis, Asthma, OSA, complicated by GERD -----Started Saturday as dry cough. Became productive yesterday. Thick chest congestion. She began increased wheezing 2 weeks ago.  Initially she could manage with her nebulizer treatments but this weekend cough became more pronounced and she has coughed up some white phlegm after previously dry cough.  No purulent sputum or fever. Rib cage gets sore with continued coughing. She had to miss 2 days of work this week.  During exacerbations she usually expects to be out 2 or 3 days at a time.  How often this happens is variable but may be every 3 to 5 months.  09/02/19- 59 year old female never smoker followed for Allergic rhinitis, Asthma, OSA(minimal- no CPAP), complicated by GERD, HTN, Thrombocytopenia, PreDiabetes,  Neb Duoneb, Singulair, Advair 250, Flonase, albuterol hfa,  Epipen for Shellfish allergy ------f/u Asthmatic bronchitis. Body weight today - 175 lbs, down from 200 a few years ago. Advair daily. Used rescue once last week, neb once last month.No recent exacerbation. Triggers include weather, closed rooms, seasonal pollens. In some seasons she has difficulty with long  walks from parking and asks [facility] parking- discussed.  Hx allergic reaction to flu vaccine. Expressed concern of possible similar reaction to Covid vax. Discussed. I recommended she get covid vax. Ok to pre-treat with benadryl.  Review of Systems-Per HPI.   + = positive Constitutional:   No-   weight loss, night sweats, fevers, chills, fatigue, lassitude. HEENT:   +  headaches, difficulty swallowing, tooth/dental problems, sore throat,       No-sneezing, itching, no-ear ache, + nasal congestion, no- post nasal drip,  CV:   chest pain, orthopnea, PND, swelling in lower extremities, anasarca, dizziness, palpitations Resp: No-   shortness of breath with exertion or at rest.             productive cough,  No- non-productive cough,  No- coughing up of blood.              No-   change in color of mucus. No- wheezing.   Skin:  GI:  No-   heartburn, indigestion, abdominal pain, nausea, vomiting,  GU: . MS:  No-   joint pain or swelling.  . Neuro-     nothing unusual Psych:  No- change in mood or affect. No depression or anxiety.  No memory loss.  Objective:   Physical Exam General- Alert, Oriented, Affect-appropriate, Distress- none acute; + Obese Skin- rash-none, lesions- none, excoriation- none Lymphadenopathy- none Head- atraumatic            Eyes- Gross vision intact, PERRLA, conjunctivae clear, no discolored secretions            Ears- Hearing, canals-normal            Nose-    no-Septal dev, mucus, polyps, erosion, perforation             Throat- Mallampati  III , mucosa clear , drainage- none, tonsils- atrophic Neck- flexible , trachea midline, no stridor , thyroid nl, carotid no bruit Chest - symmetrical excursion , unlabored           Heart/CV- RRR , no murmur , no gallop  , no rub, nl s1 s2                           - JVD- none , edema- none, stasis changes- none, varices- none           Lung- unlabored, wheeze- none, cough- none , dullness-none, rub- none,            Chest wall-   Abd-  Br/ Gen/ Rectal- Not done, not indicated Extrem- cyanosis- none, clubbing, none, atrophy- none, strength- nl Neuro- grossly intact to observation

## 2019-09-02 NOTE — Patient Instructions (Signed)
Handicapped parking form  Refills for meds e-sent to [place]  Please call if we can help

## 2019-09-04 ENCOUNTER — Other Ambulatory Visit (INDEPENDENT_AMBULATORY_CARE_PROVIDER_SITE_OTHER): Payer: Self-pay | Admitting: Family Medicine

## 2019-09-04 DIAGNOSIS — E8881 Metabolic syndrome: Secondary | ICD-10-CM

## 2019-09-07 ENCOUNTER — Encounter: Payer: Self-pay | Admitting: Internal Medicine

## 2019-09-07 NOTE — Assessment & Plan Note (Signed)
Treated with weight loss

## 2019-09-07 NOTE — Assessment & Plan Note (Signed)
Currently uncomplicated. Has had significant exacerbations in the past. Plan refill singulair, advair, albuterol. [place] parking.

## 2019-09-09 ENCOUNTER — Other Ambulatory Visit: Payer: 59

## 2019-09-10 ENCOUNTER — Telehealth: Payer: Self-pay | Admitting: Family Medicine

## 2019-09-10 DIAGNOSIS — D696 Thrombocytopenia, unspecified: Secondary | ICD-10-CM

## 2019-09-10 NOTE — Telephone Encounter (Signed)
-----   Message from Ellamae Sia sent at 08/29/2019  2:47 PM EST ----- Regarding: lab orders for Friday, 3.12.21 Lab orders, thanks

## 2019-09-11 ENCOUNTER — Other Ambulatory Visit: Payer: Self-pay

## 2019-09-11 ENCOUNTER — Ambulatory Visit (INDEPENDENT_AMBULATORY_CARE_PROVIDER_SITE_OTHER): Payer: 59 | Admitting: Physician Assistant

## 2019-09-11 ENCOUNTER — Encounter (INDEPENDENT_AMBULATORY_CARE_PROVIDER_SITE_OTHER): Payer: Self-pay | Admitting: Physician Assistant

## 2019-09-11 VITALS — BP 116/70 | HR 70 | Temp 98.2°F | Ht 61.0 in | Wt 174.0 lb

## 2019-09-11 DIAGNOSIS — Z6833 Body mass index (BMI) 33.0-33.9, adult: Secondary | ICD-10-CM

## 2019-09-11 DIAGNOSIS — E7849 Other hyperlipidemia: Secondary | ICD-10-CM | POA: Diagnosis not present

## 2019-09-11 DIAGNOSIS — E669 Obesity, unspecified: Secondary | ICD-10-CM

## 2019-09-11 DIAGNOSIS — R7303 Prediabetes: Secondary | ICD-10-CM | POA: Diagnosis not present

## 2019-09-11 DIAGNOSIS — E559 Vitamin D deficiency, unspecified: Secondary | ICD-10-CM | POA: Diagnosis not present

## 2019-09-11 DIAGNOSIS — R0602 Shortness of breath: Secondary | ICD-10-CM | POA: Diagnosis not present

## 2019-09-11 DIAGNOSIS — Z9189 Other specified personal risk factors, not elsewhere classified: Secondary | ICD-10-CM

## 2019-09-11 NOTE — Progress Notes (Signed)
Chief Complaint:   OBESITY Katherine Tanner is here to discuss her progress with her obesity treatment plan along with follow-up of her obesity related diagnoses. Katherine Tanner is following a lower carbohydrate, vegetable and lean protein rich diet plan. Katherine Tanner states she is walking 20-30 minutes 3 times per week.  Today's visit was #: 23 Starting weight: 198 lbs Starting date: 08/30/2018 Today's weight: 174 lbs Today's date: 09/11/2019 Total lbs lost to date: 24 Total lbs lost since last in-office visit: 3  Interim History: Katherine Tanner did very well on the low carb plan. She would like to continue for 2 more weeks.  Subjective:   Other hyperlipidemia. Katherine Tanner is on no medications. Last total cholesterol 223 on 07/08/2019 with an LDl of 150. No chest pain. She is walking 3 times a week.  Lab Results  Component Value Date   CHOL 223 (H) 07/08/2019   HDL 59.50 07/08/2019   LDLCALC 150 (H) 07/08/2019   LDLDIRECT 147.9 12/17/2012   TRIG 69.0 07/08/2019   CHOLHDL 4 07/08/2019   Lab Results  Component Value Date   ALT 31 07/08/2019   AST 21 07/08/2019   ALKPHOS 115 07/08/2019   BILITOT 0.5 07/08/2019   The 10-year ASCVD risk score Katherine Bussing DC Jr., Katherine al., Katherine Tanner) is: 4.3%   Values used to calculate the score:     Age: 59 years     Sex: Female     Is Non-Hispanic African American: Yes     Diabetic: No     Tobacco smoker: No     Systolic Blood Pressure: 99991111 mmHg     Is BP treated: Yes     HDL Cholesterol: 59.5 mg/dL     Total Cholesterol: 223 mg/dL  SOB (shortness of breath) on exertion. Katherine Tanner notes increasing shortness of breath with exertion and seems to be worsening over time with weight gain. She notes getting out of breath sooner with activity than she used to. No dizziness or lightheadedness.  Vitamin D deficiency. Katherine Tanner is on Vitamin D every other week. Last Vitamin D 52.3 on 05/06/2019. No nausea, vomiting, or muscle weakness.  Prediabetes. Katherine Tanner has a diagnosis of  prediabetes based on her elevated HgA1c and was informed this puts her at greater risk of developing diabetes. She continues to work on diet and exercise to decrease her risk of diabetes. She denies nausea, vomiting, or diarrhea. Katherine Tanner is on metformin. No polyphagia.  She is exercising 3 times a week.  Lab Results  Component Value Date   HGBA1C 5.4 05/06/2019   Lab Results  Component Value Date   INSULIN 9.8 05/06/2019   INSULIN 8.8 01/14/2019   INSULIN 9.3 08/30/2018   At risk for osteoporosis. Katherine Tanner is at higher risk of osteopenia and osteoporosis due to Vitamin D deficiency.   Assessment/Plan:   Other hyperlipidemia. Cardiovascular risk and specific lipid/LDL goals reviewed.  We discussed several lifestyle modifications today and Elenamarie will continue to work on diet, exercise and weight loss efforts. Orders and follow up as documented in patient record. Mihika will continue her medications as directed. Hemoglobin A1c ordered.  Counseling Intensive lifestyle modifications are the first line treatment for this issue. . Dietary changes: Increase soluble fiber. Decrease simple carbohydrates. . Exercise changes: Moderate to vigorous-intensity aerobic activity 150 minutes per week if tolerated. . Lipid-lowering medications: see documented in medical record.    SOB (shortness of breath) on exertion. Alizza's shortness of breath appears to be obesity related and exercise induced. She has agreed  to work on weight loss and gradually increase exercise to treat her exercise induced shortness of breath. Will continue to monitor closely. IC was checked.  Vitamin D deficiency. Low Vitamin D level contributes to fatigue and are associated with obesity, breast, and colon cancer. She agrees to continue to take Vitamin D and VITAMIN D 25 Hydroxy (Vit-D Deficiency, Fractures) level ordered.  Prediabetes. Sumar will continue to work on weight loss, exercise, and decreasing simple carbohydrates to help  decrease the risk of diabetes. She will continue metformin as directed.  A1c, Insulin, random labs ordered.  At risk for osteoporosis. Katherine Tanner was given approximately 15 minutes of osteoporosis prevention counseling today. Katherine Tanner is at risk for osteopenia and osteoporosis due to her Vitamin D deficiency. She was encouraged to take her Vitamin D and follow her higher calcium diet and increase strengthening exercise to help strengthen her bones and decrease her risk of osteopenia and osteoporosis.  Katherine spaced learning was employed today to elicit superior memory formation and behavioral change.  Class 1 obesity with serious comorbidity and body mass index (BMI) of 33.0 to 33.9 in adult, unspecified obesity type.  Katherine Tanner is currently in the action stage of change. As such, her goal is to continue with weight loss efforts. She has agreed to following a lower carbohydrate, vegetable and lean protein rich diet plan.   Exercise goals: For substantial health benefits, adults should do at least 150 minutes (2 hours and 30 minutes) a week of moderate-intensity, or 75 minutes (1 hour and 15 minutes) a week of vigorous-intensity aerobic physical activity, or an equivalent combination of moderate- and vigorous-intensity aerobic activity. Aerobic activity should be performed in episodes of at least 10 minutes, and preferably, it should be spread throughout the week.  Behavioral modification strategies: meal planning and cooking strategies and planning for success.  Katherine Tanner has agreed to follow-up with our clinic in 2 weeks. She was informed of the importance of frequent follow-up visits to maximize her success with intensive lifestyle modifications for her multiple health conditions.   Katherine Tanner was informed we would discuss her lab results at her next visit unless there is a critical issue that needs to be addressed sooner. Katherine Tanner agreed to keep her next visit at the agreed upon time to discuss these  results.  Objective:   Blood pressure 116/70, pulse 70, temperature 98.2 F (36.8 C), temperature source Oral, height 5\' 1"  (1.549 m), weight 174 lb (78.9 kg), last menstrual period 02/20/2014, SpO2 100 %. Body mass index is 32.88 kg/m.  General: Cooperative, alert, well developed, in no acute distress. HEENT: Conjunctivae and lids unremarkable. Cardiovascular: Regular rhythm.  Lungs: Normal work of breathing. Neurologic: No focal deficits.   Lab Results  Component Value Date   CREATININE 0.64 07/08/2019   BUN 16 07/08/2019   NA 141 07/08/2019   K 4.0 07/08/2019   CL 101 07/08/2019   CO2 32 07/08/2019   Lab Results  Component Value Date   ALT 31 07/08/2019   AST 21 07/08/2019   ALKPHOS 115 07/08/2019   BILITOT 0.5 07/08/2019   Lab Results  Component Value Date   HGBA1C 5.4 05/06/2019   HGBA1C 5.9 (H) 01/14/2019   HGBA1C 5.9 (H) 08/30/2018   HGBA1C 6.1 06/08/2017   HGBA1C 6.1 06/15/2016   Lab Results  Component Value Date   INSULIN 9.8 05/06/2019   INSULIN 8.8 01/14/2019   INSULIN 9.3 08/30/2018   Lab Results  Component Value Date   TSH 0.84 07/08/2019  Lab Results  Component Value Date   CHOL 223 (H) 07/08/2019   HDL 59.50 07/08/2019   LDLCALC 150 (H) 07/08/2019   LDLDIRECT 147.9 12/17/2012   TRIG 69.0 07/08/2019   CHOLHDL 4 07/08/2019   Lab Results  Component Value Date   WBC 5.0 07/08/2019   HGB 13.2 07/08/2019   HCT 39.6 07/08/2019   MCV 87.5 07/08/2019   PLT 110.0 (L) 07/08/2019   No results found for: IRON, TIBC, FERRITIN  Attestation Statements:   Reviewed by clinician on day of visit: allergies, medications, problem list, medical history, surgical history, family history, social history, and previous encounter notes.  IMichaelene Song, am acting as transcriptionist for Abby Potash, PA-C   I have reviewed the above documentation for accuracy and completeness, and I agree with the above. Abby Potash, PA-C

## 2019-09-12 LAB — HEMOGLOBIN A1C
Est. average glucose Bld gHb Est-mCnc: 111 mg/dL
Hgb A1c MFr Bld: 5.5 % (ref 4.8–5.6)

## 2019-09-12 LAB — VITAMIN D 25 HYDROXY (VIT D DEFICIENCY, FRACTURES): Vit D, 25-Hydroxy: 62 ng/mL (ref 30.0–100.0)

## 2019-09-12 LAB — INSULIN, RANDOM: INSULIN: 4 u[IU]/mL (ref 2.6–24.9)

## 2019-09-13 ENCOUNTER — Other Ambulatory Visit (INDEPENDENT_AMBULATORY_CARE_PROVIDER_SITE_OTHER): Payer: 59

## 2019-09-13 ENCOUNTER — Other Ambulatory Visit: Payer: Self-pay

## 2019-09-13 DIAGNOSIS — D696 Thrombocytopenia, unspecified: Secondary | ICD-10-CM

## 2019-09-13 DIAGNOSIS — E7800 Pure hypercholesterolemia, unspecified: Secondary | ICD-10-CM

## 2019-09-13 LAB — LIPID PANEL
Cholesterol: 182 mg/dL (ref 0–200)
HDL: 56.3 mg/dL (ref >39.00)
LDL Cholesterol: 115 mg/dL — ABNORMAL HIGH (ref 0–99)
NonHDL: 125.37
Total CHOL/HDL Ratio: 3
Triglycerides: 51 mg/dL (ref 0.0–149.0)
VLDL: 10.2 mg/dL (ref 0.0–40.0)

## 2019-09-13 NOTE — Addendum Note (Signed)
Addended by: Ellamae Sia on: 09/13/2019 07:54 AM   Modules accepted: Orders

## 2019-09-16 DIAGNOSIS — H52223 Regular astigmatism, bilateral: Secondary | ICD-10-CM | POA: Diagnosis not present

## 2019-09-16 DIAGNOSIS — H04123 Dry eye syndrome of bilateral lacrimal glands: Secondary | ICD-10-CM | POA: Diagnosis not present

## 2019-09-16 DIAGNOSIS — H5203 Hypermetropia, bilateral: Secondary | ICD-10-CM | POA: Diagnosis not present

## 2019-09-16 DIAGNOSIS — Z83511 Family history of glaucoma: Secondary | ICD-10-CM | POA: Diagnosis not present

## 2019-09-16 DIAGNOSIS — H524 Presbyopia: Secondary | ICD-10-CM | POA: Diagnosis not present

## 2019-09-16 LAB — CBC WITH DIFFERENTIAL/PLATELET
Absolute Monocytes: 526 cells/uL (ref 200–950)
Basophils Absolute: 61 cells/uL (ref 0–200)
Basophils Relative: 1.3 %
Eosinophils Absolute: 99 cells/uL (ref 15–500)
Eosinophils Relative: 2.1 %
HCT: 39.8 % (ref 35.0–45.0)
Hemoglobin: 13.1 g/dL (ref 11.7–15.5)
Lymphs Abs: 1998 cells/uL (ref 850–3900)
MCH: 29.1 pg (ref 27.0–33.0)
MCHC: 32.9 g/dL (ref 32.0–36.0)
MCV: 88.4 fL (ref 80.0–100.0)
MPV: 13.2 fL — ABNORMAL HIGH (ref 7.5–12.5)
Monocytes Relative: 11.2 %
Neutro Abs: 2016 cells/uL (ref 1500–7800)
Neutrophils Relative %: 42.9 %
Platelets: 134 10*3/uL — ABNORMAL LOW (ref 140–400)
RBC: 4.5 10*6/uL (ref 3.80–5.10)
RDW: 12.9 % (ref 11.0–15.0)
Total Lymphocyte: 42.5 %
WBC: 4.7 10*3/uL (ref 3.8–10.8)

## 2019-09-16 LAB — PATHOLOGIST SMEAR REVIEW

## 2019-09-17 DIAGNOSIS — Z96652 Presence of left artificial knee joint: Secondary | ICD-10-CM | POA: Diagnosis not present

## 2019-09-17 DIAGNOSIS — M25561 Pain in right knee: Secondary | ICD-10-CM | POA: Diagnosis not present

## 2019-09-17 DIAGNOSIS — M25562 Pain in left knee: Secondary | ICD-10-CM | POA: Diagnosis not present

## 2019-09-17 DIAGNOSIS — Z96651 Presence of right artificial knee joint: Secondary | ICD-10-CM | POA: Diagnosis not present

## 2019-09-17 MED FILL — LOSARTAN-HCTZ 100-25 MG TAB: 100-25 | 30 days supply | Qty: 30 | Fill #2

## 2019-09-17 MED FILL — CELECOXIB 200 MG CAP: 200 | 30 days supply | Qty: 60 | Fill #0

## 2019-09-25 MED FILL — ADVAIR 250/50 DISKUS: 250-50 | 60 days supply | Qty: 60 | Fill #2

## 2019-09-26 MED FILL — traMADol HCL 50 MG TABS: 50 | 20 days supply | Qty: 40 | Fill #0

## 2019-09-30 ENCOUNTER — Ambulatory Visit (INDEPENDENT_AMBULATORY_CARE_PROVIDER_SITE_OTHER): Payer: 59 | Admitting: Physician Assistant

## 2019-09-30 ENCOUNTER — Other Ambulatory Visit: Payer: Self-pay

## 2019-09-30 ENCOUNTER — Encounter (INDEPENDENT_AMBULATORY_CARE_PROVIDER_SITE_OTHER): Payer: Self-pay | Admitting: Physician Assistant

## 2019-09-30 VITALS — BP 121/70 | HR 66 | Temp 97.8°F | Ht 61.0 in | Wt 172.0 lb

## 2019-09-30 DIAGNOSIS — Z9189 Other specified personal risk factors, not elsewhere classified: Secondary | ICD-10-CM

## 2019-09-30 DIAGNOSIS — Z6832 Body mass index (BMI) 32.0-32.9, adult: Secondary | ICD-10-CM

## 2019-09-30 DIAGNOSIS — R7303 Prediabetes: Secondary | ICD-10-CM | POA: Diagnosis not present

## 2019-09-30 DIAGNOSIS — E559 Vitamin D deficiency, unspecified: Secondary | ICD-10-CM

## 2019-09-30 DIAGNOSIS — E669 Obesity, unspecified: Secondary | ICD-10-CM | POA: Diagnosis not present

## 2019-09-30 MED ORDER — METFORMIN HCL 500 MG PO TABS: 500.0000 mg | ORAL_TABLET | Freq: Two times a day (BID) | ORAL | 0 refills | Status: DC

## 2019-09-30 MED FILL — METFORMIN HCL 500 MG TABS: 500 | 30 days supply | Qty: 60 | Fill #0

## 2019-09-30 MED FILL — PEG-3350 SOLUTION: 420 | 2 days supply | Qty: 4000 | Fill #0

## 2019-09-30 NOTE — Progress Notes (Signed)
Chief Complaint:   OBESITY Katherine Tanner is here to discuss her progress with her obesity treatment plan along with follow-up of her obesity related diagnoses. Katherine Tanner is following a lower carbohydrate, vegetable and lean protein rich diet plan and states she is following her eating plan approximately 100% of the time. Katherine Tanner states she is walking 20 minutes 2-4 times per week.  Today's visit was #: 24 Starting weight: 198 lbs Starting date: 08/30/2018 Today's weight: 172 lbs Today's date: 09/30/2019 Total lbs lost to date: 26 Total lbs lost since last in-office visit: 2  Interim History: Katherine Tanner is enjoying the low carb plan and finds it easy to follow. She is now working 8 hour days instead of 10 hour days.  Subjective:   Prediabetes. Katherine Tanner has a diagnosis of prediabetes based on her elevated HgA1c and was informed this puts her at greater risk of developing diabetes. She continues to work on diet and exercise to decrease her risk of diabetes. She denies nausea, vomiting, or diarrhea. No polyphagia. Katherine Tanner is on metformin.   Lab Results  Component Value Date   HGBA1C 5.5 09/11/2019   Lab Results  Component Value Date   INSULIN 4.0 09/11/2019   INSULIN 9.8 05/06/2019   INSULIN 8.8 01/14/2019   INSULIN 9.3 08/30/2018   Vitamin D deficiency. Katherine Tanner is on Vitamin D 50,000 units every other week. No nausea, vomiting, or muscle weakness. Last Vitamin D 62.0 on 09/11/2019.  At risk for diabetes mellitus. Katherine Tanner is at higher than average risk for developing diabetes due to her obesity.   Assessment/Plan:   Prediabetes. Katherine Tanner will continue to work on weight loss, exercise, and decreasing simple carbohydrates to help decrease the risk of diabetes. She was given a refill on her metFORMIN (GLUCOPHAGE) 500 MG tablet #60 with 0 refills.  Vitamin D deficiency. Low Vitamin D level contributes to fatigue and are associated with obesity, breast, and colon cancer. She will  change to taking Vitamin D 2,000 units daily (OTC) and will discontinue prescription Vitamin D. She will follow-up for routine testing of Vitamin D, at least 2-3 times per year to avoid over-replacement.  At risk for diabetes mellitus. Katherine Tanner was given approximately 15 minutes of diabetes education and counseling today. We discussed intensive lifestyle modifications today with an emphasis on weight loss as well as increasing exercise and decreasing simple carbohydrates in her diet. We also reviewed medication options with an emphasis on risk versus benefit of those discussed.   Repetitive spaced learning was employed today to elicit superior memory formation and behavioral change.  Class 1 obesity with serious comorbidity and body mass index (BMI) of 32.0 to 32.9 in adult, unspecified obesity type.  Katherine Tanner is currently in the action stage of change. As such, her goal is to continue with weight loss efforts. She has agreed to following a lower carbohydrate, vegetable and lean protein rich diet plan.   Exercise goals: For substantial health benefits, adults should do at least 150 minutes (2 hours and 30 minutes) a week of moderate-intensity, or 75 minutes (1 hour and 15 minutes) a week of vigorous-intensity aerobic physical activity, or an equivalent combination of moderate- and vigorous-intensity aerobic activity. Aerobic activity should be performed in episodes of at least 10 minutes, and preferably, it should be spread throughout the week.  Behavioral modification strategies: meal planning and cooking strategies and keeping healthy foods in the home.  Katherine Tanner has agreed to follow-up with our clinic in 3 weeks. She was informed of  the importance of frequent follow-up visits to maximize her success with intensive lifestyle modifications for her multiple health conditions.   Objective:   Blood pressure 121/70, pulse 66, temperature 97.8 F (36.6 C), height 5\' 1"  (1.549 m), weight 172 lb (78 kg), last  menstrual period 02/20/2014, SpO2 100 %. Body mass index is 32.5 kg/m.  General: Cooperative, alert, well developed, in no acute distress. HEENT: Conjunctivae and lids unremarkable. Cardiovascular: Regular rhythm.  Lungs: Normal work of breathing. Neurologic: No focal deficits.   Lab Results  Component Value Date   CREATININE 0.64 07/08/2019   BUN 16 07/08/2019   NA 141 07/08/2019   K 4.0 07/08/2019   CL 101 07/08/2019   CO2 32 07/08/2019   Lab Results  Component Value Date   ALT 31 07/08/2019   AST 21 07/08/2019   ALKPHOS 115 07/08/2019   BILITOT 0.5 07/08/2019   Lab Results  Component Value Date   HGBA1C 5.5 09/11/2019   HGBA1C 5.4 05/06/2019   HGBA1C 5.9 (H) 01/14/2019   HGBA1C 5.9 (H) 08/30/2018   HGBA1C 6.1 06/08/2017   Lab Results  Component Value Date   INSULIN 4.0 09/11/2019   INSULIN 9.8 05/06/2019   INSULIN 8.8 01/14/2019   INSULIN 9.3 08/30/2018   Lab Results  Component Value Date   TSH 0.84 07/08/2019   Lab Results  Component Value Date   CHOL 182 09/13/2019   HDL 56.30 09/13/2019   LDLCALC 115 (H) 09/13/2019   LDLDIRECT 147.9 12/17/2012   TRIG 51.0 09/13/2019   CHOLHDL 3 09/13/2019   Lab Results  Component Value Date   WBC 4.7 09/13/2019   HGB 13.1 09/13/2019   HCT 39.8 09/13/2019   MCV 88.4 09/13/2019   PLT 134 (L) 09/13/2019   No results found for: IRON, TIBC, FERRITIN  Attestation Statements:   Reviewed by clinician on day of visit: allergies, medications, problem list, medical history, surgical history, family history, social history, and previous encounter notes.  IMichaelene Song, am acting as transcriptionist for Abby Potash, PA-C   I have reviewed the above documentation for accuracy and completeness, and I agree with the above. Abby Potash, PA-C

## 2019-10-09 DIAGNOSIS — Z1159 Encounter for screening for other viral diseases: Secondary | ICD-10-CM | POA: Diagnosis not present

## 2019-10-14 DIAGNOSIS — K635 Polyp of colon: Secondary | ICD-10-CM | POA: Diagnosis not present

## 2019-10-14 DIAGNOSIS — Z8371 Family history of colonic polyps: Secondary | ICD-10-CM | POA: Diagnosis not present

## 2019-10-14 DIAGNOSIS — Z86010 Personal history of colonic polyps: Secondary | ICD-10-CM | POA: Diagnosis not present

## 2019-10-14 DIAGNOSIS — K621 Rectal polyp: Secondary | ICD-10-CM | POA: Diagnosis not present

## 2019-10-15 ENCOUNTER — Encounter (INDEPENDENT_AMBULATORY_CARE_PROVIDER_SITE_OTHER): Payer: Self-pay | Admitting: Physician Assistant

## 2019-10-16 NOTE — Telephone Encounter (Signed)
Please advise 

## 2019-10-20 MED FILL — LOSARTAN-HCTZ 100-25 MG TAB: 100-25 | 30 days supply | Qty: 30 | Fill #3

## 2019-10-21 ENCOUNTER — Ambulatory Visit (INDEPENDENT_AMBULATORY_CARE_PROVIDER_SITE_OTHER): Payer: 59 | Admitting: Physician Assistant

## 2019-10-21 ENCOUNTER — Other Ambulatory Visit: Payer: Self-pay

## 2019-10-21 VITALS — BP 139/72 | HR 70 | Temp 98.1°F | Ht 61.0 in | Wt 171.0 lb

## 2019-10-21 DIAGNOSIS — E7849 Other hyperlipidemia: Secondary | ICD-10-CM

## 2019-10-21 DIAGNOSIS — Z6832 Body mass index (BMI) 32.0-32.9, adult: Secondary | ICD-10-CM | POA: Diagnosis not present

## 2019-10-21 DIAGNOSIS — E669 Obesity, unspecified: Secondary | ICD-10-CM

## 2019-10-21 MED FILL — MONTELUKAST SOD 10 MG TAB: 10 | 90 days supply | Qty: 90 | Fill #2

## 2019-10-22 MED FILL — traMADol HCL 50 MG TABS: 50 | 20 days supply | Qty: 40 | Fill #0

## 2019-10-22 NOTE — Progress Notes (Signed)
Chief Complaint:   OBESITY Katherine Tanner is here to discuss her progress with her obesity treatment plan along with follow-up of her obesity related diagnoses. Katherine Tanner is following a lower carbohydrate, vegetable and lean protein rich diet plan and states she is following her eating plan approximately 95% of the time. Katherine Tanner states she is walking 20-30 minutes 3 times per week.  Today's visit was #: 25 Starting weight: 198 Starting date: 08/30/2018 Today's weight: 171 lbs Today's date: 10/21/2019 Total lbs lost to date: 27 Total lbs lost since last in-office visit: 1  Interim History: Katherine Tanner states that she had a colonoscopy and was off plan for a couple of days. She has been craving sweets.  Subjective:   Other hyperlipidemia. Katherine Tanner is on no medications. No chest pain.   Lab Results  Component Value Date   CHOL 182 09/13/2019   HDL 56.30 09/13/2019   LDLCALC 115 (H) 09/13/2019   LDLDIRECT 147.9 12/17/2012   TRIG 51.0 09/13/2019   CHOLHDL 3 09/13/2019   Lab Results  Component Value Date   ALT 31 07/08/2019   AST 21 07/08/2019   ALKPHOS 115 07/08/2019   BILITOT 0.5 07/08/2019   The 10-year ASCVD risk score [name], et al., 2013) is: 6.6%   Values used to calculate the score:     Age: 59 years     Sex: Female     Is Non-Hispanic African American: Yes     Diabetic: No     Tobacco smoker: No     Systolic Blood Pressure: [id number] mmHg     Is BP treated: Yes     HDL Cholesterol: 56.3 mg/dL     Total Cholesterol: 182 mg/dL  Assessment/Plan:   Other hyperlipidemia. Cardiovascular risk and specific lipid/LDL goals reviewed.  We discussed several lifestyle modifications today and Katherine Tanner will continue to work on diet, exercise and weight loss efforts. Orders and follow up as documented in patient record.   Counseling Intensive lifestyle modifications are the first line treatment for this issue. . Dietary changes: Increase soluble fiber. Decrease simple  carbohydrates. . Exercise changes: Moderate to vigorous-intensity aerobic activity 150 minutes per week if tolerated. . Lipid-lowering medications: see documented in medical record.  Class 1 obesity with serious comorbidity and body mass index (BMI) of 32.0 to 32.9 in adult, unspecified obesity type.  Katherine Tanner is currently in the action stage of change. As such, her goal is to continue with weight loss efforts. She has agreed to following a lower carbohydrate, vegetable and lean protein rich diet plan.   Exercise goals: For substantial health benefits, adults should do at least 150 minutes (2 hours and 30 minutes) a week of moderate-intensity, or 75 minutes (1 hour and 15 minutes) a week of vigorous-intensity aerobic physical activity, or an equivalent combination of moderate- and vigorous-intensity aerobic activity. Aerobic activity should be performed in episodes of at least 10 minutes, and preferably, it should be spread throughout the week.  Behavioral modification strategies: meal planning and cooking strategies and keeping healthy foods in the home.  Katherine Tanner has agreed to follow-up with our clinic in 3 weeks. She was informed of the importance of frequent follow-up visits to maximize her success with intensive lifestyle modifications for her multiple health conditions.   Objective:   Blood pressure 139/72, pulse 70, temperature 98.1 F (36.7 C), temperature source Oral, height 5\' 1"  (1.549 m), weight 171 lb (77.6 kg), last menstrual period 02/20/2014, SpO2 99 %. Body mass index is  32.31 kg/m.  General: Cooperative, alert, well developed, in no acute distress. HEENT: Conjunctivae and lids unremarkable. Cardiovascular: Regular rhythm.  Lungs: Normal work of breathing. Neurologic: No focal deficits.   Lab Results  Component Value Date   CREATININE 0.64 07/08/2019   BUN 16 07/08/2019   NA 141 07/08/2019   K 4.0 07/08/2019   CL 101 07/08/2019   CO2 32 07/08/2019   Lab Results    Component Value Date   ALT 31 07/08/2019   AST 21 07/08/2019   ALKPHOS 115 07/08/2019   BILITOT 0.5 07/08/2019   Lab Results  Component Value Date   HGBA1C 5.5 09/11/2019   HGBA1C 5.4 05/06/2019   HGBA1C 5.9 (H) 01/14/2019   HGBA1C 5.9 (H) 08/30/2018   HGBA1C 6.1 06/08/2017   Lab Results  Component Value Date   INSULIN 4.0 09/11/2019   INSULIN 9.8 05/06/2019   INSULIN 8.8 01/14/2019   INSULIN 9.3 08/30/2018   Lab Results  Component Value Date   TSH 0.84 07/08/2019   Lab Results  Component Value Date   CHOL 182 09/13/2019   HDL 56.30 09/13/2019   LDLCALC 115 (H) 09/13/2019   LDLDIRECT 147.9 12/17/2012   TRIG 51.0 09/13/2019   CHOLHDL 3 09/13/2019   Lab Results  Component Value Date   WBC 4.7 09/13/2019   HGB 13.1 09/13/2019   HCT 39.8 09/13/2019   MCV 88.4 09/13/2019   PLT 134 (L) 09/13/2019   No results found for: IRON, TIBC, FERRITIN  Attestation Statements:   Reviewed by clinician on day of visit: allergies, medications, problem list, medical history, surgical history, family history, social history, and previous encounter notes.  Time spent on visit including pre-visit chart review and post-visit charting and care was 25 minutes.   IMichaelene Song, am acting as transcriptionist for Abby Potash, PA-C   I have reviewed the above documentation for accuracy and completeness, and I agree with the above. Abby Potash, PA-C

## 2019-10-24 MED FILL — MELOXICAM 15 MG TABLET: 15 | 30 days supply | Qty: 30 | Fill #1

## 2019-11-14 ENCOUNTER — Other Ambulatory Visit: Payer: Self-pay

## 2019-11-14 ENCOUNTER — Encounter (INDEPENDENT_AMBULATORY_CARE_PROVIDER_SITE_OTHER): Payer: Self-pay | Admitting: Physician Assistant

## 2019-11-14 ENCOUNTER — Ambulatory Visit (INDEPENDENT_AMBULATORY_CARE_PROVIDER_SITE_OTHER): Payer: 59 | Admitting: Physician Assistant

## 2019-11-14 VITALS — BP 123/71 | HR 68 | Temp 98.5°F | Ht 61.0 in | Wt 173.0 lb

## 2019-11-14 DIAGNOSIS — Z6832 Body mass index (BMI) 32.0-32.9, adult: Secondary | ICD-10-CM

## 2019-11-14 DIAGNOSIS — R7303 Prediabetes: Secondary | ICD-10-CM | POA: Diagnosis not present

## 2019-11-14 DIAGNOSIS — E669 Obesity, unspecified: Secondary | ICD-10-CM

## 2019-11-14 DIAGNOSIS — Z9189 Other specified personal risk factors, not elsewhere classified: Secondary | ICD-10-CM | POA: Diagnosis not present

## 2019-11-14 DIAGNOSIS — E66811 Obesity, class 1: Secondary | ICD-10-CM

## 2019-11-14 DIAGNOSIS — I10 Essential (primary) hypertension: Secondary | ICD-10-CM

## 2019-11-14 MED ORDER — METFORMIN HCL 500 MG PO TABS: 500.0000 mg | ORAL_TABLET | Freq: Two times a day (BID) | ORAL | 0 refills | Status: DC

## 2019-11-14 NOTE — Progress Notes (Signed)
Chief Complaint:   OBESITY Katherine Tanner is here to discuss her progress with her obesity treatment plan along with follow-up of her obesity related diagnoses. Katherine Tanner is following a lower carbohydrate, vegetable and lean protein rich diet plan and states she is following her eating plan approximately 90% of the time. Katherine Tanner states she is walking and doing workout videos 30 minutes 5 times per week.  Today's visit was #: 74 Starting weight: 198 lbs Starting date: 08/30/2018 Today's weight: 173 lbs Today's date: 11/14/2019 Total lbs lost to date: 25 Total lbs lost since last in-office visit: 0  Interim History: Katherine Tanner is frustrated with the lack of weight loss. She sometimes skips lunch due to a busy schedule at work. She is craving sweets. She is stressed and not getting a full night's sleep.  Subjective:   Prediabetes. [name] has a diagnosis of prediabetes based on her elevated HgA1c and was informed this puts her at greater risk of developing diabetes. She continues to work on diet and exercise to decrease her risk of diabetes. She denies nausea or hypoglycemia. Katherine Tanner is on metformin and is only taking it once daily currently, but reports some cravings. No nausea, vomiting, or diarrhea. She is exercising regularly.  Lab Results  Component Value Date   HGBA1C 5.5 09/11/2019   Lab Results  Component Value Date   INSULIN 4.0 09/11/2019   INSULIN 9.8 05/06/2019   INSULIN 8.8 01/14/2019   INSULIN 9.3 08/30/2018   Essential hypertension. [name] is on losartan-HCTZ. No chest pain or headache. Blood pressure is controlled today. She is exercising regularly.  BP Readings from Last 3 Encounters:  11/14/19 123/71  10/21/19 139/72  09/30/19 121/70   Lab Results  Component Value Date   CREATININE 0.64 07/08/2019   CREATININE 0.48 (L) 05/06/2019   CREATININE 0.52 (L) 01/14/2019   At risk for diabetes mellitus. Katherine Tanner is at higher than average risk for developing  diabetes due to her obesity.   Assessment/Plan:   Prediabetes. Katherine Tanner will continue to work on weight loss, exercise, and decreasing simple carbohydrates to help decrease the risk of diabetes. Refill was given  for metFORMIN (GLUCOPHAGE) 500 MG tablet #60 with 0 refills.  Essential hypertension. Katherine Tanner is working on healthy weight loss and exercise to improve blood pressure control. We will watch for signs of hypotension as she continues her lifestyle modifications. She will continue her medication as directed.  At risk for diabetes mellitus. Katherine Tanner was given approximately 15 minutes of diabetes education and counseling today. We discussed intensive lifestyle modifications today with an emphasis on weight loss as well as increasing exercise and decreasing simple carbohydrates in her diet. We also reviewed medication options with an emphasis on risk versus benefit of those discussed.   Repetitive spaced learning was employed today to elicit superior memory formation and behavioral change.  Class 1 obesity with serious comorbidity and body mass index (BMI) of 32.0 to 32.9 in adult, unspecified obesity type.  [name] is currently in the action stage of change. As such, her goal is to continue with weight loss efforts. She has agreed to change plans and will now follow the Category 2 Plan.   Exercise goals: For substantial health benefits, adults should do at least 150 minutes (2 hours and 30 minutes) a week of moderate-intensity, or 75 minutes (1 hour and 15 minutes) a week of vigorous-intensity aerobic physical activity, or an equivalent combination of moderate- and vigorous-intensity aerobic activity. Aerobic activity should be performed in episodes  of at least 10 minutes, and preferably, it should be spread throughout the week.  Behavioral modification strategies: meal planning and cooking strategies and keeping healthy foods in the home.  Katherine Tanner has agreed to follow-up with our clinic in 2-3  weeks. She was informed of the importance of frequent follow-up visits to maximize her success with intensive lifestyle modifications for her multiple health conditions.   Objective:   Blood pressure 123/71, pulse 68, temperature 98.5 F (36.9 C), temperature source Oral, height 5\' 1"  (1.549 m), weight 173 lb (78.5 kg), last menstrual period 02/20/2014, SpO2 99 %. Body mass index is 32.69 kg/m.  General: Cooperative, alert, well developed, in no acute distress. HEENT: Conjunctivae and lids unremarkable. Cardiovascular: Regular rhythm.  Lungs: Normal work of breathing. Neurologic: No focal deficits.   Lab Results  Component Value Date   CREATININE 0.64 07/08/2019   BUN 16 07/08/2019   NA 141 07/08/2019   K 4.0 07/08/2019   CL 101 07/08/2019   CO2 32 07/08/2019   Lab Results  Component Value Date   ALT 31 07/08/2019   AST 21 07/08/2019   ALKPHOS 115 07/08/2019   BILITOT 0.5 07/08/2019   Lab Results  Component Value Date   HGBA1C 5.5 09/11/2019   HGBA1C 5.4 05/06/2019   HGBA1C 5.9 (H) 01/14/2019   HGBA1C 5.9 (H) 08/30/2018   HGBA1C 6.1 06/08/2017   Lab Results  Component Value Date   INSULIN 4.0 09/11/2019   INSULIN 9.8 05/06/2019   INSULIN 8.8 01/14/2019   INSULIN 9.3 08/30/2018   Lab Results  Component Value Date   TSH 0.84 07/08/2019   Lab Results  Component Value Date   CHOL 182 09/13/2019   HDL 56.30 09/13/2019   LDLCALC 115 (H) 09/13/2019   LDLDIRECT 147.9 12/17/2012   TRIG 51.0 09/13/2019   CHOLHDL 3 09/13/2019   Lab Results  Component Value Date   WBC 4.7 09/13/2019   HGB 13.1 09/13/2019   HCT 39.8 09/13/2019   MCV 88.4 09/13/2019   PLT 134 (L) 09/13/2019   No results found for: IRON, TIBC, FERRITIN  Attestation Statements:   Reviewed by clinician on day of visit: allergies, medications, problem list, medical history, surgical history, family history, social history, and previous encounter notes.  IMichaelene Song, am acting as  transcriptionist for Abby Potash, PA-C   I have reviewed the above documentation for accuracy and completeness, and I agree with the above. Abby Potash, PA-C

## 2019-11-20 MED FILL — MELOXICAM 15 MG TABLET: 15 | 30 days supply | Qty: 30 | Fill #2

## 2019-11-20 MED FILL — ADVAIR 250/50 DISKUS: 250-50 | 30 days supply | Qty: 60 | Fill #0

## 2019-11-20 MED FILL — LOSARTAN-HCTZ 100-25 MG TAB: 100-25 | 30 days supply | Qty: 30 | Fill #4

## 2019-12-03 ENCOUNTER — Ambulatory Visit (INDEPENDENT_AMBULATORY_CARE_PROVIDER_SITE_OTHER): Payer: 59 | Admitting: Physician Assistant

## 2019-12-03 ENCOUNTER — Other Ambulatory Visit: Payer: Self-pay

## 2019-12-03 ENCOUNTER — Encounter (INDEPENDENT_AMBULATORY_CARE_PROVIDER_SITE_OTHER): Payer: Self-pay | Admitting: Physician Assistant

## 2019-12-03 VITALS — BP 116/65 | HR 67 | Temp 98.4°F | Ht 61.0 in | Wt 170.0 lb

## 2019-12-03 DIAGNOSIS — Z6832 Body mass index (BMI) 32.0-32.9, adult: Secondary | ICD-10-CM

## 2019-12-03 DIAGNOSIS — R7303 Prediabetes: Secondary | ICD-10-CM

## 2019-12-03 DIAGNOSIS — E669 Obesity, unspecified: Secondary | ICD-10-CM | POA: Diagnosis not present

## 2019-12-03 NOTE — Progress Notes (Signed)
Chief Complaint:   OBESITY Katherine Tanner is here to discuss her progress with her obesity treatment plan along with follow-up of her obesity related diagnoses. Katherine Tanner is on the Category 2 Plan and states she is following her eating plan approximately 100% of the time. Katherine Tanner states she is walking 30 minutes 5 times per week.  Today's visit was #: 57 Starting weight: 198 lbs Starting date: 08/30/2018 Today's weight: 170 lbs Today's date: 12/03/2019 Total lbs lost to date: 28 Total lbs lost since last in-office visit: 3  Interim History: Katherine Tanner states that she was happy to get back to the Category 2 meal plan and did very well. She is traveling to the beach for a week.  Subjective:   Prediabetes. Katherine Tanner has a diagnosis of prediabetes based on her elevated HgA1c and was informed this puts her at greater risk of developing diabetes. She continues to work on diet and exercise to decrease her risk of diabetes. She denies nausea, vomiting, diarrhea, or polyphagia. Katherine Tanner is on metformin.  Lab Results  Component Value Date   HGBA1C 5.5 09/11/2019   Lab Results  Component Value Date   INSULIN 4.0 09/11/2019   INSULIN 9.8 05/06/2019   INSULIN 8.8 01/14/2019   INSULIN 9.3 08/30/2018   Assessment/Plan:   Prediabetes. Katherine Tanner will continue to work on weight loss, exercise, and decreasing simple carbohydrates to help decrease the risk of diabetes. She will continue metformin as directed.  Class 1 obesity with serious comorbidity and body mass index (BMI) of 32.0 to 32.9 in adult, unspecified obesity type.  Katherine Tanner is currently in the action stage of change. As such, her goal is to continue with weight loss efforts. She has agreed to the Category 2 Plan.   Exercise goals: For substantial health benefits, adults should do at least 150 minutes (2 hours and 30 minutes) a week of moderate-intensity, or 75 minutes (1 hour and 15 minutes) a week of vigorous-intensity aerobic physical  activity, or an equivalent combination of moderate- and vigorous-intensity aerobic activity. Aerobic activity should be performed in episodes of at least 10 minutes, and preferably, it should be spread throughout the week.  Behavioral modification strategies: meal planning and cooking strategies and keeping healthy foods in the home.  Katherine Tanner has agreed to follow-up with our clinic in 3 weeks. She was informed of the importance of frequent follow-up visits to maximize her success with intensive lifestyle modifications for her multiple health conditions.   Objective:   Blood pressure 116/65, pulse 67, temperature 98.4 F (36.9 C), height 5\' 1"  (1.549 m), weight 170 lb (77.1 kg), last menstrual period 02/20/2014, SpO2 100 %. Body mass index is 32.12 kg/m.  General: Cooperative, alert, well developed, in no acute distress. HEENT: Conjunctivae and lids unremarkable. Cardiovascular: Regular rhythm.  Lungs: Normal work of breathing. Neurologic: No focal deficits.   Lab Results  Component Value Date   CREATININE 0.64 07/08/2019   BUN 16 07/08/2019   NA 141 07/08/2019   K 4.0 07/08/2019   CL 101 07/08/2019   CO2 32 07/08/2019   Lab Results  Component Value Date   ALT 31 07/08/2019   AST 21 07/08/2019   ALKPHOS 115 07/08/2019   BILITOT 0.5 07/08/2019   Lab Results  Component Value Date   HGBA1C 5.5 09/11/2019   HGBA1C 5.4 05/06/2019   HGBA1C 5.9 (H) 01/14/2019   HGBA1C 5.9 (H) 08/30/2018   HGBA1C 6.1 06/08/2017   Lab Results  Component Value Date   INSULIN  4.0 09/11/2019   INSULIN 9.8 05/06/2019   INSULIN 8.8 01/14/2019   INSULIN 9.3 08/30/2018   Lab Results  Component Value Date   TSH 0.84 07/08/2019   Lab Results  Component Value Date   CHOL 182 09/13/2019   HDL 56.30 09/13/2019   LDLCALC 115 (H) 09/13/2019   LDLDIRECT 147.9 12/17/2012   TRIG 51.0 09/13/2019   CHOLHDL 3 09/13/2019   Lab Results  Component Value Date   WBC 4.7 09/13/2019   HGB 13.1 09/13/2019     HCT 39.8 09/13/2019   MCV 88.4 09/13/2019   PLT 134 (L) 09/13/2019   No results found for: IRON, TIBC, FERRITIN  Attestation Statements:   Reviewed by clinician on day of visit: allergies, medications, problem list, medical history, surgical history, family history, social history, and previous encounter notes.  Time spent on visit including pre-visit chart review and post-visit charting and care was 21 minutes.   IMichaelene Song, am acting as transcriptionist for Abby Potash, PA-C   I have reviewed the above documentation for accuracy and completeness, and I agree with the above. Abby Potash, PA-C

## 2019-12-06 MED FILL — traMADol HCL 50 MG TABS: 50 | 20 days supply | Qty: 40 | Fill #0

## 2019-12-17 DIAGNOSIS — M25512 Pain in left shoulder: Secondary | ICD-10-CM | POA: Diagnosis not present

## 2019-12-17 DIAGNOSIS — M25561 Pain in right knee: Secondary | ICD-10-CM | POA: Diagnosis not present

## 2019-12-17 DIAGNOSIS — M25562 Pain in left knee: Secondary | ICD-10-CM | POA: Diagnosis not present

## 2019-12-17 DIAGNOSIS — M25511 Pain in right shoulder: Secondary | ICD-10-CM | POA: Diagnosis not present

## 2019-12-17 MED FILL — traMADol HCL 50 MG TABS: 50 | 20 days supply | Qty: 40 | Fill #0

## 2019-12-17 MED FILL — MELOXICAM 15 MG TABLET: 15 | 30 days supply | Qty: 30 | Fill #0

## 2019-12-17 MED FILL — AMITRIPTYLINE HCL 25 MG TAB: 25 | 22 days supply | Qty: 30 | Fill #0

## 2019-12-18 MED FILL — LOSARTAN-HCTZ 100-25 MG TAB: 100-25 | 30 days supply | Qty: 30 | Fill #5

## 2019-12-24 ENCOUNTER — Ambulatory Visit (INDEPENDENT_AMBULATORY_CARE_PROVIDER_SITE_OTHER): Payer: 59 | Admitting: Adult Health

## 2019-12-24 ENCOUNTER — Encounter (INDEPENDENT_AMBULATORY_CARE_PROVIDER_SITE_OTHER): Payer: Self-pay | Admitting: Physician Assistant

## 2019-12-24 ENCOUNTER — Other Ambulatory Visit: Payer: Self-pay

## 2019-12-24 VITALS — BP 103/68 | HR 81 | Temp 98.2°F | Ht 61.0 in | Wt 170.0 lb

## 2019-12-24 DIAGNOSIS — R7303 Prediabetes: Secondary | ICD-10-CM | POA: Diagnosis not present

## 2019-12-24 DIAGNOSIS — E669 Obesity, unspecified: Secondary | ICD-10-CM | POA: Diagnosis not present

## 2019-12-24 DIAGNOSIS — I10 Essential (primary) hypertension: Secondary | ICD-10-CM

## 2019-12-24 DIAGNOSIS — D696 Thrombocytopenia, unspecified: Secondary | ICD-10-CM | POA: Diagnosis not present

## 2019-12-24 DIAGNOSIS — Z6832 Body mass index (BMI) 32.0-32.9, adult: Secondary | ICD-10-CM | POA: Diagnosis not present

## 2019-12-24 DIAGNOSIS — Z9189 Other specified personal risk factors, not elsewhere classified: Secondary | ICD-10-CM

## 2019-12-24 DIAGNOSIS — E559 Vitamin D deficiency, unspecified: Secondary | ICD-10-CM | POA: Diagnosis not present

## 2019-12-25 LAB — CBC WITH DIFFERENTIAL/PLATELET
Basophils Absolute: 0.1 10*3/uL (ref 0.0–0.2)
Basos: 1 %
EOS (ABSOLUTE): 0.1 10*3/uL (ref 0.0–0.4)
Eos: 2 %
Hematocrit: 40.2 % (ref 34.0–46.6)
Hemoglobin: 12.9 g/dL (ref 11.1–15.9)
Immature Grans (Abs): 0 10*3/uL (ref 0.0–0.1)
Immature Granulocytes: 0 %
Lymphocytes Absolute: 2.4 10*3/uL (ref 0.7–3.1)
Lymphs: 37 %
MCH: 27.8 pg (ref 26.6–33.0)
MCHC: 32.1 g/dL (ref 31.5–35.7)
MCV: 87 fL (ref 79–97)
Monocytes Absolute: 0.6 10*3/uL (ref 0.1–0.9)
Monocytes: 9 %
Neutrophils Absolute: 3.3 10*3/uL (ref 1.4–7.0)
Neutrophils: 51 %
Platelets: 135 10*3/uL — ABNORMAL LOW (ref 150–450)
RBC: 4.64 x10E6/uL (ref 3.77–5.28)
RDW: 13.2 % (ref 11.7–15.4)
WBC: 6.4 10*3/uL (ref 3.4–10.8)

## 2019-12-25 LAB — LIPID PANEL WITH LDL/HDL RATIO
Cholesterol, Total: 208 mg/dL — ABNORMAL HIGH (ref 100–199)
HDL: 78 mg/dL (ref >39)
LDL Chol Calc (NIH): 120 mg/dL — ABNORMAL HIGH (ref 0–99)
LDL/HDL Ratio: 1.5 ratio (ref 0.0–3.2)
Triglycerides: 54 mg/dL (ref 0–149)
VLDL Cholesterol Cal: 10 mg/dL (ref 5–40)

## 2019-12-25 LAB — COMPREHENSIVE METABOLIC PANEL
ALT: 39 IU/L — ABNORMAL HIGH (ref 0–32)
AST: 25 IU/L (ref 0–40)
Albumin/Globulin Ratio: 1.7 (ref 1.2–2.2)
Albumin: 4.7 g/dL (ref 3.8–4.9)
Alkaline Phosphatase: 163 IU/L — ABNORMAL HIGH (ref 48–121)
BUN/Creatinine Ratio: 31 — ABNORMAL HIGH (ref 9–23)
BUN: 18 mg/dL (ref 6–24)
Bilirubin Total: 0.3 mg/dL (ref 0.0–1.2)
CO2: 26 mmol/L (ref 20–29)
Calcium: 10.1 mg/dL (ref 8.7–10.2)
Chloride: 100 mmol/L (ref 96–106)
Creatinine, Ser: 0.59 mg/dL (ref 0.57–1.00)
GFR calc Af Amer: 118 mL/min/{1.73_m2} (ref >59)
GFR calc non Af Amer: 102 mL/min/{1.73_m2} (ref >59)
Globulin, Total: 2.8 g/dL (ref 1.5–4.5)
Glucose: 76 mg/dL (ref 65–99)
Potassium: 4.4 mmol/L (ref 3.5–5.2)
Sodium: 140 mmol/L (ref 134–144)
Total Protein: 7.5 g/dL (ref 6.0–8.5)

## 2019-12-25 LAB — VITAMIN D 25 HYDROXY (VIT D DEFICIENCY, FRACTURES): Vit D, 25-Hydroxy: 77.5 ng/mL (ref 30.0–100.0)

## 2019-12-25 LAB — HEMOGLOBIN A1C
Est. average glucose Bld gHb Est-mCnc: 114 mg/dL
Hgb A1c MFr Bld: 5.6 % (ref 4.8–5.6)

## 2019-12-25 LAB — INSULIN, RANDOM: INSULIN: 6.2 u[IU]/mL (ref 2.6–24.9)

## 2019-12-25 NOTE — Progress Notes (Signed)
Chief Complaint:   OBESITY Katherine Tanner is here to discuss her progress with her obesity treatment plan along with follow-up of her obesity related diagnoses. Katherine Tanner is on the Category 2 Plan and states she is following her eating plan approximately 95% of the time. Katherine Tanner states she is walking 30 minutes 3 times per week.  Today's visit was #: 28 Starting weight: 198 lbs Starting date: 08/30/2018 Today's weight: 170 lbs Today's date: 12/24/2019 Total lbs lost to date: 28 Total lbs lost since last in-office visit: 0  Interim History: Katherine Tanner recently traveled to [facility] with her husband and really focused on protein at each meal. She even took her food scale with her while on vacation - great! Her orthopedic surgeon/Dr. Berenice Primas, recently added amitriptyline in the evening to help control her bilateral knee pain- which she reports has reduced overall level of discomfort.   Subjective:   Prediabetes. Katherine Tanner has a diagnosis of prediabetes based on her elevated HgA1c and was informed this puts her at greater risk of developing diabetes. She continues to work on diet and exercise to decrease her risk of diabetes. She denies nausea or hypoglycemia. Katherine Tanner is on metformin 500 mg daily with meals and tolerating well. She denies polyphagia.  Lab Results  Component Value Date   HGBA1C 5.6 12/24/2019   Lab Results  Component Value Date   INSULIN 6.2 12/24/2019   INSULIN 4.0 09/11/2019   INSULIN 9.8 05/06/2019   INSULIN 8.8 01/14/2019   INSULIN 9.3 08/30/2018   Essential hypertension. Blood pressure is lower than typical for her. She denies cardiac symptoms. She is on losartan/HCTZ 100/25 mg daily. She has not been checking her blood pressure at home.  BP Readings from Last 3 Encounters:  12/24/19 103/68  12/03/19 116/65  11/14/19 123/71   Lab Results  Component Value Date   CREATININE 0.59 12/24/2019   CREATININE 0.64 07/08/2019   CREATININE 0.48 (L) 05/06/2019    Vitamin D deficiency. Last Vitamin D was 62.0 on 09/11/2019. Katherine Tanner is taking OTC Vitamin D3 2,000 IU daily.  Thrombocytopenia ([place]). Platelets have been trending down since 2019. She denies excessive bruising/bleeding and denies anti-platelet use. She denies family hx of blood dyscrasia.   At risk for activity intolerance. Katherine Tanner is at risk of exercise intolerance due to chronic bilateral knee pain.  Assessment/Plan:   Prediabetes. Katherine Tanner will continue to work on weight loss, exercise, and decreasing simple carbohydrates to help decrease the risk of diabetes. No refill was required today for metformin today. She will continue her Category 2 meal plan and will have hemoglobin A1c, Insulin, random, Lipid Panel With LDL/HDL Ratio checked today.  Essential hypertension. Katherine Tanner is working on healthy weight loss and exercise to improve blood pressure control. We will watch for signs of hypotension as she continues her lifestyle modifications. Katherine Tanner will check her blood pressure several times per week or if symptoms of hypotension develop and will bring her log to follow-up here. She will continue her Category 2 meal plan. Comprehensive metabolic panel, Lipid Panel With LDL/HDL Ratio will be checked today.  Vitamin D deficiency. Low Vitamin D level contributes to fatigue and are associated with obesity, breast, and colon cancer. VITAMIN D 25 Hydroxy (Vit-D Deficiency, Fractures) level will be checked today.  Thrombocytopenia ([place]). CBC with Differential/Platelet, Lipid Panel With LDL/HDL Ratio will be checked today.  At risk for activity intolerance. Katherine Tanner was given approximately 15 minutes of exercise intolerance counseling today. She is 59 y.o. female and  has risk factors exercise intolerance including obesity. We discussed intensive lifestyle modifications today with an emphasis on specific weight loss instructions and strategies. Katherine Tanner will slowly increase activity as tolerated.  Repetitive  spaced learning was employed today to elicit superior memory formation and behavioral change.  Class 1 obesity with serious comorbidity and body mass index (BMI) of 32.0 to 32.9 in adult, unspecified obesity type.  Katherine Tanner is currently in the action stage of change. As such, her goal is to continue with weight loss efforts. She has agreed to the Category 2 Plan.   Exercise goals: Katherine Tanner will continue her current exercise regimen.   Behavioral modification strategies: increasing lean protein intake, no skipping meals, meal planning and cooking strategies and planning for success.  Katherine Tanner has agreed to follow-up with our clinic in 2-3 weeks. She was informed of the importance of frequent follow-up visits to maximize her success with intensive lifestyle modifications for her multiple health conditions.   Katherine Tanner was informed we would discuss her lab results at her next visit unless there is a critical issue that needs to be addressed sooner. Katherine Tanner agreed to keep her next visit at the agreed upon time to discuss these results.  Objective:   Blood pressure 103/68, pulse 81, temperature 98.2 F (36.8 C), temperature source Oral, height 5\' 1"  (1.549 m), weight 170 lb (77.1 kg), last menstrual period 02/20/2014, SpO2 98 %. Body mass index is 32.12 kg/m.  General: Cooperative, alert, well developed, in no acute distress. HEENT: Conjunctivae and lids unremarkable. Cardiovascular: Regular rhythm.  Lungs: Normal work of breathing. Neurologic: No focal deficits.   Lab Results  Component Value Date   CREATININE 0.59 12/24/2019   BUN 18 12/24/2019   NA 140 12/24/2019   K 4.4 12/24/2019   CL 100 12/24/2019   CO2 26 12/24/2019   Lab Results  Component Value Date   ALT 39 (H) 12/24/2019   AST 25 12/24/2019   ALKPHOS 163 (H) 12/24/2019   BILITOT 0.3 12/24/2019   Lab Results  Component Value Date   HGBA1C 5.6 12/24/2019   HGBA1C 5.5 09/11/2019   HGBA1C 5.4 05/06/2019   HGBA1C 5.9 (H)  01/14/2019   HGBA1C 5.9 (H) 08/30/2018   Lab Results  Component Value Date   INSULIN 6.2 12/24/2019   INSULIN 4.0 09/11/2019   INSULIN 9.8 05/06/2019   INSULIN 8.8 01/14/2019   INSULIN 9.3 08/30/2018   Lab Results  Component Value Date   TSH 0.84 07/08/2019   Lab Results  Component Value Date   CHOL 208 (H) 12/24/2019   HDL 78 12/24/2019   LDLCALC 120 (H) 12/24/2019   LDLDIRECT 147.9 12/17/2012   TRIG 54 12/24/2019   CHOLHDL 3 09/13/2019   Lab Results  Component Value Date   WBC 6.4 12/24/2019   HGB 12.9 12/24/2019   HCT 40.2 12/24/2019   MCV 87 12/24/2019   PLT 135 (L) 12/24/2019   No results found for: IRON, TIBC, FERRITIN  Attestation Statements:   Reviewed by clinician on day of visit: allergies, medications, problem list, medical history, surgical history, family history, social history, and previous encounter notes.  I, Michaelene Song, am acting as Location manager for [employer], NP-C   I have reviewed the above documentation for accuracy and completeness, and I agree with the above. -  Esaw Grandchild, NP

## 2020-01-03 ENCOUNTER — Other Ambulatory Visit: Payer: Self-pay | Admitting: Family Medicine

## 2020-01-03 DIAGNOSIS — Z1231 Encounter for screening mammogram for malignant neoplasm of breast: Secondary | ICD-10-CM

## 2020-01-13 DIAGNOSIS — G8929 Other chronic pain: Secondary | ICD-10-CM | POA: Diagnosis not present

## 2020-01-13 DIAGNOSIS — M150 Primary generalized (osteo)arthritis: Secondary | ICD-10-CM | POA: Diagnosis not present

## 2020-01-13 DIAGNOSIS — M2550 Pain in unspecified joint: Secondary | ICD-10-CM | POA: Diagnosis not present

## 2020-01-13 DIAGNOSIS — Z832 Family history of diseases of the blood and blood-forming organs and certain disorders involving the immune mechanism: Secondary | ICD-10-CM | POA: Diagnosis not present

## 2020-01-13 DIAGNOSIS — Z6832 Body mass index (BMI) 32.0-32.9, adult: Secondary | ICD-10-CM | POA: Diagnosis not present

## 2020-01-13 DIAGNOSIS — E669 Obesity, unspecified: Secondary | ICD-10-CM | POA: Diagnosis not present

## 2020-01-13 MED FILL — traMADol HCL 50 MG TABS: 50 | 30 days supply | Qty: 30 | Fill #0

## 2020-01-13 MED FILL — DICLOFENAC SODIUM 75 MG TAB: 75 | 30 days supply | Qty: 60 | Fill #0

## 2020-01-20 MED FILL — LOSARTAN-HCTZ 100-25 MG TAB: 100-25 | 30 days supply | Qty: 30 | Fill #6

## 2020-01-20 MED FILL — ADVAIR 250/50 DISKUS: 250-50 | 30 days supply | Qty: 60 | Fill #1

## 2020-01-20 MED FILL — MONTELUKAST SOD 10 MG TAB: 10 | 90 days supply | Qty: 90 | Fill #3

## 2020-01-21 ENCOUNTER — Other Ambulatory Visit: Payer: Self-pay

## 2020-01-21 ENCOUNTER — Encounter (INDEPENDENT_AMBULATORY_CARE_PROVIDER_SITE_OTHER): Payer: Self-pay | Admitting: Physician Assistant

## 2020-01-21 ENCOUNTER — Ambulatory Visit (INDEPENDENT_AMBULATORY_CARE_PROVIDER_SITE_OTHER): Payer: 59 | Admitting: Physician Assistant

## 2020-01-21 VITALS — BP 127/80 | HR 78 | Temp 98.1°F | Ht 61.0 in | Wt 169.0 lb

## 2020-01-21 DIAGNOSIS — Z9189 Other specified personal risk factors, not elsewhere classified: Secondary | ICD-10-CM | POA: Diagnosis not present

## 2020-01-21 DIAGNOSIS — R7303 Prediabetes: Secondary | ICD-10-CM | POA: Diagnosis not present

## 2020-01-21 DIAGNOSIS — E7849 Other hyperlipidemia: Secondary | ICD-10-CM

## 2020-01-21 DIAGNOSIS — E669 Obesity, unspecified: Secondary | ICD-10-CM | POA: Diagnosis not present

## 2020-01-21 DIAGNOSIS — Z6831 Body mass index (BMI) 31.0-31.9, adult: Secondary | ICD-10-CM

## 2020-01-21 DIAGNOSIS — E66811 Obesity, class 1: Secondary | ICD-10-CM

## 2020-01-21 MED ORDER — METFORMIN HCL 500 MG PO TABS: 500.0000 mg | ORAL_TABLET | Freq: Every day | ORAL | 0 refills | Status: DC

## 2020-01-21 MED FILL — METFORMIN HCL 500 MG TABS: 500 | 30 days supply | Qty: 30 | Fill #0

## 2020-01-21 NOTE — Progress Notes (Signed)
Chief Complaint:   OBESITY Katherine Tanner is here to discuss her progress with her obesity treatment plan along with follow-up of her obesity related diagnoses. Katherine Tanner is on the Category 2 Plan and states she is following her eating plan approximately 100% of the time. Katherine Tanner states she is riding a bike 25 minutes 3 times per week.  Today's visit was #: 32 Starting weight: 198 lbs Starting date: 08/30/2018 Today's weight: 169 lbs Today's date: 01/21/2020 Total lbs lost to date: 29 Total lbs lost since last in-office visit: 1  Interim History: Katherine Tanner states that she has been having some joint pain and recently saw a rheumatologist. She is bored with breakfast and would like more options.  Subjective:   Prediabetes. Katherine Tanner has a diagnosis of prediabetes based on her elevated HgA1c and was informed this puts her at greater risk of developing diabetes. She continues to work on diet and exercise to decrease her risk of diabetes. Katherine Tanner is on metformin. No nausea, vomiting, or diarrhea.   Lab Results  Component Value Date   HGBA1C 5.6 12/24/2019   Lab Results  Component Value Date   INSULIN 6.2 12/24/2019   INSULIN 4.0 09/11/2019   INSULIN 9.8 05/06/2019   INSULIN 8.8 01/14/2019   INSULIN 9.3 08/30/2018   Other hyperlipidemia. Katherine Tanner is on no medication. No chest pain.   Lab Results  Component Value Date   CHOL 208 (H) 12/24/2019   HDL 78 12/24/2019   LDLCALC 120 (H) 12/24/2019   LDLDIRECT 147.9 12/17/2012   TRIG 54 12/24/2019   CHOLHDL 3 09/13/2019   Lab Results  Component Value Date   ALT 39 (H) 12/24/2019   AST 25 12/24/2019   ALKPHOS 163 (H) 12/24/2019   BILITOT 0.3 12/24/2019   The 10-year ASCVD risk score [name], et al., 2013) is: 4.3%   Values used to calculate the score:     Age: 86 years     Sex: Female     Is Non-Hispanic African American: Yes     Diabetic: No     Tobacco smoker: No     Systolic Blood Pressure: 283 mmHg     Is BP  treated: Yes     HDL Cholesterol: 78 mg/dL     Total Cholesterol: 208 mg/dL  At risk for heart disease. Katherine Tanner is at a higher than average risk for cardiovascular disease due to obesity.   Assessment/Plan:   Prediabetes. Katherine Tanner will continue to work on weight loss, exercise, and decreasing simple carbohydrates to help decrease the risk of diabetes. Refill was given for metformin 500 mg #30 with 0 refills.  Other hyperlipidemia. Cardiovascular risk and specific lipid/LDL goals reviewed.  We discussed several lifestyle modifications today and Katherine Tanner will continue to work on diet, exercise and weight loss efforts. Orders and follow up as documented in patient record.   Counseling Intensive lifestyle modifications are the first line treatment for this issue. . Dietary changes: Increase soluble fiber. Decrease simple carbohydrates. . Exercise changes: Moderate to vigorous-intensity aerobic activity 150 minutes per week if tolerated. . Lipid-lowering medications: see documented in medical record.  At risk for heart disease. Katherine Tanner was given approximately 15 minutes of coronary artery disease prevention counseling today. She is 59 y.o. female and has risk factors for heart disease including obesity. We discussed intensive lifestyle modifications today with an emphasis on specific weight loss instructions and strategies.   Repetitive spaced learning was employed today to elicit superior memory formation and behavioral  change.  Class 1 obesity with serious comorbidity and body mass index (BMI) of 31.0 to 31.9 in adult, unspecified obesity type.  Katherine Tanner is currently in the action stage of change. As such, her goal is to continue with weight loss efforts. She has agreed to the Category 2 Plan.   Exercise goals: For substantial health benefits, adults should do at least 150 minutes (2 hours and 30 minutes) a week of moderate-intensity, or 75 minutes (1 hour and 15 minutes) a week of vigorous-intensity  aerobic physical activity, or an equivalent combination of moderate- and vigorous-intensity aerobic activity. Aerobic activity should be performed in episodes of at least 10 minutes, and preferably, it should be spread throughout the week.  Behavioral modification strategies: meal planning and cooking strategies and planning for success.  Katherine Tanner has agreed to follow-up with our clinic in 3-4 weeks. She was informed of the importance of frequent follow-up visits to maximize her success with intensive lifestyle modifications for her multiple health conditions.   Objective:   Blood pressure 127/80, pulse 78, temperature 98.1 F (36.7 C), temperature source Oral, height 5\' 1"  (1.549 m), weight 169 lb (76.7 kg), last menstrual period 02/20/2014, SpO2 99 %. Body mass index is 31.93 kg/m.  General: Cooperative, alert, well developed, in no acute distress. HEENT: Conjunctivae and lids unremarkable. Cardiovascular: Regular rhythm.  Lungs: Normal work of breathing. Neurologic: No focal deficits.   Lab Results  Component Value Date   CREATININE 0.59 12/24/2019   BUN 18 12/24/2019   NA 140 12/24/2019   K 4.4 12/24/2019   CL 100 12/24/2019   CO2 26 12/24/2019   Lab Results  Component Value Date   ALT 39 (H) 12/24/2019   AST 25 12/24/2019   ALKPHOS 163 (H) 12/24/2019   BILITOT 0.3 12/24/2019   Lab Results  Component Value Date   HGBA1C 5.6 12/24/2019   HGBA1C 5.5 09/11/2019   HGBA1C 5.4 05/06/2019   HGBA1C 5.9 (H) 01/14/2019   HGBA1C 5.9 (H) 08/30/2018   Lab Results  Component Value Date   INSULIN 6.2 12/24/2019   INSULIN 4.0 09/11/2019   INSULIN 9.8 05/06/2019   INSULIN 8.8 01/14/2019   INSULIN 9.3 08/30/2018   Lab Results  Component Value Date   TSH 0.84 07/08/2019   Lab Results  Component Value Date   CHOL 208 (H) 12/24/2019   HDL 78 12/24/2019   LDLCALC 120 (H) 12/24/2019   LDLDIRECT 147.9 12/17/2012   TRIG 54 12/24/2019   CHOLHDL 3 09/13/2019   Lab Results    Component Value Date   WBC 6.4 12/24/2019   HGB 12.9 12/24/2019   HCT 40.2 12/24/2019   MCV 87 12/24/2019   PLT 135 (L) 12/24/2019   No results found for: IRON, TIBC, FERRITIN  Attestation Statements:   Reviewed by clinician on day of visit: allergies, medications, problem list, medical history, surgical history, family history, social history, and previous encounter notes.  IMichaelene Song, am acting as transcriptionist for Abby Potash, PA-C   I have reviewed the above documentation for accuracy and completeness, and I agree with the above. Abby Potash, PA-C

## 2020-01-29 ENCOUNTER — Ambulatory Visit: Payer: 59

## 2020-02-10 DIAGNOSIS — E669 Obesity, unspecified: Secondary | ICD-10-CM | POA: Diagnosis not present

## 2020-02-10 DIAGNOSIS — Z832 Family history of diseases of the blood and blood-forming organs and certain disorders involving the immune mechanism: Secondary | ICD-10-CM | POA: Diagnosis not present

## 2020-02-10 DIAGNOSIS — G8929 Other chronic pain: Secondary | ICD-10-CM | POA: Diagnosis not present

## 2020-02-10 DIAGNOSIS — Z6832 Body mass index (BMI) 32.0-32.9, adult: Secondary | ICD-10-CM | POA: Diagnosis not present

## 2020-02-10 DIAGNOSIS — M2550 Pain in unspecified joint: Secondary | ICD-10-CM | POA: Diagnosis not present

## 2020-02-10 DIAGNOSIS — M150 Primary generalized (osteo)arthritis: Secondary | ICD-10-CM | POA: Diagnosis not present

## 2020-02-10 MED FILL — traMADol HCL 50 MG TABS: 50 | 30 days supply | Qty: 30 | Fill #0

## 2020-02-11 ENCOUNTER — Ambulatory Visit (INDEPENDENT_AMBULATORY_CARE_PROVIDER_SITE_OTHER): Payer: 59 | Admitting: Family Medicine

## 2020-02-12 ENCOUNTER — Other Ambulatory Visit: Payer: Self-pay

## 2020-02-12 ENCOUNTER — Ambulatory Visit (INDEPENDENT_AMBULATORY_CARE_PROVIDER_SITE_OTHER): Payer: 59 | Admitting: Family Medicine

## 2020-02-12 ENCOUNTER — Encounter (INDEPENDENT_AMBULATORY_CARE_PROVIDER_SITE_OTHER): Payer: Self-pay | Admitting: Family Medicine

## 2020-02-12 VITALS — BP 103/68 | HR 71 | Temp 98.1°F | Ht 61.0 in | Wt 168.0 lb

## 2020-02-12 DIAGNOSIS — Z6831 Body mass index (BMI) 31.0-31.9, adult: Secondary | ICD-10-CM

## 2020-02-12 DIAGNOSIS — E669 Obesity, unspecified: Secondary | ICD-10-CM

## 2020-02-12 DIAGNOSIS — R7303 Prediabetes: Secondary | ICD-10-CM | POA: Diagnosis not present

## 2020-02-12 DIAGNOSIS — I10 Essential (primary) hypertension: Secondary | ICD-10-CM | POA: Diagnosis not present

## 2020-02-12 DIAGNOSIS — Z9189 Other specified personal risk factors, not elsewhere classified: Secondary | ICD-10-CM | POA: Diagnosis not present

## 2020-02-12 MED ORDER — METFORMIN HCL 500 MG PO TABS: 500.0000 mg | ORAL_TABLET | Freq: Every day | ORAL | 0 refills | Status: DC

## 2020-02-12 MED FILL — AMITRIPTYLINE HCL 25 MG TAB: 25 | 7 days supply | Qty: 10 | Fill #1

## 2020-02-13 NOTE — Progress Notes (Signed)
Chief Complaint:   OBESITY Katherine Tanner is here to discuss her progress with her obesity treatment plan along with follow-up of her obesity related diagnoses. Katherine Tanner is on the Category 2 Plan and states she is following her eating plan approximately 100% of the time. Katherine Tanner states she is doing increased activity for 20-25 minutes 5 times per week.  Today's visit was #: 63 Starting weight: 198 lbs Starting date: 08/30/2018 Today's weight: 168 lbs Today's date: 02/12/2020 Total lbs lost to date: 30 lbs Total lbs lost since last in-office visit: 1 lb Total weight loss percentage to date: -15.15%  Interim History: Katherine Tanner is 30 pounds down today!  She has been to Rheumatology and had a negative workup.  They are questioning nerve pain.  For OA, she has been referred to pain management.  She says her pain is progressing despite her weight loss.  She got her COVID vaccine.  She is happy with her weight and current plan.  Subjective:   1. Essential hypertension Review: taking medications as instructed, no medication side effects noted, no chest pain on exertion, no dyspnea on exertion, no swelling of ankles.  Blood pressure is low today.  BP Readings from Last 3 Encounters:  02/12/20 103/68  01/21/20 127/80  12/24/19 103/68   2. Prediabetes  Lab Results  Component Value Date   HGBA1C 5.6 12/24/2019   Lab Results  Component Value Date   INSULIN 6.2 12/24/2019   INSULIN 4.0 09/11/2019   INSULIN 9.8 05/06/2019   INSULIN 8.8 01/14/2019   INSULIN 9.3 08/30/2018   Assessment/Plan:   1. Essential hypertension Katherine Tanner is working on healthy weight loss and exercise to improve blood pressure control. We will watch for signs of hypotension as she continues her lifestyle modifications.  She will cut her Hyzaar dose in half due to low blood pressure.  2. Prediabetes Goal is HgbA1c < 5.7 and insulin level closer to 5. Katherine Tanner will continue to work on weight loss, exercise, and decreasing simple  carbohydrates to help decrease the risk of diabetes.   - metFORMIN (GLUCOPHAGE) 500 MG tablet; Take 1 tablet (500 mg total) by mouth daily.  Dispense: 30 tablet; Refill: 0  3. At risk for heart disease Katherine Tanner was given approximately 15 minutes of coronary artery disease prevention counseling today. She is 59 y.o. female and has risk factors for heart disease including obesity and prediabetes. We discussed intensive lifestyle modifications today with an emphasis on specific weight loss instructions and strategies.   4. Class 1 obesity with serious comorbidity and body mass index (BMI) of 31.0 to 31.9 in adult, unspecified obesity type Katherine Tanner is currently in the action stage of change. As such, her goal is to continue with weight loss efforts. She has agreed to the Category 2 Plan.   Exercise goals: For substantial health benefits, adults should do at least 150 minutes (2 hours and 30 minutes) a week of moderate-intensity, or 75 minutes (1 hour and 15 minutes) a week of vigorous-intensity aerobic physical activity, or an equivalent combination of moderate- and vigorous-intensity aerobic activity. Aerobic activity should be performed in episodes of at least 10 minutes, and preferably, it should be spread throughout the week.  Behavioral modification strategies: increasing lean protein intake, decreasing simple carbohydrates, increasing vegetables and increasing water intake.  Katherine Tanner has agreed to follow-up with our clinic in 2-3 weeks. She was informed of the importance of frequent follow-up visits to maximize her success with intensive lifestyle modifications for her multiple health  conditions.   Objective:   Blood pressure 103/68, pulse 71, temperature 98.1 F (36.7 C), temperature source Oral, height 5\' 1"  (1.549 m), weight 168 lb (76.2 kg), last menstrual period 02/20/2014, SpO2 100 %. Body mass index is 31.74 kg/m.  General: Cooperative, alert, well developed, in no acute distress. HEENT:  Conjunctivae and lids unremarkable. Cardiovascular: Regular rhythm.  Lungs: Normal work of breathing. Neurologic: No focal deficits.   Lab Results  Component Value Date   CREATININE 0.59 12/24/2019   BUN 18 12/24/2019   NA 140 12/24/2019   K 4.4 12/24/2019   CL 100 12/24/2019   CO2 26 12/24/2019   Lab Results  Component Value Date   ALT 39 (H) 12/24/2019   AST 25 12/24/2019   ALKPHOS 163 (H) 12/24/2019   BILITOT 0.3 12/24/2019   Lab Results  Component Value Date   HGBA1C 5.6 12/24/2019   HGBA1C 5.5 09/11/2019   HGBA1C 5.4 05/06/2019   HGBA1C 5.9 (H) 01/14/2019   HGBA1C 5.9 (H) 08/30/2018   Lab Results  Component Value Date   INSULIN 6.2 12/24/2019   INSULIN 4.0 09/11/2019   INSULIN 9.8 05/06/2019   INSULIN 8.8 01/14/2019   INSULIN 9.3 08/30/2018   Lab Results  Component Value Date   TSH 0.84 07/08/2019   Lab Results  Component Value Date   CHOL 208 (H) 12/24/2019   HDL 78 12/24/2019   LDLCALC 120 (H) 12/24/2019   LDLDIRECT 147.9 12/17/2012   TRIG 54 12/24/2019   CHOLHDL 3 09/13/2019   Lab Results  Component Value Date   WBC 6.4 12/24/2019   HGB 12.9 12/24/2019   HCT 40.2 12/24/2019   MCV 87 12/24/2019   PLT 135 (L) 12/24/2019   Attestation Statements:   Reviewed by clinician on day of visit: allergies, medications, problem list, medical history, surgical history, family history, social history, and previous encounter notes.  I, Water quality scientist, CMA, am acting as transcriptionist for Briscoe Deutscher, DO  I have reviewed the above documentation for accuracy and completeness, and I agree with the above. Briscoe Deutscher, DO

## 2020-02-17 MED FILL — LOSARTAN-HCTZ 100-25 MG TAB: 100-25 | 30 days supply | Qty: 30 | Fill #7

## 2020-02-29 MED FILL — MELOXICAM 15 MG TABLET: 15 | 30 days supply | Qty: 30 | Fill #1

## 2020-02-29 MED FILL — METFORMIN HCL 500 MG TABS: 500 | 30 days supply | Qty: 30 | Fill #0

## 2020-03-04 ENCOUNTER — Encounter (INDEPENDENT_AMBULATORY_CARE_PROVIDER_SITE_OTHER): Payer: Self-pay | Admitting: Adult Health

## 2020-03-04 ENCOUNTER — Other Ambulatory Visit: Payer: Self-pay

## 2020-03-04 ENCOUNTER — Ambulatory Visit (INDEPENDENT_AMBULATORY_CARE_PROVIDER_SITE_OTHER): Payer: 59 | Admitting: Adult Health

## 2020-03-04 VITALS — BP 134/80 | HR 78 | Temp 98.0°F | Ht 61.0 in | Wt 168.0 lb

## 2020-03-04 DIAGNOSIS — R7303 Prediabetes: Secondary | ICD-10-CM

## 2020-03-04 DIAGNOSIS — E669 Obesity, unspecified: Secondary | ICD-10-CM | POA: Diagnosis not present

## 2020-03-04 DIAGNOSIS — I10 Essential (primary) hypertension: Secondary | ICD-10-CM | POA: Diagnosis not present

## 2020-03-04 DIAGNOSIS — E782 Mixed hyperlipidemia: Secondary | ICD-10-CM

## 2020-03-04 DIAGNOSIS — Z6831 Body mass index (BMI) 31.0-31.9, adult: Secondary | ICD-10-CM

## 2020-03-05 NOTE — Progress Notes (Signed)
Chief Complaint:   OBESITY Katherine Tanner is here to discuss her progress with her obesity treatment plan along with follow-up of her obesity related diagnoses. Katherine Tanner is on the Category 2 Plan and states she is following her eating plan approximately 95% of the time. Katherine Tanner states she is biking/walking 25-30 minutes 5 times per week.  Today's visit was #: 4 Starting weight: 198 lbs Starting date: 08/30/2018 Today's weight: 168 lbs Today's date: 03/04/2020 Total lbs lost to date: 30 Total lbs lost since last in-office visit: 0  Interim History: Katherine Tanner continues to enjoy the foods and structure of the Category 2 meal plan. When she eats out, which is very infrequent, she follows [facility]. She has been using 100 calorie prepackaged snacks, i.e., Protein One Bar, Pringles. Dr. Bonne Dolores surgeon, referred Adela Lank to a local pain clinic to manage chronic bilateral knee and left shoulder pain.  Subjective:   Essential hypertension. Blood pressure is stable today; was low at last office visit. Katherine Tanner is on 1/2 tab losartan/HCTZ 100/25 mg daily. The dosage was reduced at her last office visit.  BP Readings from Last 3 Encounters:  03/04/20 134/80  02/12/20 103/68  01/21/20 127/80   Lab Results  Component Value Date   CREATININE 0.59 12/24/2019   CREATININE 0.64 07/08/2019   CREATININE 0.48 (L) 05/06/2019   Prediabetes. Katherine Tanner has a diagnosis of prediabetes based on her elevated HgA1c and was informed this puts her at greater risk of developing diabetes. She continues to work on diet and exercise to decrease her risk of diabetes. She denies nausea or hypoglycemia. A1c 12/24/2019 5.6, blood glucose 76, and insulin level was 6.2. Katherine Tanner is on metformin 500 mg daily and denies GI upset or polyphagia.  Lab Results  Component Value Date   HGBA1C 5.6 12/24/2019   Lab Results  Component Value Date   INSULIN 6.2 12/24/2019   INSULIN 4.0 09/11/2019   INSULIN 9.8 05/06/2019    INSULIN 8.8 01/14/2019   INSULIN 9.3 08/30/2018   Mixed hyperlipidemia. Katherine Tanner is not on statin therapy and denies acute cardiac symptoms. She denies tobacco/vape use.   Lab Results  Component Value Date   CHOL 208 (H) 12/24/2019   HDL 78 12/24/2019   LDLCALC 120 (H) 12/24/2019   LDLDIRECT 147.9 12/17/2012   TRIG 54 12/24/2019   CHOLHDL 3 09/13/2019   Lab Results  Component Value Date   ALT 39 (H) 12/24/2019   AST 25 12/24/2019   ALKPHOS 163 (H) 12/24/2019   BILITOT 0.3 12/24/2019   The 10-year ASCVD risk score Katherine Bussing DC Jr., Katherine al., Katherine Tanner) is: 5.1%   Values used to calculate the score:     Age: 59 years     Sex: Female     Is Non-Hispanic African American: Yes     Diabetic: No     Tobacco smoker: No     Systolic Blood Pressure: 081 mmHg     Is BP treated: Yes     HDL Cholesterol: 78 mg/dL     Total Cholesterol: 208 mg/dL  Assessment/Plan:   Essential hypertension. Katherine Tanner is working on healthy weight loss and exercise to improve blood pressure control. We will watch for signs of hypotension as she continues her lifestyle modifications. She will continue 1/2 tab Hyzaar and continue to follow the Category 2 meal plan. Labs will be checked at her next office visit.  Prediabetes. Katherine Tanner will continue to work on weight loss, exercise, and decreasing simple carbohydrates to help decrease the  risk of diabetes. She will continue metformin as directed and continue following the Category 2 meal plan. Labs will be checked at her next office visit.  Mixed hyperlipidemia. Cardiovascular risk and specific lipid/LDL goals reviewed.  We discussed several lifestyle modifications today and Katherine Tanner will continue to work on diet, exercise and weight loss efforts. Orders and follow up as documented in patient record. She will limit her saturated fat intake. Labs will be checked at her next office visit.  Counseling Intensive lifestyle modifications are the first line treatment for this  issue. . Dietary changes: Increase soluble fiber. Decrease simple carbohydrates. . Exercise changes: Moderate to vigorous-intensity aerobic activity 150 minutes per week if tolerated. . Lipid-lowering medications: see documented in medical record.  Class 1 obesity with serious comorbidity and body mass index (BMI) of 31.0 to 31.9 in adult, unspecified obesity type.  Katherine Tanner is currently in the action stage of change. As such, her goal is to continue with weight loss efforts. She has agreed to the Category 2 Plan.   Exercise goals: Katherine Tanner will continue her current exercise regimen of biking/walking 250-30 minutes 5 times per week.   Behavioral modification strategies: increasing lean protein intake, meal planning and cooking strategies and planning for success.  Katherine Tanner has agreed to follow-up with our clinic fasting in 3 weeks. She was informed of the importance of frequent follow-up visits to maximize her success with intensive lifestyle modifications for her multiple health conditions.   Objective:   Blood pressure 134/80, pulse 78, temperature 98 F (36.7 C), temperature source Oral, height 5\' 1"  (1.549 m), weight 168 lb (76.2 kg), last menstrual period 02/20/2014, SpO2 99 %. Body mass index is 31.74 kg/m.  General: Cooperative, alert, well developed, in no acute distress. HEENT: Conjunctivae and lids unremarkable. Cardiovascular: Regular rhythm.  Lungs: Normal work of breathing. Neurologic: No focal deficits.   Lab Results  Component Value Date   CREATININE 0.59 12/24/2019   BUN 18 12/24/2019   NA 140 12/24/2019   K 4.4 12/24/2019   CL 100 12/24/2019   CO2 26 12/24/2019   Lab Results  Component Value Date   ALT 39 (H) 12/24/2019   AST 25 12/24/2019   ALKPHOS 163 (H) 12/24/2019   BILITOT 0.3 12/24/2019   Lab Results  Component Value Date   HGBA1C 5.6 12/24/2019   HGBA1C 5.5 09/11/2019   HGBA1C 5.4 05/06/2019   HGBA1C 5.9 (H) 01/14/2019   HGBA1C 5.9 (H) 08/30/2018    Lab Results  Component Value Date   INSULIN 6.2 12/24/2019   INSULIN 4.0 09/11/2019   INSULIN 9.8 05/06/2019   INSULIN 8.8 01/14/2019   INSULIN 9.3 08/30/2018   Lab Results  Component Value Date   TSH 0.84 07/08/2019   Lab Results  Component Value Date   CHOL 208 (H) 12/24/2019   HDL 78 12/24/2019   LDLCALC 120 (H) 12/24/2019   LDLDIRECT 147.9 12/17/2012   TRIG 54 12/24/2019   CHOLHDL 3 09/13/2019   Lab Results  Component Value Date   WBC 6.4 12/24/2019   HGB 12.9 12/24/2019   HCT 40.2 12/24/2019   MCV 87 12/24/2019   PLT 135 (L) 12/24/2019   No results found for: IRON, TIBC, FERRITIN  Attestation Statements:   Reviewed by clinician on day of visit: allergies, medications, problem list, medical history, surgical history, family history, social history, and previous encounter notes.  Time spent on visit including pre-visit chart review and post-visit charting and care was 29 minutes.   Rudene Christians  Su Hoff, am acting as Location manager for [employer], NP-C   I have reviewed the above documentation for accuracy and completeness, and I agree with the above. -  Esaw Grandchild, NP

## 2020-03-14 MED FILL — traMADol HCL 50 MG TABS: 50 | 20 days supply | Qty: 40 | Fill #0

## 2020-03-20 DIAGNOSIS — Z79899 Other long term (current) drug therapy: Secondary | ICD-10-CM | POA: Diagnosis not present

## 2020-03-20 DIAGNOSIS — G894 Chronic pain syndrome: Secondary | ICD-10-CM | POA: Diagnosis not present

## 2020-03-20 DIAGNOSIS — E559 Vitamin D deficiency, unspecified: Secondary | ICD-10-CM | POA: Diagnosis not present

## 2020-03-20 DIAGNOSIS — Z96651 Presence of right artificial knee joint: Secondary | ICD-10-CM | POA: Diagnosis not present

## 2020-03-20 MED FILL — GABAPENTIN 100 MG CAPSULE: 100 | 15 days supply | Qty: 15 | Fill #0

## 2020-03-23 ENCOUNTER — Other Ambulatory Visit (INDEPENDENT_AMBULATORY_CARE_PROVIDER_SITE_OTHER): Payer: Self-pay | Admitting: Family Medicine

## 2020-03-23 ENCOUNTER — Encounter (INDEPENDENT_AMBULATORY_CARE_PROVIDER_SITE_OTHER): Payer: Self-pay | Admitting: Adult Health

## 2020-03-23 ENCOUNTER — Ambulatory Visit (INDEPENDENT_AMBULATORY_CARE_PROVIDER_SITE_OTHER): Payer: 59 | Admitting: Adult Health

## 2020-03-23 ENCOUNTER — Other Ambulatory Visit: Payer: Self-pay

## 2020-03-23 ENCOUNTER — Ambulatory Visit: Payer: 59

## 2020-03-23 VITALS — BP 131/75 | HR 78 | Temp 98.1°F | Ht 61.0 in | Wt 165.0 lb

## 2020-03-23 DIAGNOSIS — R7303 Prediabetes: Secondary | ICD-10-CM

## 2020-03-23 DIAGNOSIS — E669 Obesity, unspecified: Secondary | ICD-10-CM

## 2020-03-23 DIAGNOSIS — I10 Essential (primary) hypertension: Secondary | ICD-10-CM | POA: Diagnosis not present

## 2020-03-23 DIAGNOSIS — Z9189 Other specified personal risk factors, not elsewhere classified: Secondary | ICD-10-CM | POA: Diagnosis not present

## 2020-03-23 DIAGNOSIS — Z6831 Body mass index (BMI) 31.0-31.9, adult: Secondary | ICD-10-CM

## 2020-03-23 DIAGNOSIS — E782 Mixed hyperlipidemia: Secondary | ICD-10-CM

## 2020-03-23 DIAGNOSIS — E8881 Metabolic syndrome: Secondary | ICD-10-CM | POA: Diagnosis not present

## 2020-03-23 MED FILL — ADVAIR 250/50 DISKUS: 250-50 | 30 days supply | Qty: 60 | Fill #2

## 2020-03-24 ENCOUNTER — Encounter (INDEPENDENT_AMBULATORY_CARE_PROVIDER_SITE_OTHER): Payer: Self-pay | Admitting: Adult Health

## 2020-03-24 LAB — COMPREHENSIVE METABOLIC PANEL
ALT: 49 IU/L — ABNORMAL HIGH (ref 0–32)
AST: 26 IU/L (ref 0–40)
Albumin/Globulin Ratio: 1.8 (ref 1.2–2.2)
Albumin: 4.9 g/dL (ref 3.8–4.9)
Alkaline Phosphatase: 157 IU/L — ABNORMAL HIGH (ref 44–121)
BUN/Creatinine Ratio: 29 — ABNORMAL HIGH (ref 9–23)
BUN: 15 mg/dL (ref 6–24)
Bilirubin Total: 0.3 mg/dL (ref 0.0–1.2)
CO2: 28 mmol/L (ref 20–29)
Calcium: 10.1 mg/dL (ref 8.7–10.2)
Chloride: 102 mmol/L (ref 96–106)
Creatinine, Ser: 0.52 mg/dL — ABNORMAL LOW (ref 0.57–1.00)
GFR calc Af Amer: 123 mL/min/{1.73_m2} (ref >59)
GFR calc non Af Amer: 106 mL/min/{1.73_m2} (ref >59)
Globulin, Total: 2.7 g/dL (ref 1.5–4.5)
Glucose: 96 mg/dL (ref 65–99)
Potassium: 4.5 mmol/L (ref 3.5–5.2)
Sodium: 144 mmol/L (ref 134–144)
Total Protein: 7.6 g/dL (ref 6.0–8.5)

## 2020-03-24 LAB — LIPID PANEL
Chol/HDL Ratio: 3.2 ratio (ref 0.0–4.4)
Cholesterol, Total: 226 mg/dL — ABNORMAL HIGH (ref 100–199)
HDL: 71 mg/dL (ref >39)
LDL Chol Calc (NIH): 145 mg/dL — ABNORMAL HIGH (ref 0–99)
Triglycerides: 59 mg/dL (ref 0–149)
VLDL Cholesterol Cal: 10 mg/dL (ref 5–40)

## 2020-03-24 LAB — HEMOGLOBIN A1C
Est. average glucose Bld gHb Est-mCnc: 117 mg/dL
Hgb A1c MFr Bld: 5.7 % — ABNORMAL HIGH (ref 4.8–5.6)

## 2020-03-24 LAB — INSULIN, RANDOM: INSULIN: 11.2 u[IU]/mL (ref 2.6–24.9)

## 2020-03-24 MED FILL — MELOXICAM 15 MG TABLET: 15 | 30 days supply | Qty: 30 | Fill #2

## 2020-03-25 ENCOUNTER — Ambulatory Visit
Admission: RE | Admit: 2020-03-25 | Discharge: 2020-03-25 | Disposition: A | Discharge status: Home / Self Care | Payer: 59 | Source: Ambulatory Visit | Attending: Family Medicine | Admitting: Family Medicine

## 2020-03-25 ENCOUNTER — Other Ambulatory Visit: Payer: Self-pay

## 2020-03-25 DIAGNOSIS — Z1231 Encounter for screening mammogram for malignant neoplasm of breast: Secondary | ICD-10-CM

## 2020-03-25 NOTE — Progress Notes (Signed)
Chief Complaint:   OBESITY Katherine Tanner is here to discuss her progress with her obesity treatment plan along with follow-up of her obesity related diagnoses. [name] is on the Category 2 Plan and states she is following her eating plan approximately 100% of the time. [name] states she is biking/walking 25-30 minutes 3 times per week.  Today's visit was #: 10 Starting weight: 198 lbs Starting date: 08/30/2018 Today's weight: 165 lbs Today's date: 03/23/2020 Total lbs lost to date: 33 Total lbs lost since last in-office visit: 3  Interim History: [name] recently established with [facility] and is on Tramadol 50 mg BID and recently started gabapentin 100 mg QHS. She will take tramadol when coming off shift - she works in the lab at [employer]. She has surpassed her original goal of losing down to 170 lbs, weighing in at 165 lbs today with a BMI of 31.  Subjective:   Prediabetes. Kytzia has a diagnosis of prediabetes based on her elevated HgA1c and was informed this puts her at greater risk of developing diabetes. She continues to work on diet and exercise to decrease her risk of diabetes. She denies nausea or hypoglycemia. A1c 5.6, blood glucose and insulin both stable on 12/24/2019. [name] is on metformin 500 mg daily. 12/24/2019 CMP showed a GFR of greater than 60.  Lab Results  Component Value Date   HGBA1C 5.6  12/24/2019   Lab Results  Component Value Date   INSULIN 6.2 12/24/2019   INSULIN 4.0 09/11/2019   INSULIN 9.8 05/06/2019   INSULIN 8.8 01/14/2019   Essential hypertension. Blood pressure is stable at today's office visit. [name] is on losartan/HCTZ 100/25 mg 1/2 tab daily. She denies acute cardiac symptoms.  BP Readings from Last 3 Encounters:  03/23/20 131/75  03/04/20 134/80  02/12/20 103/68   Lab Results  Component Value Date   CREATININE 0.59 12/24/2019   CREATININE 0.64 07/08/2019   Mixed hyperlipidemia. 12/24/2019 total  and LDL cholesterol levels were above goal. [name] is not on a statin. She is on OTC Red Yeast Rice.   At risk for heart disease. [name] is at a higher than average risk for cardiovascular disease due to hyperlipidemia, obesity, and prediabetes.   Assessment/Plan:   Prediabetes.  [name] will continue to work on weight loss, exercise, and decreasing simple carbohydrates to help decrease the risk of diabetes. Refill was given for metformin 500 mg daily. Labs will be checked today.  Essential hypertension.  [name] is working on healthy weight loss and exercise to improve blood pressure control. We will watch for signs of hypotension as she continues her lifestyle modifications. She will continue to follow the Category 2 meal plan and continue her medications as directed. Labs will be checked today.  Mixed hyperlipidemia.  Cardiovascular risk and specific lipid/LDL goals reviewed.  We discussed several lifestyle modifications today and [name] will continue to work on diet, exercise and weight loss efforts. Orders and follow up as documented in patient record. If lipid levels remain above goal, will discuss statin therapy at her follow-up office visit. Labs will be checked today.  Counseling Intensive lifestyle modifications are the first line treatment for this issue. . Dietary changes: Increase soluble fiber. Decrease simple carbohydrates. . Exercise changes: Moderate to vigorous-intensity aerobic activity 150 minutes per week if tolerated.  . Lipid-lowering medications: see documented in medical record.  At risk for heart disease. Jaeline was given approximately 15 minutes of coronary artery disease prevention counseling today.  She is 59 y.o. female and has risk factors for heart disease including obesity. We discussed intensive lifestyle modifications today with an emphasis on specific weight loss instructions and strategies.   Repetitive spaced learning was employed today to elicit superior memory  formation and behavioral change.  Class 1 obesity with serious comorbidity and body mass index (BMI) of 31.0 to 31.9 in adult, unspecified obesity type.  [name] is currently in the action stage of change. As such, her goal is to continue with weight loss efforts. She has agreed to the Category 2 Plan.   Exercise goals: [name] will continue her current exercise regimen.   Behavioral modification strategies: increasing lean protein intake, meal planning and cooking strategies and planning for success.  [name] has agreed to follow-up with our clinic in 2 weeks. She was informed of the importance of frequent follow-up visits to maximize her success with intensive lifestyle modifications for her multiple health conditions.   [name] was informed we would discuss her lab results at her next visit unless there is a critical issue that needs to be addressed sooner. [name] agreed to keep her next visit at the agreed upon time to discuss these results.  Objective:   Blood pressure 131/75, pulse 78, temperature 98.1 F (36.7 C), height 5\' 1"  (1.549 m), weight 165 lb (74.8 kg), last menstrual period 02/20/2014, SpO2 100 %. Body mass index is 31.18 kg/m.  General: Cooperative, alert, well developed, in no acute distress. HEENT: Conjunctivae and lids unremarkable. Cardiovascular: Regular rhythm.  Lungs: Normal work of breathing. Neurologic: No focal deficits.   Lab Results  Component Value Date   HGBA1C 5.6 12/24/2019   HGBA1C 5.5 09/11/2019   HGBA1C 5.4 05/06/2019   HGBA1C 5.9 (H) 01/14/2019   Lab Results  Component Value Date   INSULIN 6.2 12/24/2019   INSULIN 4.0 09/11/2019   INSULIN 9.8 05/06/2019   INSULIN 8.8 01/14/2019   Lab Results  Component Value Date   TSH 0.84 07/08/2019   Lab Results  Component Value Date   WBC 6.4 12/24/2019   HGB 12.9 12/24/2019   HCT 40.2 12/24/2019   MCV 87 12/24/2019   PLT 135 (L) 12/24/2019   No results found for: IRON, TIBC,  FERRITIN  Attestation Statements:   Reviewed by clinician on day of visit: allergies, medications, problem list, medical history, surgical history, family history, social history, and previous encounter notes.  I, Michaelene Song, am acting as Location manager for [employer], NP-C   I have reviewed the above documentation for accuracy and completeness, and I agree with the above. -  Esaw Grandchild, NP

## 2020-03-25 NOTE — Telephone Encounter (Signed)
Please advise  Cholesterol, Total 100 - 199 mg/dL 226High  208High   Triglycerides 0 - 149 mg/dL 59  54  51.0 CM  HDL >39 mg/dL 71  78  56.30 R  VLDL Cholesterol Cal 5 - 40 mg/dL 10  10   LDL Chol Calc (NIH) 0 - 99 mg/dL 145High  120High   Chol/HDL Ratio 0.0 - 4.4 ratio 3.2   3 R, CM

## 2020-03-26 ENCOUNTER — Encounter: Payer: Self-pay | Admitting: Family Medicine

## 2020-03-26 DIAGNOSIS — E7800 Pure hypercholesterolemia, unspecified: Secondary | ICD-10-CM

## 2020-03-27 NOTE — Telephone Encounter (Signed)
Patient called.  Patient said she will try Crestor. Patient uses [facility].

## 2020-03-29 ENCOUNTER — Other Ambulatory Visit: Payer: Self-pay | Admitting: Family Medicine

## 2020-03-29 MED ORDER — ROSUVASTATIN CALCIUM 10 MG PO TABS: 10.0000 mg | ORAL_TABLET | Freq: Every day | ORAL | 3 refills | Status: DC

## 2020-03-29 NOTE — Telephone Encounter (Signed)
I sent crestor  Please check lipids in about 6 weeks

## 2020-03-30 ENCOUNTER — Other Ambulatory Visit (INDEPENDENT_AMBULATORY_CARE_PROVIDER_SITE_OTHER): Payer: Self-pay | Admitting: Adult Health

## 2020-03-30 ENCOUNTER — Other Ambulatory Visit (INDEPENDENT_AMBULATORY_CARE_PROVIDER_SITE_OTHER): Payer: Self-pay | Admitting: Family Medicine

## 2020-03-30 ENCOUNTER — Encounter (INDEPENDENT_AMBULATORY_CARE_PROVIDER_SITE_OTHER): Payer: Self-pay

## 2020-03-30 DIAGNOSIS — R7303 Prediabetes: Secondary | ICD-10-CM

## 2020-03-30 MED ORDER — METFORMIN HCL 500 MG PO TABS: 500.0000 mg | ORAL_TABLET | Freq: Every day | ORAL | 0 refills | Status: DC

## 2020-03-30 MED FILL — METFORMIN HCL 500 MG TABS: 500 | 30 days supply | Qty: 30 | Fill #0

## 2020-03-30 MED FILL — ROSUVASTATIN CALCIUM 10 MG: 10 | 90 days supply | Qty: 90 | Fill #0

## 2020-03-30 NOTE — Addendum Note (Signed)
Addended by: Lebron Conners on: 03/30/2020 11:39 AM   Modules accepted: Orders

## 2020-03-30 NOTE — Telephone Encounter (Signed)
Pt notified Rx sent and lab appt scheduled

## 2020-03-30 NOTE — Telephone Encounter (Signed)
Message sent to pt.

## 2020-04-03 ENCOUNTER — Other Ambulatory Visit (HOSPITAL_COMMUNITY): Payer: Self-pay | Admitting: Nurse Practitioner

## 2020-04-03 DIAGNOSIS — M79604 Pain in right leg: Secondary | ICD-10-CM | POA: Diagnosis not present

## 2020-04-03 DIAGNOSIS — M545 Low back pain, unspecified: Secondary | ICD-10-CM | POA: Diagnosis not present

## 2020-04-03 DIAGNOSIS — T8484XA Pain due to internal orthopedic prosthetic devices, implants and grafts, initial encounter: Secondary | ICD-10-CM | POA: Diagnosis not present

## 2020-04-03 DIAGNOSIS — Z96652 Presence of left artificial knee joint: Secondary | ICD-10-CM | POA: Diagnosis not present

## 2020-04-03 MED FILL — HYDROCODON-APAP 5-325: 5-325 | 30 days supply | Qty: 60 | Fill #0

## 2020-04-03 MED FILL — NARCAN 4 MG NASAL SPRAY: 4 | 15 days supply | Qty: 2 | Fill #0

## 2020-04-03 MED FILL — BACLOFEN 10 MG TABS: 10 | 15 days supply | Qty: 30 | Fill #0

## 2020-04-03 MED FILL — traMADol HCL 50 MG TABS: 50 | 30 days supply | Qty: 30 | Fill #0

## 2020-04-06 ENCOUNTER — Ambulatory Visit (INDEPENDENT_AMBULATORY_CARE_PROVIDER_SITE_OTHER): Payer: 59 | Admitting: Adult Health

## 2020-04-06 ENCOUNTER — Encounter (INDEPENDENT_AMBULATORY_CARE_PROVIDER_SITE_OTHER): Payer: Self-pay | Admitting: Adult Health

## 2020-04-06 ENCOUNTER — Ambulatory Visit (INDEPENDENT_AMBULATORY_CARE_PROVIDER_SITE_OTHER): Payer: 59 | Admitting: Family Medicine

## 2020-04-06 ENCOUNTER — Other Ambulatory Visit: Payer: Self-pay

## 2020-04-06 VITALS — BP 145/80 | HR 85 | Temp 98.3°F | Ht 61.0 in | Wt 163.0 lb

## 2020-04-06 DIAGNOSIS — R7303 Prediabetes: Secondary | ICD-10-CM | POA: Diagnosis not present

## 2020-04-06 DIAGNOSIS — M25562 Pain in left knee: Secondary | ICD-10-CM

## 2020-04-06 DIAGNOSIS — E782 Mixed hyperlipidemia: Secondary | ICD-10-CM

## 2020-04-06 DIAGNOSIS — G8929 Other chronic pain: Secondary | ICD-10-CM

## 2020-04-06 DIAGNOSIS — I10 Essential (primary) hypertension: Secondary | ICD-10-CM | POA: Diagnosis not present

## 2020-04-06 DIAGNOSIS — R7401 Elevation of levels of liver transaminase levels: Secondary | ICD-10-CM

## 2020-04-06 DIAGNOSIS — M25561 Pain in right knee: Secondary | ICD-10-CM

## 2020-04-06 DIAGNOSIS — Z6830 Body mass index (BMI) 30.0-30.9, adult: Secondary | ICD-10-CM | POA: Diagnosis not present

## 2020-04-06 DIAGNOSIS — E669 Obesity, unspecified: Secondary | ICD-10-CM | POA: Diagnosis not present

## 2020-04-07 DIAGNOSIS — R7401 Elevation of levels of liver transaminase levels: Secondary | ICD-10-CM | POA: Insufficient documentation

## 2020-04-07 NOTE — Progress Notes (Signed)
Chief Complaint:   OBESITY Katherine Tanner is here to discuss her progress with her obesity treatment plan along with follow-up of her obesity related diagnoses. [name] is on the Category 2 Plan and states she is following her eating plan approximately 100% of the time. [name] states she is exercising 0 minutes 0 times per week.  Today's visit was #: 28 Starting weight: 198 lbs Starting date: 08/30/2018 Today's weight: 163 lbs Today's date: 04/06/2020 Total lbs lost to date: 35 Total lbs lost since last in-office visit: 2  Interim History: Astella will average greater than 6,000 steps a day when working in the [employer]. She continues to experience bilateral knee pain and is followed by [facility]. She will alternate between the Category 2 meal plan and the lower carbohydrate meal plan. She denies polyphagia or excessive cravings.  Subjective:   Prediabetes. [name] has a diagnosis of prediabetes based on her elevated HgA1c and was informed this puts her at greater risk of developing diabetes. She continues to work on diet and exercise to decrease her risk of diabetes. She denies nausea or hypoglycemia. 03/23/2020 blood glucose 96, A1c 5.7 with an insulin level of 11.2. A1c and insulin levels are both elevated. 03/23/2020 CMP showed a GFR of greater than 60. Katherine Tanner is on metformin 500 mg daily. Labs were discussed with the patient today.   Lab Results  Component Value Date   HGBA1C 5.7 (H) 03/23/2020   Lab Results  Component Value Date   INSULIN 11.2 03/23/2020   INSULIN 6.2 12/24/2019   INSULIN 4.0 09/11/2019   INSULIN 9.8 05/06/2019   INSULIN 8.8 01/14/2019   Mixed hyperlipidemia. 03/23/2020 total cholesterol 226, HDL 71 (excellent), triglycerides 59, and LDL 145. Total and LDL cholesterol levels are both elevated from last check. ASCVD risk is 5.5%. [name]'s PCP started her on rosuvastatin 10 mg daily. Labs were discussed with the patient  today.   Lab Results  Component Value Date   CHOL 226 (H) 03/23/2020   HDL 71 03/23/2020   LDLCALC 145 (H) 03/23/2020   LDLDIRECT 147.9 12/17/2012   TRIG 59 03/23/2020   CHOLHDL 3.2 03/23/2020   Lab Results  Component Value Date   ALT 49 (H) 03/23/2020   AST 26 03/23/2020   ALKPHOS 157 (H) 03/23/2020   BILITOT 0.3 03/23/2020   The 10-year ASCVD risk score [name], et al., 2013) is: 7.6%   Values used to calculate the score:     Age: 59 years     Sex: Female     Is Non-Hispanic African American: Yes     Diabetic: No     Tobacco smoker: No     Systolic Blood Pressure: 546 mmHg     Is BP treated: Yes     HDL Cholesterol: 71 mg/dL     Total Cholesterol: 226 mg/dL  Essential hypertension. Blood pressure is slightly elevated at today's office visit. Katherine Tanner is on losartan/HCTZ 100/25 mg daily. She reports taking anti-hypertensive just prior to her office visit today. Labs were discussed with the patient today.   BP Readings from Last 3 Encounters:  04/06/20 (!) 145/80  03/23/20 131/75  03/04/20 134/80   Lab Results  Component Value Date   CREATININE 0.52 (L) 03/23/2020   CREATININE 0.59 12/24/2019   CREATININE 0.64 07/08/2019   Transaminitis. 03/23/2020 AST within normal limits, ALT 49 (upp from 39 on 12/24/2019). Channelle has been taking acetaminophen regularly to treat chronic pain. Labs were  discussed with the patient today.   Bilateral chronic knee pain. [name] discontinued gabapentin and amitriptyline. [name] was started on baclofen 10 mg BID PRN and hydrocodone 5/325 mg BID PRN (is only taking one tablet daily).  Assessment/Plan:   Prediabetes. [name] will continue to work on weight loss, exercise, increasing protein, and reducing simple carbohydrates to help decrease the risk of diabetes. She will continue metformin as directed.   Mixed hyperlipidemia. Cardiovascular risk and specific lipid/LDL goals reviewed.  We discussed several  lifestyle modifications today and Katherine Tanner will continue to work on diet, exercise and weight loss efforts. Orders and follow up as documented in patient record. Katherine Tanner will remain well hydrated and continue statin therapy as directed. Her PCP will check lipid panel and manage.  Counseling Intensive lifestyle modifications are the first line treatment for this issue. . Dietary changes: Increase soluble fiber. Decrease simple carbohydrates. . Exercise changes: Moderate to vigorous-intensity aerobic activity 150 minutes per week if tolerated. . Lipid-lowering medications: see documented in medical record.  Essential hypertension. [name] is working on healthy weight loss and exercise to improve blood pressure control. We will watch for signs of hypotension as she continues her lifestyle modifications. She will continue her anti-hypertensive therapy as directed.   Transaminitis. [name] will limit acetaminophen and ETOH use.  Bilateral chronic knee pain. PDMP reviewed. Katherine Tanner will continue with Pain Management as directed. We recommend inquiring about PT/aquatic therapy to augment oral pain management.  Class 1 obesity with serious comorbidity and body mass index (BMI) of 30.0 to 30.9 in adult, unspecified obesity type - BMI greater than 30 at the start of program.  Katherine Tanner is currently in the action stage of change. As such, her goal is to continue with weight loss efforts. She has agreed to the Category 2 Plan and will journal 200-300 calories and 20 grams of protein at breakfast.   Exercise goals: No exercise has been prescribed at this time.  Behavioral modification strategies: increasing lean protein intake, decreasing simple carbohydrates, increasing vegetables, meal planning and cooking strategies, better snacking choices and planning for success.  Katherine Tanner has agreed to follow-up with our clinic in 2-3 weeks. She was informed of the importance of frequent follow-up visits to maximize her success  with intensive lifestyle modifications for her multiple health conditions.   Objective:   Blood pressure (!) 145/80, pulse 85, temperature 98.3 F (36.8 C), height 5\' 1"  (1.549 m), weight 163 lb (73.9 kg), last menstrual period 02/20/2014, SpO2 99 %. Body mass index is 30.8 kg/m.  General: Cooperative, alert, well developed, in no acute distress. HEENT: Conjunctivae and lids unremarkable. Cardiovascular: Regular rhythm.  Lungs: Normal work of breathing. Neurologic: No focal deficits.   Lab Results  Component Value Date   CREATININE 0.52 (L) 03/23/2020   BUN 15 03/23/2020   NA 144 03/23/2020   K 4.5 03/23/2020   CL 102 03/23/2020   CO2 28 03/23/2020   Lab Results  Component Value Date   ALT 49 (H) 03/23/2020   AST 26 03/23/2020   ALKPHOS 157 (H) 03/23/2020   BILITOT 0.3 03/23/2020   Lab Results  Component Value Date   HGBA1C 5.7 (H) 03/23/2020   HGBA1C 5.6 12/24/2019   HGBA1C 5.5 09/11/2019   HGBA1C 5.4 05/06/2019   HGBA1C 5.9 (H) 01/14/2019   Lab Results  Component Value Date   INSULIN 11.2 03/23/2020   INSULIN 6.2 12/24/2019   INSULIN 4.0 09/11/2019   INSULIN 9.8 05/06/2019   INSULIN 8.8 01/14/2019  Lab Results  Component Value Date   TSH 0.84 07/08/2019   Lab Results  Component Value Date   CHOL 226 (H) 03/23/2020   HDL 71 03/23/2020   LDLCALC 145 (H) 03/23/2020   LDLDIRECT 147.9 12/17/2012   TRIG 59 03/23/2020   CHOLHDL 3.2 03/23/2020   Lab Results  Component Value Date   WBC 6.4 12/24/2019   HGB 12.9 12/24/2019   HCT 40.2 12/24/2019   MCV 87 12/24/2019   PLT 135 (L) 12/24/2019   No results found for: IRON, TIBC, FERRITIN  Attestation Statements:   Reviewed by clinician on day of visit: allergies, medications, problem list, medical history, surgical history, family history, social history, and previous encounter notes.  Time spent on visit including pre-visit chart review and post-visit charting and care was 27 minutes.   I, Michaelene Song,  am acting as Location manager for [employer], NP-C   I have reviewed the above documentation for accuracy and completeness, and I agree with the above. -  Esaw Grandchild, NP

## 2020-04-19 MED FILL — LOSARTAN-HCTZ 100-25 MG TAB: 100-25 | 30 days supply | Qty: 30 | Fill #8

## 2020-04-29 ENCOUNTER — Other Ambulatory Visit: Payer: Self-pay

## 2020-04-29 ENCOUNTER — Encounter (INDEPENDENT_AMBULATORY_CARE_PROVIDER_SITE_OTHER): Payer: Self-pay | Admitting: Adult Health

## 2020-04-29 ENCOUNTER — Ambulatory Visit (INDEPENDENT_AMBULATORY_CARE_PROVIDER_SITE_OTHER): Payer: 59 | Admitting: Adult Health

## 2020-04-29 ENCOUNTER — Other Ambulatory Visit (INDEPENDENT_AMBULATORY_CARE_PROVIDER_SITE_OTHER): Payer: Self-pay | Admitting: Adult Health

## 2020-04-29 VITALS — BP 132/71 | HR 73 | Temp 98.2°F | Ht 61.0 in | Wt 165.0 lb

## 2020-04-29 DIAGNOSIS — Z6831 Body mass index (BMI) 31.0-31.9, adult: Secondary | ICD-10-CM

## 2020-04-29 DIAGNOSIS — E669 Obesity, unspecified: Secondary | ICD-10-CM | POA: Diagnosis not present

## 2020-04-29 DIAGNOSIS — R7303 Prediabetes: Secondary | ICD-10-CM | POA: Diagnosis not present

## 2020-04-29 DIAGNOSIS — Z9189 Other specified personal risk factors, not elsewhere classified: Secondary | ICD-10-CM | POA: Diagnosis not present

## 2020-04-29 DIAGNOSIS — I10 Essential (primary) hypertension: Secondary | ICD-10-CM

## 2020-04-29 MED ORDER — METFORMIN HCL 500 MG PO TABS: 500.0000 mg | ORAL_TABLET | Freq: Every day | ORAL | 0 refills | Status: DC

## 2020-04-29 MED FILL — METFORMIN HCL 500 MG TABS: 500 | 30 days supply | Qty: 30 | Fill #0

## 2020-04-30 MED FILL — MONTELUKAST SOD 10 MG TAB: 10 | 90 days supply | Qty: 90 | Fill #0

## 2020-04-30 NOTE — Progress Notes (Signed)
Chief Complaint:   OBESITY Katherine Tanner is here to discuss her progress with her obesity treatment plan along with follow-up of her obesity related diagnoses. Katherine Tanner is on the Category 2 Plan and journaling and states she is following her eating plan approximately 100% of the time. Katherine Tanner states she is biking 20-25 minutes 3 times per week.  Today's visit was #: 57 Starting weight: 198 lbs Starting date: 08/30/2018 Today's weight: 165 lbs Today's date: 04/29/2020 Total lbs lost to date: 33 Total lbs lost since last in-office visit: 0  Interim History: Rozalia will return to Pain Management this Monday, 05/04/2020. She was recently taken off gabapentin.  She feels that she is drinking less water with the change in season. The bioimpedance shows an increase in water weight since her last weigh in.   Subjective:   Prediabetes. Katherine Tanner has a diagnosis of prediabetes based on her elevated HgA1c and was informed this puts her at greater risk of developing diabetes. She continues to work on diet and exercise to decrease her risk of diabetes. She denies nausea or hypoglycemia. 03/23/2020 blood glucose 96, A1c 5.7 with an insulin level of 11.2. CMP on 03/23/2020 showed GFR greater than 60. Katherine Tanner reports tolerating metformin 500 mg daily.  Lab Results  Component Value Date   HGBA1C 5.7 (H) 03/23/2020   Lab Results  Component Value Date   INSULIN 11.2 03/23/2020   INSULIN 6.2 12/24/2019   INSULIN 4.0 09/11/2019   INSULIN 9.8 05/06/2019   INSULIN 8.8 01/14/2019   Essential hypertension. Blood pressure and heart rate are stable on today's office visit. Katherine Tanner is on losartan 25 mg daily. She denies cardiac symptoms.  BP Readings from Last 3 Encounters:  04/29/20 132/71  04/06/20 (!) 145/80  03/23/20 131/75   Lab Results  Component Value Date   CREATININE 0.52 (L) 03/23/2020   CREATININE 0.59 12/24/2019   CREATININE 0.64 07/08/2019   At risk for heart disease. Katherine Tanner  is at a higher than average risk for cardiovascular disease due to hyperlipidemia and prediabetes.   Assessment/Plan:   Prediabetes. Lashawnta will continue to work on weight loss, exercise, and decreasing simple carbohydrates to help decrease the risk of diabetes. Refill was given for metFORMIN (GLUCOPHAGE) 500 MG tablet daily #30 with 0 refills.  Essential hypertension. Katherine Tanner is working on healthy weight loss and exercise to improve blood pressure control. We will watch for signs of hypotension as she continues her lifestyle modifications. She will continue her current antihypertensive regimen as directed and will continue to follow the Category 2 meal plan.  At risk for heart disease. Katherine Tanner was given approximately 15 minutes of coronary artery disease prevention counseling today. She is 59 y.o. female and has risk factors for heart disease including obesity. We discussed intensive lifestyle modifications today with an emphasis on specific weight loss instructions and strategies.   Repetitive spaced learning was employed today to elicit superior memory formation and behavioral change.  Class 1 obesity with serious comorbidity and body mass index (BMI) of 31.0 to 31.9 in adult, unspecified obesity type.  Katherine Tanner is currently in the action stage of change. As such, her goal is to continue with weight loss efforts. She has agreed to the Category 2 Plan.   Exercise goals: Katherine Tanner will continue biking 20-25 minutes 3 times per week.  Behavioral modification strategies: increasing lean protein intake, decreasing simple carbohydrates, increasing water intake, meal planning and cooking strategies and planning for success.  Katherine Tanner has agreed to follow-up  with our clinic in 2-3 weeks. She was informed of the importance of frequent follow-up visits to maximize her success with intensive lifestyle modifications for her multiple health conditions.   Objective:   Blood pressure 132/71, pulse 73, temperature  98.2 F (36.8 C), height 5\' 1"  (1.549 m), weight 165 lb (74.8 kg), last menstrual period 02/20/2014, SpO2 100 %. Body mass index is 31.18 kg/m.  General: Cooperative, alert, well developed, in no acute distress. HEENT: Conjunctivae and lids unremarkable. Cardiovascular: Regular rhythm.  Lungs: Normal work of breathing. Neurologic: No focal deficits.   Lab Results  Component Value Date   CREATININE 0.52 (L) 03/23/2020   BUN 15 03/23/2020   NA 144 03/23/2020   K 4.5 03/23/2020   CL 102 03/23/2020   CO2 28 03/23/2020   Lab Results  Component Value Date   ALT 49 (H) 03/23/2020   AST 26 03/23/2020   ALKPHOS 157 (H) 03/23/2020   BILITOT 0.3 03/23/2020   Lab Results  Component Value Date   HGBA1C 5.7 (H) 03/23/2020   HGBA1C 5.6 12/24/2019   HGBA1C 5.5 09/11/2019   HGBA1C 5.4 05/06/2019   HGBA1C 5.9 (H) 01/14/2019   Lab Results  Component Value Date   INSULIN 11.2 03/23/2020   INSULIN 6.2 12/24/2019   INSULIN 4.0 09/11/2019   INSULIN 9.8 05/06/2019   INSULIN 8.8 01/14/2019   Lab Results  Component Value Date   TSH 0.84 07/08/2019   Lab Results  Component Value Date   CHOL 226 (H) 03/23/2020   HDL 71 03/23/2020   LDLCALC 145 (H) 03/23/2020   LDLDIRECT 147.9 12/17/2012   TRIG 59 03/23/2020   CHOLHDL 3.2 03/23/2020   Lab Results  Component Value Date   WBC 6.4 12/24/2019   HGB 12.9 12/24/2019   HCT 40.2 12/24/2019   MCV 87 12/24/2019   PLT 135 (L) 12/24/2019   No results found for: IRON, TIBC, FERRITIN  Attestation Statements:   Reviewed by clinician on day of visit: allergies, medications, problem list, medical history, surgical history, family history, social history, and previous encounter notes.  I, Michaelene Song, am acting as Location manager for [employer], NP-C   I have reviewed the above documentation for accuracy and completeness, and I agree with the above. -  Torri Michalski d. Jina Olenick, NP-C

## 2020-05-01 ENCOUNTER — Other Ambulatory Visit (HOSPITAL_COMMUNITY): Payer: Self-pay | Admitting: Nurse Practitioner

## 2020-05-01 DIAGNOSIS — Z79899 Other long term (current) drug therapy: Secondary | ICD-10-CM | POA: Diagnosis not present

## 2020-05-01 DIAGNOSIS — Z96651 Presence of right artificial knee joint: Secondary | ICD-10-CM | POA: Diagnosis not present

## 2020-05-01 DIAGNOSIS — G894 Chronic pain syndrome: Secondary | ICD-10-CM | POA: Diagnosis not present

## 2020-05-01 DIAGNOSIS — T8484XA Pain due to internal orthopedic prosthetic devices, implants and grafts, initial encounter: Secondary | ICD-10-CM | POA: Diagnosis not present

## 2020-05-01 MED FILL — traMADol HCL 50 MG TABS: 50 | 30 days supply | Qty: 30 | Fill #0

## 2020-05-01 MED FILL — BACLOFEN 10 MG TABS: 10 | 30 days supply | Qty: 60 | Fill #0

## 2020-05-01 MED FILL — DULoxetine HCL 30 MG CPEP: 30 | 30 days supply | Qty: 30 | Fill #0

## 2020-05-02 MED FILL — HYDROCODON-APAP 5-325: 5-325 | 30 days supply | Qty: 60 | Fill #0

## 2020-05-04 ENCOUNTER — Encounter (INDEPENDENT_AMBULATORY_CARE_PROVIDER_SITE_OTHER): Payer: Self-pay | Admitting: Adult Health

## 2020-05-04 NOTE — Telephone Encounter (Signed)
Please advise 

## 2020-05-04 NOTE — Telephone Encounter (Signed)
Katy pt

## 2020-05-05 NOTE — Telephone Encounter (Signed)
FYI

## 2020-05-11 ENCOUNTER — Other Ambulatory Visit: Payer: Self-pay

## 2020-05-11 ENCOUNTER — Other Ambulatory Visit (INDEPENDENT_AMBULATORY_CARE_PROVIDER_SITE_OTHER): Payer: 59

## 2020-05-11 DIAGNOSIS — E7800 Pure hypercholesterolemia, unspecified: Secondary | ICD-10-CM | POA: Diagnosis not present

## 2020-05-11 LAB — LIPID PANEL
Cholesterol: 132 mg/dL (ref 0–200)
HDL: 56.4 mg/dL (ref >39.00)
LDL Cholesterol: 68 mg/dL (ref 0–99)
NonHDL: 75.64
Total CHOL/HDL Ratio: 2
Triglycerides: 40 mg/dL (ref 0.0–149.0)
VLDL: 8 mg/dL (ref 0.0–40.0)

## 2020-05-11 LAB — ALT: ALT: 38 U/L — ABNORMAL HIGH (ref 0–35)

## 2020-05-11 LAB — AST: AST: 25 U/L (ref 0–37)

## 2020-05-20 ENCOUNTER — Ambulatory Visit (INDEPENDENT_AMBULATORY_CARE_PROVIDER_SITE_OTHER): Payer: 59 | Admitting: Adult Health

## 2020-05-20 ENCOUNTER — Other Ambulatory Visit (INDEPENDENT_AMBULATORY_CARE_PROVIDER_SITE_OTHER): Payer: Self-pay | Admitting: Adult Health

## 2020-05-20 ENCOUNTER — Encounter (INDEPENDENT_AMBULATORY_CARE_PROVIDER_SITE_OTHER): Payer: Self-pay | Admitting: Adult Health

## 2020-05-20 ENCOUNTER — Other Ambulatory Visit: Payer: Self-pay

## 2020-05-20 VITALS — BP 122/68 | HR 77 | Temp 98.2°F | Ht 61.0 in | Wt 164.0 lb

## 2020-05-20 DIAGNOSIS — Z9189 Other specified personal risk factors, not elsewhere classified: Secondary | ICD-10-CM | POA: Diagnosis not present

## 2020-05-20 DIAGNOSIS — R7303 Prediabetes: Secondary | ICD-10-CM | POA: Diagnosis not present

## 2020-05-20 DIAGNOSIS — E782 Mixed hyperlipidemia: Secondary | ICD-10-CM

## 2020-05-20 DIAGNOSIS — I10 Essential (primary) hypertension: Secondary | ICD-10-CM

## 2020-05-20 DIAGNOSIS — E669 Obesity, unspecified: Secondary | ICD-10-CM | POA: Diagnosis not present

## 2020-05-20 DIAGNOSIS — Z6831 Body mass index (BMI) 31.0-31.9, adult: Secondary | ICD-10-CM | POA: Diagnosis not present

## 2020-05-20 MED ORDER — METFORMIN HCL 500 MG PO TABS: 500.0000 mg | ORAL_TABLET | Freq: Every day | ORAL | 0 refills | Status: DC

## 2020-05-21 NOTE — Progress Notes (Signed)
Chief Complaint:   OBESITY Katherine Tanner is here to discuss her progress with her obesity treatment plan along with follow-up of her obesity related diagnoses. Katherine Tanner is on the Category 2 Plan and states she is following her eating plan approximately 100% of the time. Katherine Tanner states she is biking and doing yoga for 20 minutes 3-4 times per week.  Today's visit was #: 43 Starting weight: 198 lbs Starting date: 08/30/2018 Today's weight: 164 lbs Today's date: 05/20/2020 Total lbs lost to date: 34 lbs Total lbs lost since last in-office visit: 1 lb  Interim History: Katherine Tanner continues to take 2 days off on PAL per month, which has helped boost mood and reduce overall stress levels.  She has added in chair yoga to exercise regimen.  She continues to enjoy foods and structure of the Category 2 meal plan.  She continues to steadily lose weight.  Subjective:   1. Prediabetes Katherine Tanner has a diagnosis of prediabetes based on her elevated HgA1c and was informed this puts her at greater risk of developing diabetes. She continues to work on diet and exercise to decrease her risk of diabetes. She denies nausea or hypoglycemia.  On 03/23/2020, BG 96, A1c 5.7, insulin 11.2.  A1c improved with worsening insulin level.  She is on metformin 500 mg daily.  On 03/23/2020, CMP showed a GFR of 123.  Lab Results  Component Value Date   HGBA1C 5.7 (H) 03/23/2020   Lab Results  Component Value Date   INSULIN 11.2 03/23/2020   INSULIN 6.2 12/24/2019   INSULIN 4.0 09/11/2019   INSULIN 9.8 05/06/2019   INSULIN 8.8 01/14/2019   2. Essential hypertension Review: taking medications as instructed, no medication side effects noted, no chest pain on exertion, no dyspnea on exertion, no swelling of ankles.  BP/HR is excellent in the office today.  She is on losartan/HCTZ 100/25 mg daily.  BP Readings from Last 3 Encounters:  05/20/20 122/68  04/29/20 132/71  04/06/20 (!) 145/80   3. Mixed hyperlipidemia Katherine Tanner has  hyperlipidemia and has been trying to improve her cholesterol levels with intensive lifestyle modification including a low saturated fat diet, exercise and weight loss. She denies any chest pain, claudication or myalgias.  On 05/11/2020, lipid panel showed dramatic improvement of total LDL cholesterol.  She is on rosuvastatin 10 mg daily.  Denies myalgias.  Lab Results  Component Value Date   ALT 38 (H) 05/11/2020   AST 25 05/11/2020   ALKPHOS 157 (H) 03/23/2020   BILITOT 0.3 03/23/2020   Lab Results  Component Value Date   CHOL 132 05/11/2020   HDL 56.40 05/11/2020   LDLCALC 68 05/11/2020   LDLDIRECT 147.9 12/17/2012   TRIG 40.0 05/11/2020   CHOLHDL 2 05/11/2020   4. At risk for diabetes mellitus Katherine Tanner is at higher than average risk for developing diabetes due to obesity.   Assessment/Plan:   1. Prediabetes Katherine Tanner will continue to work on weight loss, exercise, and decreasing simple carbohydrates to help decrease the risk of diabetes.  Will refill metformin today, as per below.  -Refill metFORMIN (GLUCOPHAGE) 500 MG tablet; Take 1 tablet (500 mg total) by mouth daily.  Dispense: 30 tablet; Refill: 0  2. Essential hypertension Katherine Tanner is working on healthy weight loss and exercise to improve blood pressure control. We will watch for signs of hypotension as she continues her lifestyle modifications.  Continue losartan/HCTZ combo.  3. Mixed hyperlipidemia Cardiovascular risk and specific lipid/LDL goals reviewed.  We discussed several  lifestyle modifications today and Katherine Tanner will continue to work on diet, exercise and weight loss efforts. Orders and follow up as documented in patient record.  Continue rosuvastatin 10 mg daily.  Continue Category 2 meal plan and regular exercise.   Counseling Intensive lifestyle modifications are the first line treatment for this issue. . Dietary changes: Increase soluble fiber. Decrease simple carbohydrates. . Exercise changes: Moderate to  vigorous-intensity aerobic activity 150 minutes per week if tolerated. . Lipid-lowering medications: see documented in medical record.  4. At risk for diabetes mellitus Katherine Tanner is at risk for diabetes due to obesity and prediabetes.  She was given approximately 15 minutes of diabetes education and counseling today. We discussed intensive lifestyle modifications today with an emphasis on weight loss as well as increasing exercise and decreasing simple carbohydrates in her diet. We also reviewed medication options with an emphasis on risk versus benefit of those discussed.   Repetitive spaced learning was employed today to elicit superior memory formation and behavioral change.  5. Class 1 obesity with serious comorbidity and body mass index (BMI) of 31.0 to 31.9 in adult, unspecified obesity type  Katherine Tanner is currently in the action stage of change. As such, her goal is to continue with weight loss efforts. She has agreed to the Category 2 Plan.   Exercise goals: As is.  Behavioral modification strategies: increasing lean protein intake, meal planning and cooking strategies, holiday eating strategies  and planning for success.  Handout given:  Thanksgiving sheet/[employer]  Katherine Tanner has agreed to follow-up with our clinic in 3 weeks. She was informed of the importance of frequent follow-up visits to maximize her success with intensive lifestyle modifications for her multiple health conditions.   Objective:   Blood pressure 122/68, pulse 77, temperature 98.2 F (36.8 C), height 5\' 1"  (1.549 m), weight 164 lb (74.4 kg), last menstrual period 02/20/2014, SpO2 100 %. Body mass index is 30.99 kg/m.  General: Cooperative, alert, well developed, in no acute distress. HEENT: Conjunctivae and lids unremarkable. Cardiovascular: Regular rhythm.  Lungs: Normal work of breathing. Neurologic: No focal deficits.   Lab Results  Component Value Date   CREATININE 0.52 (L) 03/23/2020   BUN 15  03/23/2020   NA 144 03/23/2020   K 4.5 03/23/2020   CL 102 03/23/2020   CO2 28 03/23/2020   Lab Results  Component Value Date   ALT 38 (H) 05/11/2020   AST 25 05/11/2020   ALKPHOS 157 (H) 03/23/2020   BILITOT 0.3 03/23/2020   Lab Results  Component Value Date   HGBA1C 5.7 (H) 03/23/2020   HGBA1C 5.6 12/24/2019   HGBA1C 5.5 09/11/2019   HGBA1C 5.4 05/06/2019   HGBA1C 5.9 (H) 01/14/2019   Lab Results  Component Value Date   INSULIN 11.2 03/23/2020   INSULIN 6.2 12/24/2019   INSULIN 4.0 09/11/2019   INSULIN 9.8 05/06/2019   INSULIN 8.8 01/14/2019   Lab Results  Component Value Date   TSH 0.84 07/08/2019   Lab Results  Component Value Date   CHOL 132 05/11/2020   HDL 56.40 05/11/2020   LDLCALC 68 05/11/2020   LDLDIRECT 147.9 12/17/2012   TRIG 40.0 05/11/2020   CHOLHDL 2 05/11/2020   Lab Results  Component Value Date   WBC 6.4 12/24/2019   HGB 12.9 12/24/2019   HCT 40.2 12/24/2019   MCV 87 12/24/2019   PLT 135 (L) 12/24/2019   Attestation Statements:   Reviewed by clinician on day of visit: allergies, medications, problem list, medical  history, surgical history, family history, social history, and previous encounter notes.  I, Water quality scientist, CMA, am acting as Location manager for Mina Marble, NP.  I have reviewed the above documentation for accuracy and completeness, and I agree with the above. -  Dreya Buhrman d. Edrei Norgaard, NP-C

## 2020-05-22 DIAGNOSIS — N841 Polyp of cervix uteri: Secondary | ICD-10-CM | POA: Diagnosis not present

## 2020-05-22 DIAGNOSIS — Z01419 Encounter for gynecological examination (general) (routine) without abnormal findings: Secondary | ICD-10-CM | POA: Diagnosis not present

## 2020-05-23 MED FILL — METFORMIN HCL 500 MG TABS: 500 | 30 days supply | Qty: 30 | Fill #0

## 2020-05-24 MED FILL — ALBUTEROL SULFATE HFA 108 (: 108 (90 BAS | 25 days supply | Qty: 18 | Fill #1

## 2020-05-25 ENCOUNTER — Other Ambulatory Visit: Payer: Self-pay | Admitting: Internal Medicine

## 2020-05-25 MED ORDER — FLUTICASONE-SALMETEROL 250-50 MCG/DOSE IN AEPB
INHALATION_SPRAY | RESPIRATORY_TRACT | 12 refills | Status: DC
Start: 2020-05-25 — End: 2020-09-10

## 2020-05-25 MED FILL — ADVAIR 250/50 DISKUS: 250-50 | 30 days supply | Qty: 60 | Fill #0

## 2020-06-01 ENCOUNTER — Other Ambulatory Visit (HOSPITAL_COMMUNITY): Payer: Self-pay | Admitting: Nurse Practitioner

## 2020-06-01 DIAGNOSIS — Z96652 Presence of left artificial knee joint: Secondary | ICD-10-CM | POA: Diagnosis not present

## 2020-06-01 DIAGNOSIS — G894 Chronic pain syndrome: Secondary | ICD-10-CM | POA: Diagnosis not present

## 2020-06-01 DIAGNOSIS — T8484XA Pain due to internal orthopedic prosthetic devices, implants and grafts, initial encounter: Secondary | ICD-10-CM | POA: Diagnosis not present

## 2020-06-01 DIAGNOSIS — Z79899 Other long term (current) drug therapy: Secondary | ICD-10-CM | POA: Diagnosis not present

## 2020-06-01 DIAGNOSIS — T8484XS Pain due to internal orthopedic prosthetic devices, implants and grafts, sequela: Secondary | ICD-10-CM | POA: Diagnosis not present

## 2020-06-01 MED FILL — BACLOFEN 10 MG TABS: 10 | 30 days supply | Qty: 60 | Fill #0

## 2020-06-01 MED FILL — DULoxetine HCL 30 MG CPEP: 30 | 30 days supply | Qty: 60 | Fill #0

## 2020-06-02 ENCOUNTER — Other Ambulatory Visit (HOSPITAL_COMMUNITY): Payer: Self-pay | Admitting: Nurse Practitioner

## 2020-06-02 MED FILL — HYDROCODON-APAP 5-325: 5-325 | 15 days supply | Qty: 15 | Fill #0

## 2020-06-02 MED FILL — traMADol HCL 50 MG TABS: 50 | 30 days supply | Qty: 30 | Fill #0

## 2020-06-08 ENCOUNTER — Other Ambulatory Visit (HOSPITAL_COMMUNITY): Payer: Self-pay | Admitting: Physical Medicine and Rehabilitation

## 2020-06-08 ENCOUNTER — Ambulatory Visit (INDEPENDENT_AMBULATORY_CARE_PROVIDER_SITE_OTHER): Payer: 59 | Admitting: Adult Health

## 2020-06-08 DIAGNOSIS — G4700 Insomnia, unspecified: Secondary | ICD-10-CM | POA: Diagnosis not present

## 2020-06-08 DIAGNOSIS — Z79891 Long term (current) use of opiate analgesic: Secondary | ICD-10-CM | POA: Diagnosis not present

## 2020-06-08 DIAGNOSIS — M7552 Bursitis of left shoulder: Secondary | ICD-10-CM | POA: Diagnosis not present

## 2020-06-08 DIAGNOSIS — G894 Chronic pain syndrome: Secondary | ICD-10-CM | POA: Diagnosis not present

## 2020-06-08 DIAGNOSIS — M25569 Pain in unspecified knee: Secondary | ICD-10-CM | POA: Diagnosis not present

## 2020-06-09 ENCOUNTER — Other Ambulatory Visit: Payer: Self-pay | Admitting: Physical Medicine and Rehabilitation

## 2020-06-09 ENCOUNTER — Ambulatory Visit
Admission: RE | Admit: 2020-06-09 | Discharge: 2020-06-09 | Disposition: A | Discharge status: Home / Self Care | Payer: 59 | Source: Ambulatory Visit | Attending: Physical Medicine and Rehabilitation | Admitting: Physical Medicine and Rehabilitation

## 2020-06-09 DIAGNOSIS — M5410 Radiculopathy, site unspecified: Secondary | ICD-10-CM

## 2020-06-09 DIAGNOSIS — M545 Low back pain, unspecified: Secondary | ICD-10-CM | POA: Diagnosis not present

## 2020-06-15 ENCOUNTER — Other Ambulatory Visit (INDEPENDENT_AMBULATORY_CARE_PROVIDER_SITE_OTHER): Payer: Self-pay | Admitting: Adult Health

## 2020-06-15 ENCOUNTER — Encounter (INDEPENDENT_AMBULATORY_CARE_PROVIDER_SITE_OTHER): Payer: Self-pay | Admitting: Adult Health

## 2020-06-15 DIAGNOSIS — R7303 Prediabetes: Secondary | ICD-10-CM

## 2020-06-15 MED ORDER — METFORMIN HCL 500 MG PO TABS: 500.0000 mg | ORAL_TABLET | Freq: Every day | ORAL | 0 refills | Status: DC

## 2020-06-15 MED FILL — LOSARTAN-HCTZ 100-25 MG TAB: 100-25 | 30 days supply | Qty: 30 | Fill #9

## 2020-06-15 NOTE — Telephone Encounter (Signed)
Refill request

## 2020-06-22 MED FILL — ROSUVASTATIN CALCIUM 10 MG: 10 | 90 days supply | Qty: 90 | Fill #1

## 2020-06-22 MED FILL — traMADol HCL 50 MG TABS: 50 | 23 days supply | Qty: 90 | Fill #0

## 2020-06-25 MED FILL — DULoxetine HCL 30 MG CPEP: 30 | 30 days supply | Qty: 60 | Fill #0

## 2020-07-02 MED FILL — METFORMIN HCL 500 MG TABS: 500 | 30 days supply | Qty: 30 | Fill #0

## 2020-07-06 ENCOUNTER — Other Ambulatory Visit (INDEPENDENT_AMBULATORY_CARE_PROVIDER_SITE_OTHER): Payer: Self-pay | Admitting: Family Medicine

## 2020-07-06 ENCOUNTER — Other Ambulatory Visit: Payer: Self-pay

## 2020-07-06 ENCOUNTER — Ambulatory Visit (INDEPENDENT_AMBULATORY_CARE_PROVIDER_SITE_OTHER): Payer: 59 | Admitting: Adult Health

## 2020-07-06 ENCOUNTER — Telehealth: Payer: Self-pay | Admitting: Family Medicine

## 2020-07-06 ENCOUNTER — Ambulatory Visit (INDEPENDENT_AMBULATORY_CARE_PROVIDER_SITE_OTHER): Payer: 59 | Admitting: Family Medicine

## 2020-07-06 ENCOUNTER — Encounter (INDEPENDENT_AMBULATORY_CARE_PROVIDER_SITE_OTHER): Payer: Self-pay | Admitting: Family Medicine

## 2020-07-06 VITALS — BP 128/74 | HR 87 | Temp 98.2°F | Ht 61.0 in | Wt 161.0 lb

## 2020-07-06 DIAGNOSIS — I10 Essential (primary) hypertension: Secondary | ICD-10-CM | POA: Diagnosis not present

## 2020-07-06 DIAGNOSIS — R7303 Prediabetes: Secondary | ICD-10-CM

## 2020-07-06 DIAGNOSIS — E7800 Pure hypercholesterolemia, unspecified: Secondary | ICD-10-CM

## 2020-07-06 DIAGNOSIS — E669 Obesity, unspecified: Secondary | ICD-10-CM

## 2020-07-06 DIAGNOSIS — Z6830 Body mass index (BMI) 30.0-30.9, adult: Secondary | ICD-10-CM | POA: Diagnosis not present

## 2020-07-06 DIAGNOSIS — E559 Vitamin D deficiency, unspecified: Secondary | ICD-10-CM

## 2020-07-06 DIAGNOSIS — K760 Fatty (change of) liver, not elsewhere classified: Secondary | ICD-10-CM | POA: Insufficient documentation

## 2020-07-06 DIAGNOSIS — D696 Thrombocytopenia, unspecified: Secondary | ICD-10-CM

## 2020-07-06 DIAGNOSIS — Z9189 Other specified personal risk factors, not elsewhere classified: Secondary | ICD-10-CM

## 2020-07-06 MED ORDER — METFORMIN HCL 500 MG PO TABS: 500.0000 mg | ORAL_TABLET | Freq: Every day | ORAL | 0 refills | Status: DC

## 2020-07-06 NOTE — Telephone Encounter (Signed)
-----   Message from Aquilla Solian, RT sent at 06/23/2020  2:26 PM EST ----- Regarding: Lab Orders for Tuesday 1.4.2022 Please place lab orders for Tuesday 1.4.2022, office visit for physical on Tuesday 1.11.2022 Thank you, Jones Bales RT(R)

## 2020-07-07 ENCOUNTER — Other Ambulatory Visit (INDEPENDENT_AMBULATORY_CARE_PROVIDER_SITE_OTHER): Payer: 59

## 2020-07-07 DIAGNOSIS — E559 Vitamin D deficiency, unspecified: Secondary | ICD-10-CM | POA: Diagnosis not present

## 2020-07-07 DIAGNOSIS — E7800 Pure hypercholesterolemia, unspecified: Secondary | ICD-10-CM

## 2020-07-07 DIAGNOSIS — R7303 Prediabetes: Secondary | ICD-10-CM

## 2020-07-07 DIAGNOSIS — I10 Essential (primary) hypertension: Secondary | ICD-10-CM

## 2020-07-07 DIAGNOSIS — D696 Thrombocytopenia, unspecified: Secondary | ICD-10-CM | POA: Diagnosis not present

## 2020-07-07 LAB — HEMOGLOBIN A1C: Hgb A1c MFr Bld: 5.8 % (ref 4.6–6.5)

## 2020-07-07 LAB — VITAMIN D 25 HYDROXY (VIT D DEFICIENCY, FRACTURES): VITD: 42.01 ng/mL (ref 30.00–100.00)

## 2020-07-07 LAB — CBC WITH DIFFERENTIAL/PLATELET
Basophils Absolute: 0.1 10*3/uL (ref 0.0–0.1)
Basophils Relative: 0.8 % (ref 0.0–3.0)
Eosinophils Absolute: 0.1 10*3/uL (ref 0.0–0.7)
Eosinophils Relative: 1.7 % (ref 0.0–5.0)
HCT: 37.7 % (ref 36.0–46.0)
Hemoglobin: 12.4 g/dL (ref 12.0–15.0)
Lymphocytes Relative: 31.3 % (ref 12.0–46.0)
Lymphs Abs: 2 10*3/uL (ref 0.7–4.0)
MCHC: 33 g/dL (ref 30.0–36.0)
MCV: 87.3 fl (ref 78.0–100.0)
Monocytes Absolute: 0.8 10*3/uL (ref 0.1–1.0)
Monocytes Relative: 11.6 % (ref 3.0–12.0)
Neutro Abs: 3.6 10*3/uL (ref 1.4–7.7)
Neutrophils Relative %: 54.6 % (ref 43.0–77.0)
Platelets: 91 10*3/uL — ABNORMAL LOW (ref 150.0–400.0)
RBC: 4.32 Mil/uL (ref 3.87–5.11)
RDW: 14.2 % (ref 11.5–15.5)
WBC: 6.5 10*3/uL (ref 4.0–10.5)

## 2020-07-07 LAB — COMPREHENSIVE METABOLIC PANEL
ALT: 73 U/L — ABNORMAL HIGH (ref 0–35)
AST: 47 U/L — ABNORMAL HIGH (ref 0–37)
Albumin: 4.4 g/dL (ref 3.5–5.2)
Alkaline Phosphatase: 145 U/L — ABNORMAL HIGH (ref 39–117)
BUN: 20 mg/dL (ref 6–23)
CO2: 34 mEq/L — ABNORMAL HIGH (ref 19–32)
Calcium: 9.5 mg/dL (ref 8.4–10.5)
Chloride: 98 mEq/L (ref 96–112)
Creatinine, Ser: 0.59 mg/dL (ref 0.40–1.20)
GFR: 99.74 mL/min (ref >60.00)
Glucose, Bld: 80 mg/dL (ref 70–99)
Potassium: 4.1 mEq/L (ref 3.5–5.1)
Sodium: 137 mEq/L (ref 135–145)
Total Bilirubin: 0.4 mg/dL (ref 0.2–1.2)
Total Protein: 6.9 g/dL (ref 6.0–8.3)

## 2020-07-07 LAB — LIPID PANEL
Cholesterol: 149 mg/dL (ref 0–200)
HDL: 61.9 mg/dL (ref >39.00)
LDL Cholesterol: 77 mg/dL (ref 0–99)
NonHDL: 87.2
Total CHOL/HDL Ratio: 2
Triglycerides: 53 mg/dL (ref 0.0–149.0)
VLDL: 10.6 mg/dL (ref 0.0–40.0)

## 2020-07-07 LAB — TSH: TSH: 1.32 u[IU]/mL (ref 0.35–4.50)

## 2020-07-07 LAB — IRON: Iron: 63 ug/dL (ref 42–145)

## 2020-07-07 NOTE — Progress Notes (Signed)
Chief Complaint:   OBESITY Katherine Tanner is here to discuss her progress with her obesity treatment plan along with follow-up of her obesity related diagnoses. Katherine Tanner is on the Category 2 Plan with breakfast options and states she is following her eating plan approximately 100% of the time. Katherine Tanner states she is using the stationary bike 30 minutes 3 times per week.  Today's visit was #: 36 Starting weight: 198 lbs Starting date: 08/30/2018 Today's weight: 161 lbs Today's date: 07/06/2020 Total lbs lost to date: 37 lbs Total lbs lost since last in-office visit: 3 lbs Total weight loss percentage to date: -18.69%  Interim History: Katherine Tanner reports that she did well over the holidays. She practiced [facility] over the holidays and had no issues or concerns. She is dealing with chronic pain and is managed by [facility], which limits her activity.   Assessment/Plan:   1. Pre-diabetes Katherine Tanner's last A1c was 5.7 on 03/23/2020. She is prescribed Metformin 500 mg every morning. She denies sweets/carb cravings.  Lab Results  Component Value Date   HGBA1C 5.7 (H) 03/23/2020   Plan: Refill Metformin for 1 month, as per below. Continue weight loss via prudent nutritional plan and exercise.  Refill- metFORMIN (GLUCOPHAGE) 500 MG tablet; Take 1 tablet (500 mg total) by mouth daily.  Dispense: 30 tablet; Refill: 0  2. Essential hypertension [place]ta reports at home blood pressure readings 120's/70's. She has been taking Hyzaar 100-25 mg 1/2 tablet daily for several months. She has no concerns. Review: taking medications as instructed, no medication side effects noted, no chest pain on exertion, no dyspnea on exertion, no swelling of ankles.   BP Readings from Last 3 Encounters:  07/06/20 128/74  05/20/20 122/68  04/29/20 132/71    Plan: Vylet's blood pressure is at goal. Continue medication at current dose. Katherine Tanner is working on healthy weight loss and exercise to improve blood pressure control. We  will watch for signs of hypotension as she continues her lifestyle modifications.  3. Nonalcoholic hepatosteatosis Katherine Tanner has history of slightly elevated ALT  Lab Results  Component Value Date   ALT 38 (H) 05/11/2020   AST 25 05/11/2020   ALKPHOS 157 (H) 03/23/2020   BILITOT 0.3 03/23/2020   Plan: Kyli is having bloodwork done at PCP's office tomorrow morning and will follow up.  4. At risk for heart disease Katherine Tanner was given approximately 15 minutes of coronary artery disease prevention counseling today. She is 60 y.o. female and has risk factors for heart disease including obesity. We discussed intensive lifestyle modifications today with an emphasis on specific weight loss instructions and strategies.   5. Class 1 obesity with serious comorbidity and body mass index (BMI) of 30.0 to 30.9 in adult, unspecified obesity type Katherine Tanner is currently in the action stage of change. As such, her goal is to continue with weight loss efforts. She has agreed to the Category 2 Plan with breakfast options.   Exercise goals: 3 days a week of biking for 30 minutes, plus 30 minutes 2 days a week of yoga or stretching or meditation.  Behavioral modification strategies: meal planning and cooking strategies and planning for success.  Serine has agreed to follow-up with our clinic in 3 weeks. She was informed of the importance of frequent follow-up visits to maximize her success with intensive lifestyle modifications for her multiple health conditions.   Objective:   Blood pressure 128/74, pulse 87, temperature 98.2 F (36.8 C), height 5\' 1"  (1.549 m), weight 161 lb (73  kg), last menstrual period 02/20/2014, SpO2 96 %. Body mass index is 30.42 kg/m.  General: Cooperative, alert, well developed, in no acute distress. HEENT: Conjunctivae and lids unremarkable. Cardiovascular: Regular rhythm.  Lungs: Normal work of breathing. Neurologic: No focal deficits.   Lab Results  Component Value Date    CREATININE 0.52 (L) 03/23/2020   BUN 15 03/23/2020   NA 144 03/23/2020   K 4.5 03/23/2020   CL 102 03/23/2020   CO2 28 03/23/2020   Lab Results  Component Value Date   ALT 38 (H) 05/11/2020   AST 25 05/11/2020   ALKPHOS 157 (H) 03/23/2020   BILITOT 0.3 03/23/2020   Lab Results  Component Value Date   HGBA1C 5.7 (H) 03/23/2020   HGBA1C 5.6 12/24/2019   HGBA1C 5.5 09/11/2019   HGBA1C 5.4 05/06/2019   HGBA1C 5.9 (H) 01/14/2019   Lab Results  Component Value Date   INSULIN 11.2 03/23/2020   INSULIN 6.2 12/24/2019   INSULIN 4.0 09/11/2019   INSULIN 9.8 05/06/2019   INSULIN 8.8 01/14/2019   Lab Results  Component Value Date   TSH 0.84 07/08/2019   Lab Results  Component Value Date   CHOL 132 05/11/2020   HDL 56.40 05/11/2020   LDLCALC 68 05/11/2020   LDLDIRECT 147.9 12/17/2012   TRIG 40.0 05/11/2020   CHOLHDL 2 05/11/2020   Lab Results  Component Value Date   WBC 6.4 12/24/2019   HGB 12.9 12/24/2019   HCT 40.2 12/24/2019   MCV 87 12/24/2019   PLT 135 (L) 12/24/2019    Attestation Statements:   Reviewed by clinician on day of visit: allergies, medications, problem list, medical history, surgical history, family history, social history, and previous encounter notes.  Coral Ceo, am acting as Location manager for [employer], DO.  I have reviewed the above documentation for accuracy and completeness, and I agree with the above. Marjory Sneddon, D.O.  The [place] was signed into law in 2016 which includes the topic of electronic health records.  This provides immediate access to information in MyChart.  This includes consultation notes, operative notes, office notes, lab results and pathology reports.  If you have any questions about what you read please let us know at your next visit so we can discuss your concerns and take corrective action if need be.  We are right here with you.

## 2020-07-08 ENCOUNTER — Other Ambulatory Visit: Payer: Self-pay | Admitting: Physical Medicine and Rehabilitation

## 2020-07-08 ENCOUNTER — Ambulatory Visit
Admission: RE | Admit: 2020-07-08 | Discharge: 2020-07-08 | Disposition: A | Discharge status: Home / Self Care | Payer: 59 | Source: Ambulatory Visit | Attending: Physical Medicine and Rehabilitation | Admitting: Physical Medicine and Rehabilitation

## 2020-07-08 ENCOUNTER — Other Ambulatory Visit (HOSPITAL_COMMUNITY): Payer: Self-pay | Admitting: Physical Medicine and Rehabilitation

## 2020-07-08 DIAGNOSIS — M25569 Pain in unspecified knee: Secondary | ICD-10-CM | POA: Diagnosis not present

## 2020-07-08 DIAGNOSIS — G894 Chronic pain syndrome: Secondary | ICD-10-CM | POA: Diagnosis not present

## 2020-07-08 DIAGNOSIS — M7732 Calcaneal spur, left foot: Secondary | ICD-10-CM | POA: Diagnosis not present

## 2020-07-08 DIAGNOSIS — R52 Pain, unspecified: Secondary | ICD-10-CM

## 2020-07-08 DIAGNOSIS — M7989 Other specified soft tissue disorders: Secondary | ICD-10-CM | POA: Diagnosis not present

## 2020-07-08 DIAGNOSIS — M25572 Pain in left ankle and joints of left foot: Secondary | ICD-10-CM | POA: Diagnosis not present

## 2020-07-08 DIAGNOSIS — G4700 Insomnia, unspecified: Secondary | ICD-10-CM | POA: Diagnosis not present

## 2020-07-08 DIAGNOSIS — M7552 Bursitis of left shoulder: Secondary | ICD-10-CM | POA: Diagnosis not present

## 2020-07-08 MED FILL — BACLOFEN 10 MG TABS: 10 | 30 days supply | Qty: 60 | Fill #0

## 2020-07-10 ENCOUNTER — Other Ambulatory Visit: Payer: Self-pay | Admitting: Physical Medicine and Rehabilitation

## 2020-07-10 DIAGNOSIS — M5416 Radiculopathy, lumbar region: Secondary | ICD-10-CM

## 2020-07-12 ENCOUNTER — Other Ambulatory Visit: Payer: Self-pay

## 2020-07-12 ENCOUNTER — Ambulatory Visit
Admission: RE | Admit: 2020-07-12 | Discharge: 2020-07-12 | Disposition: A | Discharge status: Home / Self Care | Payer: 59 | Source: Ambulatory Visit | Attending: Physical Medicine and Rehabilitation | Admitting: Physical Medicine and Rehabilitation

## 2020-07-12 DIAGNOSIS — M5127 Other intervertebral disc displacement, lumbosacral region: Secondary | ICD-10-CM | POA: Diagnosis not present

## 2020-07-12 DIAGNOSIS — M5416 Radiculopathy, lumbar region: Secondary | ICD-10-CM

## 2020-07-13 MED FILL — traMADol HCL 50 MG TABS: 50 | 22 days supply | Qty: 90 | Fill #0

## 2020-07-14 ENCOUNTER — Encounter: Payer: Self-pay | Admitting: Family Medicine

## 2020-07-14 ENCOUNTER — Ambulatory Visit (INDEPENDENT_AMBULATORY_CARE_PROVIDER_SITE_OTHER): Payer: 59 | Admitting: Family Medicine

## 2020-07-14 ENCOUNTER — Other Ambulatory Visit: Payer: Self-pay

## 2020-07-14 VITALS — BP 132/68 | HR 74 | Temp 97.1°F | Ht 60.5 in | Wt 165.1 lb

## 2020-07-14 DIAGNOSIS — E7800 Pure hypercholesterolemia, unspecified: Secondary | ICD-10-CM | POA: Diagnosis not present

## 2020-07-14 DIAGNOSIS — E6609 Other obesity due to excess calories: Secondary | ICD-10-CM

## 2020-07-14 DIAGNOSIS — R7303 Prediabetes: Secondary | ICD-10-CM

## 2020-07-14 DIAGNOSIS — E559 Vitamin D deficiency, unspecified: Secondary | ICD-10-CM | POA: Diagnosis not present

## 2020-07-14 DIAGNOSIS — D696 Thrombocytopenia, unspecified: Secondary | ICD-10-CM | POA: Diagnosis not present

## 2020-07-14 DIAGNOSIS — Z6831 Body mass index (BMI) 31.0-31.9, adult: Secondary | ICD-10-CM

## 2020-07-14 DIAGNOSIS — Z0000 Encounter for general adult medical examination without abnormal findings: Secondary | ICD-10-CM

## 2020-07-14 DIAGNOSIS — I10 Essential (primary) hypertension: Secondary | ICD-10-CM

## 2020-07-14 DIAGNOSIS — R7401 Elevation of levels of liver transaminase levels: Secondary | ICD-10-CM | POA: Diagnosis not present

## 2020-07-14 DIAGNOSIS — K760 Fatty (change of) liver, not elsewhere classified: Secondary | ICD-10-CM | POA: Diagnosis not present

## 2020-07-14 NOTE — Progress Notes (Signed)
Subjective:    Patient ID: Katherine Tanner, female    DOB: [birth year], 60 y.o.   MRN: [id number]  This visit occurred during the SARS-CoV-2 public health emergency.  Safety protocols were in place, including screening questions prior to the visit, additional usage of staff PPE, and extensive cleaning of exam room while observing appropriate contact time as indicated for disinfecting solutions.    HPI Here for health maintenance exam and to review chronic medical problems    Wt Readings from Last 3 Encounters:  07/14/20 165 lb 1 oz (74.9 kg)  07/06/20 161 lb (73 kg)  05/20/20 164 lb (74.4 kg)   31.71 kg/m   Has lost a lot of weight   Joints are bothering her more  Seeing [facility]  Did xray of her L ankle   Has appt Thursday with Dr Berenice Primas for this  Wearing a soft brace   covid immunized in July - thinking about booster/planning that  Flu shot -allergic/ cannot have  Tdap 6/13  Zoster status -not ready to get shinrix yet    Mammogram 9/21  Self breast exam- no lumps   Pap 11/20 -neg with neg hpv  Had an endocervical polyp removed 11/21   colonoscopy 4/21  HTN bp is stable today  No cp or palpitations or headaches or edema  No side effects to medicines  BP Readings from Last 3 Encounters:  07/14/20 132/68  07/06/20 128/74  05/20/20 122/68     Pulse Readings from Last 3 Encounters:  07/14/20 74  07/06/20 87  05/20/20 77   Losartan hct 50-12.5 mg daily   Fatty liver in the past Lab Results  Component Value Date   ALT 73 (H) 07/07/2020   AST 47 (H) 07/07/2020   ALKPHOS 145 (H) 07/07/2020   BILITOT 0.4 07/07/2020    AST and ALT are up from 25 and 38  On crestor   Alk phos stable    Hx of low platelets Lab Results  Component Value Date   WBC 6.5 07/07/2020   HGB 12.4 07/07/2020   HCT 37.7 07/07/2020   MCV 87.3 07/07/2020   PLT 91.0 (L) 07/07/2020   No excessive bleeding or bruising    Prediabetes  Lab Results   Component Value Date   HGBA1C 5.8 07/07/2020    Hyperlipidemia  Lab Results  Component Value Date   CHOL 149 07/07/2020   CHOL 132 05/11/2020   CHOL 226 (H) 03/23/2020   Lab Results  Component Value Date   HDL 61.90 07/07/2020   HDL 56.40 05/11/2020   HDL 71 03/23/2020   Lab Results  Component Value Date   LDLCALC 77 07/07/2020   LDLCALC 68 05/11/2020   LDLCALC 145 (H) 03/23/2020   Lab Results  Component Value Date   TRIG 53.0 07/07/2020   TRIG 40.0 05/11/2020   TRIG 59 03/23/2020   Lab Results  Component Value Date   CHOLHDL 2 07/07/2020   CHOLHDL 2 05/11/2020   CHOLHDL 3.2 03/23/2020   Lab Results  Component Value Date   LDLDIRECT 147.9 12/17/2012   LDLDIRECT 138.3 12/09/2011  good profile on crestor and good diet   Patient Active Problem List   Diagnosis Date Noted  . Nonalcoholic hepatosteatosis 02/54/2706  . At risk for heart disease 07/06/2020  . Transaminitis 04/07/2020  . Thrombocytopenia ([place]) 07/08/2019  . Insulin resistance 06/19/2019  . Vitamin D deficiency 11/05/2018  . Obstructive sleep apnea 10/10/2017  . Primary osteoarthritis of left knee 12/30/2016  .  Obesity 05/24/2016  . Prediabetes 02/24/2014  . Osteoarthritis of right knee 11/15/2013  . Osteoarthritis of left knee 11/15/2013  . Alkaline phosphatase elevation 01/02/2012  . Hyperlipidemia 01/02/2012  . Routine general medical examination at a health care facility 12/09/2011  . 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 POLYARTHRITIS 01/28/2010  . Bilateral chronic knee pain 12/11/2009  . ANGIOEDEMA 03/27/2007  . Essential hypertension 03/19/2007  . Perennial allergic rhinitis with seasonal variation 03/19/2007  . Moderate intermittent asthma 03/19/2007  . GERD 03/19/2007  . HIATAL HERNIA 03/19/2007  . GESTATIONAL DIABETES 03/19/2007   Past Medical History:  Diagnosis Date  . Allergy    takes Claritin daily and Flonase daily  . Arthritis   . Asthma    uses Albuterol daily  as needed;Singulair nightly;DUlera daily  . Back pain   . Constipation   . Fatty liver   . Food allergy   . GERD (gastroesophageal reflux disease)    occasionally will take OTC meds but states she just watches what she eats  . H/O hiatal hernia   . History of bronchitis    last time 43yr ago  . History of colon polyps   . History of kidney stones   . History of shingles   . Hyperlipidemia   . Hypertension    takes Hyzaar daily  . Joint pain   . Joint pain   . Joint swelling   . Obesity   . Osteoarthritis   . Pneumonia    hx of;last time at least 31yrago  . PONV (postoperative nausea and vomiting)   . Prediabetes   . Shortness of breath   . Sleep apnea   . Swelling of lower extremity   . Trouble in sleeping   . Wheezing    Past Surgical History:  Procedure Laterality Date  . BREAST EXCISIONAL BIOPSY Right   . CARDIAC CATHETERIZATION    . COLONOSCOPY    . ESOPHAGOGASTRODUODENOSCOPY    . KNEE SURGERY Right    arthroscopy  . Right ganglion cyst     x 2  . right lumpectomy  around 1983  . STERIOD INJECTION Left 11/15/2013   Procedure: Marcaine/STEROID INJECTION;  Surgeon: JoAlta CorningMD;  [facility];  Laterality: Left;  . TOTAL KNEE ARTHROPLASTY Right 11/15/2013   Procedure: RIGHT TOTAL KNEE ARTHROPLASTY;  Surgeon: JoAlta CorningMD;  [facility];  Laterality: Right;  . TOTAL KNEE ARTHROPLASTY Left 12/30/2016   Procedure: TOTAL KNEE ARTHROPLASTY;  Surgeon: GrDorna LeitzMD;  [facility]: [facility];  Laterality: Left;  . TUBAL LIGATION     Social History   Tobacco Use  . Smoking status: Never Smoker  . Smokeless tobacco: Never Used  Vaping Use  . Vaping Use: Never used  Substance Use Topics  . Alcohol use: No    Alcohol/week: 0.0 standard drinks  . Drug use: No   Family History  Problem Relation Age of Onset  . Diabetes Father   . Cancer Father   . Liver disease Father   . Alcoholism  Father   . Sarcoidosis Mother   . Glaucoma Mother   . Hypertension Mother   . Hyperlipidemia Mother   . Sleep apnea Mother   . Obesity Mother   . Breast cancer Neg Hx    Allergies  Allergen Reactions  . Ace Inhibitors Shortness Of Breath and Cough    Cough/ breathing problems   . Amoxicillin-Pot Clavulanate  Swelling    SWELLING REACTION UNSPECIFIED   . Influenza Virus Vacc Split Pf     UNSPECIFIED REACTION   . Shellfish Allergy Itching   Current Outpatient Medications on File Prior to Visit  Medication Sig Dispense Refill  . albuterol (VENTOLIN HFA) 108 (90 Base) MCG/ACT inhaler INHALE 2 PUFFS BY MOUTH INTO THE LUNGS EVERY 6 HOURS AS NEEDED FOR WHEEZING OR SHORTNESS OF BREATH. 18 g 12  . baclofen (LIORESAL) 10 MG tablet Take 10 mg by mouth 2 (two) times daily.    . Calcium Citrate-Vitamin D (CALCIUM + D PO) Take 1 tablet by mouth daily.    . DULoxetine (CYMBALTA) 20 MG capsule Take 20 mg by mouth 2 (two) times daily.    [name] EPINEPHRINE 0.3 mg/0.3 mL IJ SOAJ injection INJECT 1 SYRINGE INTO THE MUSCLE ONCE 1 each 1  . fluticasone (FLONASE) 50 MCG/ACT nasal spray Place 2 sprays into both nostrils daily. 48 g 3  . Fluticasone-Salmeterol (ADVAIR DISKUS) 250-50 MCG/DOSE AEPB INHALE 1 PUFF then rinse mouth, twice daily (Patient taking differently: Inhale 1 puff into the lungs daily. INHALE 1 PUFF then rinse mouth, twice daily) 60 each 12  . ipratropium-albuterol (DUONEB) 0.5-2.5 (3) MG/3ML SOLN USE 1 VIAL VIA NEBULIZER EVERY 6 HOURS AS NEEDED 90 mL 5  . loratadine (CLARITIN) 10 MG tablet Take 10 mg by mouth daily.    [name] losartan-hydrochlorothiazide (HYZAAR) 100-25 MG tablet Take 1 tablet by mouth daily. (Patient taking differently: Take 0.5 tablets by mouth daily.) 90 tablet 3  . metFORMIN (GLUCOPHAGE) 500 MG tablet Take 1 tablet (500 mg total) by mouth daily. 30 tablet 0  . montelukast (SINGULAIR) 10 MG tablet Take 1 tablet (10 mg total) by mouth at bedtime. 90 tablet 3  . Multiple Vitamin  (MULTIVITAMIN PO) Take 1 tablet by mouth daily.    . Nebulizers (COMPRESSOR/NEBULIZER) MISC Use as directed 1 each 0  . rosuvastatin (CRESTOR) 10 MG tablet Take 1 tablet (10 mg total) by mouth daily. 90 tablet 3  . traMADol (ULTRAM) 50 MG tablet Take 50 mg by mouth daily.      No current facility-administered medications on file prior to visit.     Review of Systems  Constitutional: Negative for activity change, appetite change, fatigue, fever and unexpected weight change.  HENT: Negative for congestion, ear pain, rhinorrhea, sinus pressure and sore throat.   Eyes: Negative for pain, redness and visual disturbance.  Respiratory: Negative for cough, shortness of breath and wheezing.   Cardiovascular: Negative for chest pain and palpitations.  Gastrointestinal: Negative for abdominal pain, blood in stool, constipation and diarrhea.  Endocrine: Negative for polydipsia and polyuria.  Genitourinary: Negative for dysuria, frequency and urgency.  Musculoskeletal: Positive for arthralgias and back pain. Negative for myalgias.  Skin: Negative for pallor and rash.  Allergic/Immunologic: Negative for environmental allergies.  Neurological: Negative for dizziness, syncope and headaches.  Hematological: Negative for adenopathy. Does not bruise/bleed easily.  Psychiatric/Behavioral: Negative for decreased concentration and dysphoric mood. The patient is not nervous/anxious.        Objective:   Physical Exam Constitutional:      General: She is not in acute distress.    Appearance: Normal appearance. She is well-developed. She is obese. She is not ill-appearing or diaphoretic.  HENT:     Head: Normocephalic and atraumatic.     Right Ear: Tympanic membrane, ear canal and external ear normal.     Left Ear: Tympanic membrane, ear canal and external ear normal.  Nose: Nose normal. No congestion.     Mouth/Throat:     Mouth: Mucous membranes are moist.     Pharynx: Oropharynx is clear. No  posterior oropharyngeal erythema.  Eyes:     General: No scleral icterus.    Extraocular Movements: Extraocular movements intact.     Conjunctiva/sclera: Conjunctivae normal.     Pupils: Pupils are equal, round, and reactive to light.  Neck:     Thyroid: No thyromegaly.     Vascular: No carotid bruit or JVD.  Cardiovascular:     Rate and Rhythm: Normal rate and regular rhythm.     Pulses: Normal pulses.     Heart sounds: Normal heart sounds. No gallop.   Pulmonary:     Effort: Pulmonary effort is normal. No respiratory distress.     Breath sounds: Normal breath sounds. No wheezing.     Comments: Good air exch Chest:     Chest wall: No tenderness.  Abdominal:     General: Bowel sounds are normal. There is no distension or abdominal bruit.     Palpations: Abdomen is soft. There is no mass.     Tenderness: There is no abdominal tenderness.     Hernia: No hernia is present.  Genitourinary:    Comments: Breast exam: No mass, nodules, thickening, tenderness, bulging, retraction, inflamation, nipple discharge or skin changes noted.  No axillary or clavicular LA.     Musculoskeletal:        General: No tenderness. Normal range of motion.     Cervical back: Normal range of motion and neck supple. No rigidity. No muscular tenderness.     Right lower leg: No edema.     Left lower leg: No edema.  Lymphadenopathy:     Cervical: No cervical adenopathy.  Skin:    General: Skin is warm and dry.     Coloration: Skin is not pale.     Findings: No erythema or rash.     Comments: Few sk/tags   Neurological:     Mental Status: She is alert. Mental status is at baseline.     Cranial Nerves: No cranial nerve deficit.     Motor: No abnormal muscle tone.     Coordination: Coordination normal.     Gait: Gait normal.     Deep Tendon Reflexes: Reflexes are normal and symmetric. Reflexes normal.  Psychiatric:        Mood and Affect: Mood normal.        Cognition and Memory: Cognition and memory  normal.           Assessment & Plan:   Problem List Items Addressed This Visit      Cardiovascular and Mediastinum   Essential hypertension    bp in fair control at this time  BP Readings from Last 1 Encounters:  07/14/20 132/68   No changes needed Most recent labs reviewed  Disc lifstyle change with low sodium diet and exercise  Plan to continue losartan hct 50-12.5 mg daily          Digestive   Nonalcoholic hepatosteatosis    Per pt diagnosed with [name] in past  No symptoms  AST and ALT are slt more elevated on crestor  Stable alk phos Great weight loss with lifestyle change         Other   Routine general medical examination at a health care facility - Primary    Reviewed health habits including diet and exercise and skin cancer prevention Reviewed  appropriate screening tests for age  Also reviewed health mt list, fam hx and immunization status , as well as social and family history   See HPI Labs reviewed  Planning covid booster soon  Allergic to flu vaccine  Will consider shingrix later  utd gyn care and mammogram  Enc her to keep up the great job with weight loss       Hyperlipidemia    Disc goals for lipids and reasons to control them Rev last labs with pt Rev low sat fat diet in detail  Good control with crestor 10 mg daily and diet  Watching LFTs       Prediabetes    Lab Results  Component Value Date   HGBA1C 5.8 07/07/2020   Stable Enc her to keep up the good job with wt loss disc imp of low glycemic diet and wt loss to prevent DM2       Obesity    Discussed how this problem influences overall health and the risks it imposes  Reviewed plan for weight loss with lower calorie diet (via better food choices and also portion control or program like weight watchers) and exercise building up to or more than 30 minutes 5 days per week including some aerobic activity   Commended excellent work so far      Vitamin D deficiency    D level is  42.0 on current supplementation  Vitamin D level is therapeutic with current supplementation Disc importance of this to bone and overall health       Thrombocytopenia (HCC)    Platelet ct is now below 100 No symptoms  ? Auto immune cause Ref made to hematology  Will watch for easy bleeding or bruising       Relevant Orders   Ambulatory referral to Hematology   Transaminitis    Baseline fatty liver  Taking statin (still mild) Will continue to obs

## 2020-07-14 NOTE — Patient Instructions (Signed)
Keep up the great work with diet and exercise   Follow up with orthopedics for your snkle  I placed a hematology referral for the platelet problem If you begin bleeding/bruising please let us know   No change in medicines

## 2020-07-14 NOTE — Assessment & Plan Note (Signed)
Baseline fatty liver  Taking statin (still mild) Will continue to obs

## 2020-07-14 NOTE — Assessment & Plan Note (Signed)
bp in fair control at this time  BP Readings from Last 1 Encounters:  07/14/20 132/68   No changes needed Most recent labs reviewed  Disc lifstyle change with low sodium diet and exercise  Plan to continue losartan hct 50-12.5 mg daily

## 2020-07-14 NOTE — Assessment & Plan Note (Signed)
D level is 42.0 on current supplementation  Vitamin D level is therapeutic with current supplementation Disc importance of this to bone and overall health

## 2020-07-14 NOTE — Assessment & Plan Note (Signed)
Lab Results  Component Value Date   HGBA1C 5.8 07/07/2020   Stable Enc her to keep up the good job with wt loss disc imp of low glycemic diet and wt loss to prevent DM2

## 2020-07-14 NOTE — Assessment & Plan Note (Signed)
Reviewed health habits including diet and exercise and skin cancer prevention Reviewed appropriate screening tests for age  Also reviewed health mt list, fam hx and immunization status , as well as social and family history   See HPI Labs reviewed  Planning covid booster soon  Allergic to flu vaccine  Will consider shingrix later  utd gyn care and mammogram  Enc her to keep up the great job with weight loss

## 2020-07-14 NOTE — Assessment & Plan Note (Signed)
Disc goals for lipids and reasons to control them Rev last labs with pt Rev low sat fat diet in detail  Good control with crestor 10 mg daily and diet  Watching LFTs

## 2020-07-14 NOTE — Assessment & Plan Note (Signed)
Platelet ct is now below 100 No symptoms  ? Auto immune cause Ref made to hematology  Will watch for easy bleeding or bruising

## 2020-07-14 NOTE — Assessment & Plan Note (Signed)
Discussed how this problem influences overall health and the risks it imposes  Reviewed plan for weight loss with lower calorie diet (via better food choices and also portion control or program like weight watchers) and exercise building up to or more than 30 minutes 5 days per week including some aerobic activity   Commended excellent work so far

## 2020-07-14 NOTE — Assessment & Plan Note (Signed)
Per pt diagnosed with US in past  No symptoms  AST and ALT are slt more elevated on crestor  Stable alk phos Great weight loss with lifestyle change  

## 2020-07-16 DIAGNOSIS — M79672 Pain in left foot: Secondary | ICD-10-CM | POA: Diagnosis not present

## 2020-07-16 DIAGNOSIS — M25572 Pain in left ankle and joints of left foot: Secondary | ICD-10-CM | POA: Diagnosis not present

## 2020-07-22 ENCOUNTER — Telehealth: Payer: Self-pay | Admitting: Oncology

## 2020-07-22 MED FILL — ADVAIR 250/50 DISKUS: 250-50 | 30 days supply | Qty: 60 | Fill #1

## 2020-07-22 MED FILL — MONTELUKAST SOD 10 MG TAB: 10 | 90 days supply | Qty: 90 | Fill #1

## 2020-07-22 NOTE — Telephone Encounter (Signed)
Received a new pt referral from Dr. Glori Bickers for thrombocytopenia. [name] has been cld and scheduled to see Dr. Alen Blew on 1/26 at 2pm. Pt aware to arrive 20 minutes early.

## 2020-07-23 ENCOUNTER — Ambulatory Visit (INDEPENDENT_AMBULATORY_CARE_PROVIDER_SITE_OTHER): Payer: 59 | Admitting: Adult Health

## 2020-07-27 ENCOUNTER — Encounter (INDEPENDENT_AMBULATORY_CARE_PROVIDER_SITE_OTHER): Payer: Self-pay | Admitting: Family Medicine

## 2020-07-27 ENCOUNTER — Other Ambulatory Visit: Payer: Self-pay

## 2020-07-27 ENCOUNTER — Ambulatory Visit (INDEPENDENT_AMBULATORY_CARE_PROVIDER_SITE_OTHER): Payer: 59 | Admitting: Family Medicine

## 2020-07-27 VITALS — BP 114/70 | HR 75 | Temp 97.9°F | Ht 61.0 in | Wt 158.0 lb

## 2020-07-27 DIAGNOSIS — Z6830 Body mass index (BMI) 30.0-30.9, adult: Secondary | ICD-10-CM | POA: Diagnosis not present

## 2020-07-27 DIAGNOSIS — I10 Essential (primary) hypertension: Secondary | ICD-10-CM | POA: Diagnosis not present

## 2020-07-27 DIAGNOSIS — Z9189 Other specified personal risk factors, not elsewhere classified: Secondary | ICD-10-CM | POA: Diagnosis not present

## 2020-07-27 DIAGNOSIS — R7303 Prediabetes: Secondary | ICD-10-CM

## 2020-07-27 DIAGNOSIS — E669 Obesity, unspecified: Secondary | ICD-10-CM

## 2020-07-27 MED FILL — DULoxetine HCL 30 MG CPEP: 30 | 30 days supply | Qty: 60 | Fill #1

## 2020-07-27 MED FILL — METFORMIN HCL 500 MG TABS: 500 | 30 days supply | Qty: 30 | Fill #0

## 2020-07-28 NOTE — Progress Notes (Signed)
Chief Complaint:   OBESITY Katherine Tanner is here to discuss her progress with her obesity treatment plan along with follow-up of her obesity related diagnoses. [name] is on the Category 2 Plan with breakfast options and states she is following her eating plan approximately 100% of the time. [name] states she is doing yoga- YouTube videos 30 minutes 5 times per week.  Today's visit was #: 60 Starting weight: 198 lbs Starting date: 08/30/2018 Today's weight: 158 lbs Today's date: 07/27/2020 Total lbs lost to date: 40 lbs Total lbs lost since last in-office visit: 3 lbs Total weight loss percentage to date: -20.20%  Interim History: Pt is sticking to the plan and only eating snacks for allotted amount of calories per day. She snacks on [place] bars. She does chair exercises via YouTube. She reports hunger and cravings are controlled with portions of only allotted amounts.  Assessment/Plan:   1. Essential hypertension Pt reports BP runs 120/70 at home. She reports no lows or dizziness, etc. She is taking 1/2 tablet of Hyzaar 100-25 mg daily and tolerating it well.  BP Readings from Last 3 Encounters:  07/29/20 (!) 143/77  07/27/20 114/70  07/14/20 132/68   Plan: Continue Hyzaar at current dose. BP at goal. Continue to monitor closely as pt continues with weight loss.   2. Pre-diabetes Discussed labs with patient today. Pt is on Metformin daily and tolerating it well without side effects. A1c done at PCP's office recently was 5.8  (up from 5.7) and pt was wondering why.  Katherine Tanner has a diagnosis of prediabetes based on her elevated HgA1c and was informed this puts her at greater risk of developing diabetes. She continues to work on diet and exercise to decrease her risk of diabetes. She denies nausea or hypoglycemia.  Lab Results  Component Value Date   HGBA1C 5.8 07/07/2020   Lab Results  Component Value Date   INSULIN 11.2 03/23/2020   INSULIN 6.2 12/24/2019   INSULIN 4.0 09/11/2019    INSULIN 9.8 05/06/2019   INSULIN 8.8 01/14/2019   Plan: Change Metformin from every morning to 1 tablet after lunch. Continue same dose. No need for refill, as pt just refilled it today. Counseling done on proteins vs carbs and how these foods impact A1c. Focus on not skipping proteins and having snacks with 10:1 ratio of IEP:PIRJJ protein. Recheck in 3 months.  3. At risk for diabetes mellitus - Skyleen was given extensive diabetes prevention education and counseling today of more than 18 minutes.  - Counseled patient on pathophysiology of disease and discussed various treatment options which always includes dietary and lifestyle modification as first line.   - Importance of healthy diet with very limited amounts of simple carbohydrates discussed with patient in addition to regular aerobic exercise to an eventual goal of 5min 5d/week or more.  - Handouts provided at patient's desire and or told to go online at the American Diabetes Association website for further information  4. Class 1 obesity with serious comorbidity and body mass index (BMI) of 30.0 to 30.9 in adult, unspecified obesity type Katherine Tanner is currently in the action stage of change. As such, her goal is to continue with weight loss efforts. She has agreed to the Category 2 Plan with breakfast options.   Exercise goals: As is   Behavioral modification strategies: increasing lean protein intake, decreasing simple carbohydrates and planning for success.  Katherine Tanner has agreed to follow-up with our clinic in 3-4 weeks. She was informed of the  importance of frequent follow-up visits to maximize her success with intensive lifestyle modifications for her multiple health conditions.   Objective:   Blood pressure 114/70, pulse 75, temperature 97.9 F (36.6 C), height 5\' 1"  (1.549 m), weight 158 lb (71.7 kg), last menstrual period 02/20/2014, SpO2 99 %. Body mass index is 29.85 kg/m.  General: Cooperative, alert, well developed, in no  acute distress. HEENT: Conjunctivae and lids unremarkable. Cardiovascular: Regular rhythm.  Lungs: Normal work of breathing. Neurologic: No focal deficits.   Lab Results  Component Value Date   CREATININE 0.59 07/07/2020   BUN 20 07/07/2020   NA 137 07/07/2020   K 4.1 07/07/2020   CL 98 07/07/2020   CO2 34 (H) 07/07/2020   Lab Results  Component Value Date   ALT 73 (H) 07/07/2020   AST 47 (H) 07/07/2020   ALKPHOS 145 (H) 07/07/2020   BILITOT 0.4 07/07/2020   Lab Results  Component Value Date   HGBA1C 5.8 07/07/2020   HGBA1C 5.7 (H) 03/23/2020   HGBA1C 5.6 12/24/2019   HGBA1C 5.5 09/11/2019   HGBA1C 5.4 05/06/2019   Lab Results  Component Value Date   INSULIN 11.2 03/23/2020   INSULIN 6.2 12/24/2019   INSULIN 4.0 09/11/2019   INSULIN 9.8 05/06/2019   INSULIN 8.8 01/14/2019   Lab Results  Component Value Date   TSH 1.32 07/07/2020   Lab Results  Component Value Date   CHOL 149 07/07/2020   HDL 61.90 07/07/2020   LDLCALC 77 07/07/2020   LDLDIRECT 147.9 12/17/2012   TRIG 53.0 07/07/2020   CHOLHDL 2 07/07/2020   Lab Results  Component Value Date   WBC 6.5 07/07/2020   HGB 12.4 07/07/2020   HCT 37.7 07/07/2020   MCV 87.3 07/07/2020   PLT 91.0 (L) 07/07/2020   Lab Results  Component Value Date   IRON 63 07/07/2020    Attestation Statements:   Reviewed by clinician on day of visit: allergies, medications, problem list, medical history, surgical history, family history, social history, and previous encounter notes.  Coral Ceo, am acting as Location manager for [employer], DO.  I have reviewed the above documentation for accuracy and completeness, and I agree with the above. Marjory Sneddon, D.O.  The [place] was signed into law in 2016 which includes the topic of electronic health records.  This provides immediate access to information in MyChart.  This includes consultation notes, operative notes, office notes, lab  results and pathology reports.  If you have any questions about what you read please let us know at your next visit so we can discuss your concerns and take corrective action if need be.  We are right here with you.

## 2020-07-29 ENCOUNTER — Inpatient Hospital Stay: Payer: 59 | Attending: Oncology | Admitting: Oncology

## 2020-07-29 ENCOUNTER — Other Ambulatory Visit: Payer: Self-pay

## 2020-07-29 VITALS — BP 143/77 | HR 89 | Temp 98.1°F | Resp 20 | Ht 61.0 in | Wt 163.6 lb

## 2020-07-29 DIAGNOSIS — Z7951 Long term (current) use of inhaled steroids: Secondary | ICD-10-CM | POA: Diagnosis not present

## 2020-07-29 DIAGNOSIS — Z833 Family history of diabetes mellitus: Secondary | ICD-10-CM | POA: Insufficient documentation

## 2020-07-29 DIAGNOSIS — I10 Essential (primary) hypertension: Secondary | ICD-10-CM | POA: Diagnosis not present

## 2020-07-29 DIAGNOSIS — E785 Hyperlipidemia, unspecified: Secondary | ICD-10-CM | POA: Insufficient documentation

## 2020-07-29 DIAGNOSIS — E669 Obesity, unspecified: Secondary | ICD-10-CM | POA: Diagnosis not present

## 2020-07-29 DIAGNOSIS — Z79899 Other long term (current) drug therapy: Secondary | ICD-10-CM | POA: Insufficient documentation

## 2020-07-29 DIAGNOSIS — Z7984 Long term (current) use of oral hypoglycemic drugs: Secondary | ICD-10-CM | POA: Insufficient documentation

## 2020-07-29 DIAGNOSIS — N839 Noninflammatory disorder of ovary, fallopian tube and broad ligament, unspecified: Secondary | ICD-10-CM | POA: Diagnosis not present

## 2020-07-29 DIAGNOSIS — Z8249 Family history of ischemic heart disease and other diseases of the circulatory system: Secondary | ICD-10-CM | POA: Insufficient documentation

## 2020-07-29 DIAGNOSIS — G4730 Sleep apnea, unspecified: Secondary | ICD-10-CM | POA: Diagnosis not present

## 2020-07-29 DIAGNOSIS — D696 Thrombocytopenia, unspecified: Secondary | ICD-10-CM | POA: Diagnosis not present

## 2020-07-29 DIAGNOSIS — K219 Gastro-esophageal reflux disease without esophagitis: Secondary | ICD-10-CM | POA: Insufficient documentation

## 2020-07-29 DIAGNOSIS — Z8349 Family history of other endocrine, nutritional and metabolic diseases: Secondary | ICD-10-CM | POA: Insufficient documentation

## 2020-07-29 DIAGNOSIS — J45909 Unspecified asthma, uncomplicated: Secondary | ICD-10-CM | POA: Diagnosis not present

## 2020-07-29 NOTE — Progress Notes (Signed)
Reason for the request: Thrombocytopenia     HPI: I was asked by Dr. Glori Bickers to evaluate Katherine Tanner for thrombocytopenia.  She is a 60 year old woman with history of hypertension, hyperlipidemia and obesity who was found to have mild thrombocytopenia based on laboratory testing on July 07, 2020.  At that time her platelet count was 91 with a white cell count of 6.5 and hemoglobin of 12.4.  Differential was normal otherwise.  Previous laboratory data indicates mild thrombocytopenia dating back to January 2021 with a platelet count of 110. Platelet count was 135 in June 2021.  Otherwise, her laboratory data including CBC previously were normal.  In 2019 she was noted to have a transient drop in her platelet count of 125.    She has a reported symptoms of back pain and radiculopathy and underwent an MRI on July 12, 2020 which showed left adnexal mass that is borderline neoplasm as well as a mild noncompressive disc disease around L5 and S1.  She has a reported diffuse joint pain including knee and ankle pain.  She has not reported any hematuria, hematochezia or vaginal bleeding.  She denies any epistaxis or easy bruising.  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     She does not report any headaches, blurry vision, syncope or seizures. Does not report any fevers, chills or sweats.  Does not report any cough, wheezing or hemoptysis.  Does not report any chest pain, palpitation, orthopnea or leg edema.  Does not report any nausea, vomiting or abdominal pain.  Does not report any constipation or diarrhea.  Does not report any skeletal complaints.    Does not report frequency, urgency or hematuria.  Does not report any skin rashes or lesions. Does not report any heat or cold intolerance.  Does not report any lymphadenopathy or petechiae.  Does not report any anxiety or depression.  Remaining review of systems is negative.    Past Medical History:  Diagnosis Date  . Allergy    takes  Claritin daily and Flonase daily  . Arthritis   . Asthma    uses Albuterol daily as needed;Singulair nightly;DUlera daily  . Back pain   . Constipation   . Fatty liver   . Food allergy   . GERD (gastroesophageal reflux disease)    occasionally will take OTC meds but states she just watches what she eats  . H/O hiatal hernia   . History of bronchitis    last time 28yrs ago  . History of colon polyps   . History of kidney stones   . History of shingles   . Hyperlipidemia   . Hypertension    takes Hyzaar daily  . Joint pain   . Joint pain   . Joint swelling   . Obesity   . Osteoarthritis   . Pneumonia    hx of;last time at least 39yrs ago  . PONV (postoperative nausea and vomiting)   . Prediabetes   . Shortness of breath   . Sleep apnea   . Swelling of lower extremity   . Trouble in sleeping   . Wheezing   :  Past Surgical History:  Procedure Laterality Date  . BREAST EXCISIONAL BIOPSY Right   . CARDIAC CATHETERIZATION    . COLONOSCOPY    . ESOPHAGOGASTRODUODENOSCOPY    . KNEE SURGERY Right    arthroscopy  . Right ganglion cyst     x 2  . right lumpectomy  around 1983  . STERIOD  INJECTION Left 11/15/2013   Procedure: Marcaine/STEROID INJECTION;  Surgeon: Alta Corning, MD;  Location: [facility];  Service: Orthopedics;  Laterality: Left;  . TOTAL KNEE ARTHROPLASTY Right 11/15/2013   Procedure: RIGHT TOTAL KNEE ARTHROPLASTY;  Surgeon: Alta Corning, MD;  Location: [facility];  Service: Orthopedics;  Laterality: Right;  . TOTAL KNEE ARTHROPLASTY Left 12/30/2016   Procedure: TOTAL KNEE ARTHROPLASTY;  Surgeon: Dorna Leitz, MD;  Location: [facility];  Service: Orthopedics;  Laterality: Left;  . TUBAL LIGATION    :   Current Outpatient Medications:  .  albuterol (VENTOLIN HFA) 108 (90 Base) MCG/ACT inhaler, INHALE 2 PUFFS BY MOUTH INTO THE LUNGS EVERY 6 HOURS AS NEEDED FOR WHEEZING OR SHORTNESS OF BREATH., Disp: 18 g, Rfl: 12 .  baclofen (LIORESAL) 10 MG tablet, Take 10 mg by mouth 2  (two) times daily., Disp: , Rfl:  .  Calcium Citrate-Vitamin D (CALCIUM + D PO), Take 1 tablet by mouth daily., Disp: , Rfl:  .  DULoxetine (CYMBALTA) 20 MG capsule, Take 20 mg by mouth 2 (two) times daily., Disp: , Rfl:  .  DULoxetine (CYMBALTA) 30 MG capsule, Take 30 mg by mouth 2 (two) times daily., Disp: , Rfl:  .  EPINEPHRINE 0.3 mg/0.3 mL IJ SOAJ injection, INJECT 1 SYRINGE INTO THE MUSCLE ONCE, Disp: 1 each, Rfl: 1 .  fluticasone (FLONASE) 50 MCG/ACT nasal spray, Place 2 sprays into both nostrils daily., Disp: 48 g, Rfl: 3 .  Fluticasone-Salmeterol (ADVAIR DISKUS) 250-50 MCG/DOSE AEPB, INHALE 1 PUFF then rinse mouth, twice daily (Patient taking differently: Inhale 1 puff into the lungs daily. INHALE 1 PUFF then rinse mouth, twice daily), Disp: 60 each, Rfl: 12 .  ipratropium-albuterol (DUONEB) 0.5-2.5 (3) MG/3ML SOLN, USE 1 VIAL VIA NEBULIZER EVERY 6 HOURS AS NEEDED, Disp: 90 mL, Rfl: 5 .  loratadine (CLARITIN) 10 MG tablet, Take 10 mg by mouth daily., Disp: , Rfl:  .  losartan-hydrochlorothiazide (HYZAAR) 100-25 MG tablet, Take 0.5 tablets by mouth daily., Disp: , Rfl:  .  metFORMIN (GLUCOPHAGE) 500 MG tablet, Take 1 tablet (500 mg total) by mouth daily., Disp: 30 tablet, Rfl: 0 .  montelukast (SINGULAIR) 10 MG tablet, Take 1 tablet (10 mg total) by mouth at bedtime., Disp: 90 tablet, Rfl: 3 .  Multiple Vitamin (MULTIVITAMIN PO), Take 1 tablet by mouth daily., Disp: , Rfl:  .  Nebulizers (COMPRESSOR/NEBULIZER) MISC, Use as directed, Disp: 1 each, Rfl: 0 .  rosuvastatin (CRESTOR) 10 MG tablet, Take 1 tablet (10 mg total) by mouth daily., Disp: 90 tablet, Rfl: 3 .  traMADol (ULTRAM) 50 MG tablet, Take 50 mg by mouth daily. , Disp: , Rfl: :  Allergies  Allergen Reactions  . Ace Inhibitors Shortness Of Breath and Cough    Cough/ breathing problems   . Amoxicillin-Pot Clavulanate Swelling    SWELLING REACTION UNSPECIFIED   . Influenza Virus Vacc Split Pf     UNSPECIFIED REACTION   .  Shellfish Allergy Itching  :  Family History  Problem Relation Age of Onset  . Diabetes Father   . Cancer Father   . Liver disease Father   . Alcoholism Father   . Sarcoidosis Mother   . Glaucoma Mother   . Hypertension Mother   . Hyperlipidemia Mother   . Sleep apnea Mother   . Obesity Mother   . Breast cancer Neg Hx   :  Social History   Socioeconomic History  . Marital status: Married    Spouse name:  Legrand Como  . Number of children: 1  . Years of education: Not on file  . Highest education level: Not on file  Occupational History  . Occupation: Tax adviser: [employer]    Employer: [employer]  Tobacco Use  . Smoking status: Never Smoker  . Smokeless tobacco: Never Used  Vaping Use  . Vaping Use: Never used  Substance and Sexual Activity  . Alcohol use: No    Alcohol/week: 0.0 standard drinks  . Drug use: No  . Sexual activity: Yes  Other Topics Concern  . Not on file  Social History Narrative  . Not on file   Social Determinants of Health   Financial Resource Strain: Not on file  Food Insecurity: Not on file  Transportation Needs: Not on file  Physical Activity: Not on file  Stress: Not on file  Social Connections: Not on file  Intimate Partner Violence: Not on file  :  Pertinent items are noted in HPI.  Exam: Blood pressure (!) 143/77, pulse 89, temperature 98.1 F (36.7 C), temperature source Tympanic, resp. rate 20, height 5\' 1"  (1.549 m), weight 163 lb 9.6 oz (74.2 kg), last menstrual period 02/20/2014, SpO2 100 %.  ECOG 1  General appearance: alert and cooperative appeared without distress. Head: atraumatic without any abnormalities. Eyes: conjunctivae/corneas clear. PERRL.  Sclera anicteric. Throat: lips, mucosa, and tongue normal; without oral thrush or ulcers. Resp: clear to auscultation bilaterally without rhonchi, wheezes or dullness to percussion. Cardio: regular rate and rhythm, S1, S2 normal, no murmur, click, rub or  gallop GI: soft, non-tender; bowel sounds normal; no masses,  no organomegaly Skin: Skin color, texture, turgor normal. No rashes or lesions Lymph nodes: Cervical, supraclavicular, and axillary nodes normal. Neurologic: Grossly normal without any motor, sensory or deep tendon reflexes. Musculoskeletal: No joint deformity or effusion.      MR LUMBAR SPINE WO CONTRAST  Result Date: 07/12/2020 CLINICAL DATA:  Initial evaluation for lumbar radiculopathy, pain, weakness and numbness in bilateral knees and left ankle since 2018. EXAM: MRI LUMBAR SPINE WITHOUT CONTRAST TECHNIQUE: Multiplanar, multisequence MR imaging of the lumbar spine was performed. No intravenous contrast was administered. COMPARISON:  Comparison made with previous radiograph from 06/09/2020. FINDINGS: Segmentation: Standard. Lowest well-formed disc space labeled the L5-S1 level. Alignment: Mild levoscoliosis. Trace anterolisthesis of L4 on L5, chronic and facet mediated. Vertebrae: Vertebral body height well maintained without acute or chronic fracture. Bone marrow signal intensity within normal limits. No worrisome osseous lesions. Mild discogenic reactive endplate change present about the L5-S1 interspace. No other abnormal marrow edema. Conus medullaris and cauda equina: Conus extends to the L1-2 level. Conus and cauda equina appear normal. Paraspinal and other soft tissues: Sinus paraspinous soft tissues within normal limits. Subcentimeter simple cyst partially visualized at the interpolar right kidney. There is an apparent soft tissue mass within the left adnexa, partially visualized. This measures approximately 6.0 x 4.3 cm (series 5, image 42). An adjacent 2.8 cm simple cyst noted (series 5, image 37). Findings indeterminate, but could reflect an ovarian based neoplasm. Disc levels: T12-L1: Normal interspace. Mild left-sided facet hypertrophy. No stenosis. L1-2:  Normal interspace.  Minimal facet hypertrophy.  No stenosis. L2-3:  Degenerative intervertebral disc space narrowing with mild disc bulge and disc desiccation. Minimal facet hypertrophy. No canal or foraminal stenosis. L3-4: Minimal disc bulge. Superimposed small biforaminal disc protrusions, slightly larger on the left (series 5, image 19). Mild facet and ligament flavum hypertrophy. No significant spinal stenosis. Foramina remain  patent. L4-5: Trace anterolisthesis. No significant disc bulge. Mild to moderate facet and ligament flavum hypertrophy. Borderline mild spinal stenosis. Foramina remain patent. L5-S1: Mild diffuse disc bulge with disc desiccation. Associated reactive endplate change with marginal endplate osteophytic spurring. Mild bilateral facet hypertrophy. Epidural lipomatosis. No significant spinal stenosis. Foramina remain patent. IMPRESSION: 1. 6.0 x 4.3 cm soft tissue mass within the left adnexa, partially visualized. Finding is indeterminate, but could potentially reflect an ovarian based neoplasm. Further evaluation with dedicated pelvic ultrasound recommended. 2. Mild noncompressive disc bulging with facet hypertrophy at L5-S1 without significant stenosis or neural impingement. 3. Small biforaminal disc protrusions at L3-4 without stenosis or impingement. Electronically Signed   By: Jeannine Boga M.D.   On: 07/12/2020 19:31      Assessment and Plan:    60 year old woman with:  1.  Thrombocytopenia noted as far back as 2019 with platelet counts have ranged close to normal range and as low as 91 in January 2022.  She has normal CBC otherwise.  Laboratory data for the last 13 years were reviewed and discussed with the patient today.  Differential diagnosis of her thrombocytopenia was also reviewed.  Thrombocytopenia using the mild and fluctuating at least since 2019.  Etiology is includes autoimmune causes such as ITP versus medication versus a splenic sequestration were discussed.  At this time, I have recommended that continued monitoring  without any intervention.  Risk of bleeding is very low and a trial of prednisone would be indicated if she has platelet count less than 50.  He does not require any platelet transfusion or growth factor support.  I recommended repeating laboratory testing in 4 months and reevaluation at that time.  2.  Ovarian mass: Seen incidentally by MRI.  I urged to follow-up with her gynecologist regarding this issue.  3.  Follow-up: Will be in 4 months for repeat follow-up.  45  minutes were dedicated to this visit. The time was spent on reviewing laboratory data, imaging studies, discussing treatment options, discussing differential diagnosis and answering questions regarding future plan.    A copy of this consult has been forwarded to the requesting physician.

## 2020-08-06 ENCOUNTER — Other Ambulatory Visit (HOSPITAL_COMMUNITY): Payer: Self-pay | Admitting: Anesthesiology

## 2020-08-06 MED FILL — traMADol HCL 50 MG TABS: 50 | 22 days supply | Qty: 90 | Fill #0

## 2020-08-13 ENCOUNTER — Other Ambulatory Visit: Payer: Self-pay | Admitting: Family Medicine

## 2020-08-13 MED FILL — BACLOFEN 10 MG TABS: 10 | 30 days supply | Qty: 60 | Fill #1

## 2020-08-13 MED FILL — LOSARTAN-HCTZ 100-25 MG TAB: 100-25 | 90 days supply | Qty: 90 | Fill #0

## 2020-08-17 ENCOUNTER — Encounter (INDEPENDENT_AMBULATORY_CARE_PROVIDER_SITE_OTHER): Payer: Self-pay | Admitting: Adult Health

## 2020-08-17 ENCOUNTER — Other Ambulatory Visit: Payer: Self-pay

## 2020-08-17 ENCOUNTER — Ambulatory Visit (INDEPENDENT_AMBULATORY_CARE_PROVIDER_SITE_OTHER): Payer: 59 | Admitting: Adult Health

## 2020-08-17 ENCOUNTER — Ambulatory Visit (INDEPENDENT_AMBULATORY_CARE_PROVIDER_SITE_OTHER): Payer: 59 | Admitting: Family Medicine

## 2020-08-17 ENCOUNTER — Other Ambulatory Visit (INDEPENDENT_AMBULATORY_CARE_PROVIDER_SITE_OTHER): Payer: Self-pay | Admitting: Adult Health

## 2020-08-17 VITALS — BP 129/73 | HR 74 | Temp 98.4°F | Ht 61.0 in | Wt 157.0 lb

## 2020-08-17 DIAGNOSIS — E663 Overweight: Secondary | ICD-10-CM | POA: Insufficient documentation

## 2020-08-17 DIAGNOSIS — Z6830 Body mass index (BMI) 30.0-30.9, adult: Secondary | ICD-10-CM | POA: Diagnosis not present

## 2020-08-17 DIAGNOSIS — R7303 Prediabetes: Secondary | ICD-10-CM | POA: Diagnosis not present

## 2020-08-17 DIAGNOSIS — Z9189 Other specified personal risk factors, not elsewhere classified: Secondary | ICD-10-CM

## 2020-08-17 DIAGNOSIS — E669 Obesity, unspecified: Secondary | ICD-10-CM | POA: Diagnosis not present

## 2020-08-17 DIAGNOSIS — Z6828 Body mass index (BMI) 28.0-28.9, adult: Secondary | ICD-10-CM | POA: Insufficient documentation

## 2020-08-17 DIAGNOSIS — Z6829 Body mass index (BMI) 29.0-29.9, adult: Secondary | ICD-10-CM | POA: Insufficient documentation

## 2020-08-17 DIAGNOSIS — I10 Essential (primary) hypertension: Secondary | ICD-10-CM | POA: Diagnosis not present

## 2020-08-17 MED ORDER — METFORMIN HCL 500 MG PO TABS
500.0000 mg | ORAL_TABLET | Freq: Every day | ORAL | 0 refills | Status: DC
Start: 2020-08-17 — End: 2020-09-14

## 2020-08-18 NOTE — Progress Notes (Signed)
Chief Complaint:   OBESITY Katherine Tanner is here to discuss her progress with her obesity treatment plan along with follow-up of her obesity related diagnoses. Katherine Tanner is on the Category 2 Plan with breakfast options and states she is following her eating plan approximately 100% of the time. Katherine Tanner states she is YouTube videos 30 minutes 5 times per week.  Today's visit was #: 59 Starting weight: 198 lbs Starting date: 08/30/2018 Today's weight: 157 lbs Today's date: 08/17/2020 Total lbs lost to date: 41 lbs Total lbs lost since last in-office visit: 1 lb  Interim History: Katherine Tanner's original goal was to lose down to under 160 lbs. Her current weight is 157 lbs with a BMI of 29.7.  She was recently seen by oncology for thrombocytopenia. She denies unusual bleeding or bruising. Follow up with oncology in 4 months. She has changed snacks to protein one bar or [place] yogurt. She reports a decrease in late afternoon cravings with the snacking habit.  Subjective:   1. Pre-diabetes Katherine Tanner has changed Metformin 500 mg from breakfast to lunch. 07/07/2020 A1c resulted 5.8 with a normal blood glucose.  Lab Results  Component Value Date   HGBA1C 5.8 07/07/2020   Lab Results  Component Value Date   INSULIN 11.2 03/23/2020   INSULIN 6.2 12/24/2019   INSULIN 4.0 09/11/2019   INSULIN 9.8 05/06/2019   INSULIN 8.8 01/14/2019    2. Essential hypertension Katherine Tanner's BP and heart rate excellent at OV. She has been on Hyzaar 100-25 mg 1/2 tab daily for months. She denies acute cardiac symptoms.  BP Readings from Last 3 Encounters:  08/17/20 129/73  07/29/20 (!) 143/77  07/27/20 114/70    3. At risk for heart disease Katherine Tanner is at a higher than average risk for cardiovascular disease due to obesity, hypertension, and hyperlipidemia.  Assessment/Plan:   1. Pre-diabetes Katherine Tanner will continue to work on weight loss, exercise, and decreasing simple carbohydrates to help decrease the risk of diabetes.    - metFORMIN (GLUCOPHAGE) 500 MG tablet; Take 1 tablet (500 mg total) by mouth daily.  Dispense: 30 tablet; Refill: 0  2. Essential hypertension Katherine Tanner is working on healthy weight loss and exercise to improve blood pressure control. We will watch for signs of hypotension as she continues her lifestyle modifications. PCP is managing Hyzaar.  3. At risk for heart disease Katherine Tanner was given approximately 15 minutes of coronary artery disease prevention counseling today. She is 60 y.o. female and has risk factors for heart disease including obesity. We discussed intensive lifestyle modifications today with an emphasis on specific weight loss instructions and strategies.   Repetitive spaced learning was employed today to elicit superior memory formation and behavioral change.  4. Class 1 obesity with serious comorbidity and body mass index (BMI) of 30.0 to 30.9 in adult, unspecified obesity type Katherine Tanner is currently in the action stage of change. As such, her goal is to continue with weight loss efforts. She has agreed to the Category 2 Plan.   Exercise goals: As is  Behavioral modification strategies: increasing lean protein intake, meal planning and cooking strategies and planning for success.  Katherine Tanner has agreed to follow-up with our clinic in 4 weeks. She was informed of the importance of frequent follow-up visits to maximize her success with intensive lifestyle modifications for her multiple health conditions.   Objective:   Blood pressure 129/73, pulse 74, temperature 98.4 F (36.9 C), height 5\' 1"  (1.549 m), weight 157 lb (71.2 kg), last menstrual period  02/20/2014, SpO2 100 %. Body mass index is 29.66 kg/m.  General: Cooperative, alert, well developed, in no acute distress. HEENT: Conjunctivae and lids unremarkable. Cardiovascular: Regular rhythm.  Lungs: Normal work of breathing. Neurologic: No focal deficits.   Lab Results  Component Value Date   CREATININE 0.59 07/07/2020    BUN 20 07/07/2020   NA 137 07/07/2020   K 4.1 07/07/2020   CL 98 07/07/2020   CO2 34 (H) 07/07/2020   Lab Results  Component Value Date   ALT 73 (H) 07/07/2020   AST 47 (H) 07/07/2020   ALKPHOS 145 (H) 07/07/2020   BILITOT 0.4 07/07/2020   Lab Results  Component Value Date   HGBA1C 5.8 07/07/2020   HGBA1C 5.7 (H) 03/23/2020   HGBA1C 5.6 12/24/2019   HGBA1C 5.5 09/11/2019   HGBA1C 5.4 05/06/2019   Lab Results  Component Value Date   INSULIN 11.2 03/23/2020   INSULIN 6.2 12/24/2019   INSULIN 4.0 09/11/2019   INSULIN 9.8 05/06/2019   INSULIN 8.8 01/14/2019   Lab Results  Component Value Date   TSH 1.32 07/07/2020   Lab Results  Component Value Date   CHOL 149 07/07/2020   HDL 61.90 07/07/2020   LDLCALC 77 07/07/2020   LDLDIRECT 147.9 12/17/2012   TRIG 53.0 07/07/2020   CHOLHDL 2 07/07/2020   Lab Results  Component Value Date   WBC 6.5 07/07/2020   HGB 12.4 07/07/2020   HCT 37.7 07/07/2020   MCV 87.3 07/07/2020   PLT 91.0 (L) 07/07/2020   Lab Results  Component Value Date   IRON 63 07/07/2020    Attestation Statements:   Reviewed by clinician on day of visit: allergies, medications, problem list, medical history, surgical history, family history, social history, and previous encounter notes.  Coral Ceo, am acting as Location manager for Mina Marble, NP.  I have reviewed the above documentation for accuracy and completeness, and I agree with the above. -  Jerin Franzel d. Terrius Gentile, NP-C

## 2020-08-19 DIAGNOSIS — N949 Unspecified condition associated with female genital organs and menstrual cycle: Secondary | ICD-10-CM | POA: Diagnosis not present

## 2020-08-20 MED FILL — METFORMIN HCL 500 MG TABS: 500 | 30 days supply | Qty: 30 | Fill #0

## 2020-08-26 ENCOUNTER — Other Ambulatory Visit (HOSPITAL_COMMUNITY): Payer: Self-pay | Admitting: Physical Medicine and Rehabilitation

## 2020-08-26 MED FILL — DULoxetine HCL 30 MG CPEP: 30 | 30 days supply | Qty: 60 | Fill #0

## 2020-09-03 ENCOUNTER — Other Ambulatory Visit (HOSPITAL_COMMUNITY): Payer: Self-pay | Admitting: Physical Medicine and Rehabilitation

## 2020-09-03 DIAGNOSIS — G894 Chronic pain syndrome: Secondary | ICD-10-CM | POA: Diagnosis not present

## 2020-09-03 DIAGNOSIS — G4700 Insomnia, unspecified: Secondary | ICD-10-CM | POA: Diagnosis not present

## 2020-09-03 DIAGNOSIS — M25569 Pain in unspecified knee: Secondary | ICD-10-CM | POA: Diagnosis not present

## 2020-09-03 DIAGNOSIS — M7552 Bursitis of left shoulder: Secondary | ICD-10-CM | POA: Diagnosis not present

## 2020-09-03 MED FILL — traMADol HCL 50 MG TABS: 50 | 22 days supply | Qty: 90 | Fill #0

## 2020-09-04 ENCOUNTER — Ambulatory Visit: Payer: 59 | Admitting: Internal Medicine

## 2020-09-09 NOTE — Progress Notes (Signed)
Patient ID: Katherine Tanner, female    DOB: Nov 20, 1960, 60 y.o.   MRN: [id number]  HPI female never smoker followed for Allergic rhinitis, Asthma,OSA, complicated by GERD Office spirometry: 11/24/2014: WNL FVC 2.39/97%, FEV1 2.09/103%, FEV1/FVC 0.88, FEF 25-75 percent 3.27/122% Office Spirometry 07/06/17-WNL-FVC 2.25/91%, FEV1 2.04/104%, ratio 0.91, FEF 25-75% 3.62/173% FENO 10/09/17   11   Not elevated Methacholine inhalation challenge test 01/25/2018-interpreted as positive because of initiation of hard coughing although FEV1 only declined by 15%. HST 10/27/2017-AHI 7.3/hour, desaturation to 88%, body weight 193 pounds ----------------------------------------------------------------------------------   09/02/19- 60 year old female never smoker followed for Allergic rhinitis, Asthma, OSA(minimal- no CPAP), complicated by GERD, HTN, Thrombocytopenia, PreDiabetes,  Neb Duoneb, Singulair, Advair 250, Flonase, albuterol hfa,  Epipen for Shellfish allergy ------f/u Asthmatic bronchitis. Body weight today - 175 lbs, down from 200 a few years ago. Advair daily. Used rescue once last week, neb once last month.No recent exacerbation. Triggers include weather, closed rooms, seasonal pollens. In some seasons she has difficulty with long walks from parking and asks [facility] parking- discussed.  Hx allergic reaction to flu vaccine. Expressed concern of possible similar reaction to Covid vax. Discussed. I recommended she get covid vax. Ok to pre-treat with benadryl.  09/10/20- 60 year old female never smoker followed for Allergic rhinitis, Asthma,  Hx OSA(minimal- no CPAP/ lost weight), complicated by GERD, HTN, Thrombocytopenia, PreDiabetes,  Neb Duoneb, Singulair, Advair 250, Flonase, albuterol hfa,  Epipen for Shellfish allergy  Covid vax-3 Phizer Flu vax-no Body weight today 160 lbs. Has reached her goal, aims now to stabilize. Using rescue hfa 2x/ week, never needing nebulizer or Epipen- avoids  shellfish. Needs refills. Cold air tightens occasionally. No Spring seasonal trouble yet.  No acute events. Being watched for thrombocytopenia- potential ITP.  Review of Systems-Per HPI.   + = positive Constitutional:   +diet>   weight loss, night sweats, fevers, chills, fatigue, lassitude. HEENT:   +  headaches, difficulty swallowing, tooth/dental problems, sore throat,       No-sneezing, itching, no-ear ache, + nasal congestion, no- post nasal drip,  CV:   chest pain, orthopnea, PND, swelling in lower extremities, anasarca, dizziness, palpitations Resp: No-   shortness of breath with exertion or at rest.             productive cough,  No- non-productive cough,  No- coughing up of blood.              No-   change in color of mucus. No- wheezing.   Skin:  GI:  No-   heartburn, indigestion, abdominal pain, nausea, vomiting,  GU: . MS:  No-   joint pain or swelling.  . Neuro-     nothing unusual Psych:  No- change in mood or affect. No depression or anxiety.  No memory loss.  Objective:   Physical Exam General- Alert, Oriented, Affect-appropriate, Distress- none acute;  Skin- rash-none, lesions- none, excoriation- none Lymphadenopathy- none Head- atraumatic            Eyes- Gross vision intact, PERRLA, conjunctivae clear, no discolored secretions            Ears- Hearing, canals-normal            Nose-    no-Septal dev, mucus, polyps, erosion, perforation             Throat- Mallampati III , mucosa clear , drainage- none, tonsils- atrophic Neck- flexible , trachea midline, no stridor , thyroid nl, carotid no bruit Chest - symmetrical excursion ,  unlabored           Heart/CV- RRR , no murmur , no gallop  , no rub, nl s1 s2                           - JVD- none , edema- none, stasis changes- none, varices- none           Lung- unlabored, wheeze- none, cough- none , dullness-none, rub- none,            Chest wall-  Abd-  Br/ Gen/ Rectal- Not done, not indicated Extrem- cyanosis- none,  clubbing, none, atrophy- none, strength- nl Neuro- grossly intact to observation

## 2020-09-10 ENCOUNTER — Ambulatory Visit: Payer: 59 | Admitting: Internal Medicine

## 2020-09-10 ENCOUNTER — Other Ambulatory Visit: Payer: Self-pay

## 2020-09-10 ENCOUNTER — Encounter: Payer: Self-pay | Admitting: Internal Medicine

## 2020-09-10 ENCOUNTER — Other Ambulatory Visit: Payer: Self-pay | Admitting: Internal Medicine

## 2020-09-10 DIAGNOSIS — J3089 Other allergic rhinitis: Secondary | ICD-10-CM

## 2020-09-10 DIAGNOSIS — J302 Other seasonal allergic rhinitis: Secondary | ICD-10-CM | POA: Diagnosis not present

## 2020-09-10 DIAGNOSIS — G4733 Obstructive sleep apnea (adult) (pediatric): Secondary | ICD-10-CM | POA: Diagnosis not present

## 2020-09-10 MED ORDER — FLUTICASONE-SALMETEROL 250-50 MCG/DOSE IN AEPB: INHALATION_SPRAY | RESPIRATORY_TRACT | 12 refills | Status: DC

## 2020-09-10 MED ORDER — MONTELUKAST SODIUM 10 MG PO TABS: 10.0000 mg | ORAL_TABLET | Freq: Every day | ORAL | 3 refills | Status: DC

## 2020-09-10 MED ORDER — ALBUTEROL SULFATE HFA 108 (90 BASE) MCG/ACT IN AERS
INHALATION_SPRAY | RESPIRATORY_TRACT | 12 refills | Status: DC
Start: 2020-09-10 — End: 2020-09-10

## 2020-09-10 MED FILL — ALBUTEROL SULFATE HFA 108 (: 108 (90 BAS | 25 days supply | Qty: 18 | Fill #0

## 2020-09-10 MED FILL — ADVAIR 250/50 DISKUS: 250-50 | 30 days supply | Qty: 60 | Fill #0

## 2020-09-10 NOTE — Assessment & Plan Note (Signed)
Inactive issue now after weight loss.  Plan- routine sleep hygiene guidance. No direct intervention now.

## 2020-09-10 NOTE — Assessment & Plan Note (Signed)
Mild persistent uncomplicated. Doing very well with temperature and allergic triggers noted in past. Plan- refill current meds. We are leaving Epipen and Duoneb on list but not refilling for now.

## 2020-09-10 NOTE — Patient Instructions (Signed)
Refills sent for Singulair, albuterol rescue inhaler and Advair  Please call if we can help

## 2020-09-10 NOTE — Assessment & Plan Note (Signed)
Potential for allergic exacerbation this Spring, but currently dong well. Plan- otc antihistamine and flonase if needed

## 2020-09-14 ENCOUNTER — Other Ambulatory Visit (INDEPENDENT_AMBULATORY_CARE_PROVIDER_SITE_OTHER): Payer: Self-pay | Admitting: Adult Health

## 2020-09-14 ENCOUNTER — Encounter (INDEPENDENT_AMBULATORY_CARE_PROVIDER_SITE_OTHER): Payer: Self-pay | Admitting: Adult Health

## 2020-09-14 ENCOUNTER — Other Ambulatory Visit: Payer: Self-pay

## 2020-09-14 ENCOUNTER — Ambulatory Visit (INDEPENDENT_AMBULATORY_CARE_PROVIDER_SITE_OTHER): Payer: 59 | Admitting: Adult Health

## 2020-09-14 VITALS — BP 113/73 | HR 87 | Temp 98.1°F | Ht 61.0 in | Wt 156.0 lb

## 2020-09-14 DIAGNOSIS — Z6830 Body mass index (BMI) 30.0-30.9, adult: Secondary | ICD-10-CM | POA: Diagnosis not present

## 2020-09-14 DIAGNOSIS — Z9189 Other specified personal risk factors, not elsewhere classified: Secondary | ICD-10-CM | POA: Diagnosis not present

## 2020-09-14 DIAGNOSIS — K760 Fatty (change of) liver, not elsewhere classified: Secondary | ICD-10-CM | POA: Diagnosis not present

## 2020-09-14 DIAGNOSIS — E669 Obesity, unspecified: Secondary | ICD-10-CM

## 2020-09-14 DIAGNOSIS — R7303 Prediabetes: Secondary | ICD-10-CM | POA: Diagnosis not present

## 2020-09-14 MED ORDER — METFORMIN HCL 500 MG PO TABS
500.0000 mg | ORAL_TABLET | Freq: Every day | ORAL | 0 refills | Status: DC
Start: 2020-09-14 — End: 2020-09-14

## 2020-09-15 NOTE — Progress Notes (Signed)
Chief Complaint:   OBESITY Katherine Tanner is here to discuss her progress with her obesity treatment plan along with follow-up of her obesity related diagnoses. Katherine Tanner is on the Category 2 Plan and states she is following her eating plan approximately 100% of the time. Katherine Tanner states she is biking 30 minutes 5 times per week.  Today's visit was #: 59 Starting weight: 198 lbs Starting date: 08/30/2018 Today's weight: 156 lbs Today's date: 09/14/2020 Total lbs lost to date: 42 lbs Total lbs lost since last in-office visit: 1 lb  Interim History: Katherine Tanner is down another 1 lb for a total of 42 lbs! Her current BMI 29.6. She is at her ultimate goal!  When she is busy at work and unable to eat lunch. She will fuel up with water and protein one bars.  Subjective:   1. Pre-diabetes Katherine Tanner's 07/07/2020 CMP resulted GFR >60. Her A1c 5.8 with normal blood glucose 80. She is on Metformin 500 mg daily. She denies GI upset. Highest dose of Metformin 500 mg BID.  Lab Results  Component Value Date   HGBA1C 5.8 07/07/2020   Lab Results  Component Value Date   INSULIN 11.2 03/23/2020   INSULIN 6.2 12/24/2019   INSULIN 4.0 09/11/2019   INSULIN 9.8 05/06/2019   INSULIN 8.8 01/14/2019    2. Nonalcoholic hepatosteatosis 07/05/9415 CMP AST 47 and ALT 73. Katherine Tanner denies RUQ pain or jaundice.   Lab Results  Component Value Date   ALT 73 (H) 07/07/2020   AST 47 (H) 07/07/2020   ALKPHOS 145 (H) 07/07/2020   BILITOT 0.4 07/07/2020    3. At risk for impaired function of liver Amra is at risk for impaired function of liver due to likely diagnosis of fatty liver as evidenced by recent elevated liver enzymes.   Assessment/Plan:   1. Pre-diabetes Katherine Tanner will continue to work on weight loss, exercise, and decreasing simple carbohydrates to help decrease the risk of diabetes.   - metFORMIN (GLUCOPHAGE) 500 MG tablet; Take 1 tablet (500 mg total) by mouth daily.  Dispense: 30 tablet; Refill: 0  2.  Nonalcoholic hepatosteatosis We discussed the likely diagnosis of non-alcoholic fatty liver disease today and how this condition is obesity related. Katherine Tanner was educated the importance of weight loss. Katherine Tanner agreed to continue with her weight loss efforts with healthier diet and exercise as an essential part of her treatment plan. Avoid acetaminophen at ETOH.  3. At risk for impaired function of liver Katherine Tanner was given approximately 15 minutes of counseling today regarding prevention of impaired liver function. Katherine Tanner was educated about her risk of developing NASH or even liver failure and advised that the only proven treatment for NAFLD was weight loss of at least 5-10% of body weight.   4. Class 1 obesity with serious comorbidity and body mass index (BMI) of 30.0 to 30.9 in adult, unspecified obesity type Katherine Tanner is currently in the action stage of change. As such, her goal is to continue with weight loss efforts. She has agreed to the Category 2 Plan.   Fasting/IC at next OV  Exercise goals: As is  Behavioral modification strategies: increasing lean protein intake, decreasing simple carbohydrates, no skipping meals, meal planning and cooking strategies and planning for success.  Katherine Tanner has agreed to follow-up with our clinic in 4 weeks. She was informed of the importance of frequent follow-up visits to maximize her success with intensive lifestyle modifications for her multiple health conditions.   Objective:   Blood pressure 113/73,  pulse 87, temperature 98.1 F (36.7 C), height 5\' 1"  (1.549 m), weight 156 lb (70.8 kg), last menstrual period 02/20/2014, SpO2 100 %. Body mass index is 29.48 kg/m.  General: Cooperative, alert, well developed, in no acute distress. HEENT: Conjunctivae and lids unremarkable. Cardiovascular: Regular rhythm.  Lungs: Normal work of breathing. Neurologic: No focal deficits.   Lab Results  Component Value Date   CREATININE 0.59 07/07/2020   BUN 20 07/07/2020    NA 137 07/07/2020   K 4.1 07/07/2020   CL 98 07/07/2020   CO2 34 (H) 07/07/2020   Lab Results  Component Value Date   ALT 73 (H) 07/07/2020   AST 47 (H) 07/07/2020   ALKPHOS 145 (H) 07/07/2020   BILITOT 0.4 07/07/2020   Lab Results  Component Value Date   HGBA1C 5.8 07/07/2020   HGBA1C 5.7 (H) 03/23/2020   HGBA1C 5.6 12/24/2019   HGBA1C 5.5 09/11/2019   HGBA1C 5.4 05/06/2019   Lab Results  Component Value Date   INSULIN 11.2 03/23/2020   INSULIN 6.2 12/24/2019   INSULIN 4.0 09/11/2019   INSULIN 9.8 05/06/2019   INSULIN 8.8 01/14/2019   Lab Results  Component Value Date   TSH 1.32 07/07/2020   Lab Results  Component Value Date   CHOL 149 07/07/2020   HDL 61.90 07/07/2020   LDLCALC 77 07/07/2020   LDLDIRECT 147.9 12/17/2012   TRIG 53.0 07/07/2020   CHOLHDL 2 07/07/2020   Lab Results  Component Value Date   WBC 6.5 07/07/2020   HGB 12.4 07/07/2020   HCT 37.7 07/07/2020   MCV 87.3 07/07/2020   PLT 91.0 (L) 07/07/2020   Lab Results  Component Value Date   IRON 63 07/07/2020    Attestation Statements:   Reviewed by clinician on day of visit: allergies, medications, problem list, medical history, surgical history, family history, social history, and previous encounter notes.  Coral Ceo, am acting as Location manager for Mina Marble, NP.  I have reviewed the above documentation for accuracy and completeness, and I agree with the above. -  Alee Katen d. Lucresia Simic, NP-C

## 2020-09-23 MED FILL — ROSUVASTATIN CALCIUM 10 MG: 10 | 90 days supply | Qty: 90 | Fill #2

## 2020-09-23 MED FILL — DULoxetine HCL 30 MG CPEP: 30 | 30 days supply | Qty: 60 | Fill #1

## 2020-10-08 ENCOUNTER — Other Ambulatory Visit (HOSPITAL_COMMUNITY): Payer: Self-pay

## 2020-10-12 ENCOUNTER — Other Ambulatory Visit: Payer: Self-pay

## 2020-10-12 ENCOUNTER — Encounter (INDEPENDENT_AMBULATORY_CARE_PROVIDER_SITE_OTHER): Payer: Self-pay | Admitting: Family Medicine

## 2020-10-12 ENCOUNTER — Ambulatory Visit (INDEPENDENT_AMBULATORY_CARE_PROVIDER_SITE_OTHER): Payer: 59 | Admitting: Family Medicine

## 2020-10-12 ENCOUNTER — Other Ambulatory Visit (HOSPITAL_COMMUNITY): Payer: Self-pay

## 2020-10-12 VITALS — BP 136/83 | HR 78 | Temp 98.2°F | Ht 61.0 in | Wt 153.0 lb

## 2020-10-12 DIAGNOSIS — J45909 Unspecified asthma, uncomplicated: Secondary | ICD-10-CM | POA: Diagnosis not present

## 2020-10-12 DIAGNOSIS — R7303 Prediabetes: Secondary | ICD-10-CM | POA: Diagnosis not present

## 2020-10-12 DIAGNOSIS — H52223 Regular astigmatism, bilateral: Secondary | ICD-10-CM | POA: Diagnosis not present

## 2020-10-12 DIAGNOSIS — H04123 Dry eye syndrome of bilateral lacrimal glands: Secondary | ICD-10-CM | POA: Diagnosis not present

## 2020-10-12 DIAGNOSIS — H5203 Hypermetropia, bilateral: Secondary | ICD-10-CM | POA: Diagnosis not present

## 2020-10-12 DIAGNOSIS — E559 Vitamin D deficiency, unspecified: Secondary | ICD-10-CM

## 2020-10-12 DIAGNOSIS — K760 Fatty (change of) liver, not elsewhere classified: Secondary | ICD-10-CM | POA: Diagnosis not present

## 2020-10-12 DIAGNOSIS — Z9189 Other specified personal risk factors, not elsewhere classified: Secondary | ICD-10-CM

## 2020-10-12 DIAGNOSIS — Z83511 Family history of glaucoma: Secondary | ICD-10-CM | POA: Diagnosis not present

## 2020-10-12 DIAGNOSIS — Z6837 Body mass index (BMI) 37.0-37.9, adult: Secondary | ICD-10-CM

## 2020-10-12 DIAGNOSIS — H524 Presbyopia: Secondary | ICD-10-CM | POA: Diagnosis not present

## 2020-10-12 MED ORDER — VITAMIN D3 25 MCG (1000 UT) PO CAPS: 1000.0000 [IU] | ORAL_CAPSULE | Freq: Every day | ORAL | 0 refills | Status: DC

## 2020-10-12 MED ORDER — VITAMIN D 50 MCG (2000 UT) PO CAPS: 1.0000 | ORAL_CAPSULE | Freq: Every day | ORAL | 0 refills | Status: DC

## 2020-10-12 MED ORDER — IPRATROPIUM-ALBUTEROL 0.5-2.5 (3) MG/3ML IN SOLN
RESPIRATORY_TRACT | 5 refills | Status: DC
Start: 2020-10-12 — End: 2022-06-06
  Filled 2020-10-12: qty 180, 15d supply, fill #0

## 2020-10-12 MED ORDER — METFORMIN HCL 500 MG PO TABS
ORAL_TABLET | Freq: Every day | ORAL | 0 refills | Status: DC
  Filled 2020-10-12: qty 30, 30d supply, fill #0

## 2020-10-13 ENCOUNTER — Other Ambulatory Visit (HOSPITAL_COMMUNITY): Payer: Self-pay

## 2020-10-13 LAB — HEMOGLOBIN A1C
Est. average glucose Bld gHb Est-mCnc: 114 mg/dL
Hgb A1c MFr Bld: 5.6 % (ref 4.8–5.6)

## 2020-10-13 LAB — INSULIN, RANDOM: INSULIN: 6 u[IU]/mL (ref 2.6–24.9)

## 2020-10-13 LAB — COMPREHENSIVE METABOLIC PANEL
ALT: 39 IU/L — ABNORMAL HIGH (ref 0–32)
AST: 24 IU/L (ref 0–40)
Albumin/Globulin Ratio: 1.7 (ref 1.2–2.2)
Albumin: 4.3 g/dL (ref 3.8–4.9)
Alkaline Phosphatase: 140 IU/L — ABNORMAL HIGH (ref 44–121)
BUN/Creatinine Ratio: 20 (ref 9–23)
BUN: 13 mg/dL (ref 6–24)
Bilirubin Total: 0.4 mg/dL (ref 0.0–1.2)
CO2: 25 mmol/L (ref 20–29)
Calcium: 9.8 mg/dL (ref 8.7–10.2)
Chloride: 102 mmol/L (ref 96–106)
Creatinine, Ser: 0.65 mg/dL (ref 0.57–1.00)
Globulin, Total: 2.6 g/dL (ref 1.5–4.5)
Glucose: 95 mg/dL (ref 65–99)
Potassium: 4.4 mmol/L (ref 3.5–5.2)
Sodium: 143 mmol/L (ref 134–144)
Total Protein: 6.9 g/dL (ref 6.0–8.5)
eGFR: 102 mL/min/{1.73_m2} (ref >59)

## 2020-10-13 LAB — VITAMIN D 25 HYDROXY (VIT D DEFICIENCY, FRACTURES): Vit D, 25-Hydroxy: 40.9 ng/mL (ref 30.0–100.0)

## 2020-10-13 NOTE — Progress Notes (Signed)
Chief Complaint:   OBESITY Katherine Tanner is here to discuss her progress with her obesity treatment plan along with follow-up of her obesity related diagnoses. Katherine Tanner is on the Category 2 Plan and states she is following her eating plan approximately 100% of the time. Katherine Tanner states she is biking and weight training 30 minutes 5 times per week.  Today's visit was #: 22 Starting weight: 198 lbs Starting date: 08/30/2018 Today's weight: 153 lbs Today's date: 10/12/2020 Total lbs lost to date: 45 lbs Total lbs lost since last in-office visit: 3  Interim History: Katherine Tanner is at her goal weight of 155 (153 lbs today). We repeated IC today and RMR is 1227 (down 256 kcal from IC of 1483 done last year). She says she would like to stick with a structured meal plan. She has added in weight training last week.  Subjective:   1. Vitamin D deficiency Katherine Tanner's Vit D is slightly low at 40. She is on OTC Vit D 1,000 IU daily supplementation.  2. Nonalcoholic hepatosteatosis Katherine Tanner's last LFT's were elevated: AST 47 and ALT 73. She does not drink alcohol. LFT's increased at last check despite weight loss.  3. Pre-diabetes Katherine Tanner's A1c is elevated at 5.8. She is on Metformin 500 mg QD.  4. Asthma, unspecified asthma severity, unspecified whether complicated, unspecified whether persistent Katherine Tanner is stable. She is on Advair, Singulair, and albuterol prn.  5. At risk for impaired metabolic function Katherine Tanner is at risk for impaired function of liver due to decreased RMR.  Assessment/Plan:   1. Vitamin D deficiency . She agrees to continue to take OTC Vitamin D @1 ,000 IU daily and will follow-up for routine testing of Vitamin D, at least 2-3 times per year to avoid over-replacement. Check labs today.  - VITAMIN D 25 Hydroxy (Vit-D Deficiency, Fractures) - Cholecalciferol (VITAMIN D3) 25 MCG (1000 UT) CAPS; Take 1 capsule (1,000 Units total) by mouth daily.  Dispense: 30 capsule; Refill: 0  2.  Nonalcoholic hepatosteatosis  Check labs today.  - Comprehensive metabolic panel  3. Pre-diabetes Roan will continue to work on weight loss, exercise, and decreasing simple carbohydrates to help decrease the risk of diabetes. Check labs today.  - Hemoglobin A1c - Insulin, random - metFORMIN (GLUCOPHAGE) 500 MG tablet; TAKE 1 TABLET (500 MG TOTAL) BY MOUTH DAILY.  Dispense: 30 tablet; Refill: 0  4. Asthma, unspecified asthma severity, unspecified whether complicated, unspecified whether persistent IC today. Continue current treatment plan.  5. At risk for impaired metabolic function Katherine Tanner was given approximately 15 minutes of impaired  metabolic function prevention counseling today. We discussed intensive lifestyle modifications today with an emphasis on specific nutrition and exercise instructions and strategies. Increase strength training.  Repetitive spaced learning was employed today to elicit superior memory formation and behavioral change.  6. Overweight: BMI 31 Katherine Tanner is currently in the action stage of change. As such, her goal is to maintain weight for now. She has agreed to the Category 3 Plan and keeping a food journal and adhering to recommended goals of 250-350 calories and 25 protein with breakfast.   Change to category for weight maintenance.  Exercise goals: As is  Behavioral modification strategies: meal planning and cooking strategies.  Katherine Tanner has agreed to follow-up with our clinic in 4 weeks Katherine Tanner was informed we would discuss her lab results at her next visit unless there is a critical issue that needs to be addressed sooner. Katherine Tanner agreed to keep her next visit at the agreed upon  time to discuss these results.  Objective:   Blood pressure 136/83, pulse 78, temperature 98.2 F (36.8 C), height 5\' 1"  (1.549 m), weight 153 lb (69.4 kg), last menstrual period 02/20/2014, SpO2 98 %. Body mass index is 28.91 kg/m.  General: Cooperative, alert, well developed,  in no acute distress. HEENT: Conjunctivae and lids unremarkable. Cardiovascular: Regular rhythm.  Lungs: Normal work of breathing. Neurologic: No focal deficits.   Lab Results  Component Value Date   CREATININE 0.65 10/12/2020   BUN 13 10/12/2020   NA 143 10/12/2020   K 4.4 10/12/2020   CL 102 10/12/2020   CO2 25 10/12/2020   Lab Results  Component Value Date   ALT 39 (H) 10/12/2020   AST 24 10/12/2020   ALKPHOS 140 (H) 10/12/2020   BILITOT 0.4 10/12/2020   Lab Results  Component Value Date   HGBA1C 5.6 10/12/2020   HGBA1C 5.8 07/07/2020   HGBA1C 5.7 (H) 03/23/2020   HGBA1C 5.6 12/24/2019   HGBA1C 5.5 09/11/2019   Lab Results  Component Value Date   INSULIN 6.0 10/12/2020   INSULIN 11.2 03/23/2020   INSULIN 6.2 12/24/2019   INSULIN 4.0 09/11/2019   INSULIN 9.8 05/06/2019   Lab Results  Component Value Date   TSH 1.32 07/07/2020   Lab Results  Component Value Date   CHOL 149 07/07/2020   HDL 61.90 07/07/2020   LDLCALC 77 07/07/2020   LDLDIRECT 147.9 12/17/2012   TRIG 53.0 07/07/2020   CHOLHDL 2 07/07/2020   Lab Results  Component Value Date   WBC 6.5 07/07/2020   HGB 12.4 07/07/2020   HCT 37.7 07/07/2020   MCV 87.3 07/07/2020   PLT 91.0 (L) 07/07/2020   Lab Results  Component Value Date   IRON 63 07/07/2020    Attestation Statements:   Reviewed by clinician on day of visit: allergies, medications, problem list, medical history, surgical history, family history, social history, and previous encounter notes.  Coral Ceo, am acting as Location manager for [employer], [place].  I have reviewed the above documentation for accuracy and completeness, and I agree with the above. -  Georgianne Fick, FNP

## 2020-10-14 ENCOUNTER — Encounter (INDEPENDENT_AMBULATORY_CARE_PROVIDER_SITE_OTHER): Payer: Self-pay | Admitting: Family Medicine

## 2020-10-18 MED FILL — Fluticasone-Salmeterol Aer Powder BA 250-50 MCG/ACT: RESPIRATORY_TRACT | 30 days supply | Qty: 60 | Fill #0 | Status: AC

## 2020-10-18 MED FILL — Baclofen Tab 10 MG: ORAL | 30 days supply | Qty: 60 | Fill #0 | Status: AC

## 2020-10-19 ENCOUNTER — Other Ambulatory Visit: Payer: Self-pay | Admitting: Physical Medicine and Rehabilitation

## 2020-10-19 ENCOUNTER — Other Ambulatory Visit (HOSPITAL_COMMUNITY): Payer: Self-pay

## 2020-10-20 ENCOUNTER — Other Ambulatory Visit (HOSPITAL_COMMUNITY): Payer: Self-pay

## 2020-10-20 MED ORDER — TRAMADOL HCL 50 MG PO TABS
ORAL_TABLET | ORAL | 0 refills | Status: DC
  Filled 2020-10-20: qty 90, 23d supply, fill #0

## 2020-10-23 ENCOUNTER — Other Ambulatory Visit (HOSPITAL_COMMUNITY): Payer: Self-pay

## 2020-10-29 ENCOUNTER — Other Ambulatory Visit (HOSPITAL_COMMUNITY): Payer: Self-pay

## 2020-10-29 DIAGNOSIS — G4700 Insomnia, unspecified: Secondary | ICD-10-CM | POA: Diagnosis not present

## 2020-10-29 DIAGNOSIS — G894 Chronic pain syndrome: Secondary | ICD-10-CM | POA: Diagnosis not present

## 2020-10-29 DIAGNOSIS — M25569 Pain in unspecified knee: Secondary | ICD-10-CM | POA: Diagnosis not present

## 2020-10-29 DIAGNOSIS — M7552 Bursitis of left shoulder: Secondary | ICD-10-CM | POA: Diagnosis not present

## 2020-10-29 MED ORDER — DICLOFENAC SODIUM 1 % EX GEL
CUTANEOUS | 5 refills | Status: DC
  Filled 2020-10-29: qty 1000, 30d supply, fill #0

## 2020-10-29 MED ORDER — TRAMADOL HCL 50 MG PO TABS
50.0000 mg | ORAL_TABLET | Freq: Two times a day (BID) | ORAL | 1 refills | Status: DC | PRN
  Filled 2020-10-29: qty 120, 30d supply, fill #0

## 2020-10-29 MED ORDER — DULOXETINE HCL 30 MG PO CPEP
30.0000 mg | ORAL_CAPSULE | Freq: Two times a day (BID) | ORAL | 1 refills | Status: DC
  Filled 2020-10-29: qty 60, 30d supply, fill #0
  Filled 2020-11-23: qty 60, 30d supply, fill #1

## 2020-10-29 MED ORDER — BACLOFEN 10 MG PO TABS
10.0000 mg | ORAL_TABLET | Freq: Two times a day (BID) | ORAL | 3 refills | Status: DC | PRN
  Filled 2020-10-29: qty 60, 30d supply, fill #0
  Filled 2020-12-24: qty 60, 30d supply, fill #1
  Filled 2021-01-26: qty 60, 30d supply, fill #2

## 2020-10-30 ENCOUNTER — Other Ambulatory Visit (HOSPITAL_COMMUNITY): Payer: Self-pay

## 2020-10-30 MED FILL — Montelukast Sodium Tab 10 MG (Base Equiv): ORAL | 90 days supply | Qty: 90 | Fill #0 | Status: AC

## 2020-11-04 ENCOUNTER — Ambulatory Visit (INDEPENDENT_AMBULATORY_CARE_PROVIDER_SITE_OTHER): Payer: 59 | Admitting: Adult Health

## 2020-11-04 ENCOUNTER — Other Ambulatory Visit: Payer: Self-pay

## 2020-11-04 ENCOUNTER — Other Ambulatory Visit (HOSPITAL_COMMUNITY): Payer: Self-pay

## 2020-11-04 ENCOUNTER — Encounter (INDEPENDENT_AMBULATORY_CARE_PROVIDER_SITE_OTHER): Payer: Self-pay | Admitting: Adult Health

## 2020-11-04 VITALS — BP 128/70 | HR 69 | Temp 98.1°F | Ht 61.0 in | Wt 155.0 lb

## 2020-11-04 DIAGNOSIS — R7303 Prediabetes: Secondary | ICD-10-CM

## 2020-11-04 DIAGNOSIS — Z6834 Body mass index (BMI) 34.0-34.9, adult: Secondary | ICD-10-CM | POA: Diagnosis not present

## 2020-11-04 DIAGNOSIS — Z9189 Other specified personal risk factors, not elsewhere classified: Secondary | ICD-10-CM

## 2020-11-04 DIAGNOSIS — E669 Obesity, unspecified: Secondary | ICD-10-CM | POA: Diagnosis not present

## 2020-11-04 DIAGNOSIS — R7401 Elevation of levels of liver transaminase levels: Secondary | ICD-10-CM | POA: Diagnosis not present

## 2020-11-04 DIAGNOSIS — E559 Vitamin D deficiency, unspecified: Secondary | ICD-10-CM

## 2020-11-04 DIAGNOSIS — E66811 Obesity, class 1: Secondary | ICD-10-CM

## 2020-11-04 MED ORDER — METFORMIN HCL 500 MG PO TABS
ORAL_TABLET | Freq: Every day | ORAL | 0 refills | Status: DC
  Filled 2020-11-04: qty 30, 30d supply, fill #0

## 2020-11-05 ENCOUNTER — Other Ambulatory Visit (HOSPITAL_COMMUNITY): Payer: Self-pay

## 2020-11-05 NOTE — Progress Notes (Signed)
Chief Complaint:   OBESITY Katherine Tanner is here to discuss her progress with her obesity treatment plan along with follow-up of her obesity related diagnoses. Katherine Tanner is on the Category 2 Plan and keeping a food journal and adhering to recommended goals of 250-350 calories and 25 g protein and states she is following her eating plan approximately 100% of the time. Katherine Tanner states she is doing cardio and weight training 30 minutes 3 times per week.  Today's visit was #: 66 Starting weight: 198 lbs Starting date: 08/30/2018 Today's weight: 155 lbs Today's date: 11/04/2020 Total lbs lost to date: 43 lbs Total lbs lost since last in-office visit: 0  Interim History: Katherine Tanner has incorporated weigh training with her exercise program. She has maintained weight- current BMI 29.3. She reports an increase in energy levels and strength the over the last several months.  Subjective:   1. Pre-diabetes Discussed labs with patient today. 10/12/2020 A1c 5.6 decrease from 5.8 on 07/07/2020. BG 96 and insulin level 6.0, both stable.  Pt is taking Metformin 500 mg with dinner, which has decreased early morning polyphagia.   Lab Results  Component Value Date   HGBA1C 5.6 10/12/2020   Lab Results  Component Value Date   INSULIN 6.0 10/12/2020   INSULIN 11.2 03/23/2020   INSULIN 6.2 12/24/2019   INSULIN 4.0 09/11/2019   INSULIN 9.8 05/06/2019   2. Transaminitis Discussed labs with patient today. 10/12/2020 CMP revealed LFTs all improved.  Pt has dramatically reduced acetaminophen.  She denies any ETOH use.  3. Vitamin D deficiency [name]'s Vitamin D level was 40.9 on 10/12/2020. She is currently taking OTC vitamin D 1,000 IU each day. She denies nausea, vomiting or muscle weakness.  4. At risk for diarrhea Katherine Tanner is at higher risk of diarrhea due to Metformin for pre-diabetes.  Assessment/Plan:   1. Pre-diabetes Sheri will continue to work on weight loss, exercise, and decreasing simple carbohydrates  to help decrease the risk of diabetes.   - metFORMIN (GLUCOPHAGE) 500 MG tablet; TAKE 1 TABLET (500 MG TOTAL) BY MOUTH DAILY.  Dispense: 30 tablet; Refill: 0  2. Transaminitis Continue to avoid hepatotoxic substances.  3. Vitamin D deficiency Low Vitamin D level contributes to fatigue and are associated with obesity, breast, and colon cancer. She agrees to continue to take OTC Vitamin D @1 ,000 IU daily and will follow-up for routine testing of Vitamin D, at least 2-3 times per year to avoid over-replacement.  4. At risk for diarrhea Katherine Tanner was given approximately 15 minutes of diarrhea prevention counseling today. She is 60 y.o. female and has risk factors for diarrhea including medications and changes in diet. We discussed intensive lifestyle modifications today with an emphasis on specific weight loss instructions including dietary strategies.   Repetitive spaced learning was employed today to elicit superior memory formation and behavioral change.  5. Obesity with current BMI 29.3  Katherine Tanner is currently in the action stage of change. As such, her goal is to continue with weight loss efforts. She has agreed to the Category 2 Plan.   Try to consume at least 8 oz of protein with dinner.  Exercise goals: As is  Behavioral modification strategies: increasing lean protein intake, meal planning and cooking strategies and planning for success.  Katherine Tanner has agreed to follow-up with our clinic in 4 weeks. She was informed of the importance of frequent follow-up visits to maximize her success with intensive lifestyle modifications for her multiple health conditions.   Objective:  Blood pressure 128/70, pulse 69, temperature 98.1 F (36.7 C), height 5\' 1"  (1.549 m), weight 155 lb (70.3 kg), last menstrual period 02/20/2014, SpO2 100 %. Body mass index is 29.29 kg/m.  General: Cooperative, alert, well developed, in no acute distress. HEENT: Conjunctivae and lids unremarkable. Cardiovascular:  Regular rhythm.  Lungs: Normal work of breathing. Neurologic: No focal deficits.   Lab Results  Component Value Date   CREATININE 0.65 10/12/2020   BUN 13 10/12/2020   NA 143 10/12/2020   K 4.4 10/12/2020   CL 102 10/12/2020   CO2 25 10/12/2020   Lab Results  Component Value Date   ALT 39 (H) 10/12/2020   AST 24 10/12/2020   ALKPHOS 140 (H) 10/12/2020   BILITOT 0.4 10/12/2020   Lab Results  Component Value Date   HGBA1C 5.6 10/12/2020   HGBA1C 5.8 07/07/2020   HGBA1C 5.7 (H) 03/23/2020   HGBA1C 5.6 12/24/2019   HGBA1C 5.5 09/11/2019   Lab Results  Component Value Date   INSULIN 6.0 10/12/2020   INSULIN 11.2 03/23/2020   INSULIN 6.2 12/24/2019   INSULIN 4.0 09/11/2019   INSULIN 9.8 05/06/2019   Lab Results  Component Value Date   TSH 1.32 07/07/2020   Lab Results  Component Value Date   CHOL 149 07/07/2020   HDL 61.90 07/07/2020   LDLCALC 77 07/07/2020   LDLDIRECT 147.9 12/17/2012   TRIG 53.0 07/07/2020   CHOLHDL 2 07/07/2020   Lab Results  Component Value Date   WBC 6.5 07/07/2020   HGB 12.4 07/07/2020   HCT 37.7 07/07/2020   MCV 87.3 07/07/2020   PLT 91.0 (L) 07/07/2020   Lab Results  Component Value Date   IRON 63 07/07/2020     Attestation Statements:   Reviewed by clinician on day of visit: allergies, medications, problem list, medical history, surgical history, family history, social history, and previous encounter notes.  Coral Ceo, CMA, am acting as transcriptionist for Mina Marble, NP.  I have reviewed the above documentation for accuracy and completeness, and I agree with the above. -  Shanikqua Zarzycki d. Yazeed Pryer, NP-C

## 2020-11-07 ENCOUNTER — Other Ambulatory Visit (HOSPITAL_COMMUNITY): Payer: Self-pay

## 2020-11-09 ENCOUNTER — Other Ambulatory Visit (HOSPITAL_COMMUNITY): Payer: Self-pay

## 2020-11-12 ENCOUNTER — Other Ambulatory Visit (HOSPITAL_COMMUNITY): Payer: Self-pay

## 2020-11-23 ENCOUNTER — Other Ambulatory Visit (HOSPITAL_COMMUNITY): Payer: Self-pay

## 2020-11-23 MED ORDER — FLUTICASONE-SALMETEROL 250-50 MCG/ACT IN AEPB
INHALATION_SPRAY | RESPIRATORY_TRACT | 10 refills | Status: DC
  Filled 2020-11-23: qty 60, 30d supply, fill #0
  Filled 2020-12-17: qty 60, 30d supply, fill #1
  Filled 2021-01-20: qty 60, 30d supply, fill #2
  Filled 2021-02-22: qty 60, 30d supply, fill #3
  Filled 2021-03-25: qty 60, 30d supply, fill #4
  Filled 2021-04-23: qty 60, 30d supply, fill #5
  Filled 2021-05-24 – 2021-06-13 (×2): qty 60, 30d supply, fill #6
  Filled 2021-07-13: qty 60, 30d supply, fill #7

## 2020-11-24 DIAGNOSIS — M76821 Posterior tibial tendinitis, right leg: Secondary | ICD-10-CM | POA: Diagnosis not present

## 2020-11-24 DIAGNOSIS — M25571 Pain in right ankle and joints of right foot: Secondary | ICD-10-CM | POA: Diagnosis not present

## 2020-11-25 ENCOUNTER — Other Ambulatory Visit: Payer: Self-pay

## 2020-11-25 ENCOUNTER — Inpatient Hospital Stay: Payer: 59 | Admitting: Oncology

## 2020-11-25 ENCOUNTER — Inpatient Hospital Stay: Payer: 59 | Attending: Oncology

## 2020-11-25 VITALS — BP 143/77 | HR 104 | Temp 97.3°F | Resp 17 | Ht 61.0 in | Wt 156.3 lb

## 2020-11-25 DIAGNOSIS — D696 Thrombocytopenia, unspecified: Secondary | ICD-10-CM | POA: Diagnosis not present

## 2020-11-25 DIAGNOSIS — Z7951 Long term (current) use of inhaled steroids: Secondary | ICD-10-CM | POA: Insufficient documentation

## 2020-11-25 DIAGNOSIS — Z79899 Other long term (current) drug therapy: Secondary | ICD-10-CM | POA: Insufficient documentation

## 2020-11-25 DIAGNOSIS — Z7984 Long term (current) use of oral hypoglycemic drugs: Secondary | ICD-10-CM | POA: Diagnosis not present

## 2020-11-25 LAB — CBC WITH DIFFERENTIAL (CANCER CENTER ONLY)
Abs Immature Granulocytes: 0.02 10*3/uL (ref 0.00–0.07)
Basophils Absolute: 0 10*3/uL (ref 0.0–0.1)
Basophils Relative: 0 %
Eosinophils Absolute: 0 10*3/uL (ref 0.0–0.5)
Eosinophils Relative: 0 %
HCT: 40.7 % (ref 36.0–46.0)
Hemoglobin: 13.3 g/dL (ref 12.0–15.0)
Immature Granulocytes: 0 %
Lymphocytes Relative: 21 %
Lymphs Abs: 1.1 10*3/uL (ref 0.7–4.0)
MCH: 28.5 pg (ref 26.0–34.0)
MCHC: 32.7 g/dL (ref 30.0–36.0)
MCV: 87.2 fL (ref 80.0–100.0)
Monocytes Absolute: 0.3 10*3/uL (ref 0.1–1.0)
Monocytes Relative: 6 %
Neutro Abs: 4.1 10*3/uL (ref 1.7–7.7)
Neutrophils Relative %: 73 %
Platelet Count: 154 10*3/uL (ref 150–400)
RBC: 4.67 MIL/uL (ref 3.87–5.11)
RDW: 13.2 % (ref 11.5–15.5)
WBC Count: 5.6 10*3/uL (ref 4.0–10.5)
nRBC: 0 % (ref 0.0–0.2)

## 2020-11-25 NOTE — Progress Notes (Signed)
Hematology and Oncology Follow Up Visit  Katherine Tanner [id number] 1961-05-23 60 y.o. 11/25/2020 9:49 AM Tower, Katherine Tanner, MDTower, Katherine Fanny, MD   Principle Diagnosis: 60 year old woman with thrombocytopenia diagnosed in 2019.  She was found to have platelet count of 91 in January 2022.  Etiology is unclear with autoimmune versus reactive thrombocytopenia.     Current therapy: Active surveillance.  Interim History: [name] returns today for a follow-up visit.  Since her last visit, she reports no major changes in her health.  She continues to feel well without any complaints.  She denies any nausea, fatigue or bleeding complications.  She denies any hematochezia or melena or easy bruising.     Medications: I have reviewed the patient's current medications.  Current Outpatient Medications  Medication Sig Dispense Refill  . albuterol (VENTOLIN HFA) 108 (90 Base) MCG/ACT inhaler INHALE 2 PUFFS BY MOUTH INTO THE LUNGS EVERY 6 HOURS AS NEEDED FOR WHEEZING OR SHORTNESS OF BREATH. 18 g 12  . baclofen (LIORESAL) 10 MG tablet Take 1 tablet (10 mg total) by mouth 2 (two) times daily as needed. 60 tablet 3  . Calcium Citrate-Vitamin D (CALCIUM + D PO) Take 1 tablet by mouth daily.    . Cholecalciferol (VITAMIN D3) 25 MCG (1000 UT) CAPS Take 1 capsule (1,000 Units total) by mouth daily. 30 capsule 0  . diclofenac Sodium (VOLTAREN) 1 % GEL Apply 4 grams to skin four times a day 960 g 5  . DULoxetine (CYMBALTA) 30 MG capsule Take 1 capsule (30 mg total) by mouth 2 (two) times daily. 60 capsule 1  . EPINEPHRINE 0.3 mg/0.3 mL IJ SOAJ injection INJECT 1 SYRINGE INTO THE MUSCLE ONCE 1 each 1  . fluticasone (FLONASE) 50 MCG/ACT nasal spray Place 2 sprays into both nostrils daily. 48 g 3  . fluticasone-salmeterol (ADVAIR DISKUS) 250-50 MCG/ACT AEPB Inhale 1 puff into the lungs 2 times daily then rinse mouth 60 each 10  . ipratropium-albuterol (DUONEB) 0.5-2.5 (3) MG/3ML SOLN USE 1 VIAL  VIA NEBULIZER EVERY 6 HOURS AS NEEDED 90 mL 5  . loratadine (CLARITIN) 10 MG tablet Take 10 mg by mouth daily.    [name] losartan-hydrochlorothiazide (HYZAAR) 100-25 MG tablet TAKE 1 TABLET BY MOUTH ONCE DAILY (Patient taking differently: Take 0.5 tablets by mouth daily. 1/2 tab qd) 90 tablet 1  . metFORMIN (GLUCOPHAGE) 500 MG tablet TAKE 1 TABLET (500 MG TOTAL) BY MOUTH DAILY. 30 tablet 0  . montelukast (SINGULAIR) 10 MG tablet TAKE 1 TABLET BY MOUTH AT BEDTIME 90 tablet 3  . Multiple Vitamin (MULTIVITAMIN PO) Take 1 tablet by mouth daily.    . naloxone (NARCAN) nasal spray 4 mg/0.1 mL INSTILL 1 SPRAY INTO THE NOSE AS NEEDED FOR ACCIDENTAL OVERDOSE 2 each 0  . Nebulizers (COMPRESSOR/NEBULIZER) MISC Use as directed 1 each 0  . rosuvastatin (CRESTOR) 10 MG tablet TAKE 1 TABLET BY MOUTH DAILY 90 tablet 3  . traMADol (ULTRAM) 50 MG tablet Take 1-2 tablets (50-100 mg total) by mouth 2 (two) times daily as needed for pain 120 tablet 1  . baclofen (LIORESAL) 10 MG tablet TAKE 1 TABLET BY MOUTH 2 TIMES DAILY AS NEEDED 60 tablet 3  . DULoxetine (CYMBALTA) 30 MG capsule TAKE 1 CAPSULE BY MOUTH TWICE DAILY 60 capsule 1  . traMADol (ULTRAM) 50 MG tablet Take 50 mg by mouth daily.      No current facility-administered medications for this visit.     Allergies:  Allergies  Allergen Reactions  .  Ace Inhibitors Shortness Of Breath and Cough    Cough/ breathing problems   . Amoxicillin-Pot Clavulanate Swelling    SWELLING REACTION UNSPECIFIED   . Influenza Virus Vacc Split Pf     UNSPECIFIED REACTION   . Shellfish Allergy Itching      Physical Exam: Blood pressure (!) 143/77, pulse (!) 104, temperature (!) 97.3 F (36.3 C), temperature source Tympanic, resp. rate 17, height 5\' 1"  (1.549 m), weight 156 lb 4.8 oz (70.9 kg), last menstrual period 02/20/2014, SpO2 100 %. ECOG: 0    General appearance: Comfortable appearing without any discomfort Head: Normocephalic without any trauma Oropharynx:  Mucous membranes are moist and pink without any thrush or ulcers. Eyes: Pupils are equal and round reactive to light. Lymph nodes: No cervical, supraclavicular, inguinal or axillary lymphadenopathy.   Heart:regular rate and rhythm.  S1 and S2 without leg edema. Lung: Clear without any rhonchi or wheezes.  No dullness to percussion. Abdomin: Soft, nontender, nondistended with good bowel sounds.  No hepatosplenomegaly. Musculoskeletal: No joint deformity or effusion.  Full range of motion noted. Neurological: No deficits noted on motor, sensory and deep tendon reflex exam. Skin: No petechial rash or dryness.  Appeared moist.      Lab Results: Lab Results  Component Value Date   WBC 5.6 11/25/2020   HGB 13.3 11/25/2020   HCT 40.7 11/25/2020   MCV 87.2 11/25/2020   PLT 154 11/25/2020     Chemistry      Component Value Date/Time   NA 143 10/12/2020 0852   K 4.4 10/12/2020 0852   CL 102 10/12/2020 0852   CO2 25 10/12/2020 0852   BUN 13 10/12/2020 0852   CREATININE 0.65 10/12/2020 0852      Component Value Date/Time   CALCIUM 9.8 10/12/2020 0852   ALKPHOS 140 (H) 10/12/2020 0852   AST 24 10/12/2020 0852   ALT 39 (H) 10/12/2020 0852   BILITOT 0.4 10/12/2020 0852        Impression and Plan:   60 year old woman with:  1.    Mild fluctuating thrombocytopenia diagnosed in 2019.  The differential diagnosis including autoimmune versus reactive thrombocytopenia.  Laboratory data from today reviewed and showed a platelet count of 154.  Her CBC was personally reviewed and discussed with her and showed normal white cell count, hemoglobin as well as normalization of her platelets.  Other consideration for the fluctuating platelet count including collection issues with clumping versus a reactive causes to her thrombocytopenia.  Other etiologies such as bone marrow disorder, lymphoproliferative disorder are unlikely at this time.  From a management standpoint, she does not require  any intervention at this time given the normal platelet count.  We will reassess this differential if her platelet count persistently drops below 100.  She does not require any treatment including steroids or any other treatment.   3.  Follow-up: I am happy to see her in the future as needed.  30  minutes were spent on this encounter.  The time was dedicated to reviewing laboratory data, discussing differential diagnosis and management options for the future.    Zola Button, MD 5/25/20229:49 AM \

## 2020-12-02 ENCOUNTER — Other Ambulatory Visit: Payer: Self-pay

## 2020-12-02 ENCOUNTER — Other Ambulatory Visit (HOSPITAL_COMMUNITY): Payer: Self-pay

## 2020-12-02 ENCOUNTER — Encounter (INDEPENDENT_AMBULATORY_CARE_PROVIDER_SITE_OTHER): Payer: Self-pay | Admitting: Adult Health

## 2020-12-02 ENCOUNTER — Ambulatory Visit (INDEPENDENT_AMBULATORY_CARE_PROVIDER_SITE_OTHER): Payer: 59 | Admitting: Adult Health

## 2020-12-02 VITALS — BP 116/68 | HR 74 | Temp 98.6°F | Ht 61.0 in | Wt 155.0 lb

## 2020-12-02 DIAGNOSIS — Z6834 Body mass index (BMI) 34.0-34.9, adult: Secondary | ICD-10-CM

## 2020-12-02 DIAGNOSIS — E669 Obesity, unspecified: Secondary | ICD-10-CM | POA: Diagnosis not present

## 2020-12-02 DIAGNOSIS — D696 Thrombocytopenia, unspecified: Secondary | ICD-10-CM | POA: Diagnosis not present

## 2020-12-02 DIAGNOSIS — Z9189 Other specified personal risk factors, not elsewhere classified: Secondary | ICD-10-CM | POA: Diagnosis not present

## 2020-12-02 DIAGNOSIS — R7303 Prediabetes: Secondary | ICD-10-CM | POA: Diagnosis not present

## 2020-12-02 MED ORDER — METFORMIN HCL 500 MG PO TABS
ORAL_TABLET | Freq: Every day | ORAL | 0 refills | Status: DC
  Filled 2020-12-02: qty 30, 30d supply, fill #0

## 2020-12-07 NOTE — Progress Notes (Signed)
Chief Complaint:   OBESITY Katherine Tanner is here to discuss her progress with her obesity treatment plan along with follow-up of her obesity related diagnoses. Katherine Tanner is on the Category 2 Plan and states she is following her eating plan approximately 100% of the time. Katherine Tanner states she is weight training and biking 30 minutes 5 times per week.  Today's visit was #: 73 Starting weight: 198 lbs Starting date: 08/30/2018 Today's weight: 155 lbs Today's date: 12/02/2020 Total lbs lost to date: 43 Total lbs lost since last in-office visit: 0  Interim History: Katherine Tanner had a recent OV at oncology- labs are stable- f/u PRN (re: Thrombocytopenia) She will travel to [facility] for 7 days with her husband- 29th wedding anniversary.  Subjective:   1. Pre-diabetes 10/12/2020 CMP GFR 107 and A1c 5.6.  Isla will take Metformin 500 mg with lunch- polyphagia is well controlled.  2. Thrombocytopenia ([place]) Katherine Tanner was recently seen by oncology 11/25/2020- labs and differential discussed. No acute complaints at time of OV. Most recent platelet count 154. Reassess differential is PLT <100. She will follow up with oncology PRN.  3. At risk for diarrhea Katherine Tanner is at higher risk of diarrhea due to Metformin for pre-diabetes.  Assessment/Plan:   1. Pre-diabetes [place] will continue to work on weight loss, exercise, and decreasing simple carbohydrates to help decrease the risk of diabetes.  - metFORMIN (GLUCOPHAGE) 500 MG tablet; TAKE 1 TABLET (500 MG TOTAL) BY MOUTH DAILY.  Dispense: 30 tablet; Refill: 0  2. Thrombocytopenia ([place]) Monitor labs every 3 months or if symptoms develop.  3. At risk for diarrhea Katherine Tanner was given approximately 15 minutes of diarrhea prevention counseling today. She is 60 y.o. female and has risk factors for diarrhea including medications and changes in diet. We discussed intensive lifestyle modifications today with an emphasis on specific weight loss instructions including  dietary strategies.   Repetitive spaced learning was employed today to elicit superior memory formation and behavioral change.  4. Obesity with current BMI 29.4 Katherine Tanner is currently in the action stage of change. As such, her goal is to continue with weight loss efforts. She has agreed to the Category 2 Plan.   Exercise goals: As is  Behavioral modification strategies: increasing lean protein intake, decreasing simple carbohydrates, meal planning and cooking strategies, keeping healthy foods in the home, travel eating strategies and celebration eating strategies.  Katherine Tanner has agreed to follow-up with our clinic in 4 weeks. She was informed of the importance of frequent follow-up visits to maximize her success with intensive lifestyle modifications for her multiple health conditions.   Objective:   Blood pressure 116/68, pulse 74, temperature 98.6 F (37 C), height 5\' 1"  (1.549 m), weight 155 lb (70.3 kg), last menstrual period 02/20/2014, SpO2 100 %. Body mass index is 29.29 kg/m.  General: Cooperative, alert, well developed, in no acute distress. HEENT: Conjunctivae and lids unremarkable. Cardiovascular: Regular rhythm.  Lungs: Normal work of breathing. Neurologic: No focal deficits.   Lab Results  Component Value Date   CREATININE 0.65 10/12/2020   BUN 13 10/12/2020   NA 143 10/12/2020   K 4.4 10/12/2020   CL 102 10/12/2020   CO2 25 10/12/2020   Lab Results  Component Value Date   ALT 39 (H) 10/12/2020   AST 24 10/12/2020   ALKPHOS 140 (H) 10/12/2020   BILITOT 0.4 10/12/2020   Lab Results  Component Value Date   HGBA1C 5.6 10/12/2020   HGBA1C 5.8 07/07/2020   HGBA1C  5.7 (H) 03/23/2020   HGBA1C 5.6 12/24/2019   HGBA1C 5.5 09/11/2019   Lab Results  Component Value Date   INSULIN 6.0 10/12/2020   INSULIN 11.2 03/23/2020   INSULIN 6.2 12/24/2019   INSULIN 4.0 09/11/2019   INSULIN 9.8 05/06/2019   Lab Results  Component Value Date   TSH 1.32 07/07/2020   Lab  Results  Component Value Date   CHOL 149 07/07/2020   HDL 61.90 07/07/2020   LDLCALC 77 07/07/2020   LDLDIRECT 147.9 12/17/2012   TRIG 53.0 07/07/2020   CHOLHDL 2 07/07/2020   Lab Results  Component Value Date   WBC 5.6 11/25/2020   HGB 13.3 11/25/2020   HCT 40.7 11/25/2020   MCV 87.2 11/25/2020   PLT 154 11/25/2020   Lab Results  Component Value Date   IRON 63 07/07/2020     Attestation Statements:   Reviewed by clinician on day of visit: allergies, medications, problem list, medical history, surgical history, family history, social history, and previous encounter notes.  Coral Ceo, CMA, am acting as transcriptionist for Mina Marble, NP.  I have reviewed the above documentation for accuracy and completeness, and I agree with the above. -  Barney Russomanno d. Meagen Limones, NP-C

## 2020-12-17 ENCOUNTER — Other Ambulatory Visit (HOSPITAL_COMMUNITY): Payer: Self-pay

## 2020-12-17 MED FILL — Albuterol Sulfate Inhal Aero 108 MCG/ACT (90MCG Base Equiv): RESPIRATORY_TRACT | 25 days supply | Qty: 18 | Fill #0 | Status: AC

## 2020-12-17 MED FILL — Rosuvastatin Calcium Tab 10 MG: ORAL | 90 days supply | Qty: 90 | Fill #0 | Status: AC

## 2020-12-18 ENCOUNTER — Other Ambulatory Visit (HOSPITAL_COMMUNITY): Payer: Self-pay

## 2020-12-24 ENCOUNTER — Other Ambulatory Visit (HOSPITAL_COMMUNITY): Payer: Self-pay

## 2020-12-24 DIAGNOSIS — M7552 Bursitis of left shoulder: Secondary | ICD-10-CM | POA: Diagnosis not present

## 2020-12-24 DIAGNOSIS — Z79891 Long term (current) use of opiate analgesic: Secondary | ICD-10-CM | POA: Diagnosis not present

## 2020-12-24 DIAGNOSIS — M25569 Pain in unspecified knee: Secondary | ICD-10-CM | POA: Diagnosis not present

## 2020-12-24 DIAGNOSIS — G4700 Insomnia, unspecified: Secondary | ICD-10-CM | POA: Diagnosis not present

## 2020-12-24 DIAGNOSIS — G894 Chronic pain syndrome: Secondary | ICD-10-CM | POA: Diagnosis not present

## 2020-12-24 MED ORDER — DULOXETINE HCL 30 MG PO CPEP
ORAL_CAPSULE | ORAL | 1 refills | Status: DC
  Filled 2020-12-24: qty 60, 30d supply, fill #0
  Filled 2021-01-26: qty 60, 30d supply, fill #1

## 2020-12-24 MED ORDER — TRAMADOL HCL 50 MG PO TABS
ORAL_TABLET | ORAL | 1 refills | Status: DC
  Filled 2020-12-24: qty 120, 30d supply, fill #0
  Filled 2021-01-26: qty 120, 30d supply, fill #1

## 2020-12-30 ENCOUNTER — Other Ambulatory Visit (HOSPITAL_COMMUNITY): Payer: Self-pay

## 2020-12-30 ENCOUNTER — Ambulatory Visit (INDEPENDENT_AMBULATORY_CARE_PROVIDER_SITE_OTHER): Payer: 59 | Admitting: Adult Health

## 2020-12-30 ENCOUNTER — Encounter (INDEPENDENT_AMBULATORY_CARE_PROVIDER_SITE_OTHER): Payer: Self-pay | Admitting: Adult Health

## 2020-12-30 ENCOUNTER — Other Ambulatory Visit: Payer: Self-pay

## 2020-12-30 VITALS — BP 108/61 | HR 86 | Temp 98.3°F | Ht 61.0 in | Wt 155.0 lb

## 2020-12-30 DIAGNOSIS — Z9189 Other specified personal risk factors, not elsewhere classified: Secondary | ICD-10-CM | POA: Diagnosis not present

## 2020-12-30 DIAGNOSIS — M25572 Pain in left ankle and joints of left foot: Secondary | ICD-10-CM | POA: Diagnosis not present

## 2020-12-30 DIAGNOSIS — R7303 Prediabetes: Secondary | ICD-10-CM | POA: Diagnosis not present

## 2020-12-30 DIAGNOSIS — Z6837 Body mass index (BMI) 37.0-37.9, adult: Secondary | ICD-10-CM

## 2020-12-30 MED ORDER — METFORMIN HCL 500 MG PO TABS
ORAL_TABLET | Freq: Every day | ORAL | 0 refills | Status: DC
  Filled 2020-12-30: qty 30, 30d supply, fill #0

## 2021-01-05 DIAGNOSIS — M79672 Pain in left foot: Secondary | ICD-10-CM | POA: Diagnosis not present

## 2021-01-05 NOTE — Progress Notes (Signed)
Chief Complaint:   OBESITY Katherine Tanner is here to discuss her progress with her obesity treatment plan along with follow-up of her obesity related diagnoses. Katherine Tanner is on the Category 2 Plan and states she is following her eating plan approximately 100% of the time. Katherine Tanner states she is biking, weights, and videos 30 minutes 5 times per week.  Today's visit was #: 62 Starting weight: 198 lbs Starting date: 08/30/2018 Today's weight: 155 lbs Today's date: 12/30/2020 Total lbs lost to date: 43 Total lbs lost since last in-office visit: 0  Interim History: Katherine Tanner has been consuming 8-10 oz protein at dinner- denies polyphagia.  She is on Metformin 500 mg QD. Reviewed Body Composition information with pt.  Subjective:   1. Pre-diabetes [place]'s A1c was improved at last check- 5.6 on 11/25/20.   Lab Results  Component Value Date   HGBA1C 5.6 10/12/2020   Lab Results  Component Value Date   INSULIN 6.0 10/12/2020   INSULIN 11.2 03/23/2020   INSULIN 6.2 12/24/2019   INSULIN 4.0 09/11/2019   INSULIN 9.8 05/06/2019   2. Left ankle pain, unspecified chronicity Left knee pain described as constant, hot throbbing pain, rated 6/10. Katherine Tanner is using ice, rest, elevation, PRN Tramadol 50 mg 1 tab BID- often takes 2 tabs at bedtime. She has a follow up with ortho 5 July 22.  3. At risk for diarrhea Katherine Tanner is at higher risk of diarrhea due to Metformin for pre-diabetes.  Assessment/Plan:   1. Pre-diabetes Katherine Tanner will continue to work on weight loss, exercise, and decreasing simple carbohydrates to help decrease the risk of diabetes.   Refill- metFORMIN (GLUCOPHAGE) 500 MG tablet; TAKE 1 TABLET (500 MG TOTAL) BY MOUTH DAILY.  Dispense: 30 tablet; Refill: 0  2. Left ankle pain, unspecified chronicity Follow up with [facility] 5 July 22.  3. At risk for diarrhea Katherine Tanner was given approximately 15 minutes of diarrhea prevention counseling today. She is 60 y.o. female and has risk  factors for diarrhea including medications and changes in diet. We discussed intensive lifestyle modifications today with an emphasis on specific weight loss instructions including dietary strategies.   Repetitive spaced learning was employed today to elicit superior memory formation and behavioral change.  4. Overweight: BMI 29.3  Katherine Tanner is currently in the action stage of change. As such, her goal is to continue with weight loss efforts. She has agreed to the Category 2 Plan- 8-10 oz protein with dinner.   Exercise goals:  As is  Behavioral modification strategies: increasing lean protein intake, decreasing simple carbohydrates, meal planning and cooking strategies, keeping healthy foods in the home, and planning for success.  Katherine Tanner has agreed to follow-up with our clinic in 4 weeks. She was informed of the importance of frequent follow-up visits to maximize her success with intensive lifestyle modifications for her multiple health conditions.   Objective:   Blood pressure 108/61, pulse 86, temperature 98.3 F (36.8 C), height 5\' 1"  (1.549 m), weight 155 lb (70.3 kg), last menstrual period 02/20/2014, SpO2 99 %. Body mass index is 29.29 kg/m.  General: Cooperative, alert, well developed, in no acute distress. HEENT: Conjunctivae and lids unremarkable. Cardiovascular: Regular rhythm.  Lungs: Normal work of breathing. Neurologic: No focal deficits.   Lab Results  Component Value Date   CREATININE 0.65 10/12/2020   BUN 13 10/12/2020   NA 143 10/12/2020   K 4.4 10/12/2020   CL 102 10/12/2020   CO2 25 10/12/2020   Lab Results  Component Value Date   ALT 39 (H) 10/12/2020   AST 24 10/12/2020   ALKPHOS 140 (H) 10/12/2020   BILITOT 0.4 10/12/2020   Lab Results  Component Value Date   HGBA1C 5.6 10/12/2020   HGBA1C 5.8 07/07/2020   HGBA1C 5.7 (H) 03/23/2020   HGBA1C 5.6 12/24/2019   HGBA1C 5.5 09/11/2019   Lab Results  Component Value Date   INSULIN 6.0 10/12/2020    INSULIN 11.2 03/23/2020   INSULIN 6.2 12/24/2019   INSULIN 4.0 09/11/2019   INSULIN 9.8 05/06/2019   Lab Results  Component Value Date   TSH 1.32 07/07/2020   Lab Results  Component Value Date   CHOL 149 07/07/2020   HDL 61.90 07/07/2020   LDLCALC 77 07/07/2020   LDLDIRECT 147.9 12/17/2012   TRIG 53.0 07/07/2020   CHOLHDL 2 07/07/2020   Lab Results  Component Value Date   VD25OH 40.9 10/12/2020   VD25OH 42.01 07/07/2020   VD25OH 77.5 12/24/2019   Lab Results  Component Value Date   WBC 5.6 11/25/2020   HGB 13.3 11/25/2020   HCT 40.7 11/25/2020   MCV 87.2 11/25/2020   PLT 154 11/25/2020   Lab Results  Component Value Date   IRON 63 07/07/2020    Attestation Statements:   Reviewed by clinician on day of visit: allergies, medications, problem list, medical history, surgical history, family history, social history, and previous encounter notes.  Coral Ceo, CMA, am acting as transcriptionist for Mina Marble, NP.  I have reviewed the above documentation for accuracy and completeness, and I agree with the above. -  Melina Mosteller d. Shandricka Monroy, NP-C

## 2021-01-07 DIAGNOSIS — M25572 Pain in left ankle and joints of left foot: Secondary | ICD-10-CM | POA: Diagnosis not present

## 2021-01-12 ENCOUNTER — Other Ambulatory Visit (HOSPITAL_COMMUNITY): Payer: Self-pay

## 2021-01-12 ENCOUNTER — Encounter (INDEPENDENT_AMBULATORY_CARE_PROVIDER_SITE_OTHER): Payer: Self-pay | Admitting: Adult Health

## 2021-01-12 DIAGNOSIS — M25572 Pain in left ankle and joints of left foot: Secondary | ICD-10-CM | POA: Insufficient documentation

## 2021-01-12 DIAGNOSIS — M79672 Pain in left foot: Secondary | ICD-10-CM | POA: Diagnosis not present

## 2021-01-12 MED ORDER — PREDNISONE 10 MG (21) PO TBPK
ORAL_TABLET | ORAL | 0 refills | Status: DC
  Filled 2021-01-12: qty 21, 6d supply, fill #0

## 2021-01-13 NOTE — Telephone Encounter (Signed)
Please advise 

## 2021-01-20 ENCOUNTER — Other Ambulatory Visit (HOSPITAL_COMMUNITY): Payer: Self-pay

## 2021-01-20 DIAGNOSIS — M76822 Posterior tibial tendinitis, left leg: Secondary | ICD-10-CM | POA: Diagnosis not present

## 2021-01-20 DIAGNOSIS — M25571 Pain in right ankle and joints of right foot: Secondary | ICD-10-CM | POA: Diagnosis not present

## 2021-01-20 MED FILL — Losartan Potassium & Hydrochlorothiazide Tab 100-25 MG: ORAL | 90 days supply | Qty: 90 | Fill #0 | Status: AC

## 2021-01-21 ENCOUNTER — Other Ambulatory Visit (HOSPITAL_COMMUNITY): Payer: Self-pay

## 2021-01-21 MED ORDER — PREDNISONE 10 MG (21) PO TBPK
ORAL_TABLET | ORAL | 0 refills | Status: DC
  Filled 2021-01-21: qty 21, 6d supply, fill #0

## 2021-01-26 ENCOUNTER — Other Ambulatory Visit (HOSPITAL_COMMUNITY): Payer: Self-pay

## 2021-01-26 MED FILL — Montelukast Sodium Tab 10 MG (Base Equiv): ORAL | 90 days supply | Qty: 90 | Fill #1 | Status: AC

## 2021-01-27 ENCOUNTER — Other Ambulatory Visit (HOSPITAL_COMMUNITY): Payer: Self-pay

## 2021-01-27 ENCOUNTER — Encounter (INDEPENDENT_AMBULATORY_CARE_PROVIDER_SITE_OTHER): Payer: Self-pay | Admitting: Adult Health

## 2021-01-27 ENCOUNTER — Ambulatory Visit (INDEPENDENT_AMBULATORY_CARE_PROVIDER_SITE_OTHER): Payer: 59 | Admitting: Adult Health

## 2021-01-27 ENCOUNTER — Other Ambulatory Visit: Payer: Self-pay

## 2021-01-27 VITALS — BP 116/66 | HR 80 | Temp 98.4°F | Ht 61.0 in | Wt 154.0 lb

## 2021-01-27 DIAGNOSIS — M25572 Pain in left ankle and joints of left foot: Secondary | ICD-10-CM | POA: Diagnosis not present

## 2021-01-27 DIAGNOSIS — E669 Obesity, unspecified: Secondary | ICD-10-CM

## 2021-01-27 DIAGNOSIS — Z9189 Other specified personal risk factors, not elsewhere classified: Secondary | ICD-10-CM

## 2021-01-27 DIAGNOSIS — Z6834 Body mass index (BMI) 34.0-34.9, adult: Secondary | ICD-10-CM

## 2021-01-27 DIAGNOSIS — R7303 Prediabetes: Secondary | ICD-10-CM

## 2021-01-27 MED ORDER — METFORMIN HCL 500 MG PO TABS
ORAL_TABLET | Freq: Every day | ORAL | 0 refills | Status: DC
  Filled 2021-01-27: qty 30, 30d supply, fill #0

## 2021-02-01 DIAGNOSIS — M79672 Pain in left foot: Secondary | ICD-10-CM | POA: Diagnosis not present

## 2021-02-01 NOTE — Progress Notes (Signed)
Chief Complaint:   OBESITY Katherine Tanner is here to discuss her progress with her obesity treatment plan along with follow-up of her obesity related diagnoses. Katherine Tanner is on the Category 2 Plan and states she is following her eating plan approximately 100% of the time. Katherine Tanner states she is biking and doing videos for 30 minutes 5-6 times per week.  Today's visit was #: 66 Starting weight: 198 lbs Starting date: 08/30/2018 Today's weight: 154 lbs Today's date: 01/27/2021 Total lbs lost to date: 44 lbs Total lbs lost since last in-office visit: 1 lb  Interim History: Katherine Tanner has been having some left ankle pain and recently completed prednisone Dosepak.  She declined an additional round of steroids.  Her left ankle is now stable.   She is down another pound since her last office visit.   She is in the maintenance phase.  Subjective:   1. Left ankle pain, unspecified chronicity Katherine Tanner has been having some left ankle pain and recently completed prednisone Dosepak.  She declined an additional round of steroids.  Her left ankle is now stable.   She is down another pound since her last office visit.    2. Prediabetes Katherine Tanner has a diagnosis of prediabetes based on her elevated HgA1c and was informed this puts her at greater risk of developing diabetes. She continues to work on diet and exercise to decrease her risk of diabetes. She denies nausea or hypoglycemia.  She is on metformin 500 mg daily.  Denies medication side effects.  Lab Results  Component Value Date   HGBA1C 5.6 10/12/2020   Lab Results  Component Value Date   INSULIN 6.0 10/12/2020   INSULIN 11.2 03/23/2020   INSULIN 6.2 12/24/2019   INSULIN 4.0 09/11/2019   INSULIN 9.8 05/06/2019   3. At risk for activity intolerance Katherine Tanner is at risk for activity intolerance due to chronic pain.  Assessment/Plan:   1. Left ankle pain, unspecified chronicity Wollow-up with PCP and orthopedic specialists.  2. Prediabetes Refill  metformin 500 mg daily today, as per below.  - Refill metFORMIN (GLUCOPHAGE) 500 MG tablet; TAKE 1 TABLET (500 MG TOTAL) BY MOUTH DAILY.  Dispense: 30 tablet; Refill: 0  3. At risk for activity intolerance Katherine Tanner was given approximately 15 minutes of exercise intolerance counseling today. She is 60 y.o. female and has risk factors exercise intolerance including obesity. We discussed intensive lifestyle modifications today with an emphasis on specific weight loss instructions and strategies. Katherine Tanner will slowly increase activity as tolerated.  Repetitive spaced learning was employed today to elicit superior memory formation and behavioral change.   4. Obesity with current BMI 29.1  Katherine Tanner is currently in the action stage of change. As such, her goal is to continue with weight loss efforts. She has agreed to the Category 2 Plan.   Exercise goals:  As is.  Behavioral modification strategies: increasing lean protein intake, decreasing simple carbohydrates, meal planning and cooking strategies, keeping healthy foods in the home, and planning for success.  Katherine Tanner has agreed to follow-up with our clinic in 4 weeks. She was informed of the importance of frequent follow-up visits to maximize her success with intensive lifestyle modifications for her multiple health conditions.   Objective:   Blood pressure 116/66, pulse 80, temperature 98.4 F (36.9 C), height '5\' 1"'$  (1.549 m), weight 154 lb (69.9 kg), last menstrual period 02/20/2014, SpO2 97 %.  General: Cooperative, alert, well developed, in no acute distress. HEENT: Conjunctivae and lids unremarkable. Cardiovascular: Regular rhythm.  Lungs: Normal work of breathing. Neurologic: No focal deficits.   Lab Results  Component Value Date   CREATININE 0.65 10/12/2020   BUN 13 10/12/2020   NA 143 10/12/2020   K 4.4 10/12/2020   CL 102 10/12/2020   CO2 25 10/12/2020   Lab Results  Component Value Date   ALT 39 (H) 10/12/2020   AST 24  10/12/2020   ALKPHOS 140 (H) 10/12/2020   BILITOT 0.4 10/12/2020   Lab Results  Component Value Date   HGBA1C 5.6 10/12/2020   HGBA1C 5.8 07/07/2020   HGBA1C 5.7 (H) 03/23/2020   HGBA1C 5.6 12/24/2019   HGBA1C 5.5 09/11/2019   Lab Results  Component Value Date   INSULIN 6.0 10/12/2020   INSULIN 11.2 03/23/2020   INSULIN 6.2 12/24/2019   INSULIN 4.0 09/11/2019   INSULIN 9.8 05/06/2019   Lab Results  Component Value Date   TSH 1.32 07/07/2020   Lab Results  Component Value Date   CHOL 149 07/07/2020   HDL 61.90 07/07/2020   LDLCALC 77 07/07/2020   LDLDIRECT 147.9 12/17/2012   TRIG 53.0 07/07/2020   CHOLHDL 2 07/07/2020   Lab Results  Component Value Date   VD25OH 40.9 10/12/2020   VD25OH 42.01 07/07/2020   VD25OH 77.5 12/24/2019   Lab Results  Component Value Date   WBC 5.6 11/25/2020   HGB 13.3 11/25/2020   HCT 40.7 11/25/2020   MCV 87.2 11/25/2020   PLT 154 11/25/2020   Lab Results  Component Value Date   IRON 63 07/07/2020   Attestation Statements:   Reviewed by clinician on day of visit: allergies, medications, problem list, medical history, surgical history, family history, social history, and previous encounter notes.  I, Water quality scientist, CMA, am acting as Location manager for Mina Marble, NP.  I have reviewed the above documentation for accuracy and completeness, and I agree with the above. -  Kamarius Buckbee d. Gabrielle Mester, NP-C

## 2021-02-18 ENCOUNTER — Other Ambulatory Visit (HOSPITAL_COMMUNITY): Payer: Self-pay

## 2021-02-18 DIAGNOSIS — M7552 Bursitis of left shoulder: Secondary | ICD-10-CM | POA: Diagnosis not present

## 2021-02-18 DIAGNOSIS — G894 Chronic pain syndrome: Secondary | ICD-10-CM | POA: Diagnosis not present

## 2021-02-18 DIAGNOSIS — G4700 Insomnia, unspecified: Secondary | ICD-10-CM | POA: Diagnosis not present

## 2021-02-18 DIAGNOSIS — M25569 Pain in unspecified knee: Secondary | ICD-10-CM | POA: Diagnosis not present

## 2021-02-18 MED ORDER — TRAMADOL HCL 50 MG PO TABS
ORAL_TABLET | ORAL | 1 refills | Status: DC
  Filled 2021-03-01: qty 180, 30d supply, fill #0
  Filled 2021-04-16: qty 180, 30d supply, fill #1

## 2021-02-18 MED ORDER — DULOXETINE HCL 30 MG PO CPEP
ORAL_CAPSULE | ORAL | 1 refills | Status: DC
  Filled 2021-02-18 – 2021-03-01 (×2): qty 60, 30d supply, fill #0
  Filled 2021-04-01: qty 60, 30d supply, fill #1

## 2021-02-18 MED ORDER — BACLOFEN 10 MG PO TABS
ORAL_TABLET | ORAL | 3 refills | Status: DC
  Filled 2021-02-18 – 2021-03-01 (×2): qty 60, 30d supply, fill #0
  Filled 2021-04-01: qty 60, 30d supply, fill #1
  Filled 2021-05-10: qty 60, 30d supply, fill #2
  Filled 2021-06-13: qty 60, 30d supply, fill #3

## 2021-02-22 ENCOUNTER — Other Ambulatory Visit: Payer: Self-pay

## 2021-02-22 ENCOUNTER — Other Ambulatory Visit (HOSPITAL_COMMUNITY): Payer: Self-pay

## 2021-02-22 ENCOUNTER — Encounter (INDEPENDENT_AMBULATORY_CARE_PROVIDER_SITE_OTHER): Payer: Self-pay | Admitting: Adult Health

## 2021-02-22 ENCOUNTER — Ambulatory Visit (INDEPENDENT_AMBULATORY_CARE_PROVIDER_SITE_OTHER): Payer: 59 | Admitting: Adult Health

## 2021-02-22 VITALS — BP 128/80 | HR 75 | Temp 98.4°F | Ht 61.0 in | Wt 154.0 lb

## 2021-02-22 DIAGNOSIS — R7303 Prediabetes: Secondary | ICD-10-CM

## 2021-02-22 DIAGNOSIS — G8929 Other chronic pain: Secondary | ICD-10-CM

## 2021-02-22 DIAGNOSIS — Z6834 Body mass index (BMI) 34.0-34.9, adult: Secondary | ICD-10-CM

## 2021-02-22 DIAGNOSIS — E669 Obesity, unspecified: Secondary | ICD-10-CM | POA: Diagnosis not present

## 2021-02-22 DIAGNOSIS — M25562 Pain in left knee: Secondary | ICD-10-CM

## 2021-02-22 DIAGNOSIS — M25561 Pain in right knee: Secondary | ICD-10-CM | POA: Diagnosis not present

## 2021-02-22 DIAGNOSIS — Z9189 Other specified personal risk factors, not elsewhere classified: Secondary | ICD-10-CM

## 2021-02-22 MED ORDER — METFORMIN HCL 500 MG PO TABS
ORAL_TABLET | Freq: Every day | ORAL | 0 refills | Status: DC
  Filled 2021-02-22: qty 30, 30d supply, fill #0

## 2021-02-23 ENCOUNTER — Other Ambulatory Visit (HOSPITAL_COMMUNITY): Payer: Self-pay

## 2021-02-23 NOTE — Progress Notes (Signed)
Chief Complaint:   OBESITY Katherine Tanner is here to discuss her progress with her obesity treatment plan along with follow-up of her obesity related diagnoses. Katherine Tanner is on the Category 2 Plan and states she is following her eating plan approximately 100% of the time. Katherine Tanner states she is Weights/chair 30 minutes 5 times per week.  Today's visit was #: 54 Starting weight: 198 lbs Starting date: 08/30/2018 Today's weight: 154 lbs Today's date: 02/22/2021 Total lbs lost to date: 44 lbs Total lbs lost since last in-office visit: 0  Interim History: Katherine Tanner has had an increase pain of her left medial ankle- since 07/2020.   She reports pain, 6/10 burning sensation at rest, up to 8/10 with use and described as burning/stabbing pain.   Reviewed body composition with patient.   She is maintaining her weight loss success in the maintenance phase!  Subjective:   1. Prediabetes On 10/12/2020, CMP - GFR 102, A1c 5.6 with normal BG of 95 and insulin level of 6.0.   Discussed risks/benefits of GLP-1 therapy.   She declines change from metformin to GLP-1.  Lab Results  Component Value Date   HGBA1C 5.6 10/12/2020   Lab Results  Component Value Date   INSULIN 6.0 10/12/2020   INSULIN 11.2 03/23/2020   INSULIN 6.2 12/24/2019   INSULIN 4.0 09/11/2019   INSULIN 9.8 05/06/2019   2. Chronic pain of both knees Takes tramadol 1 tablet in early AM - 0300, 1 tablet at lunch, 1 tablet when she returns home, and 1 tablet at bedtime.  Pain Management increased to 1-2 tablets every 8 hours as needed for pain - followed by [facility].  3. At risk for activity intolerance Katherine Tanner is at risk for activity intolerance due to bilateral knee pain and left ankle pain.  Assessment/Plan:   1. Prediabetes Shakema will continue to work on weight loss, exercise, and decreasing simple carbohydrates to help decrease the risk of diabetes.   - Refill metFORMIN (GLUCOPHAGE) 500 MG tablet; TAKE 1 TABLET  (500 MG TOTAL) BY MOUTH DAILY.   Dispense: 30 tablet; Refill: 0  2. Chronic pain of both knees Continue with [facility] as directed.   3. At risk for activity intolerance Katherine Tanner was given approximately 15 minutes of exercise intolerance counseling today. She is 60 y.o. female and has risk factors exercise intolerance including obesity. We discussed intensive lifestyle modifications today with an emphasis on specific weight loss instructions and strategies. Katherine Tanner will slowly increase activity as tolerated.  Repetitive spaced learning was employed today to elicit superior memory formation and behavioral change.   4. Obesity with current BMI 29.2  Katherine Tanner is currently in the action stage of change. As such, her goal is to continue with weight loss efforts. She has agreed to the Category 2 Plan.   Check fasting labs at next visit.  Exercise goals:  As is.  Behavioral modification strategies: increasing lean protein intake, decreasing simple carbohydrates, keeping healthy foods in the home, ways to avoid boredom eating, better snacking choices, and planning for success.  Katherine Tanner has agreed to follow-up with our clinic in 4 weeks, fasting. She was informed of the importance of frequent follow-up visits to maximize her success with intensive lifestyle modifications for her multiple health conditions.   Objective:   Blood pressure 128/80, pulse 75, temperature 98.4 F (36.9 C), height '5\' 1"'$  (1.549 m), weight 154 lb (69.9 kg), last menstrual period 02/20/2014, SpO2 98 %. Body mass index is 29.1 kg/m.  General: Cooperative, alert, well developed, in no acute distress. HEENT: Conjunctivae and lids unremarkable. Cardiovascular: Regular rhythm.  Lungs: Normal work of breathing. Neurologic: No focal deficits.   Lab Results  Component Value Date   CREATININE 0.65 10/12/2020   BUN 13 10/12/2020   NA 143 10/12/2020   K 4.4 10/12/2020   CL 102 10/12/2020   CO2 25 10/12/2020    Lab Results  Component Value Date   ALT 39 (H) 10/12/2020   AST 24 10/12/2020   ALKPHOS 140 (H) 10/12/2020   BILITOT 0.4 10/12/2020   Lab Results  Component Value Date   HGBA1C 5.6 10/12/2020   HGBA1C 5.8 07/07/2020   HGBA1C 5.7 (H) 03/23/2020   HGBA1C 5.6 12/24/2019   HGBA1C 5.5 09/11/2019   Lab Results  Component Value Date   INSULIN 6.0 10/12/2020   INSULIN 11.2 03/23/2020   INSULIN 6.2 12/24/2019   INSULIN 4.0 09/11/2019   INSULIN 9.8 05/06/2019   Lab Results  Component Value Date   TSH 1.32 07/07/2020   Lab Results  Component Value Date   CHOL 149 07/07/2020   HDL 61.90 07/07/2020   LDLCALC 77 07/07/2020   LDLDIRECT 147.9 12/17/2012   TRIG 53.0 07/07/2020   CHOLHDL 2 07/07/2020   Lab Results  Component Value Date   VD25OH 40.9 10/12/2020   VD25OH 42.01 07/07/2020   VD25OH 77.5 12/24/2019   Lab Results  Component Value Date   WBC 5.6 11/25/2020   HGB 13.3 11/25/2020   HCT 40.7 11/25/2020   MCV 87.2 11/25/2020   PLT 154 11/25/2020   Lab Results  Component Value Date   IRON 63 07/07/2020   Attestation Statements:   Reviewed by clinician on day of visit: allergies, medications, problem list, medical history, surgical history, family history, social history, and previous encounter notes.  I, Water quality scientist, CMA, am acting as Location manager for Mina Marble, NP.  I have reviewed the above documentation for accuracy and completeness, and I agree with the above. -  Amritpal Shropshire d. Loryn Haacke, NP-C

## 2021-02-25 ENCOUNTER — Other Ambulatory Visit (HOSPITAL_COMMUNITY): Payer: Self-pay

## 2021-03-01 ENCOUNTER — Other Ambulatory Visit (HOSPITAL_COMMUNITY): Payer: Self-pay

## 2021-03-02 DIAGNOSIS — M79672 Pain in left foot: Secondary | ICD-10-CM | POA: Diagnosis not present

## 2021-03-09 ENCOUNTER — Encounter: Payer: Self-pay | Admitting: Family Medicine

## 2021-03-09 ENCOUNTER — Encounter (INDEPENDENT_AMBULATORY_CARE_PROVIDER_SITE_OTHER): Payer: Self-pay | Admitting: Adult Health

## 2021-03-09 ENCOUNTER — Other Ambulatory Visit: Payer: Self-pay | Admitting: Obstetrics and Gynecology

## 2021-03-09 DIAGNOSIS — Z1231 Encounter for screening mammogram for malignant neoplasm of breast: Secondary | ICD-10-CM

## 2021-03-09 IMAGING — MR MR LUMBAR SPINE W/O CM
4 of 5 series · 26 of 48 positions shown · non-contrast
Comparison: Comparison made with previous radiograph from
06/09/2020.

CLINICAL DATA: Initial evaluation for lumbar radiculopathy, pain,
weakness and numbness in bilateral knees and left ankle since 6832.

EXAM:
MRI LUMBAR SPINE WITHOUT CONTRAST
TECHNIQUE: Multiplanar, multisequence MR imaging of the lumbar spine was
performed. No intravenous contrast was administered.

[Series 2: T2 · sagittal · 4.0mm · 1.09mm/px · 6 of 16 slices shown (1 of 2)]
[im 1/16]
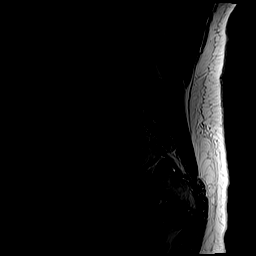
[im 4/16]
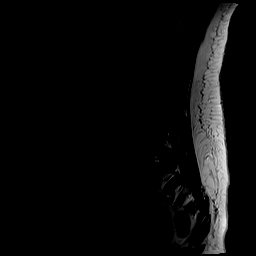
[im 7/16]
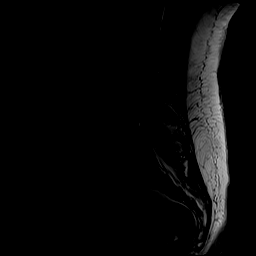
[im 10/16]
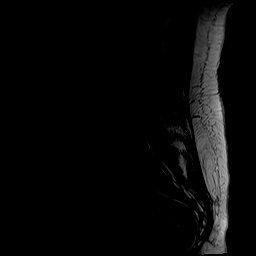
[im 13/16]
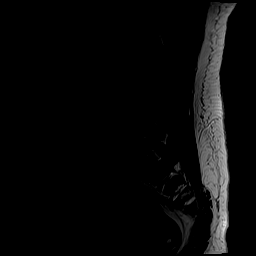
[im 16/16]
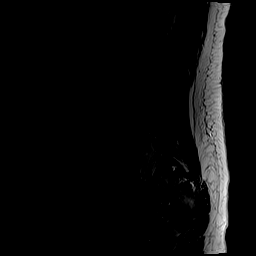

[Series 4: T1 · sagittal · 4.0mm · 1.09mm/px · 6 of 16 slices shown (1 of 2)]
[im 1/16]
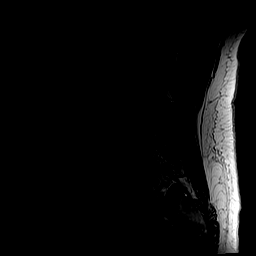
[im 4/16]
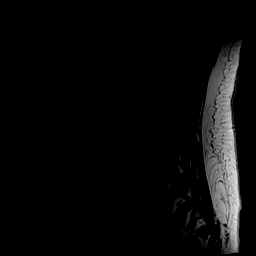
[im 7/16]
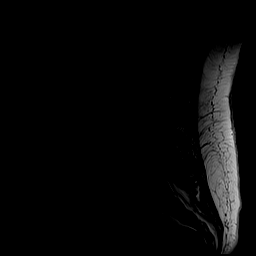
[im 10/16]
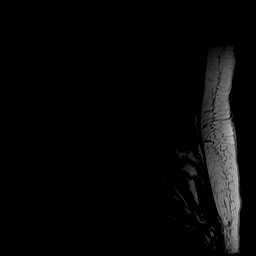
[im 13/16]
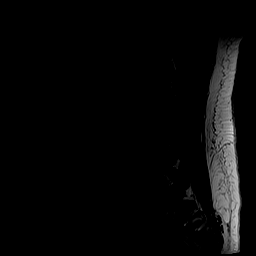
[im 16/16]
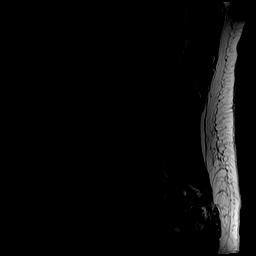

[Series 5: T2 · axial · 4.0mm · 0.39mm/px · z∈[-72,+130]mm · 9 of 42 slices shown (2 of 2)]
[im 1/42]
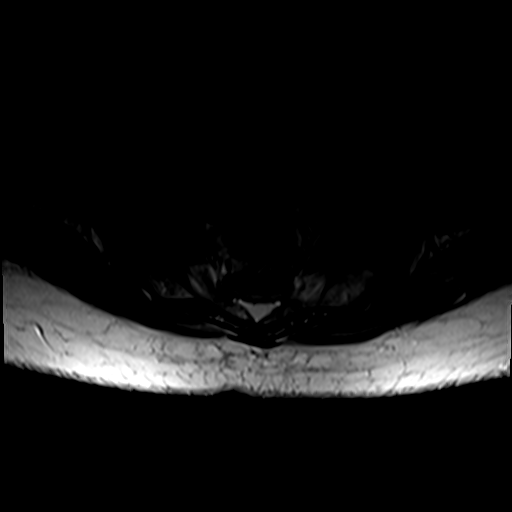
[im 6/42]
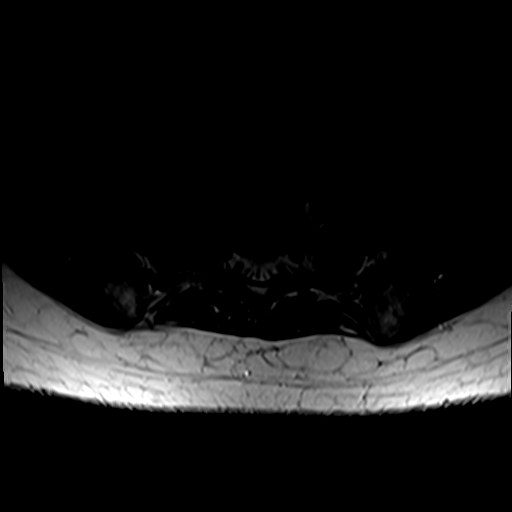
[im 12/42]
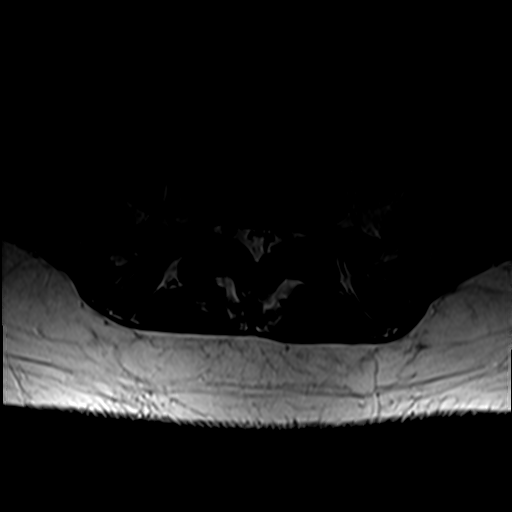
[im 18/42]
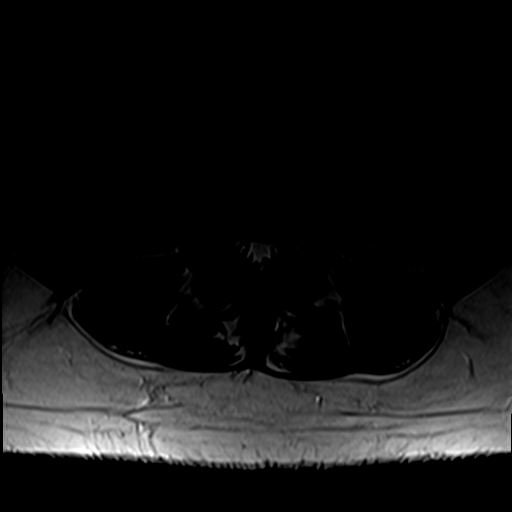
[im 21/42]
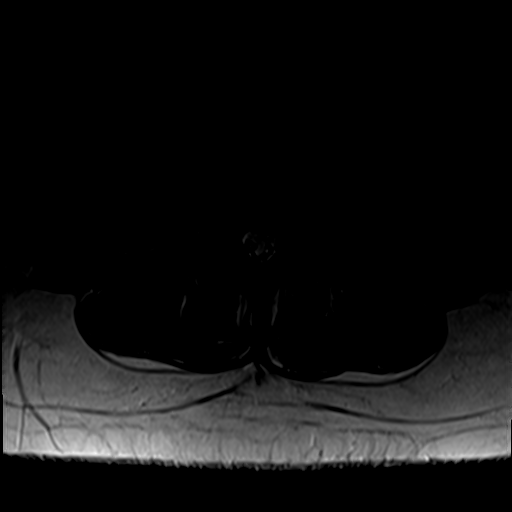
[im 24/42]
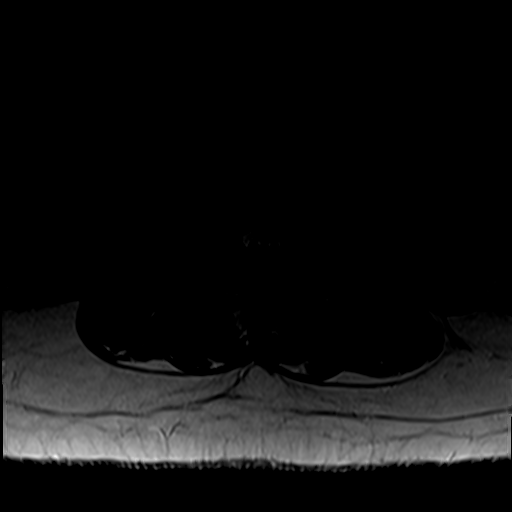
[im 30/42]
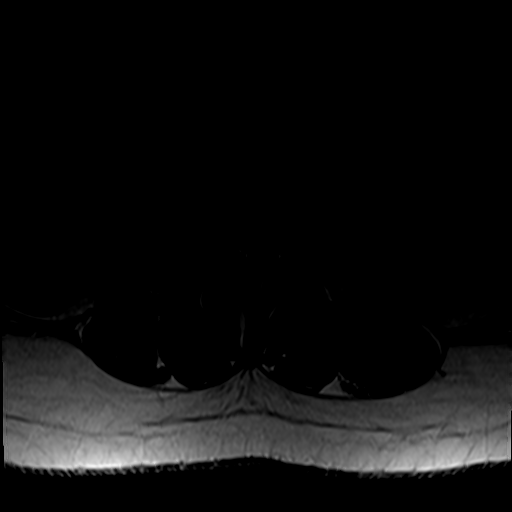
[im 36/42]
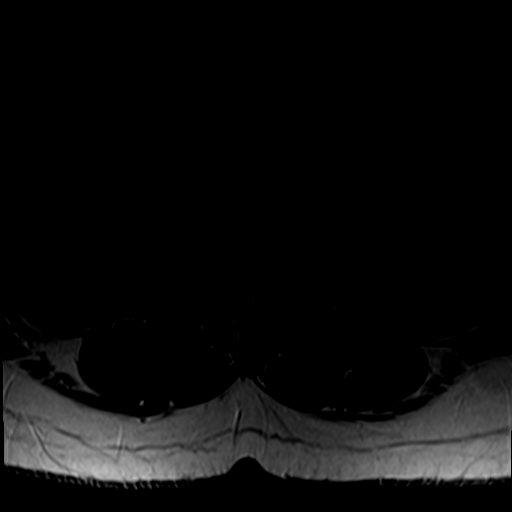
[im 42/42]
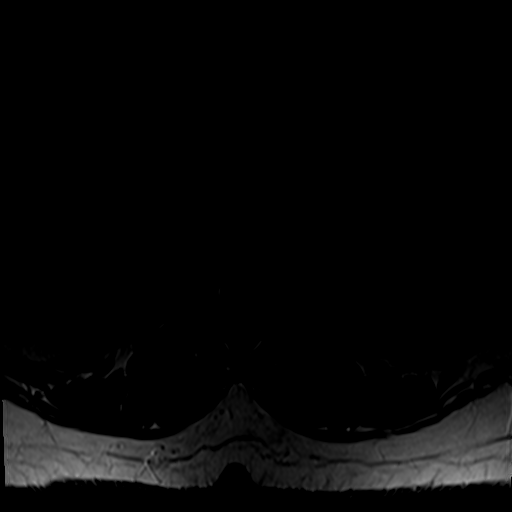

[Series 6: T1 · axial · 4.0mm · 0.39mm/px · z∈[-72,+101]mm · 5 of 42 slices shown (2 of 2)]
[im 1/42]
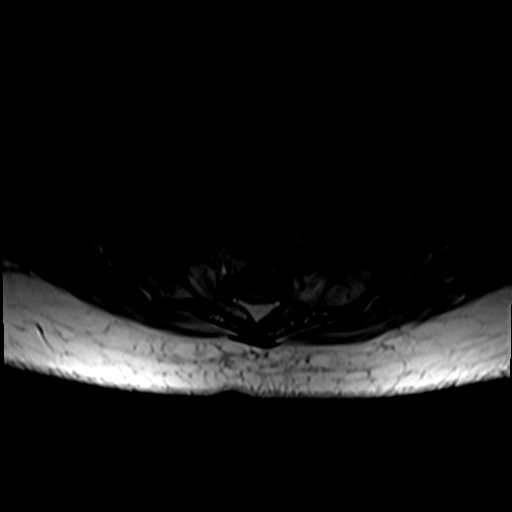
[im 6/42]
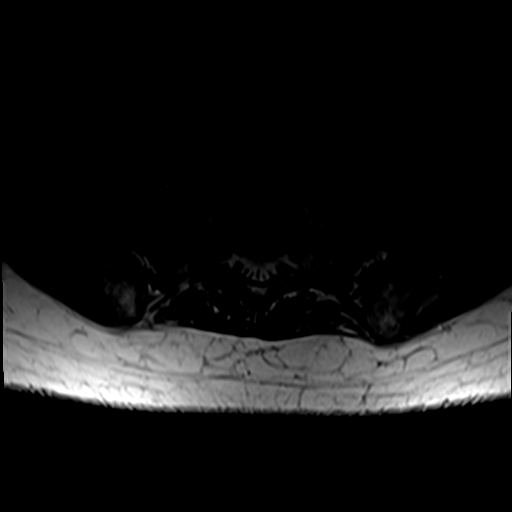
[im 12/42]
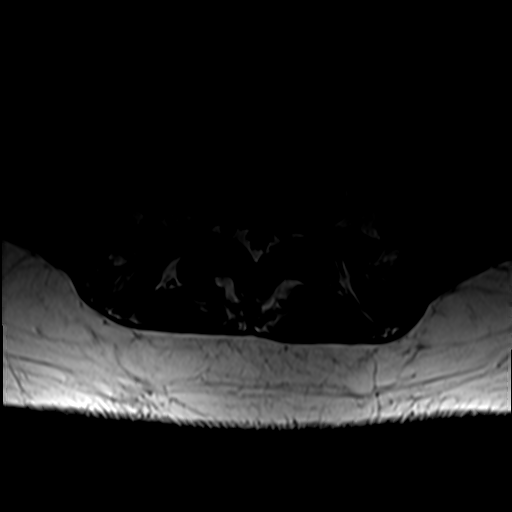
[im 21/42]
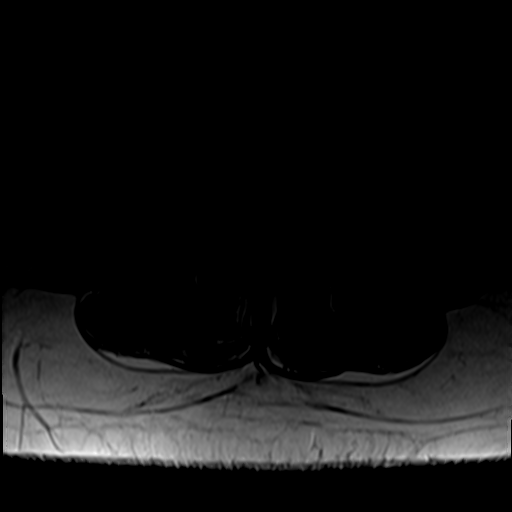
[im 36/42]
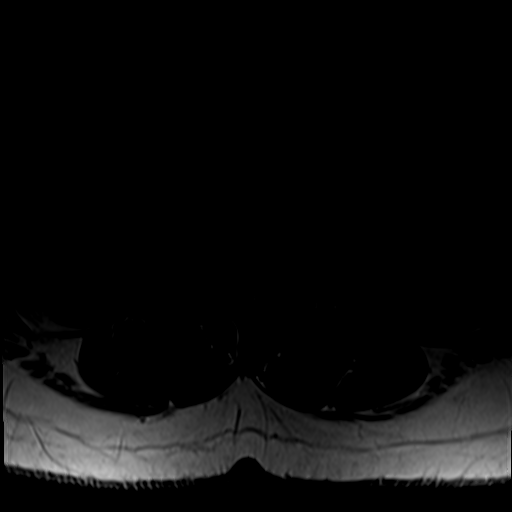

[26 of 48 positions shown; findings below may reference images not displayed]

FINDINGS: Segmentation: Standard. Lowest well-formed disc space labeled the
L5-S1 level.

Alignment: Mild levoscoliosis. Trace anterolisthesis of L4 on L5,
chronic and facet mediated.

Vertebrae: Vertebral body height well maintained without acute or
chronic fracture. Bone marrow signal intensity within normal limits.
No worrisome osseous lesions. Mild discogenic reactive endplate
change present about the L5-S1 interspace. No other abnormal marrow
edema.

Conus medullaris and cauda equina: Conus extends to the L1-2 level.
Conus and cauda equina appear normal.

Paraspinal and other soft tissues: Sinus paraspinous soft tissues
within normal limits. Subcentimeter simple cyst partially visualized
at the interpolar right kidney. There is an apparent soft tissue
mass within the left adnexa, partially visualized. This measures
approximately 6.0 x 4.3 cm (series 5, image 42). An adjacent 2.8 cm
simple cyst noted (series 5, image 37). Findings indeterminate, but
could reflect an ovarian based neoplasm.

Disc levels:

T12-L1: Normal interspace. Mild left-sided facet hypertrophy. No
stenosis.

L1-2:  Normal interspace.  Minimal facet hypertrophy.  No stenosis.

L2-3: Degenerative intervertebral disc space narrowing with mild
disc bulge and disc desiccation. Minimal facet hypertrophy. No canal
or foraminal stenosis.

L3-4: Minimal disc bulge. Superimposed small biforaminal disc
protrusions, slightly larger on the left (series 5, image 19). Mild
facet and ligament flavum hypertrophy. No significant spinal
stenosis. Foramina remain patent.

L4-5: Trace anterolisthesis. No significant disc bulge. Mild to
moderate facet and ligament flavum hypertrophy. Borderline mild
spinal stenosis. Foramina remain patent.

L5-S1: Mild diffuse disc bulge with disc desiccation. Associated
reactive endplate change with marginal endplate osteophytic
spurring. Mild bilateral facet hypertrophy. Epidural lipomatosis. No
significant spinal stenosis. Foramina remain patent.
IMPRESSION: 1. 6.0 x 4.3 cm soft tissue mass within the left adnexa, partially
visualized. Finding is indeterminate, but could potentially reflect
an ovarian based neoplasm. Further evaluation with dedicated pelvic
ultrasound recommended.
2. Mild noncompressive disc bulging with facet hypertrophy at L5-S1
without significant stenosis or neural impingement.
3. Small biforaminal disc protrusions at L3-4 without stenosis or
impingement.

## 2021-03-09 NOTE — Telephone Encounter (Signed)
Last OV with Katy 

## 2021-03-15 ENCOUNTER — Other Ambulatory Visit (HOSPITAL_COMMUNITY): Payer: Self-pay

## 2021-03-16 DIAGNOSIS — M76822 Posterior tibial tendinitis, left leg: Secondary | ICD-10-CM | POA: Diagnosis not present

## 2021-03-22 ENCOUNTER — Other Ambulatory Visit: Payer: Self-pay

## 2021-03-22 ENCOUNTER — Encounter (INDEPENDENT_AMBULATORY_CARE_PROVIDER_SITE_OTHER): Payer: Self-pay | Admitting: Adult Health

## 2021-03-22 ENCOUNTER — Ambulatory Visit (INDEPENDENT_AMBULATORY_CARE_PROVIDER_SITE_OTHER): Payer: 59 | Admitting: Adult Health

## 2021-03-22 ENCOUNTER — Other Ambulatory Visit (HOSPITAL_COMMUNITY): Payer: Self-pay

## 2021-03-22 VITALS — BP 130/74 | HR 74 | Temp 98.2°F | Ht 61.0 in | Wt 154.0 lb

## 2021-03-22 DIAGNOSIS — Z6830 Body mass index (BMI) 30.0-30.9, adult: Secondary | ICD-10-CM

## 2021-03-22 DIAGNOSIS — M25572 Pain in left ankle and joints of left foot: Secondary | ICD-10-CM

## 2021-03-22 DIAGNOSIS — E66811 Obesity, class 1: Secondary | ICD-10-CM

## 2021-03-22 DIAGNOSIS — E559 Vitamin D deficiency, unspecified: Secondary | ICD-10-CM | POA: Diagnosis not present

## 2021-03-22 DIAGNOSIS — I10 Essential (primary) hypertension: Secondary | ICD-10-CM

## 2021-03-22 DIAGNOSIS — E669 Obesity, unspecified: Secondary | ICD-10-CM | POA: Diagnosis not present

## 2021-03-22 DIAGNOSIS — R7401 Elevation of levels of liver transaminase levels: Secondary | ICD-10-CM | POA: Diagnosis not present

## 2021-03-22 DIAGNOSIS — E782 Mixed hyperlipidemia: Secondary | ICD-10-CM | POA: Diagnosis not present

## 2021-03-22 DIAGNOSIS — R7303 Prediabetes: Secondary | ICD-10-CM

## 2021-03-22 DIAGNOSIS — Z9189 Other specified personal risk factors, not elsewhere classified: Secondary | ICD-10-CM

## 2021-03-22 MED ORDER — METFORMIN HCL 500 MG PO TABS
ORAL_TABLET | Freq: Every day | ORAL | 0 refills | Status: DC
  Filled 2021-03-22 – 2021-03-24 (×2): qty 30, 30d supply, fill #0

## 2021-03-22 NOTE — Progress Notes (Signed)
Chief Complaint:   OBESITY Katherine Tanner is here to discuss her progress with her obesity treatment plan along with follow-up of her obesity related diagnoses. Katherine Tanner is on the Category 2 Plan and states she is following her eating plan approximately 100% of the time. Katherine Tanner states she is doing chair exercises for 30 minutes 5 times per week.  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   She has been using PAL more frequently and reports a better work/life balance.    Of note:  She has received 2 recent steroid injections in her left ankle over the last 4 weeks.  Subjective:   1. Prediabetes Katherine Tanner has a diagnosis of prediabetes based on her elevated HgA1c and was informed this puts her at greater risk of developing diabetes. She continues to work on diet and exercise to decrease her risk of diabetes. She denies nausea or hypoglycemia.  Lab Results  Component Value Date   HGBA1C 5.6 10/12/2020   Lab Results  Component Value Date   INSULIN 6.0 10/12/2020   INSULIN 11.2 03/23/2020   INSULIN 6.2 12/24/2019   INSULIN 4.0 09/11/2019   INSULIN 9.8 05/06/2019   2. Transaminitis She denies RUQ pain.  3. Vitamin D deficiency She is taking OTC vitamin D3 1,000 IU daily.  Lab Results  Component Value Date   VD25OH 40.9 10/12/2020   VD25OH 42.01 07/07/2020   VD25OH 77.5 12/24/2019   4. Essential hypertension BP/HR stable. She is on Hyzaar 100/25 mg daily.  BP Readings from Last 3 Encounters:  03/22/21 130/74  02/22/21 128/80  01/27/21 116/66   5. Left ankle pain, unspecified chronicity Received steroid injection into left ankle - 2 recent injections over the last month.  6. Mixed hyperlipidemia PCP  manages HLD - she is on Crestor 10 mg daily. She denies myalgias.  Lab Results  Component Value Date   ALT 39 (H) 10/12/2020   AST 24 10/12/2020   ALKPHOS 140 (H) 10/12/2020   BILITOT 0.4 10/12/2020   Lab Results  Component Value Date   CHOL 149 07/07/2020   HDL 61.90 07/07/2020   LDLCALC 77 07/07/2020   LDLDIRECT 147.9 12/17/2012   TRIG 53.0 07/07/2020   CHOLHDL 2 07/07/2020   7. At risk for heart disease Katherine Tanner is at a higher than average risk for cardiovascular disease due to hyperlipidemia, hypertension, and prediabetes.  Assessment/Plan:   1. Prediabetes Refill metformin 500 mg daily, as per below. Check labs today.  - Hemoglobin A1c - Insulin, random - Vitamin B12 - Refill metFORMIN (GLUCOPHAGE) 500 MG tablet; TAKE 1 TABLET (500 MG TOTAL) BY MOUTH DAILY.  Dispense: 30 tablet; Refill: 0  2. Transaminitis Check labs today.  - Comprehensive metabolic panel  3. Vitamin D deficiency Will check vitamin D level today.  - VITAMIN D 25 Hydroxy (Vit-D Deficiency, Fractures)  4. Essential hypertension Check labs today.  - Comprehensive metabolic panel  5. Left ankle pain, unspecified chronicity Continue with Orthopedics as directed.  6. Mixed hyperlipidemia Will check labs today.  - Lipid panel  7. At risk for heart disease Katherine Tanner was given approximately 15 minutes of coronary artery disease prevention counseling today. She is 60 y.o. female and has risk factors for  heart disease including obesity. We discussed intensive lifestyle modifications today with an emphasis on specific weight loss instructions and strategies.   Repetitive spaced learning was employed today to elicit superior memory formation and behavioral change.  8. Obesity with current BMI 29.1  Katherine Tanner is currently in the action stage of change. As such, her goal is to continue with weight loss efforts. She has agreed to the Category 2 Plan.   Exercise goals:  As is.  Behavioral modification  strategies: increasing lean protein intake, decreasing simple carbohydrates, meal planning and cooking strategies, keeping healthy foods in the home, and planning for success.  Katherine Tanner has agreed to follow-up with our clinic in 4 weeks. She was informed of the importance of frequent follow-up visits to maximize her success with intensive lifestyle modifications for her multiple health conditions.   Katherine Tanner was informed we would discuss her lab results at her next visit unless there is a critical issue that needs to be addressed sooner. Katherine Tanner agreed to keep her next visit at the agreed upon time to discuss these results.  Objective:   Blood pressure 130/74, pulse 74, temperature 98.2 F (36.8 C), height '5\' 1"'$  (1.549 m), weight 154 lb (69.9 kg), last menstrual period 02/20/2014, SpO2 100 %. Body mass index is 29.1 kg/m.  General: Cooperative, alert, well developed, in no acute distress. HEENT: Conjunctivae and lids unremarkable. Cardiovascular: Regular rhythm.  Lungs: Normal work of breathing. Neurologic: No focal deficits.   Lab Results  Component Value Date   CREATININE 0.65 10/12/2020   BUN 13 10/12/2020   NA 143 10/12/2020   K 4.4 10/12/2020   CL 102 10/12/2020   CO2 25 10/12/2020   Lab Results  Component Value Date   ALT 39 (H) 10/12/2020   AST 24 10/12/2020   ALKPHOS 140 (H) 10/12/2020   BILITOT 0.4 10/12/2020   Lab Results  Component Value Date   HGBA1C 5.6 10/12/2020   HGBA1C 5.8 07/07/2020   HGBA1C 5.7 (H) 03/23/2020   HGBA1C 5.6 12/24/2019   HGBA1C 5.5 09/11/2019   Lab Results  Component Value Date   INSULIN 6.0 10/12/2020   INSULIN 11.2 03/23/2020   INSULIN 6.2 12/24/2019   INSULIN 4.0 09/11/2019   INSULIN 9.8 05/06/2019   Lab Results  Component Value Date   TSH 1.32 07/07/2020   Lab Results  Component Value Date   CHOL 149 07/07/2020   HDL 61.90 07/07/2020   LDLCALC 77 07/07/2020   LDLDIRECT 147.9 12/17/2012   TRIG 53.0 07/07/2020   CHOLHDL 2  07/07/2020   Lab Results  Component Value Date   VD25OH 40.9 10/12/2020   VD25OH 42.01 07/07/2020   VD25OH 77.5 12/24/2019   Lab Results  Component Value Date   WBC 5.6 11/25/2020   HGB 13.3 11/25/2020   HCT 40.7 11/25/2020   MCV 87.2 11/25/2020   PLT 154 11/25/2020   Lab Results  Component Value Date   IRON 63 07/07/2020   Attestation Statements:   Reviewed by clinician on day of visit: allergies, medications, problem list, medical history, surgical history, family history, social history, and previous encounter notes.  I, Water quality scientist, CMA, am acting as Location manager for Mina Marble, NP.  I have reviewed the above documentation for accuracy and completeness, and I agree with the above. -  Aasiyah Auerbach d. Cleatus Gabriel, NP-C

## 2021-03-23 ENCOUNTER — Encounter: Payer: Self-pay | Admitting: Family Medicine

## 2021-03-23 ENCOUNTER — Other Ambulatory Visit: Payer: Self-pay | Admitting: Family Medicine

## 2021-03-23 LAB — COMPREHENSIVE METABOLIC PANEL
ALT: 22 IU/L (ref 0–32)
AST: 19 IU/L (ref 0–40)
Albumin/Globulin Ratio: 1.7 (ref 1.2–2.2)
Albumin: 4.3 g/dL (ref 3.8–4.9)
Alkaline Phosphatase: 126 IU/L — ABNORMAL HIGH (ref 44–121)
BUN/Creatinine Ratio: 32 — ABNORMAL HIGH (ref 9–23)
BUN: 20 mg/dL (ref 6–24)
Bilirubin Total: 0.3 mg/dL (ref 0.0–1.2)
CO2: 25 mmol/L (ref 20–29)
Calcium: 9.9 mg/dL (ref 8.7–10.2)
Chloride: 99 mmol/L (ref 96–106)
Creatinine, Ser: 0.63 mg/dL (ref 0.57–1.00)
Globulin, Total: 2.6 g/dL (ref 1.5–4.5)
Glucose: 88 mg/dL (ref 65–99)
Potassium: 4.3 mmol/L (ref 3.5–5.2)
Sodium: 140 mmol/L (ref 134–144)
Total Protein: 6.9 g/dL (ref 6.0–8.5)
eGFR: 103 mL/min/{1.73_m2} (ref >59)

## 2021-03-23 LAB — LIPID PANEL
Chol/HDL Ratio: 2.3 ratio (ref 0.0–4.4)
Cholesterol, Total: 159 mg/dL (ref 100–199)
HDL: 68 mg/dL (ref >39)
LDL Chol Calc (NIH): 81 mg/dL (ref 0–99)
Triglycerides: 45 mg/dL (ref 0–149)
VLDL Cholesterol Cal: 10 mg/dL (ref 5–40)

## 2021-03-23 LAB — HEMOGLOBIN A1C
Est. average glucose Bld gHb Est-mCnc: 120 mg/dL
Hgb A1c MFr Bld: 5.8 % — ABNORMAL HIGH (ref 4.8–5.6)

## 2021-03-23 LAB — VITAMIN B12: Vitamin B-12: >2000 pg/mL — ABNORMAL HIGH (ref 232–1245)

## 2021-03-23 LAB — INSULIN, RANDOM: INSULIN: 6.9 u[IU]/mL (ref 2.6–24.9)

## 2021-03-23 LAB — VITAMIN D 25 HYDROXY (VIT D DEFICIENCY, FRACTURES): Vit D, 25-Hydroxy: 69.3 ng/mL (ref 30.0–100.0)

## 2021-03-23 MED ORDER — ROSUVASTATIN CALCIUM 10 MG PO TABS
ORAL_TABLET | Freq: Every day | ORAL | 3 refills | Status: DC
  Filled 2021-03-23: qty 90, 90d supply, fill #0
  Filled 2021-06-13: qty 90, 90d supply, fill #1

## 2021-03-23 NOTE — Telephone Encounter (Signed)
See mychart. Pt had labs done at her weight management appt. Results are in [facility]

## 2021-03-23 NOTE — Telephone Encounter (Signed)
Good cholesterol profile on crestor

## 2021-03-24 ENCOUNTER — Other Ambulatory Visit (HOSPITAL_COMMUNITY): Payer: Self-pay

## 2021-03-25 ENCOUNTER — Ambulatory Visit: Payer: 59 | Admitting: Internal Medicine

## 2021-03-26 ENCOUNTER — Other Ambulatory Visit (HOSPITAL_COMMUNITY): Payer: Self-pay

## 2021-03-30 ENCOUNTER — Other Ambulatory Visit: Payer: Self-pay

## 2021-03-30 ENCOUNTER — Ambulatory Visit
Admission: RE | Admit: 2021-03-30 | Discharge: 2021-03-30 | Disposition: A | Discharge status: Home / Self Care | Payer: 59 | Source: Ambulatory Visit

## 2021-03-30 DIAGNOSIS — Z1231 Encounter for screening mammogram for malignant neoplasm of breast: Secondary | ICD-10-CM | POA: Diagnosis not present

## 2021-04-02 ENCOUNTER — Other Ambulatory Visit (HOSPITAL_COMMUNITY): Payer: Self-pay

## 2021-04-16 MED FILL — Albuterol Sulfate Inhal Aero 108 MCG/ACT (90MCG Base Equiv): RESPIRATORY_TRACT | 25 days supply | Qty: 18 | Fill #1 | Status: AC

## 2021-04-17 ENCOUNTER — Other Ambulatory Visit (HOSPITAL_COMMUNITY): Payer: Self-pay

## 2021-04-19 ENCOUNTER — Other Ambulatory Visit (HOSPITAL_COMMUNITY): Payer: Self-pay

## 2021-04-19 DIAGNOSIS — M25569 Pain in unspecified knee: Secondary | ICD-10-CM | POA: Diagnosis not present

## 2021-04-19 DIAGNOSIS — G4700 Insomnia, unspecified: Secondary | ICD-10-CM | POA: Diagnosis not present

## 2021-04-19 DIAGNOSIS — G894 Chronic pain syndrome: Secondary | ICD-10-CM | POA: Diagnosis not present

## 2021-04-19 DIAGNOSIS — M7552 Bursitis of left shoulder: Secondary | ICD-10-CM | POA: Diagnosis not present

## 2021-04-19 MED ORDER — TRAMADOL HCL 50 MG PO TABS
50.0000 mg | ORAL_TABLET | Freq: Four times a day (QID) | ORAL | 1 refills | Status: DC | PRN
  Filled 2021-04-19: qty 240, 30d supply, fill #0
  Filled 2021-06-02: qty 240, 30d supply, fill #1

## 2021-04-19 MED ORDER — DULOXETINE HCL 60 MG PO CPEP
60.0000 mg | ORAL_CAPSULE | Freq: Every evening | ORAL | 1 refills | Status: DC
  Filled 2021-04-19: qty 30, 30d supply, fill #0
  Filled 2021-05-24: qty 30, 30d supply, fill #1

## 2021-04-21 ENCOUNTER — Other Ambulatory Visit: Payer: Self-pay

## 2021-04-21 ENCOUNTER — Ambulatory Visit (INDEPENDENT_AMBULATORY_CARE_PROVIDER_SITE_OTHER): Payer: 59 | Admitting: Adult Health

## 2021-04-21 ENCOUNTER — Encounter (INDEPENDENT_AMBULATORY_CARE_PROVIDER_SITE_OTHER): Payer: Self-pay | Admitting: Adult Health

## 2021-04-21 ENCOUNTER — Other Ambulatory Visit (HOSPITAL_COMMUNITY): Payer: Self-pay

## 2021-04-21 VITALS — BP 121/66 | HR 78 | Temp 98.2°F | Ht 61.0 in | Wt 156.0 lb

## 2021-04-21 DIAGNOSIS — Z9189 Other specified personal risk factors, not elsewhere classified: Secondary | ICD-10-CM

## 2021-04-21 DIAGNOSIS — R7303 Prediabetes: Secondary | ICD-10-CM

## 2021-04-21 DIAGNOSIS — Z6834 Body mass index (BMI) 34.0-34.9, adult: Secondary | ICD-10-CM

## 2021-04-21 DIAGNOSIS — E669 Obesity, unspecified: Secondary | ICD-10-CM

## 2021-04-21 DIAGNOSIS — E559 Vitamin D deficiency, unspecified: Secondary | ICD-10-CM | POA: Diagnosis not present

## 2021-04-21 MED ORDER — METFORMIN HCL 500 MG PO TABS
500.0000 mg | ORAL_TABLET | Freq: Two times a day (BID) | ORAL | 0 refills | Status: DC
  Filled 2021-04-21: qty 60, 30d supply, fill #0

## 2021-04-21 NOTE — Progress Notes (Signed)
Chief Complaint:   OBESITY Katherine Tanner is here to discuss her progress with her obesity treatment plan along with follow-up of her obesity related diagnoses. Katherine Tanner is on the Category 2 Plan and states she is following her eating plan approximately 100% of the time. Katherine Tanner states she is doing exercise videos for 30 minutes 3 times per week.  Today's visit was #: 57 Starting weight: 198 lbs Starting date: 08/30/2018 Today's weight: 156 lbs Today's date: 04/21/2021 Total lbs lost to date: 42 lbs Total lbs lost since last in-office visit: 0  Interim History: Katherine Tanner says that when stressed at work, it will impact weekly exercise (unable to find time to go to gym/pool).   She will try to make up exercise on the weekend.  She has experienced increased polyphagia in late afternoon/early evening.  Subjective:   1. Vitamin D deficiency Vitamin D level on 03/22/2021 - 69.3. She is on OTC vitamin D3 1,000 and OTC multivitamin.   2. Prediabetes On 03/22/2021, A1c was 5.8.  She has increased cravings/hunger over the last several weeks.   She is on metformin 500 mg at 1100 am. On 03/22/2021, CMP shows GFR of 103.  3. At risk for diarrhea Nayanna is at higher risk of diarrhea due to increasing metformin to BID for worsening prediabetes.   Assessment/Plan:   1. Vitamin D deficiency Continue current OTC supplement.  2. Prediabetes Increase metformin 500 mg with breakfast and lunch, as per below.  - Increase metFORMIN (GLUCOPHAGE) 500 MG tablet; Take 1 tablet by mouth 2 times daily with a meal. Take 1 tablet with breakfast and 1 tablet with lunch  Dispense: 60 tablet; Refill: 0  3. At risk for diarrhea Katherine Tanner was given approximately 15 minutes of diarrhea prevention counseling today. She is 60 y.o. female and has risk factors for diarrhea including medications and changes in diet. We discussed intensive lifestyle modifications today with an emphasis on specific weight loss instructions including  dietary strategies.   Repetitive spaced learning was employed today to elicit superior memory formation and behavioral change.   4. Obesity with current BMI 29.5  Katherine Tanner is currently in the action stage of change. As such, her goal is to continue with weight loss efforts. She has agreed to the Category 2 Plan.   Exercise goals:  As is.  Behavioral modification strategies: increasing lean protein intake, decreasing simple carbohydrates, meal planning and cooking strategies, keeping healthy foods in the home, and planning for success.  Katherine Tanner has agreed to follow-up with our clinic in 4 weeks. She was informed of the importance of frequent follow-up visits to maximize her success with intensive lifestyle modifications for her multiple health conditions.   Objective:   Blood pressure 121/66, pulse 78, temperature 98.2 F (36.8 C), height 5\' 1"  (1.549 m), weight 156 lb (70.8 kg), last menstrual period 02/20/2014, SpO2 97 %. Body mass index is 29.48 kg/m.  General: Cooperative, alert, well developed, in no acute distress. HEENT: Conjunctivae and lids unremarkable. Cardiovascular: Regular rhythm.  Lungs: Normal work of breathing. Neurologic: No focal deficits.   Lab Results  Component Value Date   CREATININE 0.63 03/22/2021   BUN 20 03/22/2021   NA 140 03/22/2021   K 4.3 03/22/2021   CL 99 03/22/2021   CO2 25 03/22/2021   Lab Results  Component Value Date   ALT 22 03/22/2021   AST 19 03/22/2021   ALKPHOS 126 (H) 03/22/2021   BILITOT 0.3 03/22/2021   Lab Results  Component  Value Date   HGBA1C 5.8 (H) 03/22/2021   HGBA1C 5.6 10/12/2020   HGBA1C 5.8 07/07/2020   HGBA1C 5.7 (H) 03/23/2020   HGBA1C 5.6 12/24/2019   Lab Results  Component Value Date   INSULIN 6.9 03/22/2021   INSULIN 6.0 10/12/2020   INSULIN 11.2 03/23/2020   INSULIN 6.2 12/24/2019   INSULIN 4.0 09/11/2019   Lab Results  Component Value Date   TSH 1.32 07/07/2020   Lab Results  Component Value Date    CHOL 159 03/22/2021   HDL 68 03/22/2021   LDLCALC 81 03/22/2021   LDLDIRECT 147.9 12/17/2012   TRIG 45 03/22/2021   CHOLHDL 2.3 03/22/2021   Lab Results  Component Value Date   VD25OH 69.3 03/22/2021   VD25OH 40.9 10/12/2020   VD25OH 42.01 07/07/2020   Lab Results  Component Value Date   WBC 5.6 11/25/2020   HGB 13.3 11/25/2020   HCT 40.7 11/25/2020   MCV 87.2 11/25/2020   PLT 154 11/25/2020   Lab Results  Component Value Date   IRON 63 07/07/2020   Attestation Statements:   Reviewed by clinician on day of visit: allergies, medications, problem list, medical history, surgical history, family history, social history, and previous encounter notes.  I, Water quality scientist, CMA, am acting as Location manager for Mina Marble, NP.  I have reviewed the above documentation for accuracy and completeness, and I agree with the above. -  Aima Mcwhirt d. Tiquan Bouch, NP-C

## 2021-04-22 ENCOUNTER — Other Ambulatory Visit (HOSPITAL_COMMUNITY): Payer: Self-pay

## 2021-04-23 ENCOUNTER — Other Ambulatory Visit (HOSPITAL_COMMUNITY): Payer: Self-pay

## 2021-04-23 MED FILL — Montelukast Sodium Tab 10 MG (Base Equiv): ORAL | 90 days supply | Qty: 90 | Fill #2 | Status: AC

## 2021-05-05 ENCOUNTER — Other Ambulatory Visit (HOSPITAL_COMMUNITY): Payer: Self-pay

## 2021-05-05 DIAGNOSIS — M7552 Bursitis of left shoulder: Secondary | ICD-10-CM | POA: Diagnosis not present

## 2021-05-05 DIAGNOSIS — G894 Chronic pain syndrome: Secondary | ICD-10-CM | POA: Diagnosis not present

## 2021-05-05 DIAGNOSIS — G4700 Insomnia, unspecified: Secondary | ICD-10-CM | POA: Diagnosis not present

## 2021-05-05 DIAGNOSIS — M25569 Pain in unspecified knee: Secondary | ICD-10-CM | POA: Diagnosis not present

## 2021-05-05 MED ORDER — MELOXICAM 15 MG PO TABS
ORAL_TABLET | ORAL | 0 refills | Status: DC
  Filled 2021-05-05: qty 30, 30d supply, fill #0

## 2021-05-11 ENCOUNTER — Other Ambulatory Visit (HOSPITAL_COMMUNITY): Payer: Self-pay

## 2021-05-19 ENCOUNTER — Other Ambulatory Visit: Payer: Self-pay

## 2021-05-19 ENCOUNTER — Encounter (INDEPENDENT_AMBULATORY_CARE_PROVIDER_SITE_OTHER): Payer: Self-pay | Admitting: Adult Health

## 2021-05-19 ENCOUNTER — Ambulatory Visit (INDEPENDENT_AMBULATORY_CARE_PROVIDER_SITE_OTHER): Payer: 59 | Admitting: Adult Health

## 2021-05-19 ENCOUNTER — Other Ambulatory Visit (HOSPITAL_COMMUNITY): Payer: Self-pay

## 2021-05-19 VITALS — BP 126/69 | HR 84 | Temp 98.1°F | Ht 61.0 in | Wt 153.0 lb

## 2021-05-19 DIAGNOSIS — Z9189 Other specified personal risk factors, not elsewhere classified: Secondary | ICD-10-CM

## 2021-05-19 DIAGNOSIS — R7303 Prediabetes: Secondary | ICD-10-CM | POA: Diagnosis not present

## 2021-05-19 DIAGNOSIS — E669 Obesity, unspecified: Secondary | ICD-10-CM | POA: Diagnosis not present

## 2021-05-19 DIAGNOSIS — E559 Vitamin D deficiency, unspecified: Secondary | ICD-10-CM

## 2021-05-19 DIAGNOSIS — Z6834 Body mass index (BMI) 34.0-34.9, adult: Secondary | ICD-10-CM | POA: Diagnosis not present

## 2021-05-19 MED ORDER — METFORMIN HCL 500 MG PO TABS
500.0000 mg | ORAL_TABLET | Freq: Two times a day (BID) | ORAL | 0 refills | Status: DC
  Filled 2021-05-19: qty 60, 30d supply, fill #0

## 2021-05-20 ENCOUNTER — Other Ambulatory Visit (HOSPITAL_COMMUNITY): Payer: Self-pay

## 2021-05-20 NOTE — Progress Notes (Signed)
Chief Complaint:   OBESITY Katherine Tanner is here to discuss her progress with her obesity treatment plan along with follow-up of her obesity related diagnoses. Katherine Tanner is on the Category 2 Plan and states she is following her eating plan approximately 100% of the time. Katherine Tanner states she is doing chair exercising and biking for 30 minutes 5 times per week.  Today's visit was #: 48 Starting weight: 198 lbs Starting date: 08/30/2018 Today's weight: 153 lbs Today's date: 05/19/2021 Total lbs lost to date: 45 lbs Total lbs lost since last in-office visit: 3 lbs  Interim History:  On 04/21/2021, metformin 500 mg was increased from QD to BID at last office visit - Mykira endorses decreased hunger levels.   She denies GI upset.  Reviewed bioimpedance with patient.  Subjective:   1. Prediabetes On 04/21/2021, metformin 500 mg was increased from QD to BID at last office visit - Lorma endorses decreased hunger levels.   She denies GI upset  2. Vitamin D deficiency On 03/22/2021, vitamin D level - 69.3. She is on OTC vitamin D3 1,000 IU QD, OTC Alive multivitamin.   3. At risk for diarrhea Katherine Tanner is at higher risk of diarrhea due to increasing metformin for prediabetes.   Assessment/Plan:   1. Prediabetes Check fasting labs at next office visit. Refill metformin 500 mg BID, as per below.  - Refill metFORMIN (GLUCOPHAGE) 500 MG tablet; Take 1 tablet by mouth 2 times daily (with breakfast and with lunch.)  Dispense: 60 tablet; Refill: 0  2. Vitamin D deficiency Continue OTC supplement.  3. At risk for diarrhea Katherine Tanner was given approximately 15 minutes of diarrhea prevention counseling today. She is 60 y.o. female and has risk factors for diarrhea including medications and changes in diet. We discussed intensive lifestyle modifications today with an emphasis on specific weight loss instructions including dietary strategies.   Repetitive spaced learning was employed today to elicit  superior memory formation and behavioral change.  4. Obesity with current BMI 29.1  Katherine Tanner is currently in the action stage of change. As such, her goal is to continue with weight loss efforts. She has agreed to the Category 2 Plan.   Check fasting labs at next office visit.  Exercise goals:  As is.  Behavioral modification strategies: increasing lean protein intake, decreasing simple carbohydrates, meal planning and cooking strategies, keeping healthy foods in the home, and avoiding temptations.  Katherine Tanner has agreed to follow-up with our clinic in 4 weeks, fasting. She was informed of the importance of frequent follow-up visits to maximize her success with intensive lifestyle modifications for her multiple health conditions.   Objective:   Blood pressure 126/69, pulse 84, temperature 98.1 F (36.7 C), height 5\' 1"  (1.549 m), weight 153 lb (69.4 kg), last menstrual period 02/20/2014, SpO2 100 %. Body mass index is 28.91 kg/m.  General: Cooperative, alert, well developed, in no acute distress. HEENT: Conjunctivae and lids unremarkable. Cardiovascular: Regular rhythm.  Lungs: Normal work of breathing. Neurologic: No focal deficits.   Lab Results  Component Value Date   CREATININE 0.63 03/22/2021   BUN 20 03/22/2021   NA 140 03/22/2021   K 4.3 03/22/2021   CL 99 03/22/2021   CO2 25 03/22/2021   Lab Results  Component Value Date   ALT 22 03/22/2021   AST 19 03/22/2021   ALKPHOS 126 (H) 03/22/2021   BILITOT 0.3 03/22/2021   Lab Results  Component Value Date   HGBA1C 5.8 (H) 03/22/2021   HGBA1C  5.6 10/12/2020   HGBA1C 5.8 07/07/2020   HGBA1C 5.7 (H) 03/23/2020   HGBA1C 5.6 12/24/2019   Lab Results  Component Value Date   INSULIN 6.9 03/22/2021   INSULIN 6.0 10/12/2020   INSULIN 11.2 03/23/2020   INSULIN 6.2 12/24/2019   INSULIN 4.0 09/11/2019   Lab Results  Component Value Date   TSH 1.32 07/07/2020   Lab Results  Component Value Date   CHOL 159 03/22/2021    HDL 68 03/22/2021   LDLCALC 81 03/22/2021   LDLDIRECT 147.9 12/17/2012   TRIG 45 03/22/2021   CHOLHDL 2.3 03/22/2021   Lab Results  Component Value Date   VD25OH 69.3 03/22/2021   VD25OH 40.9 10/12/2020   VD25OH 42.01 07/07/2020   Lab Results  Component Value Date   WBC 5.6 11/25/2020   HGB 13.3 11/25/2020   HCT 40.7 11/25/2020   MCV 87.2 11/25/2020   PLT 154 11/25/2020   Lab Results  Component Value Date   IRON 63 07/07/2020   Attestation Statements:   Reviewed by clinician on day of visit: allergies, medications, problem list, medical history, surgical history, family history, social history, and previous encounter notes.  I, Water quality scientist, CMA, am acting as Location manager for Mina Marble, NP.  I have reviewed the above documentation for accuracy and completeness, and I agree with the above. -  Ameshia Pewitt d. Tywanna Seifer, NP-C

## 2021-05-24 ENCOUNTER — Other Ambulatory Visit (HOSPITAL_COMMUNITY): Payer: Self-pay

## 2021-05-24 DIAGNOSIS — Z01419 Encounter for gynecological examination (general) (routine) without abnormal findings: Secondary | ICD-10-CM | POA: Diagnosis not present

## 2021-05-24 DIAGNOSIS — R232 Flushing: Secondary | ICD-10-CM | POA: Diagnosis not present

## 2021-05-25 ENCOUNTER — Ambulatory Visit (INDEPENDENT_AMBULATORY_CARE_PROVIDER_SITE_OTHER): Payer: 59

## 2021-05-25 ENCOUNTER — Other Ambulatory Visit (HOSPITAL_COMMUNITY): Payer: Self-pay

## 2021-05-25 ENCOUNTER — Ambulatory Visit: Payer: 59 | Admitting: Internal Medicine

## 2021-05-25 ENCOUNTER — Other Ambulatory Visit: Payer: Self-pay

## 2021-05-25 ENCOUNTER — Encounter: Payer: Self-pay | Admitting: Internal Medicine

## 2021-05-25 VITALS — BP 112/68 | HR 78 | Temp 98.3°F | Ht 61.0 in | Wt 156.6 lb

## 2021-05-25 DIAGNOSIS — R0600 Dyspnea, unspecified: Secondary | ICD-10-CM | POA: Diagnosis not present

## 2021-05-25 DIAGNOSIS — J4531 Mild persistent asthma with (acute) exacerbation: Secondary | ICD-10-CM | POA: Diagnosis not present

## 2021-05-25 DIAGNOSIS — J3089 Other allergic rhinitis: Secondary | ICD-10-CM

## 2021-05-25 DIAGNOSIS — J302 Other seasonal allergic rhinitis: Secondary | ICD-10-CM

## 2021-05-25 DIAGNOSIS — R0609 Other forms of dyspnea: Secondary | ICD-10-CM | POA: Diagnosis not present

## 2021-05-25 MED ORDER — BREZTRI AEROSPHERE 160-9-4.8 MCG/ACT IN AERO: 2.0000 | INHALATION_SPRAY | Freq: Two times a day (BID) | RESPIRATORY_TRACT | 0 refills | Status: DC

## 2021-05-25 NOTE — Assessment & Plan Note (Signed)
She is not wheezing and I am not confident that she is describing asthma now. Plan-try sample Breztri, CXR, PFT

## 2021-05-25 NOTE — Progress Notes (Signed)
Patient ID: Katherine Tanner, female    DOB: 09/21/1960, 60 y.o.   MRN: [id number]  HPI female never smoker followed for Allergic rhinitis, Asthma,OSA, complicated by GERD Office spirometry: 11/24/2014: WNL FVC 2.39/97%, FEV1 2.09/103%, FEV1/FVC 0.88, FEF 25-75 percent 3.27/122% Office Spirometry 07/06/17-WNL-FVC 2.25/91%, FEV1 2.04/104%, ratio 0.91, FEF 25-75% 3.62/173% FENO 10/09/17   11   Not elevated Methacholine inhalation challenge test 01/25/2018-interpreted as positive because of initiation of hard coughing although FEV1 only declined by 15%. HST 10/27/2017-AHI 7.3/hour, desaturation to 88%, body weight 193 pounds ----------------------------------------------------------------------------------   09/10/20- 60 year old female never smoker followed for Allergic rhinitis, Asthma,  Hx OSA(minimal- no CPAP/ lost weight), complicated by GERD, HTN, Thrombocytopenia, PreDiabetes,  Neb Duoneb, Singulair, Advair 250, Flonase, albuterol hfa,  Epipen for Shellfish allergy  Covid vax-3 Phizer Flu vax-no Body weight today 160 lbs. Has reached her goal, aims now to stabilize. Using rescue hfa 2x/ week, never needing nebulizer or Epipen- avoids shellfish. Needs refills. Cold air tightens occasionally. No Spring seasonal trouble yet.  No acute events. Being watched for thrombocytopenia- potential ITP.  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  Has worked hard to reduce weight under physician supervision. Since onset of fall weather she has noted more dyspnea on exertion with little cough or wheeze.  Occasionally feels smothered lying  supine.  Rescue inhaler does help some, used 2-3 times per week.  Has to stop to rest as she walks from her car in to work in the morning.  Describes a pressure sensation over her chest and back is nonexertional.  Unclear how much of this may be musculoskeletal.  I do not think she is describing cardiac pain.  Review of Systems-Per HPI.   + = positive Constitutional:   +diet>   weight loss, night sweats, fevers, chills, fatigue, lassitude. HEENT:   +  headaches, difficulty swallowing, tooth/dental problems, sore throat,       No-sneezing, itching, no-ear ache, + nasal congestion, no- post nasal drip,  CV:   chest pain, orthopnea, PND, swelling in lower extremities, anasarca, dizziness, palpitations Resp: +shortness of breath with exertion or at rest.             productive cough,  No- non-productive cough,  No- coughing up of blood.              No-   change in color of mucus. No- wheezing.   Skin:  GI:  No-   heartburn, indigestion, abdominal pain, nausea, vomiting,  GU: . MS:  No-   joint pain or swelling.  . Neuro-     nothing unusual Psych:  No- change in mood or affect. No depression or anxiety.  No memory loss.  Objective:   Physical Exam General- Alert, Oriented, Affect-appropriate, Distress- none acute;  Skin- rash-none, lesions- none, excoriation- none Lymphadenopathy- none Head- atraumatic            Eyes- Gross vision intact, PERRLA, conjunctivae clear, no discolored secretions            Ears- Hearing, canals-normal            Nose-    no-Septal dev, mucus, polyps, erosion, perforation  Throat- Mallampati III , mucosa clear , drainage- none, tonsils- atrophic Neck- flexible , trachea midline, no stridor , thyroid nl, carotid no bruit Chest - symmetrical excursion , unlabored           Heart/CV- RRR , no murmur , no gallop  , no rub, nl s1 s2                           - JVD- none , edema- none, stasis changes- none, varices- none           Lung- unlabored, wheeze-  none, cough- none , dullness-none, rub- none,            Chest wall-  Abd-  Br/ Gen/ Rectal- Not done, not indicated Extrem- cyanosis- none, clubbing, none, atrophy- none, strength- nl Neuro- grossly intact to observation

## 2021-05-25 NOTE — Assessment & Plan Note (Signed)
Mild seasonal stuffiness and drainage.  Flonase and occasional antihistamine should be sufficient.

## 2021-05-25 NOTE — Patient Instructions (Addendum)
Order- sample Breztri    Inhale 2 puffs then rinse mouth, twice daily. Try this instead of Advair. When the sample runs out, call for a prescription, or go back to Advair.  Order- CXR- dx dyspnea on exertion  Order- schedule PFT    dx asthma mild persistent, dyspnea on exertion  Please call as needed

## 2021-05-31 ENCOUNTER — Other Ambulatory Visit (HOSPITAL_COMMUNITY): Payer: Self-pay

## 2021-06-02 ENCOUNTER — Telehealth: Payer: Self-pay | Admitting: Internal Medicine

## 2021-06-02 ENCOUNTER — Other Ambulatory Visit (HOSPITAL_COMMUNITY): Payer: Self-pay

## 2021-06-02 MED ORDER — BREZTRI AEROSPHERE 160-9-4.8 MCG/ACT IN AERO
2.0000 | INHALATION_SPRAY | Freq: Two times a day (BID) | RESPIRATORY_TRACT | 5 refills | Status: DC
Start: 2021-06-02 — End: 2021-07-16
  Filled 2021-06-02: qty 10.7, 30d supply, fill #0

## 2021-06-02 NOTE — Telephone Encounter (Signed)
I have called the patient and let her know that the refill was sent into [facility] and she did not have any questions.

## 2021-06-03 ENCOUNTER — Other Ambulatory Visit (HOSPITAL_COMMUNITY): Payer: Self-pay

## 2021-06-13 ENCOUNTER — Encounter: Payer: Self-pay | Admitting: Internal Medicine

## 2021-06-13 MED FILL — Albuterol Sulfate Inhal Aero 108 MCG/ACT (90MCG Base Equiv): RESPIRATORY_TRACT | 25 days supply | Qty: 18 | Fill #2 | Status: AC

## 2021-06-14 ENCOUNTER — Other Ambulatory Visit (HOSPITAL_COMMUNITY): Payer: Self-pay

## 2021-06-14 DIAGNOSIS — M7552 Bursitis of left shoulder: Secondary | ICD-10-CM | POA: Diagnosis not present

## 2021-06-14 DIAGNOSIS — M25569 Pain in unspecified knee: Secondary | ICD-10-CM | POA: Diagnosis not present

## 2021-06-14 DIAGNOSIS — G4700 Insomnia, unspecified: Secondary | ICD-10-CM | POA: Diagnosis not present

## 2021-06-14 DIAGNOSIS — G894 Chronic pain syndrome: Secondary | ICD-10-CM | POA: Diagnosis not present

## 2021-06-14 MED ORDER — DULOXETINE HCL 60 MG PO CPEP
60.0000 mg | ORAL_CAPSULE | Freq: Every evening | ORAL | 1 refills | Status: DC
  Filled 2021-06-14: qty 30, 30d supply, fill #0
  Filled 2021-07-18: qty 30, 30d supply, fill #1

## 2021-06-14 MED ORDER — BACLOFEN 10 MG PO TABS
10.0000 mg | ORAL_TABLET | Freq: Two times a day (BID) | ORAL | 3 refills | Status: DC | PRN
  Filled 2021-06-14 – 2021-08-06 (×2): qty 60, 30d supply, fill #0
  Filled 2021-09-12: qty 60, 30d supply, fill #1
  Filled 2021-10-14: qty 60, 30d supply, fill #2
  Filled 2021-11-20: qty 60, 30d supply, fill #3

## 2021-06-14 MED ORDER — MELOXICAM 15 MG PO TABS
15.0000 mg | ORAL_TABLET | Freq: Every day | ORAL | 1 refills | Status: DC
  Filled 2021-06-14: qty 30, 30d supply, fill #0
  Filled 2021-08-06: qty 30, 30d supply, fill #1

## 2021-06-14 MED ORDER — TRAMADOL HCL 50 MG PO TABS
ORAL_TABLET | ORAL | 1 refills | Status: DC
  Filled 2021-07-18: qty 240, 30d supply, fill #0
  Filled 2021-10-14: qty 240, 30d supply, fill #1

## 2021-06-14 NOTE — Telephone Encounter (Signed)
Ok. We will make note about Breztri. We can refill Advair if you need.

## 2021-06-17 ENCOUNTER — Other Ambulatory Visit (HOSPITAL_COMMUNITY): Payer: Self-pay

## 2021-06-21 ENCOUNTER — Telehealth: Payer: 59 | Admitting: Nurse Practitioner

## 2021-06-21 DIAGNOSIS — J0140 Acute pansinusitis, unspecified: Secondary | ICD-10-CM | POA: Diagnosis not present

## 2021-06-21 MED ORDER — DOXYCYCLINE HYCLATE 100 MG PO TABS: 100.0000 mg | ORAL_TABLET | Freq: Two times a day (BID) | ORAL | 0 refills | Status: AC

## 2021-06-21 NOTE — Progress Notes (Signed)
E-Visit for Sinus Problems  We are sorry that you are not feeling well.  Here is how we plan to help!  Based on what you have shared with me it looks like you have sinusitis.  Sinusitis is inflammation and infection in the sinus cavities of the head.  Based on your presentation I believe you most likely have Acute Bacterial Sinusitis.  This is an infection caused by bacteria and is treated with antibiotics. I have prescribed Doxycycline 100mg  by mouth twice a day for 10 days. You may use an oral decongestant such as Mucinex D or if you have glaucoma or high blood pressure use plain Mucinex. Saline nasal spray help and can safely be used as often as needed for congestion.  If you develop worsening sinus pain, fever or notice severe headache and vision changes, or if symptoms are not better after completion of antibiotic, please schedule an appointment with a health care provider.    Sinus infections are not as easily transmitted as other respiratory infection, however we still recommend that you avoid close contact with loved ones, especially the very young and elderly.  Remember to wash your hands thoroughly throughout the day as this is the number one way to prevent the spread of infection!  Home Care: Only take medications as instructed by your medical team. Complete the entire course of an antibiotic. Do not take these medications with alcohol. A steam or ultrasonic humidifier can help congestion.  You can place a towel over your head and breathe in the steam from hot water coming from a faucet. Avoid close contacts especially the very young and the elderly. Cover your mouth when you cough or sneeze. Always remember to wash your hands.  Get Help Right Away If: You develop worsening fever or sinus pain. You develop a severe head ache or visual changes. Your symptoms persist after you have completed your treatment plan.  Make sure you Understand these instructions. Will watch your  condition. Will get help right away if you are not doing well or get worse.  Thank you for choosing an e-visit.  Your e-visit answers were reviewed by a board certified advanced clinical practitioner to complete your personal care plan. Depending upon the condition, your plan could have included both over the counter or prescription medications.  Please review your pharmacy choice. Make sure the pharmacy is open so you can pick up prescription now. If there is a problem, you may contact your provider through [employer] and have the prescription routed to another pharmacy.  Your safety is important to [name]. If you have drug allergies check your prescription carefully.   For the next 24 hours you can use MyChart to ask questions about today's visit, request a non-urgent call back, or ask for a work or school excuse. You will get an email in the next two days asking about your experience. I hope that your e-visit has been valuable and will speed your recovery.   I spent approximately 7 minutes reviewing the patient's history, current symptoms and coordinating their plan of care today.    Meds ordered this encounter  Medications   doxycycline (VIBRA-TABS) 100 MG tablet    Sig: Take 1 tablet (100 mg total) by mouth 2 (two) times daily for 10 days.    Dispense:  20 tablet    Refill:  0

## 2021-06-24 ENCOUNTER — Encounter (INDEPENDENT_AMBULATORY_CARE_PROVIDER_SITE_OTHER): Payer: Self-pay | Admitting: Adult Health

## 2021-06-24 ENCOUNTER — Other Ambulatory Visit: Payer: Self-pay

## 2021-06-24 ENCOUNTER — Other Ambulatory Visit (HOSPITAL_COMMUNITY): Payer: Self-pay

## 2021-06-24 ENCOUNTER — Ambulatory Visit (INDEPENDENT_AMBULATORY_CARE_PROVIDER_SITE_OTHER): Payer: 59 | Admitting: Adult Health

## 2021-06-24 VITALS — BP 133/75 | HR 80 | Temp 98.3°F | Ht 61.0 in | Wt 153.0 lb

## 2021-06-24 DIAGNOSIS — E669 Obesity, unspecified: Secondary | ICD-10-CM

## 2021-06-24 DIAGNOSIS — Z6834 Body mass index (BMI) 34.0-34.9, adult: Secondary | ICD-10-CM | POA: Diagnosis not present

## 2021-06-24 DIAGNOSIS — I10 Essential (primary) hypertension: Secondary | ICD-10-CM | POA: Diagnosis not present

## 2021-06-24 DIAGNOSIS — E538 Deficiency of other specified B group vitamins: Secondary | ICD-10-CM | POA: Diagnosis not present

## 2021-06-24 DIAGNOSIS — E559 Vitamin D deficiency, unspecified: Secondary | ICD-10-CM | POA: Diagnosis not present

## 2021-06-24 DIAGNOSIS — R7303 Prediabetes: Secondary | ICD-10-CM | POA: Diagnosis not present

## 2021-06-24 DIAGNOSIS — Z9189 Other specified personal risk factors, not elsewhere classified: Secondary | ICD-10-CM

## 2021-06-24 MED ORDER — METFORMIN HCL 500 MG PO TABS
500.0000 mg | ORAL_TABLET | Freq: Two times a day (BID) | ORAL | 0 refills | Status: DC
  Filled 2021-06-24: qty 180, 90d supply, fill #0

## 2021-06-24 NOTE — Progress Notes (Signed)
Chief Complaint:   OBESITY Katherine Tanner is here to discuss her progress with her obesity treatment plan along with follow-up of her obesity related diagnoses. Katherine Tanner is on the Category 2 Plan and states she is following her eating plan approximately 100% of the time. Katherine Tanner states she is riding a bike and doing weights for 30 minutes 4-5 times per week.  Today's visit was #: 70 Starting weight: 198 lbs Starting date: 08/30/2018 Today's weight: 153 lbs Today's date: 06/23/2021 Total lbs lost to date: 45 lbs Total lbs lost since last in-office visit: 0  Interim History:  Vickii states " Eating right is just my lifestyle now!" She maintained her weight since last OV. She has lost total of 45 lbs with the program.  Subjective:   1. Pre-diabetes Katherine Tanner is currently on Metformin 500 mg twice daily with meals. She is tolerating Metformin.  2. Vitamin D deficiency Katherine Tanner is on OTC Vitamin D3 1,000 IU every day currently.  3. Essential hypertension Katherine Tanner's blood pressure was stable at office visit. She is on Hyzaar 100-25 mg every day.  4. B12 deficiency Katherine Tanner is on Vitamin B 12 supplement. She is unsure of dosage. She takes it every other day. She is also on OTC multivitamins.   5. At risk for diabetes mellitus Katherine Tanner is at higher than average risk for developing diabetes due to pre-diabetes.     Assessment/Plan:   1. Pre-diabetes We will refill Metformin 500 mg twice daily for 3 months with no refills. We will check labs today.Katherine Tanner will continue to work on weight loss, exercise, and decreasing simple carbohydrates to help decrease the risk of diabetes.  - Comprehensive metabolic panel - Hemoglobin A1c - Insulin, random - metFORMIN (GLUCOPHAGE) 500 MG tablet; Take 1 tablet by mouth 2 times daily (with breakfast and with lunch.)  Dispense: 180 tablet; Refill: 0  2. Vitamin D deficiency Low Vitamin D level contributes to fatigue and are associated with obesity, breast, and  colon cancer. We will check labs today. Katherine Tanner agrees to continue to take OTC Vitamin D 1,000IU every day and she will follow-up for routine testing of Vitamin D, at least 2-3 times per year to avoid over-replacement.  - VITAMIN D 25 Hydroxy (Vit-D Deficiency, Fractures)  3. Essential hypertension Cardiovascular risk and specific lipid/LDL goals reviewed.  We will check labs today. Katherine Tanner will continue regime of exercises. We discussed several lifestyle modifications today and Katherine Tanner will continue to work on diet, exercise and weight loss efforts. Orders and follow up as documented in patient record.   Counseling Intensive lifestyle modifications are the first line treatment for this issue. Dietary changes: Increase soluble fiber. Decrease simple carbohydrates. Exercise changes: Moderate to vigorous-intensity aerobic activity 150 minutes per week if tolerated. Lipid-lowering medications: see documented in medical record.  4. B12 deficiency The diagnosis was reviewed with the patient. Counseling provided today, see below. We will continue to monitor. We will check labs today.Orders and follow up as documented in patient record.  Counseling The body needs vitamin B12: to make red blood cells; to make DNA; and to help the nerves work properly so they can carry messages from the brain to the body.  The main causes of vitamin B12 deficiency include dietary deficiency, digestive diseases, pernicious anemia, and having a surgery in which part of the stomach or small intestine is removed.  Certain medicines can make it harder for the body to absorb vitamin B12. These medicines include: heartburn medications; some antibiotics; some medications  used to treat diabetes, gout, and high cholesterol.  In some cases, there are no symptoms of this condition. If the condition leads to anemia or nerve damage, various symptoms can occur, such as weakness or fatigue, shortness of breath, and numbness or tingling in  your hands and feet.   Treatment:  May include taking vitamin B12 supplements.  Avoid alcohol.  Eat lots of healthy foods that contain vitamin B12: Beef, pork, chicken, [place], and organ meats, such as liver.  Seafood: This includes clams, rainbow trout, salmon, tuna, and haddock. Eggs.  Cereal and dairy products that are fortified: This means that vitamin B12 has been added to the food.    5. At risk for diabetes mellitus Sebrena was given approximately 15 minutes of diabetes education and counseling today. We discussed intensive lifestyle modifications today with an emphasis on weight loss as well as increasing exercise and decreasing simple carbohydrates in her diet. We also reviewed medication options with an emphasis on risk versus benefit of those discussed.   Repetitive spaced learning was employed today to elicit superior memory formation and behavioral change.   6. Obesity with current BMI 28.9 Katherine Tanner is currently in the action stage of change. As such, her goal is to continue with weight loss efforts. She has agreed to the Category 2 Plan.   Exercise goals:  As is.  Behavioral modification strategies: increasing lean protein intake, decreasing simple carbohydrates, meal planning and cooking strategies, keeping healthy foods in the home, and planning for success.  Katherine Tanner has agreed to follow-up with our clinic in 4 weeks. She was informed of the importance of frequent follow-up visits to maximize her success with intensive lifestyle modifications for her multiple health conditions.   Katherine Tanner was informed we would discuss her lab results at her next visit unless there is a critical issue that needs to be addressed sooner. Katherine Tanner agreed to keep her next visit at the agreed upon time to discuss these results.  Objective:   Blood pressure 133/75, pulse 80, temperature 98.3 F (36.8 C), height 5\' 1"  (1.549 m), weight 153 lb (69.4 kg), last menstrual period 02/20/2014, SpO2 98 %. Body  mass index is 28.91 kg/m.  General: Cooperative, alert, well developed, in no acute distress. HEENT: Conjunctivae and lids unremarkable. Cardiovascular: Regular rhythm.  Lungs: Normal work of breathing. Neurologic: No focal deficits.   Lab Results  Component Value Date   CREATININE 0.63 03/22/2021   BUN 20 03/22/2021   NA 140 03/22/2021   K 4.3 03/22/2021   CL 99 03/22/2021   CO2 25 03/22/2021   Lab Results  Component Value Date   ALT 22 03/22/2021   AST 19 03/22/2021   ALKPHOS 126 (H) 03/22/2021   BILITOT 0.3 03/22/2021   Lab Results  Component Value Date   HGBA1C 5.8 (H) 03/22/2021   HGBA1C 5.6 10/12/2020   HGBA1C 5.8 07/07/2020   HGBA1C 5.7 (H) 03/23/2020   HGBA1C 5.6 12/24/2019   Lab Results  Component Value Date   INSULIN 6.9 03/22/2021   INSULIN 6.0 10/12/2020   INSULIN 11.2 03/23/2020   INSULIN 6.2 12/24/2019   INSULIN 4.0 09/11/2019   Lab Results  Component Value Date   TSH 1.32 07/07/2020   Lab Results  Component Value Date   CHOL 159 03/22/2021   HDL 68 03/22/2021   LDLCALC 81 03/22/2021   LDLDIRECT 147.9 12/17/2012   TRIG 45 03/22/2021   CHOLHDL 2.3 03/22/2021   Lab Results  Component Value Date   VD25OH  69.3 03/22/2021   VD25OH 40.9 10/12/2020   VD25OH 42.01 07/07/2020   Lab Results  Component Value Date   WBC 5.6 11/25/2020   HGB 13.3 11/25/2020   HCT 40.7 11/25/2020   MCV 87.2 11/25/2020   PLT 154 11/25/2020   Lab Results  Component Value Date   IRON 63 07/07/2020   Attestation Statements:   Reviewed by clinician on day of visit: allergies, medications, problem list, medical history, surgical history, family history, social history, and previous encounter notes.  I, Lizbeth Bark, RMA, am acting as Location manager for Mina Marble, NP.  I have reviewed the above documentation for accuracy and completeness, and I agree with the above. -  Hyder Deman d. Zyann Mabry, NP-C

## 2021-06-25 LAB — HEMOGLOBIN A1C
Est. average glucose Bld gHb Est-mCnc: 114 mg/dL
Hgb A1c MFr Bld: 5.6 % (ref 4.8–5.6)

## 2021-06-25 LAB — COMPREHENSIVE METABOLIC PANEL
ALT: 27 IU/L (ref 0–32)
AST: 24 IU/L (ref 0–40)
Albumin/Globulin Ratio: 2.2 (ref 1.2–2.2)
Albumin: 4.7 g/dL (ref 3.8–4.9)
Alkaline Phosphatase: 105 IU/L (ref 44–121)
BUN/Creatinine Ratio: 30 — ABNORMAL HIGH (ref 9–23)
BUN: 18 mg/dL (ref 6–24)
Bilirubin Total: 0.4 mg/dL (ref 0.0–1.2)
CO2: 29 mmol/L (ref 20–29)
Calcium: 9.8 mg/dL (ref 8.7–10.2)
Chloride: 103 mmol/L (ref 96–106)
Creatinine, Ser: 0.6 mg/dL (ref 0.57–1.00)
Globulin, Total: 2.1 g/dL (ref 1.5–4.5)
Glucose: 97 mg/dL (ref 70–99)
Potassium: 4.7 mmol/L (ref 3.5–5.2)
Sodium: 145 mmol/L — ABNORMAL HIGH (ref 134–144)
Total Protein: 6.8 g/dL (ref 6.0–8.5)
eGFR: 104 mL/min/{1.73_m2} (ref >59)

## 2021-06-25 LAB — INSULIN, RANDOM: INSULIN: 7 u[IU]/mL (ref 2.6–24.9)

## 2021-06-25 LAB — VITAMIN D 25 HYDROXY (VIT D DEFICIENCY, FRACTURES): Vit D, 25-Hydroxy: 79.7 ng/mL (ref 30.0–100.0)

## 2021-06-26 ENCOUNTER — Encounter: Payer: Self-pay | Admitting: Family Medicine

## 2021-06-26 DIAGNOSIS — E7800 Pure hypercholesterolemia, unspecified: Secondary | ICD-10-CM

## 2021-06-26 DIAGNOSIS — I10 Essential (primary) hypertension: Secondary | ICD-10-CM

## 2021-06-26 DIAGNOSIS — D696 Thrombocytopenia, unspecified: Secondary | ICD-10-CM

## 2021-06-26 DIAGNOSIS — R7401 Elevation of levels of liver transaminase levels: Secondary | ICD-10-CM

## 2021-06-29 ENCOUNTER — Other Ambulatory Visit (HOSPITAL_COMMUNITY): Payer: Self-pay

## 2021-07-06 ENCOUNTER — Telehealth: Payer: Self-pay | Admitting: Family Medicine

## 2021-07-06 DIAGNOSIS — E7800 Pure hypercholesterolemia, unspecified: Secondary | ICD-10-CM

## 2021-07-06 DIAGNOSIS — R7303 Prediabetes: Secondary | ICD-10-CM

## 2021-07-06 DIAGNOSIS — D696 Thrombocytopenia, unspecified: Secondary | ICD-10-CM

## 2021-07-06 DIAGNOSIS — E559 Vitamin D deficiency, unspecified: Secondary | ICD-10-CM

## 2021-07-06 DIAGNOSIS — I10 Essential (primary) hypertension: Secondary | ICD-10-CM

## 2021-07-06 NOTE — Telephone Encounter (Signed)
-----   Message from Ellamae Sia sent at 06/22/2021  7:59 AM EST ----- Regarding: Lab orders for Friday, 1.6.23 Patient is scheduled for CPX labs, please order future labs, Thanks , Karna Christmas

## 2021-07-09 ENCOUNTER — Encounter: Payer: Self-pay | Admitting: Family Medicine

## 2021-07-09 ENCOUNTER — Other Ambulatory Visit: Payer: Self-pay

## 2021-07-09 ENCOUNTER — Other Ambulatory Visit (INDEPENDENT_AMBULATORY_CARE_PROVIDER_SITE_OTHER): Payer: 59

## 2021-07-09 ENCOUNTER — Other Ambulatory Visit: Payer: 59

## 2021-07-09 DIAGNOSIS — R7303 Prediabetes: Secondary | ICD-10-CM | POA: Diagnosis not present

## 2021-07-09 DIAGNOSIS — I10 Essential (primary) hypertension: Secondary | ICD-10-CM | POA: Diagnosis not present

## 2021-07-09 DIAGNOSIS — E559 Vitamin D deficiency, unspecified: Secondary | ICD-10-CM | POA: Diagnosis not present

## 2021-07-09 DIAGNOSIS — D696 Thrombocytopenia, unspecified: Secondary | ICD-10-CM | POA: Diagnosis not present

## 2021-07-09 DIAGNOSIS — E7800 Pure hypercholesterolemia, unspecified: Secondary | ICD-10-CM

## 2021-07-09 LAB — CBC WITH DIFFERENTIAL/PLATELET
Basophils Absolute: 0 10*3/uL (ref 0.0–0.1)
Basophils Relative: 0.9 % (ref 0.0–3.0)
Eosinophils Absolute: 0.1 10*3/uL (ref 0.0–0.7)
Eosinophils Relative: 1.9 % (ref 0.0–5.0)
HCT: 39.2 % (ref 36.0–46.0)
Hemoglobin: 12.8 g/dL (ref 12.0–15.0)
Lymphocytes Relative: 42.1 % (ref 12.0–46.0)
Lymphs Abs: 1.6 10*3/uL (ref 0.7–4.0)
MCHC: 32.8 g/dL (ref 30.0–36.0)
MCV: 88.3 fl (ref 78.0–100.0)
Monocytes Absolute: 0.4 10*3/uL (ref 0.1–1.0)
Monocytes Relative: 10.5 % (ref 3.0–12.0)
Neutro Abs: 1.7 10*3/uL (ref 1.4–7.7)
Neutrophils Relative %: 44.6 % (ref 43.0–77.0)
Platelets: 74 10*3/uL — ABNORMAL LOW (ref 150.0–400.0)
RBC: 4.43 Mil/uL (ref 3.87–5.11)
RDW: 14 % (ref 11.5–15.5)
WBC: 3.9 10*3/uL — ABNORMAL LOW (ref 4.0–10.5)

## 2021-07-09 LAB — COMPREHENSIVE METABOLIC PANEL
ALT: 31 U/L (ref 0–35)
AST: 22 U/L (ref 0–37)
Albumin: 4.3 g/dL (ref 3.5–5.2)
Alkaline Phosphatase: 97 U/L (ref 39–117)
BUN: 17 mg/dL (ref 6–23)
CO2: 33 mEq/L — ABNORMAL HIGH (ref 19–32)
Calcium: 9.5 mg/dL (ref 8.4–10.5)
Chloride: 105 mEq/L (ref 96–112)
Creatinine, Ser: 0.56 mg/dL (ref 0.40–1.20)
GFR: 100.29 mL/min (ref >60.00)
Glucose, Bld: 97 mg/dL (ref 70–99)
Potassium: 4.1 mEq/L (ref 3.5–5.1)
Sodium: 142 mEq/L (ref 135–145)
Total Bilirubin: 0.6 mg/dL (ref 0.2–1.2)
Total Protein: 6.6 g/dL (ref 6.0–8.3)

## 2021-07-09 LAB — TSH: TSH: 0.91 u[IU]/mL (ref 0.35–5.50)

## 2021-07-09 LAB — LIPID PANEL
Cholesterol: 146 mg/dL (ref 0–200)
HDL: 61.7 mg/dL (ref >39.00)
LDL Cholesterol: 77 mg/dL (ref 0–99)
NonHDL: 84.43
Total CHOL/HDL Ratio: 2
Triglycerides: 35 mg/dL (ref 0.0–149.0)
VLDL: 7 mg/dL (ref 0.0–40.0)

## 2021-07-09 LAB — HEMOGLOBIN A1C: Hgb A1c MFr Bld: 5.8 % (ref 4.6–6.5)

## 2021-07-09 LAB — VITAMIN D 25 HYDROXY (VIT D DEFICIENCY, FRACTURES): VITD: 54.23 ng/mL (ref 30.00–100.00)

## 2021-07-13 ENCOUNTER — Other Ambulatory Visit (HOSPITAL_COMMUNITY): Payer: Self-pay

## 2021-07-14 ENCOUNTER — Other Ambulatory Visit (HOSPITAL_COMMUNITY): Payer: Self-pay

## 2021-07-16 ENCOUNTER — Ambulatory Visit (INDEPENDENT_AMBULATORY_CARE_PROVIDER_SITE_OTHER): Payer: 59 | Admitting: Family Medicine

## 2021-07-16 ENCOUNTER — Encounter: Payer: Self-pay | Admitting: Family Medicine

## 2021-07-16 ENCOUNTER — Other Ambulatory Visit (HOSPITAL_COMMUNITY): Payer: Self-pay

## 2021-07-16 ENCOUNTER — Other Ambulatory Visit: Payer: Self-pay

## 2021-07-16 VITALS — BP 128/80 | HR 78 | Temp 98.0°F | Ht 60.25 in | Wt 157.2 lb

## 2021-07-16 DIAGNOSIS — E7800 Pure hypercholesterolemia, unspecified: Secondary | ICD-10-CM

## 2021-07-16 DIAGNOSIS — E6609 Other obesity due to excess calories: Secondary | ICD-10-CM | POA: Diagnosis not present

## 2021-07-16 DIAGNOSIS — E559 Vitamin D deficiency, unspecified: Secondary | ICD-10-CM

## 2021-07-16 DIAGNOSIS — Z0000 Encounter for general adult medical examination without abnormal findings: Secondary | ICD-10-CM

## 2021-07-16 DIAGNOSIS — D696 Thrombocytopenia, unspecified: Secondary | ICD-10-CM | POA: Diagnosis not present

## 2021-07-16 DIAGNOSIS — I10 Essential (primary) hypertension: Secondary | ICD-10-CM | POA: Diagnosis not present

## 2021-07-16 DIAGNOSIS — Z6830 Body mass index (BMI) 30.0-30.9, adult: Secondary | ICD-10-CM

## 2021-07-16 DIAGNOSIS — R7303 Prediabetes: Secondary | ICD-10-CM | POA: Diagnosis not present

## 2021-07-16 DIAGNOSIS — E66811 Obesity, class 1: Secondary | ICD-10-CM

## 2021-07-16 LAB — CBC WITH DIFFERENTIAL/PLATELET
Absolute Monocytes: 732 cells/uL (ref 200–950)
Basophils Absolute: 31 cells/uL (ref 0–200)
Basophils Relative: 0.5 %
Eosinophils Absolute: 110 cells/uL (ref 15–500)
Eosinophils Relative: 1.8 %
HCT: 36.7 % (ref 35.0–45.0)
Hemoglobin: 12.1 g/dL (ref 11.7–15.5)
Lymphs Abs: 2403 cells/uL (ref 850–3900)
MCH: 29.2 pg (ref 27.0–33.0)
MCHC: 33 g/dL (ref 32.0–36.0)
MCV: 88.4 fL (ref 80.0–100.0)
MPV: 14.1 fL — ABNORMAL HIGH (ref 7.5–12.5)
Monocytes Relative: 12 %
Neutro Abs: 2824 cells/uL (ref 1500–7800)
Neutrophils Relative %: 46.3 %
Platelets: 80 10*3/uL — ABNORMAL LOW (ref 140–400)
RBC: 4.15 10*6/uL (ref 3.80–5.10)
RDW: 12.7 % (ref 11.0–15.0)
Total Lymphocyte: 39.4 %
WBC: 6.1 10*3/uL (ref 3.8–10.8)

## 2021-07-16 MED ORDER — ROSUVASTATIN CALCIUM 10 MG PO TABS
ORAL_TABLET | Freq: Every day | ORAL | 3 refills | Status: DC
  Filled 2021-07-16: qty 90, fill #0
  Filled 2021-09-12: qty 90, 90d supply, fill #0
  Filled 2021-12-13: qty 90, 90d supply, fill #1
  Filled 2022-03-20: qty 90, 90d supply, fill #2
  Filled 2022-05-24 – 2022-05-31 (×2): qty 90, 90d supply, fill #3

## 2021-07-16 MED ORDER — FLUTICASONE-SALMETEROL 250-50 MCG/ACT IN AEPB
INHALATION_SPRAY | RESPIRATORY_TRACT | 10 refills | Status: DC
  Filled 2021-07-16: qty 60, 30d supply, fill #0

## 2021-07-16 NOTE — Patient Instructions (Addendum)
If you are interested in the shingles vaccine series (Shingrix), call your insurance or pharmacy to check on coverage and location it must be given.  If affordable - you can schedule it here or at your pharmacy depending on coverage    Get the new bivalent covid vaccine at the pharmacy   Let's check cbc today  If platelet count is still under 100 we should plan a hematology follow up    Take care of yourself  Keep working on healthy diet and exercise

## 2021-07-16 NOTE — Progress Notes (Signed)
Subjective:    Patient ID: Katherine Tanner, female    DOB: 1961-04-06, 61 y.o.   MRN: [id number] This visit occurred during the SARS-CoV-2 public health emergency.  Safety protocols were in place, including screening questions prior to the visit, additional usage of staff PPE, and extensive cleaning of exam room while observing appropriate contact time as indicated for disinfecting solutions.    HPI Here for health maintenance exam and to review chronic medical problems    Wt Readings from Last 3 Encounters:  07/16/21 157 lb 4 oz (71.3 kg)  06/24/21 153 lb (69.4 kg)  05/25/21 156 lb 9.6 oz (71 kg)   30.46 kg/m Has kept weight off - doing well Taking care of herself   Joints hurt  She rides a stationary bike and light weights  Is good about that   Zoster status : not interested in shingrix yet  Covid vaccinated with booster Tdap 12/2011  She is allergic to flu vaccine   Mammogram 03/2021 Self breast exam - no lumps /just had that exam with gyn   Pap 05/2019 normal with neg HPV Will get one this year with gyn   Colonoscopy 10/2019   HTN bp is stable today  No cp or palpitations or headaches or edema  No side effects to medicines  BP Readings from Last 3 Encounters:  07/16/21 128/80  06/24/21 133/75  05/25/21 112/68     Losartan hct 100-25 one half pill daily  Lab Results  Component Value Date   CREATININE 0.56 07/09/2021   BUN 17 07/09/2021   NA 142 07/09/2021   K 4.1 07/09/2021   CL 105 07/09/2021   CO2 33 (H) 07/09/2021     H/o thrombocytopenia Lab Results  Component Value Date   WBC 3.9 (L) 07/09/2021   HGB 12.8 07/09/2021   HCT 39.2 07/09/2021   MCV 88.3 07/09/2021   PLT 74.0 Repeated and verified X2. (L) 07/09/2021   Has been up and down in the past  She saw hematology ? Immune vs reactive Dr Azzie Glatter has headaches  Stress -working for [employer]/hospital    No more bruising or bleeding than usual     Hyperlipidemia  Lab  Results  Component Value Date   CHOL 146 07/09/2021   CHOL 159 03/22/2021   CHOL 149 07/07/2020   Lab Results  Component Value Date   HDL 61.70 07/09/2021   HDL 68 03/22/2021   HDL 61.90 07/07/2020   Lab Results  Component Value Date   LDLCALC 77 07/09/2021   LDLCALC 81 03/22/2021   LDLCALC 77 07/07/2020   Lab Results  Component Value Date   TRIG 35.0 07/09/2021   TRIG 45 03/22/2021   TRIG 53.0 07/07/2020   Lab Results  Component Value Date   CHOLHDL 2 07/09/2021   CHOLHDL 2.3 03/22/2021   CHOLHDL 2 07/07/2020   Lab Results  Component Value Date   LDLDIRECT 147.9 12/17/2012   LDLDIRECT 138.3 12/09/2011   Well controlled  Crestor 10 mg daily   Prediabetes Lab Results  Component Value Date   HGBA1C 5.8 07/09/2021   This is stable   H/o elevated transaminases in the past Lab Results  Component Value Date   ALT 31 07/09/2021   AST 22 07/09/2021   ALKPHOS 97 07/09/2021   BILITOT 0.6 07/09/2021    Patient Active Problem List   Diagnosis Date Noted   Left ankle pain 01/12/2021   Overweight with body mass index (BMI) of 29  to 29.9 in adult 08/17/2020   At risk for diabetes mellitus 95/62/1308   Nonalcoholic hepatosteatosis 65/78/4696   At risk for heart disease 07/06/2020   Transaminitis 04/07/2020   Thrombocytopenia ([place]) 07/08/2019   Insulin resistance 06/19/2019   Vitamin D deficiency 11/05/2018   Obstructive sleep apnea 10/10/2017   Primary osteoarthritis of left knee 12/30/2016   Class 1 obesity due to excess calories with serious comorbidity and body mass index (BMI) of 30.0 to 30.9 in adult 05/24/2016   Prediabetes 02/24/2014   Osteoarthritis of right knee 11/15/2013   Osteoarthritis of left knee 11/15/2013   Alkaline phosphatase elevation 01/02/2012   Hyperlipidemia 01/02/2012   Routine general medical examination at a health care facility 12/09/2011   Allergy to influenza vaccine 05/02/2011   POLYARTHRITIS 01/28/2010   Chronic pain of both  knees 12/11/2009   ANGIOEDEMA 03/27/2007   Essential hypertension 03/19/2007   Perennial allergic rhinitis with seasonal variation 03/19/2007   Moderate intermittent asthma 03/19/2007   GERD 03/19/2007   HIATAL HERNIA 03/19/2007   GESTATIONAL DIABETES 03/19/2007   Past Medical History:  Diagnosis Date   Allergy    takes Claritin daily and Flonase daily   Arthritis    Asthma    uses Albuterol daily as needed;Singulair nightly;DUlera daily   Back pain    Constipation    Fatty liver    Food allergy    GERD (gastroesophageal reflux disease)    occasionally will take OTC meds but states she just watches what she eats   H/O hiatal hernia    History of bronchitis    last time 47yrs ago   History of colon polyps    History of kidney stones    History of shingles    Hyperlipidemia    Hypertension    takes Hyzaar daily   Joint pain    Joint pain    Joint swelling    Obesity    Osteoarthritis    Pneumonia    hx of;last time at least 28yrs ago   PONV (postoperative nausea and vomiting)    Prediabetes    Shortness of breath    Sleep apnea    Swelling of lower extremity    Trouble in sleeping    Wheezing    Past Surgical History:  Procedure Laterality Date   BREAST EXCISIONAL BIOPSY Right    CARDIAC CATHETERIZATION     COLONOSCOPY     ESOPHAGOGASTRODUODENOSCOPY     KNEE SURGERY Right    arthroscopy   Right ganglion cyst     x 2   right lumpectomy  around [place] Left 11/15/2013   Procedure: Marcaine/STEROID INJECTION;  Surgeon: Alta Corning, MD;  Location: [place];  Service: Orthopedics;  Laterality: Left;   TOTAL KNEE ARTHROPLASTY Right 11/15/2013   Procedure: RIGHT TOTAL KNEE ARTHROPLASTY;  Surgeon: Alta Corning, MD;  Location: [place];  Service: Orthopedics;  Laterality: Right;   TOTAL KNEE ARTHROPLASTY Left 12/30/2016   Procedure: TOTAL KNEE ARTHROPLASTY;  Surgeon: Dorna Leitz, MD;  Location: [place];  Service: Orthopedics;  Laterality: Left;   TUBAL  LIGATION     Social History   Tobacco Use   Smoking status: Never   Smokeless tobacco: Never  Vaping Use   Vaping Use: Never used  Substance Use Topics   Alcohol use: No    Alcohol/week: 0.0 standard drinks   Drug use: No   Family History  Problem Relation Age of Onset   Diabetes Father  Cancer Father    Liver disease Father    Alcoholism Father    Sarcoidosis Mother    Glaucoma Mother    Hypertension Mother    Hyperlipidemia Mother    Sleep apnea Mother    Obesity Mother    Breast cancer Neg Hx    Allergies  Allergen Reactions   Ace Inhibitors Shortness Of Breath and Cough    Cough/ breathing problems    Amoxicillin-Pot Clavulanate Swelling    SWELLING REACTION UNSPECIFIED    Influenza Virus Vacc Split Pf     UNSPECIFIED REACTION    Shellfish Allergy Itching   Current Outpatient Medications on File Prior to Visit  Medication Sig Dispense Refill   albuterol (VENTOLIN HFA) 108 (90 Base) MCG/ACT inhaler INHALE 2 PUFFS BY MOUTH INTO THE LUNGS EVERY 6 HOURS AS NEEDED FOR WHEEZING OR SHORTNESS OF BREATH. 18 g 12   baclofen (LIORESAL) 10 MG tablet Take 1 tablet by mouth twice a day as needed 60 tablet 3   baclofen (LIORESAL) 10 MG tablet Take 1 tablet by mouth 2 times daily as needed. 60 tablet 3   Calcium Citrate-Vitamin D (CALCIUM + D PO) Take 1 tablet by mouth daily.     Cholecalciferol (VITAMIN D3) 25 MCG (1000 UT) CAPS Take 1 capsule (1,000 Units total) by mouth daily. 30 capsule 0   diclofenac Sodium (VOLTAREN) 1 % GEL Apply 4 grams to skin four times a day 960 g 5   DULoxetine (CYMBALTA) 60 MG capsule Take 1 capsule by mouth at bedtime. 30 capsule 1   EPINEPHRINE 0.3 mg/0.3 mL IJ SOAJ injection INJECT 1 SYRINGE INTO THE MUSCLE ONCE 1 each 1   ipratropium-albuterol (DUONEB) 0.5-2.5 (3) MG/3ML SOLN USE 1 VIAL VIA NEBULIZER EVERY 6 HOURS AS NEEDED 90 mL 5   loratadine (CLARITIN) 10 MG tablet Take 10 mg by mouth daily.     losartan-hydrochlorothiazide (HYZAAR) 100-25  MG tablet TAKE 1 TABLET BY MOUTH ONCE DAILY (Patient taking differently: Take 0.5 tablets by mouth daily. 1/2 tab qd) 90 tablet 1   meloxicam (MOBIC) 15 MG tablet Take 1 tablet by mouth daily. 30 tablet 1   metFORMIN (GLUCOPHAGE) 500 MG tablet Take 1 tablet by mouth 2 times daily (with breakfast and with lunch.) 180 tablet 0   montelukast (SINGULAIR) 10 MG tablet TAKE 1 TABLET BY MOUTH AT BEDTIME 90 tablet 3   Multiple Vitamin (MULTIVITAMIN PO) Take 1 tablet by mouth daily.     Nebulizers (COMPRESSOR/NEBULIZER) MISC Use as directed 1 each 0   traMADol (ULTRAM) 50 MG tablet Take 1-2 tablets (50-100 mg total) by mouth 4 (four) times daily as needed for pain 240 tablet 1   traMADol (ULTRAM) 50 MG tablet Take 1 - 2 tablets by mouth 4 times a day as needed for pain 240 tablet 1   No current facility-administered medications on file prior to visit.    Review of Systems  Constitutional:  Negative for activity change, appetite change, fatigue, fever and unexpected weight change.  HENT:  Negative for congestion, ear pain, rhinorrhea, sinus pressure and sore throat.   Eyes:  Negative for pain, redness and visual disturbance.  Respiratory:  Negative for cough, shortness of breath and wheezing.   Cardiovascular:  Negative for chest pain and palpitations.  Gastrointestinal:  Negative for abdominal pain, blood in stool, constipation and diarrhea.  Endocrine: Negative for polydipsia and polyuria.  Genitourinary:  Negative for dysuria, frequency and urgency.  Musculoskeletal:  Negative for arthralgias, back pain  and myalgias.       Foot pain, plantar fasciitis   Skin:  Negative for pallor and rash.  Allergic/Immunologic: Negative for environmental allergies.  Neurological:  Negative for dizziness, syncope and headaches.  Hematological:  Negative for adenopathy. Does not bruise/bleed easily.  Psychiatric/Behavioral:  Negative for decreased concentration and dysphoric mood. The patient is not  nervous/anxious.       Objective:   Physical Exam Constitutional:      General: She is not in acute distress.    Appearance: Normal appearance. She is well-developed. She is obese. She is not ill-appearing or diaphoretic.  HENT:     Head: Normocephalic and atraumatic.     Right Ear: Tympanic membrane, ear canal and external ear normal.     Left Ear: Tympanic membrane, ear canal and external ear normal.     Nose: Nose normal. No congestion.     Mouth/Throat:     Mouth: Mucous membranes are moist.     Pharynx: Oropharynx is clear. No posterior oropharyngeal erythema.  Eyes:     General: No scleral icterus.    Extraocular Movements: Extraocular movements intact.     Conjunctiva/sclera: Conjunctivae normal.     Pupils: Pupils are equal, round, and reactive to light.  Neck:     Thyroid: No thyromegaly.     Vascular: No carotid bruit or JVD.  Cardiovascular:     Rate and Rhythm: Normal rate and regular rhythm.     Pulses: Normal pulses.     Heart sounds: Normal heart sounds.    No gallop.  Pulmonary:     Effort: Pulmonary effort is normal. No respiratory distress.     Breath sounds: Normal breath sounds. No wheezing.     Comments: Good air exch Chest:     Chest wall: No tenderness.  Abdominal:     General: Bowel sounds are normal. There is no distension or abdominal bruit.     Palpations: Abdomen is soft. There is no mass.     Tenderness: There is no abdominal tenderness.     Hernia: No hernia is present.  Genitourinary:    Comments: Breast and pelvic exams done by gyn provider Musculoskeletal:        General: No tenderness. Normal range of motion.     Cervical back: Normal range of motion and neck supple. No rigidity. No muscular tenderness.     Right lower leg: No edema.     Left lower leg: No edema.  Lymphadenopathy:     Cervical: No cervical adenopathy.  Skin:    General: Skin is warm and dry.     Coloration: Skin is not pale.     Findings: No erythema or rash.      Comments: Few skin tags  Neurological:     Mental Status: She is alert. Mental status is at baseline.     Cranial Nerves: No cranial nerve deficit.     Motor: No abnormal muscle tone.     Coordination: Coordination normal.     Gait: Gait normal.     Deep Tendon Reflexes: Reflexes are normal and symmetric. Reflexes normal.  Psychiatric:        Mood and Affect: Mood normal.        Cognition and Memory: Cognition and memory normal.          Assessment & Plan:   Problem List Items Addressed This Visit       Cardiovascular and Mediastinum   Essential hypertension  bp in fair control at this time  BP Readings from Last 1 Encounters:  07/16/21 128/80  No changes needed Most recent labs reviewed  Disc lifstyle change with low sodium diet and exercise  Plan to continue losartan hct 100-25 mg 1/2 pill daily      Relevant Medications   rosuvastatin (CRESTOR) 10 MG tablet     Hematopoietic and Hemostatic   Thrombocytopenia ([facility])    Platelets down at 74  ? Immune vs reactive change  No bruising or bleeding Sees hematology Dr Alen Blew   Re check today  If still under 100 will need to f/u with hematology Continue to watch for bruising and bleeding       Relevant Orders   CBC with Differential/Platelet (Completed)     Other   Routine general medical examination at a health care facility - Primary    Reviewed health habits including diet and exercise and skin cancer prevention Reviewed appropriate screening tests for age  Also reviewed health mt list, fam hx and immunization status , as well as social and family history   See HPI Labs reviewed  May consider shingrix later, not now  covid vaccinated  Allergic to flu vaccine Mammogram utd  Pap utd-sees gyn  Colonoscopy utd  Has mt weight loss/commended        Hyperlipidemia    Disc goals for lipids and reasons to control them Rev last labs with pt Rev low sat fat diet in detail Well controlled with crestor 10 mg  daily and diet       Relevant Medications   rosuvastatin (CRESTOR) 10 MG tablet   Prediabetes    Lab Results  Component Value Date   HGBA1C 5.8 07/09/2021  disc imp of low glycemic diet and wt loss to prevent DM2  She has maintained weight loss Pt continues [facility]      Class 1 obesity due to excess calories with serious comorbidity and body mass index (BMI) of 30.0 to 30.9 in adult    Discussed how this problem influences overall health and the risks it imposes  Reviewed plan for weight loss with lower calorie diet (via better food choices and also portion control or program like weight watchers) and exercise building up to or more than 30 minutes 5 days per week including some aerobic activity   [facility]       Vitamin D deficiency    D level of 54.2 Discussed importance to bone and overall health  Will continue current supplementation

## 2021-07-18 MED FILL — Montelukast Sodium Tab 10 MG (Base Equiv): ORAL | 90 days supply | Qty: 90 | Fill #3 | Status: AC

## 2021-07-18 NOTE — Assessment & Plan Note (Signed)
Discussed how this problem influences overall health and the risks it imposes  Reviewed plan for weight loss with lower calorie diet (via better food choices and also portion control or program like weight watchers) and exercise building up to or more than 30 minutes 5 days per week including some aerobic activity   [facility]

## 2021-07-18 NOTE — Assessment & Plan Note (Signed)
bp in fair control at this time  BP Readings from Last 1 Encounters:  07/16/21 128/80   No changes needed Most recent labs reviewed  Disc lifstyle change with low sodium diet and exercise  Plan to continue losartan hct 100-25 mg 1/2 pill daily

## 2021-07-18 NOTE — Assessment & Plan Note (Signed)
Platelets down at 74  ? Immune vs reactive change  No bruising or bleeding Sees hematology Dr Alen Blew   Re check today  If still under 100 will need to f/u with hematology Continue to watch for bruising and bleeding

## 2021-07-18 NOTE — Assessment & Plan Note (Signed)
Reviewed health habits including diet and exercise and skin cancer prevention Reviewed appropriate screening tests for age  Also reviewed health mt list, fam hx and immunization status , as well as social and family history   See HPI Labs reviewed  May consider shingrix later, not now  covid vaccinated  Allergic to flu vaccine Mammogram utd  Pap utd-sees gyn  Colonoscopy utd  Has mt weight loss/commended

## 2021-07-18 NOTE — Assessment & Plan Note (Signed)
D level of 54.2 Discussed importance to bone and overall health  Will continue current supplementation

## 2021-07-18 NOTE — Assessment & Plan Note (Addendum)
Lab Results  Component Value Date   HGBA1C 5.8 07/09/2021   disc imp of low glycemic diet and wt loss to prevent DM2  She has maintained weight loss Pt continues metformin [facility]

## 2021-07-18 NOTE — Assessment & Plan Note (Signed)
Disc goals for lipids and reasons to control them Rev last labs with pt Rev low sat fat diet in detail Well controlled with crestor 10 mg daily and diet

## 2021-07-19 ENCOUNTER — Telehealth: Payer: Self-pay

## 2021-07-19 ENCOUNTER — Other Ambulatory Visit (HOSPITAL_COMMUNITY): Payer: Self-pay

## 2021-07-19 DIAGNOSIS — D696 Thrombocytopenia, unspecified: Secondary | ICD-10-CM

## 2021-07-19 NOTE — Telephone Encounter (Signed)
Sent from my chart message from patient.

## 2021-07-19 NOTE — Addendum Note (Signed)
Addended by: Loura Pardon A on: 07/19/2021 09:12 PM   Modules accepted: Orders

## 2021-07-19 NOTE — Telephone Encounter (Signed)
I placed a referral if needed ,thanks

## 2021-07-19 NOTE — Telephone Encounter (Signed)
Added to phone note and sent to Dr. Glori Bickers. No further action needed at this time. Will send my chart to patent once information received by Dr. Glori Bickers.

## 2021-07-20 NOTE — Telephone Encounter (Signed)
Mychart message sent to pt notifying her of referral placed.

## 2021-07-21 ENCOUNTER — Other Ambulatory Visit: Payer: Self-pay

## 2021-07-21 ENCOUNTER — Encounter (INDEPENDENT_AMBULATORY_CARE_PROVIDER_SITE_OTHER): Payer: Self-pay | Admitting: Adult Health

## 2021-07-21 ENCOUNTER — Ambulatory Visit (INDEPENDENT_AMBULATORY_CARE_PROVIDER_SITE_OTHER): Payer: 59 | Admitting: Adult Health

## 2021-07-21 VITALS — BP 113/60 | HR 68 | Temp 98.7°F | Ht 61.0 in | Wt 154.0 lb

## 2021-07-21 DIAGNOSIS — R7303 Prediabetes: Secondary | ICD-10-CM

## 2021-07-21 DIAGNOSIS — Z6829 Body mass index (BMI) 29.0-29.9, adult: Secondary | ICD-10-CM | POA: Diagnosis not present

## 2021-07-21 DIAGNOSIS — E669 Obesity, unspecified: Secondary | ICD-10-CM

## 2021-07-21 DIAGNOSIS — D696 Thrombocytopenia, unspecified: Secondary | ICD-10-CM

## 2021-07-22 NOTE — Progress Notes (Signed)
Chief Complaint:   OBESITY Katherine Tanner is here to discuss her progress with her obesity treatment plan along with follow-up of her obesity related diagnoses. Katherine Tanner is on the Category 2 Plan and states she is following her eating plan approximately 100% of the time. Katherine Tanner states she is lifting weights/biking for 30 minutes 4 times per week.  Today's visit was #: 20 Starting weight: 198 lbs Starting date: 08/30/2018 Today's weight: 154 lbs Today's date: 07/21/2021 Total lbs lost to date: 44 lbs Total lbs lost since last in-office visit: 0  Interim History:  2023 Health/Wellness Goals: 1) Increase muscle tone.  MM 87 pounds, FM 61 lbs.  MM > FM.  87 lbs > 61 lbs.  Visceral adipose rating 10 - at goal.  Subjective:   1. Thrombocytopenia ([place]) On 07/09/2021, CBC - platelets 74.  CBC rechecked on 07/16/2021 - 80. PCP/Dr. Glori Bickers referred her to Hematology/Oncology. She denies any unusual bleeding/bruising, reports increased fatigue.  2. Prediabetes On 07/09/2021, A1c 5.8, CMP - GRF 100. She is on metformin 500 mg BID with meals - tolerating well.  Assessment/Plan:   1. Thrombocytopenia ([place]) Follow-up with Hematology/Oncology.  2. Prediabetes Continue metformin as directed.  No need for refill.  3. Obesity with current BMI 29.1  [name] is currently in the action stage of change. As such, her goal is to continue with weight loss efforts. She has agreed to the Category 2 Plan.   Use elliptical for 10 minutes 2 times per week.  Exercise goals:  As is.  Behavioral modification strategies: increasing lean protein intake, decreasing simple carbohydrates, meal planning and cooking strategies, keeping healthy foods in the home, and planning for success.  [name] has agreed to follow-up with our clinic in 4 weeks. She was informed of the importance of frequent follow-up visits to maximize her success with intensive lifestyle modifications for her multiple health conditions.    Objective:   Blood pressure 113/60, pulse 68, temperature 98.7 F (37.1 C), height 5\' 1"  (1.549 m), weight 154 lb (69.9 kg), last menstrual period 02/20/2014, SpO2 97 %. Body mass index is 29.1 kg/m.  General: Cooperative, alert, well developed, in no acute distress. HEENT: Conjunctivae and lids unremarkable. Cardiovascular: Regular rhythm.  Lungs: Normal work of breathing. Neurologic: No focal deficits.   Lab Results  Component Value Date   CREATININE 0.56 07/09/2021   BUN 17 07/09/2021   NA 142 07/09/2021   K 4.1 07/09/2021   CL 105 07/09/2021   CO2 33 (H) 07/09/2021   Lab Results  Component Value Date   ALT 31 07/09/2021   AST 22 07/09/2021   ALKPHOS 97 07/09/2021   BILITOT 0.6 07/09/2021   Lab Results  Component Value Date   HGBA1C 5.8 07/09/2021   HGBA1C 5.6 06/24/2021   HGBA1C 5.8 (H) 03/22/2021   HGBA1C 5.6 10/12/2020   HGBA1C 5.8 07/07/2020   Lab Results  Component Value Date   INSULIN 7.0 06/24/2021   INSULIN 6.9 03/22/2021   INSULIN 6.0 10/12/2020   INSULIN 11.2 03/23/2020   INSULIN 6.2 12/24/2019   Lab Results  Component Value Date   TSH 0.91 07/09/2021   Lab Results  Component Value Date   CHOL 146 07/09/2021   HDL 61.70 07/09/2021   LDLCALC 77 07/09/2021   LDLDIRECT 147.9 12/17/2012   TRIG 35.0 07/09/2021   CHOLHDL 2 07/09/2021   Lab Results  Component Value Date   VD25OH 54.23 07/09/2021   VD25OH 79.7 06/24/2021   VD25OH 69.3 03/22/2021  Lab Results  Component Value Date   WBC 6.1 07/16/2021   HGB 12.1 07/16/2021   HCT 36.7 07/16/2021   MCV 88.4 07/16/2021   PLT 80 (L) 07/16/2021   Lab Results  Component Value Date   IRON 63 07/07/2020   Attestation Statements:   Reviewed by clinician on day of visit: allergies, medications, problem list, medical history, surgical history, family history, social history, and previous encounter notes.  Time spent on visit including pre-visit chart review and post-visit care and charting  was 27 minutes.   I, Water quality scientist, CMA, am acting as Location manager for Mina Marble, NP.  I have reviewed the above documentation for accuracy and completeness, and I agree with the above. -  Etola Mull d. Salina Stanfield, NP-C

## 2021-07-23 NOTE — Progress Notes (Signed)
Patient ID: Katherine Tanner, female    DOB: [birth year], 61 y.o.   MRN: [id number]  HPI female never smoker followed for Allergic rhinitis, Asthma,OSA, complicated by GERD Office spirometry: 11/24/2014: WNL FVC 2.39/97%, FEV1 2.09/103%, FEV1/FVC 0.88, FEF 25-75 percent 3.27/122% Office Spirometry 07/06/17-WNL-FVC 2.25/91%, FEV1 2.04/104%, ratio 0.91, FEF 25-75% 3.62/173% FENO 10/09/17   11   Not elevated Methacholine inhalation challenge test 01/25/2018-interpreted as positive because of initiation of hard coughing although FEV1 only declined by 15%. 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  Has worked hard to reduce weight under physician supervision. Since onset of fall weather she has noted more dyspnea on exertion with little cough or wheeze.  Occasionally feels smothered lying supine.  Rescue inhaler does help some, used 2-3 times per week.  Has to stop to rest as she walks from her car in to work in the morning.  Describes a pressure sensation over her chest and back is nonexertional.  Unclear how much of this may be musculoskeletal.  I do not think she is describing cardiac pain.  07/26/21- 61 year old female never smoker followed for Allergic rhinitis, Asthma,  Hx OSA(minimal- no CPAP/ lost weight), complicated by GERD, HTN, Thrombocytopenia, PreDiabetes,  -Neb Duoneb, Singulair, Advair 250, Flonase,  albuterol hfa,  Epipen for Shellfish allergy  Covid vax-3 Phizer Flu vax-declined "allergic" Body weight today -156 lbs ACT score 21- She notes some shortness of breath mainly with weather changes and around strong odors.  Generally feels well controlled with current meds.  Breztri made her feel "funny" so she went back to Advair. PFT 07/26/21- Normal flows and volumes, DLCO relatively high for volume CXR 05/26/21-  IMPRESSION: No active cardiopulmonary disease.  PFT 07/26/21-   Review of Systems-Per HPI.   + = positive Constitutional:   +diet>   weight loss, night sweats, fevers, chills, fatigue, lassitude. HEENT:   +  headaches, difficulty swallowing, tooth/dental problems, sore throat,       No-sneezing, itching, no-ear ache, + nasal congestion, no- post nasal drip,  CV:   chest pain, orthopnea, PND, swelling in lower extremities, anasarca, dizziness, palpitations Resp: +shortness of breath with exertion or at rest.             productive cough,  No- non-productive cough,  No- coughing up of blood.              No-   change in color of mucus. No- wheezing.   Skin:  GI:  No-   heartburn, indigestion, abdominal pain, nausea, vomiting,  GU: . MS:  No-   joint pain or swelling.  . Neuro-     nothing unusual Psych:  No- change in mood or affect. No depression or anxiety.  No memory loss.  Objective:   Physical Exam General- Alert, Oriented, Affect-appropriate, Distress- none acute;  Skin- rash-none, lesions- none, excoriation- none Lymphadenopathy- none Head- atraumatic            Eyes- Gross vision intact, PERRLA, conjunctivae clear, no discolored secretions  Ears- Hearing, canals-normal            Nose-    no-Septal dev, mucus, polyps, erosion, perforation             Throat- Mallampati III , mucosa clear , drainage- none, tonsils- atrophic Neck- flexible , trachea midline, no stridor , thyroid nl, carotid no bruit Chest - symmetrical excursion , unlabored            Heart/CV- RRR , no murmur , no gallop  , no rub, nl s1 s2                           - JVD- none , edema- none, stasis changes- none, varices- none           Lung- unlabored, wheeze- none, cough- none , dullness-none, rub- none,            Chest wall-  Abd-  Br/ Gen/ Rectal- Not done, not indicated Extrem- cyanosis- none, clubbing, none, atrophy- none, strength- nl Neuro- grossly intact to observation

## 2021-07-26 ENCOUNTER — Other Ambulatory Visit: Payer: Self-pay

## 2021-07-26 ENCOUNTER — Ambulatory Visit: Payer: 59 | Admitting: Internal Medicine

## 2021-07-26 ENCOUNTER — Ambulatory Visit (INDEPENDENT_AMBULATORY_CARE_PROVIDER_SITE_OTHER): Payer: 59 | Admitting: Internal Medicine

## 2021-07-26 ENCOUNTER — Encounter: Payer: Self-pay | Admitting: Internal Medicine

## 2021-07-26 DIAGNOSIS — G4733 Obstructive sleep apnea (adult) (pediatric): Secondary | ICD-10-CM

## 2021-07-26 DIAGNOSIS — J4540 Moderate persistent asthma, uncomplicated: Secondary | ICD-10-CM

## 2021-07-26 DIAGNOSIS — R0609 Other forms of dyspnea: Secondary | ICD-10-CM

## 2021-07-26 LAB — PULMONARY FUNCTION TEST
DL/VA % pred: 141 %
DL/VA: 6.15 ml/min/mmHg/L
DLCO cor % pred: 117 %
DLCO cor: 21.67 ml/min/mmHg
DLCO unc % pred: 112 %
DLCO unc: 20.76 ml/min/mmHg
FEF 25-75 Post: 3.91 L/sec
FEF 25-75 Pre: 3.09 L/sec
FEF2575-%Change-Post: 26 %
FEF2575-%Pred-Post: 201 %
FEF2575-%Pred-Pre: 159 %
FEV1-%Change-Post: 1 %
FEV1-%Pred-Post: 115 %
FEV1-%Pred-Pre: 114 %
FEV1-Post: 2.17 L
FEV1-Pre: 2.14 L
FEV1FVC-%Change-Post: 1 %
FEV1FVC-%Pred-Pre: 114 %
FEV6-%Change-Post: -1 %
FEV6-%Pred-Post: 100 %
FEV6-%Pred-Pre: 101 %
FEV6-Post: 2.3 L
FEV6-Pre: 2.34 L
FEV6FVC-%Change-Post: 0 %
FEV6FVC-%Pred-Post: 103 %
FEV6FVC-%Pred-Pre: 103 %
FVC-%Change-Post: 0 %
FVC-%Pred-Post: 97 %
FVC-%Pred-Pre: 98 %
FVC-Post: 2.33 L
FVC-Pre: 2.34 L
Post FEV1/FVC ratio: 93 %
Post FEV6/FVC ratio: 99 %
Pre FEV1/FVC ratio: 92 %
Pre FEV6/FVC Ratio: 100 %
RV % pred: 101 %
RV: 1.83 L
TLC % pred: 93 %
TLC: 4.32 L

## 2021-07-26 NOTE — Patient Instructions (Addendum)
We can continue current meds  Please call if we can help 

## 2021-07-26 NOTE — Progress Notes (Signed)
Full PFT completed today ? ?

## 2021-07-28 ENCOUNTER — Other Ambulatory Visit: Payer: Self-pay

## 2021-07-28 ENCOUNTER — Inpatient Hospital Stay: Payer: 59 | Attending: Internal Medicine | Admitting: Internal Medicine

## 2021-07-28 ENCOUNTER — Encounter: Payer: Self-pay | Admitting: Internal Medicine

## 2021-07-28 ENCOUNTER — Inpatient Hospital Stay: Payer: 59

## 2021-07-28 VITALS — BP 127/82 | HR 85 | Temp 98.0°F | Ht 61.0 in | Wt 155.4 lb

## 2021-07-28 DIAGNOSIS — D696 Thrombocytopenia, unspecified: Secondary | ICD-10-CM | POA: Insufficient documentation

## 2021-07-28 DIAGNOSIS — I10 Essential (primary) hypertension: Secondary | ICD-10-CM | POA: Insufficient documentation

## 2021-07-28 DIAGNOSIS — Z7951 Long term (current) use of inhaled steroids: Secondary | ICD-10-CM | POA: Diagnosis not present

## 2021-07-28 DIAGNOSIS — Z79899 Other long term (current) drug therapy: Secondary | ICD-10-CM | POA: Insufficient documentation

## 2021-07-28 DIAGNOSIS — Z7984 Long term (current) use of oral hypoglycemic drugs: Secondary | ICD-10-CM | POA: Diagnosis not present

## 2021-07-28 LAB — CBC WITH DIFFERENTIAL/PLATELET
Abs Immature Granulocytes: 0 10*3/uL (ref 0.00–0.07)
Basophils Absolute: 0 10*3/uL (ref 0.0–0.1)
Basophils Relative: 1 %
Eosinophils Absolute: 0.1 10*3/uL (ref 0.0–0.5)
Eosinophils Relative: 1 %
HCT: 38.7 % (ref 36.0–46.0)
Hemoglobin: 12.7 g/dL (ref 12.0–15.0)
Immature Granulocytes: 0 %
Lymphocytes Relative: 35 %
Lymphs Abs: 2.1 10*3/uL (ref 0.7–4.0)
MCH: 29.1 pg (ref 26.0–34.0)
MCHC: 32.8 g/dL (ref 30.0–36.0)
MCV: 88.6 fL (ref 80.0–100.0)
Monocytes Absolute: 0.6 10*3/uL (ref 0.1–1.0)
Monocytes Relative: 11 %
Neutro Abs: 3.1 10*3/uL (ref 1.7–7.7)
Neutrophils Relative %: 52 %
Platelets: 82 10*3/uL — ABNORMAL LOW (ref 150–400)
RBC: 4.37 MIL/uL (ref 3.87–5.11)
RDW: 13.7 % (ref 11.5–15.5)
WBC: 5.9 10*3/uL (ref 4.0–10.5)
nRBC: 0 % (ref 0.0–0.2)

## 2021-07-28 LAB — IMMATURE PLATELET FRACTION: Immature Platelet Fraction: 13.6 % — ABNORMAL HIGH (ref 1.2–8.6)

## 2021-07-28 LAB — TECHNOLOGIST SMEAR REVIEW: Plt Morphology: DECREASED

## 2021-07-28 LAB — LACTATE DEHYDROGENASE: LDH: 184 U/L (ref 98–192)

## 2021-07-28 NOTE — Progress Notes (Signed)
Left a voicemail/unable to reach patient. Regarding labs from today. 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 Continue surveillance on a monthly basis. Labs.

## 2021-07-28 NOTE — Assessment & Plan Note (Addendum)
#  Thrombocytopenia: Mild/moderate [70-80s since Jan 2023].  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  I do long discussion with patient regarding multiple etiologies including but not limited to liver disease/medications/autoimmune disease-ITP etc.  Less likely any malignant causes.  Discussed that ITP is a diagnosis of exclusion. Discussed the role of a bone marrow biopsy for further evaluation of hematologic disorder-given the less likely chance of malignancy/I think is reasonable to hold off any bone marrow biopsy at this time.   #In general the recommendations to initiate ITP treatment is platelets are less than 50 or lower.  Discussed with patient that ITP is usually treated with steroids.  Hold off any steroids at this time as patient is fairly asymptomatic.  Monitor closely for easy bruising/nosebleeds while on Mobic.  Recommend further work-up including-CBC  LDH; review of peripheral smear;   Ultrasound abdomen-to rule out cirrhosis/splenomegaly.  Thank you Dr.Towers for allowing me to participate in the care of your pleasant patient. Please do not hesitate to contact me with questions or concerns in the interim.  Patient prefers to follow-up at [facility].  # DISPOSITION: # labs today- [facility]; review of peripheral smear; platelet fraction # US abdomen in next 1-2 weeks # labs- cbc q monthly.  # follow up in 4 months- MD; labs- cbc/bmp-- Dr.B

## 2021-07-28 NOTE — Progress Notes (Signed)
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  Alone.  Katherine Tanner 61 y.o.  female referred to [name] for further evaluation of thrombocytopenia.  Patient admits to few nosebleeds in the last few weeks.  Otherwise denies any spontaneous bruising or other mucosal bleeding. patient has been on Mobic for 4 months. Easy bruising/bleeding:Yes with minimal trauma.   Prior known history of thrombocytopenia: yes.  Patient was evaluated at [facility] long; hematology-however patient's platelets had improved to 155.  And hence patient was recommended as needed follow-up.  Alcohol: none  Antiplatelet therapy/blood thinners: none;  Hepatitis/HIV: none as per Hx  Liver disease/CHF: none  Ultrasound/CT scan: 2019 CT chest-hepatic steatosis.    Review of Systems  Constitutional:  Positive for malaise/fatigue. Negative for chills, diaphoresis, fever and weight loss.  HENT:  Negative for nosebleeds and sore throat.   Eyes:  Negative for double vision.  Respiratory:  Negative for cough, hemoptysis, sputum production, shortness of breath and wheezing.   Cardiovascular:  Negative for chest pain, palpitations, orthopnea and leg swelling.  Gastrointestinal:  Negative for abdominal pain, blood in stool, constipation, diarrhea, heartburn, melena, nausea and vomiting.  Genitourinary:  Negative for dysuria, frequency and urgency.  Musculoskeletal:  Positive for back pain and joint pain.  Skin: Negative.  Negative for itching and rash.  Neurological:  Negative for dizziness, tingling, focal weakness, weakness and  headaches.  Endo/Heme/Allergies:  Does not bruise/bleed easily.  Psychiatric/Behavioral:  Negative for depression. The patient is not nervous/anxious and does not have insomnia.     MEDICAL HISTORY:  Past Medical History:  Diagnosis Date   Allergy    takes Claritin daily and Flonase daily   Arthritis    Asthma    uses Albuterol daily as needed;Singulair nightly;DUlera daily   Back pain    Constipation    Fatty liver    Food allergy    GERD (gastroesophageal reflux disease)    occasionally will take OTC meds but states she just watches what she eats   H/O hiatal hernia    History of bronchitis    last time 58yr ago   History of colon polyps    History of kidney stones    History of shingles    Hyperlipidemia    Hypertension    takes Hyzaar daily   Joint pain    Joint pain    Joint swelling    Obesity    Osteoarthritis    Pneumonia    hx of;last time at least 89yrago   PONV (postoperative nausea and vomiting)    Prediabetes    Shortness of breath    Sleep apnea    Swelling of lower extremity    Trouble in sleeping    Wheezing     SURGICAL HISTORY: Past Surgical History:  Procedure Laterality Date   BREAST EXCISIONAL BIOPSY Right    CARDIAC CATHETERIZATION     COLONOSCOPY     ESOPHAGOGASTRODUODENOSCOPY     KNEE SURGERY Right    arthroscopy   Right ganglion cyst     x 2   right lumpectomy  around [place] 11/15/2013   Procedure: Marcaine/STEROID INJECTION;  Surgeon: Alta Corning, MD;  Location: [place];  Service: Orthopedics;  Laterality: Left;   TOTAL KNEE ARTHROPLASTY Right 11/15/2013   Procedure: RIGHT TOTAL KNEE ARTHROPLASTY;  Surgeon: Alta Corning, MD;  Location: [facility];  Service: Orthopedics;  Laterality: Right;   TOTAL KNEE ARTHROPLASTY Left 12/30/2016   Procedure: TOTAL KNEE ARTHROPLASTY;  Surgeon: Dorna Leitz, MD;  Location: [place];  Service: Orthopedics;  Laterality: Left;   TUBAL LIGATION      SOCIAL HISTORY: Social History    Socioeconomic History   Marital status: Married    Spouse name: Legrand Como   Number of children: 1   Years of education: Not on file   Highest education level: Not on file  Occupational History   Occupation: Tax adviser: [employer]    Employer: [employer]  Tobacco Use   Smoking status: Never   Smokeless tobacco: Never  Vaping Use   Vaping Use: Never used  Substance and Sexual Activity   Alcohol use: No    Alcohol/week: 0.0 standard drinks   Drug use: No   Sexual activity: Yes  Other Topics Concern   Not on file  Social History Narrative   Lives in [place]; works at [employer].  No alcohol.   Social Determinants of Health   Financial Resource Strain: Not on file  Food Insecurity: Not on file  Transportation Needs: Not on file  Physical Activity: Not on file  Stress: Not on file  Social Connections: Not on file  Intimate Partner Violence: Not on file    FAMILY HISTORY: Family History  Problem Relation Age of Onset   Diabetes Father    Cancer Father    Liver disease Father    Alcoholism Father    Sarcoidosis Mother    Glaucoma Mother    Hypertension Mother    Hyperlipidemia Mother    Sleep apnea Mother    Obesity Mother    Breast cancer Neg Hx     ALLERGIES:  is allergic to ace inhibitors, amoxicillin-pot clavulanate, influenza virus vacc split pf, and shellfish allergy.  MEDICATIONS:  Current Outpatient Medications  Medication Sig Dispense Refill   albuterol (VENTOLIN HFA) 108 (90 Base) MCG/ACT inhaler INHALE 2 PUFFS BY MOUTH INTO THE LUNGS EVERY 6 HOURS AS NEEDED FOR WHEEZING OR SHORTNESS OF BREATH. 18 g 12   baclofen (LIORESAL) 10 MG tablet Take 1 tablet by mouth twice a day as needed 60 tablet 3   baclofen (LIORESAL) 10 MG tablet Take 1 tablet by mouth 2 times daily as needed. 60 tablet 3   Calcium Citrate-Vitamin D (CALCIUM + D PO) Take 1 tablet by mouth daily.     Cholecalciferol (VITAMIN D3) 25 MCG (1000 UT) CAPS Take 1 capsule  (1,000 Units total) by mouth daily. 30 capsule 0   diclofenac Sodium (VOLTAREN) 1 % GEL Apply 4 grams to skin four times a day 960 g 5   DULoxetine (CYMBALTA) 60 MG capsule Take 1 capsule by mouth at bedtime. 30 capsule 1   fluticasone-salmeterol (ADVAIR DISKUS) 250-50 MCG/ACT AEPB Inhale 1 puff into the lungs 2 times daily then rinse mouth 60 each 10   ipratropium-albuterol (DUONEB) 0.5-2.5 (3) MG/3ML SOLN USE 1 VIAL VIA NEBULIZER EVERY 6 HOURS AS NEEDED 90 mL 5   loratadine (CLARITIN) 10 MG tablet Take 10 mg by mouth daily.     losartan-hydrochlorothiazide (HYZAAR) 100-25 MG tablet TAKE 1 TABLET BY MOUTH ONCE DAILY (Patient taking differently: Take 0.5 tablets  by mouth daily. 1/2 tab qd) 90 tablet 1   meloxicam (MOBIC) 15 MG tablet Take 1 tablet by mouth daily. 30 tablet 1   metFORMIN (GLUCOPHAGE) 500 MG tablet Take 1 tablet by mouth 2 times daily (with breakfast and with lunch.) 180 tablet 0   montelukast (SINGULAIR) 10 MG tablet TAKE 1 TABLET BY MOUTH AT BEDTIME 90 tablet 3   Multiple Vitamin (MULTIVITAMIN PO) Take 1 tablet by mouth daily.     Nebulizers (COMPRESSOR/NEBULIZER) MISC Use as directed 1 each 0   rosuvastatin (CRESTOR) 10 MG tablet TAKE 1 TABLET BY MOUTH DAILY 90 tablet 3   traMADol (ULTRAM) 50 MG tablet Take 1-2 tablets (50-100 mg total) by mouth 4 (four) times daily as needed for pain 240 tablet 1   traMADol (ULTRAM) 50 MG tablet Take 1 - 2 tablets by mouth 4 times a day as needed for pain 240 tablet 1   EPINEPHRINE 0.3 mg/0.3 mL IJ SOAJ injection INJECT 1 SYRINGE INTO THE MUSCLE ONCE (Patient not taking: Reported on 07/28/2021) 1 each 1   No current facility-administered medications for this visit.    PHYSICAL EXAMINATION:  Vitals:   07/28/21 1350  BP: 127/82  Pulse: 85  Temp: 98 F (36.7 C)  SpO2: 100%   Filed Weights   07/28/21 1350  Weight: 155 lb 6.4 oz (70.5 kg)    Physical Exam Vitals and nursing note reviewed.  HENT:     Head: Normocephalic and  atraumatic.     Mouth/Throat:     Pharynx: Oropharynx is clear.  Eyes:     Extraocular Movements: Extraocular movements intact.     Pupils: Pupils are equal, round, and reactive to light.  Cardiovascular:     Rate and Rhythm: Normal rate and regular rhythm.  Pulmonary:     Comments: Decreased breath sounds bilaterally.  Abdominal:     Palpations: Abdomen is soft.  Musculoskeletal:        General: Normal range of motion.     Cervical back: Normal range of motion.  Skin:    General: Skin is warm.  Neurological:     General: No focal deficit present.     Mental Status: She is alert and oriented to person, place, and time.  Psychiatric:        Behavior: Behavior normal.        Judgment: Judgment normal.     LABORATORY DATA:  I have reviewed the data as listed Lab Results  Component Value Date   WBC 6.1 07/16/2021   HGB 12.1 07/16/2021   HCT 36.7 07/16/2021   MCV 88.4 07/16/2021   PLT 80 (L) 07/16/2021   Recent Labs    03/22/21 1237 06/24/21 0751 07/09/21 0735  NA 140 145* 142  K 4.3 4.7 4.1  CL 99 103 105  CO2 25 29 33*  GLUCOSE 88 97 97  BUN _0 CREATININE 0.63 0.60 0.56  CALCIUM 9.9 9.8 9.5  PROT 6.9 6.8 6.6  ALBUMIN 4.3 4.7 4.3  AST _1 ALT _2 ALKPHOS 126* 105 97  BILITOT 0.3 0.4 0.6    RADIOGRAPHIC STUDIES: I have personally reviewed the radiological images as listed and agreed with the findings in the report. No results found.  Thrombocytopenia ([place]) # Thrombocytopenia: Mild/moderate [70-80s since Jan 2023].  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  I do long discussion with patient regarding multiple etiologies including but not limited to liver disease/medications/autoimmune disease-ITP etc.  Less likely any malignant causes.  Discussed that ITP is a diagnosis of exclusion. Discussed the role of a bone marrow biopsy for further evaluation of hematologic disorder-given the less likely chance of malignancy/I think is  reasonable to hold off any bone marrow biopsy at this time.   #In general the recommendations to initiate ITP treatment is platelets are less than 50 or lower.  Discussed with patient that ITP is usually treated with steroids.  Hold off any steroids at this time as patient is fairly asymptomatic.  Monitor closely for easy bruising/nosebleeds while on Mobic.  Recommend further work-up including-CBC  LDH; review of peripheral smear;   Ultrasound abdomen-to rule out cirrhosis/splenomegaly.  Thank you Dr.Towers for allowing me to participate in the care of your pleasant patient. Please do not hesitate to contact me with questions or concerns in the interim.  Patient prefers to follow-up at [facility]  # DISPOSITION: # labs today- [facility]; review of peripheral smear; platelet fraction # US abdomen in next 1-2 weeks # labs- cbc q monthly.  # follow up in 4 months- MD; labs- cbc/bmp-- Dr.B    All questions were answered. The patient knows to call the clinic with any problems, questions or concerns.       Cammie Sickle, MD 07/28/2021 2:32 PM

## 2021-08-06 ENCOUNTER — Other Ambulatory Visit: Payer: Self-pay | Admitting: Family Medicine

## 2021-08-06 ENCOUNTER — Other Ambulatory Visit (HOSPITAL_COMMUNITY): Payer: Self-pay

## 2021-08-06 MED ORDER — LOSARTAN POTASSIUM-HCTZ 100-25 MG PO TABS
1.0000 | ORAL_TABLET | Freq: Every day | ORAL | 2 refills | Status: DC
  Filled 2021-08-06: qty 90, 90d supply, fill #0
  Filled 2022-01-18: qty 90, 90d supply, fill #1
  Filled 2022-05-10: qty 90, 90d supply, fill #2

## 2021-08-09 ENCOUNTER — Other Ambulatory Visit (HOSPITAL_COMMUNITY): Payer: Self-pay

## 2021-08-09 ENCOUNTER — Other Ambulatory Visit: Payer: Self-pay

## 2021-08-09 ENCOUNTER — Ambulatory Visit
Admission: RE | Admit: 2021-08-09 | Discharge: 2021-08-09 | Disposition: A | Discharge status: Home / Self Care | Payer: 59 | Source: Ambulatory Visit | Attending: Internal Medicine | Admitting: Internal Medicine

## 2021-08-09 DIAGNOSIS — G894 Chronic pain syndrome: Secondary | ICD-10-CM | POA: Diagnosis not present

## 2021-08-09 DIAGNOSIS — G4700 Insomnia, unspecified: Secondary | ICD-10-CM | POA: Diagnosis not present

## 2021-08-09 DIAGNOSIS — M7552 Bursitis of left shoulder: Secondary | ICD-10-CM | POA: Diagnosis not present

## 2021-08-09 DIAGNOSIS — N281 Cyst of kidney, acquired: Secondary | ICD-10-CM | POA: Diagnosis not present

## 2021-08-09 DIAGNOSIS — D696 Thrombocytopenia, unspecified: Secondary | ICD-10-CM | POA: Insufficient documentation

## 2021-08-09 DIAGNOSIS — M25569 Pain in unspecified knee: Secondary | ICD-10-CM | POA: Diagnosis not present

## 2021-08-09 MED ORDER — TRAMADOL HCL 50 MG PO TABS
ORAL_TABLET | ORAL | 1 refills | Status: DC
  Filled 2021-08-09 – 2021-08-16 (×2): qty 240, 30d supply, fill #0
  Filled 2021-12-13: qty 240, 30d supply, fill #1

## 2021-08-09 MED ORDER — MELOXICAM 15 MG PO TABS
ORAL_TABLET | ORAL | 1 refills | Status: DC
  Filled 2021-08-09 – 2021-09-12 (×2): qty 30, 30d supply, fill #0
  Filled 2021-10-14: qty 30, 30d supply, fill #1

## 2021-08-09 MED ORDER — DULOXETINE HCL 60 MG PO CPEP
ORAL_CAPSULE | ORAL | 1 refills | Status: DC
  Filled 2021-08-09: qty 30, 30d supply, fill #0
  Filled 2021-09-27: qty 30, 30d supply, fill #1

## 2021-08-12 ENCOUNTER — Encounter: Payer: Self-pay | Admitting: Internal Medicine

## 2021-08-12 ENCOUNTER — Other Ambulatory Visit (HOSPITAL_COMMUNITY): Payer: Self-pay

## 2021-08-12 NOTE — Progress Notes (Signed)
I spoke to patient regarding the results of the ultrasound negative for any liver or spleen pathology to explain her low platelets. 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 However, platelets table at 80s would not recommend any treatment at this time.  The patients fatigue not explained by current blood counts. Other causes should be worked up by PCP.   Patient will keep follow up with [name] as planned.  GB

## 2021-08-13 ENCOUNTER — Other Ambulatory Visit (HOSPITAL_COMMUNITY): Payer: Self-pay

## 2021-08-13 MED ORDER — FLUTICASONE-SALMETEROL 250-50 MCG/ACT IN AEPB
INHALATION_SPRAY | RESPIRATORY_TRACT | 10 refills | Status: DC
  Filled 2021-08-13: qty 60, 30d supply, fill #0
  Filled 2021-09-12: qty 60, 30d supply, fill #1
  Filled 2021-10-14: qty 60, 30d supply, fill #2
  Filled 2021-11-17: qty 60, 30d supply, fill #3
  Filled 2021-12-17: qty 60, 30d supply, fill #4
  Filled 2022-01-18: qty 60, 30d supply, fill #5
  Filled 2022-02-14: qty 60, 30d supply, fill #6
  Filled 2022-03-20: qty 60, 30d supply, fill #7
  Filled 2022-04-17: qty 60, 30d supply, fill #8
  Filled 2022-05-24: qty 60, 30d supply, fill #9
  Filled 2022-06-22: qty 60, 30d supply, fill #10

## 2021-08-13 MED ORDER — MONTELUKAST SODIUM 10 MG PO TABS
ORAL_TABLET | Freq: Every day | ORAL | 3 refills | Status: DC
  Filled 2021-08-13: qty 90, fill #0
  Filled 2021-10-14: qty 90, 90d supply, fill #0
  Filled 2022-01-18: qty 90, 90d supply, fill #1
  Filled 2022-04-17: qty 90, 90d supply, fill #2
  Filled 2022-07-23: qty 90, 90d supply, fill #3

## 2021-08-16 ENCOUNTER — Other Ambulatory Visit (HOSPITAL_COMMUNITY): Payer: Self-pay

## 2021-08-16 NOTE — Assessment & Plan Note (Signed)
This is been minimal especially since she lost weight. Plan-conservative management.

## 2021-08-16 NOTE — Assessment & Plan Note (Addendum)
Better control in recent years.  Still sensitive to environmental triggers as noted. Plan-continue current meds

## 2021-08-17 ENCOUNTER — Other Ambulatory Visit: Payer: Self-pay

## 2021-08-17 ENCOUNTER — Encounter (INDEPENDENT_AMBULATORY_CARE_PROVIDER_SITE_OTHER): Payer: Self-pay | Admitting: Adult Health

## 2021-08-17 ENCOUNTER — Ambulatory Visit (INDEPENDENT_AMBULATORY_CARE_PROVIDER_SITE_OTHER): Payer: 59 | Admitting: Adult Health

## 2021-08-17 VITALS — BP 118/66 | HR 78 | Temp 98.3°F | Ht 61.0 in | Wt 152.0 lb

## 2021-08-17 DIAGNOSIS — Z6828 Body mass index (BMI) 28.0-28.9, adult: Secondary | ICD-10-CM | POA: Diagnosis not present

## 2021-08-17 DIAGNOSIS — E669 Obesity, unspecified: Secondary | ICD-10-CM | POA: Diagnosis not present

## 2021-08-17 DIAGNOSIS — R7303 Prediabetes: Secondary | ICD-10-CM | POA: Diagnosis not present

## 2021-08-17 DIAGNOSIS — D696 Thrombocytopenia, unspecified: Secondary | ICD-10-CM

## 2021-08-17 NOTE — Progress Notes (Signed)
Chief Complaint:   OBESITY Katherine Tanner is here to discuss her progress with her obesity treatment plan along with follow-up of her obesity related diagnoses. Katherine Tanner is on the Category 2 Plan and states she is following her eating plan approximately 100% of the time. Katherine Tanner states she is doing chair exercises/biking for 20-30 minutes 5 times per week.  Today's visit was #: 73 Starting weight: 198 lbs Starting date: 08/30/2018 Today's weight: 152 lbs Today's date: 08/17/2021 Total lbs lost to date: 46 lbs Total lbs lost since last in-office visit: 2 lbs  Interim History:  Katherine Tanner is down another 2 pounds for a total of 46 pounds. On 10/12/2020, RMR - 1227. Recommend re-checking RMR this Spring.  Subjective:   1. Thrombocytopenia ([place]) On 07/28/2021 Oncology/Hematology notes- Thrombocytopenia ([place]) # Thrombocytopenia: Mild/moderate [70-80s since Jan 2023].  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  I do long discussion with patient regarding multiple etiologies including but not limited to liver disease/medications/autoimmune disease-ITP etc.  Less likely any malignant causes.  Discussed that ITP is a diagnosis of exclusion. Discussed the role of a bone marrow biopsy for further evaluation of hematologic disorder-given the less likely chance of malignancy/I think is reasonable to hold off any bone marrow biopsy at this time.    #In general the recommendations to initiate ITP treatment is platelets are less than 50 or lower.  Discussed with patient that ITP is usually treated with steroids.  Hold off any steroids at this time as patient is fairly asymptomatic.  Monitor closely for easy bruising/nosebleeds while on Mobic.   Recommend further work-up including-CBC  LDH; review of peripheral smear;   Ultrasound abdomen-to rule out cirrhosis/splenomegaly.  On 08/09/2021, US abdomen complete: IMPRESSION: Subcentimeter bilateral renal probable cysts, otherwise unremarkable abdominal  ultrasound.  2. Prediabetes She is taking metformin 500 mg BID with meals. Tolerating well and reports well controlled hunger levels.  Assessment/Plan:   1. Thrombocytopenia ([place]) Follow-up with Oncology/Hematology every 4 weeks.  2. Prediabetes Continue metformin 500 mg BID with meals.  3. Obesity with current BMI 28.9  Katherine Tanner is currently in the action stage of change. As such, her goal is to continue with weight loss efforts. She has agreed to the Category 2 Plan.   Exercise goals:  As is.  Behavioral modification strategies: increasing lean protein intake, decreasing simple carbohydrates, meal planning and cooking strategies, keeping healthy foods in the home, and planning for success.  Katherine Tanner has agreed to follow-up with our clinic in 6 weeks. She was informed of the importance of frequent follow-up visits to maximize her success with intensive lifestyle modifications for her multiple health conditions.   Objective:   Blood pressure 118/66, pulse 78, temperature 98.3 F (36.8 C), height _0  (1.549 m), weight 152 lb (68.9 kg), last menstrual period 02/20/2014, SpO2 97 %. Body mass index is 28.72 kg/m.  General: Cooperative, alert, well developed, in no acute distress. HEENT: Conjunctivae and lids unremarkable. Cardiovascular: Regular rhythm.  Lungs: Normal work of breathing. Neurologic: No focal deficits.   Lab Results  Component Value Date   CREATININE 0.56 07/09/2021   BUN 17 07/09/2021   NA 142 07/09/2021   K 4.1 07/09/2021   CL 105 07/09/2021   CO2 33 (H) 07/09/2021   Lab Results  Component Value Date   ALT 31 07/09/2021   AST 22 07/09/2021   ALKPHOS 97 07/09/2021   BILITOT 0.6 07/09/2021   Lab Results  Component Value Date   HGBA1C 5.8  07/09/2021   HGBA1C 5.6 06/24/2021   HGBA1C 5.8 (H) 03/22/2021   HGBA1C 5.6 10/12/2020   HGBA1C 5.8 07/07/2020   Lab Results  Component Value Date   INSULIN 7.0 06/24/2021   INSULIN 6.9 03/22/2021   INSULIN  6.0 10/12/2020   INSULIN 11.2 03/23/2020   INSULIN 6.2 12/24/2019   Lab Results  Component Value Date   TSH 0.91 07/09/2021   Lab Results  Component Value Date   CHOL 146 07/09/2021   HDL 61.70 07/09/2021   LDLCALC 77 07/09/2021   LDLDIRECT 147.9 12/17/2012   TRIG 35.0 07/09/2021   CHOLHDL 2 07/09/2021   Lab Results  Component Value Date   VD25OH 54.23 07/09/2021   VD25OH 79.7 06/24/2021   VD25OH 69.3 03/22/2021   Lab Results  Component Value Date   WBC 5.9 07/28/2021   HGB 12.7 07/28/2021   HCT 38.7 07/28/2021   MCV 88.6 07/28/2021   PLT 82 (L) 07/28/2021   Lab Results  Component Value Date   IRON 63 07/07/2020   Attestation Statements:   Reviewed by clinician on day of visit: allergies, medications, problem list, medical history, surgical history, family history, social history, and previous encounter notes.  Time spent on visit including pre-visit chart review and post-visit care and charting was 27 minutes.   I, Water quality scientist, CMA, am acting as Location manager for Mina Marble, NP.  I have reviewed the above documentation for accuracy and completeness, and I agree with the above. -  Leander Tout d. Eryn Marandola, NP-C

## 2021-08-25 ENCOUNTER — Inpatient Hospital Stay: Payer: 59 | Attending: Internal Medicine

## 2021-08-25 ENCOUNTER — Other Ambulatory Visit: Payer: Self-pay

## 2021-08-25 DIAGNOSIS — D696 Thrombocytopenia, unspecified: Secondary | ICD-10-CM | POA: Insufficient documentation

## 2021-08-25 LAB — CBC WITH DIFFERENTIAL/PLATELET
Abs Immature Granulocytes: 0.01 10*3/uL (ref 0.00–0.07)
Basophils Absolute: 0.1 10*3/uL (ref 0.0–0.1)
Basophils Relative: 1 %
Eosinophils Absolute: 0.1 10*3/uL (ref 0.0–0.5)
Eosinophils Relative: 2 %
HCT: 37.8 % (ref 36.0–46.0)
Hemoglobin: 12.2 g/dL (ref 12.0–15.0)
Immature Granulocytes: 0 %
Lymphocytes Relative: 38 %
Lymphs Abs: 2.2 10*3/uL (ref 0.7–4.0)
MCH: 28.9 pg (ref 26.0–34.0)
MCHC: 32.3 g/dL (ref 30.0–36.0)
MCV: 89.6 fL (ref 80.0–100.0)
Monocytes Absolute: 0.7 10*3/uL (ref 0.1–1.0)
Monocytes Relative: 12 %
Neutro Abs: 2.8 10*3/uL (ref 1.7–7.7)
Neutrophils Relative %: 47 %
Platelets: 85 10*3/uL — ABNORMAL LOW (ref 150–400)
RBC: 4.22 MIL/uL (ref 3.87–5.11)
RDW: 14 % (ref 11.5–15.5)
WBC: 5.9 10*3/uL (ref 4.0–10.5)
nRBC: 0 % (ref 0.0–0.2)

## 2021-09-13 ENCOUNTER — Other Ambulatory Visit (HOSPITAL_COMMUNITY): Payer: Self-pay

## 2021-09-13 ENCOUNTER — Ambulatory Visit: Payer: 59 | Admitting: Internal Medicine

## 2021-09-22 ENCOUNTER — Other Ambulatory Visit: Payer: Self-pay

## 2021-09-22 ENCOUNTER — Inpatient Hospital Stay: Payer: 59 | Attending: Internal Medicine

## 2021-09-22 DIAGNOSIS — D696 Thrombocytopenia, unspecified: Secondary | ICD-10-CM | POA: Diagnosis not present

## 2021-09-22 LAB — CBC WITH DIFFERENTIAL/PLATELET
Abs Immature Granulocytes: 0.01 10*3/uL (ref 0.00–0.07)
Basophils Absolute: 0 10*3/uL (ref 0.0–0.1)
Basophils Relative: 1 %
Eosinophils Absolute: 0.1 10*3/uL (ref 0.0–0.5)
Eosinophils Relative: 2 %
HCT: 37.2 % (ref 36.0–46.0)
Hemoglobin: 12.1 g/dL (ref 12.0–15.0)
Immature Granulocytes: 0 %
Lymphocytes Relative: 40 %
Lymphs Abs: 2.3 10*3/uL (ref 0.7–4.0)
MCH: 29.4 pg (ref 26.0–34.0)
MCHC: 32.5 g/dL (ref 30.0–36.0)
MCV: 90.3 fL (ref 80.0–100.0)
Monocytes Absolute: 0.7 10*3/uL (ref 0.1–1.0)
Monocytes Relative: 12 %
Neutro Abs: 2.5 10*3/uL (ref 1.7–7.7)
Neutrophils Relative %: 45 %
Platelets: 93 10*3/uL — ABNORMAL LOW (ref 150–400)
RBC: 4.12 MIL/uL (ref 3.87–5.11)
RDW: 13.7 % (ref 11.5–15.5)
WBC: 5.6 10*3/uL (ref 4.0–10.5)
nRBC: 0 % (ref 0.0–0.2)

## 2021-09-28 ENCOUNTER — Other Ambulatory Visit: Payer: Self-pay

## 2021-09-28 ENCOUNTER — Encounter (INDEPENDENT_AMBULATORY_CARE_PROVIDER_SITE_OTHER): Payer: Self-pay | Admitting: Adult Health

## 2021-09-28 ENCOUNTER — Other Ambulatory Visit (HOSPITAL_COMMUNITY): Payer: Self-pay

## 2021-09-28 ENCOUNTER — Ambulatory Visit (INDEPENDENT_AMBULATORY_CARE_PROVIDER_SITE_OTHER): Payer: 59 | Admitting: Adult Health

## 2021-09-28 VITALS — BP 120/66 | HR 77 | Temp 98.1°F | Ht 61.0 in | Wt 150.0 lb

## 2021-09-28 DIAGNOSIS — E559 Vitamin D deficiency, unspecified: Secondary | ICD-10-CM

## 2021-09-28 DIAGNOSIS — R7303 Prediabetes: Secondary | ICD-10-CM | POA: Diagnosis not present

## 2021-09-28 DIAGNOSIS — E669 Obesity, unspecified: Secondary | ICD-10-CM

## 2021-09-28 DIAGNOSIS — I10 Essential (primary) hypertension: Secondary | ICD-10-CM

## 2021-09-28 DIAGNOSIS — Z6828 Body mass index (BMI) 28.0-28.9, adult: Secondary | ICD-10-CM

## 2021-09-28 DIAGNOSIS — Z9189 Other specified personal risk factors, not elsewhere classified: Secondary | ICD-10-CM

## 2021-09-28 MED ORDER — METFORMIN HCL 500 MG PO TABS
500.0000 mg | ORAL_TABLET | Freq: Two times a day (BID) | ORAL | 0 refills | Status: DC
  Filled 2021-09-28: qty 180, 90d supply, fill #0

## 2021-09-28 NOTE — Progress Notes (Signed)
? ? ? ?Chief Complaint:  ? ?OBESITY ?Katherine Tanner is here to discuss her progress with her obesity treatment plan along with follow-up of her obesity related diagnoses. Katherine Tanner is on the Category 2 Plan and states she is following her eating plan approximately 99.8% of the time. Katherine Tanner states she is doing videos and using the stationary bike for 30 minutes 5 times per week. ? ?Today's visit was #: 52 ?Starting weight: 198 lbs ?Starting date: 08/30/2018 ?Today's weight: 150 lbs ?Today's date: 09/28/2021 ?Total lbs lost to date: 48 lbs ?Total lbs lost since last in-office visit: 2 lbs ? ?Interim History:  ?Katherine Tanner participates in [employer] exercise videos. ?She will start to decrease her work week - 36 hours per week- beginning next week! ?On 10/12/2020, RMR 1227. ?On 09/28/2021, RMR 1512.  She has increased her RMR by 285 calories. ? ?Subjective:  ? ?1. Essential hypertension ?BP/HR excellent at office visit. ? ?2. Prediabetes ?She is on metformin 500 mg BID with meals. ?She reports stable appetite and denise GI upset. ? ?3. Vitamin D deficiency ?She is currently taking OTC vitamin D3 1000 IU each day. She denies nausea, vomiting or muscle weakness. ? ?4. At risk for diabetes mellitus ?Katherine Tanner is at higher than average risk for developing diabetes due taking metformin for prediabetes.  ? ?Assessment/Plan:  ? ?1. Essential hypertension ?Check labs. ? ?- Comprehensive metabolic panel ? ?2. Prediabetes ?Refill metformin 500 mg BID with meals. ? ?- Hemoglobin A1c ?- Insulin, random ?- Vitamin B12 ?- Refill metFORMIN (GLUCOPHAGE) 500 MG tablet; Take 1 tablet by mouth 2 times daily (with breakfast and with lunch.)  Dispense: 180 tablet; Refill: 0 ? ?3. Vitamin D deficiency ?Check vitamin D level today. ? ?- VITAMIN D 25 Hydroxy (Vit-D Deficiency, Fractures) ? ?4. At risk for diabetes mellitus ?Katherine Tanner was given approximately 15 minutes of diabetic education and counseling today. We discussed intensive lifestyle modifications  today with an emphasis on weight loss as well as increasing exercise and decreasing simple carbohydrates in her diet. We also reviewed medication options with an emphasis on risk versus benefits of those discussed. ? ?Repetitive spaced learning was employed today to elicit superior memory formation and behavioral change. ? ?5. Obesity with current BMI 28.5 ? ?Katherine Tanner is currently in the action stage of change. As such, her goal is to continue with weight loss efforts. She has agreed to the Category 2 Plan.  ? ?To account for increased RMR- increase protein at lunch and dinner or increase snack calories with 8 oz protein at dinner. ? ?1) 4-6 oz protein at lunch.  8 oz protein at dinner. ? ?2)  If 250 snack calories/day- 4 oz protein at lunch, 8 oz protein at dinner.  ? ?Exercise goals:  As is. ? ?Behavioral modification strategies: increasing lean protein intake, decreasing simple carbohydrates, meal planning and cooking strategies, keeping healthy foods in the home, and planning for success. ? ?Katherine Tanner has agreed to follow-up with our clinic in 6 weeks. She was informed of the importance of frequent follow-up visits to maximize her success with intensive lifestyle modifications for her multiple health conditions.  ? ?Katherine Tanner was informed we would discuss her lab results at her next visit unless there is a critical issue that needs to be addressed sooner. Katherine Tanner agreed to keep her next visit at the agreed upon time to discuss these results. ? ?Objective:  ? ?Blood pressure 120/66, pulse 77, temperature 98.1 ?F (36.7 ?C), height '5\' 1"'$  (1.549 m), weight 150 lb (  68 kg), last menstrual period 02/20/2014, SpO2 97 %. ?Body mass index is 28.34 kg/m?. ? ?General: Cooperative, alert, well developed, in no acute distress. ?HEENT: Conjunctivae and lids unremarkable. ?Cardiovascular: Regular rhythm.  ?Lungs: Normal work of breathing. ?Neurologic: No focal deficits.  ? ?Lab Results  ?Component Value Date  ? CREATININE 0.56  07/09/2021  ? BUN 17 07/09/2021  ? NA 142 07/09/2021  ? K 4.1 07/09/2021  ? CL 105 07/09/2021  ? CO2 33 (H) 07/09/2021  ? ?Lab Results  ?Component Value Date  ? ALT 31 07/09/2021  ? AST 22 07/09/2021  ? ALKPHOS 97 07/09/2021  ? BILITOT 0.6 07/09/2021  ? ?Lab Results  ?Component Value Date  ? HGBA1C 5.8 07/09/2021  ? HGBA1C 5.6 06/24/2021  ? HGBA1C 5.8 (H) 03/22/2021  ? HGBA1C 5.6 10/12/2020  ? HGBA1C 5.8 07/07/2020  ? ?Lab Results  ?Component Value Date  ? INSULIN 7.0 06/24/2021  ? INSULIN 6.9 03/22/2021  ? INSULIN 6.0 10/12/2020  ? INSULIN 11.2 03/23/2020  ? INSULIN 6.2 12/24/2019  ? ?Lab Results  ?Component Value Date  ? TSH 0.91 07/09/2021  ? ?Lab Results  ?Component Value Date  ? CHOL 146 07/09/2021  ? HDL 61.70 07/09/2021  ? [place] 77 07/09/2021  ? LDLDIRECT 147.9 12/17/2012  ? TRIG 35.0 07/09/2021  ? CHOLHDL 2 07/09/2021  ? ?Lab Results  ?Component Value Date  ? VD25OH 54.23 07/09/2021  ? VD25OH 79.7 06/24/2021  ? VD25OH 69.3 03/22/2021  ? ?Lab Results  ?Component Value Date  ? WBC 5.6 09/22/2021  ? HGB 12.1 09/22/2021  ? HCT 37.2 09/22/2021  ? MCV 90.3 09/22/2021  ? PLT 93 (L) 09/22/2021  ? ?Lab Results  ?Component Value Date  ? IRON 63 07/07/2020  ? ?Attestation Statements:  ? ?Reviewed by clinician on day of visit: allergies, medications, problem list, medical history, surgical history, family history, social history, and previous encounter notes. ? ?I, Water quality scientist, CMA, am acting as Location manager for Mina Marble, NP. ? ?I have reviewed the above documentation for accuracy and completeness, and I agree with the above. -  Dae Antonucci d. Sherril Shipman, NP-C ?

## 2021-09-29 LAB — COMPREHENSIVE METABOLIC PANEL
ALT: 27 IU/L (ref 0–32)
AST: 24 IU/L (ref 0–40)
Albumin/Globulin Ratio: 2.3 — ABNORMAL HIGH (ref 1.2–2.2)
Albumin: 4.9 g/dL (ref 3.8–4.9)
Alkaline Phosphatase: 115 IU/L (ref 44–121)
BUN/Creatinine Ratio: 24 — ABNORMAL HIGH (ref 9–23)
BUN: 15 mg/dL (ref 6–24)
Bilirubin Total: 0.5 mg/dL (ref 0.0–1.2)
CO2: 27 mmol/L (ref 20–29)
Calcium: 9.7 mg/dL (ref 8.7–10.2)
Chloride: 103 mmol/L (ref 96–106)
Creatinine, Ser: 0.63 mg/dL (ref 0.57–1.00)
Globulin, Total: 2.1 g/dL (ref 1.5–4.5)
Glucose: 88 mg/dL (ref 70–99)
Potassium: 4.4 mmol/L (ref 3.5–5.2)
Sodium: 145 mmol/L — ABNORMAL HIGH (ref 134–144)
Total Protein: 7 g/dL (ref 6.0–8.5)
eGFR: 102 mL/min/{1.73_m2} (ref >59)

## 2021-09-29 LAB — HEMOGLOBIN A1C
Est. average glucose Bld gHb Est-mCnc: 114 mg/dL
Hgb A1c MFr Bld: 5.6 % (ref 4.8–5.6)

## 2021-09-29 LAB — VITAMIN B12: Vitamin B-12: >2000 pg/mL — ABNORMAL HIGH (ref 232–1245)

## 2021-09-29 LAB — VITAMIN D 25 HYDROXY (VIT D DEFICIENCY, FRACTURES): Vit D, 25-Hydroxy: 79.5 ng/mL (ref 30.0–100.0)

## 2021-09-29 LAB — INSULIN, RANDOM: INSULIN: 3.7 u[IU]/mL (ref 2.6–24.9)

## 2021-10-04 ENCOUNTER — Other Ambulatory Visit (HOSPITAL_COMMUNITY): Payer: Self-pay

## 2021-10-04 DIAGNOSIS — G4700 Insomnia, unspecified: Secondary | ICD-10-CM | POA: Diagnosis not present

## 2021-10-04 DIAGNOSIS — M7552 Bursitis of left shoulder: Secondary | ICD-10-CM | POA: Diagnosis not present

## 2021-10-04 DIAGNOSIS — G894 Chronic pain syndrome: Secondary | ICD-10-CM | POA: Diagnosis not present

## 2021-10-04 DIAGNOSIS — M25569 Pain in unspecified knee: Secondary | ICD-10-CM | POA: Diagnosis not present

## 2021-10-04 MED ORDER — DULOXETINE HCL 60 MG PO CPEP
60.0000 mg | ORAL_CAPSULE | Freq: Every evening | ORAL | 1 refills | Status: DC
  Filled 2021-10-04 – 2021-10-14 (×2): qty 30, 30d supply, fill #0
  Filled 2021-11-20: qty 30, 30d supply, fill #1

## 2021-10-15 ENCOUNTER — Other Ambulatory Visit (HOSPITAL_COMMUNITY): Payer: Self-pay

## 2021-10-20 ENCOUNTER — Inpatient Hospital Stay: Payer: 59 | Attending: Internal Medicine

## 2021-10-20 DIAGNOSIS — D696 Thrombocytopenia, unspecified: Secondary | ICD-10-CM | POA: Insufficient documentation

## 2021-10-20 LAB — CBC WITH DIFFERENTIAL/PLATELET
Abs Immature Granulocytes: 0.01 10*3/uL (ref 0.00–0.07)
Basophils Absolute: 0 10*3/uL (ref 0.0–0.1)
Basophils Relative: 1 %
Eosinophils Absolute: 0.1 10*3/uL (ref 0.0–0.5)
Eosinophils Relative: 2 %
HCT: 38.4 % (ref 36.0–46.0)
Hemoglobin: 12.3 g/dL (ref 12.0–15.0)
Immature Granulocytes: 0 %
Lymphocytes Relative: 38 %
Lymphs Abs: 2.2 10*3/uL (ref 0.7–4.0)
MCH: 29.1 pg (ref 26.0–34.0)
MCHC: 32 g/dL (ref 30.0–36.0)
MCV: 90.8 fL (ref 80.0–100.0)
Monocytes Absolute: 0.6 10*3/uL (ref 0.1–1.0)
Monocytes Relative: 11 %
Neutro Abs: 2.8 10*3/uL (ref 1.7–7.7)
Neutrophils Relative %: 48 %
Platelets: 88 10*3/uL — ABNORMAL LOW (ref 150–400)
RBC: 4.23 MIL/uL (ref 3.87–5.11)
RDW: 13.5 % (ref 11.5–15.5)
WBC: 5.7 10*3/uL (ref 4.0–10.5)
nRBC: 0 % (ref 0.0–0.2)

## 2021-10-20 LAB — BASIC METABOLIC PANEL
Anion gap: 6 (ref 5–15)
BUN: 23 mg/dL — ABNORMAL HIGH (ref 6–20)
CO2: 30 mmol/L (ref 22–32)
Calcium: 9.3 mg/dL (ref 8.9–10.3)
Chloride: 102 mmol/L (ref 98–111)
Creatinine, Ser: 0.59 mg/dL (ref 0.44–1.00)
GFR, Estimated: >60 mL/min (ref >60)
Glucose, Bld: 90 mg/dL (ref 70–99)
Potassium: 4.3 mmol/L (ref 3.5–5.1)
Sodium: 138 mmol/L (ref 135–145)

## 2021-10-21 ENCOUNTER — Other Ambulatory Visit (HOSPITAL_COMMUNITY): Payer: Self-pay

## 2021-10-25 DIAGNOSIS — H04123 Dry eye syndrome of bilateral lacrimal glands: Secondary | ICD-10-CM | POA: Diagnosis not present

## 2021-10-25 DIAGNOSIS — H52223 Regular astigmatism, bilateral: Secondary | ICD-10-CM | POA: Diagnosis not present

## 2021-10-25 DIAGNOSIS — Z83511 Family history of glaucoma: Secondary | ICD-10-CM | POA: Diagnosis not present

## 2021-10-25 DIAGNOSIS — H524 Presbyopia: Secondary | ICD-10-CM | POA: Diagnosis not present

## 2021-10-25 DIAGNOSIS — H5203 Hypermetropia, bilateral: Secondary | ICD-10-CM | POA: Diagnosis not present

## 2021-11-09 ENCOUNTER — Ambulatory Visit (INDEPENDENT_AMBULATORY_CARE_PROVIDER_SITE_OTHER): Payer: 59 | Admitting: Adult Health

## 2021-11-09 ENCOUNTER — Encounter (INDEPENDENT_AMBULATORY_CARE_PROVIDER_SITE_OTHER): Payer: Self-pay | Admitting: Adult Health

## 2021-11-09 VITALS — BP 109/67 | HR 85 | Temp 98.1°F | Ht 61.0 in | Wt 151.0 lb

## 2021-11-09 DIAGNOSIS — Z6828 Body mass index (BMI) 28.0-28.9, adult: Secondary | ICD-10-CM | POA: Diagnosis not present

## 2021-11-09 DIAGNOSIS — E669 Obesity, unspecified: Secondary | ICD-10-CM

## 2021-11-09 DIAGNOSIS — I10 Essential (primary) hypertension: Secondary | ICD-10-CM

## 2021-11-15 NOTE — Progress Notes (Signed)
? ? ? ?Chief Complaint:  ? ?OBESITY ?Katherine Tanner is here to discuss her progress with her obesity treatment plan along with follow-up of her obesity related diagnoses. Katherine Tanner is on the Category 2 Plan and states she is following her eating plan approximately 100% of the time. Katherine Tanner states she is riding her stationary bike and doing YouTube videos for 30 minutes 5 times per week. ? ?Today's visit was #: 53 ?Starting weight: 198 lbs ?Starting date: 08/30/2018 ?Today's weight: 151 lbs ?Today's date: 11/09/21 ?Total lbs lost to date: 47 lbs ?Total lbs lost since last in-office visit: +1 ? ?Interim History:  ?She is now working 9-hour shifts 4 times per week. ?She consumed 36 ounces of water just prior to office visit. ?[employer] reflects water weight is up 3.8 pounds. ? ?Subjective:  ? ?1. Essential hypertension ?BP stable yet soft at office visit. ?She denies symptoms of hypotension. ?Taking one half Hyzaar 100/25 mg daily. ? ?Assessment/Plan:  ? ?1. Essential hypertension ?Check BP/heart rate at home, bring log to next office visit. ? ?2. Obesity with current BMI 28.6 ?Katherine Tanner is currently in the action stage of change. As such, her goal is to continue with weight loss efforts. She has agreed to the Category 2 Plan.  ? ?Exercise goals: Continue current regimen. ? ?Behavioral modification strategies: increasing lean protein intake, decreasing simple carbohydrates, meal planning and cooking strategies, keeping healthy foods in the home, and planning for success. ? ?Katherine Tanner has agreed to follow-up with our clinic in 6 weeks fasting.  ? ?She was informed of the importance of frequent follow-up visits to maximize her success with intensive lifestyle modifications for her multiple health conditions.  ? ? ?Objective:  ? ?Blood pressure 109/67, pulse 85, temperature 98.1 ?F (36.7 ?C), height '5\' 1"'$  (1.549 m), weight 151 lb (68.5 kg), last menstrual period 02/20/2014, SpO2 100 %. ?Body mass index is 28.53 kg/m?. ? ?General:  Cooperative, alert, well developed, in no acute distress. ?HEENT: Conjunctivae and lids unremarkable. ?Cardiovascular: Regular rhythm.  ?Lungs: Normal work of breathing. ?Neurologic: No focal deficits.  ? ?Lab Results  ?Component Value Date  ? CREATININE 0.59 10/20/2021  ? BUN 23 (H) 10/20/2021  ? NA 138 10/20/2021  ? K 4.3 10/20/2021  ? CL 102 10/20/2021  ? CO2 30 10/20/2021  ? ?Lab Results  ?Component Value Date  ? ALT 27 09/28/2021  ? AST 24 09/28/2021  ? ALKPHOS 115 09/28/2021  ? BILITOT 0.5 09/28/2021  ? ?Lab Results  ?Component Value Date  ? HGBA1C 5.6 09/28/2021  ? HGBA1C 5.8 07/09/2021  ? HGBA1C 5.6 06/24/2021  ? HGBA1C 5.8 (H) 03/22/2021  ? HGBA1C 5.6 10/12/2020  ? ?Lab Results  ?Component Value Date  ? INSULIN 3.7 09/28/2021  ? INSULIN 7.0 06/24/2021  ? INSULIN 6.9 03/22/2021  ? INSULIN 6.0 10/12/2020  ? INSULIN 11.2 03/23/2020  ? ?Lab Results  ?Component Value Date  ? TSH 0.91 07/09/2021  ? ?Lab Results  ?Component Value Date  ? CHOL 146 07/09/2021  ? HDL 61.70 07/09/2021  ? [place] 77 07/09/2021  ? LDLDIRECT 147.9 12/17/2012  ? TRIG 35.0 07/09/2021  ? CHOLHDL 2 07/09/2021  ? ?Lab Results  ?Component Value Date  ? VD25OH 79.5 09/28/2021  ? VD25OH 54.23 07/09/2021  ? VD25OH 79.7 06/24/2021  ? ?Lab Results  ?Component Value Date  ? WBC 5.7 10/20/2021  ? HGB 12.3 10/20/2021  ? HCT 38.4 10/20/2021  ? MCV 90.8 10/20/2021  ? PLT 88 (L) 10/20/2021  ? ?Lab Results  ?  Component Value Date  ? IRON 63 07/07/2020  ? ? ?Attestation Statements:  ? ?Reviewed by clinician on day of visit: allergies, medications, problem list, medical history, surgical history, family history, social history, and previous encounter notes. ? ?Time spent on visit including pre-visit chart review and post-visit care and charting was 25 minutes.  ? ?I, Georgianne Fick, FNP, am acting as Location manager for Mina Marble, NP. ? ?I have reviewed the above documentation for accuracy and completeness, and I agree with the above. -  Addalyn Speedy d.  Parv Manthey, NP-C  ?

## 2021-11-16 ENCOUNTER — Other Ambulatory Visit: Payer: Self-pay

## 2021-11-16 DIAGNOSIS — D696 Thrombocytopenia, unspecified: Secondary | ICD-10-CM

## 2021-11-17 ENCOUNTER — Encounter: Payer: Self-pay | Admitting: Internal Medicine

## 2021-11-17 ENCOUNTER — Inpatient Hospital Stay (HOSPITAL_BASED_OUTPATIENT_CLINIC_OR_DEPARTMENT_OTHER): Payer: 59 | Admitting: Internal Medicine

## 2021-11-17 ENCOUNTER — Inpatient Hospital Stay: Payer: 59 | Attending: Internal Medicine

## 2021-11-17 DIAGNOSIS — D693 Immune thrombocytopenic purpura: Secondary | ICD-10-CM | POA: Diagnosis not present

## 2021-11-17 DIAGNOSIS — Z7984 Long term (current) use of oral hypoglycemic drugs: Secondary | ICD-10-CM | POA: Diagnosis not present

## 2021-11-17 DIAGNOSIS — Z79899 Other long term (current) drug therapy: Secondary | ICD-10-CM | POA: Insufficient documentation

## 2021-11-17 DIAGNOSIS — D696 Thrombocytopenia, unspecified: Secondary | ICD-10-CM

## 2021-11-17 LAB — CBC WITH DIFFERENTIAL/PLATELET
Abs Immature Granulocytes: 0 10*3/uL (ref 0.00–0.07)
Basophils Absolute: 0 10*3/uL (ref 0.0–0.1)
Basophils Relative: 1 %
Eosinophils Absolute: 0.1 10*3/uL (ref 0.0–0.5)
Eosinophils Relative: 1 %
HCT: 37.4 % (ref 36.0–46.0)
Hemoglobin: 12.2 g/dL (ref 12.0–15.0)
Immature Granulocytes: 0 %
Lymphocytes Relative: 43 %
Lymphs Abs: 2.4 10*3/uL (ref 0.7–4.0)
MCH: 29.2 pg (ref 26.0–34.0)
MCHC: 32.6 g/dL (ref 30.0–36.0)
MCV: 89.5 fL (ref 80.0–100.0)
Monocytes Absolute: 0.7 10*3/uL (ref 0.1–1.0)
Monocytes Relative: 12 %
Neutro Abs: 2.5 10*3/uL (ref 1.7–7.7)
Neutrophils Relative %: 43 %
Platelets: 86 10*3/uL — ABNORMAL LOW (ref 150–400)
RBC: 4.18 MIL/uL (ref 3.87–5.11)
RDW: 13.4 % (ref 11.5–15.5)
WBC: 5.6 10*3/uL (ref 4.0–10.5)
nRBC: 0 % (ref 0.0–0.2)

## 2021-11-17 LAB — BASIC METABOLIC PANEL
Anion gap: 8 (ref 5–15)
BUN: 28 mg/dL — ABNORMAL HIGH (ref 6–20)
CO2: 31 mmol/L (ref 22–32)
Calcium: 9.4 mg/dL (ref 8.9–10.3)
Chloride: 101 mmol/L (ref 98–111)
Creatinine, Ser: 0.66 mg/dL (ref 0.44–1.00)
GFR, Estimated: >60 mL/min (ref >60)
Glucose, Bld: 89 mg/dL (ref 70–99)
Potassium: 4.4 mmol/L (ref 3.5–5.1)
Sodium: 140 mmol/L (ref 135–145)

## 2021-11-17 NOTE — Progress Notes (Signed)
[facility] ?CONSULT NOTE ? ?Patient Care Team: ?Tower, [name], MD as PCP - General ? ?CHIEF COMPLAINTS/PURPOSE OF CONSULTATION: Thrombocytopenia ? ?# THROMBOCYTOPENIA: [since 2019 > 100]; 2023- 70-80s; Normal- WBC/platelets [Dr.Shadad; last May 2022]; HIV/hepatitis negative as per pt [[facility]- dx:PN] ? ?#Intentional weight loss [~50 pounds over the last 1 year] ? ?HISTORY OF PRESENTING ILLNESS: Ambulating independently.  Alone. ? ?Katherine Tanner 62 y.o.  female patient with longstanding history of intermittent thrombocytopenia clinically ITP is here for follow-up. ? ?Patient has not had any further bleeding.  ? ?Review of Systems  ?Constitutional:  Positive for malaise/fatigue. Negative for chills, diaphoresis, fever and weight loss.  ?HENT:  Negative for nosebleeds and sore throat.   ?Eyes:  Negative for double vision.  ?Respiratory:  Negative for cough, hemoptysis, sputum production, shortness of breath and wheezing.   ?Cardiovascular:  Negative for chest pain, palpitations, orthopnea and leg swelling.  ?Gastrointestinal:  Negative for abdominal pain, blood in stool, constipation, diarrhea, heartburn, melena, nausea and vomiting.  ?Genitourinary:  Negative for dysuria, frequency and urgency.  ?Musculoskeletal:  Positive for back pain and joint pain.  ?Skin: Negative.  Negative for itching and rash.  ?Neurological:  Negative for dizziness, tingling, focal weakness, weakness and headaches.  ?Endo/Heme/Allergies:  Does not bruise/bleed easily.  ?Psychiatric/Behavioral:  Negative for depression. The patient is not nervous/anxious and does not have insomnia.    ? ?MEDICAL HISTORY:  ?Past Medical History:  ?Diagnosis Date  ? Allergy   ? takes Claritin daily and Flonase daily  ? Arthritis   ? Asthma   ? uses Albuterol daily as needed;Singulair nightly;DUlera daily  ? Back pain   ? Constipation   ? Fatty liver   ? Food allergy   ? GERD (gastroesophageal reflux disease)   ? occasionally  will take OTC meds but states she just watches what she eats  ? H/O hiatal hernia   ? History of bronchitis   ? last time 52yr ago  ? History of colon polyps   ? History of kidney stones   ? History of shingles   ? Hyperlipidemia   ? Hypertension   ? takes Hyzaar daily  ? Joint pain   ? Joint pain   ? Joint swelling   ? Obesity   ? Osteoarthritis   ? Pneumonia   ? hx of;last time at least 248yrago  ? PONV (postoperative nausea and vomiting)   ? Prediabetes   ? Shortness of breath   ? Sleep apnea   ? Swelling of lower extremity   ? Trouble in sleeping   ? Wheezing   ? ? ?SURGICAL HISTORY: ?Past Surgical History:  ?Procedure Laterality Date  ? BREAST EXCISIONAL BIOPSY Right   ? CARDIAC CATHETERIZATION    ? COLONOSCOPY    ? ESOPHAGOGASTRODUODENOSCOPY    ? KNEE SURGERY Right   ? arthroscopy  ? Right ganglion cyst    ? x 2  ? right lumpectomy  around 1983  ? STERIOD INJECTION Left 11/15/2013  ? Procedure: Marcaine/STEROID INJECTION;  Surgeon: JoAlta CorningMD;  [facility];  Laterality: Left;  ? TOTAL KNEE ARTHROPLASTY Right 11/15/2013  ? Procedure: RIGHT TOTAL KNEE ARTHROPLASTY;  Surgeon: JoAlta CorningMD;  [facility];  Laterality: Right;  ? TOTAL KNEE ARTHROPLASTY Left 12/30/2016  ? Procedure: TOTAL KNEE ARTHROPLASTY;  Surgeon: GrDorna LeitzMD;  [facility];  Laterality: Left;  ? TUBAL LIGATION    ? ? ?  SOCIAL HISTORY: ?Social History  ? ?Socioeconomic History  ? Marital status: Married  ?  Spouse name: Legrand Como  ? Number of children: 1  ? Years of education: Not on file  ? Highest education level: Not on file  ?Occupational History  ? Occupation: Corporate investment banker  ?  Employer: [employer]  ?  Employer: [employer]  ?Tobacco Use  ? Smoking status: Never  ? Smokeless tobacco: Never  ?Vaping Use  ? Vaping Use: Never used  ?Substance and Sexual Activity  ? Alcohol use: No  ?  Alcohol/week: 0.0 standard drinks  ? Drug use: No  ? Sexual activity:  Yes  ?Other Topics Concern  ? Not on file  ?Social History Narrative  ? Lives in [place]; works at [employer].  No alcohol.  ? ?Social Determinants of Health  ? ?Financial Resource Strain: Not on file  ?Food Insecurity: Not on file  ?Transportation Needs: Not on file  ?Physical Activity: Not on file  ?Stress: Not on file  ?Social Connections: Not on file  ?Intimate Partner Violence: Not on file  ? ? ?FAMILY HISTORY: ?Family History  ?Problem Relation Age of Onset  ? Diabetes Father   ? Cancer Father   ? Liver disease Father   ? Alcoholism Father   ? Sarcoidosis Mother   ? Glaucoma Mother   ? Hypertension Mother   ? Hyperlipidemia Mother   ? Sleep apnea Mother   ? Obesity Mother   ? Breast cancer Neg Hx   ? ? ?ALLERGIES:  is allergic to ace inhibitors, amoxicillin-pot clavulanate, influenza virus vacc split pf, and shellfish allergy. ? ?MEDICATIONS:  ?Current Outpatient Medications  ?Medication Sig Dispense Refill  ? baclofen (LIORESAL) 10 MG tablet Take 1 tablet by mouth twice a day as needed 60 tablet 3  ? baclofen (LIORESAL) 10 MG tablet Take 1 tablet by mouth 2 times daily as needed. 60 tablet 3  ? Calcium Citrate-Vitamin D (CALCIUM + D PO) Take 1 tablet by mouth daily.    ? Cholecalciferol (VITAMIN D3) 25 MCG (1000 UT) CAPS Take 1 capsule (1,000 Units total) by mouth daily. 30 capsule 0  ? diclofenac Sodium (VOLTAREN) 1 % GEL Apply 4 grams to skin four times a day 960 g 5  ? DULoxetine (CYMBALTA) 60 MG capsule Take 1 capsule by mouth at bedtime. 30 capsule 1  ? EPINEPHRINE 0.3 mg/0.3 mL IJ SOAJ injection INJECT 1 SYRINGE INTO THE MUSCLE ONCE 1 each 1  ? fluticasone-salmeterol (ADVAIR DISKUS) 250-50 MCG/ACT AEPB Inhale 1 puff into the lungs 2 times daily then rinse mouth 60 each 10  ? ipratropium-albuterol (DUONEB) 0.5-2.5 (3) MG/3ML SOLN USE 1 VIAL VIA NEBULIZER EVERY 6 HOURS AS NEEDED 90 mL 5  ? loratadine (CLARITIN) 10 MG tablet Take 10 mg by mouth daily.    ? losartan-hydrochlorothiazide (HYZAAR) 100-25  MG tablet TAKE 1 TABLET BY MOUTH ONCE DAILY 90 tablet 2  ? meloxicam (MOBIC) 15 MG tablet Take 1 tablet by mouth once a day. 30 tablet 1  ? metFORMIN (GLUCOPHAGE) 500 MG tablet Take 1 tablet by mouth 2 times daily (with breakfast and with lunch.) 180 tablet 0  ? montelukast (SINGULAIR) 10 MG tablet TAKE 1 TABLET BY MOUTH AT BEDTIME 90 tablet 3  ? Multiple Vitamin (MULTIVITAMIN PO) Take 1 tablet by mouth daily.    ? Nebulizers (COMPRESSOR/NEBULIZER) MISC Use as directed 1 each 0  ? rosuvastatin (CRESTOR) 10 MG tablet TAKE 1 TABLET BY MOUTH DAILY 90 tablet  3  ? traMADol (ULTRAM) 50 MG tablet Take 1-2 tablets (50-100 mg total) by mouth 4 (four) times daily as needed for pain 240 tablet 1  ? traMADol (ULTRAM) 50 MG tablet Take 1 - 2 tablets by mouth 4 times a day as needed for pain 240 tablet 1  ? traMADol (ULTRAM) 50 MG tablet Take 1 - 2 tablets by mouth 4 times a day as needed for pain. 240 tablet 1  ? albuterol (VENTOLIN HFA) 108 (90 Base) MCG/ACT inhaler INHALE 2 PUFFS BY MOUTH INTO THE LUNGS EVERY 6 HOURS AS NEEDED FOR WHEEZING OR SHORTNESS OF BREATH. 18 g 12  ? ?No current facility-administered medications for this visit.  ? ? ?PHYSICAL EXAMINATION: ? ?Vitals:  ? 11/17/21 1413  ?BP: 121/68  ?Pulse: 84  ?Temp: 98 ?F (36.7 ?C)  ?SpO2: 100%  ? ?Filed Weights  ? 11/17/21 1413  ?Weight: 156 lb 12.8 oz (71.1 kg)  ? ? ?Physical Exam ?Vitals and nursing note reviewed.  ?HENT:  ?   Head: Normocephalic and atraumatic.  ?   Mouth/Throat:  ?   Pharynx: Oropharynx is clear.  ?Eyes:  ?   Extraocular Movements: Extraocular movements intact.  ?   Pupils: Pupils are equal, round, and reactive to light.  ?Cardiovascular:  ?   Rate and Rhythm: Normal rate and regular rhythm.  ?Pulmonary:  ?   Comments: Decreased breath sounds bilaterally.  ?Abdominal:  ?   Palpations: Abdomen is soft.  ?Musculoskeletal:     ?   General: Normal range of motion.  ?   Cervical back: Normal range of motion.  ?Skin: ?   General: Skin is warm.   ?Neurological:  ?   General: No focal deficit present.  ?   Mental Status: She is alert and oriented to person, place, and time.  ?Psychiatric:     ?   Behavior: Behavior normal.     ?   Judgment: Judgment norma

## 2021-11-17 NOTE — Assessment & Plan Note (Addendum)
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  ? ?#Patient is currently asymptomatic.  Monthly CBC shows platelets in the 80s.  I think it is reasonable to space out the blood checks to every 2 months.  However if patient notices anything abnormal/easy bruising or nosebleeds.  She will call us sooner. ? ?# DISPOSITION: ?# 2 month- labs-cbc ?# 4 months- labs- cbc ?# follow up in 6 months- MD; labs- cbc/bmp-- Dr.B ? ?

## 2021-11-18 ENCOUNTER — Other Ambulatory Visit (HOSPITAL_COMMUNITY): Payer: Self-pay

## 2021-11-22 ENCOUNTER — Other Ambulatory Visit (HOSPITAL_COMMUNITY): Payer: Self-pay

## 2021-11-30 ENCOUNTER — Other Ambulatory Visit (HOSPITAL_COMMUNITY): Payer: Self-pay

## 2021-11-30 DIAGNOSIS — G4700 Insomnia, unspecified: Secondary | ICD-10-CM | POA: Diagnosis not present

## 2021-11-30 DIAGNOSIS — G894 Chronic pain syndrome: Secondary | ICD-10-CM | POA: Diagnosis not present

## 2021-11-30 DIAGNOSIS — M25569 Pain in unspecified knee: Secondary | ICD-10-CM | POA: Diagnosis not present

## 2021-11-30 DIAGNOSIS — M7552 Bursitis of left shoulder: Secondary | ICD-10-CM | POA: Diagnosis not present

## 2021-11-30 MED ORDER — DULOXETINE HCL 60 MG PO CPEP
ORAL_CAPSULE | ORAL | 1 refills | Status: DC
  Filled 2021-11-30 – 2021-12-17 (×2): qty 30, 30d supply, fill #0
  Filled 2022-01-18: qty 30, 30d supply, fill #1

## 2021-11-30 MED ORDER — BACLOFEN 10 MG PO TABS
ORAL_TABLET | ORAL | 3 refills | Status: DC
  Filled 2021-11-30 – 2021-12-17 (×2): qty 60, 30d supply, fill #0
  Filled 2022-02-14: qty 60, 30d supply, fill #1
  Filled 2022-04-17: qty 60, 30d supply, fill #2

## 2021-11-30 MED ORDER — TRAMADOL HCL 50 MG PO TABS
ORAL_TABLET | ORAL | 1 refills | Status: DC
Start: 2021-11-30 — End: 2022-05-23
  Filled 2021-11-30: qty 240, 30d supply, fill #0

## 2021-11-30 MED ORDER — MELOXICAM 15 MG PO TABS
ORAL_TABLET | ORAL | 1 refills | Status: DC
  Filled 2021-11-30: qty 30, 30d supply, fill #0
  Filled 2022-01-18: qty 30, 30d supply, fill #1

## 2021-12-01 ENCOUNTER — Other Ambulatory Visit (HOSPITAL_COMMUNITY): Payer: Self-pay

## 2021-12-09 DIAGNOSIS — H43812 Vitreous degeneration, left eye: Secondary | ICD-10-CM | POA: Diagnosis not present

## 2021-12-13 ENCOUNTER — Other Ambulatory Visit (HOSPITAL_COMMUNITY): Payer: Self-pay

## 2021-12-17 ENCOUNTER — Other Ambulatory Visit (HOSPITAL_COMMUNITY): Payer: Self-pay

## 2021-12-20 ENCOUNTER — Other Ambulatory Visit (HOSPITAL_COMMUNITY): Payer: Self-pay

## 2021-12-20 ENCOUNTER — Ambulatory Visit (INDEPENDENT_AMBULATORY_CARE_PROVIDER_SITE_OTHER): Payer: 59 | Admitting: Adult Health

## 2021-12-20 ENCOUNTER — Encounter (INDEPENDENT_AMBULATORY_CARE_PROVIDER_SITE_OTHER): Payer: Self-pay | Admitting: Adult Health

## 2021-12-20 VITALS — BP 123/73 | HR 80 | Temp 97.8°F | Ht 61.0 in | Wt 150.0 lb

## 2021-12-20 DIAGNOSIS — E669 Obesity, unspecified: Secondary | ICD-10-CM

## 2021-12-20 DIAGNOSIS — I10 Essential (primary) hypertension: Secondary | ICD-10-CM

## 2021-12-20 DIAGNOSIS — E559 Vitamin D deficiency, unspecified: Secondary | ICD-10-CM

## 2021-12-20 DIAGNOSIS — Z6828 Body mass index (BMI) 28.0-28.9, adult: Secondary | ICD-10-CM | POA: Diagnosis not present

## 2021-12-20 DIAGNOSIS — R7303 Prediabetes: Secondary | ICD-10-CM

## 2021-12-20 DIAGNOSIS — Z9189 Other specified personal risk factors, not elsewhere classified: Secondary | ICD-10-CM

## 2021-12-20 MED ORDER — METFORMIN HCL 500 MG PO TABS
500.0000 mg | ORAL_TABLET | Freq: Two times a day (BID) | ORAL | 0 refills | Status: DC
  Filled 2021-12-20: qty 180, 90d supply, fill #0

## 2021-12-21 LAB — HEMOGLOBIN A1C
Est. average glucose Bld gHb Est-mCnc: 114 mg/dL
Hgb A1c MFr Bld: 5.6 % (ref 4.8–5.6)

## 2021-12-21 LAB — INSULIN, RANDOM: INSULIN: 10 u[IU]/mL (ref 2.6–24.9)

## 2021-12-21 LAB — VITAMIN D 25 HYDROXY (VIT D DEFICIENCY, FRACTURES): Vit D, 25-Hydroxy: 67.1 ng/mL (ref 30.0–100.0)

## 2021-12-21 LAB — VITAMIN B12: Vitamin B-12: 1596 pg/mL — ABNORMAL HIGH (ref 232–1245)

## 2021-12-21 NOTE — Progress Notes (Signed)
Chief Complaint:   OBESITY Katherine Tanner is here to discuss her progress with her obesity treatment plan along with follow-up of her obesity related diagnoses. Katherine Tanner is on the Category 2 Plan and states she is following her eating plan approximately 90% of the time. Katherine Tanner states she is exercising with videos and bike riding for 30 minutes 4 times per week.  Today's visit was #: 61 Starting weight: 198 lbs Starting date: 08/30/2018 Today's weight: 150 lbs Today's date: 12/20/2021 Total lbs lost to date: 53 Total lbs lost since last in-office visit: 1  Interim History:  Katherine Tanner recently celebrated her 30th wedding anniversary at [employer] with her husband.   She enjoyed chocolate cake. She is thrilled to have lost a 1 lb since last OV.  Subjective:   1. Essential hypertension Katherine Tanner's ambulatory readings are systolic blood pressure 638'G, and diastolic blood pressure 66Z.   She is on losartan/HCTZ 100 mg / 25 mg - 1/2 tablet daily.  2. Prediabetes On 11/17/2021 BMP creatinine 0.66, GFR > 60.   She is on metformin 500 mg twice daily with meals, tolerating well.  3. Vitamin D deficiency On 09/28/2021 vitamin D level was 79.5.   She is on OTC vitamin D3 1000 units daily.  4. At risk for complication associated with hypotension The patient is at a higher than average risk of hypotension due to steady weight loss. PCP manages Hyzaar.  Assessment/Plan:   1. Essential hypertension Katherine Tanner will continue 1/2 tablet of losartan/HCTZ 100/25 mg daily.  2. Prediabetes We will check labs today.   We will refill metformin 500 mg twice daily with meals for 90 days, with no refills.  - metFORMIN (GLUCOPHAGE) 500 MG tablet; Take 1 tablet by mouth 2 times daily (with breakfast and with lunch.)  Dispense: 180 tablet; Refill: 0 - Hemoglobin A1c - Insulin, random - Vitamin B12  3. Vitamin D deficiency We will check labs today.  Katherine Tanner will continue OTC vitamin D3 daily.  - VITAMIN D 25  Hydroxy (Vit-D Deficiency, Fractures)  4. At risk for complication associated with hypotension Katherine Tanner was given approximately 15 minutes of education and counseling today to help avoid hypotension. We discussed risks of hypotension with weight loss and signs of hypotension such as feeling lightheaded or unsteady.  Repetitive spaced learning was employed today to elicit superior memory formation and behavioral change.  5. Obesity with current BMI 28.5 Katherine Tanner is currently in the action stage of change. As such, her goal is to continue with weight loss efforts. She has agreed to the Category 2 Plan.   Exercise goals: As is.  Behavioral modification strategies: increasing lean protein intake, decreasing simple carbohydrates, meal planning and cooking strategies, keeping healthy foods in the home, and planning for success.  Katherine Tanner has agreed to follow-up with our clinic in 6 weeks. She was informed of the importance of frequent follow-up visits to maximize her success with intensive lifestyle modifications for her multiple health conditions.   Katherine Tanner was informed we would discuss her lab results at her next visit unless there is a critical issue that needs to be addressed sooner. Katherine Tanner agreed to keep her next visit at the agreed upon time to discuss these results.  Objective:   Blood pressure 123/73, pulse 80, temperature 97.8 F (36.6 C), height '5\' 1"'$  (1.549 m), weight 150 lb (68 kg), last menstrual period 02/20/2014, SpO2 100 %. Body mass index is 28.34 kg/m.  General: Cooperative, alert, well developed, in no acute distress.  HEENT: Conjunctivae and lids unremarkable. Cardiovascular: Regular rhythm.  Lungs: Normal work of breathing. Neurologic: No focal deficits.   Lab Results  Component Value Date   CREATININE 0.66 11/17/2021   BUN 28 (H) 11/17/2021   NA 140 11/17/2021   K 4.4 11/17/2021   CL 101 11/17/2021   CO2 31 11/17/2021   Lab Results  Component Value Date   ALT 27  09/28/2021   AST 24 09/28/2021   ALKPHOS 115 09/28/2021   BILITOT 0.5 09/28/2021   Lab Results  Component Value Date   HGBA1C 5.6 12/20/2021   HGBA1C 5.6 09/28/2021   HGBA1C 5.8 07/09/2021   HGBA1C 5.6 06/24/2021   HGBA1C 5.8 (H) 03/22/2021   Lab Results  Component Value Date   INSULIN WILL FOLLOW 12/20/2021   INSULIN 3.7 09/28/2021   INSULIN 7.0 06/24/2021   INSULIN 6.9 03/22/2021   INSULIN 6.0 10/12/2020   Lab Results  Component Value Date   TSH 0.91 07/09/2021   Lab Results  Component Value Date   CHOL 146 07/09/2021   HDL 61.70 07/09/2021   LDLCALC 77 07/09/2021   LDLDIRECT 147.9 12/17/2012   TRIG 35.0 07/09/2021   CHOLHDL 2 07/09/2021   Lab Results  Component Value Date   VD25OH 67.1 12/20/2021   VD25OH 79.5 09/28/2021   VD25OH 54.23 07/09/2021   Lab Results  Component Value Date   WBC 5.6 11/17/2021   HGB 12.2 11/17/2021   HCT 37.4 11/17/2021   MCV 89.5 11/17/2021   PLT 86 (L) 11/17/2021   Lab Results  Component Value Date   IRON 63 07/07/2020   Attestation Statements:   Reviewed by clinician on day of visit: allergies, medications, problem list, medical history, surgical history, family history, social history, and previous encounter notes.   Wilhemena Durie, am acting as transcriptionist for Mina Marble, NP.  I have reviewed the above documentation for accuracy and completeness, and I agree with the above. -  Manpreet Strey d. Shandrea Lusk, NP-C

## 2022-01-10 DIAGNOSIS — H43812 Vitreous degeneration, left eye: Secondary | ICD-10-CM | POA: Diagnosis not present

## 2022-01-17 ENCOUNTER — Inpatient Hospital Stay: Payer: 59 | Attending: Internal Medicine

## 2022-01-17 DIAGNOSIS — D696 Thrombocytopenia, unspecified: Secondary | ICD-10-CM | POA: Diagnosis not present

## 2022-01-17 LAB — CBC WITH DIFFERENTIAL/PLATELET
Abs Immature Granulocytes: 0.02 10*3/uL (ref 0.00–0.07)
Basophils Absolute: 0 10*3/uL (ref 0.0–0.1)
Basophils Relative: 1 %
Eosinophils Absolute: 0.1 10*3/uL (ref 0.0–0.5)
Eosinophils Relative: 2 %
HCT: 40.2 % (ref 36.0–46.0)
Hemoglobin: 13 g/dL (ref 12.0–15.0)
Immature Granulocytes: 0 %
Lymphocytes Relative: 39 %
Lymphs Abs: 2.4 10*3/uL (ref 0.7–4.0)
MCH: 28.8 pg (ref 26.0–34.0)
MCHC: 32.3 g/dL (ref 30.0–36.0)
MCV: 89.1 fL (ref 80.0–100.0)
Monocytes Absolute: 0.6 10*3/uL (ref 0.1–1.0)
Monocytes Relative: 10 %
Neutro Abs: 3 10*3/uL (ref 1.7–7.7)
Neutrophils Relative %: 48 %
Platelets: 89 10*3/uL — ABNORMAL LOW (ref 150–400)
RBC: 4.51 MIL/uL (ref 3.87–5.11)
RDW: 13.9 % (ref 11.5–15.5)
WBC: 6.1 10*3/uL (ref 4.0–10.5)
nRBC: 0 % (ref 0.0–0.2)

## 2022-01-18 ENCOUNTER — Other Ambulatory Visit (HOSPITAL_COMMUNITY): Payer: Self-pay

## 2022-01-19 ENCOUNTER — Other Ambulatory Visit (HOSPITAL_COMMUNITY): Payer: Self-pay

## 2022-01-25 ENCOUNTER — Other Ambulatory Visit (HOSPITAL_COMMUNITY): Payer: Self-pay

## 2022-01-25 DIAGNOSIS — G894 Chronic pain syndrome: Secondary | ICD-10-CM | POA: Diagnosis not present

## 2022-01-25 DIAGNOSIS — Z79891 Long term (current) use of opiate analgesic: Secondary | ICD-10-CM | POA: Diagnosis not present

## 2022-01-25 DIAGNOSIS — M7552 Bursitis of left shoulder: Secondary | ICD-10-CM | POA: Diagnosis not present

## 2022-01-25 DIAGNOSIS — G4700 Insomnia, unspecified: Secondary | ICD-10-CM | POA: Diagnosis not present

## 2022-01-25 DIAGNOSIS — M25569 Pain in unspecified knee: Secondary | ICD-10-CM | POA: Diagnosis not present

## 2022-01-25 MED ORDER — DULOXETINE HCL 60 MG PO CPEP
ORAL_CAPSULE | ORAL | 1 refills | Status: DC
  Filled 2022-01-25 – 2022-02-14 (×2): qty 30, 30d supply, fill #0
  Filled 2022-04-17: qty 30, 30d supply, fill #1

## 2022-01-25 MED ORDER — TRAMADOL HCL 50 MG PO TABS
ORAL_TABLET | ORAL | 1 refills | Status: DC
  Filled 2022-01-25: qty 240, 30d supply, fill #0

## 2022-01-25 MED ORDER — MELOXICAM 15 MG PO TABS
ORAL_TABLET | ORAL | 1 refills | Status: DC
  Filled 2022-01-25 – 2022-04-17 (×2): qty 30, 30d supply, fill #0
  Filled 2022-05-10 – 2022-05-12 (×2): qty 30, 30d supply, fill #1

## 2022-01-31 ENCOUNTER — Encounter (INDEPENDENT_AMBULATORY_CARE_PROVIDER_SITE_OTHER): Payer: Self-pay | Admitting: Adult Health

## 2022-01-31 ENCOUNTER — Ambulatory Visit (INDEPENDENT_AMBULATORY_CARE_PROVIDER_SITE_OTHER): Payer: 59 | Admitting: Adult Health

## 2022-01-31 VITALS — BP 130/78 | HR 84 | Temp 98.2°F | Ht 61.0 in | Wt 150.0 lb

## 2022-01-31 DIAGNOSIS — Z6828 Body mass index (BMI) 28.0-28.9, adult: Secondary | ICD-10-CM

## 2022-01-31 DIAGNOSIS — G894 Chronic pain syndrome: Secondary | ICD-10-CM

## 2022-01-31 DIAGNOSIS — E669 Obesity, unspecified: Secondary | ICD-10-CM

## 2022-02-07 NOTE — Progress Notes (Unsigned)
Chief Complaint:   OBESITY Katherine Tanner is here to discuss her progress with her obesity treatment plan along with follow-up of her obesity related diagnoses. Katherine Tanner is on the Category 2 Plan and states she is following her eating plan approximately 100% of the time. Katherine Tanner states she is riding her bike, walking on the treadmill, and lifting weights for 30 minutes 4 times per week.  Today's visit was #: 61 Starting weight: 198 lbs Starting date: 08/30/2018 Today's weight: 150 lbs Today's date: 01/31/2022 Total lbs lost to date: 48 Total lbs lost since last in-office visit: 0  Interim History: I reviewed the bioempedence with the patient. She is very successful in the maintenance phase. Katherine Tanner has started using the hospital gym. She has increased her accountability with regular exercise.  Subjective:   1. Chronic pain syndrome Katherine Tanner was provided an Ultram prescription. Dr Ritta Slot is attempting to obtain approval for injection therapy. PDMP was reviewed and no observance was noted.  Assessment/Plan:   1. Chronic pain syndrome Katherine Tanner agrees to follow up with Dr Ritta Slot and will follow up at the agrees upon time.  2. Obesity with current BMI 28.4 Katherine Tanner currently works 4 to 9 hour shifts per week and is off every Monday.  Katherine Tanner is currently in the action stage of change. As such, her goal is to maintain weight loss for now She has agreed to the Category 2 Plan.   Exercise goals:  As is.  Behavioral modification strategies: increasing lean protein intake, decreasing simple carbohydrates, meal planning and cooking strategies, keeping healthy foods in the home, and planning for success.  Katherine Tanner has agreed to follow-up with our clinic in 8 weeks. She was informed of the importance of frequent follow-up visits to maximize her success with intensive lifestyle modifications for her multiple health conditions.   Objective:   Blood pressure 130/78, pulse 84, temperature 98.2 F (36.8 C),  height '5\' 1"'$  (1.549 m), weight 150 lb (68 kg), last menstrual period 02/20/2014, SpO2 100 %. Body mass index is 28.34 kg/m.  General: Cooperative, alert, well developed, in no acute distress. HEENT: Conjunctivae and lids unremarkable. Cardiovascular: Regular rhythm.  Lungs: Normal work of breathing. Neurologic: No focal deficits.   Lab Results  Component Value Date   CREATININE 0.66 11/17/2021   BUN 28 (H) 11/17/2021   NA 140 11/17/2021   K 4.4 11/17/2021   CL 101 11/17/2021   CO2 31 11/17/2021   Lab Results  Component Value Date   ALT 27 09/28/2021   AST 24 09/28/2021   ALKPHOS 115 09/28/2021   BILITOT 0.5 09/28/2021   Lab Results  Component Value Date   HGBA1C 5.6 12/20/2021   HGBA1C 5.6 09/28/2021   HGBA1C 5.8 07/09/2021   HGBA1C 5.6 06/24/2021   HGBA1C 5.8 (H) 03/22/2021   Lab Results  Component Value Date   INSULIN 10.0 12/20/2021   INSULIN 3.7 09/28/2021   INSULIN 7.0 06/24/2021   INSULIN 6.9 03/22/2021   INSULIN 6.0 10/12/2020   Lab Results  Component Value Date   TSH 0.91 07/09/2021   Lab Results  Component Value Date   CHOL 146 07/09/2021   HDL 61.70 07/09/2021   LDLCALC 77 07/09/2021   LDLDIRECT 147.9 12/17/2012   TRIG 35.0 07/09/2021   CHOLHDL 2 07/09/2021   Lab Results  Component Value Date   VD25OH 67.1 12/20/2021   VD25OH 79.5 09/28/2021   VD25OH 54.23 07/09/2021   Lab Results  Component Value Date   WBC 6.1 01/17/2022  HGB 13.0 01/17/2022   HCT 40.2 01/17/2022   MCV 89.1 01/17/2022   PLT 89 (L) 01/17/2022   Lab Results  Component Value Date   IRON 63 07/07/2020   Attestation Statements:   Reviewed by clinician on day of visit: allergies, medications, problem list, medical history, surgical history, family history, social history, and previous encounter notes.  Time spent on visit including pre-visit chart review and post-visit care and charting was 28 minutes.   I, Marcille Blanco, am acting as Location manager for Mina Marble, NP  I have reviewed the above documentation for accuracy and completeness, and I agree with the above. -  ***

## 2022-02-09 ENCOUNTER — Encounter (INDEPENDENT_AMBULATORY_CARE_PROVIDER_SITE_OTHER): Payer: Self-pay

## 2022-02-09 DIAGNOSIS — G894 Chronic pain syndrome: Secondary | ICD-10-CM | POA: Insufficient documentation

## 2022-02-14 ENCOUNTER — Other Ambulatory Visit (HOSPITAL_COMMUNITY): Payer: Self-pay

## 2022-02-22 DIAGNOSIS — M47816 Spondylosis without myelopathy or radiculopathy, lumbar region: Secondary | ICD-10-CM | POA: Diagnosis not present

## 2022-03-02 ENCOUNTER — Other Ambulatory Visit: Payer: Self-pay | Admitting: Physical Medicine and Rehabilitation

## 2022-03-02 DIAGNOSIS — M47816 Spondylosis without myelopathy or radiculopathy, lumbar region: Secondary | ICD-10-CM

## 2022-03-06 ENCOUNTER — Ambulatory Visit
Admission: RE | Admit: 2022-03-06 | Discharge: 2022-03-06 | Disposition: A | Discharge status: Home / Self Care | Payer: 59 | Source: Ambulatory Visit | Attending: Physical Medicine and Rehabilitation | Admitting: Physical Medicine and Rehabilitation

## 2022-03-06 DIAGNOSIS — M47817 Spondylosis without myelopathy or radiculopathy, lumbosacral region: Secondary | ICD-10-CM | POA: Diagnosis not present

## 2022-03-06 DIAGNOSIS — M5127 Other intervertebral disc displacement, lumbosacral region: Secondary | ICD-10-CM | POA: Diagnosis not present

## 2022-03-06 DIAGNOSIS — M47816 Spondylosis without myelopathy or radiculopathy, lumbar region: Secondary | ICD-10-CM

## 2022-03-14 ENCOUNTER — Telehealth: Payer: Self-pay | Admitting: Family Medicine

## 2022-03-14 NOTE — Telephone Encounter (Signed)
I reviewed her recent MRI report  IMPRESSION: Mild, facet predominant degenerative changes as detailed above are similar to the prior study. No significant stenosis.   Possible fibroid uterus. This could be further evaluated with ultrasound.  Not sure if she knew she had fibriods or not in uterus  Please send a copy to her gyn and let her know

## 2022-03-15 ENCOUNTER — Other Ambulatory Visit (HOSPITAL_COMMUNITY): Payer: Self-pay

## 2022-03-15 DIAGNOSIS — M25569 Pain in unspecified knee: Secondary | ICD-10-CM | POA: Diagnosis not present

## 2022-03-15 DIAGNOSIS — G4700 Insomnia, unspecified: Secondary | ICD-10-CM | POA: Diagnosis not present

## 2022-03-15 DIAGNOSIS — G894 Chronic pain syndrome: Secondary | ICD-10-CM | POA: Diagnosis not present

## 2022-03-15 DIAGNOSIS — M7552 Bursitis of left shoulder: Secondary | ICD-10-CM | POA: Diagnosis not present

## 2022-03-15 MED ORDER — TRAMADOL HCL 50 MG PO TABS
50.0000 mg | ORAL_TABLET | Freq: Four times a day (QID) | ORAL | 1 refills | Status: DC | PRN
  Filled 2022-03-15: qty 240, 30d supply, fill #0

## 2022-03-15 MED ORDER — BACLOFEN 10 MG PO TABS
10.0000 mg | ORAL_TABLET | Freq: Two times a day (BID) | ORAL | 3 refills | Status: DC | PRN
  Filled 2022-03-15: qty 60, 30d supply, fill #0
  Filled 2022-05-10 – 2022-05-12 (×2): qty 60, 30d supply, fill #1
  Filled 2022-06-13: qty 60, 30d supply, fill #2

## 2022-03-15 MED ORDER — DULOXETINE HCL 60 MG PO CPEP
60.0000 mg | ORAL_CAPSULE | Freq: Every evening | ORAL | 1 refills | Status: DC
  Filled 2022-03-15: qty 30, 30d supply, fill #0
  Filled 2022-05-10: qty 30, 30d supply, fill #1

## 2022-03-15 MED ORDER — MELOXICAM 15 MG PO TABS
15.0000 mg | ORAL_TABLET | Freq: Every day | ORAL | 1 refills | Status: DC | PRN
  Filled 2022-03-15: qty 30, 30d supply, fill #0

## 2022-03-15 NOTE — Telephone Encounter (Signed)
Left VM requesting pt to call the office back 

## 2022-03-15 NOTE — Telephone Encounter (Signed)
Pt notified of Dr. Marliss Coots comments and copy of MRI sent to GYN

## 2022-03-15 NOTE — Telephone Encounter (Signed)
Patient called back in returning a call she missed. She stated she has another appointment and will call back afterwards.

## 2022-03-16 ENCOUNTER — Other Ambulatory Visit (HOSPITAL_COMMUNITY): Payer: Self-pay

## 2022-03-20 ENCOUNTER — Other Ambulatory Visit (HOSPITAL_COMMUNITY): Payer: Self-pay

## 2022-03-21 ENCOUNTER — Inpatient Hospital Stay: Payer: 59 | Attending: Internal Medicine

## 2022-03-21 ENCOUNTER — Other Ambulatory Visit (HOSPITAL_COMMUNITY): Payer: Self-pay

## 2022-03-21 DIAGNOSIS — D696 Thrombocytopenia, unspecified: Secondary | ICD-10-CM | POA: Insufficient documentation

## 2022-03-21 LAB — CBC WITH DIFFERENTIAL/PLATELET
Abs Immature Granulocytes: 0 10*3/uL (ref 0.00–0.07)
Basophils Absolute: 0.1 10*3/uL (ref 0.0–0.1)
Basophils Relative: 1 %
Eosinophils Absolute: 0.3 10*3/uL (ref 0.0–0.5)
Eosinophils Relative: 4 %
HCT: 39.4 % (ref 36.0–46.0)
Hemoglobin: 12.9 g/dL (ref 12.0–15.0)
Immature Granulocytes: 0 %
Lymphocytes Relative: 38 %
Lymphs Abs: 2.4 10*3/uL (ref 0.7–4.0)
MCH: 29.5 pg (ref 26.0–34.0)
MCHC: 32.7 g/dL (ref 30.0–36.0)
MCV: 90 fL (ref 80.0–100.0)
Monocytes Absolute: 0.6 10*3/uL (ref 0.1–1.0)
Monocytes Relative: 10 %
Neutro Abs: 2.9 10*3/uL (ref 1.7–7.7)
Neutrophils Relative %: 47 %
Platelets: 85 10*3/uL — ABNORMAL LOW (ref 150–400)
RBC: 4.38 MIL/uL (ref 3.87–5.11)
RDW: 13.7 % (ref 11.5–15.5)
WBC: 6.3 10*3/uL (ref 4.0–10.5)
nRBC: 0 % (ref 0.0–0.2)

## 2022-03-28 ENCOUNTER — Encounter (INDEPENDENT_AMBULATORY_CARE_PROVIDER_SITE_OTHER): Payer: Self-pay | Admitting: Adult Health

## 2022-03-28 ENCOUNTER — Other Ambulatory Visit (HOSPITAL_COMMUNITY): Payer: Self-pay

## 2022-03-28 ENCOUNTER — Other Ambulatory Visit: Payer: Self-pay | Admitting: Family Medicine

## 2022-03-28 ENCOUNTER — Ambulatory Visit (INDEPENDENT_AMBULATORY_CARE_PROVIDER_SITE_OTHER): Payer: 59 | Admitting: Adult Health

## 2022-03-28 VITALS — BP 145/78 | HR 72 | Temp 98.0°F | Ht 61.0 in | Wt 152.0 lb

## 2022-03-28 DIAGNOSIS — E559 Vitamin D deficiency, unspecified: Secondary | ICD-10-CM

## 2022-03-28 DIAGNOSIS — I10 Essential (primary) hypertension: Secondary | ICD-10-CM

## 2022-03-28 DIAGNOSIS — E669 Obesity, unspecified: Secondary | ICD-10-CM | POA: Diagnosis not present

## 2022-03-28 DIAGNOSIS — Z6828 Body mass index (BMI) 28.0-28.9, adult: Secondary | ICD-10-CM

## 2022-03-28 DIAGNOSIS — Z1231 Encounter for screening mammogram for malignant neoplasm of breast: Secondary | ICD-10-CM

## 2022-03-28 DIAGNOSIS — G894 Chronic pain syndrome: Secondary | ICD-10-CM

## 2022-03-28 DIAGNOSIS — R7303 Prediabetes: Secondary | ICD-10-CM

## 2022-03-28 MED ORDER — METFORMIN HCL 500 MG PO TABS
500.0000 mg | ORAL_TABLET | Freq: Two times a day (BID) | ORAL | 0 refills | Status: DC
  Filled 2022-03-28: qty 180, 90d supply, fill #0

## 2022-03-28 NOTE — Progress Notes (Unsigned)
Chief Complaint:   OBESITY Katherine Tanner is here to discuss her progress with her obesity treatment plan along with follow-up of her obesity related diagnoses. Katherine Tanner is on the Category 2 Plan and states she is following her eating plan approximately 100% of the time. Katherine Tanner states she is stretching and using the treadmill 15 minutes 2 times per week.  Today's visit was #: 54 Starting weight: 198 lbs Starting date: 08/30/2018 Today's weight: 152 lbs Today's date: 03/28/2022 Total lbs lost to date: 46 lbs Total lbs lost since last in-office visit: 456 lbs  Interim History: She endorses exacerbation of back pain which accounts for increased blood pressure.  Pain specialist recently ordered MRI and referral to outpatient PT.  She is using Tramadol for pain control plus OTC acetaminophen 500 mg BID.  Subjective:   1. Prediabetes Metformin 500 mg BID at breakfast and lunch, denies GI upset.    2. Essential hypertension Blood pressure above goal.  She denies acute cardiac symptoms at present.  She endorses increased acute back pain 8/10, described as throbbing and aching lumbar with radiation right sciatica.   3. Chronic pain syndrome 03/06/2022, MRI lumbar spine without contrast ***.  Dr Greta Doom referraly to out patient PT, yet to start.    4. Vitamin D deficiency She is using OTC Vitamin D-3 1, 000 IU daily.   Assessment/Plan:   1. Prediabetes Refill - metFORMIN (GLUCOPHAGE) 500 MG tablet; Take 1 tablet by mouth 2 times daily (with breakfast and with lunch.)  Dispense: 180 tablet; Refill: 0  Check labs   - Hemoglobin A1c - Insulin, random  2. Essential hypertension When increased pain, take full tablet of Hyzaar 100/25 mg. Check labs.  - Comprehensive metabolic panel  3. Chronic pain syndrome Continue home stretching. Start PT.   4. Vitamin D deficiency Check labs.  - VITAMIN D 25 Hydroxy (Vit-D Deficiency, Fractures)  5. Obesity with current BMI 28.7 Katherine Tanner is  currently in the action stage of change. As such, her goal is to continue with weight loss efforts. She has agreed to the Category 2 Plan.   Exercise goals:  As is  Behavioral modification strategies: increasing lean protein intake, decreasing simple carbohydrates, meal planning and cooking strategies, keeping healthy foods in the home, and planning for success.  Katherine Tanner has agreed to follow-up with our clinic in 8 weeks. She was informed of the importance of frequent follow-up visits to maximize her success with intensive lifestyle modifications for her multiple health conditions.   Katherine Tanner was informed we would discuss her lab results at her next visit unless there is a critical issue that needs to be addressed sooner. Katherine Tanner agreed to keep her next visit at the agreed upon time to discuss these results.  Objective:   Blood pressure (!) 145/78, pulse 72, temperature 98 F (36.7 C), height '5\' 1"'$  (1.549 m), weight 152 lb (68.9 kg), last menstrual period 02/20/2014, SpO2 98 %. Body mass index is 28.72 kg/m.  General: Cooperative, alert, well developed, in no acute distress. HEENT: Conjunctivae and lids unremarkable. Cardiovascular: Regular rhythm.  Lungs: Normal work of breathing. Neurologic: No focal deficits.   Lab Results  Component Value Date   CREATININE 0.66 11/17/2021   BUN 28 (H) 11/17/2021   NA 140 11/17/2021   K 4.4 11/17/2021   CL 101 11/17/2021   CO2 31 11/17/2021   Lab Results  Component Value Date   ALT 27 09/28/2021   AST 24 09/28/2021   ALKPHOS 115 09/28/2021  BILITOT 0.5 09/28/2021   Lab Results  Component Value Date   HGBA1C 5.6 12/20/2021   HGBA1C 5.6 09/28/2021   HGBA1C 5.8 07/09/2021   HGBA1C 5.6 06/24/2021   HGBA1C 5.8 (H) 03/22/2021   Lab Results  Component Value Date   INSULIN 10.0 12/20/2021   INSULIN 3.7 09/28/2021   INSULIN 7.0 06/24/2021   INSULIN 6.9 03/22/2021   INSULIN 6.0 10/12/2020   Lab Results  Component Value Date   TSH 0.91  07/09/2021   Lab Results  Component Value Date   CHOL 146 07/09/2021   HDL 61.70 07/09/2021   LDLCALC 77 07/09/2021   LDLDIRECT 147.9 12/17/2012   TRIG 35.0 07/09/2021   CHOLHDL 2 07/09/2021   Lab Results  Component Value Date   VD25OH 67.1 12/20/2021   VD25OH 79.5 09/28/2021   VD25OH 54.23 07/09/2021   Lab Results  Component Value Date   WBC 6.3 03/21/2022   HGB 12.9 03/21/2022   HCT 39.4 03/21/2022   MCV 90.0 03/21/2022   PLT 85 (L) 03/21/2022   Lab Results  Component Value Date   IRON 63 07/07/2020   Attestation Statements:   Reviewed by clinician on day of visit: allergies, medications, problem list, medical history, surgical history, family history, social history, and previous encounter notes.  I, Davy Pique, RMA, am acting as Location manager for Mina Marble, NP.  I have reviewed the above documentation for accuracy and completeness, and I agree with the above. -  ***

## 2022-03-29 ENCOUNTER — Other Ambulatory Visit (HOSPITAL_COMMUNITY): Payer: Self-pay

## 2022-03-29 ENCOUNTER — Encounter (INDEPENDENT_AMBULATORY_CARE_PROVIDER_SITE_OTHER): Payer: Self-pay | Admitting: Adult Health

## 2022-03-29 LAB — COMPREHENSIVE METABOLIC PANEL
ALT: 38 IU/L — ABNORMAL HIGH (ref 0–32)
AST: 26 IU/L (ref 0–40)
Albumin/Globulin Ratio: 1.8 (ref 1.2–2.2)
Albumin: 4.6 g/dL (ref 3.8–4.9)
Alkaline Phosphatase: 131 IU/L — ABNORMAL HIGH (ref 44–121)
BUN/Creatinine Ratio: 25 — ABNORMAL HIGH (ref 9–23)
BUN: 13 mg/dL (ref 6–24)
Bilirubin Total: 0.3 mg/dL (ref 0.0–1.2)
CO2: 26 mmol/L (ref 20–29)
Calcium: 9.9 mg/dL (ref 8.7–10.2)
Chloride: 101 mmol/L (ref 96–106)
Creatinine, Ser: 0.51 mg/dL — ABNORMAL LOW (ref 0.57–1.00)
Globulin, Total: 2.5 g/dL (ref 1.5–4.5)
Glucose: 93 mg/dL (ref 70–99)
Potassium: 4.1 mmol/L (ref 3.5–5.2)
Sodium: 145 mmol/L — ABNORMAL HIGH (ref 134–144)
Total Protein: 7.1 g/dL (ref 6.0–8.5)
eGFR: 107 mL/min/{1.73_m2} (ref >59)

## 2022-03-29 LAB — HEMOGLOBIN A1C
Est. average glucose Bld gHb Est-mCnc: 114 mg/dL
Hgb A1c MFr Bld: 5.6 % (ref 4.8–5.6)

## 2022-03-29 LAB — VITAMIN D 25 HYDROXY (VIT D DEFICIENCY, FRACTURES): Vit D, 25-Hydroxy: 45.1 ng/mL (ref 30.0–100.0)

## 2022-03-29 LAB — INSULIN, RANDOM: INSULIN: 6.7 u[IU]/mL (ref 2.6–24.9)

## 2022-04-04 ENCOUNTER — Other Ambulatory Visit (HOSPITAL_COMMUNITY): Payer: Self-pay

## 2022-04-04 MED ORDER — HYDROXYZINE PAMOATE 50 MG PO CAPS
50.0000 mg | ORAL_CAPSULE | Freq: Every evening | ORAL | 1 refills | Status: DC
  Filled 2022-04-04: qty 30, 30d supply, fill #0

## 2022-04-06 IMAGING — US US ABDOMEN COMPLETE
1 series · 13 of 25 positions shown · non-contrast
Comparison: Chest CT dated 07/07/2017.

CLINICAL DATA: Thrombocytopenia.

EXAM:
ABDOMEN ULTRASOUND COMPLETE

[Series 1: us abdomen complete · 0.19mm/px · 13 of 112 slices shown]
[im 1/112]
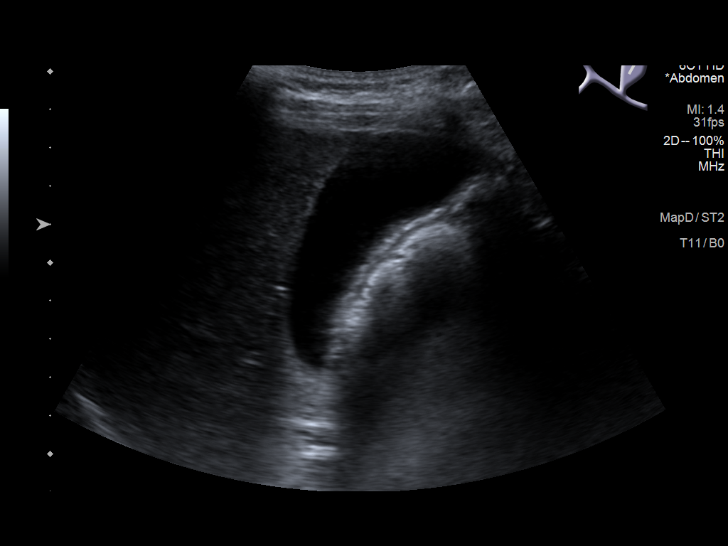
[im 10/112]
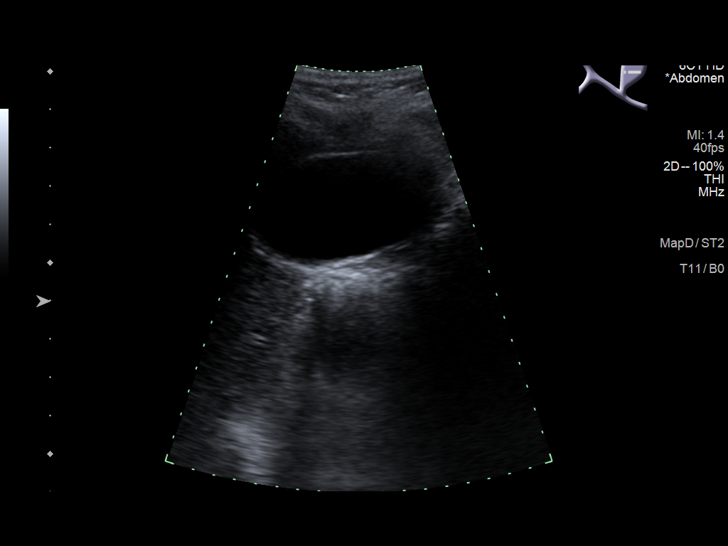
[im 19/112]
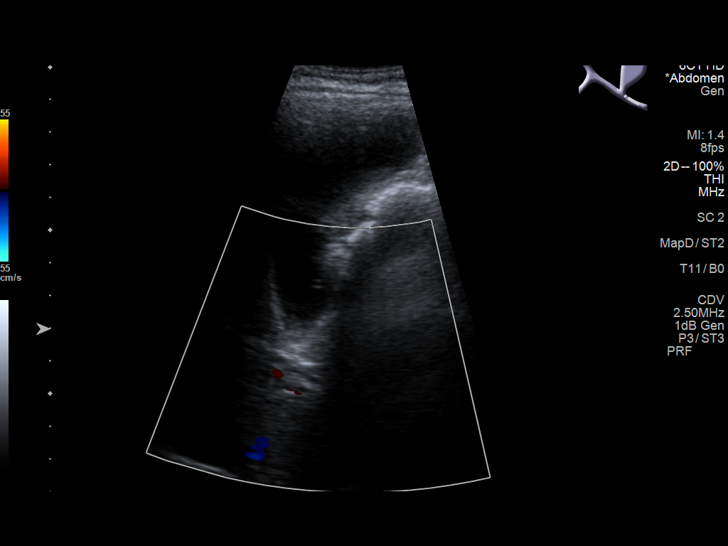
[im 28/112]
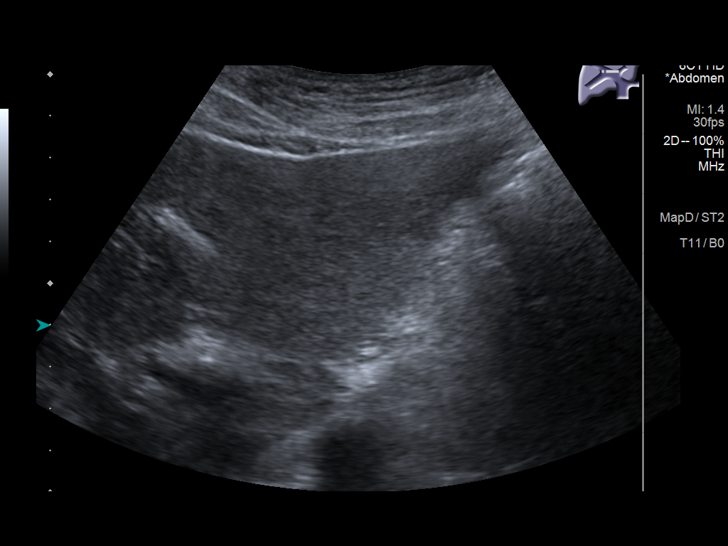
[im 38/112]
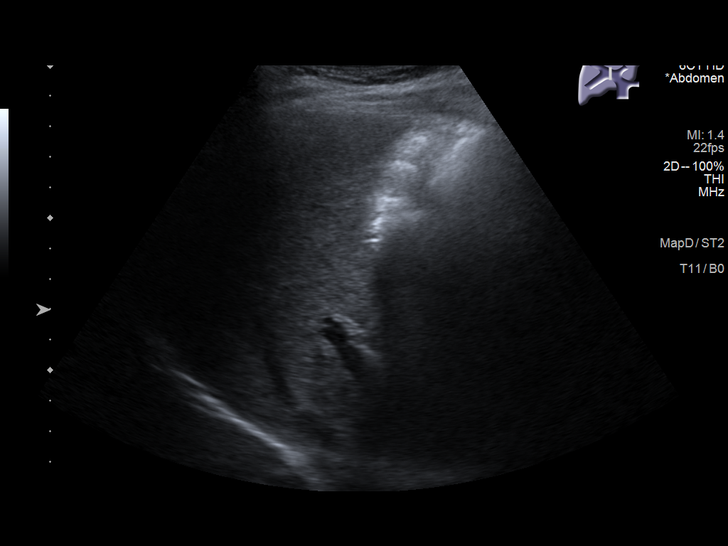
[im 47/112]
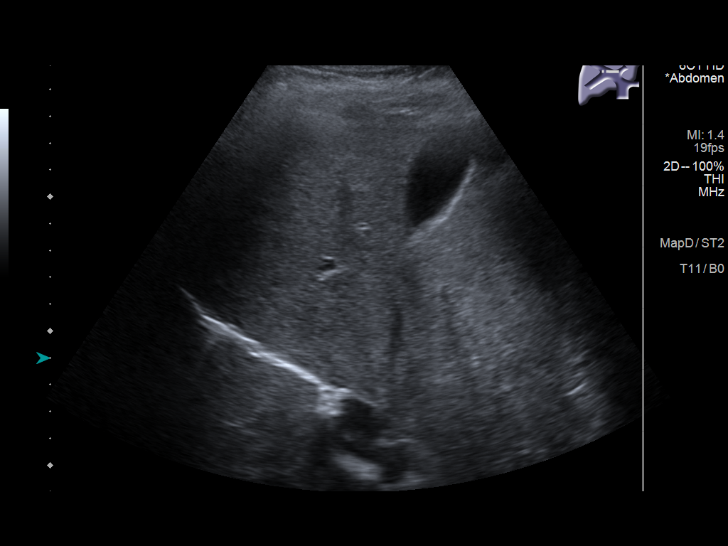
[im 56/112]
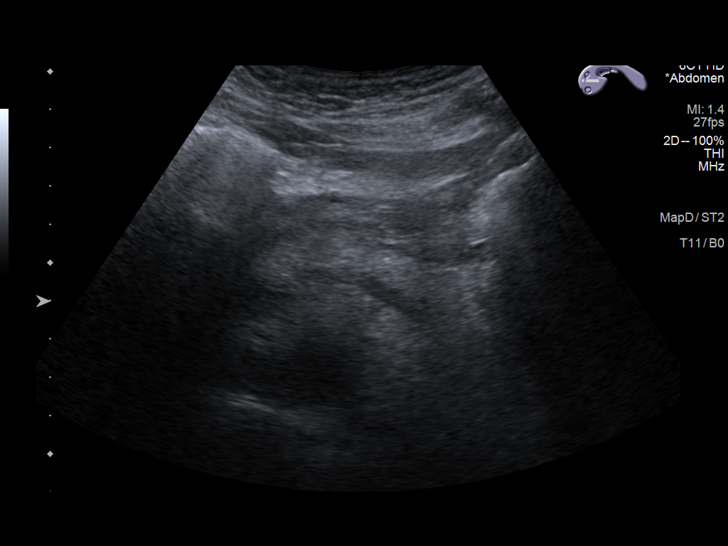
[im 65/112]
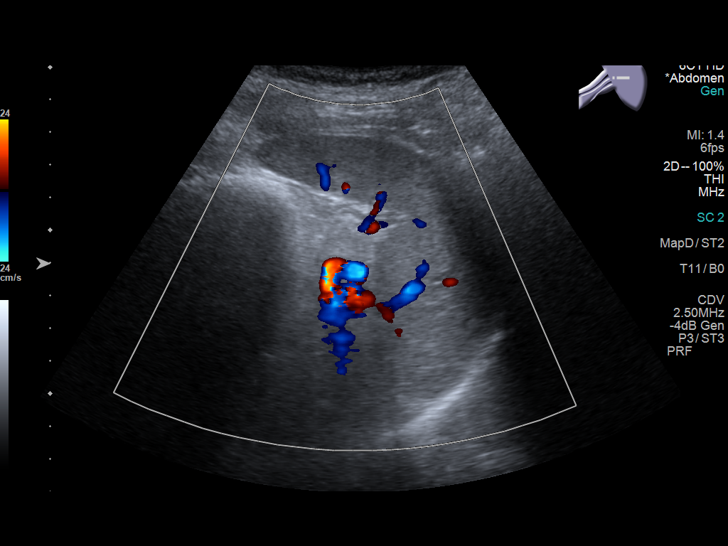
[im 75/112]
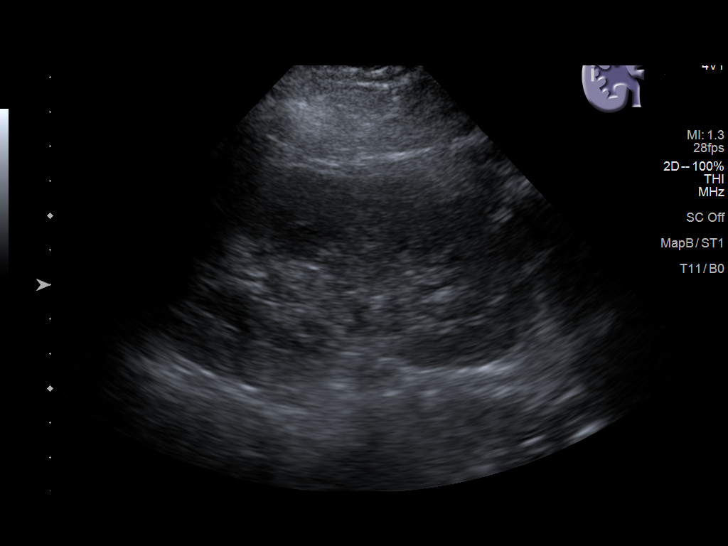
[im 84/112]
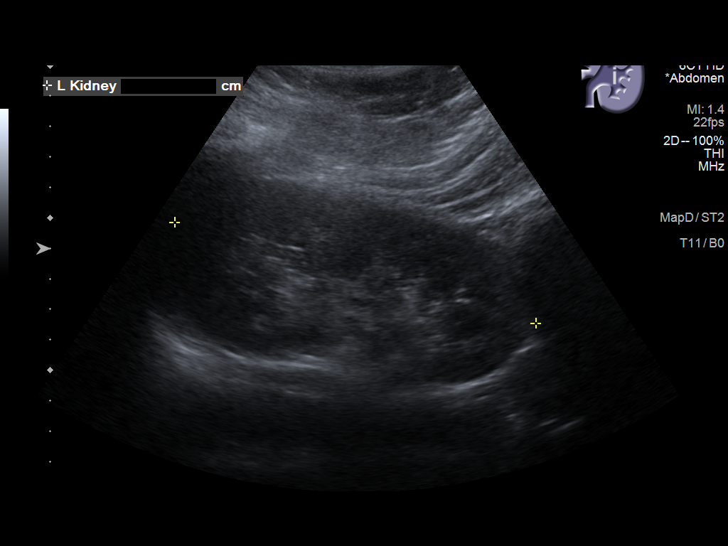
[im 93/112]
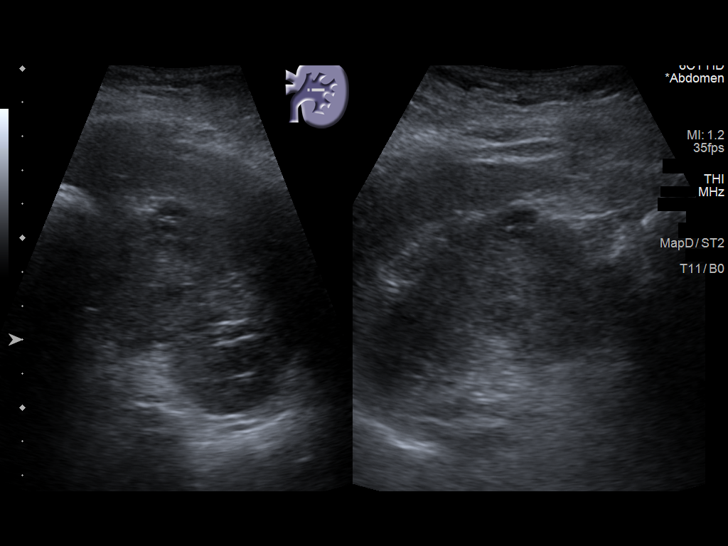
[im 102/112]
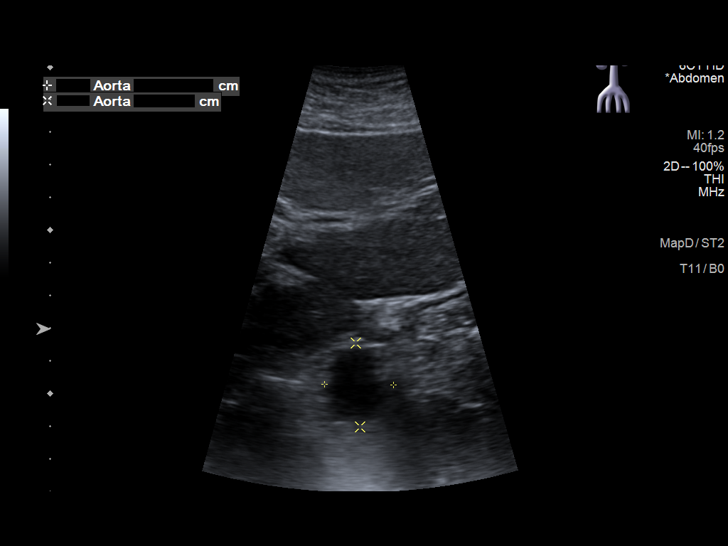
[im 112/112]
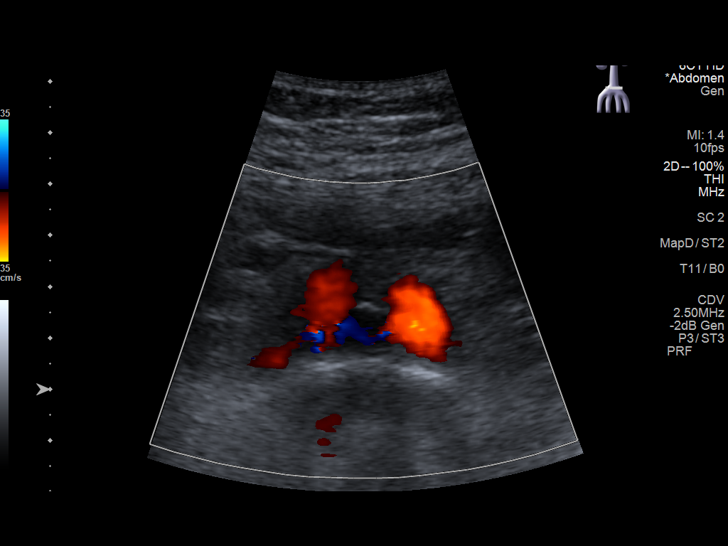

[13 of 25 positions shown; findings below may reference images not displayed]

FINDINGS: Gallbladder: No gallstones or wall thickening visualized. No
sonographic Murphy sign noted by sonographer.

Common bile duct: Diameter: 3 mm

Liver: No focal lesion identified. Within normal limits in
parenchymal echogenicity. Portal vein is patent on color Doppler
imaging with normal direction of blood flow towards the liver.

IVC: No abnormality visualized.

Pancreas: Visualized portion unremarkable.

Spleen: Size and appearance within normal limits.

Right Kidney: Length: 11.4 cm. Normal echogenicity. No
hydronephrosis or shadowing stone. There is a 7 mm hypoechoic
structure in the interpolar right kidney which is suboptimally
characterized due to small size, likely a cyst.

Left Kidney: Length: 12.3 cm. Normal echogenicity. No hydronephrosis
or shadowing stone. There is an 8 mm hypoechoic structure in the
interpolar left kidney which is suboptimally characterized due to
small size, likely a cyst.

Abdominal aorta: No aneurysm visualized.

Other findings: None.
IMPRESSION: Subcentimeter bilateral renal probable cysts, otherwise unremarkable
abdominal ultrasound.

## 2022-04-08 NOTE — Therapy (Signed)
OUTPATIENT PHYSICAL THERAPY THORACOLUMBAR EVALUATION   Patient Name: Katherine Tanner MRN: [id number] DOB:Nov 16, 1960, 61 y.o., female Today's Date: 04/11/2022   PT End of Session - 04/11/22 0928     Visit Number 1    Number of Visits 17    Date for PT Re-Evaluation 06/06/22    Authorization Type [employer] employee    Authorization Time Period auth required after 25th visit    Authorization - Visit Number 1    Authorization - Number of Visits 25    Progress Note Due on Visit 10    PT Start Time [phone]    PT Stop Time 1010    PT Time Calculation (min) 39 min    Activity Tolerance Patient tolerated treatment well    Behavior During Therapy WFL for tasks assessed/performed             Past Medical History:  Diagnosis Date   Allergy    takes Claritin daily and Flonase daily   Arthritis    Asthma    uses Albuterol daily as needed;Singulair nightly;DUlera daily   Back pain    Constipation    Fatty liver    Food allergy    GERD (gastroesophageal reflux disease)    occasionally will take OTC meds but states she just watches what she eats   H/O hiatal hernia    History of bronchitis    last time 32yr ago   History of colon polyps    History of kidney stones    History of shingles    Hyperlipidemia    Hypertension    takes Hyzaar daily   Joint pain    Joint pain    Joint swelling    Obesity    Osteoarthritis    Pneumonia    hx of;last time at least 89yrago   PONV (postoperative nausea and vomiting)    Prediabetes    Shortness of breath    Sleep apnea    Swelling of lower extremity    Trouble in sleeping    Wheezing    Past Surgical History:  Procedure Laterality Date   BREAST EXCISIONAL BIOPSY Right    CARDIAC CATHETERIZATION     COLONOSCOPY     ESOPHAGOGASTRODUODENOSCOPY     KNEE SURGERY Right    arthroscopy   Right ganglion cyst     x 2   right lumpectomy  around [address] [place] 11/15/2013   Procedure: Marcaine/STEROID  INJECTION;  Surgeon: JoAlta CorningMD;  [facility];  Laterality: Left;   TOTAL KNEE ARTHROPLASTY Right 11/15/2013   Procedure: RIGHT TOTAL KNEE ARTHROPLASTY;  Surgeon: JoAlta CorningMD;  [facility] [facility];  Laterality: Right;   TOTAL KNEE ARTHROPLASTY Left 12/30/2016   Procedure: TOTAL KNEE ARTHROPLASTY;  Surgeon: GrDorna LeitzMD;  [place] [facility];  Laterality: Left;   TUBAL LIGATION     Patient Active Problem List   Diagnosis Date Noted   Chronic pain syndrome 02/09/2022   Left ankle pain 01/12/2021   Overweight with body mass index (BMI) of 29 to 29.9 in adult 08/17/2020   At risk for diabetes mellitus 0111/91/4782 Nonalcoholic hepatosteatosis 0195/62/1308 At risk for heart disease 07/06/2020   Transaminitis 04/07/2020   Thrombocytopenia ([facility]/10/2019   Insulin resistance 06/19/2019   Vitamin D deficiency 11/05/2018   Obstructive sleep apnea 10/10/2017   Primary osteoarthritis of left knee 12/30/2016   Class  1 obesity with serious comorbidity and body mass index (BMI) of 34.0 to 34.9 in adult 05/24/2016   Prediabetes 02/24/2014   Osteoarthritis of right knee 11/15/2013   Osteoarthritis of left knee 11/15/2013   Alkaline phosphatase elevation 01/02/2012   Hyperlipidemia 01/02/2012   Routine general medical examination at a health care facility 12/09/2011   Allergy to influenza vaccine 05/02/2011   POLYARTHRITIS 01/28/2010   Chronic pain of both knees 12/11/2009   ANGIOEDEMA 03/27/2007   Essential hypertension 03/19/2007   Perennial allergic rhinitis with seasonal variation 03/19/2007   Asthma, moderate persistent 03/19/2007   GERD 03/19/2007   HIATAL HERNIA 03/19/2007   GESTATIONAL DIABETES 03/19/2007    PCP: Abner Greenspan, MD  REFERRING PROVIDER: Margaretha Sheffield, MD  REFERRING DIAG: "LBP, B knee pain, lumbar spondylosis, h/o B TKA"  Rationale for Evaluation and Treatment  Rehabilitation  THERAPY DIAG:  Other low back pain  Chronic pain of left knee  Chronic pain of right knee  Other abnormalities of gait and mobility  Muscle weakness (generalized)  ONSET DATE: About six months, gradual onset  SUBJECTIVE:                                                                                                                                                                                           SUBJECTIVE STATEMENT: Pt notices that she has had worsening back pain over past few months. Has also noticed some tendency to stumble. Pain is worse in morning, tends to get better with movement. Pt states she received an injection with minimal improvement. Pt states daughter is an AT and gave her stretches which helps. Pt states her husband has been helping with housework due to difficulty bending/movement. Reports some numbness in B feet although she states that she hasn't had that recently, denies bowel/bladder changes, denies saddle anesthesia.    PERTINENT HISTORY:  Asthma, GERD, HTN, hx hiatal hernia  PAIN:  Are you having pain: yes, 6/10 Location: back and B knees, right around sacrum How would you describe your pain? Achey, occasional sharp pains Best in past week: 4/10 Worst in past week: 10/10 (takes a couple hours to settle) Aggravating factors: prolonged walking/standing, difficulty bending/lifting, stair navigation Easing factors: medication, ice, movement/stretches, TENS   PRECAUTIONS: None  WEIGHT BEARING RESTRICTIONS No  FALLS:  Has patient fallen in last 6 months? No although she does note some recent balance issues  LIVING ENVIRONMENT: Lives with family, 3 STE with B rails  OCCUPATION: works in lab  PLOF: [place] wants to be more mobile, less pain   OBJECTIVE:   DIAGNOSTIC FINDINGS:  Per 03/08/22 lumbar  MRI:  IMPRESSION: Mild, facet predominant degenerative changes as detailed above are similar to the prior  study. No significant stenosis.   Possible fibroid uterus. This could be further evaluated with ultrasound.  PATIENT SURVEYS:  FOTO 59%  SCREENING FOR RED FLAGS: Red flag questioning unremarkable  COGNITION:  Overall cognitive status: Within functional limits for tasks assessed     SENSATION: Light touch intact B LE, some increased sensitivity R medial knee pt states has been present since TKA   POSTURE: No Significant postural limitations  PALPATION: Tightness/TTP B quads, more notable medially TTP B QL and lumbar paraspinals, L more than R. TTP L glutes/piriformis. Concordant discomfort with grade 1-2 PA SI mobilizations, no change with repetition   LUMBAR ROM:   Active  A/PROM  eval  Flexion Mid shin, p!  Extension 75%, mild tenderness   Right lateral flexion   Left lateral flexion   Right rotation 75%  Left rotation 75% more pull   (Blank rows = not tested) Comment: able to achieve ~3 more inches of flexion after 5 reps repeated extension, about same amount of pain  LOWER EXTREMITY ROM:     Active  Right eval Left eval  Hip flexion    Hip extension    Hip internal rotation    Hip external rotation     (Blank rows = not tested)  Comments:    LOWER EXTREMITY MMT:    MMT Right eval Left eval  Hip flexion 4 4  Hip abduction (modified sitting) 5 5  Hip internal rotation 4 4  Hip external rotation 4- 4-  Knee flexion 5 5  Knee extension 4+ 4+   (Blank rows = not tested)  Comments: mild pain in B knees with extension and hip ER   FUNCTIONAL TESTS:  5xSTS standard chair: 17sec no UE support, reduced velocity with repetition  GAIT: Distance walked: within clinic Assistive device utilized: None Level of assistance: Complete Independence Comments: reduced step length B, reduced hip ext, reduced truncal ROM and arm swing B, reduced gait speed and cadence    TODAY'S TREATMENT  [facility] Adult PT Treatment:                                                 DATE: 04/11/22 Therapeutic Exercise: STS x10 chair, cues for form, pacing, and HEP performance Seated lumbar flexion x10, cues for form, pacing, and HEP performance   PATIENT EDUCATION:  Education details: Pt education on PT impairments, prognosis, and POC. Rationale for interventions, safe/appropriate HEP performance Person educated: Patient Education method: Explanation, Demonstration, Tactile cues, Verbal cues, and Handouts Education comprehension: verbalized understanding, returned demonstration, verbal cues required, tactile cues required, and needs further education    HOME EXERCISE PROGRAM: Access Code: [email or web address] URL: [email or web address] Date: 04/11/2022 Prepared by: Enis Slipper  Exercises - Sit to Stand with Armchair  - 1 x daily - 7 x weekly - 3 sets - 10 reps - Seated Flexion Stretch  - 1 x daily - 7 x weekly - 3 sets - 10 reps  ASSESSMENT:  CLINICAL IMPRESSION: Pt is a 61 year old woman who arrives to PT evaluation on this date for low back pain and B knee pain. Pt reports difficulty with daily/work activities due to pain, requires assistance from family for housework. During today's session pt demonstrates limitations in LE strength, lumbar mobility,  and tightness/tenderness to lumbar/hip musculature which are limiting ability to perform aforementioned activities. Pt with notable TTP throughout lumbar/hip musculature, limitations in ROM improve with repeated extension although no change in pain. Pt tolerates HEP well with no overt increase in pain, cues as needed and time spent with education for appropriate HEP performance. Recommend skilled PT to address aforementioned deficits to improve functional independence/tolerance.  Pt departs today's session in no acute distress, all voiced questions/concerns addressed appropriately from PT perspective.     OBJECTIVE IMPAIRMENTS Abnormal gait, decreased activity tolerance, decreased balance, decreased endurance,  decreased mobility, difficulty walking, decreased ROM, decreased strength, hypomobility, impaired flexibility, and pain.   ACTIVITY LIMITATIONS carrying, lifting, bending, sitting, standing, squatting, stairs, transfers, and dressing  PARTICIPATION LIMITATIONS: meal prep, cleaning, laundry, driving, shopping, community activity, and occupation  PERSONAL FACTORS Time since onset of injury/illness/exacerbation and 1-2 comorbidities: HTN, asthma  are also affecting patient's functional outcome.   REHAB POTENTIAL: Good  CLINICAL DECISION MAKING: Evolving/moderate complexity  EVALUATION COMPLEXITY: Moderate   GOALS: Goals reviewed with patient? No due to time constraints  SHORT TERM GOALS: Target date: 05/09/2022  Pt will demonstrate appropriate understanding and performance of initially prescribed HEP in order to facilitate improved independence with management of symptoms.  Baseline: HEP provided on eval Goal status: INITIAL   2. Pt will score greater than or equal to 64 on FOTO in order to demonstrate improved perception of function due to symptoms.  Baseline: 59  Goal status: INITIAL   LONG TERM GOALS: Target date: 06/06/2022  Pt will score 68 on FOTO in order to demonstrate improved perception of functional status due to symptoms.  Baseline: 59 Goal status: INITIAL  2.  Pt will demonstrate 100% lumbar flexion AROM with less than 3/10 pain on NPS in order to demonstrate improved tolerance to typical ADLs such as lower body dressing and lifting.  Baseline: see ROM chart Goal status: INITIAL  3.  Pt will demonstrate hip IR/ER MMT of 4+ /5 in order to demonstrate improved safety/stability with functional transfers. Baseline: see MMT chart Goal status: INITIAL  4. Pt will perform 5xSTS in <12 sec in order to demonstrate reduced fall risk and improved functional independence. (MCID of 2.3sec)  Baseline: 17sec  Goal status: INITIAL    PLAN: PT FREQUENCY: 1-2x/week  PT  DURATION: 8 weeks  PLANNED INTERVENTIONS: Therapeutic exercises, Therapeutic activity, Neuromuscular re-education, Balance training, Gait training, Patient/Family education, Self Care, Joint mobilization, Stair training, Aquatic Therapy, Dry Needling, Electrical stimulation, Spinal mobilization, Cryotherapy, Moist heat, Manual therapy, and Re-evaluation.  PLAN FOR NEXT SESSION: Progress ROM/strengthening exercises as able/appropriate, review HEP.   Leeroy Cha PT, DPT 04/11/2022 12:38 PM

## 2022-04-11 ENCOUNTER — Encounter: Payer: Self-pay | Admitting: Physical Therapy

## 2022-04-11 ENCOUNTER — Ambulatory Visit: Payer: 59 | Attending: Family Medicine | Admitting: Physical Therapy

## 2022-04-11 DIAGNOSIS — M25561 Pain in right knee: Secondary | ICD-10-CM | POA: Diagnosis not present

## 2022-04-11 DIAGNOSIS — G8929 Other chronic pain: Secondary | ICD-10-CM | POA: Insufficient documentation

## 2022-04-11 DIAGNOSIS — M6281 Muscle weakness (generalized): Secondary | ICD-10-CM | POA: Insufficient documentation

## 2022-04-11 DIAGNOSIS — M5459 Other low back pain: Secondary | ICD-10-CM | POA: Diagnosis not present

## 2022-04-11 DIAGNOSIS — R2689 Other abnormalities of gait and mobility: Secondary | ICD-10-CM | POA: Insufficient documentation

## 2022-04-11 DIAGNOSIS — M25562 Pain in left knee: Secondary | ICD-10-CM | POA: Insufficient documentation

## 2022-04-18 ENCOUNTER — Other Ambulatory Visit (HOSPITAL_COMMUNITY): Payer: Self-pay

## 2022-04-18 NOTE — Therapy (Signed)
OUTPATIENT PHYSICAL THERAPY TREATMENT NOTE   Patient Name: Katherine Tanner MRN: [id number] DOB:[birth year], 61 y.o., female Today's Date: 04/19/2022  PCP: Abner Greenspan, MD   REFERRING PROVIDER: Margaretha Sheffield, MD  END OF SESSION:   PT End of Session - 04/19/22 1404     Visit Number 2    Number of Visits 17    Date for PT Re-Evaluation 06/06/22    Authorization Type [employer] employee    Authorization Time Period auth required after 25th visit    Authorization - Visit Number 2    Authorization - Number of Visits 25    Progress Note Due on Visit 10    PT Start Time 1406    PT Stop Time 1448    PT Time Calculation (min) 42 min    Activity Tolerance Patient tolerated treatment well    Behavior During Therapy WFL for tasks assessed/performed             Past Medical History:  Diagnosis Date   Allergy    takes Claritin daily and Flonase daily   Arthritis    Asthma    uses Albuterol daily as needed;Singulair nightly;DUlera daily   Back pain    Constipation    Fatty liver    Food allergy    GERD (gastroesophageal reflux disease)    occasionally will take OTC meds but states she just watches what she eats   H/O hiatal hernia    History of bronchitis    last time 48yr ago   History of colon polyps    History of kidney stones    History of shingles    Hyperlipidemia    Hypertension    takes Hyzaar daily   Joint pain    Joint pain    Joint swelling    Obesity    Osteoarthritis    Pneumonia    hx of;last time at least 89yrago   PONV (postoperative nausea and vomiting)    Prediabetes    Shortness of breath    Sleep apnea    Swelling of lower extremity    Trouble in sleeping    Wheezing    Past Surgical History:  Procedure Laterality Date   BREAST EXCISIONAL BIOPSY Right    CARDIAC CATHETERIZATION     COLONOSCOPY     ESOPHAGOGASTRODUODENOSCOPY     KNEE SURGERY Right    arthroscopy   Right ganglion cyst     x 2   right lumpectomy  around  [place] 11/15/2013   Procedure: Marcaine/STEROID INJECTION;  Surgeon: JoAlta CorningMD;  [facility];  Laterality: Left;   TOTAL KNEE ARTHROPLASTY Right 11/15/2013   Procedure: RIGHT TOTAL KNEE ARTHROPLASTY;  Surgeon: JoAlta CorningMD;  [facility];  Laterality: Right;   TOTAL KNEE ARTHROPLASTY Left 12/30/2016   Procedure: TOTAL KNEE ARTHROPLASTY;  Surgeon: GrDorna LeitzMD;  [facility];  Laterality: Left;   TUBAL LIGATION     Patient Active Problem List   Diagnosis Date Noted   Chronic pain syndrome 02/09/2022   Left ankle pain 01/12/2021   Overweight with body mass index (BMI) of 29 to 29.9 in adult 08/17/2020   At risk for diabetes mellitus 0150/53/9767 Nonalcoholic hepatosteatosis 0134/19/3790 At risk for heart disease 07/06/2020   Transaminitis 04/07/2020   Thrombocytopenia ([facility]/10/2019   Insulin resistance 06/19/2019   Vitamin D deficiency 11/05/2018  Obstructive sleep apnea 10/10/2017   Primary osteoarthritis of left knee 12/30/2016   Class 1 obesity with serious comorbidity and body mass index (BMI) of 34.0 to 34.9 in adult 05/24/2016   Prediabetes 02/24/2014   Osteoarthritis of right knee 11/15/2013   Osteoarthritis of left knee 11/15/2013   Alkaline phosphatase elevation 01/02/2012   Hyperlipidemia 01/02/2012   Routine general medical examination at a health care facility 12/09/2011   Allergy to influenza vaccine 05/02/2011   POLYARTHRITIS 01/28/2010   Chronic pain of both knees 12/11/2009   ANGIOEDEMA 03/27/2007   Essential hypertension 03/19/2007   Perennial allergic rhinitis with seasonal variation 03/19/2007   Asthma, moderate persistent 03/19/2007   GERD 03/19/2007   HIATAL HERNIA 03/19/2007   GESTATIONAL DIABETES 03/19/2007    REFERRING DIAG:  "LBP, B knee pain, lumbar spondylosis, h/o B TKA"  THERAPY DIAG:  Other low back pain  Chronic pain of  left knee  Chronic pain of right knee  Other abnormalities of gait and mobility  Muscle weakness (generalized)  Rationale for Evaluation and Treatment Rehabilitation  PERTINENT HISTORY: Asthma, GERD, HTN, hx hiatal hernia  PRECAUTIONS: none  SUBJECTIVE:  Pt states she felt about the same as usual after last session, has been doing HEP without issue. Pt states she just got off work and has significantly increased pain.   PAIN:  Are you having pain: yes, 8/10 Location: back and B knees, right around sacrum How would you describe your pain? Achey, occasional sharp pains Best in past week: 4/10 Worst in past week: 10/10 (takes a couple hours to settle) Aggravating factors: prolonged walking/standing, difficulty bending/lifting, stair navigation Easing factors: medication, ice, movement/stretches, TENS   OBJECTIVE: (objective measures completed at initial evaluation unless otherwise dated)   DIAGNOSTIC FINDINGS:  Per 03/08/22 lumbar MRI:  IMPRESSION: Mild, facet predominant degenerative changes as detailed above are similar to the prior study. No significant stenosis.   Possible fibroid uterus. This could be further evaluated with ultrasound.   PATIENT SURVEYS:  FOTO 59%   SCREENING FOR RED FLAGS: Red flag questioning unremarkable   COGNITION:           Overall cognitive status: Within functional limits for tasks assessed                          SENSATION: Light touch intact B LE, some increased sensitivity R medial knee pt states has been present since TKA     POSTURE: No Significant postural limitations   PALPATION: Tightness/TTP B quads, more notable medially TTP B QL and lumbar paraspinals, L more than R. TTP L glutes/piriformis. Concordant discomfort with grade 1-2 PA SI mobilizations, no change with repetition     LUMBAR ROM:    Active  A/PROM  eval  Flexion Mid shin, p!  Extension 75%, mild tenderness   Right lateral flexion    Left lateral flexion     Right rotation 75%  Left rotation 75% more pull   (Blank rows = not tested) Comment: able to achieve ~3 more inches of flexion after 5 reps repeated extension, about same amount of pain   LOWER EXTREMITY ROM:      Active  Right eval Left eval  Hip flexion      Hip extension      Hip internal rotation      Hip external rotation       (Blank rows = not tested)   Comments:     LOWER EXTREMITY  MMT:     MMT Right eval Left eval  Hip flexion 4 4  Hip abduction (modified sitting) 5 5  Hip internal rotation 4 4  Hip external rotation 4- 4-  Knee flexion 5 5  Knee extension 4+ 4+   (Blank rows = not tested)   Comments: mild pain in B knees with extension and hip ER     FUNCTIONAL TESTS:  5xSTS standard chair: 17sec no UE support, reduced velocity with repetition   GAIT: Distance walked: within clinic Assistive device utilized: None Level of assistance: Complete Independence Comments: reduced step length B, reduced hip ext, reduced truncal ROM and arm swing B, reduced gait speed and cadence       TODAY'S TREATMENT  [facility] Adult PT Treatment:                                                DATE: 04/19/22 Therapeutic Exercise: STS 2x10 from chair, cues for increased fwd trunk lean STS x10 w RTB around knees Seated lumbar flexion x10, x10 3 way with physioball Seated march x10 BLE Seated LAQ x10 BLE Seated adductor isos 2x10 Swiss ball press down, standing 2x10 Heel raises w UE support x10   [facility] Adult PT Treatment:                                                DATE: 04/11/22 Therapeutic Exercise: STS x10 chair, cues for form, pacing, and HEP performance Seated lumbar flexion x10, cues for form, pacing, and HEP performance     PATIENT EDUCATION:  Education details: Pt education on PT impairments, prognosis, and POC. Rationale for interventions, safe/appropriate HEP performance Person educated: Patient Education method: Explanation, Demonstration, Tactile cues,  Verbal cues Education comprehension: verbalized understanding, returned demonstration, verbal cues required, tactile cues required, and needs further education      HOME EXERCISE PROGRAM: Access Code: [email or web address] URL: [email or web address] Date: 04/11/2022 Prepared by: Enis Slipper   Exercises - Sit to Stand with Armchair  - 1 x daily - 7 x weekly - 3 sets - 10 reps - Seated Flexion Stretch  - 1 x daily - 7 x weekly - 3 sets - 10 reps   ASSESSMENT:   CLINICAL IMPRESSION: Pt is a pleasant 61 year old woman who arrives to PT session on this date for treatment of low back pain and B knee pain. Pt arrives with increased pain she attributes to just getting off work, denies any significant changes since initial evaluation. Session initiated with HEP review, good performance with intermittent cues for mechanics. Followed with additional exercises emphasizing lumbar mobility and hip AROM/strengthening. Pt tolerates session well overall, some mild discomfort initially with LE exercises but improves with repetition. Pt departs with report of 6.5-7/10 pain compared to 8/10 on arrival although she reports notable improvement in stiffness.   Pt departs today's session in no acute distress, all voiced questions/concerns addressed appropriately from PT perspective.       OBJECTIVE IMPAIRMENTS Abnormal gait, decreased activity tolerance, decreased balance, decreased endurance, decreased mobility, difficulty walking, decreased ROM, decreased strength, hypomobility, impaired flexibility, and pain.    ACTIVITY LIMITATIONS carrying, lifting, bending, sitting, standing, squatting, stairs, transfers, and dressing   PARTICIPATION LIMITATIONS: meal prep,  cleaning, laundry, driving, shopping, community activity, and occupation   PERSONAL FACTORS Time since onset of injury/illness/exacerbation and 1-2 comorbidities: HTN, asthma  are also affecting patient's functional outcome.    REHAB POTENTIAL:  Good   CLINICAL DECISION MAKING: Evolving/moderate complexity   EVALUATION COMPLEXITY: Moderate     GOALS: Goals reviewed with patient? No due to time constraints   SHORT TERM GOALS: Target date: 05/09/2022   Pt will demonstrate appropriate understanding and performance of initially prescribed HEP in order to facilitate improved independence with management of symptoms.  Baseline: HEP provided on eval Goal status: INITIAL    2. Pt will score greater than or equal to 64 on FOTO in order to demonstrate improved perception of function due to symptoms.            Baseline: 59            Goal status: INITIAL    LONG TERM GOALS: Target date: 06/06/2022   Pt will score 68 on FOTO in order to demonstrate improved perception of functional status due to symptoms.  Baseline: 59 Goal status: INITIAL   2.  Pt will demonstrate 100% lumbar flexion AROM with less than 3/10 pain on NPS in order to demonstrate improved tolerance to typical ADLs such as lower body dressing and lifting.  Baseline: see ROM chart Goal status: INITIAL   3.  Pt will demonstrate hip IR/ER MMT of 4+ /5 in order to demonstrate improved safety/stability with functional transfers. Baseline: see MMT chart Goal status: INITIAL   4. Pt will perform 5xSTS in <12 sec in order to demonstrate reduced fall risk and improved functional independence. (MCID of 2.3sec)            Baseline: 17sec            Goal status: INITIAL      PLAN: PT FREQUENCY: 1-2x/week   PT DURATION: 8 weeks   PLANNED INTERVENTIONS: Therapeutic exercises, Therapeutic activity, Neuromuscular re-education, Balance training, Gait training, Patient/Family education, Self Care, Joint mobilization, Stair training, Aquatic Therapy, Dry Needling, Electrical stimulation, Spinal mobilization, Cryotherapy, Moist heat, Manual therapy, and Re-evaluation.   PLAN FOR NEXT SESSION: Progress ROM/strengthening exercises as able/appropriate, review/update HEP as  appropriate. Would likely benefit from increased time spent in [place], PT 04/19/2022, 2:50 PM

## 2022-04-19 ENCOUNTER — Encounter: Payer: Self-pay | Admitting: Physical Therapy

## 2022-04-19 ENCOUNTER — Ambulatory Visit: Payer: 59 | Admitting: Physical Therapy

## 2022-04-19 DIAGNOSIS — M25562 Pain in left knee: Secondary | ICD-10-CM | POA: Diagnosis not present

## 2022-04-19 DIAGNOSIS — M25561 Pain in right knee: Secondary | ICD-10-CM | POA: Diagnosis not present

## 2022-04-19 DIAGNOSIS — G8929 Other chronic pain: Secondary | ICD-10-CM | POA: Diagnosis not present

## 2022-04-19 DIAGNOSIS — R2689 Other abnormalities of gait and mobility: Secondary | ICD-10-CM | POA: Diagnosis not present

## 2022-04-19 DIAGNOSIS — M6281 Muscle weakness (generalized): Secondary | ICD-10-CM

## 2022-04-19 DIAGNOSIS — M5459 Other low back pain: Secondary | ICD-10-CM | POA: Diagnosis not present

## 2022-04-20 NOTE — Therapy (Signed)
OUTPATIENT PHYSICAL THERAPY TREATMENT NOTE   Patient Name: Katherine Tanner MRN: [id number] DOB:1960/07/19, 61 y.o., female Today's Date: 04/21/2022  PCP: Judy Pimple, MD   REFERRING PROVIDER: Verdon Cummins, MD  END OF SESSION:   PT End of Session - 04/21/22 1420     Visit Number 3    Number of Visits 17    Date for PT Re-Evaluation 06/06/22    Authorization Type [employer] employee    Authorization Time Period auth required after 25th visit    Authorization - Visit Number 3    Authorization - Number of Visits 25    Progress Note Due on Visit 10    PT Start Time 1418    PT Stop Time 1500    PT Time Calculation (min) 42 min    Activity Tolerance Patient tolerated treatment well    Behavior During Therapy WFL for tasks assessed/performed              Past Medical History:  Diagnosis Date   Allergy    takes Claritin daily and Flonase daily   Arthritis    Asthma    uses Albuterol daily as needed;Singulair nightly;DUlera daily   Back pain    Constipation    Fatty liver    Food allergy    GERD (gastroesophageal reflux disease)    occasionally will take OTC meds but states she just watches what she eats   H/O hiatal hernia    History of bronchitis    last time 10yrs ago   History of colon polyps    History of kidney stones    History of shingles    Hyperlipidemia    Hypertension    takes Hyzaar daily   Joint pain    Joint pain    Joint swelling    Obesity    Osteoarthritis    Pneumonia    hx of;last time at least 41yrs ago   PONV (postoperative nausea and vomiting)    Prediabetes    Shortness of breath    Sleep apnea    Swelling of lower extremity    Trouble in sleeping    Wheezing    Past Surgical History:  Procedure Laterality Date   BREAST EXCISIONAL BIOPSY Right    CARDIAC CATHETERIZATION     COLONOSCOPY     ESOPHAGOGASTRODUODENOSCOPY     KNEE SURGERY Right    arthroscopy   Right ganglion cyst     x 2   right lumpectomy   around 1983   STERIOD INJECTION Left 11/15/2013   Procedure: Marcaine/STEROID INJECTION;  Surgeon: Harvie Junior, MD;  Location: [facility] OR;  Service: Orthopedics;  Laterality: Left;   TOTAL KNEE ARTHROPLASTY Right 11/15/2013   Procedure: RIGHT TOTAL KNEE ARTHROPLASTY;  Surgeon: Harvie Junior, MD;  Location: [facility] OR;  Service: Orthopedics;  Laterality: Right;   TOTAL KNEE ARTHROPLASTY Left 12/30/2016   Procedure: TOTAL KNEE ARTHROPLASTY;  Surgeon: Jodi Geralds, MD;  Location: [facility] OR;  Service: Orthopedics;  Laterality: Left;   TUBAL LIGATION     Patient Active Problem List   Diagnosis Date Noted   Chronic pain syndrome 02/09/2022   Left ankle pain 01/12/2021   Overweight with body mass index (BMI) of 29 to 29.9 in adult 08/17/2020   At risk for diabetes mellitus 07/27/2020   Nonalcoholic hepatosteatosis 07/06/2020   At risk for heart disease 07/06/2020   Transaminitis 04/07/2020   Thrombocytopenia (HCC) 07/08/2019   Insulin resistance 06/19/2019   Vitamin D deficiency  11/05/2018   Obstructive sleep apnea 10/10/2017   Primary osteoarthritis of left knee 12/30/2016   Class 1 obesity with serious comorbidity and body mass index (BMI) of 34.0 to 34.9 in adult 05/24/2016   Prediabetes 02/24/2014   Osteoarthritis of right knee 11/15/2013   Osteoarthritis of left knee 11/15/2013   Alkaline phosphatase elevation 01/02/2012   Hyperlipidemia 01/02/2012   Routine general medical examination at a health care facility 12/09/2011   Allergy to influenza vaccine 05/02/2011   POLYARTHRITIS 01/28/2010   Chronic pain of both knees 12/11/2009   ANGIOEDEMA 03/27/2007   Essential hypertension 03/19/2007   Perennial allergic rhinitis with seasonal variation 03/19/2007   Asthma, moderate persistent 03/19/2007   GERD 03/19/2007   HIATAL HERNIA 03/19/2007   GESTATIONAL DIABETES 03/19/2007    REFERRING DIAG:  "LBP, B knee pain, lumbar spondylosis, h/o B TKA"  THERAPY DIAG:  Other low back pain  Chronic pain  of left knee  Chronic pain of right knee  Other abnormalities of gait and mobility  Muscle weakness (generalized)  Rationale for Evaluation and Treatment Rehabilitation  PERTINENT HISTORY: Asthma, GERD, HTN, hx hiatal hernia  PRECAUTIONS: none  SUBJECTIVE:  Pt just got off work and 8/10 pain  She felt like crying because of the pain. I almost fell at work today and I bent over to get specimens. And when I step back and turn. I have to grab hold in my back.  I feel like my leg is going to give out.    PAIN:  Are you having pain: yes, 8/10 Location: back and B knees, right around sacrum How would you describe your pain? Achey, occasional sharp pains Best in past week: 4/10 Worst in past week: 10/10 (takes a couple hours to settle) Aggravating factors: prolonged walking/standing, difficulty bending/lifting, stair navigation Easing factors: medication, ice, movement/stretches, TENS   OBJECTIVE: (objective measures completed at initial evaluation unless otherwise dated)   DIAGNOSTIC FINDINGS:  Per 03/08/22 lumbar MRI:  IMPRESSION: Mild, facet predominant degenerative changes as detailed above are similar to the prior study. No significant stenosis.   Possible fibroid uterus. This could be further evaluated with ultrasound.   PATIENT SURVEYS:  FOTO 59%   SCREENING FOR RED FLAGS: Red flag questioning unremarkable   COGNITION:           Overall cognitive status: Within functional limits for tasks assessed                          SENSATION: Light touch intact B LE, some increased sensitivity R medial knee pt states has been present since TKA     POSTURE: No Significant postural limitations   PALPATION: Tightness/TTP B quads, more notable medially TTP B QL and lumbar paraspinals, L more than R. TTP L glutes/piriformis. Concordant discomfort with grade 1-2 PA SI mobilizations, no change with repetition     LUMBAR ROM:    Active  A/PROM  eval  Flexion Mid shin, p!   Extension 75%, mild tenderness   Right lateral flexion    Left lateral flexion    Right rotation 75%  Left rotation 75% more pull   (Blank rows = not tested) Comment: able to achieve ~3 more inches of flexion after 5 reps repeated extension, about same amount of pain   LOWER EXTREMITY ROM:      Active  Right eval Left eval  Hip flexion      Hip extension      Hip  internal rotation      Hip external rotation       (Blank rows = not tested)   Comments:     LOWER EXTREMITY MMT:     MMT Right eval Left eval  Hip flexion 4 4  Hip abduction (modified sitting) 5 5  Hip internal rotation 4 4  Hip external rotation 4- 4-  Knee flexion 5 5  Knee extension 4+ 4+   (Blank rows = not tested)   Comments: mild pain in B knees with extension and hip ER     FUNCTIONAL TESTS:  5xSTS standard chair: 17sec no UE support, reduced velocity with repetition   GAIT: Distance walked: within clinic Assistive device utilized: None Level of assistance: Complete Independence Comments: reduced step length B, reduced hip ext, reduced truncal ROM and arm swing B, reduced gait speed and cadence       TODAY'S TREATMENT  [facility] Adult PT Treatment:                                                DATE: 04-21-22 Therapeutic Exercise: Seated lumbar flexion x10, x10 3 way STS 2x10 from chair, cues for increased fwd trunk lean Seated LAQ  2x10 BLE Heel raises w UE support x10  PPT x 20 Trunk rotation 5 x 20 sec hold to R and to L Bridge with ball x 20   Supine Piriformis Stretch with Foot on Ground  3 x 30 sec hold on R and L  Manual Therapy STW QL myofascial release STW of bil gluteals and lumbar Trigger Point Dry Needling Treatment: Pre-treatment instruction: Patient instructed on dry needling rationale, procedures, and possible side effects including pain during treatment (achy,cramping feeling), bruising, drop of blood, lightheadedness, nausea, sweating. Patient Consent Given:  Yes Education handout provided: Yes Muscles treated: BIL QL, lumbar 3-S-1 bil , gluteals along iliac crest  Needle size and number: .30x2mm x 2  .40 with 105 mm x 2 Electrical stimulation performed: No Parameters: N/A Treatment response/outcome: Twitch response elicited and Palpable decrease in muscle tension Post-treatment instructions: Patient instructed to expect possible mild to moderate muscle soreness later today and/or tomorrow. Patient instructed in methods to reduce muscle soreness and to continue prescribed HEP. If patient was dry needled over the lung field, patient was instructed on signs and symptoms of pneumothorax and, however unlikely, to see immediate medical attention should they occur. Patient was also educated on signs and symptoms of infection and to seek medical attention should they occur. Patient verbalized understanding of these instructions and education.   Modalities: Moist hot pack concurrent with supine exercise    [facility] Adult PT Treatment:                                                DATE: 04/19/22 Therapeutic Exercise: STS 2x10 from chair, cues for increased fwd trunk lean STS x10 w RTB around knees Seated lumbar flexion x10, x10 3 way with physioball Seated march x10 BLE Seated LAQ x10 BLE Seated adductor isos 2x10 Swiss ball press down, standing 2x10 Heel raises w UE support x10   [facility] Adult PT Treatment:  DATE: 04/11/22 Therapeutic Exercise: STS x10 chair, cues for form, pacing, and HEP performance Seated lumbar flexion x10, cues for form, pacing, and HEP performance     PATIENT EDUCATION:  Education details: Pt education on PT impairments, prognosis, and POC. Rationale for interventions, safe/appropriate HEP performance Person educated: Patient Education method: Explanation, Demonstration, Tactile cues, Verbal cues Education comprehension: verbalized understanding, returned demonstration, verbal cues  required, tactile cues required, and needs further education      HOME EXERCISE PROGRAM: Access Code: [email or web address] URL: [email or web address] Date: 04/21/2022 Prepared by: Garen Lah  Exercises - Sit to Stand with Armchair  - 1 x daily - 7 x weekly - 3 sets - 10 reps - Seated Flexion Stretch  - 1 x daily - 7 x weekly - 3 sets - 10 reps - Supine Posterior Pelvic Tilt  - 1 x daily - 7 x weekly - 1 sets - 10 reps - 10 sec hold hold - Supine Bridge with Mini Swiss Ball Between Knees  - 1 x daily - 7 x weekly - 3 sets - 10 reps - Supine Lower Trunk Rotation  - 1 x daily - 7 x weekly - 1 sets - 5 reps - 20 sec  hold - Supine Piriformis Stretch with Foot on Ground  - 1 x daily - 7 x weekly - 1 sets - 3 reps - 30sec hold   ASSESSMENT:   CLINICAL IMPRESSION:  Pt enters clinic with 8/10 in low back.  At end of session, pt back 4/10, knees 5/10.  Pt consented to TPDN and was closely monitored throughout session with no adverse effects.  Pt with decreased muscle tension and decreased pain in low back post TPDN.  Rest of sesssion emphasized core activation and flexibility / strengthening with HEP modified and sent electronically to cell phone at pt request.  Pt states she feels better at end of session.  Will continue to progress strength to increase QOL for active life     EVAL- Pt is a pleasant 61 year old woman who arrives to PT session on this date for treatment of low back pain and B knee pain. Pt arrives with increased pain she attributes to just getting off work, denies any significant changes since initial evaluation. Session initiated with HEP review, good performance with intermittent cues for mechanics. Followed with additional exercises emphasizing lumbar mobility and hip AROM/strengthening. Pt tolerates session well overall, some mild discomfort initially with LE exercises but improves with repetition. Pt departs with report of 6.5-7/10 pain compared to 8/10 on arrival  although she reports notable improvement in stiffness.   Pt departs today's session in no acute distress, all voiced questions/concerns addressed appropriately from PT perspective.       OBJECTIVE IMPAIRMENTS Abnormal gait, decreased activity tolerance, decreased balance, decreased endurance, decreased mobility, difficulty walking, decreased ROM, decreased strength, hypomobility, impaired flexibility, and pain.    ACTIVITY LIMITATIONS carrying, lifting, bending, sitting, standing, squatting, stairs, transfers, and dressing   PARTICIPATION LIMITATIONS: meal prep, cleaning, laundry, driving, shopping, community activity, and occupation   PERSONAL FACTORS Time since onset of injury/illness/exacerbation and 1-2 comorbidities: HTN, asthma  are also affecting patient's functional outcome.    REHAB POTENTIAL: Good   CLINICAL DECISION MAKING: Evolving/moderate complexity   EVALUATION COMPLEXITY: Moderate     GOALS: Goals reviewed with patient? No due to time constraints   SHORT TERM GOALS: Target date: 05/09/2022   Pt will demonstrate appropriate understanding and performance of initially prescribed HEP in order to facilitate  improved independence with management of symptoms.  Baseline: HEP provided on eval 04-21-22  HEP updated Goal status: ONGOING    2. Pt will score greater than or equal to 64 on FOTO in order to demonstrate improved perception of function due to symptoms.            Baseline: 59            Goal status: ONGOING   LONG TERM GOALS: Target date: 06/06/2022   Pt will score 68 on FOTO in order to demonstrate improved perception of functional status due to symptoms.  Baseline: 59 Goal status: INITIAL   2.  Pt will demonstrate 100% lumbar flexion AROM with less than 3/10 pain on NPS in order to demonstrate improved tolerance to typical ADLs such as lower body dressing and lifting.  Baseline: see ROM chart Goal status: INITIAL   3.  Pt will demonstrate hip IR/ER MMT of 4+  /5 in order to demonstrate improved safety/stability with functional transfers. Baseline: see MMT chart Goal status: INITIAL   4. Pt will perform 5xSTS in <12 sec in order to demonstrate reduced fall risk and improved functional independence. (MCID of 2.3sec)            Baseline: 17sec            Goal status: INITIAL      PLAN: PT FREQUENCY: 1-2x/week   PT DURATION: 8 weeks   PLANNED INTERVENTIONS: Therapeutic exercises, Therapeutic activity, Neuromuscular re-education, Balance training, Gait training, Patient/Family education, Self Care, Joint mobilization, Stair training, Aquatic Therapy, Dry Needling, Electrical stimulation, Spinal mobilization, Cryotherapy, Moist heat, Manual therapy, and Re-evaluation.   PLAN FOR NEXT SESSION: Progress ROM/strengthening exercises as able/appropriate, review/update HEP as appropriate. Would likely benefit from increased time spent in [facility]    Garen Lah, PT, [facility] Certified Exercise Expert for the Aging Adult  04/21/22 3:02 PM Phone: [phone] Fax: [phone]

## 2022-04-21 ENCOUNTER — Ambulatory Visit: Payer: 59 | Admitting: Physical Therapy

## 2022-04-21 ENCOUNTER — Encounter: Payer: Self-pay | Admitting: Physical Therapy

## 2022-04-21 DIAGNOSIS — M6281 Muscle weakness (generalized): Secondary | ICD-10-CM | POA: Diagnosis not present

## 2022-04-21 DIAGNOSIS — G8929 Other chronic pain: Secondary | ICD-10-CM

## 2022-04-21 DIAGNOSIS — M25561 Pain in right knee: Secondary | ICD-10-CM | POA: Diagnosis not present

## 2022-04-21 DIAGNOSIS — M5459 Other low back pain: Secondary | ICD-10-CM | POA: Diagnosis not present

## 2022-04-21 DIAGNOSIS — M25562 Pain in left knee: Secondary | ICD-10-CM | POA: Diagnosis not present

## 2022-04-21 DIAGNOSIS — R2689 Other abnormalities of gait and mobility: Secondary | ICD-10-CM

## 2022-04-21 NOTE — Patient Instructions (Signed)

## 2022-04-22 NOTE — Therapy (Signed)
OUTPATIENT PHYSICAL THERAPY TREATMENT NOTE   Patient Name: Katherine Tanner MRN: [id number] DOB:04-11-1961, 61 y.o., female Today's Date: 04/25/2022  PCP: Abner Greenspan, MD   REFERRING PROVIDER: Margaretha Sheffield, MD  END OF SESSION:   PT End of Session - 04/25/22 1411     Visit Number 4    Number of Visits 17    Date for PT Re-Evaluation 06/06/22    Authorization Type [employer] employee    Authorization Time Period auth required after 25th visit    Authorization - Visit Number 4    Authorization - Number of Visits 25    Progress Note Due on Visit 10    PT Start Time 0254    PT Stop Time 1458    PT Time Calculation (min) 46 min    Activity Tolerance Patient tolerated treatment well;No increased pain    Behavior During Therapy WFL for tasks assessed/performed               Past Medical History:  Diagnosis Date   Allergy    takes Claritin daily and Flonase daily   Arthritis    Asthma    uses Albuterol daily as needed;Singulair nightly;DUlera daily   Back pain    Constipation    Fatty liver    Food allergy    GERD (gastroesophageal reflux disease)    occasionally will take OTC meds but states she just watches what she eats   H/O hiatal hernia    History of bronchitis    last time 41yr ago   History of colon polyps    History of kidney stones    History of shingles    Hyperlipidemia    Hypertension    takes Hyzaar daily   Joint pain    Joint pain    Joint swelling    Obesity    Osteoarthritis    Pneumonia    hx of;last time at least 89yrago   PONV (postoperative nausea and vomiting)    Prediabetes    Shortness of breath    Sleep apnea    Swelling of lower extremity    Trouble in sleeping    Wheezing    Past Surgical History:  Procedure Laterality Date   BREAST EXCISIONAL BIOPSY Right    CARDIAC CATHETERIZATION     COLONOSCOPY     ESOPHAGOGASTRODUODENOSCOPY     KNEE SURGERY Right    arthroscopy   Right ganglion cyst     x 2    right lumpectomy  around [place] 11/15/2013   Procedure: Marcaine/STEROID INJECTION;  Surgeon: JoAlta CorningMD;  [facility];  Laterality: Left;   TOTAL KNEE ARTHROPLASTY Right 11/15/2013   Procedure: RIGHT TOTAL KNEE ARTHROPLASTY;  Surgeon: JoAlta CorningMD;  [facility];  Laterality: Right;   TOTAL KNEE ARTHROPLASTY Left 12/30/2016   Procedure: TOTAL KNEE ARTHROPLASTY;  Surgeon: GrDorna LeitzMD;  [facility];  Laterality: Left;   TUBAL LIGATION     Patient Active Problem List   Diagnosis Date Noted   Chronic pain syndrome 02/09/2022   Left ankle pain 01/12/2021   Overweight with body mass index (BMI) of 29 to 29.9 in adult 08/17/2020   At risk for diabetes mellitus 0127/12/2374 Nonalcoholic hepatosteatosis 0128/31/5176 At risk for heart disease 07/06/2020   Transaminitis 04/07/2020   Thrombocytopenia ([facility]01/10/2019   Insulin resistance 06/19/2019  Vitamin D deficiency 11/05/2018   Obstructive sleep apnea 10/10/2017   Primary osteoarthritis of left knee 12/30/2016   Class 1 obesity with serious comorbidity and body mass index (BMI) of 34.0 to 34.9 in adult 05/24/2016   Prediabetes 02/24/2014   Osteoarthritis of right knee 11/15/2013   Osteoarthritis of left knee 11/15/2013   Alkaline phosphatase elevation 01/02/2012   Hyperlipidemia 01/02/2012   Routine general medical examination at a health care facility 12/09/2011   Allergy to influenza vaccine 05/02/2011   POLYARTHRITIS 01/28/2010   Chronic pain of both knees 12/11/2009   ANGIOEDEMA 03/27/2007   Essential hypertension 03/19/2007   Perennial allergic rhinitis with seasonal variation 03/19/2007   Asthma, moderate persistent 03/19/2007   GERD 03/19/2007   HIATAL HERNIA 03/19/2007   GESTATIONAL DIABETES 03/19/2007    REFERRING DIAG:  "LBP, B knee pain, lumbar spondylosis, h/o B TKA"  THERAPY DIAG:  Other low back  pain  Chronic pain of left knee  Chronic pain of right knee  Other abnormalities of gait and mobility  Muscle weakness (generalized)  Rationale for Evaluation and Treatment Rehabilitation  PERTINENT HISTORY: Asthma, GERD, HTN, hx hiatal hernia  PRECAUTIONS: none  SUBJECTIVE:  Pt states she felt like dry needling was helpful, gave her relief for about a day before returning back to baseline. Pt states work activities remain primary aggravating activity. No other new updates   PAIN:  Are you having pain: yes, 5/10 Location: back and B knees, right around sacrum How would you describe your pain? Achey, occasional sharp pains Best in past week: 4/10 Worst in past week: 10/10 (takes a couple hours to settle) Aggravating factors: prolonged walking/standing, difficulty bending/lifting, stair navigation Easing factors: medication, ice, movement/stretches, TENS   OBJECTIVE: (objective measures completed at initial evaluation unless otherwise dated)   DIAGNOSTIC FINDINGS:  Per 03/08/22 lumbar MRI:  IMPRESSION: Mild, facet predominant degenerative changes as detailed above are similar to the prior study. No significant stenosis.   Possible fibroid uterus. This could be further evaluated with ultrasound.   PATIENT SURVEYS:  FOTO 59%   SCREENING FOR RED FLAGS: Red flag questioning unremarkable   COGNITION:           Overall cognitive status: Within functional limits for tasks assessed                          SENSATION: Light touch intact B LE, some increased sensitivity R medial knee pt states has been present since TKA     POSTURE: No Significant postural limitations   PALPATION: Tightness/TTP B quads, more notable medially TTP B QL and lumbar paraspinals, L more than R. TTP L glutes/piriformis. Concordant discomfort with grade 1-2 PA SI mobilizations, no change with repetition     LUMBAR ROM:    Active  A/PROM  eval  Flexion Mid shin, p!  Extension 75%, mild  tenderness   Right lateral flexion    Left lateral flexion    Right rotation 75%  Left rotation 75% more pull   (Blank rows = not tested) Comment: able to achieve ~3 more inches of flexion after 5 reps repeated extension, about same amount of pain   LOWER EXTREMITY ROM:      Active  Right eval Left eval  Hip flexion      Hip extension      Hip internal rotation      Hip external rotation       (Blank rows = not tested)  Comments:     LOWER EXTREMITY MMT:     MMT Right eval Left eval  Hip flexion 4 4  Hip abduction (modified sitting) 5 5  Hip internal rotation 4 4  Hip external rotation 4- 4-  Knee flexion 5 5  Knee extension 4+ 4+   (Blank rows = not tested)   Comments: mild pain in B knees with extension and hip ER     FUNCTIONAL TESTS:  5xSTS standard chair: 17sec no UE support, reduced velocity with repetition   GAIT: Distance walked: within clinic Assistive device utilized: None Level of assistance: Complete Independence Comments: reduced step length B, reduced hip ext, reduced truncal ROM and arm swing B, reduced gait speed and cadence       TODAY'S TREATMENT  [facility] Adult PT Treatment:                                                DATE: 04/25/22 Therapeutic Exercise: Swiss ball flexion 10 each way (fwd and each side) cues for form STS 5# from low mat, 2x8 cues for trunk lean and weight shift Swiss ball press down fwd and lateral, x10 each way, cues for reduced compensations at shoulders Step ups 4inch w/ ipsilateral DB 5#, 3x5 each LE cues for sagittal plane mechanics, posture, and reduced velocity  Heel raises at counter 2x10 Cybex hip abd 12.5# x10 each LE, cues for hand support, posture, and reduced trunk lean Manual Therapy: Prone, STM B QL and lumbar paraspinals, trigger point release distal Qls B Prone, STM B gluteals/piriformis, L>R Passive QL stretch prone B 26mn   [facility] Adult PT Treatment:                                                 DATE: 04-21-22 Therapeutic Exercise: Seated lumbar flexion x10, x10 3 way STS 2x10 from chair, cues for increased fwd trunk lean Seated LAQ  2x10 BLE Heel raises w UE support x10  PPT x 20 Trunk rotation 5 x 20 sec hold to R and to L Bridge with ball x 20   Supine Piriformis Stretch with Foot on Ground  3 x 30 sec hold on R and L  Manual Therapy STW QL myofascial release STW of bil gluteals and lumbar Trigger Point Dry Needling Treatment: Pre-treatment instruction: Patient instructed on dry needling rationale, procedures, and possible side effects including pain during treatment (achy,cramping feeling), bruising, drop of blood, lightheadedness, nausea, sweating. Patient Consent Given: Yes Education handout provided: Yes Muscles treated: BIL QL, lumbar 3-S-1 bil , gluteals along iliac crest  Needle size and number: .[id number] 2  .40 with 105 mm x 2 Electrical stimulation performed: No Parameters: N/A Treatment response/outcome: Twitch response elicited and Palpable decrease in muscle tension Post-treatment instructions: Patient instructed to expect possible mild to moderate muscle soreness later today and/or tomorrow. Patient instructed in methods to reduce muscle soreness and to continue prescribed HEP. If patient was dry needled over the lung field, patient was instructed on signs and symptoms of pneumothorax and, however unlikely, to see immediate medical attention should they occur. Patient was also educated on signs and symptoms of infection and to seek medical attention should they occur. Patient verbalized understanding of  these instructions and education.   Modalities: Moist hot pack concurrent with supine exercise    [facility] Adult PT Treatment:                                                DATE: 04/19/22 Therapeutic Exercise: STS 2x10 from chair, cues for increased fwd trunk lean STS x10 w RTB around knees Seated lumbar flexion x10, x10 3 way with physioball Seated march x10  BLE Seated LAQ x10 BLE Seated adductor isos 2x10 Swiss ball press down, standing 2x10 Heel raises w UE support x10     PATIENT EDUCATION:  Education details: Pt education on PT impairments, prognosis, and POC. Rationale for interventions, safe/appropriate HEP performance Person educated: Patient Education method: Explanation, Demonstration, Tactile cues, Verbal cues Education comprehension: verbalized understanding, returned demonstration, verbal cues required, tactile cues required, and needs further education      HOME EXERCISE PROGRAM: Access Code: [email or web address] URL: [email or web address] Date: 04/21/2022 Prepared by: Voncille Lo  Exercises - Sit to Stand with Armchair  - 1 x daily - 7 x weekly - 3 sets - 10 reps - Seated Flexion Stretch  - 1 x daily - 7 x weekly - 3 sets - 10 reps - Supine Posterior Pelvic Tilt  - 1 x daily - 7 x weekly - 1 sets - 10 reps - 10 sec hold hold - Supine Bridge with Mini Swiss Ball Between Knees  - 1 x daily - 7 x weekly - 3 sets - 10 reps - Supine Lower Trunk Rotation  - 1 x daily - 7 x weekly - 1 sets - 5 reps - 20 sec  hold - Supine Piriformis Stretch with Foot on Ground  - 1 x daily - 7 x weekly - 1 sets - 3 reps - 30sec hold   ASSESSMENT:   CLINICAL IMPRESSION: Pt arrives with 5/10 pain on NPS in low back, reports relief from dry needling last session that lasted about a day before returning to baseline. Session initiated with manual therapy, palpable muscle tightness B QL and glutes that improves with STM and passive stretching. Pt reports improvement in pain to 3/10 post manual. Pt with good tolerance to progression for increased volume/resistance for familiar program on this date with addition of loaded step ups for improved closed chain LE strength and core endurance. Pt departs in no acute distress with report of 3/10 pain on NPS, significant improvement in stiffness, no adverse events; all voiced questions/concerns addressed  appropriately from PT perspective.        OBJECTIVE IMPAIRMENTS Abnormal gait, decreased activity tolerance, decreased balance, decreased endurance, decreased mobility, difficulty walking, decreased ROM, decreased strength, hypomobility, impaired flexibility, and pain.    ACTIVITY LIMITATIONS carrying, lifting, bending, sitting, standing, squatting, stairs, transfers, and dressing   PARTICIPATION LIMITATIONS: meal prep, cleaning, laundry, driving, shopping, community activity, and occupation   PERSONAL FACTORS Time since onset of injury/illness/exacerbation and 1-2 comorbidities: HTN, asthma  are also affecting patient's functional outcome.    REHAB POTENTIAL: Good   CLINICAL DECISION MAKING: Evolving/moderate complexity   EVALUATION COMPLEXITY: Moderate     GOALS: Goals reviewed with patient? No due to time constraints   SHORT TERM GOALS: Target date: 05/09/2022   Pt will demonstrate appropriate understanding and performance of initially prescribed HEP in order to facilitate improved independence with management of symptoms.  Baseline: HEP  provided on eval 04-21-22  HEP updated Goal status: ONGOING    2. Pt will score greater than or equal to 64 on FOTO in order to demonstrate improved perception of function due to symptoms.            Baseline: 59            Goal status: ONGOING   LONG TERM GOALS: Target date: 06/06/2022   Pt will score 68 on FOTO in order to demonstrate improved perception of functional status due to symptoms.  Baseline: 59 Goal status: INITIAL   2.  Pt will demonstrate 100% lumbar flexion AROM with less than 3/10 pain on NPS in order to demonstrate improved tolerance to typical ADLs such as lower body dressing and lifting.  Baseline: see ROM chart Goal status: INITIAL   3.  Pt will demonstrate hip IR/ER MMT of 4+ /5 in order to demonstrate improved safety/stability with functional transfers. Baseline: see MMT chart Goal status: INITIAL   4. Pt will  perform 5xSTS in <12 sec in order to demonstrate reduced fall risk and improved functional independence. (MCID of 2.3sec)            Baseline: 17sec            Goal status: INITIAL      PLAN: PT FREQUENCY: 1-2x/week   PT DURATION: 8 weeks   PLANNED INTERVENTIONS: Therapeutic exercises, Therapeutic activity, Neuromuscular re-education, Balance training, Gait training, Patient/Family education, Self Care, Joint mobilization, Stair training, Aquatic Therapy, Dry Needling, Electrical stimulation, Spinal mobilization, Cryotherapy, Moist heat, Manual therapy, and Re-evaluation.   PLAN FOR NEXT SESSION: Progress ROM/strengthening exercises as able/appropriate, review/update HEP as appropriate. Would likely benefit from increased time spent in [facility]    Leeroy Cha PT, DPT 04/25/2022 3:01 PM

## 2022-04-25 ENCOUNTER — Encounter: Payer: Self-pay | Admitting: Physical Therapy

## 2022-04-25 ENCOUNTER — Ambulatory Visit: Payer: 59 | Admitting: Physical Therapy

## 2022-04-25 DIAGNOSIS — G8929 Other chronic pain: Secondary | ICD-10-CM

## 2022-04-25 DIAGNOSIS — R2689 Other abnormalities of gait and mobility: Secondary | ICD-10-CM | POA: Diagnosis not present

## 2022-04-25 DIAGNOSIS — M25561 Pain in right knee: Secondary | ICD-10-CM | POA: Diagnosis not present

## 2022-04-25 DIAGNOSIS — M25562 Pain in left knee: Secondary | ICD-10-CM | POA: Diagnosis not present

## 2022-04-25 DIAGNOSIS — M5459 Other low back pain: Secondary | ICD-10-CM

## 2022-04-25 DIAGNOSIS — M6281 Muscle weakness (generalized): Secondary | ICD-10-CM | POA: Diagnosis not present

## 2022-04-26 ENCOUNTER — Ambulatory Visit
Admission: RE | Admit: 2022-04-26 | Discharge: 2022-04-26 | Disposition: A | Discharge status: Home / Self Care | Payer: 59 | Source: Ambulatory Visit | Attending: Family Medicine | Admitting: Family Medicine

## 2022-04-26 DIAGNOSIS — Z1231 Encounter for screening mammogram for malignant neoplasm of breast: Secondary | ICD-10-CM

## 2022-04-27 ENCOUNTER — Encounter: Payer: Self-pay | Admitting: Physical Therapy

## 2022-04-27 ENCOUNTER — Ambulatory Visit: Payer: 59 | Admitting: Physical Therapy

## 2022-04-27 DIAGNOSIS — M5459 Other low back pain: Secondary | ICD-10-CM | POA: Diagnosis not present

## 2022-04-27 DIAGNOSIS — M6281 Muscle weakness (generalized): Secondary | ICD-10-CM

## 2022-04-27 DIAGNOSIS — R2689 Other abnormalities of gait and mobility: Secondary | ICD-10-CM

## 2022-04-27 DIAGNOSIS — M25562 Pain in left knee: Secondary | ICD-10-CM | POA: Diagnosis not present

## 2022-04-27 DIAGNOSIS — G8929 Other chronic pain: Secondary | ICD-10-CM

## 2022-04-27 DIAGNOSIS — M25561 Pain in right knee: Secondary | ICD-10-CM | POA: Diagnosis not present

## 2022-04-27 NOTE — Therapy (Signed)
OUTPATIENT PHYSICAL THERAPY TREATMENT NOTE   Patient Name: Katherine Tanner MRN: [id number] DOB:1960-10-30, 61 y.o., female Today's Date: 04/27/2022  PCP: Abner Greenspan, MD   REFERRING PROVIDER: Margaretha Sheffield, MD  END OF SESSION:   PT End of Session - 04/27/22 1418     Visit Number 5    Number of Visits 17    Date for PT Re-Evaluation 06/06/22    Authorization Type  employee    Authorization Time Period auth required after 25th visit    Authorization - Visit Number 5    Authorization - Number of Visits 25    PT Start Time 7591    PT Stop Time 1500    PT Time Calculation (min) 42 min    Activity Tolerance Patient tolerated treatment well;No increased pain    Behavior During Therapy WFL for tasks assessed/performed                Past Medical History:  Diagnosis Date   Allergy    takes Claritin daily and Flonase daily   Arthritis    Asthma    uses Albuterol daily as needed;Singulair nightly;DUlera daily   Back pain    Constipation    Fatty liver    Food allergy    GERD (gastroesophageal reflux disease)    occasionally will take OTC meds but states she just watches what she eats   H/O hiatal hernia    History of bronchitis    last time 58yr ago   History of colon polyps    History of kidney stones    History of shingles    Hyperlipidemia    Hypertension    takes Hyzaar daily   Joint pain    Joint pain    Joint swelling    Obesity    Osteoarthritis    Pneumonia    hx of;last time at least 89yrago   PONV (postoperative nausea and vomiting)    Prediabetes    Shortness of breath    Sleep apnea    Swelling of lower extremity    Trouble in sleeping    Wheezing    Past Surgical History:  Procedure Laterality Date   BREAST EXCISIONAL BIOPSY Right    CARDIAC CATHETERIZATION     COLONOSCOPY     ESOPHAGOGASTRODUODENOSCOPY     KNEE SURGERY Right    arthroscopy   Right ganglion cyst     x 2   right lumpectomy  around 21[place] 11/15/2013   Procedure: Marcaine/STEROID INJECTION;  Surgeon: JoAlta CorningMD;  [facility];  Laterality: Left;   TOTAL KNEE ARTHROPLASTY Right 11/15/2013   Procedure: RIGHT TOTAL KNEE ARTHROPLASTY;  Surgeon: JoAlta CorningMD;  [facility];  Laterality: Right;   TOTAL KNEE ARTHROPLASTY Left 12/30/2016   Procedure: TOTAL KNEE ARTHROPLASTY;  Surgeon: GrDorna LeitzMD;  [place] [facility];  Laterality: Left;   TUBAL LIGATION     Patient Active Problem List   Diagnosis Date Noted   Chronic pain syndrome 02/09/2022   Left ankle pain 01/12/2021   Overweight with body mass index (BMI) of 29 to 29.9 in adult 08/17/2020   At risk for diabetes mellitus 0163/84/6659 Nonalcoholic hepatosteatosis 0193/57/0177 At risk for heart disease 07/06/2020   Transaminitis 04/07/2020   Thrombocytopenia ([facility]01/10/2019   Insulin resistance 06/19/2019   Vitamin D deficiency 11/05/2018   Obstructive sleep  apnea 10/10/2017   Primary osteoarthritis of left knee 12/30/2016   Class 1 obesity with serious comorbidity and body mass index (BMI) of 34.0 to 34.9 in adult 05/24/2016   Prediabetes 02/24/2014   Osteoarthritis of right knee 11/15/2013   Osteoarthritis of left knee 11/15/2013   Alkaline phosphatase elevation 01/02/2012   Hyperlipidemia 01/02/2012   Routine general medical examination at a health care facility 12/09/2011   Allergy to influenza vaccine 05/02/2011   POLYARTHRITIS 01/28/2010   Chronic pain of both knees 12/11/2009   ANGIOEDEMA 03/27/2007   Essential hypertension 03/19/2007   Perennial allergic rhinitis with seasonal variation 03/19/2007   Asthma, moderate persistent 03/19/2007   GERD 03/19/2007   HIATAL HERNIA 03/19/2007   GESTATIONAL DIABETES 03/19/2007    REFERRING DIAG:  "LBP, B knee pain, lumbar spondylosis, h/o B TKA"  THERAPY DIAG:  Other low back pain  Chronic pain of left  knee  Chronic pain of right knee  Other abnormalities of gait and mobility  Muscle weakness (generalized)  Rationale for Evaluation and Treatment Rehabilitation  PERTINENT HISTORY: Asthma, GERD, HTN, hx hiatal hernia  PRECAUTIONS: none  SUBJECTIVE:  "I am still having pain in the L low back which seems to be worse after working. I notice my foot has been bothering me."   PAIN:  Are you having pain: yes, 5810 Location: back and B knees, right around sacrum How would you describe your pain? Achey, occasional sharp pains Best in past week: 4/10 Worst in past week: 10/10 (takes a couple hours to settle) Aggravating factors: prolonged walking/standing, difficulty bending/lifting, stair navigation Easing factors: medication, ice, movement/stretches, TENS   OBJECTIVE: (objective measures completed at initial evaluation unless otherwise dated)   DIAGNOSTIC FINDINGS:  Per 03/08/22 lumbar MRI:  IMPRESSION: Mild, facet predominant degenerative changes as detailed above are similar to the prior study. No significant stenosis.   Possible fibroid uterus. This could be further evaluated with ultrasound.   PATIENT SURVEYS:  FOTO 59%   SCREENING FOR RED FLAGS: Red flag questioning unremarkable   COGNITION:           Overall cognitive status: Within functional limits for tasks assessed                          SENSATION: Light touch intact B LE, some increased sensitivity R medial knee pt states has been present since TKA     POSTURE: No Significant postural limitations   PALPATION: Tightness/TTP B quads, more notable medially TTP B QL and lumbar paraspinals, L more than R. TTP L glutes/piriformis. Concordant discomfort with grade 1-2 PA SI mobilizations, no change with repetition     LUMBAR ROM:    Active  A/PROM  eval  Flexion Mid shin, p!  Extension 75%, mild tenderness   Right lateral flexion    Left lateral flexion    Right rotation 75%  Left rotation 75% more  pull   (Blank rows = not tested) Comment: able to achieve ~3 more inches of flexion after 5 reps repeated extension, about same amount of pain   LOWER EXTREMITY ROM:      Active  Right eval Left eval  Hip flexion      Hip extension      Hip internal rotation      Hip external rotation       (Blank rows = not tested)   Comments:     LOWER EXTREMITY MMT:     MMT Right  eval Left eval  Hip flexion 4 4  Hip abduction (modified sitting) 5 5  Hip internal rotation 4 4  Hip external rotation 4- 4-  Knee flexion 5 5  Knee extension 4+ 4+   (Blank rows = not tested)   Comments: mild pain in B knees with extension and hip ER     FUNCTIONAL TESTS:  5xSTS standard chair: 17sec no UE support, reduced velocity with repetition   GAIT: Distance walked: within clinic Assistive device utilized: None Level of assistance: Complete Independence Comments: reduced step length B, reduced hip ext, reduced truncal ROM and arm swing B, reduced gait speed and cadence       TODAY'S TREATMENT   [facility] Adult PT Treatment:                                                DATE: 04/27/2022 Therapeutic Exercise: SLR 3 x 10 LLE only Hamstring stretch 3 x 30 secon with 10 sec contract/ relax LTR 2 x 10 performed between sets of SLR Nu-step L 5 x 5 min UE/LE Seated anterior plevic tilt 1 x 5 with abdominal draw in maneuver  Updated HEP for hamstring stretch/ SLR.  Manual Therapy: LAD grade IV  MTPR along L lumbar paraspinals (standing with tennis ball)   [facility] Adult PT Treatment:                                                DATE: 04/25/22 Therapeutic Exercise: Swiss ball flexion 10 each way (fwd and each side) cues for form STS 5# from low mat, 2x8 cues for trunk lean and weight shift Swiss ball press down fwd and lateral, x10 each way, cues for reduced compensations at shoulders Step ups 4inch w/ ipsilateral DB 5#, 3x5 each LE cues for sagittal plane mechanics, posture, and reduced velocity   Heel raises at counter 2x10 Cybex hip abd 12.5# x10 each LE, cues for hand support, posture, and reduced trunk lean Manual Therapy: Prone, STM B QL and lumbar paraspinals, trigger point release distal Qls B Prone, STM B gluteals/piriformis, L>R Passive QL stretch prone B 18mn   [facility] Adult PT Treatment:                                                DATE: 04-21-22 Therapeutic Exercise: Seated lumbar flexion x10, x10 3 way STS 2x10 from chair, cues for increased fwd trunk lean Seated LAQ  2x10 BLE Heel raises w UE support x10  PPT x 20 Trunk rotation 5 x 20 sec hold to R and to L Bridge with ball x 20   Supine Piriformis Stretch with Foot on Ground  3 x 30 sec hold on R and L  Manual Therapy STW QL myofascial release STW of bil gluteals and lumbar Trigger Point Dry Needling Treatment: Pre-treatment instruction: Patient instructed on dry needling rationale, procedures, and possible side effects including pain during treatment (achy,cramping feeling), bruising, drop of blood, lightheadedness, nausea, sweating. Patient Consent Given: Yes Education handout provided: Yes Muscles treated: BIL QL, lumbar 3-S-1 bil , gluteals along iliac crest  Needle size and number: .30x49m x 2  .40 with 105 mm x 2 Electrical stimulation performed: No Parameters: N/A Treatment response/outcome: Twitch response elicited and Palpable decrease in muscle tension Post-treatment instructions: Patient instructed to expect possible mild to moderate muscle soreness later today and/or tomorrow. Patient instructed in methods to reduce muscle soreness and to continue prescribed HEP. If patient was dry needled over the lung field, patient was instructed on signs and symptoms of pneumothorax and, however unlikely, to see immediate medical attention should they occur. Patient was also educated on signs and symptoms of infection and to seek medical attention should they occur. Patient verbalized understanding of these  instructions and education.   Modalities: Moist hot pack concurrent with supine exercise    PATIENT EDUCATION:  Education details: Pt education on PT impairments, prognosis, and POC. Rationale for interventions, safe/appropriate HEP performance Person educated: Patient Education method: Explanation, Demonstration, Tactile cues, Verbal cues Education comprehension: verbalized understanding, returned demonstration, verbal cues required, tactile cues required, and needs further education      HOME EXERCISE PROGRAM: Access Code: [email or web address] URL: [email or web address] Date: 04/27/2022 Prepared by: [place] Exercises - Sit to Stand with Armchair  - 1 x daily - 7 x weekly - 3 sets - 10 reps - Seated Flexion Stretch  - 1 x daily - 7 x weekly - 3 sets - 10 reps - Supine Posterior Pelvic Tilt  - 1 x daily - 7 x weekly - 1 sets - 10 reps - 10 sec hold hold - Supine Bridge with Mini Swiss Ball Between Knees  - 1 x daily - 7 x weekly - 3 sets - 10 reps - Supine Lower Trunk Rotation  - 1 x daily - 7 x weekly - 1 sets - 5 reps - 20 sec  hold - Supine Piriformis Stretch with Foot on Ground  - 1 x daily - 7 x weekly - 1 sets - 3 reps - 30sec hold - Seated Hamstring Stretch  - 1 x daily - 7 x weekly - 2 sets - 2 reps - 30 hold - Supine Hamstring Stretch with Strap  - 1 x daily - 7 x weekly - 2 sets - 2 reps - 30 hold - SLR  - 1 x daily - 7 x weekly - 3 sets - 10 reps - 1 hold   ASSESSMENT:   CLINICAL IMPRESSION: Pt arrived to session noting 8/10 pain today noting she is always worse after a full day of work. Time was taken to assess her back and demonstrated positive findings suggesting L SIJ involvement. Treated for l SIJ anterior deficit which she responded well following hamstring stretching and hip flexor activation. Reviewed and updated HEP and discussed how she can perform MTPR at home using tennis balls. Continued working on posture education sitting with anterior pelvic  tilt. End of session she noted pain dropped from 8/10 to a 4/10       OBJECTIVE IMPAIRMENTS Abnormal gait, decreased activity tolerance, decreased balance, decreased endurance, decreased mobility, difficulty walking, decreased ROM, decreased strength, hypomobility, impaired flexibility, and pain.    ACTIVITY LIMITATIONS carrying, lifting, bending, sitting, standing, squatting, stairs, transfers, and dressing   PARTICIPATION LIMITATIONS: meal prep, cleaning, laundry, driving, shopping, community activity, and occupation   PERSONAL FACTORS Time since onset of injury/illness/exacerbation and 1-2 comorbidities: HTN, asthma  are also affecting patient's functional outcome.    REHAB POTENTIAL: Good   CLINICAL DECISION MAKING: Evolving/moderate complexity   EVALUATION COMPLEXITY: Moderate  GOALS: Goals reviewed with patient? No due to time constraints   SHORT TERM GOALS: Target date: 05/09/2022   Pt will demonstrate appropriate understanding and performance of initially prescribed HEP in order to facilitate improved independence with management of symptoms.  Baseline: HEP provided on eval 04-21-22  HEP updated Goal status: ONGOING    2. Pt will score greater than or equal to 64 on FOTO in order to demonstrate improved perception of function due to symptoms.            Baseline: 59            Goal status: ONGOING   LONG TERM GOALS: Target date: 06/06/2022   Pt will score 68 on FOTO in order to demonstrate improved perception of functional status due to symptoms.  Baseline: 59 Goal status: INITIAL   2.  Pt will demonstrate 100% lumbar flexion AROM with less than 3/10 pain on NPS in order to demonstrate improved tolerance to typical ADLs such as lower body dressing and lifting.  Baseline: see ROM chart Goal status: INITIAL   3.  Pt will demonstrate hip IR/ER MMT of 4+ /5 in order to demonstrate improved safety/stability with functional transfers. Baseline: see MMT chart Goal  status: INITIAL   4. Pt will perform 5xSTS in <12 sec in order to demonstrate reduced fall risk and improved functional independence. (MCID of 2.3sec)            Baseline: 17sec            Goal status: INITIAL      PLAN: PT FREQUENCY: 1-2x/week   PT DURATION: 8 weeks   PLANNED INTERVENTIONS: Therapeutic exercises, Therapeutic activity, Neuromuscular re-education, Balance training, Gait training, Patient/Family education, Self Care, Joint mobilization, Stair training, Aquatic Therapy, Dry Needling, Electrical stimulation, Spinal mobilization, Cryotherapy, Moist heat, Manual therapy, and Re-evaluation.   PLAN FOR NEXT SESSION: Progress ROM/strengthening exercises as able/appropriate, review/update HEP as appropriate. Would likely benefit from increased time spent in WB, response to L innominate anterior deficit.    Sheng Pritz PT, DPT, LAT, [name]  04/27/22  3:24 PM

## 2022-04-29 ENCOUNTER — Other Ambulatory Visit: Payer: Self-pay | Admitting: Family Medicine

## 2022-04-29 DIAGNOSIS — R928 Other abnormal and inconclusive findings on diagnostic imaging of breast: Secondary | ICD-10-CM

## 2022-05-04 ENCOUNTER — Ambulatory Visit: Payer: 59 | Attending: Family Medicine

## 2022-05-04 DIAGNOSIS — G8929 Other chronic pain: Secondary | ICD-10-CM | POA: Diagnosis not present

## 2022-05-04 DIAGNOSIS — R2689 Other abnormalities of gait and mobility: Secondary | ICD-10-CM | POA: Insufficient documentation

## 2022-05-04 DIAGNOSIS — M25562 Pain in left knee: Secondary | ICD-10-CM | POA: Insufficient documentation

## 2022-05-04 DIAGNOSIS — M25561 Pain in right knee: Secondary | ICD-10-CM | POA: Insufficient documentation

## 2022-05-04 DIAGNOSIS — M5459 Other low back pain: Secondary | ICD-10-CM | POA: Diagnosis not present

## 2022-05-04 DIAGNOSIS — M6281 Muscle weakness (generalized): Secondary | ICD-10-CM | POA: Insufficient documentation

## 2022-05-04 NOTE — Therapy (Signed)
OUTPATIENT PHYSICAL THERAPY TREATMENT NOTE   Patient Name: Katherine Tanner MRN: [id number] DOB:10-26-1960, 61 y.o., female Today's Date: 05/04/2022  PCP: Abner Greenspan, MD   REFERRING PROVIDER: Margaretha Sheffield, MD  END OF SESSION:   PT End of Session - 05/04/22 1407     Visit Number 6    Number of Visits 17    Date for PT Re-Evaluation 06/06/22    Authorization Type [employer] employee    Authorization Time Period auth required after 25th visit    Authorization - Visit Number 6    Authorization - Number of Visits 25    Progress Note Due on Visit 10    PT Start Time 1410    PT Stop Time 1450    PT Time Calculation (min) 40 min    Activity Tolerance Patient tolerated treatment well;No increased pain    Behavior During Therapy WFL for tasks assessed/performed                 Past Medical History:  Diagnosis Date   Allergy    takes Claritin daily and Flonase daily   Arthritis    Asthma    uses Albuterol daily as needed;Singulair nightly;DUlera daily   Back pain    Constipation    Fatty liver    Food allergy    GERD (gastroesophageal reflux disease)    occasionally will take OTC meds but states she just watches what she eats   H/O hiatal hernia    History of bronchitis    last time 53yr ago   History of colon polyps    History of kidney stones    History of shingles    Hyperlipidemia    Hypertension    takes Hyzaar daily   Joint pain    Joint pain    Joint swelling    Obesity    Osteoarthritis    Pneumonia    hx of;last time at least 244yrago   PONV (postoperative nausea and vomiting)    Prediabetes    Shortness of breath    Sleep apnea    Swelling of lower extremity    Trouble in sleeping    Wheezing    Past Surgical History:  Procedure Laterality Date   BREAST EXCISIONAL BIOPSY Right    CARDIAC CATHETERIZATION     COLONOSCOPY     ESOPHAGOGASTRODUODENOSCOPY     KNEE SURGERY Right    arthroscopy   Right ganglion cyst     x 2    right lumpectomy  around [address] 11/15/2013   Procedure: Marcaine/STEROID INJECTION;  Surgeon: JoAlta CorningMD;  [facility]: [facility]ics;  Laterality: Left;   TOTAL KNEE ARTHROPLASTY Right 11/15/2013   Procedure: RIGHT TOTAL KNEE ARTHROPLASTY;  Surgeon: JoAlta CorningMD;  [facility]: [facility]opedics;  Laterality: Right;   TOTAL KNEE ARTHROPLASTY Left 12/30/2016   Procedure: TOTAL KNEE ARTHROPLASTY;  Surgeon: GrDorna LeitzMD;  [facility]: [facility];  Laterality: Left;   TUBAL LIGATION     Patient Active Problem List   Diagnosis Date Noted   Chronic pain syndrome 02/09/2022   Left ankle pain 01/12/2021   Overweight with body mass index (BMI) of 29 to 29.9 in adult 08/17/2020   At risk for diabetes mellitus 0181/27/5170 Nonalcoholic hepatosteatosis 0101/74/9449 At risk for heart disease 07/06/2020   Transaminitis 04/07/2020   Thrombocytopenia ([facility]01/10/2019   Insulin resistance 06/19/2019  Vitamin D deficiency 11/05/2018   Obstructive sleep apnea 10/10/2017   Primary osteoarthritis of left knee 12/30/2016   Class 1 obesity with serious comorbidity and body mass index (BMI) of 34.0 to 34.9 in adult 05/24/2016   Prediabetes 02/24/2014   Osteoarthritis of right knee 11/15/2013   Osteoarthritis of left knee 11/15/2013   Alkaline phosphatase elevation 01/02/2012   Hyperlipidemia 01/02/2012   Routine general medical examination at a health care facility 12/09/2011   Allergy to influenza vaccine 05/02/2011   POLYARTHRITIS 01/28/2010   Chronic pain of both knees 12/11/2009   ANGIOEDEMA 03/27/2007   Essential hypertension 03/19/2007   Perennial allergic rhinitis with seasonal variation 03/19/2007   Asthma, moderate persistent 03/19/2007   GERD 03/19/2007   HIATAL HERNIA 03/19/2007   GESTATIONAL DIABETES 03/19/2007    REFERRING DIAG:  "LBP, B knee pain, lumbar spondylosis, h/o B TKA"  THERAPY DIAG:  Other low back  pain  Chronic pain of left knee  Chronic pain of right knee  Other abnormalities of gait and mobility  Muscle weakness (generalized)  Rationale for Evaluation and Treatment Rehabilitation  PERTINENT HISTORY: Asthma, GERD, HTN, hx hiatal hernia  PRECAUTIONS: none  SUBJECTIVE:  Pt reports increased back, neck, and BIL knee pain today, which she attributes to going back to work after a week hiatus, as well as the weather. She reports adherence to her HEP.   PAIN:  Are you having pain: yes, 8-9/10 Location: back and B knees, right around sacrum How would you describe your pain? Achey, occasional sharp pains Best in past week: 4/10 Worst in past week: 10/10 (takes a couple hours to settle) Aggravating factors: prolonged walking/standing, difficulty bending/lifting, stair navigation Easing factors: medication, ice, movement/stretches, TENS   OBJECTIVE: (objective measures completed at initial evaluation unless otherwise dated)   DIAGNOSTIC FINDINGS:  Per 03/08/22 lumbar MRI:  IMPRESSION: Mild, facet predominant degenerative changes as detailed above are similar to the prior study. No significant stenosis.   Possible fibroid uterus. This could be further evaluated with ultrasound.   PATIENT SURVEYS:  FOTO 59% 05/04/2022: 52%   SCREENING FOR RED FLAGS: Red flag questioning unremarkable   COGNITION:           Overall cognitive status: Within functional limits for tasks assessed                          SENSATION: Light touch intact B LE, some increased sensitivity R medial knee pt states has been present since TKA     POSTURE: No Significant postural limitations   PALPATION: Tightness/TTP B quads, more notable medially TTP B QL and lumbar paraspinals, L more than R. TTP L glutes/piriformis. Concordant discomfort with grade 1-2 PA SI mobilizations, no change with repetition     LUMBAR ROM:    Active  A/PROM  eval  Flexion Mid shin, p!  Extension 75%, mild  tenderness   Right lateral flexion    Left lateral flexion    Right rotation 75%  Left rotation 75% more pull   (Blank rows = not tested) Comment: able to achieve ~3 more inches of flexion after 5 reps repeated extension, about same amount of pain   LOWER EXTREMITY ROM:      Active  Right eval Left eval  Hip flexion      Hip extension      Hip internal rotation      Hip external rotation       (Blank rows =  not tested)   Comments:     LOWER EXTREMITY MMT:     MMT Right eval Left eval  Hip flexion 4 4  Hip abduction (modified sitting) 5 5  Hip internal rotation 4 4  Hip external rotation 4- 4-  Knee flexion 5 5  Knee extension 4+ 4+   (Blank rows = not tested)   Comments: mild pain in B knees with extension and hip ER     FUNCTIONAL TESTS:  5xSTS standard chair: 17sec no UE support, reduced velocity with repetition   GAIT: Distance walked: within clinic Assistive device utilized: None Level of assistance: Complete Independence Comments: reduced step length B, reduced hip ext, reduced truncal ROM and arm swing B, reduced gait speed and cadence       TODAY'S TREATMENT   [facility] Adult PT Treatment:                                                DATE: 05/04/2022 Therapeutic Exercise: Supine 90/90 mini bicycle kicks with handhold resistance through thighs 3x16 Hooklying LTR 2x10 Thomas stretch x31mn BIL Rt forward lunge rocking to table for SIJ directional preference 3x20 10# kettlebell deadlifts 3x8 Seated Lt piriformis stretch 2x165m Manual Therapy: Manual Lt hip lateral distraction [id number] mobilization belt Manual Lt hip long-axis distraction [id number] re-ed: N/A Therapeutic Activity: N/A Modalities: N/A Self Care: N/A   [facility] Adult PT Treatment:                                                DATE: 04/27/2022 Therapeutic Exercise: SLR 3 x 10 LLE only Hamstring stretch 3 x 30 secon with 10 sec contract/ relax LTR 2 x 10 performed between  sets of SLR Nu-step L 5 x 5 min UE/LE Seated anterior plevic tilt 1 x 5 with abdominal draw in maneuver  Updated HEP for hamstring stretch/ SLR.  Manual Therapy: LAD grade IV  MTPR along L lumbar paraspinals (standing with tennis ball)   [facility] Adult PT Treatment:                                                DATE: 04/25/22 Therapeutic Exercise: Swiss ball flexion 10 each way (fwd and each side) cues for form STS 5# from low mat, 2x8 cues for trunk lean and weight shift Swiss ball press down fwd and lateral, x10 each way, cues for reduced compensations at shoulders Step ups 4inch w/ ipsilateral DB 5#, 3x5 each LE cues for sagittal plane mechanics, posture, and reduced velocity  Heel raises at counter 2x10 Cybex hip abd 12.5# x10 each LE, cues for hand support, posture, and reduced trunk lean Manual Therapy: Prone, STM B QL and lumbar paraspinals, trigger point release distal Qls B Prone, STM B gluteals/piriformis, L>R Passive QL stretch prone B 5min65m   PATIENT EDUCATION:  Education details: Pt education on PT impairments, prognosis, and POC. Rationale for interventions, safe/appropriate HEP performance Person educated: Patient Education method: Explanation, Demonstration, Tactile cues, Verbal cues Education comprehension: verbalized understanding, returned demonstration, verbal cues required, tactile cues required, and  needs further education      HOME EXERCISE PROGRAM: Access Code: [email or web address] URL: [email or web address] Date: 04/27/2022 Prepared by: Starr Lake  Exercises - Sit to Stand with Armchair  - 1 x daily - 7 x weekly - 3 sets - 10 reps - Seated Flexion Stretch  - 1 x daily - 7 x weekly - 3 sets - 10 reps - Supine Posterior Pelvic Tilt  - 1 x daily - 7 x weekly - 1 sets - 10 reps - 10 sec hold hold - Supine Bridge with Mini Swiss Ball Between Knees  - 1 x daily - 7 x weekly - 3 sets - 10 reps - Supine Lower Trunk Rotation  - 1 x daily - 7 x  weekly - 1 sets - 5 reps - 20 sec  hold - Supine Piriformis Stretch with Foot on Ground  - 1 x daily - 7 x weekly - 1 sets - 3 reps - 30sec hold - Seated Hamstring Stretch  - 1 x daily - 7 x weekly - 2 sets - 2 reps - 30 hold - Supine Hamstring Stretch with Strap  - 1 x daily - 7 x weekly - 2 sets - 2 reps - 30 hold - SLR  - 1 x daily - 7 x weekly - 3 sets - 10 reps - 1 hold   ASSESSMENT:   CLINICAL IMPRESSION: Pt responded very well to all interventions today, demonstrating good form and no pain with new and progressed exercises. On follow-up to SIJ testing at last treatment, further assessment was performed which indicated a directional preference in Rt innominate counter-nutation. She reports that exercises were therapeutic today, reporting decrease in pain from 8-9/10 to 6/10 to end the session. She will continue to benefit from skilled PT to address her primary impairments and return to her prior level of function with less limitation.      OBJECTIVE IMPAIRMENTS Abnormal gait, decreased activity tolerance, decreased balance, decreased endurance, decreased mobility, difficulty walking, decreased ROM, decreased strength, hypomobility, impaired flexibility, and pain.    ACTIVITY LIMITATIONS carrying, lifting, bending, sitting, standing, squatting, stairs, transfers, and dressing   PARTICIPATION LIMITATIONS: meal prep, cleaning, laundry, driving, shopping, community activity, and occupation   PERSONAL FACTORS Time since onset of injury/illness/exacerbation and 1-2 comorbidities: HTN, asthma  are also affecting patient's functional outcome.      GOALS: Goals reviewed with patient? No due to time constraints   SHORT TERM GOALS: Target date: 05/09/2022   Pt will demonstrate appropriate understanding and performance of initially prescribed HEP in order to facilitate improved independence with management of symptoms.  Baseline: HEP provided on eval 04-21-22  HEP updated Goal status: ONGOING     2. Pt will score greater than or equal to 64 on FOTO in order to demonstrate improved perception of function due to symptoms.            Baseline: 59  05/04/2022: 52            Goal status: ONGOING   LONG TERM GOALS: Target date: 06/06/2022   Pt will score 68 on FOTO in order to demonstrate improved perception of functional status due to symptoms.  Baseline: 59 05/04/2022: 52 Goal status: IN PROGRESS   2.  Pt will demonstrate 100% lumbar flexion AROM with less than 3/10 pain on NPS in order to demonstrate improved tolerance to typical ADLs such as lower body dressing and lifting.  Baseline: see ROM chart Goal status: INITIAL   3.  Pt  will demonstrate hip IR/ER MMT of 4+ /5 in order to demonstrate improved safety/stability with functional transfers. Baseline: see MMT chart Goal status: INITIAL   4. Pt will perform 5xSTS in <12 sec in order to demonstrate reduced fall risk and improved functional independence. (MCID of 2.3sec)            Baseline: 17sec            Goal status: INITIAL      PLAN: PT FREQUENCY: 1-2x/week   PT DURATION: 8 weeks   PLANNED INTERVENTIONS: Therapeutic exercises, Therapeutic activity, Neuromuscular re-education, Balance training, Gait training, Patient/Family education, Self Care, Joint mobilization, Stair training, Aquatic Therapy, Dry Needling, Electrical stimulation, Spinal mobilization, Cryotherapy, Moist heat, Manual therapy, and Re-evaluation.   PLAN FOR NEXT SESSION: Progress ROM/strengthening exercises as able/appropriate, review/update HEP as appropriate. Would likely benefit from increased time spent in WB, response to L innominate anterior deficit.   Vanessa Damascus, PT, DPT 05/04/22 2:53 PM

## 2022-05-05 ENCOUNTER — Other Ambulatory Visit: Payer: Self-pay | Admitting: Family Medicine

## 2022-05-05 ENCOUNTER — Encounter: Payer: Self-pay | Admitting: Family Medicine

## 2022-05-05 ENCOUNTER — Ambulatory Visit
Admission: RE | Admit: 2022-05-05 | Discharge: 2022-05-05 | Disposition: A | Discharge status: Home / Self Care | Payer: 59 | Source: Ambulatory Visit | Attending: Family Medicine | Admitting: Family Medicine

## 2022-05-05 DIAGNOSIS — R928 Other abnormal and inconclusive findings on diagnostic imaging of breast: Secondary | ICD-10-CM

## 2022-05-05 DIAGNOSIS — R921 Mammographic calcification found on diagnostic imaging of breast: Secondary | ICD-10-CM

## 2022-05-05 NOTE — Therapy (Signed)
OUTPATIENT PHYSICAL THERAPY TREATMENT NOTE   Patient Name: Katherine Tanner MRN: [id number] DOB:12/26/1960, 61 y.o., female Today's Date: 05/06/2022  PCP: Abner Greenspan, MD   REFERRING PROVIDER: Margaretha Sheffield, MD  END OF SESSION:   PT End of Session - 05/06/22 1322     Visit Number 7    Number of Visits 17    Date for PT Re-Evaluation 06/06/22    Authorization Type [employer] employee    Authorization Time Period auth required after 25th visit    Authorization - Visit Number 7    Authorization - Number of Visits 25    Progress Note Due on Visit 10    PT Start Time 7829    PT Stop Time 1402    PT Time Calculation (min) 40 min    Activity Tolerance Patient tolerated treatment well;No increased pain    Behavior During Therapy WFL for tasks assessed/performed                  Past Medical History:  Diagnosis Date   Allergy    takes Claritin daily and Flonase daily   Arthritis    Asthma    uses Albuterol daily as needed;Singulair nightly;DUlera daily   Back pain    Constipation    Fatty liver    Food allergy    GERD (gastroesophageal reflux disease)    occasionally will take OTC meds but states she just watches what she eats   H/O hiatal hernia    History of bronchitis    last time 24yr ago   History of colon polyps    History of kidney stones    History of shingles    Hyperlipidemia    Hypertension    takes Hyzaar daily   Joint pain    Joint pain    Joint swelling    Obesity    Osteoarthritis    Pneumonia    hx of;last time at least 89yrago   PONV (postoperative nausea and vomiting)    Prediabetes    Shortness of breath    Sleep apnea    Swelling of lower extremity    Trouble in sleeping    Wheezing    Past Surgical History:  Procedure Laterality Date   BREAST EXCISIONAL BIOPSY Right    CARDIAC CATHETERIZATION     COLONOSCOPY     ESOPHAGOGASTRODUODENOSCOPY     KNEE SURGERY Right    arthroscopy   Right ganglion cyst     x 2    right lumpectomy  around [place] 11/15/2013   Procedure: Marcaine/STEROID INJECTION;  Surgeon: JoAlta CorningMD;  [facility];  Laterality: Left;   TOTAL KNEE ARTHROPLASTY Right 11/15/2013   Procedure: RIGHT TOTAL KNEE ARTHROPLASTY;  Surgeon: JoAlta CorningMD;  [place] [facility] [place];  Laterality: Right;   TOTAL KNEE ARTHROPLASTY Left 12/30/2016   Procedure: TOTAL KNEE ARTHROPLASTY;  Surgeon: GrDorna LeitzMD;  [facility];  Laterality: Left;   TUBAL LIGATION     Patient Active Problem List   Diagnosis Date Noted   Chronic pain syndrome 02/09/2022   Left ankle pain 01/12/2021   Overweight with body mass index (BMI) of 29 to 29.9 in adult 08/17/2020   At risk for diabetes mellitus 0156/21/3086 Nonalcoholic hepatosteatosis 0157/84/6962 At risk for heart disease 07/06/2020   Transaminitis 04/07/2020   Thrombocytopenia ([facility]01/10/2019   Insulin resistance  06/19/2019   Vitamin D deficiency 11/05/2018   Obstructive sleep apnea 10/10/2017   Primary osteoarthritis of left knee 12/30/2016   Class 1 obesity with serious comorbidity and body mass index (BMI) of 34.0 to 34.9 in adult 05/24/2016   Prediabetes 02/24/2014   Osteoarthritis of right knee 11/15/2013   Osteoarthritis of left knee 11/15/2013   Alkaline phosphatase elevation 01/02/2012   Hyperlipidemia 01/02/2012   Routine general medical examination at a health care facility 12/09/2011   Allergy to influenza vaccine 05/02/2011   POLYARTHRITIS 01/28/2010   Chronic pain of both knees 12/11/2009   ANGIOEDEMA 03/27/2007   Essential hypertension 03/19/2007   Perennial allergic rhinitis with seasonal variation 03/19/2007   Asthma, moderate persistent 03/19/2007   GERD 03/19/2007   HIATAL HERNIA 03/19/2007   GESTATIONAL DIABETES 03/19/2007    REFERRING DIAG:  "LBP, B knee pain, lumbar spondylosis, h/o B TKA"  THERAPY DIAG:  Other low back  pain  Chronic pain of right knee  Other abnormalities of gait and mobility  Muscle weakness (generalized)  Chronic pain of left knee  Rationale for Evaluation and Treatment Rehabilitation  PERTINENT HISTORY: Asthma, GERD, HTN, hx hiatal hernia  PRECAUTIONS: none  SUBJECTIVE:  Pt arrives with report of soreness after work today, mild increase in LLE symptoms after last session but only lasted about a day. No other new updates    PAIN:  Are you having pain: yes, 7-8/10 Location: back and B knees, right around sacrum How would you describe your pain? Achey, occasional sharp pains Best in past week: 4/10 Worst in past week: 10/10 (takes a couple hours to settle) Aggravating factors: prolonged walking/standing, difficulty bending/lifting, stair navigation Easing factors: medication, ice, movement/stretches, TENS   OBJECTIVE: (objective measures completed at initial evaluation unless otherwise dated)   DIAGNOSTIC FINDINGS:  Per 03/08/22 lumbar MRI:  IMPRESSION: Mild, facet predominant degenerative changes as detailed above are similar to the prior study. No significant stenosis.   Possible fibroid uterus. This could be further evaluated with ultrasound.   PATIENT SURVEYS:  FOTO 59% 05/04/2022: 52%   SCREENING FOR RED FLAGS: Red flag questioning unremarkable   COGNITION:           Overall cognitive status: Within functional limits for tasks assessed                          SENSATION: Light touch intact B LE, some increased sensitivity R medial knee pt states has been present since TKA     POSTURE: No Significant postural limitations   PALPATION: Tightness/TTP B quads, more notable medially TTP B QL and lumbar paraspinals, L more than R. TTP L glutes/piriformis. Concordant discomfort with grade 1-2 PA SI mobilizations, no change with repetition     LUMBAR ROM:    Active  A/PROM  eval  Flexion Mid shin, p!  Extension 75%, mild tenderness   Right lateral  flexion    Left lateral flexion    Right rotation 75%  Left rotation 75% more pull   (Blank rows = not tested) Comment: able to achieve ~3 more inches of flexion after 5 reps repeated extension, about same amount of pain   LOWER EXTREMITY ROM:      Active  Right eval Left eval  Hip flexion      Hip extension      Hip internal rotation      Hip external rotation       (Blank rows = not tested)  Comments:     LOWER EXTREMITY MMT:     MMT Right eval Left eval  Hip flexion 4 4  Hip abduction (modified sitting) 5 5  Hip internal rotation 4 4  Hip external rotation 4- 4-  Knee flexion 5 5  Knee extension 4+ 4+   (Blank rows = not tested)   Comments: mild pain in B knees with extension and hip ER     FUNCTIONAL TESTS:  5xSTS standard chair: 17sec no UE support, reduced velocity with repetition   GAIT: Distance walked: within clinic Assistive device utilized: None Level of assistance: Complete Independence Comments: reduced step length B, reduced hip ext, reduced truncal ROM and arm swing B, reduced gait speed and cadence       TODAY'S TREATMENT  [facility] Adult PT Treatment:                                                DATE: 05/06/22 Therapeutic Exercise: Hooklying isometric hip flex w/ swiss ball 2x8 each LE, performed unilaterally, cues for form and pacing LTR x12 B Seated IR/ER w/ ball and GTB, 2x12 each way KB DL 10# 2x10, cues for improved KB path and hinge Single leg golfers hinge, unilat UE support, x8 each LE, cues for form and control  Manual Therapy: LAD grade 2-3 B w gentle oscillations to reduce muscle guarding   [facility] Adult PT Treatment:                                                DATE: 05/04/2022 Therapeutic Exercise: Supine 90/90 mini bicycle kicks with handhold resistance through thighs 3x16 Hooklying LTR 2x10 Thomas stretch x56mn BIL Rt forward lunge rocking to table for SIJ directional preference 3x20 10# kettlebell deadlifts 3x8 Seated Lt  piriformis stretch 2x145m Manual Therapy: Manual Lt hip lateral distraction [id number] mobilization belt Manual Lt hip long-axis distraction x3mi2m [facility] Adult PT Treatment:                                                DATE: 04/27/2022 Therapeutic Exercise: SLR 3 x 10 LLE only Hamstring stretch 3 x 30 secon with 10 sec contract/ relax LTR 2 x 10 performed between sets of SLR Nu-step L 5 x 5 min UE/LE Seated anterior plevic tilt 1 x 5 with abdominal draw in maneuver  Updated HEP for hamstring stretch/ SLR.  Manual Therapy: LAD grade IV  MTPR along L lumbar paraspinals (standing with tennis ball)      PATIENT EDUCATION:  Education details: Pt education on PT impairments, prognosis, and POC. Rationale for interventions, safe/appropriate HEP performance Person educated: Patient Education method: Explanation, Demonstration, Tactile cues, Verbal cues Education comprehension: verbalized understanding, returned demonstration, verbal cues required, tactile cues required, and needs further education      HOME EXERCISE PROGRAM: Access Code: [email or web address] URL: [email or web address] Date: 04/27/2022 Prepared by: KrisStarr Lakeercises - Sit to Stand with Armchair  - 1 x daily - 7 x weekly - 3 sets - 10 reps - Seated Flexion Stretch  - 1 x daily - 7  x weekly - 3 sets - 10 reps - Supine Posterior Pelvic Tilt  - 1 x daily - 7 x weekly - 1 sets - 10 reps - 10 sec hold hold - Supine Bridge with Mini Swiss Ball Between Knees  - 1 x daily - 7 x weekly - 3 sets - 10 reps - Supine Lower Trunk Rotation  - 1 x daily - 7 x weekly - 1 sets - 5 reps - 20 sec  hold - Supine Piriformis Stretch with Foot on Ground  - 1 x daily - 7 x weekly - 1 sets - 3 reps - 30sec hold - Seated Hamstring Stretch  - 1 x daily - 7 x weekly - 2 sets - 2 reps - 30 hold - Supine Hamstring Stretch with Strap  - 1 x daily - 7 x weekly - 2 sets - 2 reps - 30 hold - SLR  - 1 x daily - 7 x weekly - 3 sets - 10  reps - 1 hold   ASSESSMENT:   CLINICAL IMPRESSION: Pt arrives with report of increased pain after work. Session initiated with LAD B hip joints, initial discomfort but pt reports relief with inc time. Continued to emphasize lumbopelvic stability/activation with open and closed chain exercises, cues as needed for form. Pt denies any overt changes in pain on departure although she does report improved stiffness. Pt notes particular difficulty with postural stability challenges today and indicates she would like to improve that. Pt departs today's session in no acute distress, all voiced questions/concerns addressed appropriately from PT perspective.       OBJECTIVE IMPAIRMENTS Abnormal gait, decreased activity tolerance, decreased balance, decreased endurance, decreased mobility, difficulty walking, decreased ROM, decreased strength, hypomobility, impaired flexibility, and pain.    ACTIVITY LIMITATIONS carrying, lifting, bending, sitting, standing, squatting, stairs, transfers, and dressing   PARTICIPATION LIMITATIONS: meal prep, cleaning, laundry, driving, shopping, community activity, and occupation   PERSONAL FACTORS Time since onset of injury/illness/exacerbation and 1-2 comorbidities: HTN, asthma  are also affecting patient's functional outcome.      GOALS: Goals reviewed with patient? No due to time constraints   SHORT TERM GOALS: Target date: 05/09/2022   Pt will demonstrate appropriate understanding and performance of initially prescribed HEP in order to facilitate improved independence with management of symptoms.  Baseline: HEP provided on eval 04-21-22  HEP updated Goal status: ONGOING    2. Pt will score greater than or equal to 64 on FOTO in order to demonstrate improved perception of function due to symptoms.            Baseline: 59  05/04/2022: 52            Goal status: ONGOING   LONG TERM GOALS: Target date: 06/06/2022   Pt will score 68 on FOTO in order to demonstrate  improved perception of functional status due to symptoms.  Baseline: 59 05/04/2022: 52 Goal status: IN PROGRESS   2.  Pt will demonstrate 100% lumbar flexion AROM with less than 3/10 pain on NPS in order to demonstrate improved tolerance to typical ADLs such as lower body dressing and lifting.  Baseline: see ROM chart Goal status: INITIAL   3.  Pt will demonstrate hip IR/ER MMT of 4+ /5 in order to demonstrate improved safety/stability with functional transfers. Baseline: see MMT chart Goal status: INITIAL   4. Pt will perform 5xSTS in <12 sec in order to demonstrate reduced fall risk and improved functional independence. (MCID of 2.3sec)  Baseline: 17sec            Goal status: INITIAL      PLAN: PT FREQUENCY: 1-2x/week   PT DURATION: 8 weeks   PLANNED INTERVENTIONS: Therapeutic exercises, Therapeutic activity, Neuromuscular re-education, Balance training, Gait training, Patient/Family education, Self Care, Joint mobilization, Stair training, Aquatic Therapy, Dry Needling, Electrical stimulation, Spinal mobilization, Cryotherapy, Moist heat, Manual therapy, and Re-evaluation.   PLAN FOR NEXT SESSION:  Progress ROM/strengthening exercises as able/appropriate, review/update HEP as appropriate. Would likely benefit from increased time spent in WB, response to L innominate anterior deficit.   Leeroy Cha PT, DPT 05/06/2022 2:05 PM

## 2022-05-06 ENCOUNTER — Encounter: Payer: Self-pay | Admitting: Physical Therapy

## 2022-05-06 ENCOUNTER — Ambulatory Visit: Payer: 59 | Admitting: Physical Therapy

## 2022-05-06 DIAGNOSIS — M5459 Other low back pain: Secondary | ICD-10-CM | POA: Diagnosis not present

## 2022-05-06 DIAGNOSIS — R2689 Other abnormalities of gait and mobility: Secondary | ICD-10-CM

## 2022-05-06 DIAGNOSIS — G8929 Other chronic pain: Secondary | ICD-10-CM | POA: Diagnosis not present

## 2022-05-06 DIAGNOSIS — M25561 Pain in right knee: Secondary | ICD-10-CM | POA: Diagnosis not present

## 2022-05-06 DIAGNOSIS — M6281 Muscle weakness (generalized): Secondary | ICD-10-CM

## 2022-05-06 DIAGNOSIS — M25562 Pain in left knee: Secondary | ICD-10-CM | POA: Diagnosis not present

## 2022-05-09 ENCOUNTER — Ambulatory Visit (INDEPENDENT_AMBULATORY_CARE_PROVIDER_SITE_OTHER): Payer: 59 | Admitting: Family Medicine

## 2022-05-09 ENCOUNTER — Encounter: Payer: Self-pay | Admitting: Physical Therapy

## 2022-05-09 ENCOUNTER — Encounter (INDEPENDENT_AMBULATORY_CARE_PROVIDER_SITE_OTHER): Payer: Self-pay | Admitting: Family Medicine

## 2022-05-09 ENCOUNTER — Ambulatory Visit: Payer: 59 | Admitting: Physical Therapy

## 2022-05-09 VITALS — BP 148/61 | HR 82 | Temp 98.0°F | Ht 61.0 in | Wt 152.0 lb

## 2022-05-09 DIAGNOSIS — E669 Obesity, unspecified: Secondary | ICD-10-CM | POA: Diagnosis not present

## 2022-05-09 DIAGNOSIS — R2689 Other abnormalities of gait and mobility: Secondary | ICD-10-CM

## 2022-05-09 DIAGNOSIS — G8929 Other chronic pain: Secondary | ICD-10-CM

## 2022-05-09 DIAGNOSIS — M5459 Other low back pain: Secondary | ICD-10-CM

## 2022-05-09 DIAGNOSIS — I10 Essential (primary) hypertension: Secondary | ICD-10-CM | POA: Diagnosis not present

## 2022-05-09 DIAGNOSIS — R7303 Prediabetes: Secondary | ICD-10-CM | POA: Diagnosis not present

## 2022-05-09 DIAGNOSIS — M6281 Muscle weakness (generalized): Secondary | ICD-10-CM

## 2022-05-09 DIAGNOSIS — M25561 Pain in right knee: Secondary | ICD-10-CM | POA: Diagnosis not present

## 2022-05-09 DIAGNOSIS — Z6828 Body mass index (BMI) 28.0-28.9, adult: Secondary | ICD-10-CM | POA: Diagnosis not present

## 2022-05-09 DIAGNOSIS — E559 Vitamin D deficiency, unspecified: Secondary | ICD-10-CM | POA: Diagnosis not present

## 2022-05-09 DIAGNOSIS — M25562 Pain in left knee: Secondary | ICD-10-CM | POA: Diagnosis not present

## 2022-05-09 NOTE — Therapy (Signed)
OUTPATIENT PHYSICAL THERAPY TREATMENT NOTE   Patient Name: Lucy Boardman MRN: [id number] DOB:27-May-1961, 61 y.o., female Today's Date: 05/09/2022  PCP: Abner Greenspan, MD   REFERRING PROVIDER: Margaretha Sheffield, MD  END OF SESSION:   PT End of Session - 05/09/22 1144     Visit Number 8    Number of Visits 17    Date for PT Re-Evaluation 06/06/22    Authorization Type [employer] employee    Authorization Time Period auth required after 25th visit    Authorization - Visit Number 8    Authorization - Number of Visits 25    PT Start Time 1146    PT Stop Time 1230    PT Time Calculation (min) 44 min    Activity Tolerance Patient tolerated treatment well;No increased pain    Behavior During Therapy WFL for tasks assessed/performed                   Past Medical History:  Diagnosis Date   Allergy    takes Claritin daily and Flonase daily   Arthritis    Asthma    uses Albuterol daily as needed;Singulair nightly;DUlera daily   Back pain    Constipation    Fatty liver    Food allergy    GERD (gastroesophageal reflux disease)    occasionally will take OTC meds but states she just watches what she eats   H/O hiatal hernia    History of bronchitis    last time 75yr ago   History of colon polyps    History of kidney stones    History of shingles    Hyperlipidemia    Hypertension    takes Hyzaar daily   Joint pain    Joint pain    Joint swelling    Obesity    Osteoarthritis    Pneumonia    hx of;last time at least 21yrago   PONV (postoperative nausea and vomiting)    Prediabetes    Shortness of breath    Sleep apnea    Swelling of lower extremity    Trouble in sleeping    Wheezing    Past Surgical History:  Procedure Laterality Date   BREAST EXCISIONAL BIOPSY Right    CARDIAC CATHETERIZATION     COLONOSCOPY     ESOPHAGOGASTRODUODENOSCOPY     KNEE SURGERY Right    arthroscopy   Right ganglion cyst     x 2   right lumpectomy  around [address] 11/15/2013   Procedure: Marcaine/STEROID INJECTION;  Surgeon: JoAlta CorningMD;  [facility];  Laterality: Left;   TOTAL KNEE ARTHROPLASTY Right 11/15/2013   Procedure: RIGHT TOTAL KNEE ARTHROPLASTY;  Surgeon: JoAlta CorningMD;  [facility];  Laterality: Right;   TOTAL KNEE ARTHROPLASTY Left 12/30/2016   Procedure: TOTAL KNEE ARTHROPLASTY;  Surgeon: GrDorna LeitzMD;  [place] [facility] [place];  Laterality: Left;   TUBAL LIGATION     Patient Active Problem List   Diagnosis Date Noted   Chronic pain syndrome 02/09/2022   Left ankle pain 01/12/2021   Overweight with body mass index (BMI) of 29 to 29.9 in adult 08/17/2020   At risk for diabetes mellitus 0101/74/9449 Nonalcoholic hepatosteatosis 0167/59/1638 At risk for heart disease 07/06/2020   Transaminitis 04/07/2020   Thrombocytopenia ([facility]01/10/2019   Insulin resistance 06/19/2019   Vitamin D deficiency 11/05/2018  Obstructive sleep apnea 10/10/2017   Primary osteoarthritis of left knee 12/30/2016   Class 1 obesity with serious comorbidity and body mass index (BMI) of 34.0 to 34.9 in adult 05/24/2016   Prediabetes 02/24/2014   Osteoarthritis of right knee 11/15/2013   Osteoarthritis of left knee 11/15/2013   Alkaline phosphatase elevation 01/02/2012   Hyperlipidemia 01/02/2012   Routine general medical examination at a health care facility 12/09/2011   Allergy to influenza vaccine 05/02/2011   POLYARTHRITIS 01/28/2010   Chronic pain of both knees 12/11/2009   ANGIOEDEMA 03/27/2007   Essential hypertension 03/19/2007   Perennial allergic rhinitis with seasonal variation 03/19/2007   Asthma, moderate persistent 03/19/2007   GERD 03/19/2007   HIATAL HERNIA 03/19/2007   GESTATIONAL DIABETES 03/19/2007    REFERRING DIAG:  "LBP, B knee pain, lumbar spondylosis, h/o B TKA"  THERAPY DIAG:  Other low back pain  Chronic pain of right  knee  Other abnormalities of gait and mobility  Muscle weakness (generalized)  Chronic pain of left knee  Rationale for Evaluation and Treatment Rehabilitation  PERTINENT HISTORY: Asthma, GERD, HTN, hx hiatal hernia  PRECAUTIONS: none  SUBJECTIVE:  Pt arrives with report of mild soreness after last session, lasts about a day. Pt states that she feels better today but hasn't worked. Pt states that since starting therapy the severity of her pain at its worst has improved.      PAIN:  Are you having pain: yes, 5-6/10 Location: back and B knees, right around sacrum How would you describe your pain? Achey, occasional sharp pains Best in past week: 4/10 Worst in past week: 10/10 (takes a couple hours to settle) Aggravating factors: prolonged walking/standing, difficulty bending/lifting, stair navigation Easing factors: medication, ice, movement/stretches, TENS   OBJECTIVE: (objective measures completed at initial evaluation unless otherwise dated)   DIAGNOSTIC FINDINGS:  Per 03/08/22 lumbar MRI:  IMPRESSION: Mild, facet predominant degenerative changes as detailed above are similar to the prior study. No significant stenosis.   Possible fibroid uterus. This could be further evaluated with ultrasound.   PATIENT SURVEYS:  FOTO 59% 05/04/2022: 52%   SCREENING FOR RED FLAGS: Red flag questioning unremarkable   COGNITION:           Overall cognitive status: Within functional limits for tasks assessed                          SENSATION: Light touch intact B LE, some increased sensitivity R medial knee pt states has been present since TKA     POSTURE: No Significant postural limitations   PALPATION: Tightness/TTP B quads, more notable medially TTP B QL and lumbar paraspinals, L more than R. TTP L glutes/piriformis. Concordant discomfort with grade 1-2 PA SI mobilizations, no change with repetition     LUMBAR ROM:    Active  A/PROM  eval  Flexion Mid shin, p!   Extension 75%, mild tenderness   Right lateral flexion    Left lateral flexion    Right rotation 75%  Left rotation 75% more pull   (Blank rows = not tested) Comment: able to achieve ~3 more inches of flexion after 5 reps repeated extension, about same amount of pain   LOWER EXTREMITY ROM:      Active  Right eval Left eval  Hip flexion      Hip extension      Hip internal rotation      Hip external rotation       (  Blank rows = not tested)   Comments:     LOWER EXTREMITY MMT:     MMT Right eval Left eval  Hip flexion 4 4  Hip abduction (modified sitting) 5 5  Hip internal rotation 4 4  Hip external rotation 4- 4-  Knee flexion 5 5  Knee extension 4+ 4+   (Blank rows = not tested)   Comments: mild pain in B knees with extension and hip ER     FUNCTIONAL TESTS:  5xSTS standard chair: 17sec no UE support, reduced velocity with repetition   GAIT: Distance walked: within clinic Assistive device utilized: None Level of assistance: Complete Independence Comments: reduced step length B, reduced hip ext, reduced truncal ROM and arm swing B, reduced gait speed and cadence       TODAY'S TREATMENT  [facility] Adult PT Treatment:                                                DATE: 05/09/22 Therapeutic Exercise: Blue theraband ER/IR w ball squeeze, 2x8 each Hooklying swiss ball iso x12, cues for bracing and breath control 2x12 bridges + ball squeeze, cues for sequencing and form Single leg golfers hinge unilat UE support, 2x8 each LE, cues for improved hinge and pelvic alignment DB DL 15# x8, cues for hinge and improved DB path Manual Therapy: Supine LAD B hips, grade 2-3, gentle oscillations Prone STM B QL/glute musculature; passive QL stretch B x5 each with ~30sec hold, gentle oscillations for muscle guarding; pin and stretch B piriformis/glutes    [facility] Adult PT Treatment:                                                DATE: 05/06/22 Therapeutic Exercise: Hooklying  isometric hip flex w/ swiss ball 2x8 each LE, performed unilaterally, cues for form and pacing LTR x12 B Seated IR/ER w/ ball and GTB, 2x12 each way KB DL 10# 2x10, cues for improved KB path and hinge Single leg golfers hinge, unilat UE support, x8 each LE, cues for form and control  Manual Therapy: LAD grade 2-3 B w gentle oscillations to reduce muscle guarding   [facility] Adult PT Treatment:                                                DATE: 05/04/2022 Therapeutic Exercise: Supine 90/90 mini bicycle kicks with handhold resistance through thighs 3x16 Hooklying LTR 2x10 Thomas stretch x5mn BIL Rt forward lunge rocking to table for SIJ directional preference 3x20 10# kettlebell deadlifts 3x8 Seated Lt piriformis stretch 2x173m Manual Therapy: Manual Lt hip lateral distraction [id number] mobilization belt Manual Lt hip long-axis distraction [id number]   PATIENT EDUCATION:  Education details: Rationale for interventions, HEP  Person educated: Patient Education method: Explanation, Demonstration, Tactile cues, Verbal cues Education comprehension: verbalized understanding, returned demonstration, verbal cues required, tactile cues required, and needs further education      HOME EXERCISE PROGRAM: Access Code: [email or web address]: [email or web address] Date: 04/27/2022 Prepared by: KrisStarr Lakeercises - Sit to Stand with Armchair  - 1 x daily -  7 x weekly - 3 sets - 10 reps - Seated Flexion Stretch  - 1 x daily - 7 x weekly - 3 sets - 10 reps - Supine Posterior Pelvic Tilt  - 1 x daily - 7 x weekly - 1 sets - 10 reps - 10 sec hold hold - Supine Bridge with Mini Swiss Ball Between Knees  - 1 x daily - 7 x weekly - 3 sets - 10 reps - Supine Lower Trunk Rotation  - 1 x daily - 7 x weekly - 1 sets - 5 reps - 20 sec  hold - Supine Piriformis Stretch with Foot on Ground  - 1 x daily - 7 x weekly - 1 sets - 3 reps - 30sec hold - Seated Hamstring Stretch  - 1 x daily - 7 x  weekly - 2 sets - 2 reps - 30 hold - Supine Hamstring Stretch with Strap  - 1 x daily - 7 x weekly - 2 sets - 2 reps - 30 hold - SLR  - 1 x daily - 7 x weekly - 3 sets - 10 reps - 1 hold   ASSESSMENT:   CLINICAL IMPRESSION: Pt arrives with reports of reduced pain but notes that she has not worked since Friday, reports soreness after last session that lasted about a day. Session initiated with manual therapy - noted reduction in L hip ER with pin and stretch compared to R, improves with repetition. Pt reports noted improvement in sitting tolerance and stiffness after manual although denies overt change in pain on NPS. Good tolerance for progression of resistance/volume throughout program without increase in symptoms. Pt departs with report of improved symptoms overall, no adverse events. Pt departs today's session in no acute distress, all voiced questions/concerns addressed appropriately from PT perspective.      OBJECTIVE IMPAIRMENTS Abnormal gait, decreased activity tolerance, decreased balance, decreased endurance, decreased mobility, difficulty walking, decreased ROM, decreased strength, hypomobility, impaired flexibility, and pain.    ACTIVITY LIMITATIONS carrying, lifting, bending, sitting, standing, squatting, stairs, transfers, and dressing   PARTICIPATION LIMITATIONS: meal prep, cleaning, laundry, driving, shopping, community activity, and occupation   PERSONAL FACTORS Time since onset of injury/illness/exacerbation and 1-2 comorbidities: HTN, asthma  are also affecting patient's functional outcome.      GOALS: Goals reviewed with patient? No due to time constraints   SHORT TERM GOALS: Target date: 05/09/2022   Pt will demonstrate appropriate understanding and performance of initially prescribed HEP in order to facilitate improved independence with management of symptoms.  Baseline: HEP provided on eval 04-21-22  HEP updated Goal status: ONGOING    2. Pt will score greater than or  equal to 64 on FOTO in order to demonstrate improved perception of function due to symptoms.            Baseline: 59  05/04/2022: 52            Goal status: ONGOING   LONG TERM GOALS: Target date: 06/06/2022   Pt will score 68 on FOTO in order to demonstrate improved perception of functional status due to symptoms.  Baseline: 59 05/04/2022: 52 Goal status: IN PROGRESS   2.  Pt will demonstrate 100% lumbar flexion AROM with less than 3/10 pain on NPS in order to demonstrate improved tolerance to typical ADLs such as lower body dressing and lifting.  Baseline: see ROM chart Goal status: INITIAL   3.  Pt will demonstrate hip IR/ER MMT of 4+ /5 in order to demonstrate improved  safety/stability with functional transfers. Baseline: see MMT chart Goal status: INITIAL   4. Pt will perform 5xSTS in <12 sec in order to demonstrate reduced fall risk and improved functional independence. (MCID of 2.3sec)            Baseline: 17sec            Goal status: INITIAL      PLAN: PT FREQUENCY: 1-2x/week   PT DURATION: 8 weeks   PLANNED INTERVENTIONS: Therapeutic exercises, Therapeutic activity, Neuromuscular re-education, Balance training, Gait training, Patient/Family education, Self Care, Joint mobilization, Stair training, Aquatic Therapy, Dry Needling, Electrical stimulation, Spinal mobilization, Cryotherapy, Moist heat, Manual therapy, and Re-evaluation.   PLAN FOR NEXT SESSION:  Continue to progress exercises emphasizing LE strength/balance, lumbopelvic stability. Manual as indicated for symptom modification and to maximize exercise tolerance.    Leeroy Cha PT, DPT 05/09/2022 12:35 PM

## 2022-05-10 ENCOUNTER — Other Ambulatory Visit (HOSPITAL_COMMUNITY): Payer: Self-pay

## 2022-05-10 DIAGNOSIS — M25569 Pain in unspecified knee: Secondary | ICD-10-CM | POA: Diagnosis not present

## 2022-05-10 DIAGNOSIS — M7552 Bursitis of left shoulder: Secondary | ICD-10-CM | POA: Diagnosis not present

## 2022-05-10 DIAGNOSIS — G4700 Insomnia, unspecified: Secondary | ICD-10-CM | POA: Diagnosis not present

## 2022-05-10 DIAGNOSIS — G894 Chronic pain syndrome: Secondary | ICD-10-CM | POA: Diagnosis not present

## 2022-05-10 MED ORDER — TRAMADOL HCL 50 MG PO TABS
50.0000 mg | ORAL_TABLET | Freq: Four times a day (QID) | ORAL | 1 refills | Status: DC | PRN
  Filled 2022-05-10: qty 240, 30d supply, fill #0

## 2022-05-10 MED ORDER — DULOXETINE HCL 60 MG PO CPEP
60.0000 mg | ORAL_CAPSULE | Freq: Every day | ORAL | 1 refills | Status: DC
  Filled 2022-05-10 – 2022-05-12 (×2): qty 30, 30d supply, fill #0
  Filled 2022-06-13: qty 30, 30d supply, fill #1

## 2022-05-11 ENCOUNTER — Ambulatory Visit: Payer: 59 | Admitting: Physical Therapy

## 2022-05-11 ENCOUNTER — Encounter: Payer: Self-pay | Admitting: Physical Therapy

## 2022-05-11 ENCOUNTER — Other Ambulatory Visit (HOSPITAL_COMMUNITY): Payer: Self-pay

## 2022-05-11 DIAGNOSIS — M25562 Pain in left knee: Secondary | ICD-10-CM | POA: Diagnosis not present

## 2022-05-11 DIAGNOSIS — M25561 Pain in right knee: Secondary | ICD-10-CM | POA: Diagnosis not present

## 2022-05-11 DIAGNOSIS — G8929 Other chronic pain: Secondary | ICD-10-CM

## 2022-05-11 DIAGNOSIS — M6281 Muscle weakness (generalized): Secondary | ICD-10-CM | POA: Diagnosis not present

## 2022-05-11 DIAGNOSIS — M5459 Other low back pain: Secondary | ICD-10-CM

## 2022-05-11 DIAGNOSIS — R2689 Other abnormalities of gait and mobility: Secondary | ICD-10-CM

## 2022-05-11 NOTE — Therapy (Signed)
OUTPATIENT PHYSICAL THERAPY TREATMENT NOTE   Patient Name: Katherine Tanner MRN: [id number] DOB:March 25, 1961, 61 y.o., female Today's Date: 05/11/2022  PCP: Abner Greenspan, MD   REFERRING PROVIDER: Margaretha Sheffield, MD  END OF SESSION:   PT End of Session - 05/11/22 1409     Visit Number 9    Number of Visits 17    Date for PT Re-Evaluation 06/06/22    Authorization Type [employer] employee    Authorization Time Period auth required after 25th visit    Authorization - Visit Number 9    Authorization - Number of Visits 25    PT Start Time 1409    PT Stop Time 1500    PT Time Calculation (min) 51 min    Activity Tolerance Patient tolerated treatment well;No increased pain    Behavior During Therapy WFL for tasks assessed/performed                    Past Medical History:  Diagnosis Date   Allergy    takes Claritin daily and Flonase daily   Arthritis    Asthma    uses Albuterol daily as needed;Singulair nightly;DUlera daily   Back pain    Constipation    Fatty liver    Food allergy    GERD (gastroesophageal reflux disease)    occasionally will take OTC meds but states she just watches what she eats   H/O hiatal hernia    History of bronchitis    last time 28yr ago   History of colon polyps    History of kidney stones    History of shingles    Hyperlipidemia    Hypertension    takes Hyzaar daily   Joint pain    Joint pain    Joint swelling    Obesity    Osteoarthritis    Pneumonia    hx of;last time at least 253yrago   PONV (postoperative nausea and vomiting)    Prediabetes    Shortness of breath    Sleep apnea    Swelling of lower extremity    Trouble in sleeping    Wheezing    Past Surgical History:  Procedure Laterality Date   BREAST EXCISIONAL BIOPSY Right    CARDIAC CATHETERIZATION     COLONOSCOPY     ESOPHAGOGASTRODUODENOSCOPY     KNEE SURGERY Right    arthroscopy   Right ganglion cyst     x 2   [place]   Procedure: Marcaine/STEROID INJECTION;  Surgeon: JoAlta CorningMD;  [facility];  Laterality: Left;   TOTAL KNEE ARTHROPLASTY Right 11/15/2013   Procedure: RIGHT TOTAL KNEE ARTHROPLASTY;  Surgeon: JoAlta CorningMD;  [facility];  Laterality: Right;   TOTAL KNEE ARTHROPLASTY Left 12/30/2016   Procedure: TOTAL KNEE ARTHROPLASTY;  Surgeon: GrDorna LeitzMD;  [facility];  Laterality: Left;   TUBAL LIGATION     Patient Active Problem List   Diagnosis Date Noted   Chronic pain syndrome 02/09/2022   Left ankle pain 01/12/2021   Overweight with body mass index (BMI) of 29 to 29.9 in adult 08/17/2020   At risk for diabetes mellitus 0138/18/2993 Nonalcoholic hepatosteatosis 0171/69/6789 At risk for heart disease 07/06/2020   Transaminitis 04/07/2020   Thrombocytopenia ([facility] [place]01/10/2019   Insulin resistance 06/19/2019   Vitamin D deficiency 11/05/2018  Obstructive sleep apnea 10/10/2017   Primary osteoarthritis of left knee 12/30/2016   Class 1 obesity with serious comorbidity and body mass index (BMI) of 34.0 to 34.9 in adult 05/24/2016   Prediabetes 02/24/2014   Osteoarthritis of right knee 11/15/2013   Osteoarthritis of left knee 11/15/2013   Alkaline phosphatase elevation 01/02/2012   Hyperlipidemia 01/02/2012   Routine general medical examination at a health care facility 12/09/2011   Allergy to influenza vaccine 05/02/2011   POLYARTHRITIS 01/28/2010   Chronic pain of both knees 12/11/2009   ANGIOEDEMA 03/27/2007   Essential hypertension 03/19/2007   Perennial allergic rhinitis with seasonal variation 03/19/2007   Asthma, moderate persistent 03/19/2007   GERD 03/19/2007   HIATAL HERNIA 03/19/2007   GESTATIONAL DIABETES 03/19/2007    REFERRING DIAG:  "LBP, B knee pain, lumbar spondylosis, h/o B TKA"  THERAPY DIAG:  Other low back pain  Chronic pain of  right knee  Other abnormalities of gait and mobility  Rationale for Evaluation and Treatment Rehabilitation  PERTINENT HISTORY: Asthma, GERD, HTN, hx hiatal hernia  PRECAUTIONS: none  SUBJECTIVE:  " I still have issues, I feel good while I exercise but it seems to come back. Today I am having a tough time my pain is a 10/10."   PAIN:  Are you having pain: yes, 10/10 (declined going to ED) Location: back and B knees, right around sacrum How would you describe your pain? Achey, occasional sharp pains Best in past week: 4/10 Worst in past week: 10/10 (takes a couple hours to settle) Aggravating factors: prolonged walking/standing, difficulty bending/lifting, stair navigation Easing factors: medication, ice, movement/stretches, TENS   OBJECTIVE: (objective measures completed at initial evaluation unless otherwise dated)   DIAGNOSTIC FINDINGS:  Per 03/08/22 lumbar MRI:  IMPRESSION: Mild, facet predominant degenerative changes as detailed above are similar to the prior study. No significant stenosis.   Possible fibroid uterus. This could be further evaluated with ultrasound.   PATIENT SURVEYS:  FOTO 59% 05/04/2022: 52%   SCREENING FOR RED FLAGS: Red flag questioning unremarkable   COGNITION:           Overall cognitive status: Within functional limits for tasks assessed                          SENSATION: Light touch intact B LE, some increased sensitivity R medial knee pt states has been present since TKA     POSTURE: No Significant postural limitations   PALPATION: Tightness/TTP B quads, more notable medially TTP B QL and lumbar paraspinals, L more than R. TTP L glutes/piriformis. Concordant discomfort with grade 1-2 PA SI mobilizations, no change with repetition     LUMBAR ROM:    Active  A/PROM  eval  Flexion Mid shin, p!  Extension 75%, mild tenderness   Right lateral flexion    Left lateral flexion    Right rotation 75%  Left rotation 75% more pull    (Blank rows = not tested) Comment: able to achieve ~3 more inches of flexion after 5 reps repeated extension, about same amount of pain   LOWER EXTREMITY ROM:      Active  Right eval Left eval  Hip flexion      Hip extension      Hip internal rotation      Hip external rotation       (Blank rows = not tested)   Comments:     LOWER EXTREMITY MMT:  MMT Right eval Left eval  Hip flexion 4 4  Hip abduction (modified sitting) 5 5  Hip internal rotation 4 4  Hip external rotation 4- 4-  Knee flexion 5 5  Knee extension 4+ 4+   (Blank rows = not tested)   Comments: mild pain in B knees with extension and hip ER     FUNCTIONAL TESTS:  5xSTS standard chair: 17sec no UE support, reduced velocity with repetition    05/11/2022   LLD assessment    True   R 90 cm   L 86.5 cm   Apparent   R 91.5   L 91.7     GAIT: Distance walked: within clinic Assistive device utilized: None Level of assistance: Complete Independence Comments: reduced step length B, reduced hip ext, reduced truncal ROM and arm swing B, reduced gait speed and cadence       TODAY'S TREATMENT   [facility] Adult PT Treatment:                                                DATE: 05/11/2022 Therapeutic Exercise: Supine thomas test stretch 3 x 30 sec on LLE Standing hip flexor stretch on the L combined with glue set 2 x 30 sec Hamstring curl 2 x 10  Bridge 2 x 15  Seated hamstring curl 3 x 15 with GTB , 1 set x 15 with BTB Manual Therapy: MTPR along L hip flexor /rectus femoris, and glute med  Neuromuscular re-ed: Gait Training  with heel lift in the L shoe 1 x 185 ft, verbal cues for heel strike/ toe off and reciprocal arm swing.   Self Care: Reviewed benefits of heel lift in the L shoe to promote pelvic equality based on measurements assessed with LLD of LLE   [facility] Adult PT Treatment:                                                DATE: 05/09/22 Therapeutic Exercise: Blue theraband ER/IR w ball  squeeze, 2x8 each Hooklying swiss ball iso x12, cues for bracing and breath control 2x12 bridges + ball squeeze, cues for sequencing and form Single leg golfers hinge unilat UE support, 2x8 each LE, cues for improved hinge and pelvic alignment DB DL 15# x8, cues for hinge and improved DB path Manual Therapy: Supine LAD B hips, grade 2-3, gentle oscillations Prone STM B QL/glute musculature; passive QL stretch B x5 each with ~30sec hold, gentle oscillations for muscle guarding; pin and stretch B piriformis/glutes    [facility] Adult PT Treatment:                                                DATE: 05/06/22 Therapeutic Exercise: Hooklying isometric hip flex w/ swiss ball 2x8 each LE, performed unilaterally, cues for form and pacing LTR x12 B Seated IR/ER w/ ball and GTB, 2x12 each way KB DL 10# 2x10, cues for improved KB path and hinge Single leg golfers hinge, unilat UE support, x8 each LE, cues for form and control  Manual Therapy: LAD grade 2-3 B w gentle  oscillations to reduce muscle guarding    PATIENT EDUCATION:  Education details: Rationale for interventions, HEP  Person educated: Patient Education method: Explanation, Demonstration, Tactile cues, Verbal cues Education comprehension: verbalized understanding, returned demonstration, verbal cues required, tactile cues required, and needs further education      HOME EXERCISE PROGRAM: Access Code: [email or web address] URL: [email or web address] Date: 05/11/2022 Prepared by: Starr Lake  Exercises - Sit to Stand with Armchair  - 1 x daily - 7 x weekly - 3 sets - 10 reps - Seated Flexion Stretch  - 1 x daily - 7 x weekly - 3 sets - 10 reps - Supine Posterior Pelvic Tilt  - 1 x daily - 7 x weekly - 1 sets - 10 reps - 10 sec hold hold - Supine Bridge with Mini Swiss Ball Between Knees  - 1 x daily - 7 x weekly - 3 sets - 10 reps - Supine Lower Trunk Rotation  - 1 x daily - 7 x weekly - 1 sets - 5 reps - 20 sec  hold - Supine  Piriformis Stretch with Foot on Ground  - 1 x daily - 7 x weekly - 1 sets - 3 reps - 30sec hold - Supine Quadriceps Stretch with Strap on Table  - 2 x daily - 7 x weekly - 2 sets - 2 reps - 30 hold - Standing Hip Flexor Stretch (Mirrored)  - 2 x daily - 7 x weekly - 2 sets - 2 reps - 30 hold - Seated Hamstring Curls with Resistance  - 1 x daily - 7 x weekly - 2 sets - 10 reps ASSESSMENT:   CLINICAL IMPRESSION: Pt arrives to PT today with report that she is having increased pain today and reports it is a 10/10, but declined going to the ED or urgent care. Further assessment revealed a true LLD with the LLE shorter by 3.5 cm compared bil, and an apparent LLD based on compensation of the LLE. Focused on addressing compensation with hip flexor stretching and hamstring and glute activation. Provided heel lift for her left shoe to promote equal pelvic height bil, and practiced gait which she noted improvement in soreness and felt like she was no longer dragging her RLE. End of session she noted feeling pain dropped to a 6-7/10 and felt looser.    OBJECTIVE IMPAIRMENTS Abnormal gait, decreased activity tolerance, decreased balance, decreased endurance, decreased mobility, difficulty walking, decreased ROM, decreased strength, hypomobility, impaired flexibility, and pain.    ACTIVITY LIMITATIONS carrying, lifting, bending, sitting, standing, squatting, stairs, transfers, and dressing   PARTICIPATION LIMITATIONS: meal prep, cleaning, laundry, driving, shopping, community activity, and occupation   PERSONAL FACTORS Time since onset of injury/illness/exacerbation and 1-2 comorbidities: HTN, asthma  are also affecting patient's functional outcome.      GOALS: Goals reviewed with patient? No due to time constraints   SHORT TERM GOALS: Target date: 05/09/2022   Pt will demonstrate appropriate understanding and performance of initially prescribed HEP in order to facilitate improved independence with  management of symptoms.  Baseline: HEP provided on eval 04-21-22  HEP updated Goal status: ONGOING    2. Pt will score greater than or equal to 64 on FOTO in order to demonstrate improved perception of function due to symptoms.            Baseline: 59  05/04/2022: 52            Goal status: ONGOING   LONG TERM GOALS: Target date: 06/06/2022   Pt  will score 68 on FOTO in order to demonstrate improved perception of functional status due to symptoms.  Baseline: 59 05/04/2022: 52 Goal status: IN PROGRESS   2.  Pt will demonstrate 100% lumbar flexion AROM with less than 3/10 pain on NPS in order to demonstrate improved tolerance to typical ADLs such as lower body dressing and lifting.  Baseline: see ROM chart Goal status: INITIAL   3.  Pt will demonstrate hip IR/ER MMT of 4+ /5 in order to demonstrate improved safety/stability with functional transfers. Baseline: see MMT chart Goal status: INITIAL   4. Pt will perform 5xSTS in <12 sec in order to demonstrate reduced fall risk and improved functional independence. (MCID of 2.3sec)            Baseline: 17sec            Goal status: INITIAL      PLAN: PT FREQUENCY: 1-2x/week   PT DURATION: 8 weeks   PLANNED INTERVENTIONS: Therapeutic exercises, Therapeutic activity, Neuromuscular re-education, Balance training, Gait training, Patient/Family education, Self Care, Joint mobilization, Stair training, Aquatic Therapy, Dry Needling, Electrical stimulation, Spinal mobilization, Cryotherapy, Moist heat, Manual therapy, and Re-evaluation.   PLAN FOR NEXT SESSION:  Continue to progress exercises emphasizing LE strength/balance, lumbopelvic stability. Manual as indicated for symptom modification and to maximize exercise tolerance.  Assess response to heel lift in the L shoe, trial SI belt.   Ashtian Villacis PT, DPT, LAT, [name]  05/11/22  3:08 PM

## 2022-05-12 ENCOUNTER — Other Ambulatory Visit (HOSPITAL_COMMUNITY): Payer: Self-pay

## 2022-05-16 ENCOUNTER — Ambulatory Visit: Payer: 59 | Admitting: Physical Therapy

## 2022-05-16 ENCOUNTER — Encounter: Payer: Self-pay | Admitting: Physical Therapy

## 2022-05-16 DIAGNOSIS — G8929 Other chronic pain: Secondary | ICD-10-CM

## 2022-05-16 DIAGNOSIS — M25562 Pain in left knee: Secondary | ICD-10-CM | POA: Diagnosis not present

## 2022-05-16 DIAGNOSIS — M5459 Other low back pain: Secondary | ICD-10-CM | POA: Diagnosis not present

## 2022-05-16 DIAGNOSIS — R2689 Other abnormalities of gait and mobility: Secondary | ICD-10-CM

## 2022-05-16 DIAGNOSIS — M6281 Muscle weakness (generalized): Secondary | ICD-10-CM | POA: Diagnosis not present

## 2022-05-16 DIAGNOSIS — M25561 Pain in right knee: Secondary | ICD-10-CM | POA: Diagnosis not present

## 2022-05-16 NOTE — Therapy (Unsigned)
OUTPATIENT PHYSICAL THERAPY TREATMENT NOTE   Patient Name: Katherine Tanner MRN: [id number] DOB:Nov 11, 1960, 61 y.o., female Today's Date: 05/17/2022  PCP: Abner Greenspan, MD   REFERRING PROVIDER: Margaretha Sheffield, MD  END OF SESSION:   PT End of Session - 05/16/22 1408     Visit Number 10    Number of Visits 17    Date for PT Re-Evaluation 06/06/22    Authorization Type [employer] employee    Authorization Time Period auth required after 25th visit    Authorization - Visit Number 10    PT Start Time 1410    PT Stop Time 1455    PT Time Calculation (min) 45 min    Activity Tolerance Patient tolerated treatment well;No increased pain    Behavior During Therapy WFL for tasks assessed/performed                     Past Medical History:  Diagnosis Date   Allergy    takes Claritin daily and Flonase daily   Arthritis    Asthma    uses Albuterol daily as needed;Singulair nightly;DUlera daily   Back pain    Constipation    Fatty liver    Food allergy    GERD (gastroesophageal reflux disease)    occasionally will take OTC meds but states she just watches what she eats   H/O hiatal hernia    History of bronchitis    last time 71yr ago   History of colon polyps    History of kidney stones    History of shingles    Hyperlipidemia    Hypertension    takes Hyzaar daily   Joint pain    Joint pain    Joint swelling    Obesity    Osteoarthritis    Pneumonia    hx of;last time at least 89yrago   PONV (postoperative nausea and vomiting)    Prediabetes    Shortness of breath    Sleep apnea    Swelling of lower extremity    Trouble in sleeping    Wheezing    Past Surgical History:  Procedure Laterality Date   BREAST EXCISIONAL BIOPSY Right    CARDIAC CATHETERIZATION     COLONOSCOPY     ESOPHAGOGASTRODUODENOSCOPY     KNEE SURGERY Right    arthroscopy   Right ganglion cyst     x 2   right lumpectomy  around [address]  11/15/2013   Procedure: Marcaine/STEROID INJECTION;  Surgeon: JoAlta CorningMD;  [facility];  Laterality: Left;   TOTAL KNEE ARTHROPLASTY Right 11/15/2013   Procedure: RIGHT TOTAL KNEE ARTHROPLASTY;  Surgeon: JoAlta CorningMD;  [place] [facility];  Laterality: Right;   TOTAL KNEE ARTHROPLASTY Left 12/30/2016   Procedure: TOTAL KNEE ARTHROPLASTY;  Surgeon: GrDorna LeitzMD;  [facility];  Laterality: Left;   TUBAL LIGATION     Patient Active Problem List   Diagnosis Date Noted   Chronic pain syndrome 02/09/2022   Left ankle pain 01/12/2021   Overweight with body mass index (BMI) of 29 to 29.9 in adult 08/17/2020   At risk for diabetes mellitus 0103/70/4888 Nonalcoholic hepatosteatosis 0191/69/4503 At risk for heart disease 07/06/2020   Transaminitis 04/07/2020   Thrombocytopenia ([facility]inar01/10/2019   Insulin resistance 06/19/2019   Vitamin D deficiency 11/05/2018   Obstructive sleep apnea 10/10/2017  Primary osteoarthritis of left knee 12/30/2016   Class 1 obesity with serious comorbidity and body mass index (BMI) of 34.0 to 34.9 in adult 05/24/2016   Prediabetes 02/24/2014   Osteoarthritis of right knee 11/15/2013   Osteoarthritis of left knee 11/15/2013   Alkaline phosphatase elevation 01/02/2012   Hyperlipidemia 01/02/2012   Routine general medical examination at a health care facility 12/09/2011   Allergy to influenza vaccine 05/02/2011   POLYARTHRITIS 01/28/2010   Chronic pain of both knees 12/11/2009   ANGIOEDEMA 03/27/2007   Essential hypertension 03/19/2007   Perennial allergic rhinitis with seasonal variation 03/19/2007   Asthma, moderate persistent 03/19/2007   GERD 03/19/2007   HIATAL HERNIA 03/19/2007   GESTATIONAL DIABETES 03/19/2007    REFERRING DIAG:  "LBP, B knee pain, lumbar spondylosis, h/o B TKA"  THERAPY DIAG:  Other low back pain  Chronic pain of right knee  Other abnormalities  of gait and mobility  Rationale for Evaluation and Treatment Rehabilitation  PERTINENT HISTORY: Asthma, GERD, HTN, hx hiatal hernia  PRECAUTIONS: none  SUBJECTIVE:  "I feel like I am walking straighter, and I don't feel like I am dragging my other leg now."   PAIN:  Are you having pain: yes, 5/10 Location: back and B knees, right around sacrum How would you describe your pain? Achey, Best in past week: 4/10 Worst in past week: 10/10 (takes a couple hours to settle) Aggravating factors: prolonged walking/standing, difficulty bending/lifting, stair navigation Easing factors: medication, ice, movement/stretches, TENS   OBJECTIVE: (objective measures completed at initial evaluation unless otherwise dated)   DIAGNOSTIC FINDINGS:  Per 03/08/22 lumbar MRI:  IMPRESSION: Mild, facet predominant degenerative changes as detailed above are similar to the prior study. No significant stenosis.   Possible fibroid uterus. This could be further evaluated with ultrasound.   PATIENT SURVEYS:  FOTO 59% 05/04/2022: 52% 05/16/2022: 52%   SCREENING FOR RED FLAGS: Red flag questioning unremarkable   COGNITION:           Overall cognitive status: Within functional limits for tasks assessed                          SENSATION: Light touch intact B LE, some increased sensitivity R medial knee pt states has been present since TKA     POSTURE: No Significant postural limitations   PALPATION: Tightness/TTP B quads, more notable medially TTP B QL and lumbar paraspinals, L more than R. TTP L glutes/piriformis. Concordant discomfort with grade 1-2 PA SI mobilizations, no change with repetition     LUMBAR ROM:    Active  A/PROM  eval  Flexion Mid shin, p!  Extension 75%, mild tenderness   Right lateral flexion    Left lateral flexion    Right rotation 75%  Left rotation 75% more pull   (Blank rows = not tested) Comment: able to achieve ~3 more inches of flexion after 5 reps repeated  extension, about same amount of pain   LOWER EXTREMITY ROM:      Active  Right eval Left eval  Hip flexion      Hip extension      Hip internal rotation      Hip external rotation       (Blank rows = not tested)   Comments:     LOWER EXTREMITY MMT:     MMT Right eval Left eval  Hip flexion 4 4  Hip abduction (modified sitting) 5 5  Hip internal rotation  4 4  Hip external rotation 4- 4-  Knee flexion 5 5  Knee extension 4+ 4+   (Blank rows = not tested)   Comments: mild pain in B knees with extension and hip ER     FUNCTIONAL TESTS:  5xSTS standard chair: 17sec no UE support, reduced velocity with repetition    05/11/2022   LLD assessment    True   R 90 cm   L 86.5 cm   Apparent   R 91.5   L 91.7     GAIT: Distance walked: within clinic Assistive device utilized: None Level of assistance: Complete Independence Comments: reduced step length B, reduced hip ext, reduced truncal ROM and arm swing B, reduced gait speed and cadence       TODAY'S TREATMENT   [facility] Adult PT Treatment:                                                DATE: 05/16/2022 Therapeutic Exercise: Prone hip flexor stretch PNF contract with 10 sec contraction. Prone hamstring curl with hip extension 2 x 10, with 3# LLE Supine clamshell 2 x 15 with GTB Sit to stand 2 x 10 with controlled eccentric loading x 5 seconds LTR 1 x 10 holding postion x 10 secs with LLE rotated to the L Seated on dynadisc anterior pelvic tilt with abdominal draw in maneuver 1 x 10 holding 10 seconds Palloff press with GTB while seated on dynadisc 1 x 12 bil while maintaining anterior pelvic tilt Nu-step L6 x 5 min UE/LE only  Manual Therapy: MTPR along the L lumbar paraspinals  and L QL.    [facility] Adult PT Treatment:                                                DATE: 05/11/2022 Therapeutic Exercise: Supine thomas test stretch 3 x 30 sec on LLE Standing hip flexor stretch on the L combined with glue set 2 x 30  sec Hamstring curl 2 x 10  Bridge 2 x 15  Seated hamstring curl 3 x 15 with GTB , 1 set x 15 with BTB Manual Therapy: MTPR along L hip flexor /rectus femoris, and glute med  Neuromuscular re-ed: Gait Training  with heel lift in the L shoe 1 x 185 ft, verbal cues for heel strike/ toe off and reciprocal arm swing.   Self Care: Reviewed benefits of heel lift in the L shoe to promote pelvic equality based on measurements assessed with LLD of LLE   [facility] Adult PT Treatment:                                                DATE: 05/09/22 Therapeutic Exercise: Blue theraband ER/IR w ball squeeze, 2x8 each Hooklying swiss ball iso x12, cues for bracing and breath control 2x12 bridges + ball squeeze, cues for sequencing and form Single leg golfers hinge unilat UE support, 2x8 each LE, cues for improved hinge and pelvic alignment DB DL 15# x8, cues for hinge and improved DB path Manual Therapy: Supine LAD B hips, grade 2-3,  gentle oscillations Prone STM B QL/glute musculature; passive QL stretch B x5 each with ~30sec hold, gentle oscillations for muscle guarding; pin and stretch B piriformis/glutes    PATIENT EDUCATION:  Education details: Rationale for interventions, HEP  Person educated: Patient Education method: Explanation, Demonstration, Tactile cues, Verbal cues Education comprehension: verbalized understanding, returned demonstration, verbal cues required, tactile cues required, and needs further education      HOME EXERCISE PROGRAM: Access Code: [email or web address] URL: [email or web address] Date: 05/11/2022 Prepared by: Starr Lake  Exercises - Sit to Stand with Armchair  - 1 x daily - 7 x weekly - 3 sets - 10 reps - Seated Flexion Stretch  - 1 x daily - 7 x weekly - 3 sets - 10 reps - Supine Posterior Pelvic Tilt  - 1 x daily - 7 x weekly - 1 sets - 10 reps - 10 sec hold hold - Supine Bridge with Mini Swiss Ball Between Knees  - 1 x daily - 7 x weekly - 3 sets - 10  reps - Supine Lower Trunk Rotation  - 1 x daily - 7 x weekly - 1 sets - 5 reps - 20 sec  hold - Supine Piriformis Stretch with Foot on Ground  - 1 x daily - 7 x weekly - 1 sets - 3 reps - 30sec hold - Supine Quadriceps Stretch with Strap on Table  - 2 x daily - 7 x weekly - 2 sets - 2 reps - 30 hold - Standing Hip Flexor Stretch (Mirrored)  - 2 x daily - 7 x weekly - 2 sets - 2 reps - 30 hold - Seated Hamstring Curls with Resistance  - 1 x daily - 7 x weekly - 2 sets - 10 reps ASSESSMENT:   CLINICAL IMPRESSION: Mrs Mindi reports improvement with the heel lift in her L shoe noting improvement in standing posture, and hasn't been dragging her RLE. She does continue to report pain but rates it at 5/10 today. Continued working on L SIJ involvement with hip flexor stretching and posterior chain activation. She did very well with all exercises and reported pain dropped to a 3/10 end of session.    OBJECTIVE IMPAIRMENTS Abnormal gait, decreased activity tolerance, decreased balance, decreased endurance, decreased mobility, difficulty walking, decreased ROM, decreased strength, hypomobility, impaired flexibility, and pain.    ACTIVITY LIMITATIONS carrying, lifting, bending, sitting, standing, squatting, stairs, transfers, and dressing   PARTICIPATION LIMITATIONS: meal prep, cleaning, laundry, driving, shopping, community activity, and occupation   PERSONAL FACTORS Time since onset of injury/illness/exacerbation and 1-2 comorbidities: HTN, asthma  are also affecting patient's functional outcome.      GOALS: Goals reviewed with patient? No due to time constraints   SHORT TERM GOALS: Target date: 05/09/2022   Pt will demonstrate appropriate understanding and performance of initially prescribed HEP in order to facilitate improved independence with management of symptoms.  Baseline: HEP provided on eval 04-21-22  HEP updated Goal status: MET 05/16/2022   2. Pt will score greater than or equal to 64  on FOTO in order to demonstrate improved perception of function due to symptoms.            Baseline: 59  05/04/2022: 52            Goal status: ONGOING   LONG TERM GOALS: Target date: 06/06/2022   Pt will score 68 on FOTO in order to demonstrate improved perception of functional status due to symptoms.  Baseline: 59 05/04/2022: 52 Goal status: IN PROGRESS  2.  Pt will demonstrate 100% lumbar flexion AROM with less than 3/10 pain on NPS in order to demonstrate improved tolerance to typical ADLs such as lower body dressing and lifting.  Baseline: see ROM chart Goal status: INITIAL   3.  Pt will demonstrate hip IR/ER MMT of 4+ /5 in order to demonstrate improved safety/stability with functional transfers. Baseline: see MMT chart Goal status: INITIAL   4. Pt will perform 5xSTS in <12 sec in order to demonstrate reduced fall risk and improved functional independence. (MCID of 2.3sec)            Baseline: 17sec            Goal status: INITIAL      PLAN: PT FREQUENCY: 1-2x/week   PT DURATION: 8 weeks   PLANNED INTERVENTIONS: Therapeutic exercises, Therapeutic activity, Neuromuscular re-education, Balance training, Gait training, Patient/Family education, Self Care, Joint mobilization, Stair training, Aquatic Therapy, Dry Needling, Electrical stimulation, Spinal mobilization, Cryotherapy, Moist heat, Manual therapy, and Re-evaluation.   PLAN FOR NEXT SESSION:  Continue to progress exercises emphasizing LE strength/balance, lumbopelvic stability. Manual as indicated for symptom modification and to maximize exercise tolerance.  Assess response to heel lift in the L shoe, trial SI belt.   Kristoffer Leamon PT, DPT, LAT, [name]  05/17/22  7:42 AM

## 2022-05-17 NOTE — Progress Notes (Signed)
Chief Complaint:   OBESITY Katherine Tanner is here to discuss her progress with her obesity treatment plan along with follow-up of her obesity related diagnoses. Katherine Tanner is on the Category 2 Plan and states she is following her eating plan approximately 98% of the time. Katherine Tanner states she is doing physical therapy.    Today's visit was #: 40 Starting weight: 198 lbs Starting date: 08/30/2018 Today's weight: 152 lbs Today's date: 05/09/2022 Total lbs lost to date: 46 Total lbs lost since last in-office visit: 0  Interim History: Katherine Tanner has done well with maintaining her weight loss.  She is doing well with meal planning and trying to meet her vegetable and protein goals.  She is now doing physical therapy regularly and she is working on Katherine Tanner, adult education.  Subjective:   1. Prediabetes Katherine Tanner is taking metformin, and she is working on her diet and weight loss.  Her last A1c and insulin were controlled.  I discussed labs with the patient today.  2. Essential hypertension Katherine Tanner is on losartan.  Her blood pressure is elevated today, but she just took her pills this morning.  I discussed labs with the patient today.  3. Vitamin D deficiency Katherine Tanner's vitamin D level dropped slightly below goal.  She is on vitamin D OTC 1000 units daily.  I discussed labs with the patient today.  Assessment/Plan:   1. Prediabetes Katherine Tanner will continue with her diet and exercise, and we will continue to manage her metformin prescription.  2. Essential hypertension Katherine Tanner will continue her medications as is, and we will continue to monitor.  3. Vitamin D deficiency Katherine Tanner agreed to increase OTC Vitamin D to 2,000 IU daily.   4. Obesity, Current BMI 28.9 Katherine Tanner is currently in the action stage of change. As such, her goal is to continue with weight loss efforts. She has agreed to the Category 2 Plan.   Bioimpedance scale stroke was discussed in depth.  Patient's visceral fat at goal, fat percentage  close to goal, and muscle mass close to goal.  Exercise goals: As is, and add strengthening.   Behavioral modification strategies: increasing lean protein intake and meal planning and cooking strategies.  Katherine Tanner has agreed to follow-up with our clinic in 4 weeks. She was informed of the importance of frequent follow-up visits to maximize her success with intensive lifestyle modifications for her multiple health conditions.   Objective:   Blood pressure (!) 148/61, pulse 82, temperature 98 F (36.7 C), height '5\' 1"'$  (1.549 m), weight 152 lb (68.9 kg), last menstrual period 02/20/2014, SpO2 100 %. Body mass index is 28.72 kg/m.  General: Cooperative, alert, well developed, in no acute distress. HEENT: Conjunctivae and lids unremarkable. Cardiovascular: Regular rhythm.  Lungs: Normal work of breathing. Neurologic: No focal deficits.   Lab Results  Component Value Date   CREATININE 0.51 (L) 03/28/2022   BUN 13 03/28/2022   NA 145 (H) 03/28/2022   K 4.1 03/28/2022   CL 101 03/28/2022   CO2 26 03/28/2022   Lab Results  Component Value Date   ALT 38 (H) 03/28/2022   AST 26 03/28/2022   ALKPHOS 131 (H) 03/28/2022   BILITOT 0.3 03/28/2022   Lab Results  Component Value Date   HGBA1C 5.6 03/28/2022   HGBA1C 5.6 12/20/2021   HGBA1C 5.6 09/28/2021   HGBA1C 5.8 07/09/2021   HGBA1C 5.6 06/24/2021   Lab Results  Component Value Date   INSULIN 6.7 03/28/2022   INSULIN 10.0 12/20/2021   INSULIN  3.7 09/28/2021   INSULIN 7.0 06/24/2021   INSULIN 6.9 03/22/2021   Lab Results  Component Value Date   TSH 0.91 07/09/2021   Lab Results  Component Value Date   CHOL 146 07/09/2021   HDL 61.70 07/09/2021   LDLCALC 77 07/09/2021   LDLDIRECT 147.9 12/17/2012   TRIG 35.0 07/09/2021   CHOLHDL 2 07/09/2021   Lab Results  Component Value Date   VD25OH 45.1 03/28/2022   VD25OH 67.1 12/20/2021   VD25OH 79.5 09/28/2021   Lab Results  Component Value Date   WBC 6.3 03/21/2022    HGB 12.9 03/21/2022   HCT 39.4 03/21/2022   MCV 90.0 03/21/2022   PLT 85 (L) 03/21/2022   Lab Results  Component Value Date   IRON 63 07/07/2020   Attestation Statements:   Reviewed by clinician on day of visit: allergies, medications, problem list, medical history, surgical history, family history, social history, and previous encounter notes.  Time spent on visit including pre-visit chart review and post-visit care and charting was 32 minutes.   I, Trixie Dredge, am acting as transcriptionist for Dennard Nip, MD.  I have reviewed the above documentation for accuracy and completeness, and I agree with the above. -  Dennard Nip, MD

## 2022-05-17 NOTE — Therapy (Signed)
OUTPATIENT PHYSICAL THERAPY TREATMENT NOTE   Patient Name: Katherine Tanner MRN: [id number] DOB:May 21, 1961, 61 y.o., female Today's Date: 05/18/2022  PCP: Abner Greenspan, MD   REFERRING PROVIDER: Margaretha Sheffield, MD  END OF SESSION:   PT End of Session - 05/18/22 1409     Visit Number 11    Number of Visits 17    Date for PT Re-Evaluation 06/06/22    Authorization Type [employer] employee    Authorization Time Period auth required after 25th visit    Authorization - Visit Number 11    Authorization - Number of Visits 25    PT Start Time 1410    PT Stop Time 1454    PT Time Calculation (min) 44 min    Activity Tolerance Patient tolerated treatment well;No increased pain    Behavior During Therapy WFL for tasks assessed/performed                      Past Medical History:  Diagnosis Date   Allergy    takes Claritin daily and Flonase daily   Arthritis    Asthma    uses Albuterol daily as needed;Singulair nightly;DUlera daily   Back pain    Constipation    Fatty liver    Food allergy    GERD (gastroesophageal reflux disease)    occasionally will take OTC meds but states she just watches what she eats   H/O hiatal hernia    History of bronchitis    last time 60yr ago   History of colon polyps    History of kidney stones    History of shingles    Hyperlipidemia    Hypertension    takes Hyzaar daily   Joint pain    Joint pain    Joint swelling    Obesity    Osteoarthritis    Pneumonia    hx of;last time at least 89yrago   PONV (postoperative nausea and vomiting)    Prediabetes    Shortness of breath    Sleep apnea    Swelling of lower extremity    Trouble in sleeping    Wheezing    Past Surgical History:  Procedure Laterality Date   BREAST EXCISIONAL BIOPSY Right    CARDIAC CATHETERIZATION     COLONOSCOPY     ESOPHAGOGASTRODUODENOSCOPY     KNEE SURGERY Right    arthroscopy   Right ganglion cyst     x 2   right lumpectomy   around 21Tequestaeft 11/15/2013   Procedure: Marcaine/STEROID INJECTION;  Surgeon: JoAlta CorningMD;  [facility];  Laterality: Left;   TOTAL KNEE ARTHROPLASTY Right 11/15/2013   Procedure: RIGHT TOTAL KNEE ARTHROPLASTY;  Surgeon: JoAlta CorningMD;  [facility];  Laterality: Right;   TOTAL KNEE ARTHROPLASTY Left 12/30/2016   Procedure: TOTAL KNEE ARTHROPLASTY;  Surgeon: GrDorna LeitzMD;  [facility];  Laterality: Left;   TUBAL LIGATION     Patient Active Problem List   Diagnosis Date Noted   Chronic pain syndrome 02/09/2022   Left ankle pain 01/12/2021   Overweight with body mass index (BMI) of 29 to 29.9 in adult 08/17/2020   At risk for diabetes mellitus 0114/97/0263 Nonalcoholic hepatosteatosis 0178/58/8502 At risk for heart disease 07/06/2020   Transaminitis 04/07/2020   Thrombocytopenia ([place]10[place]   Insulin resistance 06/19/2019   Vitamin D  deficiency 11/05/2018   Obstructive sleep apnea 10/10/2017   Primary osteoarthritis of left knee 12/30/2016   Class 1 obesity with serious comorbidity and body mass index (BMI) of 34.0 to 34.9 in adult 05/24/2016   Prediabetes 02/24/2014   Osteoarthritis of right knee 11/15/2013   Osteoarthritis of left knee 11/15/2013   Alkaline phosphatase elevation 01/02/2012   Hyperlipidemia 01/02/2012   Routine general medical examination at a health care facility 12/09/2011   Allergy to influenza vaccine 05/02/2011   POLYARTHRITIS 01/28/2010   Chronic pain of both knees 12/11/2009   ANGIOEDEMA 03/27/2007   Essential hypertension 03/19/2007   Perennial allergic rhinitis with seasonal variation 03/19/2007   Asthma, moderate persistent 03/19/2007   GERD 03/19/2007   HIATAL HERNIA 03/19/2007   GESTATIONAL DIABETES 03/19/2007    REFERRING DIAG:  "LBP, B knee pain, lumbar spondylosis, h/o B TKA"  THERAPY DIAG:  Other low back pain  Chronic pain  of right knee  Other abnormalities of gait and mobility  Muscle weakness (generalized)  Chronic pain of left knee  Rationale for Evaluation and Treatment Rehabilitation  PERTINENT HISTORY: Asthma, GERD, HTN, hx hiatal hernia  PRECAUTIONS: none  SUBJECTIVE:  Pt notes that heel lifts seem to have helped her gait and balance but still feels significant pain at work. Mild soreness after last visit     PAIN:  Are you having pain: yes, 8/10 Location: back and B knees, right around sacrum How would you describe your pain? [place], Best in past week: 4/10 Worst in past week: 10/10 (takes a couple hours to settle) Aggravating factors: prolonged walking/standing, difficulty bending/lifting, stair navigation Easing factors: medication, ice, movement/stretches, TENS   OBJECTIVE: (objective measures completed at initial evaluation unless otherwise dated)   DIAGNOSTIC FINDINGS:  Per 03/08/22 lumbar MRI:  IMPRESSION: Mild, facet predominant degenerative changes as detailed above are similar to the prior study. No significant stenosis.   Possible fibroid uterus. This could be further evaluated with ultrasound.   PATIENT SURVEYS:  FOTO 59% 05/04/2022: 52% 05/16/2022: 52%   SCREENING FOR RED FLAGS: Red flag questioning unremarkable   COGNITION:           Overall cognitive status: Within functional limits for tasks assessed                          SENSATION: Light touch intact B LE, some increased sensitivity R medial knee pt states has been present since TKA     POSTURE: No Significant postural limitations   PALPATION: Tightness/TTP B quads, more notable medially TTP B QL and lumbar paraspinals, L more than R. TTP L glutes/piriformis. Concordant discomfort with grade 1-2 PA SI mobilizations, no change with repetition     LUMBAR ROM:    Active  A/PROM  eval  Flexion Mid shin, p!  Extension 75%, mild tenderness   Right lateral flexion    Left lateral flexion    Right  rotation 75%  Left rotation 75% more pull   (Blank rows = not tested) Comment: able to achieve ~3 more inches of flexion after 5 reps repeated extension, about same amount of pain   LOWER EXTREMITY ROM:      Active  Right eval Left eval  Hip flexion      Hip extension      Hip internal rotation      Hip external rotation       (Blank rows = not tested)   Comments:  LOWER EXTREMITY MMT:     MMT Right eval Left eval  Hip flexion 4 4  Hip abduction (modified sitting) 5 5  Hip internal rotation 4 4  Hip external rotation 4- 4-  Knee flexion 5 5  Knee extension 4+ 4+   (Blank rows = not tested)   Comments: mild pain in B knees with extension and hip ER     FUNCTIONAL TESTS:  5xSTS standard chair: 17sec no UE support, reduced velocity with repetition 5xSTS 05/18/22 14sec minimal UE support on knees, consistent velocity     05/11/2022   LLD assessment    True   R 90 cm   L 86.5 cm   Apparent   R 91.5   L 91.7     GAIT: Distance walked: within clinic Assistive device utilized: None Level of assistance: Complete Independence Comments: reduced step length B, reduced hip ext, reduced truncal ROM and arm swing B, reduced gait speed and cadence       TODAY'S TREATMENT  [facility] Adult PT Treatment:                                                DATE: 05/18/22 Therapeutic Exercise: Marcello Moores stretch 2x30sec B LE 5xSTS BW, 2x8 w 10# KB Supine hip flex + contralateral ext manual resistance for lumbopelvic endurance, 2x5 GTB paloff press seated on dynadisc 2x10 Dynadisc seated march holding 10# KB 2x8 each LE cues for posture and velocity  Manual Therapy: Prone STM B QL, lumbar and thoracic paraspinals, proximal glutes Pin and stretch hip rotators, prone, IR/ER passively    [facility] Adult PT Treatment:                                                DATE: 05/16/2022 Therapeutic Exercise: Prone hip flexor stretch PNF contract with 10 sec contraction. Prone hamstring curl  with hip extension 2 x 10, with 3# LLE Supine clamshell 2 x 15 with GTB Sit to stand 2 x 10 with controlled eccentric loading x 5 seconds LTR 1 x 10 holding postion x 10 secs with LLE rotated to the L Seated on dynadisc anterior pelvic tilt with abdominal draw in maneuver 1 x 10 holding 10 seconds Palloff press with GTB while seated on dynadisc 1 x 12 bil while maintaining anterior pelvic tilt Nu-step L6 x 5 min UE/LE only  Manual Therapy: MTPR along the L lumbar paraspinals  and L QL.    [facility] Adult PT Treatment:                                                DATE: 05/11/2022 Therapeutic Exercise: Supine thomas test stretch 3 x 30 sec on LLE Standing hip flexor stretch on the L combined with glue set 2 x 30 sec Hamstring curl 2 x 10  Bridge 2 x 15  Seated hamstring curl 3 x 15 with GTB , 1 set x 15 with BTB Manual Therapy: MTPR along L hip flexor /rectus femoris, and glute med  Neuromuscular re-ed: Gait Training  with heel lift in the L shoe 1  x 185 ft, verbal cues for heel strike/ toe off and reciprocal arm swing.   Self Care: Reviewed benefits of heel lift in the L shoe to promote pelvic equality based on measurements assessed with LLD of LLE    PATIENT EDUCATION:  Education details: Rationale for interventions, HEP  Person educated: Patient Education method: Explanation, Demonstration, Tactile cues, Verbal cues Education comprehension: verbalized understanding, returned demonstration, verbal cues required, tactile cues required, and needs further education      HOME EXERCISE PROGRAM: Access Code: [email or web address] URL: [email or web address] Date: 05/11/2022 Prepared by: Starr Lake  Exercises - Sit to Stand with Armchair  - 1 x daily - 7 x weekly - 3 sets - 10 reps - Seated Flexion Stretch  - 1 x daily - 7 x weekly - 3 sets - 10 reps - Supine Posterior Pelvic Tilt  - 1 x daily - 7 x weekly - 1 sets - 10 reps - 10 sec hold hold - Supine Bridge with Mini Swiss  Ball Between Knees  - 1 x daily - 7 x weekly - 3 sets - 10 reps - Supine Lower Trunk Rotation  - 1 x daily - 7 x weekly - 1 sets - 5 reps - 20 sec  hold - Supine Piriformis Stretch with Foot on Ground  - 1 x daily - 7 x weekly - 1 sets - 3 reps - 30sec hold - Supine Quadriceps Stretch with Strap on Table  - 2 x daily - 7 x weekly - 2 sets - 2 reps - 30 hold - Standing Hip Flexor Stretch (Mirrored)  - 2 x daily - 7 x weekly - 2 sets - 2 reps - 30 hold - Seated Hamstring Curls with Resistance  - 1 x daily - 7 x weekly - 2 sets - 10 reps ASSESSMENT:   CLINICAL IMPRESSION: Pt arrives with increased pain after work activities - she states that heel lifts seem to have improved mechanics but still having about the same amount of pain at work. Today's session continues to address LE mobility and lumbopelvic activation/stability, glute activation. Session initiated w/ manual for symptom modification - denies overt change in pain but reports significant improvement in stiffness. Pt tolerates session well with report of muscular fatigue, reports 6-7/10 pain on departure. No adverse events.    OBJECTIVE IMPAIRMENTS Abnormal gait, decreased activity tolerance, decreased balance, decreased endurance, decreased mobility, difficulty walking, decreased ROM, decreased strength, hypomobility, impaired flexibility, and pain.    ACTIVITY LIMITATIONS carrying, lifting, bending, sitting, standing, squatting, stairs, transfers, and dressing   PARTICIPATION LIMITATIONS: meal prep, cleaning, laundry, driving, shopping, community activity, and occupation   PERSONAL FACTORS Time since onset of injury/illness/exacerbation and 1-2 comorbidities: HTN, asthma  are also affecting patient's functional outcome.      GOALS: Goals reviewed with patient? No due to time constraints   SHORT TERM GOALS: Target date: 05/09/2022   Pt will demonstrate appropriate understanding and performance of initially prescribed HEP in order to  facilitate improved independence with management of symptoms.  Baseline: HEP provided on eval 04-21-22  HEP updated Goal status: MET 05/16/2022   2. Pt will score greater than or equal to 64 on FOTO in order to demonstrate improved perception of function due to symptoms.            Baseline: 59  05/04/2022: 52            Goal status: ONGOING   LONG TERM GOALS: Target date: 06/06/2022   Pt will  score 68 on FOTO in order to demonstrate improved perception of functional status due to symptoms.  Baseline: 59 05/04/2022: 52 Goal status: IN PROGRESS   2.  Pt will demonstrate 100% lumbar flexion AROM with less than 3/10 pain on NPS in order to demonstrate improved tolerance to typical ADLs such as lower body dressing and lifting.  Baseline: see ROM chart Goal status: INITIAL   3.  Pt will demonstrate hip IR/ER MMT of 4+ /5 in order to demonstrate improved safety/stability with functional transfers. Baseline: see MMT chart Goal status: INITIAL   4. Pt will perform 5xSTS in <12 sec in order to demonstrate reduced fall risk and improved functional independence. (MCID of 2.3sec)            Baseline: 17sec  05/18/22: 14 sec            Goal status: ONGOING     PLAN: PT FREQUENCY: 1-2x/week   PT DURATION: 8 weeks   PLANNED INTERVENTIONS: Therapeutic exercises, Therapeutic activity, Neuromuscular re-education, Balance training, Gait training, Patient/Family education, Self Care, Joint mobilization, Stair training, Aquatic Therapy, Dry Needling, Electrical stimulation, Spinal mobilization, Cryotherapy, Moist heat, Manual therapy, and Re-evaluation.   PLAN FOR NEXT SESSION:  trial SI belt, continue lumbopelvic stability exercises and functional strengthening   Leeroy Cha PT, DPT 05/18/2022 2:57 PM

## 2022-05-18 ENCOUNTER — Encounter: Payer: Self-pay | Admitting: Physical Therapy

## 2022-05-18 ENCOUNTER — Ambulatory Visit: Payer: 59 | Admitting: Physical Therapy

## 2022-05-18 DIAGNOSIS — M5459 Other low back pain: Secondary | ICD-10-CM | POA: Diagnosis not present

## 2022-05-18 DIAGNOSIS — R2689 Other abnormalities of gait and mobility: Secondary | ICD-10-CM | POA: Diagnosis not present

## 2022-05-18 DIAGNOSIS — M6281 Muscle weakness (generalized): Secondary | ICD-10-CM

## 2022-05-18 DIAGNOSIS — G8929 Other chronic pain: Secondary | ICD-10-CM

## 2022-05-18 DIAGNOSIS — M25561 Pain in right knee: Secondary | ICD-10-CM | POA: Diagnosis not present

## 2022-05-18 DIAGNOSIS — M25562 Pain in left knee: Secondary | ICD-10-CM | POA: Diagnosis not present

## 2022-05-20 ENCOUNTER — Inpatient Hospital Stay: Payer: 59 | Attending: Internal Medicine

## 2022-05-20 ENCOUNTER — Inpatient Hospital Stay (HOSPITAL_BASED_OUTPATIENT_CLINIC_OR_DEPARTMENT_OTHER): Payer: 59 | Admitting: Internal Medicine

## 2022-05-20 ENCOUNTER — Encounter: Payer: Self-pay | Admitting: Internal Medicine

## 2022-05-20 VITALS — BP 121/72 | HR 76 | Temp 98.7°F | Resp 20 | Wt 160.4 lb

## 2022-05-20 DIAGNOSIS — D696 Thrombocytopenia, unspecified: Secondary | ICD-10-CM | POA: Insufficient documentation

## 2022-05-20 LAB — CBC WITH DIFFERENTIAL/PLATELET
Abs Immature Granulocytes: 0.01 10*3/uL (ref 0.00–0.07)
Basophils Absolute: 0.1 10*3/uL (ref 0.0–0.1)
Basophils Relative: 1 %
Eosinophils Absolute: 0.2 10*3/uL (ref 0.0–0.5)
Eosinophils Relative: 4 %
HCT: 37.2 % (ref 36.0–46.0)
Hemoglobin: 12.1 g/dL (ref 12.0–15.0)
Immature Granulocytes: 0 %
Lymphocytes Relative: 40 %
Lymphs Abs: 2.3 10*3/uL (ref 0.7–4.0)
MCH: 29.2 pg (ref 26.0–34.0)
MCHC: 32.5 g/dL (ref 30.0–36.0)
MCV: 89.6 fL (ref 80.0–100.0)
Monocytes Absolute: 0.6 10*3/uL (ref 0.1–1.0)
Monocytes Relative: 10 %
Neutro Abs: 2.6 10*3/uL (ref 1.7–7.7)
Neutrophils Relative %: 45 %
Platelets: 93 10*3/uL — ABNORMAL LOW (ref 150–400)
RBC: 4.15 MIL/uL (ref 3.87–5.11)
RDW: 13.4 % (ref 11.5–15.5)
WBC: 5.7 10*3/uL (ref 4.0–10.5)
nRBC: 0 % (ref 0.0–0.2)

## 2022-05-20 LAB — BASIC METABOLIC PANEL
Anion gap: 8 (ref 5–15)
BUN: 23 mg/dL — ABNORMAL HIGH (ref 6–20)
CO2: 28 mmol/L (ref 22–32)
Calcium: 9.3 mg/dL (ref 8.9–10.3)
Chloride: 99 mmol/L (ref 98–111)
Creatinine, Ser: 0.58 mg/dL (ref 0.44–1.00)
GFR, Estimated: >60 mL/min (ref >60)
Glucose, Bld: 89 mg/dL (ref 70–99)
Potassium: 4.4 mmol/L (ref 3.5–5.1)
Sodium: 135 mmol/L (ref 135–145)

## 2022-05-20 NOTE — Progress Notes (Signed)
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  Alone.  Katherine Tanner 61 y.o.  female patient with longstanding history of intermittent thrombocytopenia clinically ITP is here for follow-up.  Patient has not had any further bleeding.   Review of Systems  Constitutional:  Negative for chills, diaphoresis, fever and weight loss.  HENT:  Negative for nosebleeds and sore throat.   Eyes:  Negative for double vision.  Respiratory:  Negative for cough, hemoptysis, sputum production, shortness of breath and wheezing.   Cardiovascular:  Negative for chest pain, palpitations, orthopnea and leg swelling.  Gastrointestinal:  Negative for abdominal pain, blood in stool, constipation, diarrhea, heartburn, melena, nausea and vomiting.  Genitourinary:  Negative for dysuria, frequency and urgency.  Musculoskeletal:  Positive for back pain and joint pain.  Skin: Negative.  Negative for itching and rash.  Neurological:  Negative for dizziness, tingling, focal weakness, weakness and headaches.  Endo/Heme/Allergies:  Does not bruise/bleed easily.  Psychiatric/Behavioral:  Negative for depression. The patient is not nervous/anxious and does not have insomnia.      MEDICAL HISTORY:  Past Medical History:  Diagnosis Date   Allergy    takes Claritin daily and Flonase daily   Arthritis    Asthma    uses Albuterol daily as needed;Singulair nightly;DUlera daily   Back pain    Constipation    Fatty liver    Food allergy    GERD (gastroesophageal reflux disease)    occasionally will take OTC meds but states  she just watches what she eats   H/O hiatal hernia    History of bronchitis    last time 58yr ago   History of colon polyps    History of kidney stones    History of shingles    Hyperlipidemia    Hypertension    takes Hyzaar daily   Joint pain    Joint pain    Joint swelling    Obesity    Osteoarthritis    Pneumonia    hx of;last time at least 89yrago   PONV (postoperative nausea and vomiting)    Prediabetes    Shortness of breath    Sleep apnea    Swelling of lower extremity    Trouble in sleeping    Wheezing     SURGICAL HISTORY: Past Surgical History:  Procedure Laterality Date   BREAST EXCISIONAL BIOPSY Right    CARDIAC CATHETERIZATION     COLONOSCOPY     ESOPHAGOGASTRODUODENOSCOPY     KNEE SURGERY Right    arthroscopy   Right ganglion cyst     x 2   right lumpectomy  around [address] [place] 11/15/2013   Procedure: Marcaine/STEROID INJECTION;  Surgeon: JoAlta CorningMD;  [facility];  Laterality: Left;   TOTAL KNEE ARTHROPLASTY Right 11/15/2013   Procedure: RIGHT TOTAL KNEE ARTHROPLASTY;  Surgeon: JoAlta CorningMD;  [facility];  Laterality: Right;   TOTAL KNEE ARTHROPLASTY Left 12/30/2016   Procedure: TOTAL KNEE ARTHROPLASTY;  Surgeon: GrDorna LeitzMD;  [facility];  Laterality: Left;   TUBAL LIGATION  SOCIAL HISTORY: Social History   Socioeconomic History   Marital status: Married    Spouse name: Legrand Como   Number of children: 1   Years of education: Not on file   Highest education level: Not on file  Occupational History   Occupation: Tax adviser: [employer]    Employer: [employer]  Tobacco Use   Smoking status: Never   Smokeless tobacco: Never  Vaping Use   Vaping Use: Never used  Substance and Sexual Activity   Alcohol use: No    Alcohol/week: 0.0 standard drinks of alcohol   Drug use: No   Sexual activity: Yes  Other Topics  Concern   Not on file  Social History Narrative   Lives in [place]; works at [employer].  No alcohol.   Social Determinants of Health   Financial Resource Strain: Not on file  Food Insecurity: Not on file  Transportation Needs: Not on file  Physical Activity: Not on file  Stress: Not on file  Social Connections: Not on file  Intimate Partner Violence: Not on file    FAMILY HISTORY: Family History  Problem Relation Age of Onset   Diabetes Father    Cancer Father    Liver disease Father    Alcoholism Father    Sarcoidosis Mother    Glaucoma Mother    Hypertension Mother    Hyperlipidemia Mother    Sleep apnea Mother    Obesity Mother    Breast cancer Neg Hx     ALLERGIES:  is allergic to ace inhibitors, amoxicillin-pot clavulanate, influenza virus vacc split pf, and shellfish allergy.  MEDICATIONS:  Current Outpatient Medications  Medication Sig Dispense Refill   baclofen (LIORESAL) 10 MG tablet Take 1 tablet by mouth 2 times daily as needed. 60 tablet 3   baclofen (LIORESAL) 10 MG tablet TAKE 1 TABLET BY MOUTH TWICE DAILY AS NEEDED 60 tablet 3   baclofen (LIORESAL) 10 MG tablet Take 1 tablet (10 mg total) by mouth 2 (two) times daily as needed. 60 tablet 3   Calcium Citrate-Vitamin D (CALCIUM + D PO) Take 1 tablet by mouth daily.     Cholecalciferol (VITAMIN D3) 25 MCG (1000 UT) CAPS Take 1 capsule (1,000 Units total) by mouth daily. 30 capsule 0   diclofenac Sodium (VOLTAREN) 1 % GEL Apply 4 grams to skin four times a day 960 g 5   DULoxetine (CYMBALTA) 60 MG capsule Take 1 capsule by mouth at bedtime 30 capsule 1   DULoxetine (CYMBALTA) 60 MG capsule Take 1 capsule (60 mg total) by mouth at bedtime. 30 capsule 1   DULoxetine (CYMBALTA) 60 MG capsule Take 1 capsule (60 mg total) by mouth at bedtime. 30 capsule 1   EPINEPHRINE 0.3 mg/0.3 mL IJ SOAJ injection INJECT 1 SYRINGE INTO THE MUSCLE ONCE 1 each 1   fluticasone-salmeterol (ADVAIR DISKUS) 250-50 MCG/ACT AEPB  Inhale 1 puff into the lungs 2 times daily then rinse mouth 60 each 10   ipratropium-albuterol (DUONEB) 0.5-2.5 (3) MG/3ML SOLN USE 1 VIAL VIA NEBULIZER EVERY 6 HOURS AS NEEDED 90 mL 5   loratadine (CLARITIN) 10 MG tablet Take 10 mg by mouth daily.     losartan-hydrochlorothiazide (HYZAAR) 100-25 MG tablet TAKE 1 TABLET BY MOUTH ONCE DAILY 90 tablet 2   meloxicam (MOBIC) 15 MG tablet Take 1 tablet by mouth once a day 30 tablet 1   meloxicam (MOBIC) 15 MG tablet Take 1 tablet (15 mg total) by mouth  daily as needed for pain 30 tablet 1   metFORMIN (GLUCOPHAGE) 500 MG tablet Take 1 tablet by mouth 2 times daily (with breakfast and with lunch.) 180 tablet 0   montelukast (SINGULAIR) 10 MG tablet TAKE 1 TABLET BY MOUTH AT BEDTIME 90 tablet 3   Multiple Vitamin (MULTIVITAMIN PO) Take 1 tablet by mouth daily.     Nebulizers (COMPRESSOR/NEBULIZER) MISC Use as directed 1 each 0   rosuvastatin (CRESTOR) 10 MG tablet TAKE 1 TABLET BY MOUTH DAILY 90 tablet 3   traMADol (ULTRAM) 50 MG tablet Take 1-2 tablets (50-100 mg total) by mouth 4 (four) times daily as needed for pain 240 tablet 1   traMADol (ULTRAM) 50 MG tablet Take 1 - 2 tablets by mouth 4 times a day as needed for pain. 240 tablet 1   traMADol (ULTRAM) 50 MG tablet TAKE 1-2 TABLETS BY MOUTH 4 TIMES A DAY AS NEEDED FOR PAIN 240 tablet 1   traMADol (ULTRAM) 50 MG tablet Take 1 - 2 tablets by mouth 4 times a day as needed for pain 240 tablet 1   traMADol (ULTRAM) 50 MG tablet Take 1-2 tablets (50-100 mg total) by mouth 4 (four) times daily as needed for pain 240 tablet 1   traMADol (ULTRAM) 50 MG tablet Take 1-2 tablets (50-100 mg total) by mouth 4 (four) times daily as needed for pain 240 tablet 1   No current facility-administered medications for this visit.    PHYSICAL EXAMINATION:  Vitals:   05/20/22 1451  BP: 121/72  Pulse: 76  Resp: 20  Temp: 98.7 F (37.1 C)  SpO2: 100%   Filed Weights   05/20/22 1451  Weight: 160 lb 6.4 oz (72.8  kg)    Physical Exam Vitals and nursing note reviewed.  HENT:     Head: Normocephalic and atraumatic.     Mouth/Throat:     Pharynx: Oropharynx is clear.  Eyes:     Extraocular Movements: Extraocular movements intact.     Pupils: Pupils are equal, round, and reactive to light.  Cardiovascular:     Rate and Rhythm: Normal rate and regular rhythm.  Pulmonary:     Comments: Decreased breath sounds bilaterally.  Abdominal:     Palpations: Abdomen is soft.  Musculoskeletal:        General: Normal range of motion.     Cervical back: Normal range of motion.  Skin:    General: Skin is warm.  Neurological:     General: No focal deficit present.     Mental Status: She is alert and oriented to person, place, and time.  Psychiatric:        Behavior: Behavior normal.        Judgment: Judgment normal.      LABORATORY DATA:  I have reviewed the data as listed Lab Results  Component Value Date   WBC 5.7 05/20/2022   HGB 12.1 05/20/2022   HCT 37.2 05/20/2022   MCV 89.6 05/20/2022   PLT 93 (L) 05/20/2022   Recent Labs    07/09/21 0735 09/28/21 1026 10/20/21 1426 11/17/21 1420 03/28/22 1250 05/20/22 1435  NA 142 145* 138 140 145* 135  K 4.1 4.4 4.3 4.4 4.1 4.4  CL 105 103 102 101 101 99  CO2 33* '27 30 31 26 28  '$ GLUCOSE 97 88 90 89 93 89  BUN 17 15 23* 28* 13 23*  CREATININE 0.56 0.63 0.59 0.66 0.51* 0.58  CALCIUM 9.5 9.7 9.3 9.4 9.9 9.3  GFRNONAA  --   --  >60 >60  --  >60  PROT 6.6 7.0  --   --  7.1  --   ALBUMIN 4.3 4.9  --   --  4.6  --   AST 22 24  --   --  26  --   ALT 31 27  --   --  38*  --   ALKPHOS 97 115  --   --  131*  --   BILITOT 0.6 0.5  --   --  0.3  --      RADIOGRAPHIC STUDIES: I have personally reviewed the radiological images as listed and agreed with the findings in the report. MM Digital Diagnostic Unilat L  Result Date: 05/05/2022 CLINICAL DATA:  Screening recall for left breast calcifications. EXAM: DIGITAL DIAGNOSTIC UNILATERAL LEFT  MAMMOGRAM WITH CAD TECHNIQUE: Left digital diagnostic mammography was performed. COMPARISON:  Previous exam(s). ACR Breast Density Category c: The breast tissue is heterogeneously dense, which may obscure small masses. FINDINGS: There is subtle group linear punctate calcifications in the posterior, lower, outer left breast, that appear vascular, but are not definitively vascular. There is no associated mass distortion. The span 6 mm. IMPRESSION: Small group of probably benign left breast calcifications, that are likely vascular. Short-term follow-up recommended. RECOMMENDATION: Diagnostic left breast mammography with magnification views in 6 months I have discussed the findings and recommendations with the patient. If applicable, a reminder letter will be sent to the patient regarding the next appointment. BI-RADS CATEGORY  3: Probably benign. Electronically Signed   By: Lajean Manes M.D.   On: 05/05/2022 16:18  MM 3D SCREEN BREAST BILATERAL  Result Date: 04/28/2022 CLINICAL DATA:  Screening. EXAM: DIGITAL SCREENING BILATERAL MAMMOGRAM WITH TOMOSYNTHESIS AND CAD TECHNIQUE: Bilateral screening digital craniocaudal and mediolateral oblique mammograms were obtained. Bilateral screening digital breast tomosynthesis was performed. The images were evaluated with computer-aided detection. COMPARISON:  Previous exam(s). ACR Breast Density Category c: The breast tissue is heterogeneously dense, which may obscure small masses. FINDINGS: In the left breast, calcifications warrant further evaluation. In the right breast, no findings suspicious for malignancy. IMPRESSION: Further evaluation is suggested for calcifications in the left breast. RECOMMENDATION: Diagnostic mammogram of the left breast. (Code:FI-L-75M) The patient will be contacted regarding the findings, and additional imaging will be scheduled. BI-RADS CATEGORY  0: Incomplete. Need additional imaging evaluation and/or prior mammograms for comparison.  Electronically Signed   By: Lovey Newcomer M.D.   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    #Patient is currently asymptomatic.  Platelets today 93.  She has been getting platelet check every 2 months which has been stable around 80.  Denies any bleeding.     She takes meloxicam occasionally.  But no consistent use of NSAIDs.  Considering her platelets have been stable she can have recheck in 3 months. Advised to look for any bleeding.   # DISPOSITION: # 3 months lab only #6 months with Dr. B and labs  All questions were answered. The patient knows to call the clinic with any problems, questions or concerns.       Jane Canary, MD 05/20/2022 3:08 PM

## 2022-05-23 ENCOUNTER — Encounter: Payer: Self-pay | Admitting: Physical Therapy

## 2022-05-23 ENCOUNTER — Ambulatory Visit (INDEPENDENT_AMBULATORY_CARE_PROVIDER_SITE_OTHER): Payer: 59 | Admitting: Family Medicine

## 2022-05-23 ENCOUNTER — Ambulatory Visit: Payer: 59 | Admitting: Physical Therapy

## 2022-05-23 DIAGNOSIS — M6281 Muscle weakness (generalized): Secondary | ICD-10-CM | POA: Diagnosis not present

## 2022-05-23 DIAGNOSIS — M5459 Other low back pain: Secondary | ICD-10-CM

## 2022-05-23 DIAGNOSIS — M25562 Pain in left knee: Secondary | ICD-10-CM | POA: Diagnosis not present

## 2022-05-23 DIAGNOSIS — G8929 Other chronic pain: Secondary | ICD-10-CM

## 2022-05-23 DIAGNOSIS — R2689 Other abnormalities of gait and mobility: Secondary | ICD-10-CM | POA: Diagnosis not present

## 2022-05-23 DIAGNOSIS — M25561 Pain in right knee: Secondary | ICD-10-CM | POA: Diagnosis not present

## 2022-05-23 NOTE — Addendum Note (Signed)
Addended by: Harvel Ricks A on: 05/23/2022 02:17 PM   Modules accepted: Orders

## 2022-05-23 NOTE — Therapy (Signed)
OUTPATIENT PHYSICAL THERAPY TREATMENT NOTE   Patient Name: Katherine Tanner MRN: [id number] DOB:December 19, 1960, 61 y.o., female Today's Date: 05/23/2022  PCP: Abner Greenspan, MD   REFERRING PROVIDER: Margaretha Sheffield, MD  END OF SESSION:   PT End of Session - 05/23/22 1420     Visit Number 12    Number of Visits 17    Date for PT Re-Evaluation 06/06/22    Authorization Type  employee    Authorization - Visit Number 12    Authorization - Number of Visits 25                       Past Medical History:  Diagnosis Date   Allergy    takes Claritin daily and Flonase daily   Arthritis    Asthma    uses Albuterol daily as needed;Singulair nightly;DUlera daily   Back pain    Constipation    Fatty liver    Food allergy    GERD (gastroesophageal reflux disease)    occasionally will take OTC meds but states she just watches what she eats   H/O hiatal hernia    History of bronchitis    last time 55yr ago   History of colon polyps    History of kidney stones    History of shingles    Hyperlipidemia    Hypertension    takes Hyzaar daily   Joint pain    Joint pain    Joint swelling    Obesity    Osteoarthritis    Pneumonia    hx of;last time at least 89yrago   PONV (postoperative nausea and vomiting)    Prediabetes    Shortness of breath    Sleep apnea    Swelling of lower extremity    Trouble in sleeping    Wheezing    Past Surgical History:  Procedure Laterality Date   BREAST EXCISIONAL BIOPSY Right    CARDIAC CATHETERIZATION     COLONOSCOPY     ESOPHAGOGASTRODUODENOSCOPY     KNEE SURGERY Right    arthroscopy   Right ganglion cyst     x 2   right lumpectomy  around [address] 11/15/2013   Procedure: Marcaine/STEROID INJECTION;  Surgeon: JoAlta CorningMD;  [facility]: [facility];  Laterality: Left;   TOTAL KNEE ARTHROPLASTY Right 11/15/2013   Procedure: RIGHT TOTAL KNEE ARTHROPLASTY;  Surgeon:  JoAlta CorningMD;  [facility];  Laterality: Right;   TOTAL KNEE ARTHROPLASTY Left 12/30/2016   Procedure: TOTAL KNEE ARTHROPLASTY;  Surgeon: GrDorna LeitzMD;  [facility];  Laterality: Left;   TUBAL LIGATION     Patient Active Problem List   Diagnosis Date Noted   Chronic pain syndrome 02/09/2022   Left ankle pain 01/12/2021   Overweight with body mass index (BMI) of 29 to 29.9 in adult 08/17/2020   At risk for diabetes mellitus 0134/28/7681 Nonalcoholic hepatosteatosis 0115/72/6203 At risk for heart disease 07/06/2020   Transaminitis 04/07/2020   Thrombocytopenia ([place]/10/[place]   Insulin resistance 06/19/2019   Vitamin D deficiency 11/05/2018   Obstructive sleep apnea 10/10/2017   Primary osteoarthritis of left knee 12/30/2016   Class 1 obesity with serious comorbidity and body mass index (BMI) of 34.0 to 34.9 in adult 05/24/2016   Prediabetes 02/24/2014   Osteoarthritis of right knee 11/15/2013   Osteoarthritis of left knee 11/15/2013  Alkaline phosphatase elevation 01/02/2012   Hyperlipidemia 01/02/2012   Routine general medical examination at a health care facility 12/09/2011   Allergy to influenza vaccine 05/02/2011   POLYARTHRITIS 01/28/2010   Chronic pain of both knees 12/11/2009   ANGIOEDEMA 03/27/2007   Essential hypertension 03/19/2007   Perennial allergic rhinitis with seasonal variation 03/19/2007   Asthma, moderate persistent 03/19/2007   GERD 03/19/2007   HIATAL HERNIA 03/19/2007   GESTATIONAL DIABETES 03/19/2007    REFERRING DIAG:  "LBP, B knee pain, lumbar spondylosis, h/o B TKA"  THERAPY DIAG:  Other low back pain  Chronic pain of right knee  Other abnormalities of gait and mobility  Rationale for Evaluation and Treatment Rehabilitation  PERTINENT HISTORY: Asthma, GERD, HTN, hx hiatal hernia  PRECAUTIONS: none  SUBJECTIVE:  " I didn't do any work today so I am doing better. When I work I  do a lot of repetitive bending forward maybe this causing issues. The heel lift is still helping me with my walking pattern."  PAIN:  Are you having pain: yes, 4/10 Location: back and B knees, right around sacrum How would you describe your pain? Achey, Best in past week: 4/10 Worst in past week: 10/10 (takes a couple hours to settle) Aggravating factors: prolonged walking/standing, difficulty bending/lifting, stair navigation Easing factors: medication, ice, movement/stretches, TENS   OBJECTIVE: (objective measures completed at initial evaluation unless otherwise dated)   DIAGNOSTIC FINDINGS:  Per 03/08/22 lumbar MRI:  IMPRESSION: Mild, facet predominant degenerative changes as detailed above are similar to the prior study. No significant stenosis.   Possible fibroid uterus. This could be further evaluated with ultrasound.   PATIENT SURVEYS:  FOTO 59% 05/04/2022: 52% 05/16/2022: 52%   SCREENING FOR RED FLAGS: Red flag questioning unremarkable   COGNITION:           Overall cognitive status: Within functional limits for tasks assessed                          SENSATION: Light touch intact B LE, some increased sensitivity R medial knee pt states has been present since TKA     POSTURE: No Significant postural limitations   PALPATION: Tightness/TTP B quads, more notable medially TTP B QL and lumbar paraspinals, L more than R. TTP L glutes/piriformis. Concordant discomfort with grade 1-2 PA SI mobilizations, no change with repetition     LUMBAR ROM:    Active  A/PROM  eval  Flexion Mid shin, p!  Extension 75%, mild tenderness   Right lateral flexion    Left lateral flexion    Right rotation 75%  Left rotation 75% more pull   (Blank rows = not tested) Comment: able to achieve ~3 more inches of flexion after 5 reps repeated extension, about same amount of pain   LOWER EXTREMITY ROM:      Active  Right eval Left eval  Hip flexion      Hip extension      Hip  internal rotation      Hip external rotation       (Blank rows = not tested)   Comments:     LOWER EXTREMITY MMT:     MMT Right eval Left eval  Hip flexion 4 4  Hip abduction (modified sitting) 5 5  Hip internal rotation 4 4  Hip external rotation 4- 4-  Knee flexion 5 5  Knee extension 4+ 4+   (Blank rows = not tested)   Comments:  mild pain in B knees with extension and hip ER     FUNCTIONAL TESTS:  5xSTS standard chair: 17sec no UE support, reduced velocity with repetition 5xSTS 05/18/22 14sec minimal UE support on knees, consistent velocity     05/11/2022   LLD assessment    True   R 90 cm   L 86.5 cm   Apparent   R 91.5   L 91.7     GAIT: Distance walked: within clinic Assistive device utilized: None Level of assistance: Complete Independence Comments: reduced step length B, reduced hip ext, reduced truncal ROM and arm swing B, reduced gait speed and cadence       TODAY'S TREATMENT   [facility] Adult PT Treatment:                                                DATE: 05/23/2022 Therapeutic Exercise: Nu-step L6 x 5 min UE/LE Sideling hip abduction 2 x 12 bil followed with 1 set 10 of CW/CCW circles ea R sidelying L hip flexor stretch 2 x 30 sec Prone hamstring curl with hip extension  2 x 10 with 5# LLE only Standing UE press down into red physioball 2 x 10  Dead lifting from 6 inch step with 10# KB 3 x10  Therapeutic Activity: Lifting mechanics using proper form and avoiding rounding at the back     [facility] Adult PT Treatment:                                                DATE: 05/18/22 Therapeutic Exercise: Marcello Moores stretch 2x30sec B LE 5xSTS BW, 2x8 w 10# KB Supine hip flex + contralateral ext manual resistance for lumbopelvic endurance, 2x5 GTB paloff press seated on dynadisc 2x10 Dynadisc seated march holding 10# KB 2x8 each LE cues for posture and velocity  Manual Therapy: Prone STM B QL, lumbar and thoracic paraspinals, proximal glutes Pin and  stretch hip rotators, prone, IR/ER passively    [facility] Adult PT Treatment:                                                DATE: 05/16/2022 Therapeutic Exercise: Prone hip flexor stretch PNF contract with 10 sec contraction. Prone hamstring curl with hip extension 2 x 10, with 3# LLE Supine clamshell 2 x 15 with GTB Sit to stand 2 x 10 with controlled eccentric loading x 5 seconds LTR 1 x 10 holding postion x 10 secs with LLE rotated to the L Seated on dynadisc anterior pelvic tilt with abdominal draw in maneuver 1 x 10 holding 10 seconds Palloff press with GTB while seated on dynadisc 1 x 12 bil while maintaining anterior pelvic tilt Nu-step L6 x 5 min UE/LE only  Manual Therapy: MTPR along the L lumbar paraspinals  and L QL.     PATIENT EDUCATION:  Education details: Rationale for interventions, HEP  Person educated: Patient Education method: Explanation, Demonstration, Tactile cues, Verbal cues Education comprehension: verbalized understanding, returned demonstration, verbal cues required, tactile cues required, and needs further education      HOME EXERCISE PROGRAM:  Access Code: [email or web address] URL: [email or web address] Date: 05/11/2022 Prepared by: Starr Lake  Exercises - Sit to Stand with Armchair  - 1 x daily - 7 x weekly - 3 sets - 10 reps - Seated Flexion Stretch  - 1 x daily - 7 x weekly - 3 sets - 10 reps - Supine Posterior Pelvic Tilt  - 1 x daily - 7 x weekly - 1 sets - 10 reps - 10 sec hold hold - Supine Bridge with Mini Swiss Ball Between Knees  - 1 x daily - 7 x weekly - 3 sets - 10 reps - Supine Lower Trunk Rotation  - 1 x daily - 7 x weekly - 1 sets - 5 reps - 20 sec  hold - Supine Piriformis Stretch with Foot on Ground  - 1 x daily - 7 x weekly - 1 sets - 3 reps - 30sec hold - Supine Quadriceps Stretch with Strap on Table  - 2 x daily - 7 x weekly - 2 sets - 2 reps - 30 hold - Standing Hip Flexor Stretch (Mirrored)  - 2 x daily - 7 x weekly - 2  sets - 2 reps - 30 hold - Seated Hamstring Curls with Resistance  - 1 x daily - 7 x weekly - 2 sets - 10 reps ASSESSMENT:   CLINICAL IMPRESSION: Pt arrives to session noting minimal pain today at 4/10 due to not having to work today. Focused session on core and LE strengthening. Remainder of the session was focused on lfting mechanics to mimic work related tasks to reduce Repetive trunk flexion and use her hips and knees more.    OBJECTIVE IMPAIRMENTS Abnormal gait, decreased activity tolerance, decreased balance, decreased endurance, decreased mobility, difficulty walking, decreased ROM, decreased strength, hypomobility, impaired flexibility, and pain.    ACTIVITY LIMITATIONS carrying, lifting, bending, sitting, standing, squatting, stairs, transfers, and dressing   PARTICIPATION LIMITATIONS: meal prep, cleaning, laundry, driving, shopping, community activity, and occupation   PERSONAL FACTORS Time since onset of injury/illness/exacerbation and 1-2 comorbidities: HTN, asthma  are also affecting patient's functional outcome.      GOALS: Goals reviewed with patient? No due to time constraints   SHORT TERM GOALS: Target date: 05/09/2022   Pt will demonstrate appropriate understanding and performance of initially prescribed HEP in order to facilitate improved independence with management of symptoms.  Baseline: HEP provided on eval 04-21-22  HEP updated Goal status: MET 05/16/2022   2. Pt will score greater than or equal to 64 on FOTO in order to demonstrate improved perception of function due to symptoms.            Baseline: 59  05/04/2022: 52            Goal status: ONGOING   LONG TERM GOALS: Target date: 06/06/2022   Pt will score 68 on FOTO in order to demonstrate improved perception of functional status due to symptoms.  Baseline: 59 05/04/2022: 52 Goal status: IN PROGRESS   2.  Pt will demonstrate 100% lumbar flexion AROM with less than 3/10 pain on NPS in order to demonstrate  improved tolerance to typical ADLs such as lower body dressing and lifting.  Baseline: see ROM chart Goal status: INITIAL   3.  Pt will demonstrate hip IR/ER MMT of 4+ /5 in order to demonstrate improved safety/stability with functional transfers. Baseline: see MMT chart Goal status: INITIAL   4. Pt will perform 5xSTS in <12 sec in order to demonstrate reduced fall risk and improved  functional independence. (MCID of 2.3sec)            Baseline: 17sec  05/18/22: 14 sec            Goal status: ONGOING     PLAN: PT FREQUENCY: 1-2x/week   PT DURATION: 8 weeks   PLANNED INTERVENTIONS: Therapeutic exercises, Therapeutic activity, Neuromuscular re-education, Balance training, Gait training, Patient/Family education, Self Care, Joint mobilization, Stair training, Aquatic Therapy, Dry Needling, Electrical stimulation, Spinal mobilization, Cryotherapy, Moist heat, Manual therapy, and Re-evaluation.   PLAN FOR NEXT SESSION:  trial SI belt, continue lumbopelvic stability exercises and functional strengthening, LE strengthening and functional lifting.    Esiah Bazinet PT, DPT, LAT, [name]  05/23/22  3:01 PM

## 2022-05-24 ENCOUNTER — Other Ambulatory Visit (HOSPITAL_COMMUNITY): Payer: Self-pay

## 2022-05-24 ENCOUNTER — Other Ambulatory Visit: Payer: Self-pay | Admitting: Internal Medicine

## 2022-05-24 MED ORDER — ALBUTEROL SULFATE HFA 108 (90 BASE) MCG/ACT IN AERS
2.0000 | INHALATION_SPRAY | Freq: Four times a day (QID) | RESPIRATORY_TRACT | 12 refills | Status: DC | PRN
  Filled 2022-05-24: qty 6.7, 25d supply, fill #0
  Filled 2022-07-06: qty 6.7, 25d supply, fill #1
  Filled 2022-09-26: qty 6.7, 25d supply, fill #2

## 2022-05-24 NOTE — Therapy (Signed)
OUTPATIENT PHYSICAL THERAPY TREATMENT NOTE   Patient Name: Katherine Tanner MRN: [id number] DOB:Sep 19, 1960, 61 y.o., female Today's Date: 05/25/2022  PCP: Abner Greenspan, MD   REFERRING PROVIDER: Margaretha Sheffield, MD  END OF SESSION:   PT End of Session - 05/25/22 1414     Visit Number 13    Number of Visits 17    Date for PT Re-Evaluation 06/06/22    Authorization Type [employer] employee    Authorization Time Period auth required after 25th visit    Authorization - Visit Number 13    Authorization - Number of Visits 25    PT Start Time 8366    PT Stop Time 1458    PT Time Calculation (min) 43 min    Activity Tolerance Patient tolerated treatment well;No increased pain    Behavior During Therapy WFL for tasks assessed/performed                        Past Medical History:  Diagnosis Date   Allergy    takes Claritin daily and Flonase daily   Arthritis    Asthma    uses Albuterol daily as needed;Singulair nightly;DUlera daily   Back pain    Constipation    Fatty liver    Food allergy    GERD (gastroesophageal reflux disease)    occasionally will take OTC meds but states she just watches what she eats   H/O hiatal hernia    History of bronchitis    last time 71yr ago   History of colon polyps    History of kidney stones    History of shingles    Hyperlipidemia    Hypertension    takes Hyzaar daily   Joint pain    Joint pain    Joint swelling    Obesity    Osteoarthritis    Pneumonia    hx of;last time at least 89yrago   PONV (postoperative nausea and vomiting)    Prediabetes    Shortness of breath    Sleep apnea    Swelling of lower extremity    Trouble in sleeping    Wheezing    Past Surgical History:  Procedure Laterality Date   BREAST EXCISIONAL BIOPSY Right    CARDIAC CATHETERIZATION     COLONOSCOPY     ESOPHAGOGASTRODUODENOSCOPY     KNEE SURGERY Right    arthroscopy   Right ganglion cyst     x 2   right lumpectomy   [place] 11/15/2013   Procedure: Marcaine/STEROID INJECTION;  Surgeon: JoAlta CorningMD;  [facility];  Laterality: Left;   TOTAL KNEE ARTHROPLASTY Right 11/15/2013   Procedure: RIGHT TOTAL KNEE ARTHROPLASTY;  Surgeon: JoAlta CorningMD;  [facility] [facility];  Laterality: Right;   TOTAL KNEE ARTHROPLASTY Left 12/30/2016   Procedure: TOTAL KNEE ARTHROPLASTY;  Surgeon: GrDorna LeitzMD;  [facility];  Laterality: Left;   TUBAL LIGATION     Patient Active Problem List   Diagnosis Date Noted   Chronic pain syndrome 02/09/2022   Left ankle pain 01/12/2021   Overweight with body mass index (BMI) of 29 to 29.9 in adult 08/17/2020   At risk for diabetes mellitus 0129/47/6546 Nonalcoholic hepatosteatosis 0150/35/4656 At risk for heart disease 07/06/2020   Transaminitis 04/07/2020   Thrombocytopenia ([facility]01/10/2019   Insulin resistance 06/19/2019  Vitamin D deficiency 11/05/2018   Obstructive sleep apnea 10/10/2017   Primary osteoarthritis of left knee 12/30/2016   Class 1 obesity with serious comorbidity and body mass index (BMI) of 34.0 to 34.9 in adult 05/24/2016   Prediabetes 02/24/2014   Osteoarthritis of right knee 11/15/2013   Osteoarthritis of left knee 11/15/2013   Alkaline phosphatase elevation 01/02/2012   Hyperlipidemia 01/02/2012   Routine general medical examination at a health care facility 12/09/2011   Allergy to influenza vaccine 05/02/2011   POLYARTHRITIS 01/28/2010   Chronic pain of both knees 12/11/2009   ANGIOEDEMA 03/27/2007   Essential hypertension 03/19/2007   Perennial allergic rhinitis with seasonal variation 03/19/2007   Asthma, moderate persistent 03/19/2007   GERD 03/19/2007   HIATAL HERNIA 03/19/2007   GESTATIONAL DIABETES 03/19/2007    REFERRING DIAG:  "LBP, B knee pain, lumbar spondylosis, h/o B TKA"  THERAPY DIAG:  Other low back pain  Chronic pain  of right knee  Other abnormalities of gait and mobility  Muscle weakness (generalized)  Chronic pain of left knee  Rationale for Evaluation and Treatment Rehabilitation  PERTINENT HISTORY: Asthma, GERD, HTN, hx hiatal hernia  PRECAUTIONS: none  SUBJECTIVE:  Pt reports 8/10 pain after work today. Reports about a day of soreness after last session. States that she has tried to modify getting medication out of pneumatic tube at work but has difficulty squatting due to setup. Good compliance with HEP, notes that they help when her pain is more intense.     PAIN:  Are you having pain: yes, 8/10 Location: back and B knees, right around sacrum How would you describe your pain? [place], Best in past week: 4/10 Worst in past week: 10/10 (takes a couple hours to settle) Aggravating factors: prolonged walking/standing, difficulty bending/lifting, stair navigation Easing factors: medication, ice, movement/stretches, TENS   OBJECTIVE: (objective measures completed at initial evaluation unless otherwise dated)   DIAGNOSTIC FINDINGS:  Per 03/08/22 lumbar MRI:  IMPRESSION: Mild, facet predominant degenerative changes as detailed above are similar to the prior study. No significant stenosis.   Possible fibroid uterus. This could be further evaluated with ultrasound.   PATIENT SURVEYS:  FOTO 59% 05/04/2022: 52% 05/16/2022: 52%   SCREENING FOR RED FLAGS: Red flag questioning unremarkable   COGNITION:           Overall cognitive status: Within functional limits for tasks assessed                          SENSATION: Light touch intact B LE, some increased sensitivity R medial knee pt states has been present since TKA     POSTURE: No Significant postural limitations   PALPATION: Tightness/TTP B quads, more notable medially TTP B QL and lumbar paraspinals, L more than R. TTP L glutes/piriformis. Concordant discomfort with grade 1-2 PA SI mobilizations, no change with repetition      LUMBAR ROM:    Active  A/PROM  eval  Flexion Mid shin, p!  Extension 75%, mild tenderness   Right lateral flexion    Left lateral flexion    Right rotation 75%  Left rotation 75% more pull   (Blank rows = not tested) Comment: able to achieve ~3 more inches of flexion after 5 reps repeated extension, about same amount of pain   LOWER EXTREMITY ROM:      Active  Right eval Left eval  Hip flexion      Hip extension  Hip internal rotation      Hip external rotation       (Blank rows = not tested)   Comments:     LOWER EXTREMITY MMT:     MMT Right eval Left eval  Hip flexion 4 4  Hip abduction (modified sitting) 5 5  Hip internal rotation 4 4  Hip external rotation 4- 4-  Knee flexion 5 5  Knee extension 4+ 4+   (Blank rows = not tested)   Comments: mild pain in B knees with extension and hip ER     FUNCTIONAL TESTS:  5xSTS standard chair: 17sec no UE support, reduced velocity with repetition 5xSTS 05/18/22 14sec minimal UE support on knees, consistent velocity     05/11/2022   LLD assessment    True   R 90 cm   L 86.5 cm   Apparent   R 91.5   L 91.7     GAIT: Distance walked: within clinic Assistive device utilized: None Level of assistance: Complete Independence Comments: reduced step length B, reduced hip ext, reduced truncal ROM and arm swing B, reduced gait speed and cadence       TODAY'S TREATMENT  [facility] Adult PT Treatment:                                                DATE: 05/25/22 Therapeutic Exercise: Recumbent bike 46mn during subjective Cable column row 7# BUE close grip 2x10 Cable column shoulder extensions 3# BUE x10, x10 standing on airex w/ heels off for increased core demand, cues for form and posture KB pickup from 6inch step 3x5 cues for hinge Cybex hip flexion 12.5# 2x8 each LE w UE support Cybex hip 45 deg kickback 12.5# x12 each LE   [facility] Adult PT Treatment:                                                DATE:  05/23/2022 Therapeutic Exercise: Nu-step L6 x 5 min UE/LE Sideling hip abduction 2 x 12 bil followed with 1 set 10 of CW/CCW circles ea R sidelying L hip flexor stretch 2 x 30 sec Prone hamstring curl with hip extension  2 x 10 with 5# LLE only Standing UE press down into red physioball 2 x 10  Dead lifting from 6 inch step with 10# KB 3 x10  Therapeutic Activity: Lifting mechanics using proper form and avoiding rounding at the back     [facility]                                                [facility]: 05[facility] Therapeutic Exercise: TMarcello Mooresstretch 2x30sec B LE 5xSTS BW, 2x8 w 10# KB Supine hip flex + contralateral ext manual resistance for lumbopelvic endurance, 2x5 GTB paloff press seated on dynadisc 2x10 Dynadisc seated march holding 10# KB 2x8 each LE cues for posture and velocity  Manual Therapy: Prone STM B QL, lumbar and thoracic paraspinals, proximal glutes Pin and stretch hip rotators, prone, IR/ER passively    PATIENT EDUCATION:  Education details: Rationale for interventions, HEP  Person educated: Patient Education method: Explanation, Demonstration, Tactile cues, Verbal cues Education comprehension: verbalized understanding, returned demonstration, verbal cues required, tactile cues required, and needs further education      HOME EXERCISE PROGRAM: Access Code: [email or web address] URL: [email or web address] Date: 05/11/2022 Prepared by: Starr Lake  Exercises - Sit to Stand with Armchair  - 1 x daily - 7 x weekly - 3 sets - 10 reps - Seated Flexion Stretch  - 1 x daily - 7 x weekly - 3 sets - 10 reps - Supine Posterior Pelvic Tilt  - 1 x daily - 7 x weekly - 1 sets - 10 reps - 10 sec hold hold - Supine Bridge with Mini Swiss Ball Between Knees  - 1 x daily - 7 x weekly - 3 sets - 10 reps - Supine Lower Trunk Rotation  - 1 x daily - 7 x weekly - 1 sets - 5 reps - 20 sec  hold - Supine Piriformis Stretch with Foot on Ground  - 1 x daily - 7 x  weekly - 1 sets - 3 reps - 30sec hold - Supine Quadriceps Stretch with Strap on Table  - 2 x daily - 7 x weekly - 2 sets - 2 reps - 30 hold - Standing Hip Flexor Stretch (Mirrored)  - 2 x daily - 7 x weekly - 2 sets - 2 reps - 30 hold - Seated Hamstring Curls with Resistance  - 1 x daily - 7 x weekly - 2 sets - 10 reps ASSESSMENT:   CLINICAL IMPRESSION: Pt arrives w/ 8/10 pain on NPS after work activities, reports about a day of soreness after last session. Pt notes that she has tried to modify mechanics at work but is unable to squat at pneumatic tubes to get meds/labs due to setup of work area - subsequently focused on strengthening postural muscles and posterior chain in positions comparable to reaching into tube. Cues for form and pacing. Pt denies any increase in pain as session goes on, primary report of muscular fatigue. No adverse events. Pt departs today's session in no acute distress, all voiced questions/concerns addressed appropriately from PT perspective.     OBJECTIVE IMPAIRMENTS Abnormal gait, decreased activity tolerance, decreased balance, decreased endurance, decreased mobility, difficulty walking, decreased ROM, decreased strength, hypomobility, impaired flexibility, and pain.    ACTIVITY LIMITATIONS carrying, lifting, bending, sitting, standing, squatting, stairs, transfers, and dressing   PARTICIPATION LIMITATIONS: meal prep, cleaning, laundry, driving, shopping, community activity, and occupation   PERSONAL FACTORS Time since onset of injury/illness/exacerbation and 1-2 comorbidities: HTN, asthma  are also affecting patient's functional outcome.      GOALS: Goals reviewed with patient? No due to time constraints   SHORT TERM GOALS: Target date: 05/09/2022   Pt will demonstrate appropriate understanding and performance of initially prescribed HEP in order to facilitate improved independence with management of symptoms.  Baseline: HEP provided on eval 04-21-22  HEP  updated Goal status: MET 05/16/2022   2. Pt will score greater than or equal to 64 on FOTO in order to demonstrate improved perception of function due to symptoms.            Baseline: 59  05/04/2022: 52            Goal status: ONGOING   LONG TERM GOALS: Target date: 06/06/2022   Pt will score 68 on FOTO in order to demonstrate improved perception of functional status due to symptoms.  Baseline: 59 05/04/2022: 52 Goal status: IN PROGRESS  2.  Pt will demonstrate 100% lumbar flexion AROM with less than 3/10 pain on NPS in order to demonstrate improved tolerance to typical ADLs such as lower body dressing and lifting.  Baseline: see ROM chart Goal status: INITIAL   3.  Pt will demonstrate hip IR/ER MMT of 4+ /5 in order to demonstrate improved safety/stability with functional transfers. Baseline: see MMT chart Goal status: INITIAL   4. Pt will perform 5xSTS in <12 sec in order to demonstrate reduced fall risk and improved functional independence. (MCID of 2.3sec)            Baseline: 17sec  05/18/22: 14 sec            Goal status: ONGOING     PLAN: PT FREQUENCY: 1-2x/week   PT DURATION: 8 weeks   PLANNED INTERVENTIONS: Therapeutic exercises, Therapeutic activity, Neuromuscular re-education, Balance training, Gait training, Patient/Family education, Self Care, Joint mobilization, Stair training, Aquatic Therapy, Dry Needling, Electrical stimulation, Spinal mobilization, Cryotherapy, Moist heat, Manual therapy, and Re-evaluation.   PLAN FOR NEXT SESSION:   trial SI belt, continue lumbopelvic stability exercises and functional strengthening, LE strengthening and functional lifting.    Leeroy Cha PT, DPT 05/25/2022 2:59 PM

## 2022-05-24 NOTE — Telephone Encounter (Signed)
Albuterol inhaler refilled 

## 2022-05-25 ENCOUNTER — Other Ambulatory Visit (HOSPITAL_COMMUNITY): Payer: Self-pay

## 2022-05-25 ENCOUNTER — Ambulatory Visit: Payer: 59 | Admitting: Physical Therapy

## 2022-05-25 ENCOUNTER — Encounter: Payer: Self-pay | Admitting: Physical Therapy

## 2022-05-25 DIAGNOSIS — G8929 Other chronic pain: Secondary | ICD-10-CM

## 2022-05-25 DIAGNOSIS — R2689 Other abnormalities of gait and mobility: Secondary | ICD-10-CM | POA: Diagnosis not present

## 2022-05-25 DIAGNOSIS — M5459 Other low back pain: Secondary | ICD-10-CM

## 2022-05-25 DIAGNOSIS — M6281 Muscle weakness (generalized): Secondary | ICD-10-CM | POA: Diagnosis not present

## 2022-05-25 DIAGNOSIS — M25562 Pain in left knee: Secondary | ICD-10-CM | POA: Diagnosis not present

## 2022-05-25 DIAGNOSIS — M25561 Pain in right knee: Secondary | ICD-10-CM | POA: Diagnosis not present

## 2022-05-30 ENCOUNTER — Other Ambulatory Visit (HOSPITAL_COMMUNITY)
Admission: RE | Admit: 2022-05-30 | Discharge: 2022-05-30 | Disposition: A | Discharge status: Home / Self Care | Payer: 59 | Source: Ambulatory Visit | Attending: Obstetrics and Gynecology | Admitting: Obstetrics and Gynecology

## 2022-05-30 ENCOUNTER — Other Ambulatory Visit: Payer: Self-pay | Admitting: Obstetrics and Gynecology

## 2022-05-30 DIAGNOSIS — Z01419 Encounter for gynecological examination (general) (routine) without abnormal findings: Secondary | ICD-10-CM | POA: Diagnosis not present

## 2022-05-31 ENCOUNTER — Encounter: Payer: Self-pay | Admitting: Physical Therapy

## 2022-05-31 ENCOUNTER — Ambulatory Visit: Payer: 59 | Admitting: Physical Therapy

## 2022-05-31 ENCOUNTER — Other Ambulatory Visit (HOSPITAL_COMMUNITY): Payer: Self-pay

## 2022-05-31 DIAGNOSIS — R2689 Other abnormalities of gait and mobility: Secondary | ICD-10-CM | POA: Diagnosis not present

## 2022-05-31 DIAGNOSIS — M6281 Muscle weakness (generalized): Secondary | ICD-10-CM | POA: Diagnosis not present

## 2022-05-31 DIAGNOSIS — M25562 Pain in left knee: Secondary | ICD-10-CM | POA: Diagnosis not present

## 2022-05-31 DIAGNOSIS — M25561 Pain in right knee: Secondary | ICD-10-CM | POA: Diagnosis not present

## 2022-05-31 DIAGNOSIS — M5459 Other low back pain: Secondary | ICD-10-CM

## 2022-05-31 DIAGNOSIS — G8929 Other chronic pain: Secondary | ICD-10-CM | POA: Diagnosis not present

## 2022-05-31 LAB — CYTOLOGY - PAP
Comment: NEGATIVE
Diagnosis: NEGATIVE
High risk HPV: NEGATIVE

## 2022-05-31 NOTE — Therapy (Signed)
OUTPATIENT PHYSICAL THERAPY TREATMENT NOTE   Patient Name: Katherine Tanner MRN: [id number] DOB:Sep 16, 1960, 61 y.o., female Today's Date: 05/31/2022  PCP: Abner Greenspan, MD   REFERRING PROVIDER: Margaretha Sheffield, MD  END OF SESSION:   PT End of Session - 05/31/22 1411     Visit Number 14    Number of Visits 17    Date for PT Re-Evaluation 06/06/22    Authorization Type [employer] employee    Authorization Time Period auth required after 25th visit    Authorization - Visit Number 14    Authorization - Number of Visits 25    PT Start Time 2924    PT Stop Time 1456    PT Time Calculation (min) 45 min    Activity Tolerance Patient tolerated treatment well;No increased pain    Behavior During Therapy WFL for tasks assessed/performed                         Past Medical History:  Diagnosis Date   Allergy    takes Claritin daily and Flonase daily   Arthritis    Asthma    uses Albuterol daily as needed;Singulair nightly;DUlera daily   Back pain    Constipation    Fatty liver    Food allergy    GERD (gastroesophageal reflux disease)    occasionally will take OTC meds but states she just watches what she eats   H/O hiatal hernia    History of bronchitis    last time 6yr ago   History of colon polyps    History of kidney stones    History of shingles    Hyperlipidemia    Hypertension    takes Hyzaar daily   Joint pain    Joint pain    Joint swelling    Obesity    Osteoarthritis    Pneumonia    hx of;last time at least 279yrago   PONV (postoperative nausea and vomiting)    Prediabetes    Shortness of breath    Sleep apnea    Swelling of lower extremity    Trouble in sleeping    Wheezing    Past Surgical History:  Procedure Laterality Date   BREAST EXCISIONAL BIOPSY Right    CARDIAC CATHETERIZATION     COLONOSCOPY     ESOPHAGOGASTRODUODENOSCOPY     KNEE SURGERY Right    arthroscopy   Right ganglion cyst     x 2   right  lumpectomy  [address] 11/15/2013   Procedure: Marcaine/STEROID INJECTION;  Surgeon: JoAlta CorningMD;  [facility];  Laterality: Left;   TOTAL KNEE ARTHROPLASTY Right 11/15/2013   Procedure: RIGHT TOTAL KNEE ARTHROPLASTY;  Surgeon: JoAlta CorningMD;  [facility];  Laterality: Right;   TOTAL KNEE ARTHROPLASTY Left 12/30/2016   Procedure: TOTAL KNEE ARTHROPLASTY;  Surgeon: GrDorna LeitzMD;  [facility];  Laterality: Left;   TUBAL LIGATION     Patient Active Problem List   Diagnosis Date Noted   Chronic pain syndrome 02/09/2022   Left ankle pain 01/12/2021   Overweight with body mass index (BMI) of 29 to 29.9 in adult 08/17/2020   At risk for diabetes mellitus 0146/28/6381 Nonalcoholic hepatosteatosis 0177/05/6578 At risk for heart disease 07/06/2020   Transaminitis 04/07/2020   Thrombocytopenia (HCPope01/10/2019   Insulin resistance 06/19/2019  Vitamin D deficiency 11/05/2018   Obstructive sleep apnea 10/10/2017   Primary osteoarthritis of left knee 12/30/2016   Class 1 obesity with serious comorbidity and body mass index (BMI) of 34.0 to 34.9 in adult 05/24/2016   Prediabetes 02/24/2014   Osteoarthritis of right knee 11/15/2013   Osteoarthritis of left knee 11/15/2013   Alkaline phosphatase elevation 01/02/2012   Hyperlipidemia 01/02/2012   Routine general medical examination at a health care facility 12/09/2011   Allergy to influenza vaccine 05/02/2011   POLYARTHRITIS 01/28/2010   Chronic pain of both knees 12/11/2009   ANGIOEDEMA 03/27/2007   Essential hypertension 03/19/2007   Perennial allergic rhinitis with seasonal variation 03/19/2007   Asthma, moderate persistent 03/19/2007   GERD 03/19/2007   HIATAL HERNIA 03/19/2007   GESTATIONAL DIABETES 03/19/2007    REFERRING DIAG:  "LBP, B knee pain, lumbar spondylosis, h/o B TKA"  THERAPY DIAG:  Other low back  pain  Other abnormalities of gait and mobility  Chronic pain of right knee  Muscle weakness (generalized)  Chronic pain of left knee  Rationale for Evaluation and Treatment Rehabilitation  PERTINENT HISTORY: Asthma, GERD, HTN, hx hiatal hernia  PRECAUTIONS: none  SUBJECTIVE:  Pt arrives with 7/10 pain after work today. States she feels good with exercise and therapy but continues to have increased pain levels with work activities. Good compliance with HEP reported.  Discussed POC with pt, probable d/c next session given limited change in pain levels with work, no significant flare ups reported outside of work.     PAIN:  Are you having pain: yes, 7/10 Location: back and B knees, right around sacrum How would you describe your pain? [place]y, Best in past week: 4/10 Worst in past week: 10/10 (takes a couple hours to settle) Aggravating factors: prolonged walking/standing, difficulty bending/lifting, stair navigation Easing factors: medication, ice, movement/stretches, TENS   OBJECTIVE: (objective measures completed at initial evaluation unless otherwise dated)   DIAGNOSTIC FINDINGS:  Per 03/08/22 lumbar MRI:  IMPRESSION: Mild, facet predominant degenerative changes as detailed above are similar to the prior study. No significant stenosis.   Possible fibroid uterus. This could be further evaluated with ultrasound.   PATIENT SURVEYS:  FOTO 59% 05/04/2022: 52% 05/16/2022: 52%   SCREENING FOR RED FLAGS: Red flag questioning unremarkable   COGNITION:           Overall cognitive status: Within functional limits for tasks assessed                          SENSATION: Light touch intact B LE, some increased sensitivity R medial knee pt states has been present since TKA     POSTURE: No Significant postural limitations   PALPATION: Tightness/TTP B quads, more notable medially TTP B QL and lumbar paraspinals, L more than R. TTP L glutes/piriformis. Concordant discomfort  with grade 1-2 PA SI mobilizations, no change with repetition     LUMBAR ROM:    Active  A/PROM  eval  Flexion Mid shin, p!  Extension 75%, mild tenderness   Right lateral flexion    Left lateral flexion    Right rotation 75%  Left rotation 75% more pull   (Blank rows = not tested) Comment: able to achieve ~3 more inches of flexion after 5 reps repeated extension, about same amount of pain   LOWER EXTREMITY ROM:      Active  Right eval Left eval  Hip flexion      Hip extension  Hip internal rotation      Hip external rotation       (Blank rows = not tested)   Comments:     LOWER EXTREMITY MMT:     MMT Right eval Left eval  Hip flexion 4 4  Hip abduction (modified sitting) 5 5  Hip internal rotation 4 4  Hip external rotation 4- 4-  Knee flexion 5 5  Knee extension 4+ 4+   (Blank rows = not tested)   Comments: mild pain in B knees with extension and hip ER     FUNCTIONAL TESTS:  5xSTS standard chair: 17sec no UE support, reduced velocity with repetition 5xSTS 05/18/22 14sec minimal UE support on knees, consistent velocity     05/11/2022   LLD assessment    True   R 90 cm   L 86.5 cm   Apparent   R 91.5   L 91.7     GAIT: Distance walked: within clinic Assistive device utilized: None Level of assistance: Complete Independence Comments: reduced step length B, reduced hip ext, reduced truncal ROM and arm swing B, reduced gait speed and cadence       TODAY'S TREATMENT  [facility] Adult PT Treatment:                                                DATE: 05/31/22 Therapeutic Exercise: Recumbent bike 65mn during subjective Seated IR/ER with ball for fulcrum, 2x10 each, cues for form and setup 10# DB STS + OH Press from lowest mat, 3x5 cues for form and pacing  5# DB paloff press, standing, 2x8, cues for form and control  Runner step up 6inch step, 2x8 each LE GTB 45deg hip kickback at counter, x12 each LE   [facility]                                                 [facility]: 05/[facility]/22 Therapeutic Exercise: Recumbent bike 557m during subjective Cable column row 7# BUE close grip 2x10 Cable column shoulder extensions 3# BUE x10, x10 standing on airex w/ heels off for increased core demand, cues for form and posture KB pickup from 6inch step 3x5 cues for hinge Cybex hip flexion 12.5# 2x8 each LE w UE support Cybex hip 45 deg kickback 12.5# x12 each LE   [facility] Adult PT Treatment:                                                DATE: 05/23/2022 Therapeutic Exercise: Nu-step L6 x 5 min UE/LE Sideling hip abduction 2 x 12 bil followed with 1 set 10 of CW/CCW circles ea R sidelying L hip flexor stretch 2 x 30 sec Prone hamstring curl with hip extension  2 x 10 with 5# LLE only Standing UE press down into red physioball 2 x 10  Dead lifting from 6 inch step with 10# KB 3 x10  Therapeutic Activity: Lifting mechanics using proper form and avoiding rounding at the back      PATIENT EDUCATION:  Education details: Rationale for interventions, HEP  Person educated:  Patient Education method: Explanation, Demonstration, Tactile cues, Verbal cues Education comprehension: verbalized understanding, returned demonstration, verbal cues required, tactile cues required, and needs further education      HOME EXERCISE PROGRAM: Access Code: [email or web address] URL: [email or web address] Date: 05/11/2022 Prepared by: Starr Lake  Exercises - Sit to Stand with Armchair  - 1 x daily - 7 x weekly - 3 sets - 10 reps - Seated Flexion Stretch  - 1 x daily - 7 x weekly - 3 sets - 10 reps - Supine Posterior Pelvic Tilt  - 1 x daily - 7 x weekly - 1 sets - 10 reps - 10 sec hold hold - Supine Bridge with Mini Swiss Ball Between Knees  - 1 x daily - 7 x weekly - 3 sets - 10 reps - Supine Lower Trunk Rotation  - 1 x daily - 7 x weekly - 1 sets - 5 reps - 20 sec  hold - Supine Piriformis Stretch with Foot on Ground  - 1 x daily - 7 x weekly - 1  sets - 3 reps - 30sec hold - Supine Quadriceps Stretch with Strap on Table  - 2 x daily - 7 x weekly - 2 sets - 2 reps - 30 hold - Standing Hip Flexor Stretch (Mirrored)  - 2 x daily - 7 x weekly - 2 sets - 2 reps - 30 hold - Seated Hamstring Curls with Resistance  - 1 x daily - 7 x weekly - 2 sets - 10 reps ASSESSMENT:   CLINICAL IMPRESSION: Pt arrives w/ 7/10 pain on NPS after work. Today's session emphasizing exercises to increase independence w/ management of symptoms at home. Provided w/ GTB for hip strengthening. Cues for form and home setup, pt tolerates session well without increase in pain. No adverse events. Reports improvement in pain to 6/10 on departure, but significant improvement in stiffness. Pt departs today's session in no acute distress, all voiced questions/concerns addressed appropriately from PT perspective.       OBJECTIVE IMPAIRMENTS Abnormal gait, decreased activity tolerance, decreased balance, decreased endurance, decreased mobility, difficulty walking, decreased ROM, decreased strength, hypomobility, impaired flexibility, and pain.    ACTIVITY LIMITATIONS carrying, lifting, bending, sitting, standing, squatting, stairs, transfers, and dressing   PARTICIPATION LIMITATIONS: meal prep, cleaning, laundry, driving, shopping, community activity, and occupation   PERSONAL FACTORS Time since onset of injury/illness/exacerbation and 1-2 comorbidities: HTN, asthma  are also affecting patient's functional outcome.      GOALS: Goals reviewed with patient? No due to time constraints   SHORT TERM GOALS: Target date: 05/09/2022   Pt will demonstrate appropriate understanding and performance of initially prescribed HEP in order to facilitate improved independence with management of symptoms.  Baseline: HEP provided on eval 04-21-22  HEP updated Goal status: MET 05/16/2022   2. Pt will score greater than or equal to 64 on FOTO in order to demonstrate improved perception of  function due to symptoms.            Baseline: 59  05/04/2022: 52            Goal status: ONGOING   LONG TERM GOALS: Target date: 06/06/2022   Pt will score 68 on FOTO in order to demonstrate improved perception of functional status due to symptoms.  Baseline: 59 05/04/2022: 52 Goal status: IN PROGRESS   2.  Pt will demonstrate 100% lumbar flexion AROM with less than 3/10 pain on NPS in order to demonstrate improved tolerance to typical ADLs such as lower  body dressing and lifting.  Baseline: see ROM chart Goal status: INITIAL   3.  Pt will demonstrate hip IR/ER MMT of 4+ /5 in order to demonstrate improved safety/stability with functional transfers. Baseline: see MMT chart Goal status: INITIAL   4. Pt will perform 5xSTS in <12 sec in order to demonstrate reduced fall risk and improved functional independence. (MCID of 2.3sec)            Baseline: 17sec  05/18/22: 14 sec            Goal status: ONGOING     PLAN: PT FREQUENCY: 1-2x/week   PT DURATION: 8 weeks   PLANNED INTERVENTIONS: Therapeutic exercises, Therapeutic activity, Neuromuscular re-education, Balance training, Gait training, Patient/Family education, Self Care, Joint mobilization, Stair training, Aquatic Therapy, Dry Needling, Electrical stimulation, Spinal mobilization, Cryotherapy, Moist heat, Manual therapy, and Re-evaluation.   PLAN FOR NEXT SESSION:  update POC, consider d/c vs extension of POC. Review/update HEP   Leeroy Cha PT, DPT 05/31/2022 2:57 PM

## 2022-06-02 ENCOUNTER — Encounter: Payer: Self-pay | Admitting: Physical Therapy

## 2022-06-02 ENCOUNTER — Ambulatory Visit: Payer: 59 | Admitting: Physical Therapy

## 2022-06-02 DIAGNOSIS — M6281 Muscle weakness (generalized): Secondary | ICD-10-CM | POA: Diagnosis not present

## 2022-06-02 DIAGNOSIS — M5459 Other low back pain: Secondary | ICD-10-CM

## 2022-06-02 DIAGNOSIS — R2689 Other abnormalities of gait and mobility: Secondary | ICD-10-CM

## 2022-06-02 DIAGNOSIS — G8929 Other chronic pain: Secondary | ICD-10-CM | POA: Diagnosis not present

## 2022-06-02 DIAGNOSIS — M25561 Pain in right knee: Secondary | ICD-10-CM | POA: Diagnosis not present

## 2022-06-02 DIAGNOSIS — M25562 Pain in left knee: Secondary | ICD-10-CM | POA: Diagnosis not present

## 2022-06-02 NOTE — Therapy (Signed)
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 Patient goals were partially met. Patient is being discharged due to meeting majority of stated rehab goals although no significant changes in pain levels since starting therapy.    PCP: Abner Greenspan, MD   REFERRING PROVIDER: Margaretha Sheffield, MD  END OF SESSION:   PT End of Session - 06/02/22 1411     Visit Number 15    Number of Visits 17    Date for PT Re-Evaluation 06/06/22    Authorization Type [employer] employee    Authorization Time Period auth required after 25th visit    Authorization - Visit Number 15    Authorization - Number of Visits 25    PT Start Time 5681    PT Stop Time 1500    PT Time Calculation (min) 47 min    Activity Tolerance Patient tolerated treatment well;No increased pain    Behavior During Therapy WFL for tasks assessed/performed             Past Medical History:  Diagnosis Date   Allergy    takes Claritin daily and Flonase daily   Arthritis    Asthma    uses Albuterol daily as needed;Singulair nightly;DUlera daily   Back pain    Constipation    Fatty liver    Food allergy    GERD (gastroesophageal reflux disease)    occasionally will take OTC meds but states she just watches what she eats   H/O hiatal hernia    History of bronchitis    last time 66yr ago   History of colon polyps     History of kidney stones    History of shingles    Hyperlipidemia    Hypertension    takes Hyzaar daily   Joint pain    Joint pain    Joint swelling    Obesity    Osteoarthritis    Pneumonia    hx of;last time at least 89yrago   PONV (postoperative nausea and vomiting)    Prediabetes    Shortness of breath    Sleep apnea    Swelling of lower extremity    Trouble in sleeping    Wheezing    Past Surgical History:  Procedure Laterality Date   BREAST EXCISIONAL BIOPSY Right    CARDIAC CATHETERIZATION     COLONOSCOPY     ESOPHAGOGASTRODUODENOSCOPY     KNEE SURGERY Right    arthroscopy   Right ganglion cyst     x 2   right lumpectomy  around [place] 11/15/2013   Procedure: Marcaine/STEROID INJECTION;  Surgeon: JoAlta CorningMD;  [facility] Service: [facility]ics;  Laterality: Left;   TOTAL KNEE ARTHROPLASTY Right 11/15/2013   Procedure: RIGHT TOTAL KNEE ARTHROPLASTY;  Surgeon: JoAlta CorningMD;  [facility]: [facility];  Laterality: Right;   TOTAL KNEE ARTHROPLASTY Left 12/30/2016   Procedure: TOTAL KNEE ARTHROPLASTY;  Surgeon: GrBerenice Primas  Jenny Reichmann, MD;  Location: [place];  Service: Orthopedics;  Laterality: Left;   TUBAL LIGATION     Patient Active Problem List   Diagnosis Date Noted   Chronic pain syndrome 02/09/2022   Left ankle pain 01/12/2021   Overweight with body mass index (BMI) of 29 to 29.9 in adult 08/17/2020   At risk for diabetes mellitus 73/71/0626   Nonalcoholic hepatosteatosis 94/85/4627   At risk for heart disease 07/06/2020   Transaminitis 04/07/2020   Thrombocytopenia ([place]) 07/08/2019   Insulin resistance 06/19/2019   Vitamin D deficiency 11/05/2018   Obstructive sleep apnea 10/10/2017   Primary osteoarthritis of left knee 12/30/2016   Class 1 obesity with serious comorbidity and body mass index (BMI) of 34.0 to 34.9 in adult 05/24/2016   Prediabetes 02/24/2014   Osteoarthritis of right knee 11/15/2013    Osteoarthritis of left knee 11/15/2013   Alkaline phosphatase elevation 01/02/2012   Hyperlipidemia 01/02/2012   Routine general medical examination at a health care facility 12/09/2011   Allergy to influenza vaccine 05/02/2011   POLYARTHRITIS 01/28/2010   Chronic pain of both knees 12/11/2009   ANGIOEDEMA 03/27/2007   Essential hypertension 03/19/2007   Perennial allergic rhinitis with seasonal variation 03/19/2007   Asthma, moderate persistent 03/19/2007   GERD 03/19/2007   HIATAL HERNIA 03/19/2007   GESTATIONAL DIABETES 03/19/2007    REFERRING DIAG:  "LBP, B knee pain, lumbar spondylosis, h/o B TKA"  THERAPY DIAG:  Other low back pain  Other abnormalities of gait and mobility  Chronic pain of right knee  Muscle weakness (generalized)  Chronic pain of left knee  Rationale for Evaluation and Treatment Rehabilitation  PERTINENT HISTORY: Asthma, GERD, HTN, hx hiatal hernia  PRECAUTIONS: none  SUBJECTIVE:  Pt arrives w/ 7/10 pain, denies issues after last session. Pt notes that since beginning therapy she feels able to move better, improved balance/walking (particularly since heel lift), and better activity tolerance, but continues to have high fluctuations in pain levels that have not significantly changed since starting therapy. Pt notes work activities remain primary exacerbating activities but that she feels comfortable managing with familiar exercises which do provide improvement in stiffness/mobility.    PAIN 06/02/2022 :  Are you having pain: yes, 7/10 Location: back and B knees, right around sacrum How would you describe your pain? [place], Best in past week: 5/10 Worst in past week: 10/10 (takes a couple hours to settle) Aggravating factors: prolonged walking/standing, difficulty bending/lifting, stair navigation Easing factors: medication, ice, movement/stretches, TENS   OBJECTIVE: (objective measures completed at initial evaluation unless otherwise  dated)   DIAGNOSTIC FINDINGS:  Per 03/08/22 lumbar MRI:  IMPRESSION: Mild, facet predominant degenerative changes as detailed above are similar to the prior study. No significant stenosis.   Possible fibroid uterus. This could be further evaluated with ultrasound.   PATIENT SURVEYS:  FOTO 59% 05/04/2022: 52% 05/16/2022: 52% 06/02/22: 56%    SCREENING FOR RED FLAGS: Red flag questioning unremarkable   COGNITION:           Overall cognitive status: Within functional limits for tasks assessed                          SENSATION: Light touch intact B LE, some increased sensitivity R medial knee pt states has been present since TKA     POSTURE: No Significant postural limitations   PALPATION: Tightness/TTP B quads, more notable medially TTP B QL and lumbar paraspinals, L more than R. TTP L  glutes/piriformis. Concordant discomfort with grade 1-2 PA SI mobilizations, no change with repetition     LUMBAR ROM:    Active  A/PROM  eval AROM 06/02/22  Flexion Mid shin, p! 100%, able to touch toes no pain  Extension 75%, mild tenderness  75% soreness  Right lateral flexion     Left lateral flexion     Right rotation 75% 100% soreness  Left rotation 75% more pull 100% soreness   (Blank rows = not tested) Comment eval: able to achieve ~3 more inches of flexion after 5 reps repeated extension, about same amount of pain   LOWER EXTREMITY ROM:      Active  Right eval Left eval  Hip flexion      Hip extension      Hip internal rotation      Hip external rotation       (Blank rows = not tested)   Comments:     LOWER EXTREMITY MMT:     MMT Right eval Left eval Right/Left 06/02/22  Hip flexion 4 4 5/5  Hip abduction (modified sitting) 5 5 5/5  Hip internal rotation 4 4 4+/4+  Hip external rotation 4- 4- 4+/4+  Knee flexion 5 5 5/4+  Knee extension 4+ 4+ 5/5   (Blank rows = not tested)   Comments: mild pain in B knees with extension and hip ER Discharge comments: Mild  knee pain with hip rotation testing     FUNCTIONAL TESTS:  5xSTS standard chair: 17sec no UE support, reduced velocity with repetition 5xSTS 05/18/22 14sec minimal UE support on knees, consistent velocity  5xSTS 06/02/22: 11 sec, standard chair gentle UE support from knees    05/11/2022   LLD assessment    True   R 90 cm   L 86.5 cm   Apparent   R 91.5   L 91.7     GAIT: Distance walked: within clinic Assistive device utilized: None Level of assistance: Complete Independence Comments: reduced step length B, reduced hip ext, reduced truncal ROM and arm swing B, reduced gait speed and cadence       TODAY'S TREATMENT  [facility] Adult PT Treatment:                                                DATE: 06/02/22 Therapeutic Exercise: Recumbent bike 41mn during subjective STS x5 standard chair Seated lumbar flexion x10 Bridge + ball squeeze x10 LTR x5 B RTB paloff press looped around doorknob, x10 B, cues for home setup Seated IR/ER w/ ball and GTB x10 each Standing hip ext 45 deg kickback GTB x10 each      PATIENT EDUCATION:  Education details: Rationale for interventions, HEP review/performance, discharge education, provision of red/blue therabands for improved self management of HEP, MD follow up Person educated: Patient Education method: Explanation, Demonstration, Tactile cues, Verbal cues, handout Education comprehension: verbalized understanding, returned demonstration, verbal cues required, tactile cues required, and needs further education      HOME EXERCISE PROGRAM: Access Code: [email or web address]: [email or web address] Date: 06/02/2022 Prepared by: DEnis Slipper Exercises - Sit to Stand with Armchair  - 1 x daily - 7 x weekly - 3 sets - 10 reps - Seated Flexion Stretch  - 1 x daily - 7 x weekly - 3 sets - 10 reps - Supine Bridge with Mini Swiss BSt. HelenaBetween  Knees  - 1 x daily - 7 x weekly - 3 sets - 10 reps - Supine Lower Trunk Rotation  - 1 x daily - 7  x weekly - 1 sets - 5 reps - 20 sec  hold - Supine Quadriceps Stretch with Strap on Table  - 2 x daily - 7 x weekly - 2 sets - 2 reps - 30 hold - Standing Anti-Rotation Press with Anchored Resistance  - 1 x daily - 7 x weekly - 2 sets - 10 reps - Seated Hip Internal Rotation with Ball and Resistance  - 1 x daily - 7 x weekly - 2 sets - 10 reps - Hip Extension with Resistance Loop  - 1 x daily - 7 x weekly - 2 sets - 10 reps ASSESSMENT:   CLINICAL IMPRESSION: Pt arrives with 7/10 pain, denies issues since last session. LTGs assessed as below - after discussion decision made to discharge as despite improvements in strength/ROM pt denies any significant change in pain levels overall. Provided w/ HEP, pt able to perform independently w/ supplemental education for appropriate performance/modification, provided with therabands of multiple levels with education on appropriate modification/progression. Pt verbalizes good understanding, tolerates HEP well with no increase in pain, reports improved stiffness. No adverse events, discharge education provided. Pt departs today's session in no acute distress, all voiced questions/concerns addressed appropriately from PT perspective.      OBJECTIVE IMPAIRMENTS Abnormal gait, decreased activity tolerance, decreased balance, decreased endurance, decreased mobility, difficulty walking, decreased ROM, decreased strength, hypomobility, impaired flexibility, and pain.    ACTIVITY LIMITATIONS carrying, lifting, bending, sitting, standing, squatting, stairs, transfers, and dressing   PARTICIPATION LIMITATIONS: meal prep, cleaning, laundry, driving, shopping, community activity, and occupation   PERSONAL FACTORS Time since onset of injury/illness/exacerbation and 1-2 comorbidities: HTN, asthma  are also affecting patient's functional outcome.      GOALS: Goals reviewed with patient? No due to time constraints   SHORT TERM GOALS: Target date: 05/09/2022   Pt will  demonstrate appropriate understanding and performance of initially prescribed HEP in order to facilitate improved independence with management of symptoms.  Baseline: HEP provided on eval 04-21-22  HEP updated Goal status: MET 05/16/2022   2. Pt will score greater than or equal to 64 on FOTO in order to demonstrate improved perception of function due to symptoms.            Baseline: 59  05/04/2022: 52 06/02/22: 56%            Goal status: NOT MET   LONG TERM GOALS: Target date: 06/06/2022   Pt will score 68 on FOTO in order to demonstrate improved perception of functional status due to symptoms.  Baseline: 59 05/04/2022: 52 06/02/22: 56%  Goal status: NOT MET   2.  Pt will demonstrate 100% lumbar flexion AROM with less than 3/10 pain on NPS in order to demonstrate improved tolerance to typical ADLs such as lower body dressing and lifting.  Baseline: see ROM chart 06/02/22: see ROM chart Goal status: MET   3.  Pt will demonstrate hip IR/ER MMT of 4+ /5 in order to demonstrate improved safety/stability with functional transfers. Baseline: see MMT chart 06/02/22: see MMT chart Goal status: MET   4. Pt will perform 5xSTS in <12 sec in order to demonstrate reduced fall risk and improved functional independence. (MCID of 2.3sec)            Baseline: 17sec  05/18/22: 14 sec  06/02/22: 11sec  Goal status: MET     PLAN: Discharge as of 06/02/22 PT FREQUENCY: n/a   PT DURATION: n/a   PLANNED INTERVENTIONS: Therapeutic exercises, Therapeutic activity, Neuromuscular re-education, Balance training, Gait training, Patient/Family education, Self Care, Joint mobilization, Stair training, Aquatic Therapy, Dry Needling, Electrical stimulation, Spinal mobilization, Cryotherapy, Moist heat, Manual therapy, and Re-evaluation.   PLAN FOR NEXT SESSION:  n/a   Leeroy Cha PT, DPT 06/02/2022 4:20 PM

## 2022-06-06 ENCOUNTER — Encounter (INDEPENDENT_AMBULATORY_CARE_PROVIDER_SITE_OTHER): Payer: Self-pay | Admitting: Physician Assistant

## 2022-06-06 ENCOUNTER — Ambulatory Visit (INDEPENDENT_AMBULATORY_CARE_PROVIDER_SITE_OTHER): Payer: 59 | Admitting: Physician Assistant

## 2022-06-06 ENCOUNTER — Other Ambulatory Visit (HOSPITAL_COMMUNITY): Payer: Self-pay

## 2022-06-06 VITALS — BP 144/89 | HR 77 | Temp 98.2°F | Ht 61.0 in | Wt 152.0 lb

## 2022-06-06 DIAGNOSIS — I10 Essential (primary) hypertension: Secondary | ICD-10-CM | POA: Diagnosis not present

## 2022-06-06 DIAGNOSIS — Z6828 Body mass index (BMI) 28.0-28.9, adult: Secondary | ICD-10-CM | POA: Diagnosis not present

## 2022-06-06 DIAGNOSIS — R7303 Prediabetes: Secondary | ICD-10-CM | POA: Diagnosis not present

## 2022-06-06 DIAGNOSIS — E669 Obesity, unspecified: Secondary | ICD-10-CM | POA: Diagnosis not present

## 2022-06-06 MED ORDER — METFORMIN HCL 500 MG PO TABS
500.0000 mg | ORAL_TABLET | Freq: Two times a day (BID) | ORAL | 0 refills | Status: DC
  Filled 2022-06-06 – 2022-06-08 (×2): qty 180, 90d supply, fill #0

## 2022-06-08 ENCOUNTER — Other Ambulatory Visit (HOSPITAL_COMMUNITY): Payer: Self-pay

## 2022-06-13 ENCOUNTER — Other Ambulatory Visit (HOSPITAL_COMMUNITY): Payer: Self-pay

## 2022-06-20 NOTE — Progress Notes (Unsigned)
Chief Complaint:   OBESITY Tangi is here to discuss her progress with her obesity treatment plan along with follow-up of her obesity related diagnoses. Tuyet is on the Category 2 Plan and states she is following her eating plan approximately 100% of the time. [place] states she is doing physical therapy 45 minutes 3 times per week.  Today's visit was #: 37 Starting weight: 198 lbs Starting date: 08/30/2018 Today's weight: 152 lbs Today's date: 06/06/2022 Total lbs lost to date: 46 lbs Total lbs lost since last in-office visit: 0  Interim History: [name] has done very well maintaining weight loss.  She has been on maintenance for about 1 year.  She reports completed course of physical therapy for joint pain, but did not help much.  Continues to follow-up with pain management.   Subjective:   1. Prediabetes [name] is taking Metformin 500 mg twice a day with no side effects.  She is working on following her healthy eating plan,decreasing simple carbs, increasing protein intake and exercise as tolerated.  2. Essential hypertension [name] is taking Hyzaar with no side effects.  Her blood pressure is at goal on recheck today.  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  Assessment/Plan:   1. Prediabetes Will refill metformin 500 mg by mouth twice a day for 3 months with 0 refills.  -Refill metFORMIN (GLUCOPHAGE) 500 MG tablet; Take 1 tablet by mouth 2 times daily (with breakfast and with lunch.)  Dispense: 180 tablet; Refill: 0  2. Essential hypertension Continue healthy eating plan and exercise and medication.  3. Obesity, Current BMI 28.9 [place] is currently in the action stage of change. As such, her goal is to continue with weight loss efforts. She has agreed to the Category 2 Plan.   Exercise goals: As is.  Behavioral modification strategies: increasing lean protein intake, decreasing simple carbohydrates, and holiday eating strategies .  Mertie has agreed to  follow-up with our clinic in 6 weeks. She was informed of the importance of frequent follow-up visits to maximize her success with intensive lifestyle modifications for her multiple health conditions.   Objective:   Blood pressure (!) 144/89, pulse 77, temperature 98.2 F (36.8 C), height '5\' 1"'$  (1.549 m), weight 152 lb (68.9 kg), last menstrual period 02/20/2014, SpO2 100 %. Body mass index is 28.72 kg/m.  General: Cooperative, alert, well developed, in no acute distress. HEENT: Conjunctivae and lids unremarkable. Cardiovascular: Regular rhythm.  Lungs: Normal work of breathing. Neurologic: No focal deficits.   Lab Results  Component Value Date   CREATININE 0.58 05/20/2022   BUN 23 (H) 05/20/2022   NA 135 05/20/2022   K 4.4 05/20/2022   CL 99 05/20/2022   CO2 28 05/20/2022   Lab Results  Component Value Date   ALT 38 (H) 03/28/2022   AST 26 03/28/2022   ALKPHOS 131 (H) 03/28/2022   BILITOT 0.3 03/28/2022   Lab Results  Component Value Date   HGBA1C 5.6 03/28/2022   HGBA1C 5.6 12/20/2021   HGBA1C 5.6 09/28/2021   HGBA1C 5.8 07/09/2021   HGBA1C 5.6 06/24/2021   Lab Results  Component Value Date   INSULIN 6.7 03/28/2022   INSULIN 10.0 12/20/2021   INSULIN 3.7 09/28/2021   INSULIN 7.0 06/24/2021   INSULIN 6.9 03/22/2021   Lab Results  Component Value Date   TSH 0.91 07/09/2021   Lab Results  Component Value Date   CHOL 146 07/09/2021   HDL 61.70 07/09/2021   LDLCALC 77 07/09/2021  LDLDIRECT 147.9 12/17/2012   TRIG 35.0 07/09/2021   CHOLHDL 2 07/09/2021   Lab Results  Component Value Date   VD25OH 45.1 03/28/2022   VD25OH 67.1 12/20/2021   VD25OH 79.5 09/28/2021   Lab Results  Component Value Date   WBC 5.7 05/20/2022   HGB 12.1 05/20/2022   HCT 37.2 05/20/2022   MCV 89.6 05/20/2022   PLT 93 (L) 05/20/2022   Lab Results  Component Value Date   IRON 63 07/07/2020   Attestation Statements:   Reviewed by clinician on day of visit: allergies,  medications, problem list, medical history, surgical history, family history, social history, and previous encounter notes.  I, Brendell Tyus, am acting as transcriptionist for [employer], [place].  I have reviewed the above documentation for accuracy and completeness, and I agree with the above. -  ***

## 2022-06-24 ENCOUNTER — Other Ambulatory Visit (HOSPITAL_COMMUNITY): Payer: Self-pay

## 2022-07-05 ENCOUNTER — Other Ambulatory Visit (HOSPITAL_COMMUNITY): Payer: Self-pay

## 2022-07-05 ENCOUNTER — Other Ambulatory Visit: Payer: Self-pay

## 2022-07-05 DIAGNOSIS — M7552 Bursitis of left shoulder: Secondary | ICD-10-CM | POA: Diagnosis not present

## 2022-07-05 DIAGNOSIS — G894 Chronic pain syndrome: Secondary | ICD-10-CM | POA: Diagnosis not present

## 2022-07-05 DIAGNOSIS — M25569 Pain in unspecified knee: Secondary | ICD-10-CM | POA: Diagnosis not present

## 2022-07-05 DIAGNOSIS — G4700 Insomnia, unspecified: Secondary | ICD-10-CM | POA: Diagnosis not present

## 2022-07-05 MED ORDER — BACLOFEN 10 MG PO TABS
10.0000 mg | ORAL_TABLET | Freq: Two times a day (BID) | ORAL | 3 refills | Status: DC | PRN
  Filled 2022-07-23: qty 60, 30d supply, fill #0
  Filled 2022-08-23: qty 60, 30d supply, fill #1
  Filled 2022-09-19: qty 60, 30d supply, fill #2
  Filled 2022-10-22: qty 60, 30d supply, fill #3

## 2022-07-05 MED ORDER — DULOXETINE HCL 60 MG PO CPEP
60.0000 mg | ORAL_CAPSULE | Freq: Every day | ORAL | 1 refills | Status: DC
  Filled 2022-07-23: qty 30, 30d supply, fill #0
  Filled 2022-08-20: qty 30, 30d supply, fill #1

## 2022-07-05 MED ORDER — TRAMADOL HCL 50 MG PO TABS
ORAL_TABLET | ORAL | 1 refills | Status: DC
  Filled 2022-07-05: qty 240, 30d supply, fill #0
  Filled 2022-08-20: qty 240, 30d supply, fill #1

## 2022-07-06 ENCOUNTER — Other Ambulatory Visit (HOSPITAL_COMMUNITY): Payer: Self-pay

## 2022-07-10 ENCOUNTER — Telehealth: Payer: Self-pay | Admitting: Family Medicine

## 2022-07-10 DIAGNOSIS — E559 Vitamin D deficiency, unspecified: Secondary | ICD-10-CM

## 2022-07-10 DIAGNOSIS — I10 Essential (primary) hypertension: Secondary | ICD-10-CM

## 2022-07-10 DIAGNOSIS — R7303 Prediabetes: Secondary | ICD-10-CM

## 2022-07-10 DIAGNOSIS — E7800 Pure hypercholesterolemia, unspecified: Secondary | ICD-10-CM

## 2022-07-10 DIAGNOSIS — D696 Thrombocytopenia, unspecified: Secondary | ICD-10-CM

## 2022-07-10 NOTE — Telephone Encounter (Signed)
-----   Message from Velna Hatchet, RT sent at 06/28/2022 11:32 AM EST ----- Regarding: Mon 1/8 lab Patient is scheduled for cpx, please order future labs.  Thanks, Anda Kraft

## 2022-07-11 ENCOUNTER — Other Ambulatory Visit (INDEPENDENT_AMBULATORY_CARE_PROVIDER_SITE_OTHER): Payer: Commercial Managed Care - PPO

## 2022-07-11 DIAGNOSIS — I10 Essential (primary) hypertension: Secondary | ICD-10-CM

## 2022-07-11 DIAGNOSIS — E7800 Pure hypercholesterolemia, unspecified: Secondary | ICD-10-CM

## 2022-07-11 DIAGNOSIS — E559 Vitamin D deficiency, unspecified: Secondary | ICD-10-CM | POA: Diagnosis not present

## 2022-07-11 DIAGNOSIS — D696 Thrombocytopenia, unspecified: Secondary | ICD-10-CM | POA: Diagnosis not present

## 2022-07-11 DIAGNOSIS — R7303 Prediabetes: Secondary | ICD-10-CM | POA: Diagnosis not present

## 2022-07-11 LAB — CBC WITH DIFFERENTIAL/PLATELET
Basophils Absolute: 0 10*3/uL (ref 0.0–0.1)
Basophils Relative: 1.1 % (ref 0.0–3.0)
Eosinophils Absolute: 0.2 10*3/uL (ref 0.0–0.7)
Eosinophils Relative: 5.3 % — ABNORMAL HIGH (ref 0.0–5.0)
HCT: 39.3 % (ref 36.0–46.0)
Hemoglobin: 13.1 g/dL (ref 12.0–15.0)
Lymphocytes Relative: 45.6 % (ref 12.0–46.0)
Lymphs Abs: 2 10*3/uL (ref 0.7–4.0)
MCHC: 33.3 g/dL (ref 30.0–36.0)
MCV: 89.4 fl (ref 78.0–100.0)
Monocytes Absolute: 0.5 10*3/uL (ref 0.1–1.0)
Monocytes Relative: 11.6 % (ref 3.0–12.0)
Neutro Abs: 1.6 10*3/uL (ref 1.4–7.7)
Neutrophils Relative %: 36.4 % — ABNORMAL LOW (ref 43.0–77.0)
Platelets: 93 10*3/uL — ABNORMAL LOW (ref 150.0–400.0)
RBC: 4.4 Mil/uL (ref 3.87–5.11)
RDW: 14.1 % (ref 11.5–15.5)
WBC: 4.4 10*3/uL (ref 4.0–10.5)

## 2022-07-11 LAB — COMPREHENSIVE METABOLIC PANEL
ALT: 27 U/L (ref 0–35)
AST: 23 U/L (ref 0–37)
Albumin: 4.1 g/dL (ref 3.5–5.2)
Alkaline Phosphatase: 88 U/L (ref 39–117)
BUN: 16 mg/dL (ref 6–23)
CO2: 34 mEq/L — ABNORMAL HIGH (ref 19–32)
Calcium: 9.6 mg/dL (ref 8.4–10.5)
Chloride: 102 mEq/L (ref 96–112)
Creatinine, Ser: 0.58 mg/dL (ref 0.40–1.20)
GFR: 98.75 mL/min (ref >60.00)
Glucose, Bld: 103 mg/dL — ABNORMAL HIGH (ref 70–99)
Potassium: 4.2 mEq/L (ref 3.5–5.1)
Sodium: 142 mEq/L (ref 135–145)
Total Bilirubin: 0.4 mg/dL (ref 0.2–1.2)
Total Protein: 6.6 g/dL (ref 6.0–8.3)

## 2022-07-11 LAB — LIPID PANEL
Cholesterol: 142 mg/dL (ref 0–200)
HDL: 64.1 mg/dL (ref >39.00)
LDL Cholesterol: 71 mg/dL (ref 0–99)
NonHDL: 77.73
Total CHOL/HDL Ratio: 2
Triglycerides: 32 mg/dL (ref 0.0–149.0)
VLDL: 6.4 mg/dL (ref 0.0–40.0)

## 2022-07-11 LAB — HEMOGLOBIN A1C: Hgb A1c MFr Bld: 5.8 % (ref 4.6–6.5)

## 2022-07-11 LAB — VITAMIN D 25 HYDROXY (VIT D DEFICIENCY, FRACTURES): VITD: 42.04 ng/mL (ref 30.00–100.00)

## 2022-07-11 LAB — TSH: TSH: 0.61 u[IU]/mL (ref 0.35–5.50)

## 2022-07-18 ENCOUNTER — Ambulatory Visit (INDEPENDENT_AMBULATORY_CARE_PROVIDER_SITE_OTHER): Payer: Commercial Managed Care - PPO | Admitting: Family Medicine

## 2022-07-18 ENCOUNTER — Encounter (INDEPENDENT_AMBULATORY_CARE_PROVIDER_SITE_OTHER): Payer: Self-pay

## 2022-07-18 ENCOUNTER — Ambulatory Visit (INDEPENDENT_AMBULATORY_CARE_PROVIDER_SITE_OTHER): Payer: Commercial Managed Care - PPO | Admitting: Physician Assistant

## 2022-07-18 ENCOUNTER — Encounter: Payer: Self-pay | Admitting: Family Medicine

## 2022-07-18 ENCOUNTER — Other Ambulatory Visit (HOSPITAL_COMMUNITY): Payer: Self-pay

## 2022-07-18 VITALS — BP 122/76 | HR 70 | Temp 97.4°F | Ht 60.5 in | Wt 156.5 lb

## 2022-07-18 DIAGNOSIS — K760 Fatty (change of) liver, not elsewhere classified: Secondary | ICD-10-CM | POA: Diagnosis not present

## 2022-07-18 DIAGNOSIS — D696 Thrombocytopenia, unspecified: Secondary | ICD-10-CM | POA: Diagnosis not present

## 2022-07-18 DIAGNOSIS — Z6830 Body mass index (BMI) 30.0-30.9, adult: Secondary | ICD-10-CM | POA: Diagnosis not present

## 2022-07-18 DIAGNOSIS — Z0000 Encounter for general adult medical examination without abnormal findings: Secondary | ICD-10-CM

## 2022-07-18 DIAGNOSIS — Z23 Encounter for immunization: Secondary | ICD-10-CM | POA: Diagnosis not present

## 2022-07-18 DIAGNOSIS — I10 Essential (primary) hypertension: Secondary | ICD-10-CM

## 2022-07-18 DIAGNOSIS — G894 Chronic pain syndrome: Secondary | ICD-10-CM | POA: Diagnosis not present

## 2022-07-18 DIAGNOSIS — E559 Vitamin D deficiency, unspecified: Secondary | ICD-10-CM

## 2022-07-18 DIAGNOSIS — E7800 Pure hypercholesterolemia, unspecified: Secondary | ICD-10-CM | POA: Diagnosis not present

## 2022-07-18 DIAGNOSIS — R7303 Prediabetes: Secondary | ICD-10-CM | POA: Diagnosis not present

## 2022-07-18 MED ORDER — ROSUVASTATIN CALCIUM 10 MG PO TABS
10.0000 mg | ORAL_TABLET | Freq: Every day | ORAL | 3 refills | Status: DC
  Filled 2022-07-18 – 2022-07-23 (×2): qty 90, fill #0
  Filled 2022-09-19: qty 90, 90d supply, fill #0
  Filled 2022-09-26 – 2022-12-15 (×2): qty 90, 90d supply, fill #1
  Filled 2023-03-13: qty 90, 90d supply, fill #2
  Filled 2023-06-10 – 2023-06-12 (×2): qty 90, 90d supply, fill #3

## 2022-07-18 MED ORDER — LOSARTAN POTASSIUM-HCTZ 100-25 MG PO TABS
1.0000 | ORAL_TABLET | Freq: Every day | ORAL | 3 refills | Status: DC
  Filled 2022-07-18 – 2022-07-23 (×2): qty 90, 90d supply, fill #0
  Filled 2022-11-19: qty 90, 90d supply, fill #1
  Filled 2023-02-13: qty 90, 90d supply, fill #2
  Filled 2023-05-17 (×2): qty 90, 90d supply, fill #3

## 2022-07-18 NOTE — Assessment & Plan Note (Signed)
Platelet ct of 93  No bleeding or bruising  Followed by hematology

## 2022-07-18 NOTE — Assessment & Plan Note (Signed)
Normal liver labs today  Reassuring  Commended on better diet and wt loss Will continue to monitor

## 2022-07-18 NOTE — Assessment & Plan Note (Signed)
Disc goals for lipids and reasons to control them Rev last labs with pt Rev low sat fat diet in detail  LDL of 71 Doing well with crestor 10 mg daily and good diet

## 2022-07-18 NOTE — Progress Notes (Signed)
Subjective:    Patient ID: Katherine Tanner, female    DOB: 1961-04-20, 62 y.o.   MRN: [id number]  HPI Here for health maintenance exam and to review chronic medical problems    Wt Readings from Last 3 Encounters:  07/18/22 156 lb 8 oz (71 kg)  06/06/22 152 lb (68.9 kg)  05/20/22 160 lb 6.4 oz (72.8 kg)   30.06 kg/m  [facility]   Had more phlegm last week Pnd - made her nauseated   Taking tramadol for pain  Went up on the dose- felt sick and may not have tolerated well  Goes to the pain clinic     Immunization History  Administered Date(s) Administered   PFIZER(Purple Top)SARS-COV-2 Vaccination 01/10/2020, 01/31/2020, 07/18/2020   Pneumococcal Polysaccharide-23 12/09/2011   Td 01/02/2001   Tdap 12/09/2011   Health Maintenance Due  Topic Date Due   HIV Screening  Never done   Hepatitis C Screening  Never done   Zoster Vaccines- Shingrix (1 of 2) Never done   DTaP/Tdap/Td (3 - Td or Tdap) 12/08/2021   COVID-19 Vaccine (4 - 2023-24 season) 03/04/2022   Shingrix vaccine : declines   Tdap 12/2011 - due for Td today   Allergic to flu shot  Mammogram 05/2022  Self breast exam: no lumps or changes   Pap 05/2022- has calcifications and f/u will be 6 months  Nl with neg HPV   Colonoscopy 10/2019  10 y recall   HTN bp is stable today  No cp or palpitations or headaches or edema  No side effects to medicines  BP Readings from Last 3 Encounters:  07/18/22 122/76  06/06/22 (!) 144/89  05/20/22 121/72    Losartan hct 100-25 mg 1/2 pill daily   Tx for OSA  H/o non alcoholic hepatosteatosis Lab Results  Component Value Date   ALT 27 07/11/2022   AST 23 07/11/2022   ALKPHOS 88 07/11/2022   BILITOT 0.4 07/11/2022     H/o low platelet ct Sees hematology Lab Results  Component Value Date   WBC 4.4 07/11/2022   HGB 13.1 07/11/2022   HCT 39.3 07/11/2022   MCV 89.4 07/11/2022   PLT 93.0 (L) 07/11/2022   Watching it  No bleeding or bruising     H/o low D Level of 42  Prediabetes Lab Results  Component Value Date   HGBA1C 5.8 07/11/2022   Stable from a year ago  Takes metformin 500 mg bid   Hyperlipidemia Lab Results  Component Value Date   CHOL 142 07/11/2022   CHOL 146 07/09/2021   CHOL 159 03/22/2021   Lab Results  Component Value Date   HDL 64.10 07/11/2022   HDL 61.70 07/09/2021   HDL 68 03/22/2021   Lab Results  Component Value Date   LDLCALC 71 07/11/2022   LDLCALC 77 07/09/2021   LDLCALC 81 03/22/2021   Lab Results  Component Value Date   TRIG 32.0 07/11/2022   TRIG 35.0 07/09/2021   TRIG 45 03/22/2021   Lab Results  Component Value Date   CHOLHDL 2 07/11/2022   CHOLHDL 2 07/09/2021   CHOLHDL 2.3 03/22/2021   Lab Results  Component Value Date   LDLDIRECT 147.9 12/17/2012   LDLDIRECT 138.3 12/09/2011   Crestor 10 mg daily   Long term better habits for eating now  Has kept her weight off   If sweets just a bite and she is done   Exercise  Bike at least 30 min 5 d per week Doing her PT stretches  Weights twice per week   Wants to join a gym if she can find one she likes   Patient Active Problem List   Diagnosis Date Noted   Chronic pain syndrome 02/09/2022   Left ankle pain 01/12/2021   Overweight with body mass index (BMI) of 29 to 29.9 in adult 08/17/2020   At risk for diabetes mellitus 19/14/7829   Nonalcoholic hepatosteatosis 56/21/3086   At risk for heart disease 07/06/2020   Transaminitis 04/07/2020   Thrombocytopenia ([place]) 07/08/2019   Insulin resistance 06/19/2019   Vitamin D deficiency 11/05/2018   Obstructive sleep apnea 10/10/2017   Primary osteoarthritis of left knee 12/30/2016   BMI 30.0-30.9,adult 05/24/2016   Prediabetes 02/24/2014   Osteoarthritis of right knee 11/15/2013   Osteoarthritis of left knee 11/15/2013   Alkaline phosphatase elevation 01/02/2012   Hyperlipidemia 01/02/2012   Routine general medical  examination at a health care facility 12/09/2011   Allergy to influenza vaccine 05/02/2011   POLYARTHRITIS 01/28/2010   Chronic pain of both knees 12/11/2009   ANGIOEDEMA 03/27/2007   Essential hypertension 03/19/2007   Perennial allergic rhinitis with seasonal variation 03/19/2007   Asthma, moderate persistent 03/19/2007   GERD 03/19/2007   HIATAL HERNIA 03/19/2007   GESTATIONAL DIABETES 03/19/2007   Past Medical History:  Diagnosis Date   Allergy    takes Claritin daily and Flonase daily   Arthritis    Asthma    uses Albuterol daily as needed;Singulair nightly;DUlera daily   Back pain    Constipation    Fatty liver    Food allergy    GERD (gastroesophageal reflux disease)    occasionally will take OTC meds but states she just watches what she eats   H/O hiatal hernia    History of bronchitis    last time 40yr ago   History of colon polyps    History of kidney stones    History of shingles    Hyperlipidemia    Hypertension    takes Hyzaar daily   Joint pain    Joint pain    Joint swelling    Obesity    Osteoarthritis    Pneumonia    hx of;last time at least 241yrago   PONV (postoperative nausea and vomiting)    Prediabetes    Shortness of breath    Sleep apnea    Swelling of lower extremity    Trouble in sleeping    Wheezing    Past Surgical History:  Procedure Laterality Date   BREAST EXCISIONAL BIOPSY Right    CARDIAC CATHETERIZATION     COLONOSCOPY     ESOPHAGOGASTRODUODENOSCOPY     KNEE SURGERY Right    arthroscopy   Right ganglion cyst     x 2   right lumpectomy  around [place] 11/15/2013   Procedure: Marcaine/STEROID INJECTION;  Surgeon: JoAlta CorningMD;  [facility];  Laterality: Left;   TOTAL KNEE ARTHROPLASTY Right 11/15/2013   Procedure: RIGHT TOTAL KNEE ARTHROPLASTY;  Surgeon: JoAlta CorningMD;  [facility];  Laterality: Right;   TOTAL KNEE ARTHROPLASTY Left  12/30/2016   Procedure: TOTAL KNEE ARTHROPLASTY;  Surgeon: GrDorna LeitzMD;  [place] [facility];  Laterality: Left;   TUBAL LIGATION     Social History   Tobacco Use   Smoking status: Never   Smokeless  tobacco: Never  Vaping Use   Vaping Use: Never used  Substance Use Topics   Alcohol use: No    Alcohol/week: 0.0 standard drinks of alcohol   Drug use: No   Family History  Problem Relation Age of Onset   Diabetes Father    Cancer Father    Liver disease Father    Alcoholism Father    Sarcoidosis Mother    Glaucoma Mother    Hypertension Mother    Hyperlipidemia Mother    Sleep apnea Mother    Obesity Mother    Breast cancer Neg Hx    Allergies  Allergen Reactions   Ace Inhibitors Shortness Of Breath and Cough    Cough/ breathing problems    Amoxicillin-Pot Clavulanate Swelling    SWELLING REACTION UNSPECIFIED    Influenza Virus Vacc Split Pf     UNSPECIFIED REACTION    Shellfish Allergy Itching   Current Outpatient Medications on File Prior to Visit  Medication Sig Dispense Refill   albuterol (VENTOLIN HFA) 108 (90 Base) MCG/ACT inhaler Inhale 2 puffs into the lungs every 6 (six) hours as needed for wheezing or shortness of breath 6.7 g 12   baclofen (LIORESAL) 10 MG tablet Take 1 tablet (10 mg total) by mouth 2 (two) times daily as needed. 60 tablet 3   Calcium Citrate-Vitamin D (CALCIUM + D PO) Take 1 tablet by mouth daily.     Cholecalciferol (VITAMIN D3) 25 MCG (1000 UT) CAPS Take 1 capsule (1,000 Units total) by mouth daily. 30 capsule 0   diclofenac Sodium (VOLTAREN) 1 % GEL Apply 4 grams to skin four times a day 960 g 5   DULoxetine (CYMBALTA) 60 MG capsule Take 1 capsule (60 mg total) by mouth at bedtime. 30 capsule 1   EPINEPHRINE 0.3 mg/0.3 mL IJ SOAJ injection INJECT 1 SYRINGE INTO THE MUSCLE ONCE 1 each 1   fluticasone-salmeterol (ADVAIR DISKUS) 250-50 MCG/ACT AEPB Inhale 1 puff into the lungs 2 times daily then rinse mouth 60 each 10    loratadine (CLARITIN) 10 MG tablet Take 10 mg by mouth daily.     meloxicam (MOBIC) 15 MG tablet Take 1 tablet by mouth once a day 30 tablet 1   metFORMIN (GLUCOPHAGE) 500 MG tablet Take 1 tablet by mouth 2 times daily (with breakfast and with lunch.) 180 tablet 0   montelukast (SINGULAIR) 10 MG tablet TAKE 1 TABLET BY MOUTH AT BEDTIME 90 tablet 3   Multiple Vitamin (MULTIVITAMIN PO) Take 1 tablet by mouth daily.     Nebulizers (COMPRESSOR/NEBULIZER) MISC Use as directed 1 each 0   traMADol (ULTRAM) 50 MG tablet Take 2 tablet by mouth four times a day as needed for pain 240 tablet 1   No current facility-administered medications on file prior to visit.     Review of Systems  Constitutional:  Negative for activity change, appetite change, fatigue, fever and unexpected weight change.  HENT:  Negative for congestion, ear pain, rhinorrhea, sinus pressure and sore throat.   Eyes:  Negative for pain, redness and visual disturbance.  Respiratory:  Negative for cough, shortness of breath and wheezing.   Cardiovascular:  Negative for chest pain and palpitations.  Gastrointestinal:  Negative for abdominal pain, blood in stool, constipation and diarrhea.  Endocrine: Negative for polydipsia and polyuria.  Genitourinary:  Negative for dysuria, frequency and urgency.  Musculoskeletal:  Positive for arthralgias. Negative for back pain and myalgias.  Skin:  Negative for pallor and rash.  Allergic/Immunologic: Negative  for environmental allergies.  Neurological:  Negative for dizziness, syncope and headaches.  Hematological:  Negative for adenopathy. Does not bruise/bleed easily.  Psychiatric/Behavioral:  Negative for decreased concentration and dysphoric mood. The patient is not nervous/anxious.        Objective:   Physical Exam Constitutional:      General: She is not in acute distress.    Appearance: Normal appearance. She is well-developed. She is obese. She is not ill-appearing or diaphoretic.   HENT:     Head: Normocephalic and atraumatic.     Right Ear: Tympanic membrane, ear canal and external ear normal.     Left Ear: Tympanic membrane, ear canal and external ear normal.     Nose: Nose normal. No congestion.     Mouth/Throat:     Mouth: Mucous membranes are moist.     Pharynx: Oropharynx is clear. No posterior oropharyngeal erythema.  Eyes:     General: No scleral icterus.    Extraocular Movements: Extraocular movements intact.     Conjunctiva/sclera: Conjunctivae normal.     Pupils: Pupils are equal, round, and reactive to light.  Neck:     Thyroid: No thyromegaly.     Vascular: No carotid bruit or JVD.  Cardiovascular:     Rate and Rhythm: Normal rate and regular rhythm.     Pulses: Normal pulses.     Heart sounds: Normal heart sounds.     No gallop.  Pulmonary:     Effort: Pulmonary effort is normal. No respiratory distress.     Breath sounds: Normal breath sounds. No wheezing.     Comments: Good air exch Chest:     Chest wall: No tenderness.  Abdominal:     General: Bowel sounds are normal. There is no distension or abdominal bruit.     Palpations: Abdomen is soft. There is no mass.     Tenderness: There is no abdominal tenderness.     Hernia: No hernia is present.  Genitourinary:    Comments: Routine breast exam done by another provider  Musculoskeletal:        General: No tenderness. Normal range of motion.     Cervical back: Normal range of motion and neck supple. No rigidity. No muscular tenderness.     Right lower leg: No edema.     Left lower leg: No edema.     Comments: No kyphosis   Lymphadenopathy:     Cervical: No cervical adenopathy.  Skin:    General: Skin is warm and dry.     Coloration: Skin is not pale.     Findings: No erythema or rash.  Neurological:     Mental Status: She is alert. Mental status is at baseline.     Cranial Nerves: No cranial nerve deficit.     Motor: No abnormal muscle tone.     Coordination: Coordination normal.      Gait: Gait normal.     Deep Tendon Reflexes: Reflexes are normal and symmetric.  Psychiatric:        Mood and Affect: Mood normal.        Cognition and Memory: Cognition and memory normal.           Assessment & Plan:   Problem List Items Addressed This Visit       Cardiovascular and Mediastinum   Essential hypertension    bp in fair control at this time  BP Readings from Last 1 Encounters:  07/18/22 122/76  No changes needed Most recent  labs reviewed  Disc lifstyle change with low sodium diet and exercise  Plan to continue losartan hct 100-25 mg 1/2 pill daily      Relevant Medications   losartan-hydrochlorothiazide (HYZAAR) 100-25 MG tablet   rosuvastatin (CRESTOR) 10 MG tablet     Digestive   Nonalcoholic hepatosteatosis    Normal liver labs today  Reassuring  Commended on better diet and wt loss Will continue to monitor         Hematopoietic and Hemostatic   Thrombocytopenia ([facility])    Platelet ct of 93  No bleeding or bruising  Followed by hematology        Other   BMI 30.0-30.9,adult    Bmi down to 30.0  Commended In healthy weight clinic   Enc her to keep up the great work with diet/exercise  Enc more strength training       Chronic pain syndrome    Pt continues care with pain specialist  Recent inc in tramadol caused nausea- she has started cutting it back      Hyperlipidemia    Disc goals for lipids and reasons to control them Rev last labs with pt Rev low sat fat diet in detail  LDL of 71 Doing well with crestor 10 mg daily and good diet        Relevant Medications   losartan-hydrochlorothiazide (HYZAAR) 100-25 MG tablet   rosuvastatin (CRESTOR) 10 MG tablet   Prediabetes    Stable with metformin 500 mg bid and good habits disc imp of low glycemic diet and wt loss to prevent DM2  Under care of [facility]      Routine general medical examination at a health care facility - Primary    Reviewed  health habits including diet and exercise and skin cancer prevention Reviewed appropriate screening tests for age  Also reviewed health mt list, fam hx and immunization status , as well as social and family history   See HPI Labs reviewed Declines shingrix vaccine -may re consider in the future Td updated today  Allergic to flu shot Mammogram utd 05/2022 with 6 mo f/u for calcifications Has breast exams outside office Colonoscopy utd 10/2019 with 10 y recall Enc her to keep up great diet/exercise and wt loss       Vitamin D deficiency    Vitamin D level is therapeutic with current supplementation Disc importance of this to bone and overall health Level of 42 End her to continue 1000 iu D3 daily        Other Visit Diagnoses     Need for Td vaccine       Relevant Orders   Td : Tetanus/diphtheria >7yo Preservative  free (Completed)

## 2022-07-18 NOTE — Assessment & Plan Note (Signed)
Stable with metformin 500 mg bid and good habits disc imp of low glycemic diet and wt loss to prevent DM2  Under care of the [facility]

## 2022-07-18 NOTE — Assessment & Plan Note (Signed)
Vitamin D level is therapeutic with current supplementation Disc importance of this to bone and overall health Level of 42 End her to continue 1000 iu D3 daily

## 2022-07-18 NOTE — Assessment & Plan Note (Signed)
Bmi down to 30.0  Commended In healthy weight clinic   Enc her to keep up the great work with diet/exercise  Enc more strength training

## 2022-07-18 NOTE — Assessment & Plan Note (Addendum)
Reviewed health habits including diet and exercise and skin cancer prevention Reviewed appropriate screening tests for age  Also reviewed health mt list, fam hx and immunization status , as well as social and family history   See HPI Labs reviewed Declines shingrix vaccine -may re consider in the future Td updated today  Allergic to flu shot Mammogram utd 05/2022 with 6 mo f/u for calcifications Has breast exams outside office Colonoscopy utd 10/2019 with 10 y recall Enc her to keep up great diet/exercise and wt loss

## 2022-07-18 NOTE — Assessment & Plan Note (Signed)
Pt continues care with pain specialist  Recent inc in tramadol caused nausea- she has started cutting it back

## 2022-07-18 NOTE — Assessment & Plan Note (Signed)
bp in fair control at this time  BP Readings from Last 1 Encounters:  07/18/22 122/76   No changes needed Most recent labs reviewed  Disc lifstyle change with low sodium diet and exercise  Plan to continue losartan hct 100-25 mg 1/2 pill daily

## 2022-07-18 NOTE — Patient Instructions (Addendum)
If you are interested in the new shingles vaccine (Shingrix) - call your local pharmacy to check on coverage and availability  If affordable, get on a wait list at your pharmacy to get the vaccine.   Tetanus shot (Td) today   Take care of yourself   Keep up the great job with diet and exercise   Labs look stable   No change in medicines   You may need to cut the tramadol dose if it is making you nauseated

## 2022-07-23 ENCOUNTER — Other Ambulatory Visit: Payer: Self-pay | Admitting: Internal Medicine

## 2022-07-24 NOTE — Progress Notes (Signed)
Patient ID: Katherine Tanner, female    DOB: [birth year], 62 y.o.   MRN: [id number]  HPI female never smoker followed for Allergic rhinitis, Asthma,OSA, complicated by GERD Office spirometry: 11/24/2014: WNL FVC 2.39/97%, FEV1 2.09/103%, FEV1/FVC 0.88, FEF 25-75 percent 3.27/122% Office Spirometry 07/06/17-WNL-FVC 2.25/91%, FEV1 2.04/104%, ratio 0.91, FEF 25-75% 3.62/173% FENO 10/09/17   11   Not elevated Methacholine inhalation challenge test 01/25/2018-interpreted as positive because of initiation of hard coughing although FEV1 only declined by 15%. HST 10/27/2017-AHI 7.3/hour, desaturation to 88%, body weight 193 pounds PFT 07/26/21- Normal flows and volumes, DLCO relatively high for volume ----------------------------------------------------------------------------------   07/26/21- 62 year old female never smoker followed for Allergic rhinitis, Asthma,  Hx OSA(minimal- no CPAP/ lost weight), complicated by GERD, HTN, Thrombocytopenia, PreDiabetes,  -Neb Duoneb, Singulair, Advair 250, Flonase, albuterol hfa,  Epipen for Shellfish allergy  Covid vax-3 Phizer Flu vax-declined "allergic" Body weight today -156 lbs ACT score 21- She notes some shortness of breath mainly with weather changes and around strong odors.  Generally feels well controlled with current meds.  Breztri made her feel "funny" so she went back to Advair. PFT 07/26/21- Normal flows and volumes, DLCO relatively high for volume CXR 05/26/21-  IMPRESSION: No active cardiopulmonary disease.  07/26/22- 62 year old female never smoker followed for Allergic rhinitis, Asthma,  Hx OSA(minimal- no CPAP/ lost weight), complicated by GERD, HTN, Thrombocytopenia, PreDiabetes,  -Neb Duoneb, Singulair, Advair 250, Flonase, albuterol hfa,  Epipen for Shellfish allergy  Covid vax-3 Phizer Flu vax-declined "allergic" Body weight today -159 lbs Notes more chest mucus on first waking. No infection or significant asthma. Walking into  work in cold air may cause some wheeze- uses rescue inhaler then. Dry nose nosebleeds some if platelets low. Has airpurifier and humidifier.  Review of Systems-Per HPI.   + = positive Constitutional:   +diet>   weight loss, night sweats, fevers, chills, fatigue, lassitude. HEENT:   +  headaches, difficulty swallowing, tooth/dental problems, sore throat,       No-sneezing, itching, no-ear ache, + nasal congestion, no- post nasal drip,  CV:   chest pain, orthopnea, PND, swelling in lower extremities, anasarca, dizziness, palpitations Resp: +shortness of breath with exertion or at rest.             productive cough,  No- non-productive cough,  No- coughing up of blood.              No-   change in color of mucus. No- wheezing.   Skin:  GI:  No-   heartburn, indigestion, abdominal pain, nausea, vomiting,  GU: . MS:  No-   joint pain or swelling.  . Neuro-     nothing unusual Psych:  No- change in mood or affect. No depression or anxiety.  No memory loss.  Objective:   Physical Exam General- Alert, Oriented, Affect-appropriate, Distress- none acute;  Skin- rash-none, lesions- none, excoriation- none Lymphadenopathy- none Head- atraumatic            Eyes- Gross vision intact, PERRLA, conjunctivae clear, no discolored secretions            Ears- Hearing, canals-normal            Nose-    no-Septal dev, mucus, polyps, erosion, perforation             Throat- Mallampati III , mucosa clear , drainage- none, tonsils- atrophic Neck- flexible , trachea midline, no stridor , thyroid nl, carotid no bruit Chest - symmetrical excursion , unlabored  Heart/CV- RRR , no murmur , no gallop  , no rub, nl s1 s2                           - JVD- none , edema- none, stasis changes- none, varices- none           Lung- clear, unlabored, wheeze- none, cough- none , dullness-none, rub- none,            Chest wall-  Abd-  Br/ Gen/ Rectal- Not done, not indicated Extrem- cyanosis- none, clubbing, none,  atrophy- none, strength- nl Neuro- grossly intact to observation

## 2022-07-25 ENCOUNTER — Other Ambulatory Visit: Payer: Self-pay

## 2022-07-25 ENCOUNTER — Other Ambulatory Visit (HOSPITAL_COMMUNITY): Payer: Self-pay

## 2022-07-25 ENCOUNTER — Encounter (INDEPENDENT_AMBULATORY_CARE_PROVIDER_SITE_OTHER): Payer: Self-pay | Admitting: Physician Assistant

## 2022-07-25 ENCOUNTER — Ambulatory Visit (INDEPENDENT_AMBULATORY_CARE_PROVIDER_SITE_OTHER): Payer: Commercial Managed Care - PPO | Admitting: Physician Assistant

## 2022-07-25 VITALS — BP 149/80 | HR 79 | Temp 97.9°F | Ht 61.0 in

## 2022-07-25 DIAGNOSIS — Z6829 Body mass index (BMI) 29.0-29.9, adult: Secondary | ICD-10-CM

## 2022-07-25 DIAGNOSIS — R7303 Prediabetes: Secondary | ICD-10-CM | POA: Diagnosis not present

## 2022-07-25 DIAGNOSIS — E669 Obesity, unspecified: Secondary | ICD-10-CM | POA: Diagnosis not present

## 2022-07-25 DIAGNOSIS — D696 Thrombocytopenia, unspecified: Secondary | ICD-10-CM | POA: Diagnosis not present

## 2022-07-25 DIAGNOSIS — Z780 Asymptomatic menopausal state: Secondary | ICD-10-CM

## 2022-07-26 ENCOUNTER — Encounter: Payer: Self-pay | Admitting: Internal Medicine

## 2022-07-26 ENCOUNTER — Other Ambulatory Visit (HOSPITAL_COMMUNITY): Payer: Self-pay

## 2022-07-26 ENCOUNTER — Ambulatory Visit: Payer: 59 | Admitting: Internal Medicine

## 2022-07-26 VITALS — BP 112/64 | HR 94 | Ht 61.0 in | Wt 159.4 lb

## 2022-07-26 DIAGNOSIS — J302 Other seasonal allergic rhinitis: Secondary | ICD-10-CM

## 2022-07-26 DIAGNOSIS — J4540 Moderate persistent asthma, uncomplicated: Secondary | ICD-10-CM

## 2022-07-26 DIAGNOSIS — J3089 Other allergic rhinitis: Secondary | ICD-10-CM

## 2022-07-26 MED ORDER — MONTELUKAST SODIUM 10 MG PO TABS
10.0000 mg | ORAL_TABLET | Freq: Every day | ORAL | 11 refills | Status: DC
  Filled 2022-07-26 – 2022-10-22 (×5): qty 30, 30d supply, fill #0
  Filled 2022-11-19: qty 30, 30d supply, fill #1
  Filled 2022-12-23: qty 30, 30d supply, fill #2
  Filled 2023-01-23: qty 30, 30d supply, fill #3
  Filled 2023-02-13 – 2023-02-16 (×4): qty 30, 30d supply, fill #4
  Filled 2023-03-20: qty 30, 30d supply, fill #5
  Filled 2023-04-18: qty 30, 30d supply, fill #6
  Filled 2023-05-15: qty 30, 30d supply, fill #7
  Filled 2023-06-13 (×3): qty 30, 30d supply, fill #8
  Filled 2023-07-09: qty 30, 30d supply, fill #9

## 2022-07-26 MED ORDER — FLUTICASONE-SALMETEROL 250-50 MCG/ACT IN AEPB
INHALATION_SPRAY | RESPIRATORY_TRACT | 10 refills | Status: DC
  Filled 2022-07-26: qty 60, 30d supply, fill #0
  Filled 2022-08-20: qty 60, 30d supply, fill #1
  Filled 2022-09-23: qty 60, 30d supply, fill #2
  Filled 2022-10-22: qty 60, 30d supply, fill #3
  Filled 2022-11-24: qty 60, 30d supply, fill #4
  Filled 2022-12-26: qty 60, 30d supply, fill #5
  Filled 2023-01-24: qty 60, 30d supply, fill #6
  Filled 2023-02-18: qty 60, 30d supply, fill #7
  Filled 2023-03-20: qty 60, 30d supply, fill #8
  Filled 2023-04-18: qty 60, 30d supply, fill #9
  Filled 2023-05-16: qty 60, 30d supply, fill #10

## 2022-07-26 NOTE — Assessment & Plan Note (Signed)
Suggested saline nasal gel if dry nose triggers nosebleeds.

## 2022-07-26 NOTE — Assessment & Plan Note (Signed)
Doing ok with Advair, singulair and rescue Plan- refill Advair and Singulair today

## 2022-07-26 NOTE — Patient Instructions (Signed)
We are refilling Advair and Singulair  Ok to continue routine meds for now. Let us know if you have concerns.  Try otc nasal saline gel (store-brand, NeilMed, AYR,etc) as needed for dry nose nose bleeds

## 2022-07-27 ENCOUNTER — Other Ambulatory Visit (HOSPITAL_COMMUNITY): Payer: Self-pay

## 2022-08-02 NOTE — Progress Notes (Unsigned)
Chief Complaint:   OBESITY Katherine Tanner is here to discuss her progress with her obesity treatment plan along with follow-up of her obesity related diagnoses. Katherine Tanner is on the Category 2 Plan and states she is following her eating plan approximately 100% of the time. Katherine Tanner states she is bike/weights 30 minutes 5 times per week.  Today's visit was #: 47 Starting weight: 198 lbs Starting date: 08/30/2018 Today's weight: 155 lbs Today's date: 07/25/2022 Total lbs lost to date: 43 lbs Total lbs lost since last in-office visit: 0  Interim History: Katherine Tanner has done very well with weight loss.  She reports she did well over the holidays and was mindful. Bioimpedance scale shows muscle mass is up 0.2 pounds     Adipose is up 2 pounds      Water is up 0.6 pounds She is on maintenance with healthy weight and wellness. She saw her PCP on 07/11/2022 and some of her labs were done at this time, but no fasting insulin.  Subjective:   1. Prediabetes A1c at 5.8 on 07/12/19-stable/sightly increased-on Metformin 500 mg-Denies any side effects.  No refill needed today.  No recent insulin level.  2. Thrombocytopenia ([place]) Chronic on 07/11/22.  [id number]-stable.  Sees [place] hematology-no complications.  Felt to be autoimmune.  3. Menopause Reports high sweats/difficulty with sleeping, saw GYN in Nov 23.  Tried some over the counter supplements without success.  Assessment/Plan:   1. Prediabetes Continue Metformin,Continue Prescribed Nutrition Plan and exercise to promote weight loss/improved glycemic control/prevent progression to type 2 diabetes.  Check fasting insulin lab at next visit.   2. Thrombocytopenia ([place]) Seeing hematology at [facility] regional every 2-3 months.  Will follow  3. Menopause Advised to talk with GYN about new medication, Veozah, for hot flashes. We also discussed the impact of regular exercise, weight management on controlling menopausal symptoms.  4. Obesity, Current BMI  29.3 Katherine Tanner is currently in the action stage of change. As such, her goal is to continue with weight loss efforts. She has agreed to the Category 2 Plan.   Exercise goals: As is.  Adding Kettle ball strengthening exercise with light 5lb bell.  Behavioral modification strategies: increasing lean protein intake, decreasing simple carbohydrates, and planning for success.  Katherine Tanner has agreed to follow-up with our clinic in 7 weeks. She was informed of the importance of frequent follow-up visits to maximize her success with intensive lifestyle modifications for her multiple health conditions.   Objective:   Blood pressure (!) 149/80, pulse 79, temperature 97.9 F (36.6 C), height '5\' 1"'$  (1.549 m), last menstrual period 02/20/2014, SpO2 98 %. Body mass index is 29.57 kg/m.  General: Cooperative, alert, well developed, in no acute distress. HEENT: Conjunctivae and lids unremarkable. Cardiovascular: Regular rhythm.  Lungs: Normal work of breathing. Neurologic: No focal deficits.   Lab Results  Component Value Date   CREATININE 0.58 07/11/2022   BUN 16 07/11/2022   NA 142 07/11/2022   K 4.2 07/11/2022   CL 102 07/11/2022   CO2 34 (H) 07/11/2022   Lab Results  Component Value Date   ALT 27 07/11/2022   AST 23 07/11/2022   ALKPHOS 88 07/11/2022   BILITOT 0.4 07/11/2022   Lab Results  Component Value Date   HGBA1C 5.8 07/11/2022   HGBA1C 5.6 03/28/2022   HGBA1C 5.6 12/20/2021   HGBA1C 5.6 09/28/2021   HGBA1C 5.8 07/09/2021   Lab Results  Component Value Date   INSULIN 6.7 03/28/2022   INSULIN 10.0  12/20/2021   INSULIN 3.7 09/28/2021   INSULIN 7.0 06/24/2021   INSULIN 6.9 03/22/2021   Lab Results  Component Value Date   TSH 0.61 07/11/2022   Lab Results  Component Value Date   CHOL 142 07/11/2022   HDL 64.10 07/11/2022   LDLCALC 71 07/11/2022   LDLDIRECT 147.9 12/17/2012   TRIG 32.0 07/11/2022   CHOLHDL 2 07/11/2022   Lab Results  Component Value Date   VD25OH  42.04 07/11/2022   VD25OH 45.1 03/28/2022   VD25OH 67.1 12/20/2021   Lab Results  Component Value Date   WBC 4.4 07/11/2022   HGB 13.1 07/11/2022   HCT 39.3 07/11/2022   MCV 89.4 07/11/2022   PLT 93.0 (L) 07/11/2022   Lab Results  Component Value Date   IRON 63 07/07/2020   Attestation Statements:   Reviewed by clinician on day of visit: allergies, medications, problem list, medical history, surgical history, family history, social history, and previous encounter notes.  I, Brendell Tyus, am acting as transcriptionist for [employer], PA.  I have reviewed the above documentation for accuracy and completeness, and I agree with the above. -  Kailoni Vahle,PA-C

## 2022-08-20 ENCOUNTER — Other Ambulatory Visit (HOSPITAL_COMMUNITY): Payer: Self-pay

## 2022-08-22 ENCOUNTER — Other Ambulatory Visit (HOSPITAL_COMMUNITY): Payer: Self-pay

## 2022-08-22 ENCOUNTER — Inpatient Hospital Stay: Payer: Commercial Managed Care - PPO | Attending: Internal Medicine

## 2022-08-22 ENCOUNTER — Other Ambulatory Visit: Payer: Self-pay

## 2022-08-22 DIAGNOSIS — D696 Thrombocytopenia, unspecified: Secondary | ICD-10-CM | POA: Insufficient documentation

## 2022-08-22 LAB — CBC WITH DIFFERENTIAL/PLATELET
Abs Immature Granulocytes: 0.01 10*3/uL (ref 0.00–0.07)
Basophils Absolute: 0.1 10*3/uL (ref 0.0–0.1)
Basophils Relative: 1 %
Eosinophils Absolute: 0.1 10*3/uL (ref 0.0–0.5)
Eosinophils Relative: 2 %
HCT: 42.1 % (ref 36.0–46.0)
Hemoglobin: 13.7 g/dL (ref 12.0–15.0)
Immature Granulocytes: 0 %
Lymphocytes Relative: 33 %
Lymphs Abs: 2.1 10*3/uL (ref 0.7–4.0)
MCH: 29.1 pg (ref 26.0–34.0)
MCHC: 32.5 g/dL (ref 30.0–36.0)
MCV: 89.4 fL (ref 80.0–100.0)
Monocytes Absolute: 0.6 10*3/uL (ref 0.1–1.0)
Monocytes Relative: 9 %
Neutro Abs: 3.5 10*3/uL (ref 1.7–7.7)
Neutrophils Relative %: 55 %
Platelets: 91 10*3/uL — ABNORMAL LOW (ref 150–400)
RBC: 4.71 MIL/uL (ref 3.87–5.11)
RDW: 13.3 % (ref 11.5–15.5)
WBC: 6.4 10*3/uL (ref 4.0–10.5)
nRBC: 0 % (ref 0.0–0.2)

## 2022-08-23 ENCOUNTER — Other Ambulatory Visit (HOSPITAL_COMMUNITY): Payer: Self-pay

## 2022-08-23 ENCOUNTER — Other Ambulatory Visit: Payer: Self-pay

## 2022-09-02 ENCOUNTER — Other Ambulatory Visit (HOSPITAL_COMMUNITY): Payer: Self-pay

## 2022-09-02 ENCOUNTER — Encounter: Payer: Self-pay | Admitting: Internal Medicine

## 2022-09-02 MED ORDER — AZITHROMYCIN 250 MG PO TABS
250.0000 mg | ORAL_TABLET | Freq: Every day | ORAL | 0 refills | Status: DC
  Filled 2022-09-02: qty 6, 6d supply, fill #0

## 2022-09-02 MED ORDER — PREDNISONE 10 MG PO TABS
ORAL_TABLET | ORAL | 0 refills | Status: AC
  Filled 2022-09-02: qty 20, 8d supply, fill #0

## 2022-09-02 NOTE — Telephone Encounter (Signed)
Prednisone 10 mg, # 20, .4 X 2 DAYS, 3 X 2 DAYS, 2 X 2 DAYS, 1 X 2 DAYS  Zpak   250 mg, # 6, 2 today then one daily    if she thinks this may be infection. Spring tree pollen season has started, so this might also be allergy.

## 2022-09-05 ENCOUNTER — Other Ambulatory Visit (HOSPITAL_COMMUNITY): Payer: Self-pay

## 2022-09-05 DIAGNOSIS — M25569 Pain in unspecified knee: Secondary | ICD-10-CM | POA: Diagnosis not present

## 2022-09-05 DIAGNOSIS — G4700 Insomnia, unspecified: Secondary | ICD-10-CM | POA: Diagnosis not present

## 2022-09-05 DIAGNOSIS — Z79891 Long term (current) use of opiate analgesic: Secondary | ICD-10-CM | POA: Diagnosis not present

## 2022-09-05 DIAGNOSIS — M7552 Bursitis of left shoulder: Secondary | ICD-10-CM | POA: Diagnosis not present

## 2022-09-05 DIAGNOSIS — G894 Chronic pain syndrome: Secondary | ICD-10-CM | POA: Diagnosis not present

## 2022-09-05 MED ORDER — MELOXICAM 15 MG PO TABS
15.0000 mg | ORAL_TABLET | Freq: Every day | ORAL | 1 refills | Status: DC | PRN
  Filled 2022-09-05: qty 30, 30d supply, fill #0
  Filled 2022-11-19: qty 30, 30d supply, fill #1

## 2022-09-05 MED ORDER — TRAMADOL HCL 50 MG PO TABS
100.0000 mg | ORAL_TABLET | Freq: Four times a day (QID) | ORAL | 1 refills | Status: DC | PRN
  Filled 2022-10-22: qty 240, 30d supply, fill #0
  Filled 2022-12-15: qty 240, 30d supply, fill #1

## 2022-09-05 MED ORDER — DULOXETINE HCL 60 MG PO CPEP
60.0000 mg | ORAL_CAPSULE | Freq: Every evening | ORAL | 1 refills | Status: DC
  Filled 2022-09-26: qty 30, 30d supply, fill #0
  Filled 2022-10-22: qty 30, 30d supply, fill #1

## 2022-09-09 ENCOUNTER — Other Ambulatory Visit (HOSPITAL_COMMUNITY): Payer: Self-pay

## 2022-09-12 ENCOUNTER — Encounter (INDEPENDENT_AMBULATORY_CARE_PROVIDER_SITE_OTHER): Payer: Self-pay | Admitting: Physician Assistant

## 2022-09-12 ENCOUNTER — Ambulatory Visit (INDEPENDENT_AMBULATORY_CARE_PROVIDER_SITE_OTHER): Payer: Commercial Managed Care - PPO | Admitting: Physician Assistant

## 2022-09-12 ENCOUNTER — Other Ambulatory Visit (HOSPITAL_COMMUNITY): Payer: Self-pay

## 2022-09-12 VITALS — BP 134/82 | HR 79 | Temp 98.3°F | Ht 61.0 in | Wt 152.0 lb

## 2022-09-12 DIAGNOSIS — E669 Obesity, unspecified: Secondary | ICD-10-CM | POA: Diagnosis not present

## 2022-09-12 DIAGNOSIS — E559 Vitamin D deficiency, unspecified: Secondary | ICD-10-CM

## 2022-09-12 DIAGNOSIS — Z6828 Body mass index (BMI) 28.0-28.9, adult: Secondary | ICD-10-CM

## 2022-09-12 DIAGNOSIS — R7303 Prediabetes: Secondary | ICD-10-CM | POA: Diagnosis not present

## 2022-09-12 MED ORDER — METFORMIN HCL 500 MG PO TABS
500.0000 mg | ORAL_TABLET | Freq: Two times a day (BID) | ORAL | 0 refills | Status: DC
  Filled 2022-09-12: qty 180, 90d supply, fill #0

## 2022-09-12 NOTE — Progress Notes (Signed)
Office: [phone]  /  Fax: [phone]  WEIGHT SUMMARY AND BIOMETRICS  Vitals Temp: 98.3 F (36.8 C) BP: 134/82 Pulse Rate: 79 SpO2: 100 %   Anthropometric Measurements Height: '5\' 1"'$  (1.549 m) Weight: 152 lb (68.9 kg) BMI (Calculated): 28.74 Weight at Last Visit: 155 lb Weight Lost Since Last Visit: 3 lb Starting Weight: 198 lb Total Weight Loss (lbs): 46 lb (20.9 kg)   Body Composition  Body Fat %: 38.1 % Fat Mass (lbs): 58 lbs Muscle Mass (lbs): 89.4 lbs Total Body Water (lbs): 61.6 lbs Visceral Fat Rating : 9   Other Clinical Data Fasting: yes Labs: yes Today's Visit #: 83 Starting Date: 08/30/18     HPI  Chief Complaint: OBESITY  Katherine Tanner is here to discuss her progress with her obesity treatment plan. She is on the the Category 2 Plan and states she is following her eating plan approximately 90 % of the time. She states she is exercising weights/bike 20/30 minutes 5 times per week.   Interval History:  Since last office visit she has done well with weight loss. She is down 3 lbs and has maintained muscle mass well. Hunger and appetite well controlled. She has recently been on steroids for her asthma, but did well following this course. She is lifting weights 20 minutes 3 times weekly and biking 2 x weekly.   She is holding off on medication for menopause as did not want additional medication. Using a fan to keep cool at night. Discussed exercise and continued weight loss should help with menopausal symptoms as well. BP at goal today.    Pharmacotherapy: Metformin for Prediabetes.   PHYSICAL EXAM:  Blood pressure 134/82, pulse 79, temperature 98.3 F (36.8 C), height '5\' 1"'$  (1.549 m), weight 152 lb (68.9 kg), last menstrual period 02/20/2014, SpO2 100 %. Body mass index is 28.72 kg/m.  General: She is overweight, cooperative, alert, well developed, and in no acute distress. PSYCH: Has normal mood, affect and thought process.   Lungs: Normal  breathing effort, no conversational dyspnea.  DIAGNOSTIC DATA REVIEWED:  BMET    Component Value Date/Time   NA 142 07/11/2022 0754   NA 145 (H) 03/28/2022 1250   K 4.2 07/11/2022 0754   CL 102 07/11/2022 0754   CO2 34 (H) 07/11/2022 0754   GLUCOSE 103 (H) 07/11/2022 0754   BUN 16 07/11/2022 0754   BUN 13 03/28/2022 1250   CREATININE 0.58 07/11/2022 0754   CALCIUM 9.6 07/11/2022 0754   GFRNONAA >60 05/20/2022 1435   GFRAA 123 03/23/2020 1306   Lab Results  Component Value Date   HGBA1C 5.8 07/11/2022   HGBA1C 6.2 02/24/2014   Lab Results  Component Value Date   INSULIN 6.7 03/28/2022   INSULIN 9.3 08/30/2018   Lab Results  Component Value Date   TSH 0.61 07/11/2022   CBC    Component Value Date/Time   WBC 6.4 08/22/2022 1343   RBC 4.71 08/22/2022 1343   HGB 13.7 08/22/2022 1343   HGB 13.3 11/25/2020 0921   HGB 12.9 12/24/2019 1627   HCT 42.1 08/22/2022 1343   HCT 40.2 12/24/2019 1627   PLT 91 (L) 08/22/2022 1343   PLT 154 11/25/2020 0921   PLT 135 (L) 12/24/2019 1627   MCV 89.4 08/22/2022 1343   MCV 87 12/24/2019 1627   MCH 29.1 08/22/2022 1343   MCHC 32.5 08/22/2022 1343   RDW 13.3 08/22/2022 1343   RDW 13.2 12/24/2019 1627   Iron Studies  Component Value Date/Time   IRON 63 07/07/2020 1334   Lipid Panel     Component Value Date/Time   CHOL 142 07/11/2022 0754   CHOL 159 03/22/2021 1237   TRIG 32.0 07/11/2022 0754   HDL 64.10 07/11/2022 0754   HDL 68 03/22/2021 1237   CHOLHDL 2 07/11/2022 0754   VLDL 6.4 07/11/2022 0754   LDLCALC 71 07/11/2022 0754   LDLCALC 81 03/22/2021 1237   LDLDIRECT 147.9 12/17/2012 1604   Hepatic Function Panel     Component Value Date/Time   PROT 6.6 07/11/2022 0754   PROT 7.1 03/28/2022 1250   ALBUMIN 4.1 07/11/2022 0754   ALBUMIN 4.6 03/28/2022 1250   AST 23 07/11/2022 0754   ALT 27 07/11/2022 0754   ALKPHOS 88 07/11/2022 0754   BILITOT 0.4 07/11/2022 0754   BILITOT 0.3 03/28/2022 1250   BILIDIR 0.1  01/02/2012 1626   IBILI 0.3 01/05/2010 2038      Component Value Date/Time   TSH 0.61 07/11/2022 0754   Nutritional Lab Results  Component Value Date   VD25OH 42.04 07/11/2022   VD25OH 45.1 03/28/2022   VD25OH 67.1 12/20/2021     ASSESSMENT AND PLAN  1. Prediabetes Prediabetes Last A1c was 5.8  Medication(s):  Metformin 500 mg twice daily with meals No side effects with metformin.  Lab Results  Component Value Date   HGBA1C 5.8 07/11/2022   HGBA1C 5.6 03/28/2022   HGBA1C 5.6 12/20/2021   HGBA1C 5.6 09/28/2021   HGBA1C 5.8 07/09/2021   Lab Results  Component Value Date   INSULIN 6.7 03/28/2022   INSULIN 10.0 12/20/2021   INSULIN 3.7 09/28/2021   INSULIN 7.0 06/24/2021   INSULIN 6.9 03/22/2021    Plan: A1c is at goal. Plan to check fasting Insulin level today. Other labs done in Jan. 2024 Continue and refill Metformin 500 mg twice daily with meals Continue working on nutrition plan to decrease simple carbohydrates, increase lean proteins and exercise to promote weight loss, improve glycemic control and prevent progression to Type 2 diabetes.    2. Vitamin D deficiency Vitamin D Deficiency Vitamin D is not at goal of 50.  Most recent vitamin D level was 42.04. She is on OTC vitamin D3 1000 IU daily. Lab Results  Component Value Date   VD25OH 42.04 07/11/2022   VD25OH 45.1 03/28/2022   VD25OH 67.1 12/20/2021    Plan: Continue vitamin D 1000 IU daily and Calcium tablet with vitamin D.    3. Obesity- Start BMI 37.41 4. Current BMI 28.8 Has done well with weight loss.  Down 46 lbs TBW loss 23.23%   TREATMENT PLAN FOR OBESITY:  Recommended Dietary Goals  [name] is currently in the action stage of change. As such, her goal is to continue weight management plan. She has agreed to the Category 2 Plan.  Behavioral Intervention  We discussed the following Behavioral Modification Strategies today: increasing lean protein intake, decreasing simple  carbohydrates , increasing vegetables, and increasing water intake.  Additional resources provided today: NA  Recommended Physical Activity Goals  [name] has been advised to work up to 150 minutes of moderate intensity aerobic activity a week and strengthening exercises 2-3 times per week for cardiovascular health, weight loss maintenance and preservation of muscle mass.   She has agreed to continue physical activity as is.    Pharmacotherapy We discussed various medication options to help Cyan with her weight loss efforts and we both agreed to continue metformin for prediabetes as primary indication.  We will recheck fasting insulin today.  Return in about 6 weeks (around 10/24/2022).[name] She was informed of the importance of frequent follow up visits to maximize her success with intensive lifestyle modifications for her multiple health conditions.   ATTESTASTION STATEMENTS:  Reviewed by clinician on day of visit: allergies, medications, problem list, medical history, surgical history, family history, social history, and previous encounter notes.   I have personally spent 30+ minutes total time today in preparation, patient care, nutritional counseling and documentation for this visit, including the following: review of clinical lab tests; review of medical tests/procedures/services.      Savir Blanke, PA-C

## 2022-09-13 LAB — INSULIN, RANDOM: INSULIN: 6.9 u[IU]/mL (ref 2.6–24.9)

## 2022-09-19 ENCOUNTER — Other Ambulatory Visit (HOSPITAL_COMMUNITY): Payer: Self-pay

## 2022-09-19 ENCOUNTER — Other Ambulatory Visit: Payer: Self-pay

## 2022-09-20 ENCOUNTER — Other Ambulatory Visit (HOSPITAL_COMMUNITY): Payer: Self-pay

## 2022-09-27 ENCOUNTER — Other Ambulatory Visit: Payer: Self-pay

## 2022-09-27 ENCOUNTER — Other Ambulatory Visit (HOSPITAL_COMMUNITY): Payer: Self-pay

## 2022-10-24 ENCOUNTER — Encounter (INDEPENDENT_AMBULATORY_CARE_PROVIDER_SITE_OTHER): Payer: Self-pay | Admitting: Physician Assistant

## 2022-10-24 ENCOUNTER — Ambulatory Visit (INDEPENDENT_AMBULATORY_CARE_PROVIDER_SITE_OTHER): Payer: Commercial Managed Care - PPO | Admitting: Physician Assistant

## 2022-10-24 ENCOUNTER — Other Ambulatory Visit (HOSPITAL_COMMUNITY): Payer: Self-pay

## 2022-10-24 ENCOUNTER — Other Ambulatory Visit: Payer: Self-pay

## 2022-10-24 VITALS — BP 147/83 | HR 72 | Temp 97.9°F | Ht 61.0 in | Wt 152.0 lb

## 2022-10-24 DIAGNOSIS — E669 Obesity, unspecified: Secondary | ICD-10-CM | POA: Diagnosis not present

## 2022-10-24 DIAGNOSIS — R7303 Prediabetes: Secondary | ICD-10-CM | POA: Diagnosis not present

## 2022-10-24 DIAGNOSIS — Z6828 Body mass index (BMI) 28.0-28.9, adult: Secondary | ICD-10-CM

## 2022-10-24 DIAGNOSIS — I10 Essential (primary) hypertension: Secondary | ICD-10-CM

## 2022-10-24 NOTE — Progress Notes (Signed)
Office: [phone]  /  Fax: [phone]  WEIGHT SUMMARY AND BIOMETRICS  Vitals Temp: 97.9 F (36.6 C) BP: (!) 147/83 Pulse Rate: 72 SpO2: 100 %   Anthropometric Measurements Height: 5\' 1"  (1.549 m) Weight: 152 lb (68.9 kg) BMI (Calculated): 28.74 Weight at Last Visit: 152 lb Weight Lost Since Last Visit: 0 lb Weight Gained Since Last Visit: 0 lb Starting Weight: 198 lb Total Weight Loss (lbs): 46 lb (20.9 kg)   Body Composition  Body Fat %: 38.8 % Fat Mass (lbs): 59.2 lbs Muscle Mass (lbs): 88.4 lbs Total Body Water (lbs): 61.6 lbs Visceral Fat Rating : 10   Other Clinical Data Fasting: No Labs: No Today's Visit #: 63 Starting Date: 08/30/18     HPI  Chief Complaint: OBESITY  Katherine Tanner is here to discuss her progress with her obesity treatment plan. She is on the the Category 2 Plan and states she is following her eating plan approximately 90 % of the time. She states she is exercising/walking/you tube 30 minutes 5 times per week.   Interval History:  Since last office visit she has maintained weight loss. She is down a total of 46 lbs!  TBW loss of 23.2% !  Hunger/appetite well controlled.  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   Has some time off scheduled for a beach trip with her daughter and Anniversary with husband in June.    Maintenance therapy at this time.   Pharmacotherapy: Metformin 500 mg twice daily. Some mild nausea at times, usually resolved by eating something. No other associated symptoms or concerns.  No recent EGD/ Last colonoscopy 10/14/2019- hyperplastic polyps x 2 -planned repeat at 5 years- [facility]  PHYSICAL EXAM:  Blood pressure (!) 147/83, pulse 72, temperature 97.9 F (36.6 C), height 5\' 1"  (1.549 m), weight 152 lb (68.9 kg), last menstrual period 02/20/2014, SpO2 100 %. Body mass index is 28.72 kg/m.  General: She is overweight, cooperative, alert, well developed, and in no  acute distress. PSYCH: Has normal mood, affect and thought process.   Cardiovascular: regular rhythm. BP up a little today, but just took medications.  Lungs: Normal breathing effort, no conversational dyspnea. Neuro: intact.   DIAGNOSTIC DATA REVIEWED:  BMET    Component Value Date/Time   NA 142 07/11/2022 0754   NA 145 (H) 03/28/2022 1250   K 4.2 07/11/2022 0754   CL 102 07/11/2022 0754   CO2 34 (H) 07/11/2022 0754   GLUCOSE 103 (H) 07/11/2022 0754   BUN 16 07/11/2022 0754   BUN 13 03/28/2022 1250   CREATININE 0.58 07/11/2022 0754   CALCIUM 9.6 07/11/2022 0754   GFRNONAA >60 05/20/2022 1435   GFRAA 123 03/23/2020 1306   Lab Results  Component Value Date   HGBA1C 5.8 07/11/2022   HGBA1C 6.2 02/24/2014   Lab Results  Component Value Date   INSULIN 6.9 09/12/2022   INSULIN 9.3 08/30/2018   Lab Results  Component Value Date   TSH 0.61 07/11/2022   CBC    Component Value Date/Time   WBC 6.4 08/22/2022 1343   RBC 4.71 08/22/2022 1343   HGB 13.7 08/22/2022 1343   HGB 13.3 11/25/2020 0921   HGB 12.9 12/24/2019 1627   HCT 42.1 08/22/2022 1343   HCT 40.2 12/24/2019 1627   PLT 91 (L) 08/22/2022 1343   PLT 154 11/25/2020 0921   PLT 135 (L) 12/24/2019 1627   MCV 89.4 08/22/2022 1343  MCV 87 12/24/2019 1627   MCH 29.1 08/22/2022 1343   MCHC 32.5 08/22/2022 1343   RDW 13.3 08/22/2022 1343   RDW 13.2 12/24/2019 1627   Iron Studies    Component Value Date/Time   IRON 63 07/07/2020 1334   Lipid Panel     Component Value Date/Time   CHOL 142 07/11/2022 0754   CHOL 159 03/22/2021 1237   TRIG 32.0 07/11/2022 0754   HDL 64.10 07/11/2022 0754   HDL 68 03/22/2021 1237   CHOLHDL 2 07/11/2022 0754   VLDL 6.4 07/11/2022 0754   LDLCALC 71 07/11/2022 0754   LDLCALC 81 03/22/2021 1237   LDLDIRECT 147.9 12/17/2012 1604   Hepatic Function Panel     Component Value Date/Time   PROT 6.6 07/11/2022 0754   PROT 7.1 03/28/2022 1250   ALBUMIN 4.1 07/11/2022 0754    ALBUMIN 4.6 03/28/2022 1250   AST 23 07/11/2022 0754   ALT 27 07/11/2022 0754   ALKPHOS 88 07/11/2022 0754   BILITOT 0.4 07/11/2022 0754   BILITOT 0.3 03/28/2022 1250   BILIDIR 0.1 01/02/2012 1626   IBILI 0.3 01/05/2010 2038      Component Value Date/Time   TSH 0.61 07/11/2022 0754   Nutritional Lab Results  Component Value Date   VD25OH 42.04 07/11/2022   VD25OH 45.1 03/28/2022   VD25OH 67.1 12/20/2021    ASSOCIATED CONDITIONS ADDRESSED TODAY  ASSESSMENT AND PLAN  Problem List Items Addressed This Visit     Essential hypertension    Hypertension Hypertension reasonably well controlled, no significant medication side effects noted, and needs further observation.  Medication(s): losartan- HCTZ 100-25 mg daily  BP Readings from Last 3 Encounters:  10/24/22 (!) 147/83  09/12/22 134/82  07/26/22 112/64   Lab Results  Component Value Date   CREATININE 0.58 07/11/2022   CREATININE 0.58 05/20/2022   CREATININE 0.51 (L) 03/28/2022   Lab Results  Component Value Date   GFR 98.75 07/11/2022   GFR 100.29 07/09/2021   GFR 99.74 07/07/2020   Plan: Continue all antihypertensives at current dosages. Monitor closely.  Continue to work on nutrition plan to promote weight loss and improve BP control.         Prediabetes - Primary    Prediabetes Last A1c was 5.8- stable. Insulin 6.9- improved .  Medication(s): Metformin 500 mg twice daily with meals Mild nausea at times which resolves once eats something. Not taking on empty stomach. Discussed may need further evaluation if persists with PCP and ? GI evaluation. No hx of EGD in recent years. No other associated symptoms. No chest pain, SOB or palpitations.  Lab Results  Component Value Date   HGBA1C 5.8 07/11/2022   HGBA1C 5.6 03/28/2022   HGBA1C 5.6 12/20/2021   HGBA1C 5.6 09/28/2021   HGBA1C 5.8 07/09/2021   Lab Results  Component Value Date   INSULIN 6.9 09/12/2022   INSULIN 6.7 03/28/2022   INSULIN 10.0  12/20/2021   INSULIN 3.7 09/28/2021   INSULIN 7.0 06/24/2021   Plan: Continue Metformin 500 mg twice daily with meals Monitor mild nausea and see if PCP if persists.  Continue working on nutrition plan to decrease simple carbohydrates, increase lean proteins and exercise to promote weight loss, improve glycemic control and prevent progression to Type 2 diabetes.         BMI 28.0-28.9,adult   Generalized obesity   Current BMI 28.8 Since last office visit she has maintained weight loss. She is down a total of 46 lbs!  TBW loss of 23.2% !   TREATMENT PLAN FOR OBESITY:  Recommended Dietary Goals  Katherine Tanner is currently in the action stage of change. As such, her goal is to continue weight management plan. She has agreed to the Category 2 Plan.  Behavioral Intervention  We discussed the following Behavioral Modification Strategies today: increasing lean protein intake, decreasing simple carbohydrates , increasing vegetables, increasing lower glycemic fruits, increasing water intake, continue to practice mindfulness when eating, and planning for success.  Additional resources provided today: NA  Recommended Physical Activity Goals  Katherine Tanner has been advised to work up to 150 minutes of moderate intensity aerobic activity a week and strengthening exercises 2-3 times per week for cardiovascular health, weight loss maintenance and preservation of muscle mass.   She has agreed to Continue current level of physical activity    Pharmacotherapy We discussed various medication options to help [facility] with her weight loss efforts and we both agreed to continue metformin for prediabetes and continue to work on nutritional and behavioral strategies to promote weight loss.     Return in about 6 weeks (around 12/05/2022).[name] She was informed of the importance of frequent follow up visits to maximize her success with intensive lifestyle modifications for her multiple health conditions.   ATTESTASTION  STATEMENTS:  Reviewed by clinician on day of visit: allergies, medications, problem list, medical history, surgical history, family history, social history, and previous encounter notes.   I have personally spent 32 minutes total time today in preparation, patient care, nutritional counseling and documentation for this visit, including the following: review of clinical lab tests; review of medical tests/procedures/services.      Eugenia Eldredge, PA-C

## 2022-10-24 NOTE — Assessment & Plan Note (Signed)
Hypertension Hypertension reasonably well controlled, no significant medication side effects noted, and needs further observation.  Medication(s): losartan- HCTZ 100-25 mg daily  BP Readings from Last 3 Encounters:  10/24/22 (!) 147/83  09/12/22 134/82  07/26/22 112/64   Lab Results  Component Value Date   CREATININE 0.58 07/11/2022   CREATININE 0.58 05/20/2022   CREATININE 0.51 (L) 03/28/2022   Lab Results  Component Value Date   GFR 98.75 07/11/2022   GFR 100.29 07/09/2021   GFR 99.74 07/07/2020    Plan: Continue all antihypertensives at current dosages. Monitor closely.  Continue to work on nutrition plan to promote weight loss and improve BP control.

## 2022-10-24 NOTE — Assessment & Plan Note (Signed)
Prediabetes Last A1c was 5.8- stable. Insulin 6.9- improved .  Medication(s): Metformin 500 mg twice daily with meals Mild nausea at times which resolves once eats something. Not taking on empty stomach. Discussed may need further evaluation if persists with PCP and ? GI evaluation. No hx of EGD in recent years. No other associated symptoms. No chest pain, SOB or palpitations.  Lab Results  Component Value Date   HGBA1C 5.8 07/11/2022   HGBA1C 5.6 03/28/2022   HGBA1C 5.6 12/20/2021   HGBA1C 5.6 09/28/2021   HGBA1C 5.8 07/09/2021   Lab Results  Component Value Date   INSULIN 6.9 09/12/2022   INSULIN 6.7 03/28/2022   INSULIN 10.0 12/20/2021   INSULIN 3.7 09/28/2021   INSULIN 7.0 06/24/2021    Plan: Continue Metformin 500 mg twice daily with meals Monitor mild nausea and see if PCP if persists.  Continue working on nutrition plan to decrease simple carbohydrates, increase lean proteins and exercise to promote weight loss, improve glycemic control and prevent progression to Type 2 diabetes.

## 2022-11-03 DIAGNOSIS — H5203 Hypermetropia, bilateral: Secondary | ICD-10-CM | POA: Diagnosis not present

## 2022-11-03 DIAGNOSIS — H524 Presbyopia: Secondary | ICD-10-CM | POA: Diagnosis not present

## 2022-11-03 DIAGNOSIS — H52223 Regular astigmatism, bilateral: Secondary | ICD-10-CM | POA: Diagnosis not present

## 2022-11-03 DIAGNOSIS — Z83511 Family history of glaucoma: Secondary | ICD-10-CM | POA: Diagnosis not present

## 2022-11-03 DIAGNOSIS — H04123 Dry eye syndrome of bilateral lacrimal glands: Secondary | ICD-10-CM | POA: Diagnosis not present

## 2022-11-08 ENCOUNTER — Telehealth: Payer: Self-pay | Admitting: Internal Medicine

## 2022-11-08 ENCOUNTER — Other Ambulatory Visit (HOSPITAL_COMMUNITY): Payer: Self-pay

## 2022-11-08 ENCOUNTER — Other Ambulatory Visit: Payer: Self-pay

## 2022-11-08 ENCOUNTER — Other Ambulatory Visit: Payer: Self-pay | Admitting: Internal Medicine

## 2022-11-08 MED ORDER — IPRATROPIUM-ALBUTEROL 0.5-2.5 (3) MG/3ML IN SOLN
3.0000 mL | Freq: Four times a day (QID) | RESPIRATORY_TRACT | 12 refills | Status: AC | PRN
  Filled 2022-11-08: qty 270, 22d supply, fill #0
  Filled 2022-11-08: qty 90, 8d supply, fill #0

## 2022-11-08 NOTE — Telephone Encounter (Signed)
I sent script for Duoneb to [facility]

## 2022-11-08 NOTE — Telephone Encounter (Signed)
Patient states needs refill for Nebulizer solution. Patient out of medication. Pharmacy is [facility]. Patient phone number is [phone].

## 2022-11-08 NOTE — Telephone Encounter (Signed)
Called and spoke with pt, pt had a nebulizer machine. Pt states her asthma is acting up and is requesting for a new prescription for nebulizer medication. Dr Maple Hudson please advise

## 2022-11-08 NOTE — Telephone Encounter (Signed)
Pharmacy calling to get refills for duo neb. For pt.

## 2022-11-08 NOTE — Telephone Encounter (Signed)
The duoneb was refilled today 11/08/22    ipratropium-albuterol (DUONEB) 0.5-2.5 (3) MG/3ML SOLN 360 mL 12 11/08/2022    Sig - Route: Inhale 1 vial via nebulizer every 6 (six) hours as needed. - Nebulization   Sent to pharmacy as: ipratropium-albuterol (DUONEB) 0.5-2.5 (3) MG/3ML Solution

## 2022-11-08 NOTE — Telephone Encounter (Signed)
Called pt no answer lvmm for pt to call the office back.

## 2022-11-09 ENCOUNTER — Other Ambulatory Visit (HOSPITAL_COMMUNITY): Payer: Self-pay

## 2022-11-09 ENCOUNTER — Ambulatory Visit
Admission: RE | Admit: 2022-11-09 | Discharge: 2022-11-09 | Disposition: A | Discharge status: Home / Self Care | Payer: Commercial Managed Care - PPO | Source: Ambulatory Visit | Attending: Family Medicine | Admitting: Family Medicine

## 2022-11-09 DIAGNOSIS — Z1231 Encounter for screening mammogram for malignant neoplasm of breast: Secondary | ICD-10-CM | POA: Diagnosis not present

## 2022-11-09 DIAGNOSIS — R921 Mammographic calcification found on diagnostic imaging of breast: Secondary | ICD-10-CM

## 2022-11-09 NOTE — Telephone Encounter (Signed)
Called and informed pt about prescription being sent into the pharmacy. Pt verbalized understanding.  Pt also stated if her breathing did not get better or change she will call in for a ov. Pt has been having problems with her asthma but denied wanting a OV at this time.Nothing further needed

## 2022-11-15 ENCOUNTER — Other Ambulatory Visit: Payer: Self-pay | Admitting: Family Medicine

## 2022-11-15 DIAGNOSIS — R921 Mammographic calcification found on diagnostic imaging of breast: Secondary | ICD-10-CM

## 2022-11-17 ENCOUNTER — Other Ambulatory Visit: Payer: Self-pay

## 2022-11-17 DIAGNOSIS — D696 Thrombocytopenia, unspecified: Secondary | ICD-10-CM

## 2022-11-18 ENCOUNTER — Encounter: Payer: Self-pay | Admitting: Internal Medicine

## 2022-11-18 ENCOUNTER — Ambulatory Visit: Payer: Self-pay | Admitting: Internal Medicine

## 2022-11-18 ENCOUNTER — Inpatient Hospital Stay: Payer: Commercial Managed Care - PPO | Attending: Internal Medicine

## 2022-11-18 ENCOUNTER — Other Ambulatory Visit: Payer: Self-pay

## 2022-11-18 ENCOUNTER — Inpatient Hospital Stay (HOSPITAL_BASED_OUTPATIENT_CLINIC_OR_DEPARTMENT_OTHER): Payer: Commercial Managed Care - PPO | Admitting: Internal Medicine

## 2022-11-18 VITALS — BP 134/86 | HR 81 | Temp 98.5°F | Wt 161.7 lb

## 2022-11-18 DIAGNOSIS — D696 Thrombocytopenia, unspecified: Secondary | ICD-10-CM

## 2022-11-18 LAB — CBC WITH DIFFERENTIAL/PLATELET
Abs Immature Granulocytes: 0.01 10*3/uL (ref 0.00–0.07)
Basophils Absolute: 0.1 10*3/uL (ref 0.0–0.1)
Basophils Relative: 1 %
Eosinophils Absolute: 0.1 10*3/uL (ref 0.0–0.5)
Eosinophils Relative: 2 %
HCT: 38 % (ref 36.0–46.0)
Hemoglobin: 12.2 g/dL (ref 12.0–15.0)
Immature Granulocytes: 0 %
Lymphocytes Relative: 42 %
Lymphs Abs: 2.4 10*3/uL (ref 0.7–4.0)
MCH: 29.2 pg (ref 26.0–34.0)
MCHC: 32.1 g/dL (ref 30.0–36.0)
MCV: 90.9 fL (ref 80.0–100.0)
Monocytes Absolute: 0.8 10*3/uL (ref 0.1–1.0)
Monocytes Relative: 13 %
Neutro Abs: 2.5 10*3/uL (ref 1.7–7.7)
Neutrophils Relative %: 42 %
Platelets: 148 10*3/uL — ABNORMAL LOW (ref 150–400)
RBC: 4.18 MIL/uL (ref 3.87–5.11)
RDW: 13.9 % (ref 11.5–15.5)
WBC: 5.9 10*3/uL (ref 4.0–10.5)
nRBC: 0 % (ref 0.0–0.2)

## 2022-11-18 LAB — BASIC METABOLIC PANEL
Anion gap: 9 (ref 5–15)
BUN: 19 mg/dL (ref 6–20)
CO2: 29 mmol/L (ref 22–32)
Calcium: 9.4 mg/dL (ref 8.9–10.3)
Chloride: 101 mmol/L (ref 98–111)
Creatinine, Ser: 0.6 mg/dL (ref 0.44–1.00)
GFR, Estimated: >60 mL/min (ref >60)
Glucose, Bld: 83 mg/dL (ref 70–99)
Potassium: 4.1 mmol/L (ref 3.5–5.1)
Sodium: 139 mmol/L (ref 135–145)

## 2022-11-18 NOTE — Addendum Note (Signed)
Addended by: Darrold Span A on: 11/18/2022 03:39 PM   Modules accepted: Orders

## 2022-11-18 NOTE — Assessment & Plan Note (Addendum)
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   # Unfortunately CBC not drawn today.  Patient feels asymptomatic; wants to hold off any repeat blood work today.  # Given stability of her platelets, I think it is reasonable to space out the blood checks to every 3 months.  However if patient notices anything abnormal/easy bruising or nosebleeds.  She will call us sooner.  # DISPOSITION: # 3 month- labs-cbc # follow up in 6 months- MD; labs- cbc/bmp-- Dr.B

## 2022-11-18 NOTE — Progress Notes (Signed)
[facility] CONSULT NOTE  Patient Care Team: Tower, Audrie Gallus, MD as PCP - General Earna Coder, MD as Consulting Physician (Internal Medicine)  CHIEF COMPLAINTS/PURPOSE OF CONSULTATION: Thrombocytopenia  # THROMBOCYTOPENIA: [since 2019 > 100]; 2023- 70-80s; Normal- WBC/platelets [Dr.Shadad; last May 2022]; HIV/hepatitis negative as per pt [[facility]- dx:PN]  #Intentional weight loss [~50 pounds over the last 1 year]  HISTORY OF PRESENTING ILLNESS: Ambulating independently.  Alone.  Katherine Tanner 62 y.o.  female patient with longstanding history of intermittent thrombocytopenia clinically ITP is here for follow-up.  Hx of nose bleed in 2-3 months ago. Otherwise currently resolved. Patient has not had any further bleeding.   Review of Systems  Constitutional:  Positive for malaise/fatigue. Negative for chills, diaphoresis, fever and weight loss.  HENT:  Negative for nosebleeds and sore throat.   Eyes:  Negative for double vision.  Respiratory:  Negative for cough, hemoptysis, sputum production, shortness of breath and wheezing.   Cardiovascular:  Negative for chest pain, palpitations, orthopnea and leg swelling.  Gastrointestinal:  Negative for abdominal pain, blood in stool, constipation, diarrhea, heartburn, melena, nausea and vomiting.  Genitourinary:  Negative for dysuria, frequency and urgency.  Musculoskeletal:  Positive for back pain and joint pain.  Skin: Negative.  Negative for itching and rash.  Neurological:  Negative for dizziness, tingling, focal weakness, weakness and headaches.  Endo/Heme/Allergies:  Does not bruise/bleed easily.  Psychiatric/Behavioral:  Negative for depression. The patient is not nervous/anxious and does not have insomnia.      MEDICAL HISTORY:  Past Medical History:  Diagnosis Date   Allergy    takes Claritin daily and Flonase daily   Arthritis    Asthma    uses Albuterol daily as needed;Singulair  nightly;DUlera daily   Back pain    Constipation    Fatty liver    Food allergy    GERD (gastroesophageal reflux disease)    occasionally will take OTC meds but states she just watches what she eats   H/O hiatal hernia    History of bronchitis    last time 14yrs ago   History of colon polyps    History of kidney stones    History of shingles    Hyperlipidemia    Hypertension    takes Hyzaar daily   Joint pain    Joint pain    Joint swelling    Obesity    Osteoarthritis    Pneumonia    hx of;last time at least 34yrs ago   PONV (postoperative nausea and vomiting)    Prediabetes    Shortness of breath    Sleep apnea    Swelling of lower extremity    Trouble in sleeping    Wheezing     SURGICAL HISTORY: Past Surgical History:  Procedure Laterality Date   BREAST EXCISIONAL BIOPSY Right    CARDIAC CATHETERIZATION     COLONOSCOPY     ESOPHAGOGASTRODUODENOSCOPY     KNEE SURGERY Right    arthroscopy   Right ganglion cyst     x 2   right lumpectomy  around 1983   STERIOD INJECTION Left 11/15/2013   Procedure: Marcaine/STEROID INJECTION;  Surgeon: Harvie Junior, MD;  Location: [facility] OR;  Service: Orthopedics;  Laterality: Left;   TOTAL KNEE ARTHROPLASTY Right 11/15/2013   Procedure: RIGHT TOTAL KNEE ARTHROPLASTY;  Surgeon: Harvie Junior, MD;  Location: [facility] OR;  Service: Orthopedics;  Laterality: Right;   TOTAL KNEE ARTHROPLASTY Left 12/30/2016   Procedure: TOTAL KNEE  ARTHROPLASTY;  Surgeon: Jodi Geralds, MD;  Location: [facility] OR;  Service: Orthopedics;  Laterality: Left;   TUBAL LIGATION      SOCIAL HISTORY: Social History   Socioeconomic History   Marital status: Married    Spouse name: Casimiro Needle   Number of children: 1   Years of education: Not on file   Highest education level: Not on file  Occupational History   Occupation: Medical laboratory scientific officer: [employer]    Employer: [employer]  Tobacco Use   Smoking status: Never   Smokeless tobacco: Never  Vaping Use    Vaping Use: Never used  Substance and Sexual Activity   Alcohol use: No    Alcohol/week: 0.0 standard drinks of alcohol   Drug use: No   Sexual activity: Yes  Other Topics Concern   Not on file  Social History Narrative   Lives in [place]; works at [employer].  No alcohol.   Social Determinants of Health   Financial Resource Strain: Not on file  Food Insecurity: Not on file  Transportation Needs: Not on file  Physical Activity: Not on file  Stress: Not on file  Social Connections: Not on file  Intimate Partner Violence: Not on file    FAMILY HISTORY: Family History  Problem Relation Age of Onset   Diabetes Father    Cancer Father    Liver disease Father    Alcoholism Father    Sarcoidosis Mother    Glaucoma Mother    Hypertension Mother    Hyperlipidemia Mother    Sleep apnea Mother    Obesity Mother    Breast cancer Neg Hx     ALLERGIES:  is allergic to ace inhibitors, amoxicillin-pot clavulanate, influenza virus vacc split pf, and shellfish allergy.  MEDICATIONS:  Current Outpatient Medications  Medication Sig Dispense Refill   albuterol (VENTOLIN HFA) 108 (90 Base) MCG/ACT inhaler Inhale 2 puffs into the lungs every 6 (six) hours as needed for wheezing or shortness of breath 6.7 g 12   baclofen (LIORESAL) 10 MG tablet Take 1 tablet (10 mg total) by mouth 2 (two) times daily as needed. 60 tablet 3   Calcium Citrate-Vitamin D (CALCIUM + D PO) Take 1 tablet by mouth daily.     Cholecalciferol (VITAMIN D3) 25 MCG (1000 UT) CAPS Take 1 capsule (1,000 Units total) by mouth daily. 30 capsule 0   diclofenac Sodium (VOLTAREN) 1 % GEL Apply 4 grams to skin four times a day 960 g 5   DULoxetine (CYMBALTA) 60 MG capsule Take 1 capsule (60 mg total) by mouth at bedtime. 30 capsule 1   fluticasone-salmeterol (ADVAIR DISKUS) 250-50 MCG/ACT AEPB Inhale 1 puff into the lungs 2 times daily then rinse mouth 60 each 10   ipratropium-albuterol (DUONEB) 0.5-2.5 (3) MG/3ML SOLN  Inhale 1 vial via nebulizer every 6 (six) hours as needed. 360 mL 12   loratadine (CLARITIN) 10 MG tablet Take 10 mg by mouth daily.     losartan-hydrochlorothiazide (HYZAAR) 100-25 MG tablet TAKE 1 TABLET BY MOUTH ONCE DAILY 90 tablet 3   meloxicam (MOBIC) 15 MG tablet Take 1 tablet (15 mg total) by mouth daily as needed for pain 30 tablet 1   metFORMIN (GLUCOPHAGE) 500 MG tablet Take 1 tablet by mouth 2 times daily (with breakfast and with lunch.) 180 tablet 0   montelukast (SINGULAIR) 10 MG tablet Take 1 tablet (10 mg total) by mouth at bedtime. 30 tablet 11   Multiple Vitamin (MULTIVITAMIN  PO) Take 1 tablet by mouth daily.     Nebulizers (COMPRESSOR/NEBULIZER) MISC Use as directed 1 each 0   rosuvastatin (CRESTOR) 10 MG tablet Take 1 tablet (10 mg total) by mouth daily. 90 tablet 3   traMADol (ULTRAM) 50 MG tablet Take 2 tablet by mouth four times a day as needed for pain 240 tablet 1   traMADol (ULTRAM) 50 MG tablet Take 2 tablets (100 mg total) by mouth 4 (four) times daily as needed for pain 240 tablet 1   EPINEPHRINE 0.3 mg/0.3 mL IJ SOAJ injection INJECT 1 SYRINGE INTO THE MUSCLE ONCE (Patient not taking: Reported on 11/18/2022) 1 each 1   No current facility-administered medications for this visit.    PHYSICAL EXAMINATION:  Vitals:   11/18/22 1505  BP: 134/86  Pulse: 81  Temp: 98.5 F (36.9 C)  SpO2: 100%    Filed Weights   11/18/22 1505  Weight: 161 lb 11.2 oz (73.3 kg)     Physical Exam Vitals and nursing note reviewed.  HENT:     Head: Normocephalic and atraumatic.     Mouth/Throat:     Pharynx: Oropharynx is clear.  Eyes:     Extraocular Movements: Extraocular movements intact.     Pupils: Pupils are equal, round, and reactive to light.  Cardiovascular:     Rate and Rhythm: Normal rate and regular rhythm.  Pulmonary:     Comments: Decreased breath sounds bilaterally.  Abdominal:     Palpations: Abdomen is soft.  Musculoskeletal:        General: Normal  range of motion.     Cervical back: Normal range of motion.  Skin:    General: Skin is warm.  Neurological:     General: No focal deficit present.     Mental Status: She is alert and oriented to person, place, and time.  Psychiatric:        Behavior: Behavior normal.        Judgment: Judgment normal.      LABORATORY DATA:  I have reviewed the data as listed Lab Results  Component Value Date   WBC 6.4 08/22/2022   HGB 13.7 08/22/2022   HCT 42.1 08/22/2022   MCV 89.4 08/22/2022   PLT 91 (L) 08/22/2022   Recent Labs    03/28/22 1250 05/20/22 1435 07/11/22 0754 11/18/22 1447  NA 145* 135 142 139  K 4.1 4.4 4.2 4.1  CL 101 99 102 101  CO2 26 28 34* 29  GLUCOSE 93 89 103* 83  BUN 13 23* 16 19  CREATININE 0.51* 0.58 0.58 0.60  CALCIUM 9.9 9.3 9.6 9.4  GFRNONAA  --  >60  --  >60  PROT 7.1  --  6.6  --   ALBUMIN 4.6  --  4.1  --   AST 26  --  23  --   ALT 38*  --  27  --   ALKPHOS 131*  --  88  --   BILITOT 0.3  --  0.4  --     RADIOGRAPHIC STUDIES: I have personally reviewed the radiological images as listed and agreed with the findings in the report. MM DIAG BREAST TOMO UNI LEFT  Result Date: 11/09/2022 CLINICAL DATA:  Six-month follow-up probably benign left breast calcifications. EXAM: DIGITAL DIAGNOSTIC UNILATERAL LEFT MAMMOGRAM WITH TOMOSYNTHESIS TECHNIQUE: Left digital diagnostic mammography and breast tomosynthesis was performed. COMPARISON:  Previous exam(s). ACR Breast Density Category c: The breasts are heterogeneously dense, which may obscure small masses.  FINDINGS: Subtle group of punctate calcifications in the lower outer left breast at posterior depth are mammographically stable. Otherwise, no new or suspicious findings identified. IMPRESSION: Stable, probably benign left breast calcifications. Recommend continued short-term follow-up. RECOMMENDATION: Bilateral diagnostic mammogram in 6 months. I have discussed the findings and recommendations with the patient.  If applicable, a reminder letter will be sent to the patient regarding the next appointment. BI-RADS CATEGORY  3: Probably benign. Electronically Signed   By: Sande Brothers M.D.   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   # Unfortunately CBC not drawn today.  Patient feels asymptomatic; wants to hold off any repeat blood work today.  # Given stability of her platelets, I think it is reasonable to space out the blood checks to every 3 months.  However if patient notices anything abnormal/easy bruising or nosebleeds.  She will call us sooner.  # DISPOSITION: # 3 month- labs-cbc # follow up in 6 months- MD; labs- cbc/bmp-- Dr.B  All questions were answered. The patient knows to call the clinic with any problems, questions or concerns.       Earna Coder, MD 11/18/2022 3:32 PM

## 2022-11-19 ENCOUNTER — Other Ambulatory Visit (HOSPITAL_COMMUNITY): Payer: Self-pay

## 2022-11-21 ENCOUNTER — Other Ambulatory Visit: Payer: Self-pay

## 2022-11-24 ENCOUNTER — Other Ambulatory Visit (HOSPITAL_COMMUNITY): Payer: Self-pay

## 2022-12-01 ENCOUNTER — Other Ambulatory Visit (HOSPITAL_COMMUNITY): Payer: Self-pay

## 2022-12-01 DIAGNOSIS — G4700 Insomnia, unspecified: Secondary | ICD-10-CM | POA: Diagnosis not present

## 2022-12-01 DIAGNOSIS — M7552 Bursitis of left shoulder: Secondary | ICD-10-CM | POA: Diagnosis not present

## 2022-12-01 DIAGNOSIS — G894 Chronic pain syndrome: Secondary | ICD-10-CM | POA: Diagnosis not present

## 2022-12-01 DIAGNOSIS — M25569 Pain in unspecified knee: Secondary | ICD-10-CM | POA: Diagnosis not present

## 2022-12-01 MED ORDER — TRAMADOL HCL 50 MG PO TABS
100.0000 mg | ORAL_TABLET | Freq: Four times a day (QID) | ORAL | 1 refills | Status: DC | PRN
  Filled 2022-12-01: qty 240, 30d supply, fill #0

## 2022-12-01 MED ORDER — BACLOFEN 10 MG PO TABS
10.0000 mg | ORAL_TABLET | Freq: Two times a day (BID) | ORAL | 3 refills | Status: DC | PRN
  Filled 2022-12-01: qty 60, 30d supply, fill #0

## 2022-12-01 MED ORDER — DULOXETINE HCL 60 MG PO CPEP
60.0000 mg | ORAL_CAPSULE | Freq: Every day | ORAL | 1 refills | Status: DC
  Filled 2022-12-01: qty 30, 30d supply, fill #0
  Filled 2022-12-26: qty 30, 30d supply, fill #1

## 2022-12-01 MED ORDER — MELOXICAM 15 MG PO TABS: 15.0000 mg | ORAL_TABLET | Freq: Every day | ORAL | 1 refills | Status: DC | PRN

## 2022-12-09 ENCOUNTER — Other Ambulatory Visit (HOSPITAL_COMMUNITY): Payer: Self-pay

## 2022-12-12 ENCOUNTER — Other Ambulatory Visit (HOSPITAL_COMMUNITY): Payer: Self-pay

## 2022-12-12 ENCOUNTER — Encounter (INDEPENDENT_AMBULATORY_CARE_PROVIDER_SITE_OTHER): Payer: Self-pay | Admitting: Physician Assistant

## 2022-12-12 ENCOUNTER — Ambulatory Visit (INDEPENDENT_AMBULATORY_CARE_PROVIDER_SITE_OTHER): Payer: Commercial Managed Care - PPO | Admitting: Physician Assistant

## 2022-12-12 VITALS — BP 148/80 | HR 77 | Temp 97.7°F | Ht 61.0 in | Wt 156.0 lb

## 2022-12-12 DIAGNOSIS — R5383 Other fatigue: Secondary | ICD-10-CM

## 2022-12-12 DIAGNOSIS — E669 Obesity, unspecified: Secondary | ICD-10-CM

## 2022-12-12 DIAGNOSIS — I10 Essential (primary) hypertension: Secondary | ICD-10-CM | POA: Diagnosis not present

## 2022-12-12 DIAGNOSIS — Z6829 Body mass index (BMI) 29.0-29.9, adult: Secondary | ICD-10-CM | POA: Diagnosis not present

## 2022-12-12 DIAGNOSIS — R7303 Prediabetes: Secondary | ICD-10-CM

## 2022-12-12 DIAGNOSIS — E559 Vitamin D deficiency, unspecified: Secondary | ICD-10-CM | POA: Diagnosis not present

## 2022-12-12 MED ORDER — METFORMIN HCL 500 MG PO TABS
500.0000 mg | ORAL_TABLET | Freq: Two times a day (BID) | ORAL | 0 refills | Status: DC
Start: 2022-12-12 — End: 2023-02-27
  Filled 2022-12-12: qty 180, 90d supply, fill #0

## 2022-12-12 NOTE — Progress Notes (Signed)
.[facility]  Office: [phone]  /  Fax: [phone]  WEIGHT SUMMARY AND BIOMETRICS  Vitals Temp: 97.7 F (36.5 C) BP: (!) 148/80 Pulse Rate: 77 SpO2: 97 %   Anthropometric Measurements Height: 5\' 1"  (1.549 m) Weight: 156 lb (70.8 kg) BMI (Calculated): 29.49 Weight at Last Visit: 152 lb Weight Lost Since Last Visit: 0 lb Weight Gained Since Last Visit: 4 lb Starting Weight: 198 lb   Body Composition  Body Fat %: 39.6 % Fat Mass (lbs): 62 lbs Muscle Mass (lbs): 90 lbs Total Body Water (lbs): 63.4 lbs Visceral Fat Rating : 10   Other Clinical Data Fasting: no Labs: no Today's Visit #: 54 Starting Date: 08/30/18     HPI  Chief Complaint: OBESITY  Katherine Tanner is here to discuss her progress with her obesity treatment plan. She is on the the Category 2 Plan and states she is following her eating plan approximately 85 % of the time. She states she is exercising/walking/bike/weights 30 minutes 4 times per week.   Interval History:  Since last office visit she is up 4 pounds/Total weight loss 42 lbs !!! She is just returned from a very enjoyable beach trip.  She is getting back on track with her nutrition plan after being on vacation last week. 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 She was informed we would discuss her lab results at her next visit unless there is a critical issue that needs to be addressed sooner. She agreed to keep her next visit at the agreed upon time to discuss these results.   Pharmacotherapy: Metformin 500 mg twice daily reports no GI side effects.  TREATMENT PLAN FOR OBESITY:  Recommended Dietary Goals  Katherine Tanner is currently in the action stage of change. As such, her goal is to continue weight management  plan. She has agreed to the Category 2 Plan.  Behavioral Intervention  We discussed the following Behavioral Modification Strategies today: increasing lean protein intake, decreasing simple carbohydrates , increasing vegetables, increasing lower glycemic fruits, increasing fiber rich foods, increasing water intake, emotional eating strategies and understanding the difference between hunger signals and cravings, continue to practice mindfulness when eating, and planning for success.  Additional resources provided today: NA  Recommended Physical Activity Goals  Katherine Tanner has been advised to work up to 150 minutes of moderate intensity aerobic activity a week and strengthening exercises 2-3 times per week for cardiovascular health, weight loss maintenance and preservation of muscle mass.   She has agreed to Continue current level of physical activity    Pharmacotherapy We discussed various medication options to help Katherine Tanner with her weight loss efforts and we both agreed to continue metformin 500 mg twice daily for primary indication of  prediabetes.    Return in about 6 weeks (around 01/23/2023).[name] She was informed of the importance of frequent follow up visits to maximize her success with intensive lifestyle modifications for her multiple health conditions.  PHYSICAL EXAM:  Blood pressure (!) 148/80, pulse 77, temperature 97.7 F (36.5 C), height 5\' 1"  (1.549 m), weight 156 lb (70.8 kg), last menstrual period 02/20/2014, SpO2 97 %. Body mass index is 29.48 kg/m.  General: She is overweight, cooperative, alert, well developed, and in no acute distress. PSYCH: Has normal mood, affect and thought process.   Cardiovascular: HR 70s, regular, BP 148/80  Lungs: Normal breathing effort, no conversational dyspnea.  DIAGNOSTIC DATA REVIEWED:  BMET    Component Value Date/Time   NA 139 11/18/2022 1447   NA 145 (H) 03/28/2022 1250   K 4.1 11/18/2022 1447   CL 101 11/18/2022 1447   CO2 29  11/18/2022 1447   GLUCOSE 83 11/18/2022 1447   BUN 19 11/18/2022 1447   BUN 13 03/28/2022 1250   CREATININE 0.60 11/18/2022 1447   CALCIUM 9.4 11/18/2022 1447   GFRNONAA >60 11/18/2022 1447   GFRAA 123 03/23/2020 1306   Lab Results  Component Value Date   HGBA1C 5.8 07/11/2022   HGBA1C 6.2 02/24/2014   Lab Results  Component Value Date   INSULIN 6.9 09/12/2022   INSULIN 9.3 08/30/2018   Lab Results  Component Value Date   TSH 0.61 07/11/2022   CBC    Component Value Date/Time   WBC 5.9 11/18/2022 1447   RBC 4.18 11/18/2022 1447   HGB 12.2 11/18/2022 1447   HGB 13.3 11/25/2020 0921   HGB 12.9 12/24/2019 1627   HCT 38.0 11/18/2022 1447   HCT 40.2 12/24/2019 1627   PLT 148 (L) 11/18/2022 1447   PLT 154 11/25/2020 0921   PLT 135 (L) 12/24/2019 1627   MCV 90.9 11/18/2022 1447   MCV 87 12/24/2019 1627   MCH 29.2 11/18/2022 1447   MCHC 32.1 11/18/2022 1447   RDW 13.9 11/18/2022 1447   RDW 13.2 12/24/2019 1627   Iron Studies    Component Value Date/Time   IRON 63 07/07/2020 1334   Lipid Panel     Component Value Date/Time   CHOL 142 07/11/2022 0754   CHOL 159 03/22/2021 1237   TRIG 32.0 07/11/2022 0754   HDL 64.10 07/11/2022 0754   HDL 68 03/22/2021 1237   CHOLHDL 2 07/11/2022 0754   VLDL 6.4 07/11/2022 0754   LDLCALC 71 07/11/2022 0754   LDLCALC 81 03/22/2021 1237   LDLDIRECT 147.9 12/17/2012 1604   Hepatic Function Panel     Component Value Date/Time   PROT 6.6 07/11/2022 0754   PROT 7.1 03/28/2022 1250   ALBUMIN 4.1 07/11/2022 0754   ALBUMIN 4.6 03/28/2022 1250   AST 23 07/11/2022 0754   ALT 27 07/11/2022 0754   ALKPHOS 88 07/11/2022 0754   BILITOT 0.4 07/11/2022 0754   BILITOT 0.3 03/28/2022 1250   BILIDIR 0.1 01/02/2012 1626   IBILI 0.3 01/05/2010 2038      Component Value Date/Time   TSH 0.61 07/11/2022 0754   Nutritional Lab Results  Component Value Date   VD25OH 42.04 07/11/2022   VD25OH 45.1 03/28/2022   VD25OH 67.1 12/20/2021     ASSOCIATED CONDITIONS ADDRESSED TODAY  ASSESSMENT AND PLAN  Problem List Items Addressed This Visit     Essential hypertension - Primary   Prediabetes   Relevant Medications   metFORMIN (GLUCOPHAGE) 500 MG tablet   Other Relevant Orders   CMP14+EGFR   Hemoglobin A1c   Insulin, random   Lipid Panel With LDL/HDL Ratio   Vitamin D deficiency   Relevant Orders   VITAMIN D 25 Hydroxy (Vit-D Deficiency, Fractures)   Generalized obesity   Relevant Medications   metFORMIN (GLUCOPHAGE) 500 MG tablet   Other Visit Diagnoses     Other fatigue       Relevant Orders   Vitamin B12   TSH     Prediabetes Last A1c was 5.8/ Insulin 6.9- nearing goals, but not optimized.   Medication(s): Metformin 500 mg twice daily with  meals No GI side effects with metformin reported today.  Polyphagia:No She is working on nutrition plan to decrease simple carbohydrates, increase lean proteins and exercise to promote weight loss, improve glycemic control and prevent progression to Type 2 diabetes.   Lab Results  Component Value Date   HGBA1C 5.8 07/11/2022   HGBA1C 5.6 03/28/2022   HGBA1C 5.6 12/20/2021   HGBA1C 5.6 09/28/2021   HGBA1C 5.8 07/09/2021   Lab Results  Component Value Date   INSULIN 6.9 09/12/2022   INSULIN 6.7 03/28/2022   INSULIN 10.0 12/20/2021   INSULIN 3.7 09/28/2021   INSULIN 7.0 06/24/2021    Plan: Continue and refill Metformin 500 mg twice daily with meals Continue working on nutrition plan to decrease simple carbohydrates, increase lean proteins and exercise to promote weight loss, improve glycemic control and prevent progression to Type 2 diabetes.  Recheck fasting labs today.   Hypertension Hypertension asymptomatic, no significant medication side effects noted, borderline controlled, needs further observation, and needs improvement.  Medication(s): losartan- HCTZ 100-25 mg daily Renal function normal.   BP Readings from Last 3 Encounters:  12/12/22 (!)  148/80  11/18/22 134/86  10/24/22 (!) 147/83   Lab Results  Component Value Date   CREATININE 0.60 11/18/2022   CREATININE 0.58 07/11/2022   CREATININE 0.58 05/20/2022   Lab Results  Component Value Date   GFR 98.75 07/11/2022   GFR 100.29 07/09/2021   GFR 99.74 07/07/2020    Plan: Continue all antihypertensives at current dosages. She is planning to see Dr. Milinda Antis concerning recent BP elevations. She is going to monitor and record BP at home and take in when she sees Dr. Milinda Antis.  Continue to work on nutrition plan to promote weight loss and improve BP control.    Vitamin D Deficiency Vitamin D is not at goal of 50.  Most recent vitamin D level was 42.04- stable. She is on OTC vitamin D3 1000 IU daily. Lab Results  Component Value Date   VD25OH 42.04 07/11/2022   VD25OH 45.1 03/28/2022   VD25OH 67.1 12/20/2021    Plan: Continue OTC vitamin D3 1000 IU daily Low vitamin D levels can be associated with adiposity and may result in leptin resistance and weight gain. Also associated with fatigue. Currently on vitamin D supplementation without any adverse effects.  Recheck Vitamin D levels today.   Fatigue:  Endorses fatigue. On vitamin D supplementation and B 12 rich nutrition plan.  Plan: Recheck labs today including CBC, B12, vitamin D and TSH. . Supplement accordingly.   ATTESTASTION STATEMENTS:  Reviewed by clinician on day of visit: allergies, medications, problem list, medical history, surgical history, family history, social history, and previous encounter notes.   I have personally spent 39 minutes total time today in preparation, patient care, nutritional counseling and documentation for this visit, including the following: review of clinical lab tests; review of medical tests/procedures/services.      Katherine College, PA-C

## 2022-12-13 LAB — CMP14+EGFR
ALT: 35 IU/L — ABNORMAL HIGH (ref 0–32)
AST: 27 IU/L (ref 0–40)
Albumin/Globulin Ratio: 1.8
Albumin: 4.5 g/dL (ref 3.8–4.9)
Alkaline Phosphatase: 118 IU/L (ref 44–121)
BUN/Creatinine Ratio: 29 — ABNORMAL HIGH (ref 12–28)
BUN: 18 mg/dL (ref 8–27)
Bilirubin Total: 0.5 mg/dL (ref 0.0–1.2)
CO2: 27 mmol/L (ref 20–29)
Calcium: 9.9 mg/dL (ref 8.7–10.3)
Chloride: 100 mmol/L (ref 96–106)
Creatinine, Ser: 0.63 mg/dL (ref 0.57–1.00)
Globulin, Total: 2.5 g/dL (ref 1.5–4.5)
Glucose: 98 mg/dL (ref 70–99)
Potassium: 4.2 mmol/L (ref 3.5–5.2)
Sodium: 141 mmol/L (ref 134–144)
Total Protein: 7 g/dL (ref 6.0–8.5)
eGFR: 101 mL/min/{1.73_m2} (ref >59)

## 2022-12-13 LAB — LIPID PANEL WITH LDL/HDL RATIO
Cholesterol, Total: 170 mg/dL (ref 100–199)
HDL: 76 mg/dL (ref >39)
LDL Chol Calc (NIH): 85 mg/dL (ref 0–99)
LDL/HDL Ratio: 1.1 ratio (ref 0.0–3.2)
Triglycerides: 42 mg/dL (ref 0–149)
VLDL Cholesterol Cal: 9 mg/dL (ref 5–40)

## 2022-12-13 LAB — TSH: TSH: 0.931 u[IU]/mL (ref 0.450–4.500)

## 2022-12-13 LAB — HEMOGLOBIN A1C
Est. average glucose Bld gHb Est-mCnc: 114 mg/dL
Hgb A1c MFr Bld: 5.6 % (ref 4.8–5.6)

## 2022-12-13 LAB — VITAMIN B12: Vitamin B-12: >2000 pg/mL — ABNORMAL HIGH (ref 232–1245)

## 2022-12-13 LAB — VITAMIN D 25 HYDROXY (VIT D DEFICIENCY, FRACTURES): Vit D, 25-Hydroxy: 74 ng/mL (ref 30.0–100.0)

## 2022-12-13 LAB — INSULIN, RANDOM: INSULIN: 7.5 u[IU]/mL (ref 2.6–24.9)

## 2022-12-15 ENCOUNTER — Other Ambulatory Visit: Payer: Self-pay

## 2022-12-16 ENCOUNTER — Other Ambulatory Visit (HOSPITAL_COMMUNITY): Payer: Self-pay

## 2022-12-19 ENCOUNTER — Ambulatory Visit: Payer: Commercial Managed Care - PPO | Admitting: Family Medicine

## 2022-12-19 ENCOUNTER — Encounter: Payer: Self-pay | Admitting: Family Medicine

## 2022-12-19 VITALS — BP 124/66 | HR 82 | Temp 97.9°F | Ht 61.0 in | Wt 160.0 lb

## 2022-12-19 DIAGNOSIS — R930 Abnormal findings on diagnostic imaging of skull and head, not elsewhere classified: Secondary | ICD-10-CM

## 2022-12-19 DIAGNOSIS — R519 Headache, unspecified: Secondary | ICD-10-CM | POA: Insufficient documentation

## 2022-12-19 DIAGNOSIS — G4489 Other headache syndrome: Secondary | ICD-10-CM | POA: Diagnosis not present

## 2022-12-19 DIAGNOSIS — I10 Essential (primary) hypertension: Secondary | ICD-10-CM

## 2022-12-19 DIAGNOSIS — R2689 Other abnormalities of gait and mobility: Secondary | ICD-10-CM

## 2022-12-19 LAB — SEDIMENTATION RATE: Sed Rate: 16 mm/hr (ref 0–30)

## 2022-12-19 LAB — CBC WITH DIFFERENTIAL/PLATELET
Basophils Absolute: 0 10*3/uL (ref 0.0–0.1)
Basophils Relative: 1 % (ref 0.0–3.0)
Eosinophils Absolute: 0.1 10*3/uL (ref 0.0–0.7)
Eosinophils Relative: 2.9 % (ref 0.0–5.0)
HCT: 40.5 % (ref 36.0–46.0)
Hemoglobin: 13.2 g/dL (ref 12.0–15.0)
Lymphocytes Relative: 33.6 % (ref 12.0–46.0)
Lymphs Abs: 1.5 10*3/uL (ref 0.7–4.0)
MCHC: 32.5 g/dL (ref 30.0–36.0)
MCV: 89.4 fl (ref 78.0–100.0)
Monocytes Absolute: 0.6 10*3/uL (ref 0.1–1.0)
Monocytes Relative: 12.2 % — ABNORMAL HIGH (ref 3.0–12.0)
Neutro Abs: 2.3 10*3/uL (ref 1.4–7.7)
Neutrophils Relative %: 50.3 % (ref 43.0–77.0)
Platelets: 116 10*3/uL — ABNORMAL LOW (ref 150.0–400.0)
RBC: 4.53 Mil/uL (ref 3.87–5.11)
RDW: 14.3 % (ref 11.5–15.5)
WBC: 4.6 10*3/uL (ref 4.0–10.5)

## 2022-12-19 LAB — C-REACTIVE PROTEIN: CRP: <1 mg/dL (ref 0.5–20.0)

## 2022-12-19 NOTE — Progress Notes (Signed)
Subjective:    Patient ID: Katherine Tanner, female    DOB: Dec 11, 1960, 62 y.o.   MRN: [id number]  HPI Pt presents for c/o headaches   Wt Readings from Last 3 Encounters:  12/19/22 160 lb (72.6 kg)  12/12/22 156 lb (70.8 kg)  11/18/22 161 lb 11.2 oz (73.3 kg)   30.23 kg/m  Vitals:   12/19/22 1011  BP: 124/66  Pulse: 82  Temp: 97.9 F (36.6 C)  SpO2: 98%     Has been having headaches off and on for couple months and has now noticed an increase in frequency-headaches weekly and has felt off balance recently    Was helping daughter move  On June 1 - woke up with a very bad headache (has never been this bad)  Husband notes balance is not good   Not dizzy   No vision change  No neuro changes or weakness  Headaches vary in location - always bilateral Throbbing in nature , dull pain  Had nausea with one headache-did vomit  Light sensitivity    Had migraines in the past- years ago (over 20 years ago)   Now having headaches at least once weekly  Some of them are mild    Meloxicam -takes when needed / maybe 1-2 times per week for joints Tramadol daily for joints   Over the counter  Tylenol as needed  Bought excedrin migraine-took it yesterday  Drinks 1 cup of coffee per day  Drinks 64 oz of water per day with weight management   No big life changes lately  Meds are not new   Goes to bed at 8 , gets up at 2:40 am for work  Sometimes takes a nap    Goes to [facility]    bp is stable today  No cp or palpitations or headaches or edema  No side effects to medicines  BP Readings from Last 3 Encounters:  12/19/22 124/66  12/12/22 (!) 148/80  11/18/22 134/86    Pulse Readings from Last 3 Encounters:  12/19/22 82  12/12/22 77  11/18/22 81     Takes hydrochlorothiazide 100-25 mg for HTN   Cymbalta for chronic pain syndrome   Most recent labs CMP     Component Value Date/Time   NA 141 12/12/2022 1139   K 4.2 12/12/2022  1139   CL 100 12/12/2022 1139   CO2 27 12/12/2022 1139   GLUCOSE 98 12/12/2022 1139   GLUCOSE 83 11/18/2022 1447   BUN 18 12/12/2022 1139   CREATININE 0.63 12/12/2022 1139   CALCIUM 9.9 12/12/2022 1139   PROT 7.0 12/12/2022 1139   ALBUMIN 4.5 12/12/2022 1139   AST 27 12/12/2022 1139   ALT 35 (H) 12/12/2022 1139   ALKPHOS 118 12/12/2022 1139   BILITOT 0.5 12/12/2022 1139   GFR 98.75 07/11/2022 0754   EGFR 101 12/12/2022 1139   GFRNONAA >60 11/18/2022 1447     Patient Active Problem List   Diagnosis Date Noted   Headache 12/19/2022   Menopause 07/25/2022   Generalized obesity 07/25/2022   Chronic pain syndrome 02/09/2022   Left ankle pain 01/12/2021   BMI 28.0-28.9,adult 08/17/2020   At risk for diabetes mellitus 07/27/2020   Nonalcoholic hepatosteatosis 07/06/2020   At risk for heart disease 07/06/2020   Transaminitis 04/07/2020   Thrombocytopenia (HCC) 07/08/2019   Insulin resistance 06/19/2019   Vitamin D deficiency 11/05/2018   Obstructive sleep apnea 10/10/2017   Primary osteoarthritis of left knee 12/30/2016   BMI  30.0-30.9,adult 05/24/2016   Prediabetes 02/24/2014   Osteoarthritis of right knee 11/15/2013   Osteoarthritis of left knee 11/15/2013   Alkaline phosphatase elevation 01/02/2012   Hyperlipidemia 01/02/2012   Routine general medical examination at a health care facility 12/09/2011   Allergy to influenza vaccine 05/02/2011   POLYARTHRITIS 01/28/2010   Chronic pain of both knees 12/11/2009   ANGIOEDEMA 03/27/2007   Essential hypertension 03/19/2007   Perennial allergic rhinitis with seasonal variation 03/19/2007   Asthma, moderate persistent 03/19/2007   GERD 03/19/2007   HIATAL HERNIA 03/19/2007   GESTATIONAL DIABETES 03/19/2007   Past Medical History:  Diagnosis Date   Allergy    takes Claritin daily and Flonase daily   Arthritis    Asthma    uses Albuterol daily as needed;Singulair nightly;DUlera daily   Back pain    Constipation    Fatty  liver    Food allergy    GERD (gastroesophageal reflux disease)    occasionally will take OTC meds but states she just watches what she eats   H/O hiatal hernia    History of bronchitis    last time 91yrs ago   History of colon polyps    History of kidney stones    History of shingles    Hyperlipidemia    Hypertension    takes Hyzaar daily   Joint pain    Joint pain    Joint swelling    Obesity    Osteoarthritis    Pneumonia    hx of;last time at least 37yrs ago   PONV (postoperative nausea and vomiting)    Prediabetes    Shortness of breath    Sleep apnea    Swelling of lower extremity    Trouble in sleeping    Wheezing    Past Surgical History:  Procedure Laterality Date   BREAST EXCISIONAL BIOPSY Right    CARDIAC CATHETERIZATION     COLONOSCOPY     ESOPHAGOGASTRODUODENOSCOPY     KNEE SURGERY Right    arthroscopy   Right ganglion cyst     x 2   right lumpectomy  around 1983   STERIOD INJECTION Left 11/15/2013   Procedure: Marcaine/STEROID INJECTION;  Surgeon: Harvie Junior, MD;  Location: [facility] OR;  Service: Orthopedics;  Laterality: Left;   TOTAL KNEE ARTHROPLASTY Right 11/15/2013   Procedure: RIGHT TOTAL KNEE ARTHROPLASTY;  Surgeon: Harvie Junior, MD;  Location: [facility] OR;  Service: Orthopedics;  Laterality: Right;   TOTAL KNEE ARTHROPLASTY Left 12/30/2016   Procedure: TOTAL KNEE ARTHROPLASTY;  Surgeon: Jodi Geralds, MD;  Location: [facility] OR;  Service: Orthopedics;  Laterality: Left;   TUBAL LIGATION     Social History   Tobacco Use   Smoking status: Never   Smokeless tobacco: Never  Vaping Use   Vaping Use: Never used  Substance Use Topics   Alcohol use: No    Alcohol/week: 0.0 standard drinks of alcohol   Drug use: No   Family History  Problem Relation Age of Onset   Diabetes Father    Cancer Father    Liver disease Father    Alcoholism Father    Sarcoidosis Mother    Glaucoma Mother    Hypertension Mother    Hyperlipidemia Mother    Sleep apnea Mother     Obesity Mother    Breast cancer Neg Hx    Allergies  Allergen Reactions   Ace Inhibitors Shortness Of Breath and Cough    Cough/ breathing problems    Amoxicillin-Pot  Clavulanate Swelling    SWELLING REACTION UNSPECIFIED    Influenza Virus Vacc Split Pf     UNSPECIFIED REACTION    Shellfish Allergy Itching   Current Outpatient Medications on File Prior to Visit  Medication Sig Dispense Refill   albuterol (VENTOLIN HFA) 108 (90 Base) MCG/ACT inhaler Inhale 2 puffs into the lungs every 6 (six) hours as needed for wheezing or shortness of breath 6.7 g 12   baclofen (LIORESAL) 10 MG tablet Take 1 tablet (10 mg total) by mouth 2 (two) times daily as needed. 60 tablet 3   Calcium Citrate-Vitamin D (CALCIUM + D PO) Take 1 tablet by mouth daily.     Cholecalciferol (VITAMIN D3) 25 MCG (1000 UT) CAPS Take 1 capsule (1,000 Units total) by mouth daily. 30 capsule 0   diclofenac Sodium (VOLTAREN) 1 % GEL Apply 4 grams to skin four times a day 960 g 5   DULoxetine (CYMBALTA) 60 MG capsule Take 1 capsule (60 mg total) by mouth at bedtime. 30 capsule 1   fluticasone-salmeterol (ADVAIR DISKUS) 250-50 MCG/ACT AEPB Inhale 1 puff into the lungs 2 times daily then rinse mouth 60 each 10   ipratropium-albuterol (DUONEB) 0.5-2.5 (3) MG/3ML SOLN Inhale 1 vial via nebulizer every 6 (six) hours as needed. 360 mL 12   loratadine (CLARITIN) 10 MG tablet Take 10 mg by mouth daily.     losartan-hydrochlorothiazide (HYZAAR) 100-25 MG tablet TAKE 1 TABLET BY MOUTH ONCE DAILY 90 tablet 3   meloxicam (MOBIC) 15 MG tablet Take 1 tablet (15 mg) by mouth daily as needed for pain. 30 tablet 1   metFORMIN (GLUCOPHAGE) 500 MG tablet Take 1 tablet by mouth 2 times daily with meals. 180 tablet 0   montelukast (SINGULAIR) 10 MG tablet Take 1 tablet (10 mg total) by mouth at bedtime. 30 tablet 11   Multiple Vitamin (MULTIVITAMIN PO) Take 1 tablet by mouth daily.     Nebulizers (COMPRESSOR/NEBULIZER) MISC Use as directed 1 each 0    rosuvastatin (CRESTOR) 10 MG tablet Take 1 tablet (10 mg total) by mouth daily. 90 tablet 3   traMADol (ULTRAM) 50 MG tablet Take 2 tablets (100 mg total) by mouth 4 (four) times daily as needed for pain. 240 tablet 1   EPINEPHRINE 0.3 mg/0.3 mL IJ SOAJ injection INJECT 1 SYRINGE INTO THE MUSCLE ONCE (Patient not taking: Reported on 12/19/2022) 1 each 1   No current facility-administered medications on file prior to visit.    Review of Systems  Constitutional:  Positive for fatigue. Negative for activity change, appetite change, fever and unexpected weight change.  HENT:  Negative for congestion, ear pain, rhinorrhea, sinus pressure and sore throat.   Eyes:  Negative for pain, redness and visual disturbance.  Respiratory:  Negative for cough, shortness of breath and wheezing.   Cardiovascular:  Negative for chest pain and palpitations.  Gastrointestinal:  Negative for abdominal pain, blood in stool, constipation and diarrhea.  Endocrine: Negative for polydipsia and polyuria.  Genitourinary:  Negative for dysuria, frequency and urgency.  Musculoskeletal:  Negative for arthralgias, back pain and myalgias.  Skin:  Negative for pallor and rash.  Allergic/Immunologic: Negative for environmental allergies.  Neurological:  Positive for headaches. Negative for dizziness, syncope, facial asymmetry, light-headedness and numbness.       Worse balance   Hematological:  Negative for adenopathy. Does not bruise/bleed easily.  Psychiatric/Behavioral:  Negative for decreased concentration and dysphoric mood. The patient is not nervous/anxious.  Objective:   Physical Exam Constitutional:      General: She is not in acute distress.    Appearance: She is well-developed. She is obese.  HENT:     Head: Normocephalic and atraumatic.     Comments: Some temporal tenderness worse on R   No sinus tenderness    Right Ear: External ear normal.     Left Ear: External ear normal.     Nose: Nose  normal.     Mouth/Throat:     Mouth: Mucous membranes are moist.     Pharynx: No oropharyngeal exudate.  Eyes:     General: No scleral icterus.       Right eye: No discharge.        Left eye: No discharge.     Conjunctiva/sclera: Conjunctivae normal.     Pupils: Pupils are equal, round, and reactive to light.     Comments: No nystagmus  Neck:     Thyroid: No thyromegaly.     Vascular: No carotid bruit or JVD.     Trachea: No tracheal deviation.  Cardiovascular:     Rate and Rhythm: Normal rate and regular rhythm.     Heart sounds: Normal heart sounds. No murmur heard. Pulmonary:     Effort: Pulmonary effort is normal. No respiratory distress.     Breath sounds: Normal breath sounds. No stridor. No wheezing, rhonchi or rales.  Abdominal:     General: Bowel sounds are normal. There is no distension.     Palpations: Abdomen is soft. There is no mass.     Tenderness: There is no abdominal tenderness.  Musculoskeletal:        General: No tenderness.     Cervical back: Full passive range of motion without pain, normal range of motion and neck supple.  Lymphadenopathy:     Cervical: No cervical adenopathy.  Skin:    General: Skin is warm and dry.     Coloration: Skin is not pale.     Findings: No rash.  Neurological:     Mental Status: She is alert and oriented to person, place, and time.     Cranial Nerves: Cranial nerves 2-12 are intact. No cranial nerve deficit, dysarthria or facial asymmetry.     Sensory: Sensation is intact. No sensory deficit.     Motor: No weakness, tremor, atrophy, abnormal muscle tone or pronator drift.     Coordination: Coordination is intact. Romberg sign negative. Coordination normal. Finger-Nose-Finger Test normal.     Gait: Tandem walk abnormal. Gait normal.     Deep Tendon Reflexes: Reflexes are normal and symmetric.     Comments: No focal cerebellar signs   Pt does struggle with tandem gait   Psychiatric:        Behavior: Behavior normal.         Thought Content: Thought content normal.           Assessment & Plan:   Problem List Items Addressed This Visit       Cardiovascular and Mediastinum   Essential hypertension    bp in fair control at this time  BP Readings from Last 1 Encounters:  12/19/22 124/66  No changes needed Most recent labs reviewed  Disc lifstyle change with low sodium diet and exercise  Plan to continue losartan hct 100-25 mg 1/2 pill daily        Other   Headache - Primary    In pt with past migraines (years ago)  Goes to  a pain clinic and takes chronic tramadol and some nsaid for chronic joint pain  Headaches returned about 2 mo ago (only other symptom is worse balance)  Unsure if trigger  Throbbing/ nausea / usually bilateral but worse on R at times with scalp tenderness Some temporal tenderness today and problems with tandem gait   Will check lab for TA today  Consider imaging (MRI if covered, if not start with CT) Discussed habits for chronic headache/ limit cafffine, stay hydrated, keep sleep habits the same  ER precautions noted in detail      Relevant Orders   Sedimentation Rate   C-reactive protein   CBC with Differential/Platelet

## 2022-12-19 NOTE — Assessment & Plan Note (Addendum)
In pt with past migraines (years ago)  Goes to a pain clinic and takes chronic tramadol and some nsaid for chronic joint pain  Headaches returned about 2 mo ago (only other symptom is worse balance)  Unsure if trigger  Throbbing/ nausea / usually bilateral but worse on R at times with scalp tenderness Some temporal tenderness today and problems with tandem gait   Will check lab for TA today  Consider imaging (MRI if covered, if not start with CT) Discussed habits for chronic headache/ limit cafffine, stay hydrated, keep sleep habits the same  ER precautions noted in detail

## 2022-12-19 NOTE — Assessment & Plan Note (Signed)
bp in fair control at this time  BP Readings from Last 1 Encounters:  12/19/22 124/66   No changes needed Most recent labs reviewed  Disc lifstyle change with low sodium diet and exercise  Plan to continue losartan hct 100-25 mg 1/2 pill daily

## 2022-12-19 NOTE — Patient Instructions (Addendum)
Avoid excessive caffeine  Caffeine can help a headache   Keep up the fluid intake  Try and keep your sleep and wake times the same every day   Use cold compress for headach  If severe headache- go to the ER    Lab today  Once results are back I will likely order an imaging test (aware if you prefer [facility])   Take care of yourself  Keep [name] posted   A headache journal is helpful

## 2022-12-20 DIAGNOSIS — R2689 Other abnormalities of gait and mobility: Secondary | ICD-10-CM | POA: Insufficient documentation

## 2022-12-22 ENCOUNTER — Ambulatory Visit
Admission: RE | Admit: 2022-12-22 | Discharge: 2022-12-22 | Disposition: A | Discharge status: Home / Self Care | Payer: Commercial Managed Care - PPO | Source: Ambulatory Visit | Attending: Family Medicine | Admitting: Family Medicine

## 2022-12-22 ENCOUNTER — Other Ambulatory Visit (HOSPITAL_COMMUNITY): Payer: Self-pay

## 2022-12-22 DIAGNOSIS — G4489 Other headache syndrome: Secondary | ICD-10-CM

## 2022-12-22 DIAGNOSIS — R2681 Unsteadiness on feet: Secondary | ICD-10-CM | POA: Diagnosis not present

## 2022-12-22 DIAGNOSIS — R930 Abnormal findings on diagnostic imaging of skull and head, not elsewhere classified: Secondary | ICD-10-CM | POA: Insufficient documentation

## 2022-12-22 DIAGNOSIS — R519 Headache, unspecified: Secondary | ICD-10-CM | POA: Diagnosis not present

## 2022-12-22 DIAGNOSIS — R2689 Other abnormalities of gait and mobility: Secondary | ICD-10-CM

## 2022-12-22 MED ORDER — GADOPICLENOL 0.5 MMOL/ML IV SOLN
7.0000 mL | Freq: Once | INTRAVENOUS | Status: AC | PRN
  Administered 2022-12-22: 7 mL via INTRAVENOUS

## 2022-12-22 MED ORDER — GABAPENTIN 100 MG PO CAPS
100.0000 mg | ORAL_CAPSULE | Freq: Three times a day (TID) | ORAL | 3 refills | Status: DC
  Filled 2022-12-22: qty 90, 30d supply, fill #0
  Filled 2023-01-23: qty 90, 30d supply, fill #1
  Filled 2023-02-17: qty 90, 30d supply, fill #2
  Filled 2023-03-20: qty 90, 30d supply, fill #3

## 2022-12-22 NOTE — Addendum Note (Signed)
Addended by: Roxy Manns A on: 12/22/2022 04:31 PM   Modules accepted: Orders

## 2022-12-22 NOTE — Addendum Note (Signed)
Addended by: Roxy Manns A on: 12/22/2022 05:07 PM   Modules accepted: Orders

## 2022-12-23 ENCOUNTER — Other Ambulatory Visit (HOSPITAL_COMMUNITY): Payer: Self-pay

## 2022-12-27 ENCOUNTER — Encounter: Payer: Self-pay | Admitting: Family Medicine

## 2022-12-27 NOTE — Telephone Encounter (Signed)
Please schedule in office visit to discuss this and send for last 2 pain management clinic visits for me to review  We will discuss it and see if I can help or not

## 2022-12-28 ENCOUNTER — Other Ambulatory Visit (HOSPITAL_COMMUNITY): Payer: Self-pay

## 2022-12-28 NOTE — Telephone Encounter (Signed)
Appt scheduled, last 2 office notes requested.

## 2023-01-02 ENCOUNTER — Encounter: Payer: Self-pay | Admitting: Family Medicine

## 2023-01-02 ENCOUNTER — Ambulatory Visit: Payer: Commercial Managed Care - PPO | Admitting: Family Medicine

## 2023-01-02 VITALS — BP 128/80 | HR 82 | Temp 97.7°F | Ht 61.0 in | Wt 157.4 lb

## 2023-01-02 DIAGNOSIS — M545 Low back pain, unspecified: Secondary | ICD-10-CM

## 2023-01-02 DIAGNOSIS — M25561 Pain in right knee: Secondary | ICD-10-CM | POA: Diagnosis not present

## 2023-01-02 DIAGNOSIS — G8929 Other chronic pain: Secondary | ICD-10-CM

## 2023-01-02 DIAGNOSIS — M25562 Pain in left knee: Secondary | ICD-10-CM | POA: Diagnosis not present

## 2023-01-02 DIAGNOSIS — G894 Chronic pain syndrome: Secondary | ICD-10-CM | POA: Diagnosis not present

## 2023-01-02 NOTE — Patient Instructions (Signed)
We will take over tramadol and baclofen and meloxicam  If you can get by without baclofen then hold it   Only use meloxicam when absolutely necessary   Keep stretching/ exercising/ doing what you can   Remember ice and heat  Ice-knees Heat- back   When you need refill let us know

## 2023-01-02 NOTE — Telephone Encounter (Signed)
Pt wanted to make sure you include the cymbalta on the meds that you are taking over. It wasn't listed on her check out papers

## 2023-01-02 NOTE — Assessment & Plan Note (Addendum)
Midline / lumbar due to facet deg change and also small discussed bulge L5-S1 Reviewed last mri and LS  Reviewed MRI report from 2023 Pending physical med and rehab notes from Dr Manon Hilding  Reviewed state database for prescription med   She takes  Tramadol 120 per month  Baclofen 30 per month Meloxicam usually 8-10 per week (caution in light of platelets)   We are taking over pain management since her ins no longer pays for the clinic  Also for knee pain Contract signed No refills needed per day  Encouraged her to continue her stretches and PT exercises

## 2023-01-02 NOTE — Assessment & Plan Note (Signed)
Knees/ back= OA  Did not improve much after knee surgery   Takes: Tramadol 120 per month Baclofen 30 per month Meloxicam usually 8-10 per week (caution in light of platelets)   We are taking over for pain clinic (no longer covered)  Records reviewed / still pending some   Pain contract signed

## 2023-01-02 NOTE — Progress Notes (Signed)
Subjective:    Patient ID: Katherine Tanner, female    DOB: 1961/03/02, 62 y.o.   MRN: [id number]  HPI  Wt Readings from Last 3 Encounters:  01/02/23 157 lb 6 oz (71.4 kg)  12/19/22 160 lb (72.6 kg)  12/12/22 156 lb (70.8 kg)   29.74 kg/m  Vitals:   01/02/23 0851  BP: 128/80  Pulse: 82  Temp: 97.7 F (36.5 C)  SpO2: 98%   Pt presents for encounter for chronic pain control We ware taking over her pain medicine from [facility]   Pt was going to a pain management clinic  Insurance no longer covers that clinic  Knee pain : has had both knees replaced -  2015 and 2018  Her orthopedic said there was not more they could do  Sent her to neurology - who sent to pain management  Weight loss did help but just a little bit   Does warm ups and stretches and still does her PT exercises   Chronic back pain/ lumbar - middle  Per pt has arthritis - [facility]  Had an ultrasound guided injection- did not help  Pain does not radiate down leg   Xray 2021- scoliosis  MRI 03/2022  Narrative & Impression  CLINICAL DATA:  Low back pain radiating into bilateral buttocks   EXAM: MRI LUMBAR SPINE WITHOUT CONTRAST   TECHNIQUE: Multiplanar, multisequence MR imaging of the lumbar spine was performed. No intravenous contrast was administered.   COMPARISON:  07/12/2020   FINDINGS: Segmentation:  Standard.   Alignment: Anteroposterior alignment is maintained. Mild levocurvature.   Vertebrae: Vertebral body heights are maintained. No marrow edema. No suspicious osseous lesion.   Conus medullaris and cauda equina: Conus extends to the L1-L2 level. Conus and cauda equina appear normal.   Paraspinal and other soft tissues: Possible fibroid uterus on the sagittal series.   Disc levels:   L1-L2:  No canal or foraminal stenosis.   L2-L3:  No canal or foraminal stenosis.   L3-L4:  Mild facet arthropathy.  No canal or foraminal stenosis.   L4-L5: Mild to moderate  facet arthropathy with ligamentum flavum infolding. No canal or foraminal stenosis.   L5-S1: Disc bulge. Mild to moderate facet arthropathy. Prominent epidural fat partially effaces the thecal sac. No foraminal stenosis.   IMPRESSION: Mild, facet predominant degenerative changes as detailed above are similar to the prior study. No significant stenosis.   Possible fibroid uterus. This could be further evaluated with ultrasound.    Last gabapentin was filled 12/22/22 90 pills- 30 day supply This is for migraines but helps with her back pain   Tramadol refilled 12/15/22 240 pills which is 60 day supply from [facility]  1 in am , 1 around 11 and 2 at bedtime  4 pills daily  Does not need a refill yet   Baclofen 10 mg at bedtime   State database noted today -not red flags    Meloxicam 15 mg daily prn (affects platelets so takes only when abs necessary)  Avg 2 times per week   Lab Results  Component Value Date   WBC 4.6 12/19/2022   HGB 13.2 12/19/2022   HCT 40.5 12/19/2022   MCV 89.4 12/19/2022   PLT 116.0 (L) 12/19/2022   Does not need refills of anything today    Has been on medicine every since joint replacement  On tramadol ever since 2018 -never came off of it   Tolerates these medicines well Sleeps well with this  combo   Patient Active Problem List   Diagnosis Date Noted   Abnormal MRI of head 12/22/2022   Loss of balance 12/20/2022   Headache 12/19/2022   Menopause 07/25/2022   Generalized obesity 07/25/2022   Chronic pain syndrome 02/09/2022   Left ankle pain 01/12/2021   BMI 28.0-28.9,adult 08/17/2020   At risk for diabetes mellitus 07/27/2020   Nonalcoholic hepatosteatosis 07/06/2020   At risk for heart disease 07/06/2020   Transaminitis 04/07/2020   Thrombocytopenia (HCC) 07/08/2019   Insulin resistance 06/19/2019   Vitamin D deficiency 11/05/2018   Obstructive sleep apnea 10/10/2017   Primary osteoarthritis of left knee 12/30/2016   BMI  30.0-30.9,adult 05/24/2016   Prediabetes 02/24/2014   Osteoarthritis of right knee 11/15/2013   Osteoarthritis of left knee 11/15/2013   Alkaline phosphatase elevation 01/02/2012   Hyperlipidemia 01/02/2012   Routine general medical examination at a health care facility 12/09/2011   Allergy to influenza vaccine 05/02/2011   Chronic low back pain 03/08/2011   POLYARTHRITIS 01/28/2010   Chronic pain of both knees 12/11/2009   ANGIOEDEMA 03/27/2007   Essential hypertension 03/19/2007   Perennial allergic rhinitis with seasonal variation 03/19/2007   Asthma, moderate persistent 03/19/2007   GERD 03/19/2007   HIATAL HERNIA 03/19/2007   GESTATIONAL DIABETES 03/19/2007   Past Medical History:  Diagnosis Date   Allergy    takes Claritin daily and Flonase daily   Arthritis    Asthma    uses Albuterol daily as needed;Singulair nightly;DUlera daily   Back pain    Constipation    Fatty liver    Food allergy    GERD (gastroesophageal reflux disease)    occasionally will take OTC meds but states she just watches what she eats   H/O hiatal hernia    History of bronchitis    last time 42yrs ago   History of colon polyps    History of kidney stones    History of shingles    Hyperlipidemia    Hypertension    takes Hyzaar daily   Joint pain    Joint pain    Joint swelling    Obesity    Osteoarthritis    Pneumonia    hx of;last time at least 52yrs ago   PONV (postoperative nausea and vomiting)    Prediabetes    Shortness of breath    Sleep apnea    Swelling of lower extremity    Trouble in sleeping    Wheezing    Past Surgical History:  Procedure Laterality Date   BREAST EXCISIONAL BIOPSY Right    CARDIAC CATHETERIZATION     COLONOSCOPY     ESOPHAGOGASTRODUODENOSCOPY     KNEE SURGERY Right    arthroscopy   Right ganglion cyst     x 2   right lumpectomy  around 1983   STERIOD INJECTION Left 11/15/2013   Procedure: Marcaine/STEROID INJECTION;  Surgeon: Harvie Junior, MD;   Location[facility];  Service: Orthopedics;  Laterality: Left;   TOTAL KNEE ARTHROPLASTY Right 11/15/2013   Procedure: RIGHT TOTAL KNEE ARTHROPLASTY;  Surgeon: Harvie Junior, MD;  Location: [facility];  Service: Orthopedics;  Laterality: Right;   TOTAL KNEE ARTHROPLASTY Left 12/30/2016   Procedure: TOTAL KNEE ARTHROPLASTY;  Surgeon: Jodi Geralds, MD;  Location: [facility];  Service: Orthopedics;  Laterality: Left;   TUBAL LIGATION     Social History   Tobacco Use   Smoking status: Never   Smokeless tobacco: Never  Vaping Use   Vaping  Use: Never used  Substance Use Topics   Alcohol use: No    Alcohol/week: 0.0 standard drinks of alcohol   Drug use: No   Family History  Problem Relation Age of Onset   Diabetes Father    Cancer Father    Liver disease Father    Alcoholism Father    Sarcoidosis Mother    Glaucoma Mother    Hypertension Mother    Hyperlipidemia Mother    Sleep apnea Mother    Obesity Mother    Breast cancer Neg Hx    Allergies  Allergen Reactions   Ace Inhibitors Shortness Of Breath and Cough    Cough/ breathing problems    Amoxicillin-Pot Clavulanate Swelling    SWELLING REACTION UNSPECIFIED    Influenza Virus Vacc Split Pf     UNSPECIFIED REACTION    Shellfish Allergy Itching   Current Outpatient Medications on File Prior to Visit  Medication Sig Dispense Refill   albuterol (VENTOLIN HFA) 108 (90 Base) MCG/ACT inhaler Inhale 2 puffs into the lungs every 6 (six) hours as needed for wheezing or shortness of breath 6.7 g 12   baclofen (LIORESAL) 10 MG tablet Take 1 tablet (10 mg total) by mouth 2 (two) times daily as needed. (Patient taking differently: Take 10 mg by mouth at bedtime as needed.) 60 tablet 3   Calcium Citrate-Vitamin D (CALCIUM + D PO) Take 1 tablet by mouth daily.     Cholecalciferol (VITAMIN D3) 25 MCG (1000 UT) CAPS Take 1 capsule (1,000 Units total) by mouth daily. 30 capsule 0   diclofenac Sodium (VOLTAREN) 1 % GEL Apply 4 grams to skin four times a  day 960 g 5   DULoxetine (CYMBALTA) 60 MG capsule Take 1 capsule (60 mg total) by mouth at bedtime. 30 capsule 1   fluticasone-salmeterol (ADVAIR DISKUS) 250-50 MCG/ACT AEPB Inhale 1 puff into the lungs 2 times daily then rinse mouth 60 each 10   gabapentin (NEURONTIN) 100 MG capsule Take 1 capsule (100 mg) by mouth 3 times daily. 90 capsule 3   ipratropium-albuterol (DUONEB) 0.5-2.5 (3) MG/3ML SOLN Inhale 1 vial via nebulizer every 6 (six) hours as needed. 360 mL 12   loratadine (CLARITIN) 10 MG tablet Take 10 mg by mouth daily.     losartan-hydrochlorothiazide (HYZAAR) 100-25 MG tablet TAKE 1 TABLET BY MOUTH ONCE DAILY 90 tablet 3   meloxicam (MOBIC) 15 MG tablet Take 1 tablet (15 mg) by mouth daily as needed for pain. 30 tablet 1   metFORMIN (GLUCOPHAGE) 500 MG tablet Take 1 tablet by mouth 2 times daily with meals. 180 tablet 0   montelukast (SINGULAIR) 10 MG tablet Take 1 tablet (10 mg total) by mouth at bedtime. 30 tablet 11   Multiple Vitamin (MULTIVITAMIN PO) Take 1 tablet by mouth daily.     Nebulizers (COMPRESSOR/NEBULIZER) MISC Use as directed 1 each 0   rosuvastatin (CRESTOR) 10 MG tablet Take 1 tablet (10 mg total) by mouth daily. 90 tablet 3   traMADol (ULTRAM) 50 MG tablet Take 2 tablets (100 mg total) by mouth 4 (four) times daily as needed for pain. (Patient taking differently: Take 100 mg by mouth 4 (four) times daily as needed for moderate pain or severe pain. Takes 1 in am, 1 mid day and 2 at bedtime) 240 tablet 1   No current facility-administered medications on file prior to visit.    Review of Systems  Constitutional:  Negative for activity change, appetite change, fatigue, fever and unexpected  weight change.  HENT:  Negative for congestion, ear pain, rhinorrhea, sinus pressure and sore throat.   Eyes:  Negative for pain, redness and visual disturbance.  Respiratory:  Negative for cough, shortness of breath and wheezing.   Cardiovascular:  Negative for chest pain and  palpitations.  Gastrointestinal:  Negative for abdominal pain, blood in stool, constipation and diarrhea.  Endocrine: Negative for polydipsia and polyuria.  Genitourinary:  Negative for dysuria, frequency and urgency.  Musculoskeletal:  Positive for arthralgias and back pain. Negative for myalgias.  Skin:  Negative for pallor and rash.  Allergic/Immunologic: Negative for environmental allergies.  Neurological:  Negative for dizziness, syncope and headaches.  Hematological:  Negative for adenopathy. Does not bruise/bleed easily.  Psychiatric/Behavioral:  Negative for decreased concentration and dysphoric mood. The patient is not nervous/anxious.        Objective:   Physical Exam Constitutional:      General: She is not in acute distress.    Appearance: Normal appearance. She is well-developed.     Comments: Overweight   HENT:     Head: Normocephalic and atraumatic.  Eyes:     Conjunctiva/sclera: Conjunctivae normal.     Pupils: Pupils are equal, round, and reactive to light.  Neck:     Thyroid: No thyromegaly.     Vascular: No carotid bruit or JVD.  Cardiovascular:     Rate and Rhythm: Normal rate and regular rhythm.     Heart sounds: Normal heart sounds.     No gallop.  Pulmonary:     Effort: Pulmonary effort is normal. No respiratory distress.     Breath sounds: Normal breath sounds. No wheezing or rales.  Abdominal:     General: There is no distension or abdominal bruit.     Palpations: Abdomen is soft.  Musculoskeletal:     Cervical back: Normal range of motion and neck supple.     Right lower leg: No edema.     Left lower leg: No edema.     Comments: Some mild tenderness of LS (midline) LS flex about 90 degrees with pain  Mild thoracolumbar scoliosis   Some crepitus with rom both knees  Walks without a cane or walker   Lymphadenopathy:     Cervical: No cervical adenopathy.  Skin:    General: Skin is warm and dry.     Coloration: Skin is not pale.     Findings:  No rash.  Neurological:     Mental Status: She is alert.     Coordination: Coordination normal.     Deep Tendon Reflexes: Reflexes are normal and symmetric. Reflexes normal.  Psychiatric:        Mood and Affect: Mood normal.           Assessment & Plan:   Problem List Items Addressed This Visit       Other   Chronic pain syndrome    Knees/ back= OA  Did not improve much after knee surgery   Takes: Tramadol 120 per month Baclofen 30 per month Meloxicam usually 8-10 per week (caution in light of platelets)   We are taking over for pain clinic (no longer covered)  Records reviewed / still pending some   Pain contract signed       Chronic pain of both knees - Primary    Pt has had chronic pain in both knees since knee repl in 2015 and 2018 Right knee-is anterior Left knee is lateral   Per pt told but [facility]  ortho that nothing else could be done, also saw neuro  Then pain clinic (at [facility] also)=records pending   Now pain clinic is not covered by her ins  She takes  Tramadol 120 per month  Baclofen 30 per month Meloxicam usually 8-10 per week (caution in light of platelets)   Is able to work and function  We will take over medications Pain contract signed       Chronic low back pain    Midline / lumbar due to facet deg change and also small discussed bulge L5-S1 Reviewed last mri and LS   Takes She takes  Tramadol 120 per month  Baclofen 30 per month Meloxicam usually 8-10 per week (caution in light of platelets)   We are taking over pain management since her ins no longer pays for the clinic  Also for knee pain Contract signed No refills needed per day  Encouraged her to continue her stretches and PT exercises

## 2023-01-02 NOTE — Assessment & Plan Note (Signed)
Pt has had chronic pain in both knees since knee repl in 2015 and 2018 Right knee-is anterior Left knee is lateral   Per pt told but [facility] that nothing else could be done, also saw neuro  Then pain clinic (at [facility] also)=records pending   Now pain clinic is not covered by her ins  She takes  Tramadol 120 per month  Baclofen 30 per month Meloxicam usually 8-10 per week (caution in light of platelets)   Is able to work and function  We will take over medications Pain contract signed

## 2023-01-11 ENCOUNTER — Ambulatory Visit: Payer: Commercial Managed Care - PPO | Admitting: Neurology

## 2023-01-11 ENCOUNTER — Encounter: Payer: Self-pay | Admitting: Neurology

## 2023-01-11 ENCOUNTER — Other Ambulatory Visit (HOSPITAL_COMMUNITY): Payer: Self-pay

## 2023-01-11 VITALS — BP 114/79 | HR 74 | Ht 61.0 in | Wt 159.0 lb

## 2023-01-11 DIAGNOSIS — G43709 Chronic migraine without aura, not intractable, without status migrainosus: Secondary | ICD-10-CM | POA: Diagnosis not present

## 2023-01-11 MED ORDER — RIZATRIPTAN BENZOATE 5 MG PO TBDP
5.0000 mg | ORAL_TABLET | ORAL | 6 refills | Status: DC | PRN
  Filled 2023-01-11: qty 10, 30d supply, fill #0
  Filled 2023-01-11: qty 8, 24d supply, fill #0
  Filled 2023-01-11: qty 2, 6d supply, fill #0
  Filled 2023-01-11: qty 10, 30d supply, fill #0
  Filled 2023-01-12: qty 8, 24d supply, fill #1
  Filled 2023-03-29: qty 8, 24d supply, fill #2
  Filled 2023-08-12: qty 18, 30d supply, fill #3
  Filled 2023-10-14: qty 18, 30d supply, fill #4
  Filled 2023-12-01: qty 12, 20d supply, fill #0
  Filled 2024-01-02: qty 12, 20d supply, fill #1

## 2023-01-11 NOTE — Progress Notes (Signed)
Chief Complaint  Patient presents with   New Patient (Initial Visit)    Rm 14, alone, internal referral for other headache syndrome: 15 days per month, abnormal MRI, loss of balance: feels like she favors to the left       ASSESSMENT AND PLAN  Barbarajean Kinzler is a 62 y.o. female   Chronic migraine headache  Rizatriptan as needed  DIAGNOSTIC DATA (LABS, IMAGING, TESTING) - I reviewed patient records, labs, notes, testing and imaging myself where available.   MEDICAL HISTORY:  Dayani Winbush, is a 62 year old female seen in request by her primary care physician from [facility] Dr. Milinda Antis, [facility], for evaluation of headaches  I reviewed and summarized the referring note. PMHX HLD HTN Asthma History of kidney stone Hx of bilateral knee pain Chronic low back pain.   She used to have migraine headache when she was younger, often presenting with visual aura, followed by lateralized pounding headache with light noise sensitivity, lasting for few hours, improved after 4 days  She is going through menopause over the past couple years, reported increased headaches since May 2024, couple times each months, retro-orbital area severe pounding headache with nausea vomiting, over-the-counter Tylenol and Excedrin Migraine provide some help, headaches lasting for few hours also helped by sleep  Common trigger for headache, weather change, missing the meal  Laboratory June 2024 normal ESR C-reactive protein TSH  Personally reviewed MRI of the brain with without contrast December 22, 2022, mild small vessel disease most consistent with her long history of migraine   PHYSICAL EXAM:   Vitals:   01/11/23 1122  BP: 114/79  Pulse: 74  Weight: 159 lb (72.1 kg)  Height: 5\' 1"  (1.549 m)   Not recorded     Body mass index is 30.04 kg/m.  PHYSICAL EXAMNIATION:  Gen: NAD, conversant, well nourised, well groomed                     Cardiovascular: Regular rate rhythm, no  peripheral edema, warm, nontender. Eyes: Conjunctivae clear without exudates or hemorrhage Neck: Supple, no carotid bruits. Pulmonary: Clear to auscultation bilaterally   NEUROLOGICAL EXAM:  MENTAL STATUS: Speech/cognition: Awake, alert, oriented to history taking and casual conversation CRANIAL NERVES: CN II: Visual fields are full to confrontation. Pupils are round equal and briskly reactive to light. CN III, IV, VI: extraocular movement are normal. No ptosis. CN V: Facial sensation is intact to light touch CN VII: Face is symmetric with normal eye closure  CN VIII: Hearing is normal to causal conversation. CN IX, X: Phonation is normal. CN XI: Head turning and shoulder shrug are intact  MOTOR: There is no pronator drift of out-stretched arms. Muscle bulk and tone are normal. Muscle strength is normal.  REFLEXES: Reflexes are 2+ and symmetric at the biceps, triceps, knees, and ankles. Plantar responses are flexor.  SENSORY: Intact to light touch, pinprick and vibratory sensation are intact in fingers and toes.  COORDINATION: There is no trunk or limb dysmetria noted.  GAIT/STANCE: Posture is normal. Gait is steady with normal steps, base, arm swing, and turning. Heel and toe walking are normal. Tandem gait is normal.  Romberg is absent.  REVIEW OF SYSTEMS:  Full 14 system review of systems performed and notable only for as above All other review of systems were negative.   ALLERGIES: Allergies  Allergen Reactions   Ace Inhibitors Shortness Of Breath and Cough    Cough/ breathing problems    Amoxicillin-Pot Clavulanate Swelling  SWELLING REACTION UNSPECIFIED    Influenza Virus Vacc Split Pf     UNSPECIFIED REACTION    Shellfish Allergy Itching    HOME MEDICATIONS: Current Outpatient Medications  Medication Sig Dispense Refill   albuterol (VENTOLIN HFA) 108 (90 Base) MCG/ACT inhaler Inhale 2 puffs into the lungs every 6 (six) hours as needed for wheezing or  shortness of breath 6.7 g 12   baclofen (LIORESAL) 10 MG tablet Take 1 tablet (10 mg total) by mouth 2 (two) times daily as needed. (Patient taking differently: Take 10 mg by mouth at bedtime as needed.) 60 tablet 3   Calcium Citrate-Vitamin D (CALCIUM + D PO) Take 1 tablet by mouth daily.     Cholecalciferol (VITAMIN D3) 25 MCG (1000 UT) CAPS Take 1 capsule (1,000 Units total) by mouth daily. 30 capsule 0   diclofenac Sodium (VOLTAREN) 1 % GEL Apply 4 grams to skin four times a day 960 g 5   DULoxetine (CYMBALTA) 60 MG capsule Take 1 capsule (60 mg total) by mouth at bedtime. 30 capsule 1   fluticasone-salmeterol (ADVAIR DISKUS) 250-50 MCG/ACT AEPB Inhale 1 puff into the lungs 2 times daily then rinse mouth 60 each 10   gabapentin (NEURONTIN) 100 MG capsule Take 1 capsule (100 mg) by mouth 3 times daily. 90 capsule 3   ipratropium-albuterol (DUONEB) 0.5-2.5 (3) MG/3ML SOLN Inhale 1 vial via nebulizer every 6 (six) hours as needed. 360 mL 12   loratadine (CLARITIN) 10 MG tablet Take 10 mg by mouth daily.     losartan-hydrochlorothiazide (HYZAAR) 100-25 MG tablet TAKE 1 TABLET BY MOUTH ONCE DAILY 90 tablet 3   meloxicam (MOBIC) 15 MG tablet Take 1 tablet (15 mg) by mouth daily as needed for pain. 30 tablet 1   metFORMIN (GLUCOPHAGE) 500 MG tablet Take 1 tablet by mouth 2 times daily with meals. 180 tablet 0   montelukast (SINGULAIR) 10 MG tablet Take 1 tablet (10 mg total) by mouth at bedtime. 30 tablet 11   Multiple Vitamin (MULTIVITAMIN PO) Take 1 tablet by mouth daily.     Nebulizers (COMPRESSOR/NEBULIZER) MISC Use as directed 1 each 0   rosuvastatin (CRESTOR) 10 MG tablet Take 1 tablet (10 mg total) by mouth daily. 90 tablet 3   traMADol (ULTRAM) 50 MG tablet Take 2 tablets (100 mg total) by mouth 4 (four) times daily as needed for pain. (Patient taking differently: Take 100 mg by mouth 4 (four) times daily as needed for moderate pain or severe pain. Takes 1 in am, 1 mid day and 2 at bedtime) 240  tablet 1   No current facility-administered medications for this visit.    PAST MEDICAL HISTORY: Past Medical History:  Diagnosis Date   Allergy    takes Claritin daily and Flonase daily   Arthritis    Asthma    uses Albuterol daily as needed;Singulair nightly;DUlera daily   Back pain    Constipation    Fatty liver    Food allergy    GERD (gastroesophageal reflux disease)    occasionally will take OTC meds but states she just watches what she eats   H/O hiatal hernia    History of bronchitis    last time 34yrs ago   History of colon polyps    History of kidney stones    History of shingles    Hyperlipidemia    Hypertension    takes Hyzaar daily   Joint pain    Joint pain    Joint swelling  Obesity    Osteoarthritis    Pneumonia    hx of;last time at least 45yrs ago   PONV (postoperative nausea and vomiting)    Prediabetes    Shortness of breath    Sleep apnea    Swelling of lower extremity    Trouble in sleeping    Wheezing     PAST SURGICAL HISTORY: Past Surgical History:  Procedure Laterality Date   BREAST EXCISIONAL BIOPSY Right    CARDIAC CATHETERIZATION     COLONOSCOPY     ESOPHAGOGASTRODUODENOSCOPY     KNEE SURGERY Right    arthroscopy   Right ganglion cyst     x 2   right lumpectomy  around 1983   STERIOD INJECTION Left 11/15/2013   Procedure: Marcaine/STEROID INJECTION;  Surgeon: Harvie Junior, MD;  Location: [facility];  Service: Orthopedics;  Laterality: Left;   TOTAL KNEE ARTHROPLASTY Right 11/15/2013   Procedure: RIGHT TOTAL KNEE ARTHROPLASTY;  Surgeon: Harvie Junior, MD;  Location: [facility];  Service: Orthopedics;  Laterality: Right;   TOTAL KNEE ARTHROPLASTY Left 12/30/2016   Procedure: TOTAL KNEE ARTHROPLASTY;  Surgeon: Jodi Geralds, MD;  Location: [facility];  Service: Orthopedics;  Laterality: Left;   TUBAL LIGATION      FAMILY HISTORY: Family History  Problem Relation Age of Onset   Diabetes Father    Cancer Father    Liver disease Father     Alcoholism Father    Sarcoidosis Mother    Glaucoma Mother    Hypertension Mother    Hyperlipidemia Mother    Sleep apnea Mother    Obesity Mother    Breast cancer Neg Hx     SOCIAL HISTORY: Social History   Socioeconomic History   Marital status: Married    Spouse name: Casimiro Needle   Number of children: 1   Years of education: Not on file   Highest education level: Some college, no degree  Occupational History   Occupation: Medical laboratory scientific officer: [employer]    Employer: [employer]  Tobacco Use   Smoking status: Never   Smokeless tobacco: Never  Vaping Use   Vaping Use: Never used  Substance and Sexual Activity   Alcohol use: No    Alcohol/week: 0.0 standard drinks of alcohol   Drug use: No   Sexual activity: Yes    Birth control/protection: None  Other Topics Concern   Not on file  Social History Narrative   Lives in [place]; works at [employer].  No alcohol.   Social Determinants of Health   Financial Resource Strain: Low Risk  (12/16/2022)   Overall Financial Resource Strain (CARDIA)    Difficulty of Paying Living Expenses: Not hard at all  Food Insecurity: No Food Insecurity (12/16/2022)   Hunger Vital Sign    Worried About Running Out of Food in the Last Year: Never true    Ran Out of Food in the Last Year: Never true  Transportation Needs: No Transportation Needs (12/16/2022)   PRAPARE - Administrator, Civil Service (Medical): No    Lack of Transportation (Non-Medical): No  Physical Activity: Sufficiently Active (12/16/2022)   Exercise Vital Sign    Days of Exercise per Week: 5 days    Minutes of Exercise per Session: 30 min  Stress: Not on file  Social Connections: Moderately Integrated (12/16/2022)   Social Connection and Isolation Panel [NHANES]    Frequency of Communication with Friends and Family: Once a week  Frequency of Social Gatherings with Friends and Family: Once a week    Attends Religious Services: More than 4 times per  year    Active Member of [employer] or Organizations: Yes    Attends Engineer, structural: More than 4 times per year    Marital Status: Married  Catering manager Violence: Not on file      Levert Feinstein, M.D. Ph.D.  [facility] [address], [address] [place], [place] [address] Ph: [phone] Fax: [phone]  CC:  Tower, Audrie Gallus, MD [address] [place],  [place] [address]  Tower, Audrie Gallus, MD

## 2023-01-12 ENCOUNTER — Encounter: Payer: Self-pay | Admitting: Neurology

## 2023-01-12 ENCOUNTER — Other Ambulatory Visit: Payer: Self-pay

## 2023-01-13 ENCOUNTER — Other Ambulatory Visit (HOSPITAL_COMMUNITY): Payer: Self-pay

## 2023-01-17 ENCOUNTER — Encounter: Payer: Self-pay | Admitting: Family Medicine

## 2023-01-23 ENCOUNTER — Encounter: Payer: Self-pay | Admitting: Family Medicine

## 2023-01-23 ENCOUNTER — Ambulatory Visit (INDEPENDENT_AMBULATORY_CARE_PROVIDER_SITE_OTHER): Payer: Commercial Managed Care - PPO | Admitting: Physician Assistant

## 2023-01-23 ENCOUNTER — Other Ambulatory Visit (HOSPITAL_COMMUNITY): Payer: Self-pay

## 2023-01-23 MED ORDER — DULOXETINE HCL 60 MG PO CPEP
60.0000 mg | ORAL_CAPSULE | Freq: Every day | ORAL | 2 refills | Status: DC
  Filled 2023-01-23: qty 90, 90d supply, fill #0
  Filled 2023-04-24: qty 90, 90d supply, fill #1
  Filled 2023-07-18 (×2): qty 90, 90d supply, fill #2

## 2023-01-23 NOTE — Telephone Encounter (Addendum)
Last OV was on 01/02/23 to discuss pain, last filled on 12/01/22 #30 caps with 1 refill

## 2023-01-30 ENCOUNTER — Ambulatory Visit (INDEPENDENT_AMBULATORY_CARE_PROVIDER_SITE_OTHER): Payer: Commercial Managed Care - PPO | Admitting: Adult Health

## 2023-01-30 ENCOUNTER — Encounter (INDEPENDENT_AMBULATORY_CARE_PROVIDER_SITE_OTHER): Payer: Self-pay | Admitting: Adult Health

## 2023-01-30 VITALS — BP 117/70 | HR 74 | Temp 98.9°F | Ht 61.0 in | Wt 156.0 lb

## 2023-01-30 DIAGNOSIS — E559 Vitamin D deficiency, unspecified: Secondary | ICD-10-CM | POA: Diagnosis not present

## 2023-01-30 DIAGNOSIS — I10 Essential (primary) hypertension: Secondary | ICD-10-CM | POA: Diagnosis not present

## 2023-01-30 DIAGNOSIS — E669 Obesity, unspecified: Secondary | ICD-10-CM

## 2023-01-30 DIAGNOSIS — Z6829 Body mass index (BMI) 29.0-29.9, adult: Secondary | ICD-10-CM

## 2023-01-30 DIAGNOSIS — R7303 Prediabetes: Secondary | ICD-10-CM | POA: Diagnosis not present

## 2023-01-30 DIAGNOSIS — Z0000 Encounter for general adult medical examination without abnormal findings: Secondary | ICD-10-CM

## 2023-01-30 NOTE — Progress Notes (Signed)
WEIGHT SUMMARY AND BIOMETRICS  Vitals Temp: 98.9 F (37.2 C) BP: 117/70 Pulse Rate: 74 SpO2: 97 %   Anthropometric Measurements Height: 5\' 1"  (1.549 m) Weight: 156 lb (70.8 kg) BMI (Calculated): 29.49 Weight at Last Visit: 156lb Weight Lost Since Last Visit: 0 Weight Gained Since Last Visit: 0 Starting Weight: 198lb Total Weight Loss (lbs): 42 lb (19.1 kg)   Body Composition  Body Fat %: 39.9 % Fat Mass (lbs): 62.4 lbs Muscle Mass (lbs): 89.4 lbs Total Body Water (lbs): 64 lbs Visceral Fat Rating : 10   Other Clinical Data Fasting: No Labs: no Today's Visit #: 58 Starting Date: 08/30/18    Chief Complaint:   OBESITY Katherine Tanner is here to discuss her progress with her obesity treatment plan. She is on the the Category 2 Plan and states she is following her eating plan approximately 90 % of the time. She states she is exercising Bike/Chair Exercises 30/30 minutes 5 times per week.   Interim History:  Katherine Tanner continues to exercise via stationary bike and chair exercises. Her PCP/Dr. Milinda Antis assumed management of her chronic pain.  PCP also started her on Gabapentin 100mg  TID to help with migraine prophylaxis.  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  Subjective:   1. Vitamin D deficiency Discussed Labs  Latest Reference Range & Units 12/12/22 11:39  Vitamin D, 25-Hydroxy 30.0 - 100.0 ng/mL 74.0    Latest Reference Range & Units 12/12/22 11:39  Vitamin B12 232 - 1245 pg/mL >2000 (H)  (H): Data is abnormally high She is on daily OTC VIt D 1000 and daily OTC Multivitamin  2. Prediabetes Discussed Labs  Latest Reference Range & Units 12/12/22 11:39  Glucose 70 - 99 mg/dL 98  Hemoglobin Z6X 4.8 - 5.6 % 5.6  Est. average glucose Bld gHb Est-mCnc mg/dL 096  INSULIN 2.6 - 04.5 uIU/mL 7.5  She in on Metformin 500mg  BID- managed by [facility] Levels stable  3.  Essential hypertension Discussed Labs BP at goal 12/12/2022 CMP- electrolytes and kidney fx stable. Liver enzymes slightly elevated   Latest Reference Range & Units 12/12/22 11:39  ALT 0 - 32 IU/L 35 (H)  (H): Data is abnormally high She is on losartan-hydrochlorothiazide (HYZAAR) 100-25 MG tablet daily  4. Health Care Maintenance Discussed Labs   Latest Reference Range & Units 12/12/22 11:39  Vitamin B12 232 - 1245 pg/mL >2000 (H)  (H): Data is abnormally high B12 above goal  Lipid Panel     Component Value Date/Time   CHOL 170 12/12/2022 1139   TRIG 42 12/12/2022 1139   HDL 76 12/12/2022 1139   CHOLHDL 2 07/11/2022 0754   VLDL 6.4 07/11/2022 0754   LDLCALC 85 12/12/2022 1139   LDLDIRECT 147.9 12/17/2012 1604   LABVLDL 9 12/12/2022 1139   The 10-year ASCVD risk score (Arnett DK, et al., 2019) is: 3.8%   Values used to calculate the score:     Age: 62 years     Sex: Female     Is Non-Hispanic African American: Yes     Diabetic: No     Tobacco smoker: No     Systolic Blood Pressure: 117 mmHg     Is BP treated: Yes     HDL Cholesterol: 76 mg/dL     Total Cholesterol: 170 mg/dL  She is on daily Crestor 10mg  every day- denies myalgias She is on daily OTC  Multivitamin and an additional B12 supplementation  Assessment/Plan:   1. Vitamin D deficiency Continue current supplementation  2. Prediabetes Continue Metformin therapy  3. Essential hypertension Continue current losartan-hydrochlorothiazide (HYZAAR) 100-25 MG tablet   4. Health Care Maintenance Continue current statin therapy Continue daily OTC Multivitamin and additional B12 supplementation STOP additional B12 supplementation  5. Current BMI 29.49  Katherine Tanner is currently in the action stage of change. As such, her goal is to continue with weight loss efforts. She has agreed to the Category 2 Plan.   Exercise goals: All adults should avoid inactivity. Some physical activity is better than none, and adults who  participate in any amount of physical activity gain some health benefits. Adults should also include muscle-strengthening activities that involve all major muscle groups on 2 or more days a week.  Behavioral modification strategies: increasing lean protein intake, decreasing simple carbohydrates, increasing vegetables, increasing water intake, no skipping meals, meal planning and cooking strategies, keeping healthy foods in the home, avoiding temptations, and planning for success.  Katherine Tanner has agreed to follow-up with our clinic in 4 weeks. She was informed of the importance of frequent follow-up visits to maximize her success with intensive lifestyle modifications for her multiple health conditions.   Objective:   Blood pressure 117/70, pulse 74, temperature 98.9 F (37.2 C), height 5\' 1"  (1.549 m), weight 156 lb (70.8 kg), last menstrual period 02/20/2014, SpO2 97%. Body mass index is 29.48 kg/m.  General: Cooperative, alert, well developed, in no acute distress. HEENT: Conjunctivae and lids unremarkable. Cardiovascular: Regular rhythm.  Lungs: Normal work of breathing. Neurologic: No focal deficits.   Lab Results  Component Value Date   CREATININE 0.63 12/12/2022   BUN 18 12/12/2022   NA 141 12/12/2022   K 4.2 12/12/2022   CL 100 12/12/2022   CO2 27 12/12/2022   Lab Results  Component Value Date   ALT 35 (H) 12/12/2022   AST 27 12/12/2022   ALKPHOS 118 12/12/2022   BILITOT 0.5 12/12/2022   Lab Results  Component Value Date   HGBA1C 5.6 12/12/2022   HGBA1C 5.8 07/11/2022   HGBA1C 5.6 03/28/2022   HGBA1C 5.6 12/20/2021   HGBA1C 5.6 09/28/2021   Lab Results  Component Value Date   INSULIN 7.5 12/12/2022   INSULIN 6.9 09/12/2022   INSULIN 6.7 03/28/2022   INSULIN 10.0 12/20/2021   INSULIN 3.7 09/28/2021   Lab Results  Component Value Date   TSH 0.931 12/12/2022   Lab Results  Component Value Date   CHOL 170 12/12/2022   HDL 76 12/12/2022   LDLCALC 85  12/12/2022   LDLDIRECT 147.9 12/17/2012   TRIG 42 12/12/2022   CHOLHDL 2 07/11/2022   Lab Results  Component Value Date   VD25OH 74.0 12/12/2022   VD25OH 42.04 07/11/2022   VD25OH 45.1 03/28/2022   Lab Results  Component Value Date   WBC 4.6 12/19/2022   HGB 13.2 12/19/2022   HCT 40.5 12/19/2022   MCV 89.4 12/19/2022   PLT 116.0 (L) 12/19/2022   Lab Results  Component Value Date   IRON 63 07/07/2020    Attestation Statements:   Reviewed by clinician on day of visit: allergies, medications, problem list, medical history, surgical history, family history, social history, and previous encounter notes.  I have reviewed the above documentation for accuracy and completeness, and I agree with the above. -  Juno Alers d. Annielee Jemmott, NP-C

## 2023-02-07 ENCOUNTER — Encounter: Payer: Self-pay | Admitting: Family Medicine

## 2023-02-08 ENCOUNTER — Other Ambulatory Visit (HOSPITAL_COMMUNITY): Payer: Self-pay

## 2023-02-08 MED ORDER — BACLOFEN 10 MG PO TABS
10.0000 mg | ORAL_TABLET | Freq: Two times a day (BID) | ORAL | 3 refills | Status: DC | PRN
  Filled 2023-02-08: qty 60, 30d supply, fill #0
  Filled 2023-03-29: qty 60, 30d supply, fill #1
  Filled 2023-04-29: qty 60, 30d supply, fill #2
  Filled 2023-06-03: qty 60, 30d supply, fill #3

## 2023-02-13 ENCOUNTER — Other Ambulatory Visit: Payer: Self-pay

## 2023-02-13 ENCOUNTER — Other Ambulatory Visit (HOSPITAL_COMMUNITY): Payer: Self-pay

## 2023-02-14 ENCOUNTER — Other Ambulatory Visit (HOSPITAL_COMMUNITY): Payer: Self-pay

## 2023-02-17 ENCOUNTER — Other Ambulatory Visit (HOSPITAL_COMMUNITY): Payer: Self-pay

## 2023-02-17 ENCOUNTER — Inpatient Hospital Stay: Payer: Commercial Managed Care - PPO

## 2023-02-17 ENCOUNTER — Inpatient Hospital Stay: Payer: Commercial Managed Care - PPO | Attending: Internal Medicine

## 2023-02-17 DIAGNOSIS — D696 Thrombocytopenia, unspecified: Secondary | ICD-10-CM | POA: Diagnosis not present

## 2023-02-17 LAB — CBC WITH DIFFERENTIAL (CANCER CENTER ONLY)
Abs Immature Granulocytes: 0.02 10*3/uL (ref 0.00–0.07)
Basophils Absolute: 0.1 10*3/uL (ref 0.0–0.1)
Basophils Relative: 1 %
Eosinophils Absolute: 0.4 10*3/uL (ref 0.0–0.5)
Eosinophils Relative: 6 %
HCT: 37.2 % (ref 36.0–46.0)
Hemoglobin: 11.9 g/dL — ABNORMAL LOW (ref 12.0–15.0)
Immature Granulocytes: 0 %
Lymphocytes Relative: 34 %
Lymphs Abs: 2.4 10*3/uL (ref 0.7–4.0)
MCH: 28.6 pg (ref 26.0–34.0)
MCHC: 32 g/dL (ref 30.0–36.0)
MCV: 89.4 fL (ref 80.0–100.0)
Monocytes Absolute: 0.8 10*3/uL (ref 0.1–1.0)
Monocytes Relative: 11 %
Neutro Abs: 3.4 10*3/uL (ref 1.7–7.7)
Neutrophils Relative %: 48 %
Platelet Count: 115 10*3/uL — ABNORMAL LOW (ref 150–400)
RBC: 4.16 MIL/uL (ref 3.87–5.11)
RDW: 13.5 % (ref 11.5–15.5)
WBC Count: 7.1 10*3/uL (ref 4.0–10.5)
nRBC: 0 % (ref 0.0–0.2)

## 2023-02-20 NOTE — Telephone Encounter (Signed)
That is a change, am I wrong?  Who did you last see for your back (besides the pain clinic)  Orthopedics or neuro surgeon at any time? It sounds like you need a re evaluation

## 2023-02-22 MED ORDER — TRAMADOL HCL 50 MG PO TABS
ORAL_TABLET | ORAL | 0 refills | Status: DC
  Filled 2023-02-22: qty 120, 30d supply, fill #0

## 2023-02-22 NOTE — Telephone Encounter (Signed)
Will route to PCP. Looks like baclofen last filled recently but pt is requesting refill of tramadol that's not on med list

## 2023-02-22 NOTE — Addendum Note (Signed)
Addended by: Roxy Manns A on: 02/22/2023 09:06 PM   Modules accepted: Orders

## 2023-02-23 ENCOUNTER — Other Ambulatory Visit (HOSPITAL_COMMUNITY): Payer: Self-pay

## 2023-02-23 ENCOUNTER — Other Ambulatory Visit: Payer: Self-pay

## 2023-02-26 NOTE — Progress Notes (Unsigned)
.[facility]  Office: [phone]  /  Fax: [phone]  WEIGHT SUMMARY AND BIOMETRICS  Vitals Temp: 98.3 F (36.8 C) BP: 117/71 Pulse Rate: 79 SpO2: 100 %   Anthropometric Measurements Height: 5\' 1"  (1.549 m) Weight: 159 lb (72.1 kg) BMI (Calculated): 30.06 Weight at Last Visit: 156 lb Weight Lost Since Last Visit: 0 Weight Gained Since Last Visit: 3 lb Starting Weight: 198 lb Total Weight Loss (lbs): 39 lb (17.7 kg) Peak Weight: 198 lb   Body Composition  Body Fat %: 40.9 % Fat Mass (lbs): 65.2 lbs Muscle Mass (lbs): 89.4 lbs Total Body Water (lbs): 65.6 lbs Visceral Fat Rating : 10   Other Clinical Data Fasting: no Labs: no Today's Visit #: 35 Starting Date: 08/30/18     HPI  Chief Complaint: OBESITY  Katherine Tanner is here to discuss her progress with her obesity treatment plan. She is on the the Category 2 Plan and states she is following her eating plan approximately 85 % of the time. She states she is exercising stretching/bike 30 minutes 5 times per week.  Discussed the use of AI scribe software for clinical note transcription with the patient, who gave verbal consent to proceed.  History of Present Illness        The patient, with a history of obesity, prediabetes, hypertension, and vitamin D deficiency, presents for a follow-up visit. The patient has been on gabapentin for a couple of months for migraine prevention, which she reports has been effective in reducing the frequency and severity of her migraines. However, the patient has been experiencing constipation, which she believes has worsened since starting gabapentin. The patient reports only having bowel movements twice a week, compared to her previous pattern of every other day. She has been taking Miralax daily and recently started taking fiber gummies and a probiotic. Despite these measures, the constipation persists. The patient also mentions experiencing sciatic symptoms for the past two weeks, which has  affected her exercise routine. She has been doing stretches and riding a bike a few times a week for 30 minutes.  Interval History:  Since last office visit she up 3 lbs.  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  Exercise-Some recent sciatica and doing stretching exercises.  Hydration-at least 64 oz daily.   Will celebrate 72 years with Troy 03/27/23 !!!! CONGRATS!   Pharmacotherapy: metformin for prediabetes. No GI upset or other side effects with metformin.   TREATMENT PLAN FOR OBESITY: Obesity Weight stable but patient desires further weight loss. Goal of 150 lbs.  -Continue current diet and exercise regimen. Total weight loss of 39 lbs  TBW loss of 19.7% !! Recommended Dietary Goals  Katherine Tanner is currently in the action stage of change. As such, her goal is to continue weight management plan. She has agreed to the Category 2 Plan.  Behavioral Intervention  We discussed the following Behavioral Modification Strategies today: increasing lean protein intake, decreasing simple carbohydrates , increasing vegetables, increasing lower glycemic fruits, increasing fiber rich foods, increasing water intake, work on managing stress, creating time for self-care and relaxation measures, continue to practice mindfulness when eating, and planning for success.  Additional resources provided today: NA  Recommended Physical Activity Goals  Katherine Tanner has been advised to work up to 150 minutes of moderate intensity aerobic activity a week and strengthening exercises 2-3 times per week for cardiovascular health, weight loss maintenance and preservation of muscle mass.  She has agreed to Continue current level of physical activity  and Think about ways to increase daily physical activity and overcoming barriers to exercise   Pharmacotherapy We discussed various medication options to  help Katherine Tanner with her weight loss efforts and we both agreed to continue metformin for prediabetes.    Return in about 5 weeks (around 04/03/2023).[name] She was informed of the importance of frequent follow up visits to maximize her success with intensive lifestyle modifications for her multiple health conditions.  PHYSICAL EXAM:  Blood pressure 117/71, pulse 79, temperature 98.3 F (36.8 C), height 5\' 1"  (1.549 m), weight 159 lb (72.1 kg), last menstrual period 02/20/2014, SpO2 100%. Body mass index is 30.04 kg/m.  General: She is overweight, cooperative, alert, well developed, and in no acute distress. PSYCH: Has normal mood, affect and thought process.   Cardiovascular: HR 70's regular, BP 117/71 Lungs: Normal breathing effort, no conversational dyspnea. Neuro: no focal deficits  DIAGNOSTIC DATA REVIEWED:  BMET    Component Value Date/Time   NA 141 12/12/2022 1139   K 4.2 12/12/2022 1139   CL 100 12/12/2022 1139   CO2 27 12/12/2022 1139   GLUCOSE 98 12/12/2022 1139   GLUCOSE 83 11/18/2022 1447   BUN 18 12/12/2022 1139   CREATININE 0.63 12/12/2022 1139   CALCIUM 9.9 12/12/2022 1139   GFRNONAA >60 11/18/2022 1447   GFRAA 123 03/23/2020 1306   Lab Results  Component Value Date   HGBA1C 5.6 12/12/2022   HGBA1C 6.2 02/24/2014   Lab Results  Component Value Date   INSULIN 7.5 12/12/2022   INSULIN 9.3 08/30/2018   Lab Results  Component Value Date   TSH 0.931 12/12/2022   CBC    Component Value Date/Time   WBC 7.1 02/17/2023 1450   WBC 4.6 12/19/2022 1055   RBC 4.16 02/17/2023 1450   HGB 11.9 (L) 02/17/2023 1450   HGB 12.9 12/24/2019 1627   HCT 37.2 02/17/2023 1450   HCT 40.2 12/24/2019 1627   PLT 115 (L) 02/17/2023 1450   PLT 135 (L) 12/24/2019 1627   MCV 89.4 02/17/2023 1450   MCV 87 12/24/2019 1627   MCH 28.6 02/17/2023 1450   MCHC 32.0 02/17/2023 1450   RDW 13.5 02/17/2023 1450   RDW 13.2 12/24/2019 1627   Iron Studies    Component Value Date/Time    IRON 63 07/07/2020 1334   Lipid Panel     Component Value Date/Time   CHOL 170 12/12/2022 1139   TRIG 42 12/12/2022 1139   HDL 76 12/12/2022 1139   CHOLHDL 2 07/11/2022 0754   VLDL 6.4 07/11/2022 0754   LDLCALC 85 12/12/2022 1139   LDLDIRECT 147.9 12/17/2012 1604   Hepatic Function Panel     Component Value Date/Time   PROT 7.0 12/12/2022 1139   ALBUMIN 4.5 12/12/2022 1139   AST 27 12/12/2022 1139   ALT 35 (H) 12/12/2022 1139   ALKPHOS 118 12/12/2022 1139   BILITOT 0.5 12/12/2022 1139   BILIDIR 0.1 01/02/2012 1626   IBILI 0.3 01/05/2010 2038      Component Value Date/Time   TSH 0.931 12/12/2022 1139   Nutritional Lab Results  Component Value Date   VD25OH 74.0 12/12/2022   VD25OH 42.04 07/11/2022   VD25OH 45.1 03/28/2022    ASSOCIATED CONDITIONS ADDRESSED TODAY  ASSESSMENT AND PLAN  Problem List Items Addressed This Visit     Essential hypertension   Prediabetes - Primary   Relevant Medications   metFORMIN (GLUCOPHAGE) 500 MG tablet  Vitamin D deficiency   Other Visit Diagnoses     Morbid obesity (HCC), Starting BMI 37.41       Relevant Medications   metFORMIN (GLUCOPHAGE) 500 MG tablet      Prediabetes Last A1c was 5.6- at goal. Insulin 7.5 nearly at goal.   Medication(s): Metformin 500 mg twice daily with meals No GI upset. Recent worsening of constipation after starting gabapentin for migraine prophylaxis.  She is working on Engineer, technical sales to decrease simple carbohydrates, increase lean proteins and exercise to promote weight loss, improve glycemic control and prevent progression to Type 2 diabetes.   Polyphagia:No Lab Results  Component Value Date   HGBA1C 5.6 12/12/2022   HGBA1C 5.8 07/11/2022   HGBA1C 5.6 03/28/2022   HGBA1C 5.6 12/20/2021   HGBA1C 5.6 09/28/2021   Lab Results  Component Value Date   INSULIN 7.5 12/12/2022   INSULIN 6.9 09/12/2022   INSULIN 6.7 03/28/2022   INSULIN 10.0 12/20/2021   INSULIN 3.7 09/28/2021     Plan: Continue and refill Metformin 500 mg twice daily with meals Continue working on nutrition plan to decrease simple carbohydrates, increase lean proteins and exercise to promote weight loss, improve glycemic control and prevent progression to Type 2 diabetes.   Constipation Katherine Tanner notes constipation.   She has had chronic issues but seems worse following starting gabapentin for migraine prophylaxis. Cannot take topamax due to history of kidney stones and strong family history of glaucoma.  She has been taking miralax 17 grams daily and fiber gummies and daily probiotic capsule . Constipation is poorly controlled.  History of benign polyps, due for repeat colonoscopy in 2025. No abdominal pain or other concerning symptoms. Feels symptoms worsened with starting gabapentin for migraine prophylaxis (which has helped significantly with migraines. ) Plan: Start psyllium  husk capsules 2 daily and increase gradually to 4 daily if needed. Increase pre-and probiotics with drinkable yogurt such as Pillars drinkable yogurts daily.   Continue to hydrate very well daily at least 80 oz and exercise daily to encourage bowel motility.  Could refer back to GI if any other concerns.  Could send for pelvic floor PT at [facility] [place] if continues with significant constipation issues.   Migraine On Gabapentin for prevention, with noted improvement in frequency and severity. Maxalt used for acute episodes. Notes decreased use of rescue medication since starting gabapentin. Some sciatica recently as well which may be improved by gabapentin.  -Plan: Continue Gabapentin three times daily.Maxalt for rescue. Monitor with PCP- Dr. Milinda Antis.     ATTESTASTION STATEMENTS:  Reviewed by clinician on day of visit: allergies, medications, problem list, medical history, surgical history, family history, social history, and previous encounter notes.   I have personally spent 41 minutes total time today  in preparation, patient care, nutritional counseling and documentation for this visit, including the following: review of clinical lab tests; review of medical tests/procedures/services.      Anjolie Majer, PA-C

## 2023-02-27 ENCOUNTER — Ambulatory Visit (INDEPENDENT_AMBULATORY_CARE_PROVIDER_SITE_OTHER): Payer: Commercial Managed Care - PPO | Admitting: Physician Assistant

## 2023-02-27 ENCOUNTER — Encounter (INDEPENDENT_AMBULATORY_CARE_PROVIDER_SITE_OTHER): Payer: Self-pay | Admitting: Physician Assistant

## 2023-02-27 ENCOUNTER — Other Ambulatory Visit (HOSPITAL_COMMUNITY): Payer: Self-pay

## 2023-02-27 VITALS — BP 117/71 | HR 79 | Temp 98.3°F | Ht 61.0 in | Wt 159.0 lb

## 2023-02-27 DIAGNOSIS — K5900 Constipation, unspecified: Secondary | ICD-10-CM | POA: Diagnosis not present

## 2023-02-27 DIAGNOSIS — R7303 Prediabetes: Secondary | ICD-10-CM | POA: Diagnosis not present

## 2023-02-27 DIAGNOSIS — E559 Vitamin D deficiency, unspecified: Secondary | ICD-10-CM

## 2023-02-27 DIAGNOSIS — Z6830 Body mass index (BMI) 30.0-30.9, adult: Secondary | ICD-10-CM | POA: Diagnosis not present

## 2023-02-27 DIAGNOSIS — G43909 Migraine, unspecified, not intractable, without status migrainosus: Secondary | ICD-10-CM

## 2023-02-27 DIAGNOSIS — I10 Essential (primary) hypertension: Secondary | ICD-10-CM | POA: Diagnosis not present

## 2023-02-27 DIAGNOSIS — G43709 Chronic migraine without aura, not intractable, without status migrainosus: Secondary | ICD-10-CM

## 2023-02-27 MED ORDER — METFORMIN HCL 500 MG PO TABS
500.0000 mg | ORAL_TABLET | Freq: Two times a day (BID) | ORAL | 0 refills | Status: DC
Start: 2023-02-27 — End: 2023-05-08
  Filled 2023-02-27 – 2023-03-02 (×4): qty 180, 90d supply, fill #0

## 2023-03-01 ENCOUNTER — Other Ambulatory Visit (HOSPITAL_COMMUNITY): Payer: Self-pay

## 2023-03-02 ENCOUNTER — Other Ambulatory Visit (HOSPITAL_COMMUNITY): Payer: Self-pay

## 2023-03-14 ENCOUNTER — Encounter: Payer: Self-pay | Admitting: Family Medicine

## 2023-03-23 ENCOUNTER — Other Ambulatory Visit (HOSPITAL_COMMUNITY): Payer: Self-pay

## 2023-03-23 ENCOUNTER — Other Ambulatory Visit: Payer: Self-pay

## 2023-03-29 ENCOUNTER — Ambulatory Visit: Payer: Commercial Managed Care - PPO | Admitting: Family Medicine

## 2023-03-29 ENCOUNTER — Encounter: Payer: Self-pay | Admitting: Family Medicine

## 2023-03-29 ENCOUNTER — Other Ambulatory Visit (HOSPITAL_COMMUNITY): Payer: Self-pay

## 2023-03-29 VITALS — BP 126/72 | HR 85 | Temp 97.9°F | Ht 61.0 in | Wt 161.0 lb

## 2023-03-29 DIAGNOSIS — G894 Chronic pain syndrome: Secondary | ICD-10-CM | POA: Diagnosis not present

## 2023-03-29 DIAGNOSIS — M533 Sacrococcygeal disorders, not elsewhere classified: Secondary | ICD-10-CM | POA: Diagnosis not present

## 2023-03-29 MED ORDER — TRAMADOL HCL 50 MG PO TABS
ORAL_TABLET | ORAL | 0 refills | Status: DC
  Filled 2023-03-29: qty 120, 30d supply, fill #0

## 2023-03-29 NOTE — Progress Notes (Signed)
Subjective:    Patient ID: Katherine Tanner, female    DOB: 23-Apr-1961, 62 y.o.   MRN: [id number]  HPI  Wt Readings from Last 3 Encounters:  03/29/23 161 lb (73 kg)  02/27/23 159 lb (72.1 kg)  01/30/23 156 lb (70.8 kg)   30.42 kg/m  Vitals:   03/29/23 1218  BP: 126/72  Pulse: 85  Temp: 97.9 F (36.6 C)  SpO2: 100%   Pt presents with c/o right hip pain ( ? Back)  This suddenly worsened and does not know why      Insurance no longer pays for her to go out of [facility]  Can no longer return to ortho [facility] Blare) or the pain clinic    Exacerbation of old pain  Starts in buttock area / SI area  Is dull pain and constant  Sometimes shoots down her leg   Occational tingling of right foot-nothing new  No weakness of foot or leg   Worsened by standing  Worsened by turning over in bed  All of her joint problems are worsened by weather change   Improved by lying on left side or lying on abdomen   Still doing PT exercises   Using ice Using tens   Tramadol  Baclofen   Cymbalta   Used to take gabapentin for headaches Recently stopped     Last MRI of LS was 2023 MR LUMBAR SPINE WO CONTRAST (Accession [id number]) (Order [id number]) Imaging Date: 03/06/2022 Department: [facility] AT [address] Released By: [name] Ramus Authorizing: Verdon Cummins, MD   Exam Status  Status  Final [99]   PACS [facility] Image Link   Show images for MR LUMBAR SPINE WO CONTRAST Study Result  Narrative & Impression  CLINICAL DATA:  Low back pain radiating into bilateral buttocks   EXAM: MRI LUMBAR SPINE WITHOUT CONTRAST   TECHNIQUE: Multiplanar, multisequence MR imaging of the lumbar spine was performed. No intravenous contrast was administered.   COMPARISON:  07/12/2020   FINDINGS: Segmentation:  Standard.   Alignment: Anteroposterior alignment is maintained. Mild levocurvature.   Vertebrae: Vertebral body heights are maintained. No  marrow edema. No suspicious osseous lesion.   Conus medullaris and cauda equina: Conus extends to the L1-L2 level. Conus and cauda equina appear normal.   Paraspinal and other soft tissues: Possible fibroid uterus on the sagittal series.   Disc levels:   L1-L2:  No canal or foraminal stenosis.   L2-L3:  No canal or foraminal stenosis.   L3-L4:  Mild facet arthropathy.  No canal or foraminal stenosis.   L4-L5: Mild to moderate facet arthropathy with ligamentum flavum infolding. No canal or foraminal stenosis.   L5-S1: Disc bulge. Mild to moderate facet arthropathy. Prominent epidural fat partially effaces the thecal sac. No foraminal stenosis.   IMPRESSION: Mild, facet predominant degenerative changes as detailed above are similar to the prior study. No significant stenosis.   Possible fibroid uterus. This could be further evaluated with ultrasound.       Pt has history of chronic pain syndrome (knees and low back)   Tramadol  Baclofen Meloxicam  No longer going to pain clinic   Patient Active Problem List   Diagnosis Date Noted   SI (sacroiliac) pain 03/29/2023   Constipation 02/27/2023   Morbid obesity (HCC), Starting BMI 37.41 02/27/2023   Chronic migraine w/o aura w/o status migrainosus, not intractable 01/11/2023   Abnormal MRI of head 12/22/2022   Loss of balance 12/20/2022   Headache 12/19/2022  Menopause 07/25/2022   Generalized obesity 07/25/2022   Chronic pain syndrome 02/09/2022   Left ankle pain 01/12/2021   BMI 28.0-28.9,adult 08/17/2020   At risk for diabetes mellitus 07/27/2020   Nonalcoholic hepatosteatosis 07/06/2020   At risk for heart disease 07/06/2020   Transaminitis 04/07/2020   Thrombocytopenia (HCC) 07/08/2019   Insulin resistance 06/19/2019   Vitamin D deficiency 11/05/2018   Obstructive sleep apnea 10/10/2017   Primary osteoarthritis of left knee 12/30/2016   BMI 30.0-30.9,adult 05/24/2016   Prediabetes 02/24/2014    Osteoarthritis of right knee 11/15/2013   Osteoarthritis of left knee 11/15/2013   Alkaline phosphatase elevation 01/02/2012   Hyperlipidemia 01/02/2012   Routine general medical examination at a health care facility 12/09/2011   Allergy to influenza vaccine 05/02/2011   Chronic low back pain 03/08/2011   POLYARTHRITIS 01/28/2010   Chronic pain of both knees 12/11/2009   ANGIOEDEMA 03/27/2007   Essential hypertension 03/19/2007   Perennial allergic rhinitis with seasonal variation 03/19/2007   Asthma, moderate persistent 03/19/2007   GERD 03/19/2007   HIATAL HERNIA 03/19/2007   GESTATIONAL DIABETES 03/19/2007   Past Medical History:  Diagnosis Date   Allergy    takes Claritin daily and Flonase daily   Arthritis    Asthma    uses Albuterol daily as needed;Singulair nightly;DUlera daily   Back pain    Constipation    Fatty liver    Food allergy    GERD (gastroesophageal reflux disease)    occasionally will take OTC meds but states she just watches what she eats   H/O hiatal hernia    History of bronchitis    last time 76yrs ago   History of colon polyps    History of kidney stones    History of shingles    Hyperlipidemia    Hypertension    takes Hyzaar daily   Joint pain    Joint pain    Joint swelling    Obesity    Osteoarthritis    Pneumonia    hx of;last time at least 21yrs ago   PONV (postoperative nausea and vomiting)    Prediabetes    Shortness of breath    Sleep apnea    Swelling of lower extremity    Trouble in sleeping    Wheezing    Past Surgical History:  Procedure Laterality Date   BREAST EXCISIONAL BIOPSY Right    CARDIAC CATHETERIZATION     COLONOSCOPY     ESOPHAGOGASTRODUODENOSCOPY     KNEE SURGERY Right    arthroscopy   Right ganglion cyst     x 2   right lumpectomy  around 1983   STERIOD INJECTION Left 11/15/2013   Procedure: Marcaine/STEROID INJECTION;  Surgeon: Harvie Junior, MD;  Location: [facility] OR;  Service: Orthopedics;  Laterality:  Left;   TOTAL KNEE ARTHROPLASTY Right 11/15/2013   Procedure: RIGHT TOTAL KNEE ARTHROPLASTY;  Surgeon: Harvie Junior, MD;  Location: [facility] OR;  Service: Orthopedics;  Laterality: Right;   TOTAL KNEE ARTHROPLASTY Left 12/30/2016   Procedure: TOTAL KNEE ARTHROPLASTY;  Surgeon: Jodi Geralds, MD;  Location: [facility] OR;  Service: Orthopedics;  Laterality: Left;   TUBAL LIGATION     Social History   Tobacco Use   Smoking status: Never   Smokeless tobacco: Never  Vaping Use   Vaping status: Never Used  Substance Use Topics   Alcohol use: No    Alcohol/week: 0.0 standard drinks of alcohol   Drug use: No   Family History  Problem Relation Age of Onset   Diabetes Father    Cancer Father    Liver disease Father    Alcoholism Father    Sarcoidosis Mother    Glaucoma Mother    Hypertension Mother    Hyperlipidemia Mother    Sleep apnea Mother    Obesity Mother    Breast cancer Neg Hx    Allergies  Allergen Reactions   Ace Inhibitors Shortness Of Breath and Cough    Cough/ breathing problems    Amoxicillin-Pot Clavulanate Swelling    SWELLING REACTION UNSPECIFIED    Influenza Virus Vacc Split Pf     UNSPECIFIED REACTION    Shellfish Allergy Itching   Current Outpatient Medications on File Prior to Visit  Medication Sig Dispense Refill   albuterol (VENTOLIN HFA) 108 (90 Base) MCG/ACT inhaler Inhale 2 puffs into the lungs every 6 (six) hours as needed for wheezing or shortness of breath 6.7 g 12   baclofen (LIORESAL) 10 MG tablet Take 1 tablet (10 mg total) by mouth 2 (two) times daily as needed for muscle spasms (pain). Caution of sedation 60 tablet 3   DULoxetine (CYMBALTA) 60 MG capsule Take 1 capsule (60 mg) by mouth at bedtime. 90 capsule 2   fluticasone-salmeterol (ADVAIR DISKUS) 250-50 MCG/ACT AEPB Inhale 1 puff into the lungs 2 times daily then rinse mouth 60 each 10   ipratropium-albuterol (DUONEB) 0.5-2.5 (3) MG/3ML SOLN Inhale 1 vial via nebulizer every 6 (six) hours as needed. 360  mL 12   loratadine (CLARITIN) 10 MG tablet Take 10 mg by mouth daily.     losartan-hydrochlorothiazide (HYZAAR) 100-25 MG tablet TAKE 1 TABLET BY MOUTH ONCE DAILY 90 tablet 3   metFORMIN (GLUCOPHAGE) 500 MG tablet Take 1 tablet by mouth 2 times daily with meals. 180 tablet 0   montelukast (SINGULAIR) 10 MG tablet Take 1 tablet (10 mg total) by mouth at bedtime. 30 tablet 11   Multiple Vitamin (MULTIVITAMIN PO) Take 1 tablet by mouth daily.     Nebulizers (COMPRESSOR/NEBULIZER) MISC Use as directed 1 each 0   rizatriptan (MAXALT-MLT) 5 MG disintegrating tablet Take 1 tablet (5 mg total) by mouth as needed for migraine. May repeat in 2 hours if needed 10 tablet 6   rosuvastatin (CRESTOR) 10 MG tablet Take 1 tablet (10 mg total) by mouth daily. 90 tablet 3   No current facility-administered medications on file prior to visit.    Review of Systems  Constitutional:  Negative for activity change, appetite change, fatigue, fever and unexpected weight change.  HENT:  Negative for congestion, ear pain, rhinorrhea, sinus pressure and sore throat.   Eyes:  Negative for pain, redness and visual disturbance.  Respiratory:  Negative for cough, shortness of breath and wheezing.   Cardiovascular:  Negative for chest pain and palpitations.  Gastrointestinal:  Negative for abdominal pain, blood in stool, constipation and diarrhea.  Endocrine: Negative for polydipsia and polyuria.  Genitourinary:  Negative for dysuria, frequency and urgency.  Musculoskeletal:  Positive for arthralgias and back pain. Negative for myalgias.  Skin:  Negative for pallor and rash.  Allergic/Immunologic: Negative for environmental allergies.  Neurological:  Negative for dizziness, syncope and headaches.  Hematological:  Negative for adenopathy. Does not bruise/bleed easily.  Psychiatric/Behavioral:  Negative for decreased concentration and dysphoric mood. The patient is not nervous/anxious.        Objective:   Physical  Exam Constitutional:      General: She is not in acute distress.  Appearance: Normal appearance. She is obese. She is not ill-appearing or diaphoretic.  Eyes:     Conjunctiva/sclera: Conjunctivae normal.     Pupils: Pupils are equal, round, and reactive to light.  Cardiovascular:     Rate and Rhythm: Normal rate and regular rhythm.  Pulmonary:     Effort: Pulmonary effort is normal. No respiratory distress.  Musculoskeletal:     Cervical back: Neck supple.     Lumbar back: Spasms and tenderness present. No swelling. Negative right straight leg raise test and negative left straight leg raise test.     Comments: Tenderness in left piriformis / SI joint area  No swelling or deformity  Normal rom hip with some discomfort to fully internal rotate  Limited rom both knees -baseline   Neg SLR   Lymphadenopathy:     Cervical: No cervical adenopathy.  Skin:    General: Skin is warm and dry.     Findings: No bruising, erythema or rash.  Neurological:     Mental Status: She is alert.     Sensory: No sensory deficit.     Motor: No weakness.     Coordination: Coordination normal.     Gait: Gait normal.     Deep Tendon Reflexes: Reflexes normal.  Psychiatric:        Mood and Affect: Mood normal.           Assessment & Plan:   Problem List Items Addressed This Visit       Other   Chronic pain syndrome    Pt had to leave former pain clinic due to change in insurance   She may in the future need referral to pain clinic within [facility]  Refilled tramadol today       Relevant Medications   traMADol (ULTRAM) 50 MG tablet   SI (sacroiliac) pain - Primary    Right side  ? If acute or acute on chronic (of note happened when gabapentin formally given for headaches was stopped, and she does not want to go back on it_ Sometimes shoots down leg  Worse with position change  Needs to get est with a new ortho (last  was dr Luiz Blare , but now has to see [facility] due to  insurance)  Will put in referral  Reassuring exam today Given handout on SI and piriformis pain and also rehab  Encouraged heat and ice   Is already taking Tramadol Baclofen Cymbalta  For chronic oa pain   Call back and Er precautions noted in detail today          Relevant Orders   Ambulatory referral to Orthopedic Surgery

## 2023-03-29 NOTE — Assessment & Plan Note (Addendum)
Right side  ? If acute or acute on chronic (of note happened when gabapentin formally given for headaches was stopped, and she does not want to go back on it_ Sometimes shoots down leg  Worse with position change  Needs to get est with a new ortho (last  was dr Luiz Blare , but now has to see [facility] due to insurance)  Reviewed last LS MRI 2023  Will put in referral  Reassuring exam today Given handout on SI and piriformis pain and also rehab  Encouraged heat and ice   Is already taking Tramadol Baclofen Cymbalta  For chronic oa pain   Call back and Er precautions noted in detail today

## 2023-03-29 NOTE — Assessment & Plan Note (Signed)
Pt had to leave former pain clinic due to change in insurance   She may in the future need referral to pain clinic within [facility]  Refilled tramadol today

## 2023-03-29 NOTE — Patient Instructions (Addendum)
Alternate some ice and heat on your painful area  Do some stretching if it feels good   I put the referral in for ortho  Please let us know if you don't hear in 1-2 weeks    If symptoms change in the meantime let us know

## 2023-04-03 ENCOUNTER — Encounter (INDEPENDENT_AMBULATORY_CARE_PROVIDER_SITE_OTHER): Payer: Self-pay | Admitting: Adult Health

## 2023-04-03 ENCOUNTER — Ambulatory Visit (INDEPENDENT_AMBULATORY_CARE_PROVIDER_SITE_OTHER): Payer: Commercial Managed Care - PPO | Admitting: Adult Health

## 2023-04-03 VITALS — BP 114/70 | HR 94 | Temp 98.2°F | Ht 61.0 in | Wt 158.0 lb

## 2023-04-03 DIAGNOSIS — G894 Chronic pain syndrome: Secondary | ICD-10-CM | POA: Diagnosis not present

## 2023-04-03 DIAGNOSIS — Z6829 Body mass index (BMI) 29.0-29.9, adult: Secondary | ICD-10-CM | POA: Diagnosis not present

## 2023-04-03 DIAGNOSIS — E669 Obesity, unspecified: Secondary | ICD-10-CM

## 2023-04-03 DIAGNOSIS — E559 Vitamin D deficiency, unspecified: Secondary | ICD-10-CM | POA: Diagnosis not present

## 2023-04-03 DIAGNOSIS — I10 Essential (primary) hypertension: Secondary | ICD-10-CM | POA: Diagnosis not present

## 2023-04-03 NOTE — Progress Notes (Signed)
WEIGHT SUMMARY AND BIOMETRICS  Vitals Temp: 98.2 F (36.8 C) BP: 114/70 Pulse Rate: 94 SpO2: 98 %   Anthropometric Measurements Height: 5\' 1"  (1.549 m) Weight: 158 lb (71.7 kg) BMI (Calculated): 29.87 Weight at Last Visit: 159lb Weight Lost Since Last Visit: 1lb Weight Gained Since Last Visit: 0 Starting Weight: 198lb Peak Weight: 198lb   Body Composition  Body Fat %: 39.4 % Fat Mass (lbs): 62.2 lbs Muscle Mass (lbs): 91 lbs Total Body Water (lbs): 62.2 lbs Visceral Fat Rating : 10   Other Clinical Data Fasting: yes Labs: no Today's Visit #: 69 Starting Date: 08/30/18    Chief Complaint:   OBESITY Katherine Tanner is here to discuss her progress with her obesity treatment plan. She is on the the Category 2 Plan and states she is following her eating plan approximately 90 % of the time. She states she is exercising alternate between stationary bike and banded exercise 30 minutes 4-5 times per week.   Interim History:  Katherine Tanner recently weaned of Gabapentin- been completely off >7 days.  Due to lack of insurance coverage- she had to stop visits at her established Pain Clinic- PCP now manges pain medication regime.  Reviewed Bioimpedance results with pt: Muscle Mass: +1.6 lbs Adipose Mass: -3 lbs  Subjective:   1. Essential hypertension BP at goal at OV PCP manages daily Hyzaar 100/25mg   She has been alternating her exercise between stationary bike and banded exercises. Her muscle mass has increase since OV at [facility]  2. Vitamin D deficiency  Latest Reference Range & Units 12/12/22 11:39  Vitamin D, 25-Hydroxy 30.0 - 100.0 ng/mL 74.0   Level at goal  3. Chronic pain syndrome 03/29/2023 PCP OV  Pt had to leave former pain clinic due to change in insurance    She may in the future need referral to pain clinic [facility]  Refilled tramadol today          Relevant Medications    traMADol (ULTRAM) 50 MG tablet   PCP titrated her off  Gabapentin- been off completely for >7 days PDMP reviewed- last refill of Gabapentin was on 02/17/2023 Tramadol 50mg - last refill on 03/29/2023  Assessment/Plan:   1. Essential hypertension Continue regular exercise and daily Hyzaar  2. Vitamin D deficiency Monitor Labs  3. Chronic pain syndrome Continue with regular exercise and increased protein intake.  4. BMI 29.0-29.9,adult Current BMI 29.87  Katherine Tanner is currently in the action stage of change. As such, her goal is to continue with weight loss efforts. She has agreed to the Category 2 Plan.   Exercise goals: For substantial health benefits, adults should do at least 150 minutes (2 hours and 30 minutes) a week of moderate-intensity, or 75 minutes (1 hour and 15 minutes) a week of vigorous-intensity aerobic physical activity, or an equivalent combination of moderate- and vigorous-intensity aerobic activity. Aerobic activity should be performed in episodes of at least 10 minutes, and preferably, it should be spread throughout the week.  Behavioral modification strategies: increasing lean protein intake, decreasing simple carbohydrates, increasing vegetables, increasing water intake, no skipping meals, meal planning and cooking strategies, and planning for success.  Katherine Tanner has agreed to follow-up with our clinic in 4 weeks. She was informed of the importance of frequent follow-up visits to maximize her success with intensive lifestyle modifications for her multiple health conditions.   Objective:   Blood pressure 114/70, pulse 94, temperature 98.2 F (36.8 C), height 5\' 1"  (1.549 m), weight  158 lb (71.7 kg), last menstrual period 02/20/2014, SpO2 98%. Body mass index is 29.85 kg/m.  General: Cooperative, alert, well developed, in no acute distress. HEENT: Conjunctivae and lids unremarkable. Cardiovascular: Regular rhythm.  Lungs: Normal work of breathing. Neurologic: No focal deficits.   Lab Results  Component Value Date    CREATININE 0.63 12/12/2022   BUN 18 12/12/2022   NA 141 12/12/2022   K 4.2 12/12/2022   CL 100 12/12/2022   CO2 27 12/12/2022   Lab Results  Component Value Date   ALT 35 (H) 12/12/2022   AST 27 12/12/2022   ALKPHOS 118 12/12/2022   BILITOT 0.5 12/12/2022   Lab Results  Component Value Date   HGBA1C 5.6 12/12/2022   HGBA1C 5.8 07/11/2022   HGBA1C 5.6 03/28/2022   HGBA1C 5.6 12/20/2021   HGBA1C 5.6 09/28/2021   Lab Results  Component Value Date   INSULIN 7.5 12/12/2022   INSULIN 6.9 09/12/2022   INSULIN 6.7 03/28/2022   INSULIN 10.0 12/20/2021   INSULIN 3.7 09/28/2021   Lab Results  Component Value Date   TSH 0.931 12/12/2022   Lab Results  Component Value Date   CHOL 170 12/12/2022   HDL 76 12/12/2022   LDLCALC 85 12/12/2022   LDLDIRECT 147.9 12/17/2012   TRIG 42 12/12/2022   CHOLHDL 2 07/11/2022   Lab Results  Component Value Date   VD25OH 74.0 12/12/2022   VD25OH 42.04 07/11/2022   VD25OH 45.1 03/28/2022   Lab Results  Component Value Date   WBC 7.1 02/17/2023   HGB 11.9 (L) 02/17/2023   HCT 37.2 02/17/2023   MCV 89.4 02/17/2023   PLT 115 (L) 02/17/2023   Lab Results  Component Value Date   IRON 63 07/07/2020    Attestation Statements:   Reviewed by clinician on day of visit: allergies, medications, problem list, medical history, surgical history, family history, social history, and previous encounter notes.  Time spent on visit including pre-visit chart review and post-visit care and charting was 28 minutes.   I have reviewed the above documentation for accuracy and completeness, and I agree with the above. -  Malikiah Debarr d. Cherish Runde, NP-C

## 2023-04-10 ENCOUNTER — Other Ambulatory Visit (INDEPENDENT_AMBULATORY_CARE_PROVIDER_SITE_OTHER): Payer: Self-pay

## 2023-04-10 ENCOUNTER — Encounter: Payer: Self-pay | Admitting: Sports Medicine

## 2023-04-10 ENCOUNTER — Ambulatory Visit (INDEPENDENT_AMBULATORY_CARE_PROVIDER_SITE_OTHER): Payer: Commercial Managed Care - PPO | Admitting: Sports Medicine

## 2023-04-10 DIAGNOSIS — M47816 Spondylosis without myelopathy or radiculopathy, lumbar region: Secondary | ICD-10-CM

## 2023-04-10 DIAGNOSIS — G8929 Other chronic pain: Secondary | ICD-10-CM

## 2023-04-10 DIAGNOSIS — M533 Sacrococcygeal disorders, not elsewhere classified: Secondary | ICD-10-CM

## 2023-04-10 DIAGNOSIS — M7918 Myalgia, other site: Secondary | ICD-10-CM

## 2023-04-10 NOTE — Progress Notes (Signed)
Patient has had pain in the right SI area for years. She says that she was in pain management (originally for her knees) up until May but had to stop going as her insurance stopped covering it. She says that she has had injections in the past that helped but did not last. Patient states that her pain has worsened in the last couple of months; about one month ago she was unable to put pressure on her right foot -- this was the first time and has not happened since. Patient takes Tramadol, Baclofen, and Cymbalta; patient's doctor had her discontinue Meloxicam.

## 2023-04-10 NOTE — Progress Notes (Signed)
Katherine Tanner - 62 y.o. female MRN [id number]  Date of birth: 26-Feb-1961  Office Visit Note: Visit Date: 04/10/2023 PCP: Judy Pimple, MD Referred by: Tower, Audrie Gallus, MD  Subjective: Chief Complaint  Patient presents with   Right Hip - Pain   Lower Back - Pain   HPI: Katherine Tanner is a very pleasant 62 y.o. female who presents today for acute on chronic right-sided low back and SI joint pain.  She is a Runner, broadcasting/film/video, works in the lab.  She has had right-sided low back and SI joint pain for a number of years, although this has been significantly worsening over the past 2 or 3 months.  He previously followed with pain management but had to stop this due to insurance changes not covering the specialty.  She has had previous injections in the past with relief but not long-lasting.  She currently is taking tramadol, baclofen and Cymbalta.  Her PCP discontinued her meloxicam.  Here over the last few months when has been worsening, she has had issue putting pressure on her right foot.  She did perform an extensive amount of physical therapy last year, but has not been ending this year.  It only helped to some extent, did note improvement with flexibility.  Pertinent ROS were reviewed with the patient and found to be negative unless otherwise specified above in HPI.   Assessment & Plan: Visit Diagnoses:  1. Chronic right SI joint pain   2. Pain in right buttock   3. Lumbar facet arthropathy    Plan: Discussed with [name] that she does seem to be having an exacerbation of her chronic right-sided low back and SI joint pain.  She is on a fairly significant pharmacologic management with tramadol 50 mg a.m., 100 mg nightly, Cymbalta 60 mg daily, and baclofen 10 mg as needed.  Her pain has been worsened over the past 2 months or so and she has received relief in the past with injections.  We will bring her back to get set up for ultrasound-guided right SI joint injection.   She also does have some facet arthropathy, we will start with a SI joint injection, but if for some reason this does not give her significant relief, could consider referral to my partner, Dr. Alvester Morin for facet injections.  Would likely get her started in some PT or home rehab for the SI joint and posterior spine as well going forward.  Follow-up: Return in about 1 week (around 04/17/2023) for for US-guided R-SI joint injection (30-mins).   Meds & Orders: No orders of the defined types were placed in this encounter.   Orders Placed This Encounter  Procedures   XR Lumbar Spine Complete     Procedures: No procedures performed      Clinical History: No specialty comments available.  She reports that she has never smoked. She has never used smokeless tobacco.  Recent Labs    07/11/22 0754 12/12/22 1139  HGBA1C 5.8 5.6    Objective:   Vital Signs: LMP 02/20/2014   Physical Exam  Gen: Well-appearing, in no acute distress; non-toxic CV: Well-perfused. Warm.  Resp: Breathing unlabored on room air; no wheezing. Psych: Fluid speech in conversation; appropriate affect; normal thought process Neuro: Sensation intact throughout. No gross coordination deficits.   Ortho Exam - Lumbar: There is no midline spinous process TTP.  Positive TTP over the right SI joint region, positive Fortin's point test, positive SI joint compression and Gaenslen's test.  There  is full range of motion in flexion and extension of the lumbar spine, but extension does worsen her pain.  5/5 strength of bilateral lower extremities.  Imaging:  XR Lumbar Spine Complete 4 views of the lumbar spine including AP, lateral, flexion and extension  views were ordered and reviewed by myself.  X-ray demonstrates only  minimal degenerative disc disease, mild at the L5 versus S1 level. There  is no instability with flexion or extension views.  X-ray demonstrates  mild to moderate facet arthropathy at the L4 and L5 level.  No  bony  fracture or acute bony abnormality otherwise noted.  *Independent review and interpretation of MRI of the lumbar spine from 03/06/2022 was performed today.  MRI shows facet arthropathy at the L4 and L5 region.  There is a small posterior disc bulge at the L5-S1 level without neural impingement.  There is some mild SI joint arthritic change but certainly not advanced bilaterally.  Narrative & Impression  CLINICAL DATA:  Low back pain radiating into bilateral buttocks   EXAM: MRI LUMBAR SPINE WITHOUT CONTRAST   TECHNIQUE: Multiplanar, multisequence MR imaging of the lumbar spine was performed. No intravenous contrast was administered.   COMPARISON:  07/12/2020   FINDINGS: Segmentation:  Standard.   Alignment: Anteroposterior alignment is maintained. Mild levocurvature.   Vertebrae: Vertebral body heights are maintained. No marrow edema. No suspicious osseous lesion.   Conus medullaris and cauda equina: Conus extends to the L1-L2 level. Conus and cauda equina appear normal.   Paraspinal and other soft tissues: Possible fibroid uterus on the sagittal series.   Disc levels:   L1-L2:  No canal or foraminal stenosis.   L2-L3:  No canal or foraminal stenosis.   L3-L4:  Mild facet arthropathy.  No canal or foraminal stenosis.   L4-L5: Mild to moderate facet arthropathy with ligamentum flavum infolding. No canal or foraminal stenosis.   L5-S1: Disc bulge. Mild to moderate facet arthropathy. Prominent epidural fat partially effaces the thecal sac. No foraminal stenosis.   IMPRESSION: Mild, facet predominant degenerative changes as detailed above are similar to the prior study. No significant stenosis.   Possible fibroid uterus. This could be further evaluated with ultrasound.     Electronically Signed   By: Guadlupe Spanish M.D.   On: 03/08/2022 12:12    Past Medical/Family/Surgical/Social History: Medications & Allergies reviewed per EMR, new medications  updated. Patient Active Problem List   Diagnosis Date Noted   SI (sacroiliac) pain 03/29/2023   Constipation 02/27/2023   Morbid obesity (HCC), Starting BMI 37.41 02/27/2023   Chronic migraine w/o aura w/o status migrainosus, not intractable 01/11/2023   Abnormal MRI of head 12/22/2022   Loss of balance 12/20/2022   Headache 12/19/2022   Menopause 07/25/2022   Generalized obesity 07/25/2022   Chronic pain syndrome 02/09/2022   Left ankle pain 01/12/2021   BMI 28.0-28.9,adult 08/17/2020   At risk for diabetes mellitus 07/27/2020   Nonalcoholic hepatosteatosis 07/06/2020   At risk for heart disease 07/06/2020   Transaminitis 04/07/2020   Thrombocytopenia (HCC) 07/08/2019   Insulin resistance 06/19/2019   Vitamin D deficiency 11/05/2018   Obstructive sleep apnea 10/10/2017   Primary osteoarthritis of left knee 12/30/2016   BMI 30.0-30.9,adult 05/24/2016   Prediabetes 02/24/2014   Osteoarthritis of right knee 11/15/2013   Osteoarthritis of left knee 11/15/2013   Alkaline phosphatase elevation 01/02/2012   Hyperlipidemia 01/02/2012   Routine general medical examination at a health care facility 12/09/2011   Allergy to influenza vaccine 05/02/2011  Chronic low back pain 03/08/2011   POLYARTHRITIS 01/28/2010   Chronic pain of both knees 12/11/2009   ANGIOEDEMA 03/27/2007   Essential hypertension 03/19/2007   Perennial allergic rhinitis with seasonal variation 03/19/2007   Asthma, moderate persistent 03/19/2007   GERD 03/19/2007   HIATAL HERNIA 03/19/2007   GESTATIONAL DIABETES 03/19/2007   Past Medical History:  Diagnosis Date   Allergy    takes Claritin daily and Flonase daily   Arthritis    Asthma    uses Albuterol daily as needed;Singulair nightly;DUlera daily   Back pain    Constipation    Fatty liver    Food allergy    GERD (gastroesophageal reflux disease)    occasionally will take OTC meds but states she just watches what she eats   H/O hiatal hernia     History of bronchitis    last time 22yrs ago   History of colon polyps    History of kidney stones    History of shingles    Hyperlipidemia    Hypertension    takes Hyzaar daily   Joint pain    Joint pain    Joint swelling    Obesity    Osteoarthritis    Pneumonia    hx of;last time at least 29yrs ago   PONV (postoperative nausea and vomiting)    Prediabetes    Shortness of breath    Sleep apnea    Swelling of lower extremity    Trouble in sleeping    Wheezing    Family History  Problem Relation Age of Onset   Diabetes Father    Cancer Father    Liver disease Father    Alcoholism Father    Sarcoidosis Mother    Glaucoma Mother    Hypertension Mother    Hyperlipidemia Mother    Sleep apnea Mother    Obesity Mother    Breast cancer Neg Hx    Past Surgical History:  Procedure Laterality Date   BREAST EXCISIONAL BIOPSY Right    CARDIAC CATHETERIZATION     COLONOSCOPY     ESOPHAGOGASTRODUODENOSCOPY     KNEE SURGERY Right    arthroscopy   Right ganglion cyst     x 2   right lumpectomy  around 1983   STERIOD INJECTION Left 11/15/2013   Procedure: Marcaine/STEROID INJECTION;  Surgeon: Harvie Junior, MD;  Location: [facility] OR;  Service: Orthopedics;  Laterality: Left;   TOTAL KNEE ARTHROPLASTY Right 11/15/2013   Procedure: RIGHT TOTAL KNEE ARTHROPLASTY;  Surgeon: Harvie Junior, MD;  Location: [facility] OR;  Service: Orthopedics;  Laterality: Right;   TOTAL KNEE ARTHROPLASTY Left 12/30/2016   Procedure: TOTAL KNEE ARTHROPLASTY;  Surgeon: Jodi Geralds, MD;  Location: [facility] OR;  Service: Orthopedics;  Laterality: Left;   TUBAL LIGATION     Social History   Occupational History   Occupation: Medical laboratory scientific officer:     Employer: [employer]  Tobacco Use   Smoking status: Never   Smokeless tobacco: Never  Vaping Use   Vaping status: Never Used  Substance and Sexual Activity   Alcohol use: No    Alcohol/week: 0.0 standard drinks of alcohol   Drug use: No   Sexual  activity: Yes    Birth control/protection: None

## 2023-04-18 ENCOUNTER — Other Ambulatory Visit (HOSPITAL_COMMUNITY): Payer: Self-pay

## 2023-04-21 ENCOUNTER — Encounter: Payer: Self-pay | Admitting: Sports Medicine

## 2023-04-21 ENCOUNTER — Ambulatory Visit: Payer: Commercial Managed Care - PPO | Admitting: Sports Medicine

## 2023-04-21 ENCOUNTER — Other Ambulatory Visit: Payer: Self-pay

## 2023-04-21 DIAGNOSIS — M533 Sacrococcygeal disorders, not elsewhere classified: Secondary | ICD-10-CM | POA: Diagnosis not present

## 2023-04-21 DIAGNOSIS — G8929 Other chronic pain: Secondary | ICD-10-CM | POA: Diagnosis not present

## 2023-04-21 DIAGNOSIS — M7918 Myalgia, other site: Secondary | ICD-10-CM

## 2023-04-21 NOTE — Progress Notes (Signed)
Procedure Note  Patient: Katherine Tanner             Date of Birth: [birth year]           MRN: [id number]             Visit Date: 04/21/2023  Procedures: Visit Diagnoses:  1. Chronic right SI joint pain   2. Pain in right buttock    U/S-guided SI-joint injection, Right   After discussion of risk/benefits/indications, informed verbal consent was obtained. A timeout was then performed. The patient was positioned in a prone position on exam room table with a pillow placed under the pelvis for mild hip flexion. The SI joint area was cleaned and prepped with betadine and alcohol swabs. Sterile ultrasound gel was applied and the ultrasound transducer was placed in an anatomic axial plane over the PSIS, then moved distally over the SI-joint. Using ultrasound guidance, a 22-gauge, 3.5" needle was inserted from a medial to lateral approach utilizing an in-plane approach and directed into the SI-joint. The SI-joint was then injected with a mixture of 4:2 lidocaine:depomedrol with visualization of the injectate flow into the SI-joint under ultrasound visualization. The patient tolerated the procedure well without immediate complications.  - she will notify me over the next 1-2 weeks of her progress --> if for some reason, not having much relief, could consider referral to my partner, Dr. Alvester Morin, for facet injections vs. MRI of lumbar spine - she had a good management plan with prior pain management physician, but had to discontinue as insurance changed and did not cover meds - could benefit from pain management referral in network  Madelyn Brunner, DO Primary Care Sports Medicine Physician  [facility] - Orthopedics  This note was dictated using Dragon naturally speaking software and may contain errors in syntax, spelling, or content which have not been identified prior to signing this note.

## 2023-04-29 ENCOUNTER — Other Ambulatory Visit: Payer: Self-pay | Admitting: Family Medicine

## 2023-05-01 ENCOUNTER — Other Ambulatory Visit (HOSPITAL_COMMUNITY): Payer: Self-pay

## 2023-05-01 ENCOUNTER — Other Ambulatory Visit: Payer: Self-pay

## 2023-05-01 MED ORDER — TRAMADOL HCL 50 MG PO TABS
ORAL_TABLET | ORAL | 0 refills | Status: DC
  Filled 2023-05-01: qty 120, 30d supply, fill #0

## 2023-05-01 NOTE — Telephone Encounter (Signed)
Refill request for traMADol (ULTRAM) 50 MG tablet   LOV - 03/29/23 Next OV - not scheduled Last refill - 03/29/23 #120/0

## 2023-05-04 ENCOUNTER — Other Ambulatory Visit (HOSPITAL_COMMUNITY): Payer: Self-pay

## 2023-05-08 ENCOUNTER — Other Ambulatory Visit (HOSPITAL_COMMUNITY): Payer: Self-pay

## 2023-05-08 ENCOUNTER — Ambulatory Visit (INDEPENDENT_AMBULATORY_CARE_PROVIDER_SITE_OTHER): Payer: Commercial Managed Care - PPO | Admitting: Physician Assistant

## 2023-05-08 ENCOUNTER — Encounter (INDEPENDENT_AMBULATORY_CARE_PROVIDER_SITE_OTHER): Payer: Self-pay | Admitting: Physician Assistant

## 2023-05-08 VITALS — BP 133/80 | HR 77 | Temp 98.3°F | Ht 61.0 in | Wt 158.0 lb

## 2023-05-08 DIAGNOSIS — E559 Vitamin D deficiency, unspecified: Secondary | ICD-10-CM

## 2023-05-08 DIAGNOSIS — I10 Essential (primary) hypertension: Secondary | ICD-10-CM

## 2023-05-08 DIAGNOSIS — E785 Hyperlipidemia, unspecified: Secondary | ICD-10-CM | POA: Diagnosis not present

## 2023-05-08 DIAGNOSIS — E7800 Pure hypercholesterolemia, unspecified: Secondary | ICD-10-CM | POA: Diagnosis not present

## 2023-05-08 DIAGNOSIS — R7303 Prediabetes: Secondary | ICD-10-CM

## 2023-05-08 DIAGNOSIS — Z6829 Body mass index (BMI) 29.0-29.9, adult: Secondary | ICD-10-CM | POA: Diagnosis not present

## 2023-05-08 MED ORDER — METFORMIN HCL 500 MG PO TABS
500.0000 mg | ORAL_TABLET | Freq: Two times a day (BID) | ORAL | 0 refills | Status: DC
Start: 2023-05-08 — End: 2023-08-16
  Filled 2023-05-08 – 2023-06-05 (×2): qty 180, 90d supply, fill #0

## 2023-05-08 NOTE — Progress Notes (Signed)
.smr  Office: [phone]  /  Fax: [phone]  WEIGHT SUMMARY AND BIOMETRICS  Vitals Temp: 98.3 F (36.8 C) BP: 133/80 Pulse Rate: 77 SpO2: 100 %   Anthropometric Measurements Height: 5\' 1"  (1.549 m) Weight: 158 lb (71.7 kg) BMI (Calculated): 29.87 Weight at Last Visit: 158 lb Weight Lost Since Last Visit: 0 Weight Gained Since Last Visit: 0 Starting Weight: 198 lb Total Weight Loss (lbs): 40 lb (18.1 kg) Peak Weight: 198 lb   Body Composition  Body Fat %: 37.2 % Fat Mass (lbs): 59 lbs Muscle Mass (lbs): 94.8 lbs Total Body Water (lbs): 63.4 lbs Visceral Fat Rating : 9   Other Clinical Data Fasting: yes Labs: yes Today's Visit #: 3 Starting Date: 08/30/18     HPI  Chief Complaint: OBESITY  Katherine Tanner is here to discuss her progress with her obesity treatment plan. She is on the the Category 2 Plan and states she is following her eating plan approximately 75 % of the time. She states she is exercising 0 minutes 0 times per week. Discussed the use of AI scribe software for clinical note transcription with the patient, who gave verbal consent to proceed.  History of Present Illness  /   Interval History:  Since last office visit she maintained her weight.  Bio impedence scale reviewed with the patient:  Muscle mass + 3.8 lbs.  Adipose mass -3.2 lbs Total body water + 1.2 lbs.   The patient, with a history of obesity, prediabetes, vitamin D deficiency, and hypertension, presents for a follow-up visit.  She reports that her husband is currently in rehab following a CVA and she is managing her care, which has been stressful.  Despite the stress, she reports that her energy level is okay.  She is considering early retirement to focus on her husband's care and her own health.  In terms of her obesity treatment plan, she has been walking a lot due to her husband's hospitalization but has not been doing any other form of exercise.  She reports that she has been  eating the same but her schedule is a bit different due to her current circumstances.  She has been taking over-the-counter vitamin D and is on metformin for her prediabetes. She reports no changes in her medication regimen.  Celebrated 33 years with [facility] 03/27/23 !   Fasting labs obtained today in follow up .  She was informed we would discuss her lab results at her next visit unless there is a critical issue that needs to be addressed sooner. She agreed to keep her next visit at the agreed upon time to discuss these results.   Pharmacotherapy: metformin for prediabetes. No GI upset or other side effects with metformin.   Due to lack of insurance coverage- she had to stop visits at her established Pain Clinic- PCP now manges pain medication regime.   TREATMENT PLAN FOR OBESITY: Obesity Continued progress in weight loss with an increase in muscle mass and decrease in adipose tissue. Patient is managing stress and maintaining self-care routines despite personal challenges. -Continue current weight loss regimen. -Check labs including electrolytes, liver, kidney function, A1C, insulin level, and fasting lipid panel. Recommended Dietary Goals  Katherine Tanner is currently in the action stage of change. As such, her goal is to continue weight management plan. She has agreed to the Category 2 Plan.  Behavioral Intervention  We discussed the following Behavioral Modification Strategies today: continue to work on maintaining a reduced calorie state, getting the  recommended amount of protein, incorporating whole foods, making healthy choices, staying well hydrated and practicing mindfulness when eating..  Additional resources provided today: NA  Recommended Physical Activity Goals  Katherine Tanner has been advised to work up to 150 minutes of moderate intensity aerobic activity a week and strengthening exercises 2-3 times per week for cardiovascular health, weight loss maintenance and preservation of muscle  mass.   She has agreed to Continue current level of physical activity  and Increase physical activity in their day and reduce sedentary time (increase NEAT).   Pharmacotherapy We discussed various medication options to help Katherine Tanner with her weight loss efforts and we both agreed to continue metformin for prediabetes.    Return in about 6 weeks (around 06/19/2023).Marland KitchenShe may need to change to a video visit depending on assistance if her husband has returned home.   She was informed of the importance of frequent follow up visits to maximize her success with intensive lifestyle modifications for her multiple health conditions.  PHYSICAL EXAM:  Blood pressure 133/80, pulse 77, temperature 98.3 F (36.8 C), height 5\' 1"  (1.549 m), weight 158 lb (71.7 kg), last menstrual period 02/20/2014, SpO2 100%. Body mass index is 29.85 kg/m.  General: She is overweight, cooperative, alert, well developed, and in no acute distress. PSYCH: Has normal mood, affect and thought process.   Cardiovascular: HR 70's BO 133/80- at little higher than usual and will monitor.  Lungs: Normal breathing effort, no conversational dyspnea. Neuro: no focal deficits  DIAGNOSTIC DATA REVIEWED:  BMET    Component Value Date/Time   NA 141 12/12/2022 1139   K 4.2 12/12/2022 1139   CL 100 12/12/2022 1139   CO2 27 12/12/2022 1139   GLUCOSE 98 12/12/2022 1139   GLUCOSE 83 11/18/2022 1447   BUN 18 12/12/2022 1139   CREATININE 0.63 12/12/2022 1139   CALCIUM 9.9 12/12/2022 1139   GFRNONAA >60 11/18/2022 1447   GFRAA 123 03/23/2020 1306   Lab Results  Component Value Date   HGBA1C 5.6 12/12/2022   HGBA1C 6.2 02/24/2014   Lab Results  Component Value Date   INSULIN 7.5 12/12/2022   INSULIN 9.3 08/30/2018   Lab Results  Component Value Date   TSH 0.931 12/12/2022   CBC    Component Value Date/Time   WBC 7.1 02/17/2023 1450   WBC 4.6 12/19/2022 1055   RBC 4.16 02/17/2023 1450   HGB 11.9 (L) 02/17/2023 1450    HGB 12.9 12/24/2019 1627   HCT 37.2 02/17/2023 1450   HCT 40.2 12/24/2019 1627   PLT 115 (L) 02/17/2023 1450   PLT 135 (L) 12/24/2019 1627   MCV 89.4 02/17/2023 1450   MCV 87 12/24/2019 1627   MCH 28.6 02/17/2023 1450   MCHC 32.0 02/17/2023 1450   RDW 13.5 02/17/2023 1450   RDW 13.2 12/24/2019 1627   Iron Studies    Component Value Date/Time   IRON 63 07/07/2020 1334   Lipid Panel     Component Value Date/Time   CHOL 170 12/12/2022 1139   TRIG 42 12/12/2022 1139   HDL 76 12/12/2022 1139   CHOLHDL 2 07/11/2022 0754   VLDL 6.4 07/11/2022 0754   LDLCALC 85 12/12/2022 1139   LDLDIRECT 147.9 12/17/2012 1604   Hepatic Function Panel     Component Value Date/Time   PROT 7.0 12/12/2022 1139   ALBUMIN 4.5 12/12/2022 1139   AST 27 12/12/2022 1139   ALT 35 (H) 12/12/2022 1139   ALKPHOS 118 12/12/2022 1139   BILITOT  0.5 12/12/2022 1139   BILIDIR 0.1 01/02/2012 1626   IBILI 0.3 01/05/2010 2038      Component Value Date/Time   TSH 0.931 12/12/2022 1139   Nutritional Lab Results  Component Value Date   VD25OH 74.0 12/12/2022   VD25OH 42.04 07/11/2022   VD25OH 45.1 03/28/2022    ASSOCIATED CONDITIONS ADDRESSED TODAY  ASSESSMENT AND PLAN  Problem List Items Addressed This Visit     Essential hypertension - Primary   Hyperlipidemia   Relevant Orders   Lipid Panel With LDL/HDL Ratio   Prediabetes   Relevant Medications   metFORMIN (GLUCOPHAGE) 500 MG tablet   Other Relevant Orders   CMP14+EGFR   Hemoglobin A1c   Insulin, random   Vitamin B12   Vitamin D deficiency   Relevant Orders   VITAMIN D 25 Hydroxy (Vit-D Deficiency, Fractures)   BMI 29.0-29.9,adult   Morbid obesity (HCC), Starting BMI 37.41   Relevant Medications   metFORMIN (GLUCOPHAGE) 500 MG tablet    Hypertension Elevated blood pressure likely due to stress. On Hyzaar 100/25 mg daily.  No side effects with medication.  Renal function is normal range.  -Continue current management and  monitor blood pressure.Continue to work on nutrition plan to promote weight loss and improve BP control.  Recheck labs today.   Prediabetes Patient is on Metformin 500 mg BID. No side effects with metformin, and has been maintaining dietary habits. -Refill/continue Metformin 500 mg daily. prescription. -Check A1C and insulin levels, lipids.  Vitamin D deficiency Patient is on over-the-counter Vitamin D. Some fatigue.  -Continue current regimen with OTC vitamin D. Low vitamin D levels can be associated with adiposity and may result in leptin resistance and weight gain. Also associated with fatigue. Currently on vitamin D supplementation without any adverse effects.  Recheck vitamin D level and B12 levels. .  Hyperlipidemia LDL is at goal. Medication(s): Crestor 10 daily. No reported side effects with Crestor.  Cardiovascular risk factors: dyslipidemia, hypertension, and obesity (BMI >= 30 kg/m2)  Lab Results  Component Value Date   CHOL 170 12/12/2022   HDL 76 12/12/2022   LDLCALC 85 12/12/2022   LDLDIRECT 147.9 12/17/2012   TRIG 42 12/12/2022   CHOLHDL 2 07/11/2022   CHOLHDL 2 07/09/2021   CHOLHDL 2.3 03/22/2021   Lab Results  Component Value Date   ALT 35 (H) 12/12/2022   AST 27 12/12/2022   ALKPHOS 118 12/12/2022   BILITOT 0.5 12/12/2022   The 10-year ASCVD risk score (Arnett DK, et al., 2019) is: 5.4%   Values used to calculate the score:     Age: 14 years     Sex: Female     Is Non-Hispanic African American: Yes     Diabetic: No     Tobacco smoker: No     Systolic Blood Pressure: 133 mmHg     Is BP treated: Yes     HDL Cholesterol: 76 mg/dL     Total Cholesterol: 170 mg/dL  Plan: Continue statin. Continue to work on nutrition plan -decreasing simple carbohydrates, increasing lean proteins, decreasing saturated fats and cholesterol , avoiding trans fats and exercise as able to promote weight loss, improve lipids and decrease cardiovascular risks. Recheck fasting  lipids today.     General Health Maintenance Schedule follow-up appointment in 6 weeks, with the option for a video visit if needed.  ATTESTASTION STATEMENTS:  Reviewed by clinician on day of visit: allergies, medications, problem list, medical history, surgical history, family history, social history, and previous  encounter notes.   I have personally spent 38 minutes total time today in preparation, patient care, nutritional counseling and documentation for this visit, including the following: review of clinical lab tests; review of medical tests/procedures/services.      Kalaysia Demonbreun, PA-C

## 2023-05-09 ENCOUNTER — Encounter (INDEPENDENT_AMBULATORY_CARE_PROVIDER_SITE_OTHER): Payer: Self-pay | Admitting: Physician Assistant

## 2023-05-09 LAB — CMP14+EGFR
ALT: 33 [IU]/L — ABNORMAL HIGH (ref 0–32)
AST: 28 [IU]/L (ref 0–40)
Albumin: 4.4 g/dL (ref 3.8–4.9)
Alkaline Phosphatase: 110 [IU]/L (ref 44–121)
BUN/Creatinine Ratio: 23 (ref 12–28)
BUN: 14 mg/dL (ref 8–27)
Bilirubin Total: 0.4 mg/dL (ref 0.0–1.2)
CO2: 30 mmol/L — ABNORMAL HIGH (ref 20–29)
Calcium: 9.9 mg/dL (ref 8.7–10.3)
Chloride: 99 mmol/L (ref 96–106)
Creatinine, Ser: 0.61 mg/dL (ref 0.57–1.00)
Globulin, Total: 2.5 g/dL (ref 1.5–4.5)
Glucose: 88 mg/dL (ref 70–99)
Potassium: 4.1 mmol/L (ref 3.5–5.2)
Sodium: 141 mmol/L (ref 134–144)
Total Protein: 6.9 g/dL (ref 6.0–8.5)
eGFR: 102 mL/min/{1.73_m2} (ref >59)

## 2023-05-09 LAB — LIPID PANEL WITH LDL/HDL RATIO
Cholesterol, Total: 139 mg/dL (ref 100–199)
HDL: 68 mg/dL (ref >39)
LDL Chol Calc (NIH): 62 mg/dL (ref 0–99)
LDL/HDL Ratio: 0.9 ratio (ref 0.0–3.2)
Triglycerides: 38 mg/dL (ref 0–149)
VLDL Cholesterol Cal: 9 mg/dL (ref 5–40)

## 2023-05-09 LAB — HEMOGLOBIN A1C
Est. average glucose Bld gHb Est-mCnc: 128 mg/dL
Hgb A1c MFr Bld: 6.1 % — ABNORMAL HIGH (ref 4.8–5.6)

## 2023-05-09 LAB — INSULIN, RANDOM: INSULIN: 6.2 u[IU]/mL (ref 2.6–24.9)

## 2023-05-09 LAB — VITAMIN B12: Vitamin B-12: 1040 pg/mL (ref 232–1245)

## 2023-05-09 LAB — VITAMIN D 25 HYDROXY (VIT D DEFICIENCY, FRACTURES): Vit D, 25-Hydroxy: 122 ng/mL — ABNORMAL HIGH (ref 30.0–100.0)

## 2023-05-10 NOTE — Telephone Encounter (Signed)
Call to patient to make her aware of stopping all Vitamin D supplements.  Patient verbalized understanding.  Call ended.  No further issues.

## 2023-05-15 ENCOUNTER — Other Ambulatory Visit (HOSPITAL_COMMUNITY): Payer: Self-pay

## 2023-05-15 ENCOUNTER — Ambulatory Visit
Admission: RE | Admit: 2023-05-15 | Discharge: 2023-05-15 | Disposition: A | Discharge status: Home / Self Care | Payer: Commercial Managed Care - PPO | Source: Ambulatory Visit | Attending: Family Medicine | Admitting: Family Medicine

## 2023-05-15 DIAGNOSIS — R921 Mammographic calcification found on diagnostic imaging of breast: Secondary | ICD-10-CM | POA: Diagnosis not present

## 2023-05-16 ENCOUNTER — Other Ambulatory Visit (HOSPITAL_COMMUNITY): Payer: Self-pay

## 2023-05-17 ENCOUNTER — Other Ambulatory Visit (HOSPITAL_COMMUNITY): Payer: Self-pay

## 2023-05-19 ENCOUNTER — Encounter: Payer: Self-pay | Admitting: Internal Medicine

## 2023-05-19 NOTE — Telephone Encounter (Signed)
Sorry about your husband ! For thick white mucus, not sure there is a role for an antibiotic. I can send one if you want. Antihistamines will just make it thicker. Try drinking more water, maybe stand a little in a steamy shower. Continue the Mucinex.

## 2023-05-21 NOTE — Progress Notes (Unsigned)
[facility] CONSULT NOTE  Patient Care Team: Tower, Audrie Gallus, MD as PCP - General Earna Coder, MD as Consulting Physician (Internal Medicine)  CHIEF COMPLAINTS/PURPOSE OF CONSULTATION: Thrombocytopenia  # THROMBOCYTOPENIA: [since 2019 > 100]; 2023- 70-80s; Normal- WBC/platelets [Dr.Shadad; last May 2022]; HIV/hepatitis negative as per pt [[facility]- dx:PN]  #Intentional weight loss [~50 pounds over the last 1 year]  HISTORY OF PRESENTING ILLNESS: Ambulating independently.  Alone.  Katherine Tanner 62 y.o.  female patient with longstanding history of intermittent thrombocytopenia clinically ITP is here for follow-up.  Patient denies any significant bleeding.  No blood in stools or black-colored stools.  Review of Systems  Constitutional:  Positive for malaise/fatigue. Negative for chills, diaphoresis, fever and weight loss.  HENT:  Negative for nosebleeds and sore throat.   Eyes:  Negative for double vision.  Respiratory:  Negative for cough, hemoptysis, sputum production, shortness of breath and wheezing.   Cardiovascular:  Negative for chest pain, palpitations, orthopnea and leg swelling.  Gastrointestinal:  Negative for abdominal pain, blood in stool, constipation, diarrhea, heartburn, melena, nausea and vomiting.  Genitourinary:  Negative for dysuria, frequency and urgency.  Musculoskeletal:  Positive for back pain and joint pain.  Skin: Negative.  Negative for itching and rash.  Neurological:  Negative for dizziness, tingling, focal weakness, weakness and headaches.  Endo/Heme/Allergies:  Does not bruise/bleed easily.  Psychiatric/Behavioral:  Negative for depression. The patient is not nervous/anxious and does not have insomnia.      MEDICAL HISTORY:  Past Medical History:  Diagnosis Date   Allergy    takes Claritin daily and Flonase daily   Arthritis    Asthma    uses Albuterol daily as needed;Singulair nightly;DUlera daily   Back  pain    Constipation    Fatty liver    Food allergy    GERD (gastroesophageal reflux disease)    occasionally will take OTC meds but states she just watches what she eats   H/O hiatal hernia    History of bronchitis    last time 66yrs ago   History of colon polyps    History of kidney stones    History of shingles    Hyperlipidemia    Hypertension    takes Hyzaar daily   Joint pain    Joint pain    Joint swelling    Obesity    Osteoarthritis    Pneumonia    hx of;last time at least 50yrs ago   PONV (postoperative nausea and vomiting)    Prediabetes    Shortness of breath    Sleep apnea    Swelling of lower extremity    Trouble in sleeping    Wheezing     SURGICAL HISTORY: Past Surgical History:  Procedure Laterality Date   BREAST EXCISIONAL BIOPSY Right    CARDIAC CATHETERIZATION     COLONOSCOPY     ESOPHAGOGASTRODUODENOSCOPY     KNEE SURGERY Right    arthroscopy   Right ganglion cyst     x 2   right lumpectomy  around 1983   STERIOD INJECTION Left 11/15/2013   Procedure: Marcaine/STEROID INJECTION;  Surgeon: Harvie Junior, MD;  Location: [facility] OR;  Service: Orthopedics;  Laterality: Left;   TOTAL KNEE ARTHROPLASTY Right 11/15/2013   Procedure: RIGHT TOTAL KNEE ARTHROPLASTY;  Surgeon: Harvie Junior, MD;  Location: [facility] OR;  Service: Orthopedics;  Laterality: Right;   TOTAL KNEE ARTHROPLASTY Left 12/30/2016   Procedure: TOTAL KNEE ARTHROPLASTY;  Surgeon: Jodi Geralds, MD;  Location: [facility] OR;  Service: Orthopedics;  Laterality: Left;   TUBAL LIGATION      SOCIAL HISTORY: Social History   Socioeconomic History   Marital status: Married    Spouse name: Casimiro Needle   Number of children: 1   Years of education: Not on file   Highest education level: Some college, no degree  Occupational History   Occupation: Medical laboratory scientific officer: [employer]    Employer: [employer]  Tobacco Use   Smoking status: Never   Smokeless tobacco: Never  Vaping Use   Vaping status: Never  Used  Substance and Sexual Activity   Alcohol use: No    Alcohol/week: 0.0 standard drinks of alcohol   Drug use: No   Sexual activity: Yes    Birth control/protection: None  Other Topics Concern   Not on file  Social History Narrative   Lives in [place]; works at [employer].  No alcohol.   Social Determinants of Health   Financial Resource Strain: Low Risk  (12/16/2022)   Overall Financial Resource Strain (CARDIA)    Difficulty of Paying Living Expenses: Not hard at all  Food Insecurity: No Food Insecurity (12/16/2022)   Hunger Vital Sign    Worried About Running Out of Food in the Last Year: Never true    Ran Out of Food in the Last Year: Never true  Transportation Needs: No Transportation Needs (12/16/2022)   PRAPARE - Administrator, Civil Service (Medical): No    Lack of Transportation (Non-Medical): No  Physical Activity: Sufficiently Active (12/16/2022)   Exercise Vital Sign    Days of Exercise per Week: 5 days    Minutes of Exercise per Session: 30 min  Stress: Not on file  Social Connections: Moderately Integrated (12/16/2022)   Social Connection and Isolation Panel [NHANES]    Frequency of Communication with Friends and Family: Once a week    Frequency of Social Gatherings with Friends and Family: Once a week    Attends Religious Services: More than 4 times per year    Active Member of [employer] or Organizations: Yes    Attends Engineer, structural: More than 4 times per year    Marital Status: Married  Catering manager Violence: Not on file    FAMILY HISTORY: Family History  Problem Relation Age of Onset   Diabetes Father    Cancer Father    Liver disease Father    Alcoholism Father    Sarcoidosis Mother    Glaucoma Mother    Hypertension Mother    Hyperlipidemia Mother    Sleep apnea Mother    Obesity Mother    Breast cancer Neg Hx     ALLERGIES:  is allergic to ace inhibitors, amoxicillin-pot clavulanate, influenza virus vacc split  pf, and shellfish allergy.  MEDICATIONS:  Current Outpatient Medications  Medication Sig Dispense Refill   albuterol (VENTOLIN HFA) 108 (90 Base) MCG/ACT inhaler Inhale 2 puffs into the lungs every 6 (six) hours as needed for wheezing or shortness of breath 6.7 g 12   baclofen (LIORESAL) 10 MG tablet Take 1 tablet (10 mg total) by mouth 2 (two) times daily as needed for muscle spasms (pain). Caution of sedation 60 tablet 3   DULoxetine (CYMBALTA) 60 MG capsule Take 1 capsule (60 mg) by mouth at bedtime. 90 capsule 2   fluticasone-salmeterol (ADVAIR DISKUS) 250-50 MCG/ACT AEPB Inhale 1 puff into the lungs 2 times daily then rinse mouth 60 each 10  ipratropium-albuterol (DUONEB) 0.5-2.5 (3) MG/3ML SOLN Inhale 1 vial via nebulizer every 6 (six) hours as needed. 360 mL 12   loratadine (CLARITIN) 10 MG tablet Take 10 mg by mouth daily.     losartan-hydrochlorothiazide (HYZAAR) 100-25 MG tablet TAKE 1 TABLET BY MOUTH ONCE DAILY 90 tablet 3   metFORMIN (GLUCOPHAGE) 500 MG tablet Take 1 tablet by mouth 2 times daily with meals. 180 tablet 0   montelukast (SINGULAIR) 10 MG tablet Take 1 tablet (10 mg total) by mouth at bedtime. 30 tablet 11   Multiple Vitamin (MULTIVITAMIN PO) Take 1 tablet by mouth daily.     Nebulizers (COMPRESSOR/NEBULIZER) MISC Use as directed 1 each 0   rizatriptan (MAXALT-MLT) 5 MG disintegrating tablet Take 1 tablet (5 mg total) by mouth as needed for migraine. May repeat in 2 hours if needed 10 tablet 6   rosuvastatin (CRESTOR) 10 MG tablet Take 1 tablet (10 mg total) by mouth daily. 90 tablet 3   traMADol (ULTRAM) 50 MG tablet Take 1 tablet (50 mg total) by mouth in the morning, 1 tablet (50 mg total) daily in the afternoon, and 2 tablets (100 mg total) at bedtime 120 tablet 0   No current facility-administered medications for this visit.    PHYSICAL EXAMINATION:  Vitals:   05/22/23 1258  BP: 139/74  Pulse: 88  Temp: 97.8 F (36.6 C)  SpO2: 100%     Filed Weights    05/22/23 1258  Weight: 161 lb 3.2 oz (73.1 kg)      Physical Exam Vitals and nursing note reviewed.  HENT:     Head: Normocephalic and atraumatic.     Mouth/Throat:     Pharynx: Oropharynx is clear.  Eyes:     Extraocular Movements: Extraocular movements intact.     Pupils: Pupils are equal, round, and reactive to light.  Cardiovascular:     Rate and Rhythm: Normal rate and regular rhythm.  Pulmonary:     Comments: Decreased breath sounds bilaterally.  Abdominal:     Palpations: Abdomen is soft.  Musculoskeletal:        General: Normal range of motion.     Cervical back: Normal range of motion.  Skin:    General: Skin is warm.  Neurological:     General: No focal deficit present.     Mental Status: She is alert and oriented to person, place, and time.  Psychiatric:        Behavior: Behavior normal.        Judgment: Judgment normal.      LABORATORY DATA:  I have reviewed the data as listed Lab Results  Component Value Date   WBC 5.4 05/22/2023   HGB 13.2 05/22/2023   HCT 40.2 05/22/2023   MCV 89.7 05/22/2023   PLT 102 (L) 05/22/2023   Recent Labs    07/11/22 0754 11/18/22 1447 12/12/22 1139 05/08/23 1011 05/22/23 1253  NA 142 139 141 141 140  K 4.2 4.1 4.2 4.1 3.9  CL 102 101 100 99 99  CO2 34* 29 27 30* 31  GLUCOSE 103* 83 98 88 113*  BUN 16 19 18 14 14   CREATININE 0.58 0.60 0.63 0.61 0.62  CALCIUM 9.6 9.4 9.9 9.9 9.7  GFRNONAA  --  >60  --   --  >60  PROT 6.6  --  7.0 6.9  --   ALBUMIN 4.1  --  4.5 4.4  --   AST 23  --  27 28  --  ALT 27  --  35* 33*  --   ALKPHOS 88  --  118 110  --   BILITOT 0.4  --  0.5 0.4  --     RADIOGRAPHIC STUDIES: I have personally reviewed the radiological images as listed and agreed with the findings in the report. MM 3D DIAGNOSTIC MAMMOGRAM BILATERAL BREAST  Result Date: 05/15/2023 CLINICAL DATA:  Short-term follow-up for probably benign left breast calcifications, initially assessed with diagnostic imaging  on 05/05/2022. EXAM: DIGITAL DIAGNOSTIC BILATERAL MAMMOGRAM WITH TOMOSYNTHESIS AND CAD TECHNIQUE: Bilateral digital diagnostic mammography and breast tomosynthesis was performed. The images were evaluated with computer-aided detection. COMPARISON:  Previous exam(s). ACR Breast Density Category c: The breasts are heterogeneously dense, which may obscure small masses. FINDINGS: The small group calcifications in the posterior, lower outer left breast are without change. There are no defined masses, areas of significant asymmetry, areas of architectural distortion or new calcifications. IMPRESSION: 1. Probably benign left breast calcifications, stable for 1 year. RECOMMENDATION: 1. Diagnostic bilateral mammography with left breast magnification views in 1 year to provide 2 years of stability for the probably benign left breast calcifications. I have discussed the findings and recommendations with the patient. If applicable, a reminder letter will be sent to the patient regarding the next appointment. BI-RADS CATEGORY  3: Probably benign. Electronically Signed   By: Amie Portland M.D.   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   # > 100 patient feels asymptomatic; wants to hold off any repeat blood work today.  # Given stability of her platelets, I think it is reasonable to space out the blood checks to every 3 months.  However if patient notices anything abnormal/easy bruising or nosebleeds.  She will call us sooner.  # DISPOSITION: # 3 month- labs-cbc # follow up in 6 months- MD; labs- cbc/bmp-- Dr.B  All questions were answered. The patient knows to call the clinic with any problems, questions or concerns.       Earna Coder, MD 05/22/2023 1:51 PM

## 2023-05-21 NOTE — Assessment & Plan Note (Signed)
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   # Unfortunately CBC not drawn today.  Patient feels asymptomatic; wants to hold off any repeat blood work today.  # Given stability of her platelets, I think it is reasonable to space out the blood checks to every 3 months.  However if patient notices anything abnormal/easy bruising or nosebleeds.  She will call us sooner.  # DISPOSITION: # 3 month- labs-cbc # follow up in 6 months- MD; labs- cbc/bmp-- Dr.B

## 2023-05-22 ENCOUNTER — Encounter: Payer: Self-pay | Admitting: Internal Medicine

## 2023-05-22 ENCOUNTER — Inpatient Hospital Stay: Payer: Commercial Managed Care - PPO | Attending: Internal Medicine | Admitting: Internal Medicine

## 2023-05-22 ENCOUNTER — Inpatient Hospital Stay: Payer: Commercial Managed Care - PPO

## 2023-05-22 VITALS — BP 139/74 | HR 88 | Temp 97.8°F | Ht 61.0 in | Wt 161.2 lb

## 2023-05-22 DIAGNOSIS — D696 Thrombocytopenia, unspecified: Secondary | ICD-10-CM | POA: Diagnosis not present

## 2023-05-22 LAB — CBC WITH DIFFERENTIAL (CANCER CENTER ONLY)
Abs Immature Granulocytes: 0.01 10*3/uL (ref 0.00–0.07)
Basophils Absolute: 0.1 10*3/uL (ref 0.0–0.1)
Basophils Relative: 1 %
Eosinophils Absolute: 0.2 10*3/uL (ref 0.0–0.5)
Eosinophils Relative: 3 %
HCT: 40.2 % (ref 36.0–46.0)
Hemoglobin: 13.2 g/dL (ref 12.0–15.0)
Immature Granulocytes: 0 %
Lymphocytes Relative: 36 %
Lymphs Abs: 2 10*3/uL (ref 0.7–4.0)
MCH: 29.5 pg (ref 26.0–34.0)
MCHC: 32.8 g/dL (ref 30.0–36.0)
MCV: 89.7 fL (ref 80.0–100.0)
Monocytes Absolute: 0.6 10*3/uL (ref 0.1–1.0)
Monocytes Relative: 12 %
Neutro Abs: 2.6 10*3/uL (ref 1.7–7.7)
Neutrophils Relative %: 48 %
Platelet Count: 102 10*3/uL — ABNORMAL LOW (ref 150–400)
RBC: 4.48 MIL/uL (ref 3.87–5.11)
RDW: 13.6 % (ref 11.5–15.5)
WBC Count: 5.4 10*3/uL (ref 4.0–10.5)
nRBC: 0 % (ref 0.0–0.2)

## 2023-05-22 LAB — BASIC METABOLIC PANEL - CANCER CENTER ONLY
Anion gap: 10 (ref 5–15)
BUN: 14 mg/dL (ref 6–20)
CO2: 31 mmol/L (ref 22–32)
Calcium: 9.7 mg/dL (ref 8.9–10.3)
Chloride: 99 mmol/L (ref 98–111)
Creatinine: 0.62 mg/dL (ref 0.44–1.00)
GFR, Estimated: >60 mL/min (ref >60)
Glucose, Bld: 113 mg/dL — ABNORMAL HIGH (ref 70–99)
Potassium: 3.9 mmol/L (ref 3.5–5.1)
Sodium: 140 mmol/L (ref 135–145)

## 2023-05-22 NOTE — Progress Notes (Signed)
Bruising: yes legs Bleeding from gums: no  Mammogram 05/15/23.  Husband had a stroke, she is taking care of him.

## 2023-06-03 ENCOUNTER — Other Ambulatory Visit: Payer: Self-pay | Admitting: Family Medicine

## 2023-06-03 ENCOUNTER — Other Ambulatory Visit: Payer: Self-pay

## 2023-06-05 ENCOUNTER — Other Ambulatory Visit (HOSPITAL_COMMUNITY): Payer: Self-pay

## 2023-06-05 ENCOUNTER — Other Ambulatory Visit: Payer: Self-pay

## 2023-06-05 MED ORDER — TRAMADOL HCL 50 MG PO TABS
ORAL_TABLET | ORAL | 0 refills | Status: DC
  Filled 2023-06-05: qty 120, 30d supply, fill #0

## 2023-06-05 NOTE — Telephone Encounter (Signed)
Name of Medication: Tramadol  Name of Pharmacy: [facility] Last Fill or Written Date and Quantity: 05/01/23 #120 tabs/ 0 refills  Last Office Visit and Type: 03/09/23 hip pain Next Office Visit and Type: none scheduled

## 2023-06-06 ENCOUNTER — Other Ambulatory Visit: Payer: Self-pay

## 2023-06-06 ENCOUNTER — Other Ambulatory Visit (HOSPITAL_COMMUNITY): Payer: Self-pay

## 2023-06-12 ENCOUNTER — Other Ambulatory Visit (HOSPITAL_COMMUNITY): Payer: Self-pay

## 2023-06-12 ENCOUNTER — Encounter (INDEPENDENT_AMBULATORY_CARE_PROVIDER_SITE_OTHER): Payer: Self-pay | Admitting: Physician Assistant

## 2023-06-13 ENCOUNTER — Other Ambulatory Visit: Payer: Self-pay | Admitting: Internal Medicine

## 2023-06-13 ENCOUNTER — Other Ambulatory Visit: Payer: Self-pay

## 2023-06-13 ENCOUNTER — Other Ambulatory Visit (HOSPITAL_COMMUNITY): Payer: Self-pay

## 2023-06-13 NOTE — Telephone Encounter (Signed)
Please advise 

## 2023-06-19 ENCOUNTER — Telehealth (INDEPENDENT_AMBULATORY_CARE_PROVIDER_SITE_OTHER): Payer: Self-pay

## 2023-06-19 ENCOUNTER — Telehealth (INDEPENDENT_AMBULATORY_CARE_PROVIDER_SITE_OTHER): Payer: Commercial Managed Care - PPO | Admitting: Physician Assistant

## 2023-06-19 ENCOUNTER — Encounter (INDEPENDENT_AMBULATORY_CARE_PROVIDER_SITE_OTHER): Payer: Self-pay | Admitting: Physician Assistant

## 2023-06-19 VITALS — Ht 61.0 in

## 2023-06-19 DIAGNOSIS — R7303 Prediabetes: Secondary | ICD-10-CM | POA: Diagnosis not present

## 2023-06-19 DIAGNOSIS — I10 Essential (primary) hypertension: Secondary | ICD-10-CM

## 2023-06-19 DIAGNOSIS — E669 Obesity, unspecified: Secondary | ICD-10-CM

## 2023-06-19 DIAGNOSIS — E559 Vitamin D deficiency, unspecified: Secondary | ICD-10-CM

## 2023-06-19 DIAGNOSIS — G4700 Insomnia, unspecified: Secondary | ICD-10-CM

## 2023-06-19 DIAGNOSIS — Z6830 Body mass index (BMI) 30.0-30.9, adult: Secondary | ICD-10-CM

## 2023-06-19 MED ORDER — FLUTICASONE-SALMETEROL 250-50 MCG/ACT IN AEPB
1.0000 | INHALATION_SPRAY | Freq: Two times a day (BID) | RESPIRATORY_TRACT | 10 refills | Status: DC
  Filled 2023-06-19: qty 60, 30d supply, fill #0
  Filled 2023-08-02: qty 60, 30d supply, fill #1
  Filled 2023-08-29: qty 60, 30d supply, fill #2

## 2023-06-19 NOTE — Telephone Encounter (Signed)
Left message to call clinic back for video visit.

## 2023-06-19 NOTE — Progress Notes (Signed)
TeleHealth Visit:  This visit was completed with telemedicine (audio/video) technology. Katherine Tanner has verbally consented to this TeleHealth visit. The patient is located at home, the provider is located at the office. The participants in this visit include the listed provider and patient. The visit was conducted today via MyChart video.  OBESITY Katherine Tanner is here to discuss her progress with her obesity treatment plan along with follow-up of her obesity related diagnoses.   Today's visit was # 67 Starting weight: 198 lbs Starting date: 08/30/2018 Weight at last in office visit: 158 lbs on 05/08/23 Total weight loss: 40 lbs at last in office visit on 05/08/23. Today's reported weight (159 lbs):  159 lbs  Nutrition Plan: the Category 2 plan - 90% adherence.  Current exercise: cardiovascular workout on exercise equipment and you tube videos 30 minutes 3 times weekly  She has been with [facility] for the past 33 years.  She works in the lab at [employer]. Interim History:  Katherine Tanner, a 62 year old female with a history of prediabetes, hypertension, and vitamin D deficiency, presents for a telehealth follow-up visit regarding her obesity treatment plan. The patient reports adherence to the dietary plan, motivated in part by her husband's recent diagnosis of diabetes. However, she acknowledges difficulty in finding time for exercise, She has recently purchasing a treadmill. She aims to use the treadmill twice a week and engage in a video workout once a week, totaling approximately 90 minutes of exercise per week.  The patient reports disrupted sleep patterns, which she attributes to menopause and the need to assist her husband during the night. She is considering the use of melatonin to aid sleep. She also mentions occasional daytime napping, which may be affecting her nighttime sleep. Despite these challenges, she denies experiencing excessive rumination or inability to relax. We  discussed using bedtime yoga routine to aid in relaxation and allowing for more time to sleep each night.   In terms of meal planning, the patient's daughter assists by preparing meals for a few days each week. The patient also mentions the possibility of using prepared meats for quick meal preparation. She is currently on metformin, with no changes to her medication regimen. Eating all of the food on the plan., Protein intake is as prescribed, Is not drinking sugar sweetened beverages., Is not exceeding snack calorie allotment, Is not skipping meals, Meeting calorie goals., Meeting protein goals., Denies polyphagia, and Denies excessive cravings.  Eating all of the prescribed protein: yes - 85 grams daily Skipping meals: No Drinking adequate water: No Drinking sugar sweetened beverages: No Hunger controlled: moderately controlled. Cravings controlled:  moderately controlled.   Pharmacotherapy: [place] is on Metformin 500 mg twice daily with meals Adverse side effects: None Hunger is moderately controlled.  Cravings are moderately controlled.  Assessment/Plan:  Obesity Follow-up for obesity management. Adheres to dietary plan due to husband's diabetes but struggles with consistent exercise. Uses treadmill three times a week for 30 minutes and video exercises. Weight gain of one pound noted. Discussed benefits of increasing exercise duration to 150 minutes per week and meal planning strategies, including Kevin's pre-prepared meats. - Encourage continuation of current exercise regimen - Increase exercise duration to 150 minutes per week - Implement meal planning and preparation strategies, including Kevins prepared meats  Insomnia Chronic difficulty sleeping, potentially exacerbated by caregiving responsibilities and possible menopause. Reports disrupted sleep due to husband's nighttime needs and daytime napping. Discussed short-term use of melatonin and bedtime yoga program to aid relaxation.  Advised going  to bed earlier to increase total sleep time. - Recommend melatonin for short-term use - Suggest bedtime yoga program to aid relaxation - Advise going to bed earlier to increase total sleep time   Prediabetes Last A1c was 6.1 - not at goal. Insulin 6.2- nearing goal.  Medication(s): Metformin 500 mg twice daily with meals No Gi or other side effects with metformin.  Polyphagia:No Lab Results  Component Value Date   HGBA1C 6.1 (H) 05/08/2023   HGBA1C 5.6 12/12/2022   HGBA1C 5.8 07/11/2022   HGBA1C 5.6 03/28/2022   HGBA1C 5.6 12/20/2021   Lab Results  Component Value Date   INSULIN 6.2 05/08/2023   INSULIN 7.5 12/12/2022   INSULIN 6.9 09/12/2022   INSULIN 6.7 03/28/2022   INSULIN 10.0 12/20/2021    Plan: Continue Metformin 500 mg twice daily with meals Continue working on nutrition plan to decrease simple carbohydrates, increase lean proteins and exercise to promote weight loss, improve glycemic control and prevent progression to Type 2 diabetes.   Hypertension Hypertension reasonably well controlled and no significant medication side effects noted.  Medication(s): losartan-hydrochlorothiazide 100/25 mg daily Renal function has been in the normal range. BP Readings from Last 3 Encounters:  05/22/23 139/74  05/08/23 133/80  04/03/23 114/70   Lab Results  Component Value Date   CREATININE 0.62 05/22/2023   CREATININE 0.61 05/08/2023   CREATININE 0.63 12/12/2022   Lab Results  Component Value Date   GFR 98.75 07/11/2022   GFR 100.29 07/09/2021   GFR 99.74 07/07/2020    Plan: Continue all antihypertensives at current dosages. Continue to work on nutrition plan to promote weight loss and improve BP control.   History of vitamin D deficiency Her last vitamin D level was elevated at 122 and all vitamin D supplementation has been discontinued at this time.  Will recheck levels over the next 2 to 3 months.  General Health Maintenance None - Encourage  balanced diet and regular exercise - Recommend exploring pre-prepared healthy meal options  Follow-up - Follow-up appointment on Monday, January 20th at 10:00 AM.  Generalized Obesity: Current BMI Last BMI 30.47  Pharmacotherapy Plan Continue  Metformin 500 mg twice daily with meals  Katherine Tanner is currently in the action stage of change. As such, her goal is to continue with weight loss efforts.  She has agreed to the Category 2 plan.  Exercise goals: For substantial health benefits, adults should do at least 150 minutes (2 hours and 30 minutes) a week of moderate-intensity, or 75 minutes (1 hour and 15 minutes) a week of vigorous-intensity aerobic physical activity, or an equivalent combination of moderate- and vigorous-intensity aerobic activity. Aerobic activity should be performed in episodes of at least 10 minutes, and preferably, it should be spread throughout the week.  Behavioral modification strategies: increasing lean protein intake, decreasing simple carbohydrates , no meal skipping, meal planning , increase water intake, planning for success, increasing vegetables, increasing fiber rich foods, holiday eating strategies , and mindful eating.  Katherine Tanner has agreed to follow-up with our clinic in 6 weeks.  No orders of the defined types were placed in this encounter.   There are no discontinued medications.   No orders of the defined types were placed in this encounter.     Objective:   VITALS: Per patient if applicable, see vitals. GENERAL: Alert and in no acute distress. CARDIOPULMONARY: No increased WOB. Speaking in clear sentences.  PSYCH: Pleasant and cooperative. Speech normal rate and rhythm. Affect is appropriate. Insight and judgement  are appropriate. Attention is focused, linear, and appropriate.  NEURO: Oriented as arrived to appointment on time with no prompting.   Attestation Statements:   Reviewed by clinician on day of visit: allergies, medications, problem  list, medical history, surgical history, family history, social history, and previous encounter notes.   Time spent on visit including the items listed below was 30 minutes.  -preparing to see the patient (e.g., review of tests, history, previous notes) -obtaining and/or reviewing separately obtained history -counseling and educating the patient/family/caregiver -documenting clinical information in the electronic or other health record -ordering medications, tests, or procedures -independently interpreting results and communicating results to the patient/ family/caregiver -referring and communicating with other health care professionals  -care coordination   This was prepared with the assistance of Engineer, civil (consulting).  Occasional wrong-word or sound-a-like substitutions may have occurred due to the inherent limitations of voice recognition software.

## 2023-06-19 NOTE — Telephone Encounter (Signed)
Patient returned call

## 2023-06-20 ENCOUNTER — Other Ambulatory Visit: Payer: Self-pay

## 2023-06-22 ENCOUNTER — Other Ambulatory Visit (HOSPITAL_COMMUNITY): Payer: Self-pay

## 2023-07-03 DIAGNOSIS — Z01419 Encounter for gynecological examination (general) (routine) without abnormal findings: Secondary | ICD-10-CM | POA: Diagnosis not present

## 2023-07-09 ENCOUNTER — Other Ambulatory Visit (HOSPITAL_COMMUNITY): Payer: Self-pay

## 2023-07-09 ENCOUNTER — Other Ambulatory Visit: Payer: Self-pay | Admitting: Family Medicine

## 2023-07-10 ENCOUNTER — Other Ambulatory Visit (HOSPITAL_COMMUNITY): Payer: Self-pay

## 2023-07-10 ENCOUNTER — Other Ambulatory Visit: Payer: Self-pay

## 2023-07-10 MED ORDER — BACLOFEN 10 MG PO TABS
10.0000 mg | ORAL_TABLET | Freq: Two times a day (BID) | ORAL | 3 refills | Status: DC | PRN
  Filled 2023-07-10: qty 60, 30d supply, fill #0
  Filled 2023-08-29: qty 60, 30d supply, fill #1
  Filled 2023-10-14: qty 60, 30d supply, fill #2
  Filled 2023-12-01: qty 60, 30d supply, fill #0

## 2023-07-10 MED ORDER — TRAMADOL HCL 50 MG PO TABS
ORAL_TABLET | ORAL | 0 refills | Status: DC
  Filled 2023-07-10: qty 120, 30d supply, fill #0

## 2023-07-10 NOTE — Telephone Encounter (Signed)
 Refill request for   traMADol (ULTRAM) 50 MG tablet    LOV - 03/29/23 Next OV - not scheduled Last refill - 06/05/23 #120/0  Also requesting baclofen to be refilled

## 2023-07-18 ENCOUNTER — Other Ambulatory Visit (HOSPITAL_COMMUNITY): Payer: Self-pay

## 2023-07-24 ENCOUNTER — Ambulatory Visit (INDEPENDENT_AMBULATORY_CARE_PROVIDER_SITE_OTHER): Payer: Commercial Managed Care - PPO | Admitting: Physician Assistant

## 2023-07-27 ENCOUNTER — Ambulatory Visit: Payer: Commercial Managed Care - PPO | Admitting: Internal Medicine

## 2023-07-31 ENCOUNTER — Ambulatory Visit (INDEPENDENT_AMBULATORY_CARE_PROVIDER_SITE_OTHER): Payer: Commercial Managed Care - PPO | Admitting: Physician Assistant

## 2023-08-02 ENCOUNTER — Other Ambulatory Visit (HOSPITAL_COMMUNITY): Payer: Self-pay

## 2023-08-07 ENCOUNTER — Encounter (INDEPENDENT_AMBULATORY_CARE_PROVIDER_SITE_OTHER): Payer: Self-pay | Admitting: Physician Assistant

## 2023-08-07 ENCOUNTER — Other Ambulatory Visit: Payer: Self-pay | Admitting: Internal Medicine

## 2023-08-09 ENCOUNTER — Encounter (INDEPENDENT_AMBULATORY_CARE_PROVIDER_SITE_OTHER): Payer: Self-pay

## 2023-08-09 ENCOUNTER — Ambulatory Visit (INDEPENDENT_AMBULATORY_CARE_PROVIDER_SITE_OTHER): Payer: Commercial Managed Care - PPO | Admitting: Physician Assistant

## 2023-08-12 ENCOUNTER — Other Ambulatory Visit: Payer: Self-pay | Admitting: Family Medicine

## 2023-08-13 ENCOUNTER — Other Ambulatory Visit: Payer: Self-pay

## 2023-08-13 MED ORDER — MONTELUKAST SODIUM 10 MG PO TABS
10.0000 mg | ORAL_TABLET | Freq: Every day | ORAL | 11 refills | Status: DC
  Filled 2023-08-13: qty 30, 30d supply, fill #0
  Filled 2023-09-06 – 2023-09-11 (×3): qty 30, 30d supply, fill #1
  Filled 2023-10-06 (×2): qty 30, 30d supply, fill #2
  Filled 2023-11-06 (×2): qty 30, 30d supply, fill #3
  Filled 2023-12-01: qty 30, 30d supply, fill #0
  Filled 2024-01-01 – 2024-01-02 (×2): qty 30, 30d supply, fill #1
  Filled 2024-01-29 (×2): qty 30, 30d supply, fill #2
  Filled 2024-02-24 – 2024-02-28 (×2): qty 30, 30d supply, fill #3
  Filled 2024-03-25: qty 30, 30d supply, fill #4
  Filled 2024-03-27: qty 30, 30d supply, fill #5
  Filled 2024-03-27: qty 30, 30d supply, fill #4
  Filled 2024-05-14: qty 30, 30d supply, fill #5
  Filled 2024-06-17: qty 30, 30d supply, fill #6

## 2023-08-14 ENCOUNTER — Other Ambulatory Visit (HOSPITAL_COMMUNITY): Payer: Self-pay

## 2023-08-14 ENCOUNTER — Other Ambulatory Visit: Payer: Self-pay

## 2023-08-14 MED ORDER — LOSARTAN POTASSIUM-HCTZ 100-25 MG PO TABS
1.0000 | ORAL_TABLET | Freq: Every day | ORAL | 0 refills | Status: DC
  Filled 2023-08-14: qty 90, 90d supply, fill #0

## 2023-08-14 MED ORDER — TRAMADOL HCL 50 MG PO TABS
ORAL_TABLET | ORAL | 0 refills | Status: DC
  Filled 2023-08-14: qty 120, 30d supply, fill #0

## 2023-08-14 MED ORDER — ROSUVASTATIN CALCIUM 10 MG PO TABS
10.0000 mg | ORAL_TABLET | Freq: Every day | ORAL | 0 refills | Status: DC
  Filled 2023-08-14 – 2023-09-05 (×3): qty 90, 90d supply, fill #0

## 2023-08-14 NOTE — Telephone Encounter (Signed)
 Last OV was an acute appt on 03/29/23, no future appts   Tramadol  filled on 07/10/23  #120 tabs/ 0 refill  Other 2 meds are due for a refill also

## 2023-08-14 NOTE — Telephone Encounter (Signed)
 I think she is due for annual exam this winter Please schedule

## 2023-08-15 ENCOUNTER — Other Ambulatory Visit (HOSPITAL_COMMUNITY): Payer: Self-pay

## 2023-08-15 ENCOUNTER — Other Ambulatory Visit: Payer: Self-pay

## 2023-08-15 NOTE — Telephone Encounter (Signed)
Spoke to pt, per pt's request, scheduled cpe for tomorrow, 2/12.

## 2023-08-16 ENCOUNTER — Ambulatory Visit (INDEPENDENT_AMBULATORY_CARE_PROVIDER_SITE_OTHER): Payer: Commercial Managed Care - HMO | Admitting: Family Medicine

## 2023-08-16 ENCOUNTER — Other Ambulatory Visit: Payer: Self-pay

## 2023-08-16 ENCOUNTER — Encounter: Payer: Self-pay | Admitting: Family Medicine

## 2023-08-16 ENCOUNTER — Other Ambulatory Visit (HOSPITAL_COMMUNITY): Payer: Self-pay

## 2023-08-16 VITALS — BP 122/70 | HR 82 | Temp 98.7°F | Ht 60.25 in | Wt 161.2 lb

## 2023-08-16 DIAGNOSIS — R7303 Prediabetes: Secondary | ICD-10-CM

## 2023-08-16 DIAGNOSIS — G894 Chronic pain syndrome: Secondary | ICD-10-CM

## 2023-08-16 DIAGNOSIS — R7989 Other specified abnormal findings of blood chemistry: Secondary | ICD-10-CM | POA: Insufficient documentation

## 2023-08-16 DIAGNOSIS — E559 Vitamin D deficiency, unspecified: Secondary | ICD-10-CM

## 2023-08-16 DIAGNOSIS — Z0000 Encounter for general adult medical examination without abnormal findings: Secondary | ICD-10-CM | POA: Diagnosis not present

## 2023-08-16 DIAGNOSIS — E7800 Pure hypercholesterolemia, unspecified: Secondary | ICD-10-CM | POA: Diagnosis not present

## 2023-08-16 DIAGNOSIS — Z6830 Body mass index (BMI) 30.0-30.9, adult: Secondary | ICD-10-CM

## 2023-08-16 DIAGNOSIS — R748 Abnormal levels of other serum enzymes: Secondary | ICD-10-CM

## 2023-08-16 DIAGNOSIS — I10 Essential (primary) hypertension: Secondary | ICD-10-CM | POA: Diagnosis not present

## 2023-08-16 DIAGNOSIS — D696 Thrombocytopenia, unspecified: Secondary | ICD-10-CM

## 2023-08-16 DIAGNOSIS — K760 Fatty (change of) liver, not elsewhere classified: Secondary | ICD-10-CM

## 2023-08-16 LAB — LIPID PANEL
Cholesterol: 175 mg/dL (ref 0–200)
HDL: 81.4 mg/dL (ref >39.00)
LDL Cholesterol: 82 mg/dL (ref 0–99)
NonHDL: 93.46
Total CHOL/HDL Ratio: 2
Triglycerides: 55 mg/dL (ref 0.0–149.0)
VLDL: 11 mg/dL (ref 0.0–40.0)

## 2023-08-16 LAB — COMPREHENSIVE METABOLIC PANEL
ALT: 32 U/L (ref 0–35)
AST: 25 U/L (ref 0–37)
Albumin: 4.4 g/dL (ref 3.5–5.2)
Alkaline Phosphatase: 128 U/L — ABNORMAL HIGH (ref 39–117)
BUN: 16 mg/dL (ref 6–23)
CO2: 34 meq/L — ABNORMAL HIGH (ref 19–32)
Calcium: 9.6 mg/dL (ref 8.4–10.5)
Chloride: 101 meq/L (ref 96–112)
Creatinine, Ser: 0.58 mg/dL (ref 0.40–1.20)
GFR: 97.99 mL/min (ref >60.00)
Glucose, Bld: 108 mg/dL — ABNORMAL HIGH (ref 70–99)
Potassium: 4 meq/L (ref 3.5–5.1)
Sodium: 142 meq/L (ref 135–145)
Total Bilirubin: 0.6 mg/dL (ref 0.2–1.2)
Total Protein: 7 g/dL (ref 6.0–8.3)

## 2023-08-16 LAB — VITAMIN D 25 HYDROXY (VIT D DEFICIENCY, FRACTURES): VITD: 42.04 ng/mL (ref 30.00–100.00)

## 2023-08-16 LAB — HEMOGLOBIN A1C: Hgb A1c MFr Bld: 5.8 % (ref 4.6–6.5)

## 2023-08-16 MED ORDER — METFORMIN HCL 500 MG PO TABS
500.0000 mg | ORAL_TABLET | Freq: Two times a day (BID) | ORAL | 3 refills | Status: AC
  Filled 2023-08-16 – 2023-09-02 (×3): qty 180, 90d supply, fill #0
  Filled 2023-11-27: qty 180, 90d supply, fill #1
  Filled 2024-02-05 – 2024-02-13 (×2): qty 180, 90d supply, fill #0
  Filled 2024-05-25 – 2024-05-27 (×2): qty 180, 90d supply, fill #1

## 2023-08-16 NOTE — Assessment & Plan Note (Signed)
Lab Results  Component Value Date   HGBA1C 6.1 (H) 05/08/2023   HGBA1C 5.6 12/12/2022   HGBA1C 5.8 07/11/2022    Lab today  Continues metformin 500 mg bid and weight loss   disc imp of low glycemic diet and wt loss to prevent DM2

## 2023-08-16 NOTE — Assessment & Plan Note (Signed)
bp in fair control at this time  BP Readings from Last 1 Encounters:  08/16/23 122/70   No changes needed Most recent labs reviewed  Disc lifstyle change with low sodium diet and exercise  Plan to continue losartan hct 100-25 mg daily  Has signed off with weight loss clinic  Lab today for cmet

## 2023-08-16 NOTE — Assessment & Plan Note (Signed)
Reviewed health habits including diet and exercise and skin cancer prevention Reviewed appropriate screening tests for age  Also reviewed health mt list, fam hx and immunization status , as well as social and family history   See HPI Labs reviewed and ordered Health Maintenance  Topic Date Due   Pneumococcal Vaccination (2 of 2 - PCV) 12/08/2012   COVID-19 Vaccine (4 - 2024-25 season) 04/13/2024*   Hepatitis C Screening  08/15/2024*   HIV Screening  08/15/2024*   Zoster (Shingles) Vaccine (1 of 2) 11/12/2024*   Mammogram  05/14/2024   Pap with HPV screening  05/31/2027   Colon Cancer Screening  10/13/2029   DTaP/Tdap/Td vaccine (4 - Td or Tdap) 07/18/2032   HPV Vaccine  Aged Out  *Topic was postponed. The date shown is not the original due date.    Allergic to flu shot  Declines shingrix Sees gyn Discussed fall prevention, supplements and exercise for bone density  PHQ )

## 2023-08-16 NOTE — Assessment & Plan Note (Signed)
Continues tramadol  Utd refills Understands risk of sedation and habit

## 2023-08-16 NOTE — Patient Instructions (Signed)
Keep exercising  Add some strength training to your routine, this is important for bone and brain health and can reduce your risk of falls and help your body use insulin properly and regulate weight  Light weights, exercise bands , and internet videos are a good way to start  Yoga (chair or regular), machines , floor exercises or a gym with machines are also good options   Eat well  Take care of yourself   Labs today

## 2023-08-16 NOTE — Assessment & Plan Note (Signed)
Disc goals for lipids and reasons to control them Rev last labs with pt Rev low sat fat diet in detail  Crestor 10 mg daily

## 2023-08-16 NOTE — Progress Notes (Signed)
Subjective:    Patient ID: Katherine Tanner, female    DOB: [birth year], 63 y.o.   MRN: [id number]  HPI  Here for health maintenance exam and to review chronic medical problems   Wt Readings from Last 3 Encounters:  08/16/23 161 lb 4 oz (73.1 kg)  05/22/23 161 lb 3.2 oz (73.1 kg)  05/08/23 158 lb (71.7 kg)   31.23 kg/m  Vitals:   08/16/23 0918  BP: 122/70  Pulse: 82  Temp: 98.7 F (37.1 C)  SpO2: 100%    Immunization History  Administered Date(s) Administered   PFIZER(Purple Top)SARS-COV-2 Vaccination 01/10/2020, 01/31/2020, 07/18/2020   Pneumococcal Polysaccharide-23 12/09/2011   Td 01/02/2001, 07/18/2022   Tdap 12/09/2011    Health Maintenance Due  Topic Date Due   Pneumococcal Vaccine 36-64 Years old (2 of 2 - PCV) 12/08/2012   Declines shingles vaccine  Allergic to the flu shot   Mammogram 05/2023  Self breast exam- no lumps   Gyn health Pap normal 05/2022 with neg HPV  Goes to gyn for exams and pap    Colon cancer screening  10/2019   Bone health   Falls-none  Fractures-none  Supplements  Last vitamin D Lab Results  Component Value Date   VD25OH 122.0 (H) 05/08/2023  Off vit D extra now  Gets D in her mvi   Exercise  Treadmill regularly - twice daily to get in steps now that she is retired  Exercise bands - with programs   Now getting more time to take care of herself     Mood    03/29/2023   12:27 PM 12/19/2022   10:11 AM 07/18/2022    2:59 PM 07/16/2021    2:41 PM 07/14/2020    4:39 PM  Depression screen PHQ 2/9  Decreased Interest 0 0 0 0 0  Down, Depressed, Hopeless 0 0 0 0 0  PHQ - 2 Score 0 0 0 0 0  Altered sleeping 0  0  0  Tired, decreased energy 0  0  0  Change in appetite 0  0  0  Feeling bad or failure about yourself  0  0  0  Trouble concentrating 0  0  0  Moving slowly or fidgety/restless 0  0  0  Suicidal thoughts 0  0  0  PHQ-9 Score 0  0  0  Difficult doing work/chores Not difficult at all  Not difficult  at all  Not difficult at all   Cymbalta 60 mg daily  Husband had stroke in oct  She had to retire   Cannot sleep well  Takes melatonin and volarian root    HTN bp is stable today  No cp or palpitations or headaches or edema  No side effects to medicines  BP Readings from Last 3 Encounters:  08/16/23 122/70  05/22/23 139/74  05/08/23 133/80     Losartan hct 100-25 mg daily   Pulse Readings from Last 3 Encounters:  08/16/23 82  05/22/23 88  05/08/23 77    Lab Results  Component Value Date   NA 140 05/22/2023   K 3.9 05/22/2023   CO2 31 05/22/2023   GLUCOSE 113 (H) 05/22/2023   BUN 14 05/22/2023   CREATININE 0.62 05/22/2023   CALCIUM 9.7 05/22/2023   GFR 98.75 07/11/2022   EGFR 102 05/08/2023   GFRNONAA >60 05/22/2023   Fatty liver Lab Results  Component Value Date   ALT 33 (H) 05/08/2023   AST 28 05/08/2023  ALKPHOS 110 05/08/2023   BILITOT 0.4 05/08/2023   Thrombocytopenia  Lab Results  Component Value Date   WBC 5.4 05/22/2023   HGB 13.2 05/22/2023   HCT 40.2 05/22/2023   MCV 89.7 05/22/2023   PLT 102 (L) 05/22/2023   Sees hematology -has appointment for more labs later this month    Hyperlipidemia Lab Results  Component Value Date   CHOL 139 05/08/2023   HDL 68 05/08/2023   LDLCALC 62 05/08/2023   LDLDIRECT 147.9 12/17/2012   TRIG 38 05/08/2023   CHOLHDL 2 07/11/2022  Crestor 10 mg daily   Prediabetes Lab Results  Component Value Date   HGBA1C 6.1 (H) 05/08/2023   HGBA1C 5.6 12/12/2022   HGBA1C 5.8 07/11/2022   Metformin 500 mg bid  Wants to continue this    Patient Active Problem List   Diagnosis Date Noted   High serum vitamin D 08/16/2023   SI (sacroiliac) pain 03/29/2023   Chronic migraine w/o aura w/o status migrainosus, not intractable 01/11/2023   Abnormal MRI of head 12/22/2022   Menopause 07/25/2022   Chronic pain syndrome 02/09/2022   Nonalcoholic hepatosteatosis 07/06/2020   At risk for heart disease 07/06/2020    Transaminitis 04/07/2020   Thrombocytopenia (HCC) 07/08/2019   Vitamin D deficiency 11/05/2018   Obstructive sleep apnea 10/10/2017   Primary osteoarthritis of left knee 12/30/2016   BMI 30.0-30.9,adult 05/24/2016   Prediabetes 02/24/2014   Osteoarthritis of right knee 11/15/2013   Osteoarthritis of left knee 11/15/2013   Alkaline phosphatase elevation 01/02/2012   Hyperlipidemia 01/02/2012   Routine general medical examination at a health care facility 12/09/2011   Allergy to influenza vaccine 05/02/2011   Chronic low back pain 03/08/2011   POLYARTHRITIS 01/28/2010   Chronic pain of both knees 12/11/2009   Angioedema 03/27/2007   Essential hypertension 03/19/2007   Perennial allergic rhinitis with seasonal variation 03/19/2007   Asthma, moderate persistent 03/19/2007   GERD 03/19/2007   HIATAL HERNIA 03/19/2007   Past Medical History:  Diagnosis Date   Allergy    takes Claritin daily and Flonase daily   Arthritis    Asthma    uses Albuterol daily as needed;Singulair nightly;DUlera daily   Back pain    Constipation    Fatty liver    Food allergy    GERD (gastroesophageal reflux disease)    occasionally will take OTC meds but states she just watches what she eats   H/O hiatal hernia    History of bronchitis    last time 21yrs ago   History of colon polyps    History of kidney stones    History of shingles    Hyperlipidemia    Hypertension    takes Hyzaar daily   Joint pain    Joint pain    Joint swelling    Obesity    Osteoarthritis    Pneumonia    hx of;last time at least 6yrs ago   PONV (postoperative nausea and vomiting)    Prediabetes    Shortness of breath    Sleep apnea    Swelling of lower extremity    Trouble in sleeping    Wheezing    Past Surgical History:  Procedure Laterality Date   BREAST EXCISIONAL BIOPSY Right    BREAST SURGERY  1981   Rt breast lump removed   CARDIAC CATHETERIZATION     COLONOSCOPY     ESOPHAGOGASTRODUODENOSCOPY      JOINT REPLACEMENT  11/15/13   Rt knee . Lt  knee 12/30/16   KNEE SURGERY Right    arthroscopy   Right ganglion cyst     x 2   right lumpectomy  around 1983   STERIOD INJECTION Left 11/15/2013   Procedure: Marcaine/STEROID INJECTION;  Surgeon: Harvie Junior, MD;  Location: [facility] OR;  Service: Orthopedics;  Laterality: Left;   TOTAL KNEE ARTHROPLASTY Right 11/15/2013   Procedure: RIGHT TOTAL KNEE ARTHROPLASTY;  Surgeon: Harvie Junior, MD;  Location: [facility] OR;  Service: Orthopedics;  Laterality: Right;   TOTAL KNEE ARTHROPLASTY Left 12/30/2016   Procedure: TOTAL KNEE ARTHROPLASTY;  Surgeon: Jodi Geralds, MD;  Location: [facility] OR;  Service: Orthopedics;  Laterality: Left;   TUBAL LIGATION     Social History   Tobacco Use   Smoking status: Never   Smokeless tobacco: Never  Vaping Use   Vaping status: Never Used  Substance Use Topics   Alcohol use: No   Drug use: No   Family History  Problem Relation Age of Onset   Diabetes Father    Cancer Father    Liver disease Father    Alcoholism Father    Alcohol abuse Father    Sarcoidosis Mother    Glaucoma Mother    Hypertension Mother    Hyperlipidemia Mother    Sleep apnea Mother    Obesity Mother    Vision loss Mother    Early death Maternal Grandmother    Heart disease Maternal Grandmother    Breast cancer Neg Hx    Allergies  Allergen Reactions   Ace Inhibitors Shortness Of Breath and Cough    Cough/ breathing problems    Amoxicillin-Pot Clavulanate Swelling    SWELLING REACTION UNSPECIFIED    Influenza Virus Vacc Split Pf     UNSPECIFIED REACTION    Shellfish Allergy Itching   Current Outpatient Medications on File Prior to Visit  Medication Sig Dispense Refill   baclofen (LIORESAL) 10 MG tablet Take 1 tablet (10 mg total) by mouth 2 (two) times daily as needed for muscle spasms (pain). Caution of sedation 60 tablet 3   DULoxetine (CYMBALTA) 60 MG capsule Take 1 capsule (60 mg) by mouth at bedtime. 90 capsule 2    fluticasone-salmeterol (ADVAIR DISKUS) 250-50 MCG/ACT AEPB Inhale 1 puff into the lungs 2 times daily then rinse mouth 60 each 10   ipratropium-albuterol (DUONEB) 0.5-2.5 (3) MG/3ML SOLN Inhale 1 vial via nebulizer every 6 (six) hours as needed. 360 mL 12   loratadine (CLARITIN) 10 MG tablet Take 10 mg by mouth daily.     losartan-hydrochlorothiazide (HYZAAR) 100-25 MG tablet Take 1 tablet by mouth daily. 90 tablet 0   montelukast (SINGULAIR) 10 MG tablet Take 1 tablet (10 mg total) by mouth at bedtime. 30 tablet 11   Multiple Vitamin (MULTIVITAMIN PO) Take 1 tablet by mouth daily.     Nebulizers (COMPRESSOR/NEBULIZER) MISC Use as directed 1 each 0   rizatriptan (MAXALT-MLT) 5 MG disintegrating tablet Take 1 tablet (5 mg total) by mouth as needed for migraine. May repeat in 2 hours if needed 10 tablet 6   rosuvastatin (CRESTOR) 10 MG tablet Take 1 tablet (10 mg total) by mouth daily. 90 tablet 0   traMADol (ULTRAM) 50 MG tablet Take 1 tablet (50 mg total) by mouth in the morning AND 1 tablet (50 mg total) daily in the afternoon AND 2 tablets (100 mg total) at bedtime. 120 tablet 0   No current facility-administered medications on file prior to visit.    Review of  Systems  Constitutional:  Negative for activity change, appetite change, fatigue, fever and unexpected weight change.  HENT:  Negative for congestion, ear pain, rhinorrhea, sinus pressure and sore throat.   Eyes:  Negative for pain, redness and visual disturbance.  Respiratory:  Negative for cough, shortness of breath and wheezing.   Cardiovascular:  Negative for chest pain and palpitations.  Gastrointestinal:  Negative for abdominal pain, blood in stool, constipation and diarrhea.  Endocrine: Negative for polydipsia and polyuria.  Genitourinary:  Negative for dysuria, frequency and urgency.  Musculoskeletal:  Negative for arthralgias, back pain and myalgias.  Skin:  Negative for pallor and rash.  Allergic/Immunologic: Negative for  environmental allergies.  Neurological:  Negative for dizziness, syncope and headaches.  Hematological:  Negative for adenopathy. Does not bruise/bleed easily.  Psychiatric/Behavioral:  Positive for sleep disturbance. Negative for decreased concentration and dysphoric mood. The patient is not nervous/anxious.        Objective:   Physical Exam Constitutional:      General: She is not in acute distress.    Appearance: Normal appearance. She is well-developed. She is obese. She is not ill-appearing or diaphoretic.  HENT:     Head: Normocephalic and atraumatic.     Right Ear: Tympanic membrane, ear canal and external ear normal.     Left Ear: Tympanic membrane, ear canal and external ear normal.     Nose: Nose normal. No congestion.     Mouth/Throat:     Mouth: Mucous membranes are moist.     Pharynx: Oropharynx is clear. No posterior oropharyngeal erythema.  Eyes:     General: No scleral icterus.    Extraocular Movements: Extraocular movements intact.     Conjunctiva/sclera: Conjunctivae normal.     Pupils: Pupils are equal, round, and reactive to light.  Neck:     Thyroid: No thyromegaly.     Vascular: No carotid bruit or JVD.  Cardiovascular:     Rate and Rhythm: Normal rate and regular rhythm.     Pulses: Normal pulses.     Heart sounds: Normal heart sounds.     No gallop.  Pulmonary:     Effort: Pulmonary effort is normal. No respiratory distress.     Breath sounds: Normal breath sounds. No wheezing.     Comments: Good air exch Chest:     Chest wall: No tenderness.  Abdominal:     General: Bowel sounds are normal. There is no distension or abdominal bruit.     Palpations: Abdomen is soft. There is no mass.     Tenderness: There is no abdominal tenderness.     Hernia: No hernia is present.  Genitourinary:    Comments: Breast and pelvic exam are done by gyn provider   Musculoskeletal:        General: No tenderness. Normal range of motion.     Cervical back: Normal  range of motion and neck supple. No rigidity. No muscular tenderness.     Right lower leg: No edema.     Left lower leg: No edema.     Comments: No kyphosis   Lymphadenopathy:     Cervical: No cervical adenopathy.  Skin:    General: Skin is warm and dry.     Coloration: Skin is not pale.     Findings: No erythema or rash.     Comments: Few skin tags and lentigines   Neurological:     Mental Status: She is alert. Mental status is at baseline.  Cranial Nerves: No cranial nerve deficit.     Motor: No abnormal muscle tone.     Coordination: Coordination normal.     Gait: Gait normal.     Deep Tendon Reflexes: Reflexes are normal and symmetric.  Psychiatric:        Mood and Affect: Mood normal.        Cognition and Memory: Cognition and memory normal.           Assessment & Plan:   Problem List Items Addressed This Visit       Cardiovascular and Mediastinum   Essential hypertension   bp in fair control at this time  BP Readings from Last 1 Encounters:  08/16/23 122/70   No changes needed Most recent labs reviewed  Disc lifstyle change with low sodium diet and exercise  Plan to continue losartan hct 100-25 mg daily  Has signed off with weight loss clinic  Lab today for cmet       Relevant Orders   Comprehensive metabolic panel   Lipid panel     Digestive   Nonalcoholic hepatosteatosis   Cmet today  Significant weight loss Doing well No symptoms         Hematopoietic and Hemostatic   Thrombocytopenia (HCC)   Continues heme follow up  No bruising or bleeding Labs planned later this month        Other   Vitamin D deficiency   Had to stop D for high level  On mvi Lab today      Routine general medical examination at a health care facility - Primary   Reviewed health habits including diet and exercise and skin cancer prevention Reviewed appropriate screening tests for age  Also reviewed health mt list, fam hx and immunization status , as well as  social and family history   See HPI Labs reviewed and ordered Health Maintenance  Topic Date Due   Pneumococcal Vaccination (2 of 2 - PCV) 12/08/2012   COVID-19 Vaccine (4 - 2024-25 season) 04/13/2024*   Hepatitis C Screening  08/15/2024*   HIV Screening  08/15/2024*   Zoster (Shingles) Vaccine (1 of 2) 11/12/2024*   Mammogram  05/14/2024   Pap with HPV screening  05/31/2027   Colon Cancer Screening  10/13/2029   DTaP/Tdap/Td vaccine (4 - Td or Tdap) 07/18/2032   HPV Vaccine  Aged Out  *Topic was postponed. The date shown is not the original due date.    Allergic to flu shot  Declines shingrix Sees gyn Discussed fall prevention, supplements and exercise for bone density  PHQ )       Prediabetes   Lab Results  Component Value Date   HGBA1C 6.1 (H) 05/08/2023   HGBA1C 5.6 12/12/2022   HGBA1C 5.8 07/11/2022    Lab today  Continues metformin 500 mg bid and weight loss   disc imp of low glycemic diet and wt loss to prevent DM2       Relevant Medications   metFORMIN (GLUCOPHAGE) 500 MG tablet   Other Relevant Orders   Hemoglobin A1c   Hyperlipidemia   Disc goals for lipids and reasons to control them Rev last labs with pt Rev low sat fat diet in detail  Crestor 10 mg daily      Relevant Orders   Comprehensive metabolic panel   Lipid panel   High serum vitamin D   Last vitamin D Lab Results  Component Value Date   VD25OH 122.0 (H) 05/08/2023   Stopped her  D supplement  Now mvi   Lab today      Relevant Orders   VITAMIN D 25 Hydroxy (Vit-D Deficiency, Fractures)   Chronic pain syndrome   Continues tramadol  Utd refills Understands risk of sedation and habit      BMI 30.0-30.9,adult   Has signed off with weight loss clinic Discussed how this problem influences overall health and the risks it imposes  Reviewed plan for weight loss with lower calorie diet (via better food choices (lower glycemic and portion control) along with exercise building up to  or more than 30 minutes 5 days per week including some aerobic activity and strength training    Encouraged to add more strength building exercise

## 2023-08-16 NOTE — Assessment & Plan Note (Signed)
Has signed off with weight loss clinic Discussed how this problem influences overall health and the risks it imposes  Reviewed plan for weight loss with lower calorie diet (via better food choices (lower glycemic and portion control) along with exercise building up to or more than 30 minutes 5 days per week including some aerobic activity and strength training    Encouraged to add more strength building exercise

## 2023-08-16 NOTE — Assessment & Plan Note (Signed)
Last vitamin D Lab Results  Component Value Date   VD25OH 122.0 (H) 05/08/2023   Stopped her D supplement  Now mvi   Lab today

## 2023-08-16 NOTE — Assessment & Plan Note (Signed)
Cmet today  Significant weight loss Doing well No symptoms

## 2023-08-16 NOTE — Assessment & Plan Note (Signed)
Had to stop D for high level  On mvi Lab today

## 2023-08-16 NOTE — Assessment & Plan Note (Signed)
Continues heme follow up  No bruising or bleeding Labs planned later this month

## 2023-08-28 ENCOUNTER — Inpatient Hospital Stay: Payer: Commercial Managed Care - HMO

## 2023-08-29 ENCOUNTER — Other Ambulatory Visit (HOSPITAL_COMMUNITY): Payer: Self-pay

## 2023-09-02 ENCOUNTER — Other Ambulatory Visit (HOSPITAL_COMMUNITY): Payer: Self-pay

## 2023-09-04 ENCOUNTER — Inpatient Hospital Stay: Payer: Commercial Managed Care - HMO | Attending: Internal Medicine

## 2023-09-04 ENCOUNTER — Other Ambulatory Visit: Payer: Self-pay

## 2023-09-04 DIAGNOSIS — D696 Thrombocytopenia, unspecified: Secondary | ICD-10-CM | POA: Diagnosis present

## 2023-09-04 LAB — CBC WITH DIFFERENTIAL (CANCER CENTER ONLY)
Abs Immature Granulocytes: 0.02 10*3/uL (ref 0.00–0.07)
Basophils Absolute: 0.1 10*3/uL (ref 0.0–0.1)
Basophils Relative: 1 %
Eosinophils Absolute: 0.1 10*3/uL (ref 0.0–0.5)
Eosinophils Relative: 1 %
HCT: 38.5 % (ref 36.0–46.0)
Hemoglobin: 12.4 g/dL (ref 12.0–15.0)
Immature Granulocytes: 0 %
Lymphocytes Relative: 34 %
Lymphs Abs: 2 10*3/uL (ref 0.7–4.0)
MCH: 29 pg (ref 26.0–34.0)
MCHC: 32.2 g/dL (ref 30.0–36.0)
MCV: 90.2 fL (ref 80.0–100.0)
Monocytes Absolute: 0.7 10*3/uL (ref 0.1–1.0)
Monocytes Relative: 12 %
Neutro Abs: 3 10*3/uL (ref 1.7–7.7)
Neutrophils Relative %: 52 %
Platelet Count: 121 10*3/uL — ABNORMAL LOW (ref 150–400)
RBC: 4.27 MIL/uL (ref 3.87–5.11)
RDW: 13.5 % (ref 11.5–15.5)
WBC Count: 5.9 10*3/uL (ref 4.0–10.5)
nRBC: 0 % (ref 0.0–0.2)

## 2023-09-05 ENCOUNTER — Other Ambulatory Visit: Payer: Self-pay

## 2023-09-05 ENCOUNTER — Other Ambulatory Visit (HOSPITAL_COMMUNITY): Payer: Self-pay

## 2023-09-09 ENCOUNTER — Other Ambulatory Visit (HOSPITAL_COMMUNITY): Payer: Self-pay

## 2023-09-09 NOTE — Progress Notes (Unsigned)
 Patient ID: Katherine Tanner, female    DOB: 11-18-1960, 63 y.o.   MRN: [id number]  HPI female never smoker followed for Allergic rhinitis, Asthma,OSA, complicated by GERD Office spirometry: 11/24/2014: WNL FVC 2.39/97%, FEV1 2.09/103%, FEV1/FVC 0.88, FEF 25-75 percent 3.27/122% Office Spirometry 07/06/17-WNL-FVC 2.25/91%, FEV1 2.04/104%, ratio 0.91, FEF 25-75% 3.62/173% FENO 10/09/17   11   Not elevated Methacholine inhalation challenge test 01/25/2018-interpreted as positive because of initiation of hard coughing although FEV1 only declined by 15%. HST 10/27/2017-AHI 7.3/hour, desaturation to 88%, body weight 193 pounds PFT 07/26/21- Normal flows and volumes, DLCO relatively high for volume ----------------------------------------------------------------------------------   07/26/22- 63 year old female never smoker followed for Allergic rhinitis, Asthma,  Hx OSA(minimal- no CPAP/ lost weight), complicated by GERD, HTN, Thrombocytopenia, PreDiabetes,  -Neb Duoneb, Singulair, Advair 250, Flonase, albuterol hfa,  Epipen for Shellfish allergy  Covid vax-3 Phizer Flu vax-declined "allergic" Body weight today -159 lbs Notes more chest mucus on first waking. No infection or significant asthma. Walking into work in cold air may cause some wheeze- uses rescue inhaler then. Dry nose nosebleeds some if platelets low. Has airpurifier and humidifier.  09/12/23- 63 year old female never smoker followed for Allergic rhinitis, Asthma,  Hx OSA(minimal- no CPAP/ lost weight), complicated by GERD, HTN, Thrombocytopenia, PreDiabetes,  -Neb Duoneb, Singulair, Advair 250, Flonase, albuterol hfa,  Epipen for Shellfish allergy  ACT score 16 Recent onset of seasonal rhinitis with mucus, blame pollen. Bronchodilators not quite enough so we discussed trial of Breztri now during her peak season. Discussed the use of AI scribe software for clinical note transcription with the patient, who gave verbal consent to  proceed. History of Present Illness   The patient, with a history of asthma, presents with increased mucus production that began last week. She describes waking up early in the morning with mucus 'rolling up.' In response, she has added Mucinex Max to her regular medications and switched from Claritin to Allegra for seasonal allergies. She also uses Flonase daily during the allergy season. She has a family history of glaucoma but has not been diagnosed with the condition herself.     Review of Systems-Per HPI.   + = positive Constitutional:   +diet>   weight loss, night sweats, fevers, chills, fatigue, lassitude. HEENT:   +  headaches, difficulty swallowing, tooth/dental problems, sore throat,       No-sneezing, itching, no-ear ache, + nasal congestion, +post nasal drip,  CV:   chest pain, orthopnea, PND, swelling in lower extremities, anasarca, dizziness, palpitations Resp: +shortness of breath with exertion or at rest.            + productive cough,  No- non-productive cough,  No- coughing up of blood.              No-   change in color of mucus. +wheezing.   Skin:  GI:  No-   heartburn, indigestion, abdominal pain, nausea, vomiting,  GU: . MS:  No-   joint pain or swelling.  . Neuro-     nothing unusual Psych:  No- change in mood or affect. No depression or anxiety.  No memory loss.  Objective:   Physical Exam General- Alert, Oriented, Affect-appropriate, Distress- none acute;  Skin- rash-none, lesions- none, excoriation- none Lymphadenopathy- none Head- atraumatic            Eyes- Gross vision intact, PERRLA, conjunctivae clear, no discolored secretions            Ears- Hearing, canals-normal  Nose-    no-Septal dev, mucus, polyps, erosion, perforation             Throat- Mallampati III , mucosa clear , drainage- none, tonsils- atrophic Neck- flexible , trachea midline, no stridor , thyroid nl, carotid no bruit Chest - symmetrical excursion , unlabored           Heart/CV-  RRR , no murmur , no gallop  , no rub, nl s1 s2                           - JVD- none , edema- none, stasis changes- none, varices- none           Lung-  unlabored, wheeze+mild, cough- none , dullness-none, rub- none,            Chest wall-  Abd-  Br/ Gen/ Rectal- Not done, not indicated Extrem- cyanosis- none, clubbing, none, atrophy- none, strength- nl Neuro- grossly intact to observation Assessment and Plan    Asthma Asthma managed with Advair. Wheezing and chest mucus present. Breztri suggested for trial as stronger alternative. - Provide Breztri samples for trial period. - Reassess need to return to Advair post-trial. - Monitor for adverse effects or lack of improvement.  Allergic Rhinitis Increased mucus production during tree pollen season. Emphasized consistent Flonase use. - Continue Allegra. - Use Flonase daily during allergy season.  Follow-up Plan follow-up during allergy season. Advised to contact if symptoms worsen or persist. - Schedule follow-up in a couple of months. - Contact office if symptoms worsen or persist.

## 2023-09-11 ENCOUNTER — Other Ambulatory Visit: Payer: Self-pay | Admitting: Family Medicine

## 2023-09-11 ENCOUNTER — Other Ambulatory Visit: Payer: Self-pay

## 2023-09-12 ENCOUNTER — Ambulatory Visit: Payer: Commercial Managed Care - PPO | Admitting: Internal Medicine

## 2023-09-12 ENCOUNTER — Other Ambulatory Visit (HOSPITAL_COMMUNITY): Payer: Self-pay

## 2023-09-12 ENCOUNTER — Encounter: Payer: Self-pay | Admitting: Internal Medicine

## 2023-09-12 VITALS — BP 130/74 | HR 78 | Temp 98.3°F | Ht 61.0 in | Wt 167.0 lb

## 2023-09-12 DIAGNOSIS — J45909 Unspecified asthma, uncomplicated: Secondary | ICD-10-CM

## 2023-09-12 DIAGNOSIS — J309 Allergic rhinitis, unspecified: Secondary | ICD-10-CM

## 2023-09-12 DIAGNOSIS — J4541 Moderate persistent asthma with (acute) exacerbation: Secondary | ICD-10-CM

## 2023-09-12 MED ORDER — TRAMADOL HCL 50 MG PO TABS
ORAL_TABLET | ORAL | 0 refills | Status: DC
  Filled 2023-09-12: qty 120, 30d supply, fill #0

## 2023-09-12 MED ORDER — BREZTRI AEROSPHERE 160-9-4.8 MCG/ACT IN AERO: 2.0000 | INHALATION_SPRAY | Freq: Two times a day (BID) | RESPIRATORY_TRACT | Status: DC

## 2023-09-12 NOTE — Patient Instructions (Signed)
 Order- sample x 2 Breztri inhaler    inhale 2 puffs then rinse mouth, twice daily. Try this instead of Advair for now. When samples run out, go back to Advair and see how you are doing.  Let me know if you are having a hard time and we will decide what else to do

## 2023-09-12 NOTE — Telephone Encounter (Signed)
 Name of Medication: Tramadol Name of Pharmacy: [facility] Last Fill or Written Date and Quantity: 08/14/23 #120 tabs/ 0 refills  Last Office Visit and Type: 08/16/23 CPE Next Office Visit and Type: none scheduled

## 2023-09-13 ENCOUNTER — Other Ambulatory Visit: Payer: Self-pay

## 2023-09-15 ENCOUNTER — Other Ambulatory Visit (HOSPITAL_COMMUNITY): Payer: Self-pay

## 2023-09-20 ENCOUNTER — Encounter: Payer: Self-pay | Admitting: Internal Medicine

## 2023-09-20 DIAGNOSIS — J4541 Moderate persistent asthma with (acute) exacerbation: Secondary | ICD-10-CM

## 2023-09-20 DIAGNOSIS — J4540 Moderate persistent asthma, uncomplicated: Secondary | ICD-10-CM

## 2023-09-21 NOTE — Telephone Encounter (Signed)
 If still struggling with this asthma flare and Breztri didn't help, Then ok to go back to Advair  And order prednisone 10 mg, # 20, 4 X 2 DAYS, 3 X 2 DAYS, 2 X 2 DAYS, 1 X 2 DAYS

## 2023-09-22 ENCOUNTER — Other Ambulatory Visit (HOSPITAL_COMMUNITY): Payer: Self-pay

## 2023-09-22 MED ORDER — PREDNISONE 10 MG PO TABS
ORAL_TABLET | ORAL | 0 refills | Status: AC
  Filled 2023-09-22: qty 20, 7d supply, fill #0

## 2023-09-22 MED ORDER — FLUTICASONE-SALMETEROL 250-50 MCG/ACT IN AEPB
1.0000 | INHALATION_SPRAY | Freq: Two times a day (BID) | RESPIRATORY_TRACT | 10 refills | Status: DC
  Filled 2023-09-22: qty 60, 30d supply, fill #0
  Filled 2023-11-13: qty 60, 30d supply, fill #1
  Filled 2023-12-15: qty 60, 30d supply, fill #0
  Filled 2024-01-16: qty 60, 30d supply, fill #1
  Filled 2024-02-13: qty 60, 30d supply, fill #2
  Filled 2024-03-15: qty 60, 30d supply, fill #3
  Filled 2024-04-17: qty 60, 30d supply, fill #4
  Filled 2024-05-20: qty 60, 30d supply, fill #5
  Filled 2024-06-17: qty 60, 30d supply, fill #6

## 2023-10-06 ENCOUNTER — Other Ambulatory Visit: Payer: Self-pay

## 2023-10-06 ENCOUNTER — Other Ambulatory Visit (HOSPITAL_COMMUNITY): Payer: Self-pay

## 2023-10-09 ENCOUNTER — Other Ambulatory Visit (HOSPITAL_COMMUNITY): Payer: Self-pay

## 2023-10-14 ENCOUNTER — Other Ambulatory Visit: Payer: Self-pay | Admitting: Family Medicine

## 2023-10-16 ENCOUNTER — Other Ambulatory Visit (HOSPITAL_COMMUNITY): Payer: Self-pay

## 2023-10-16 ENCOUNTER — Other Ambulatory Visit: Payer: Self-pay

## 2023-10-16 MED ORDER — DULOXETINE HCL 60 MG PO CPEP
60.0000 mg | ORAL_CAPSULE | Freq: Every day | ORAL | 1 refills | Status: DC
  Filled 2023-10-16 – 2024-01-16 (×4): qty 90, 90d supply, fill #0

## 2023-10-16 MED ORDER — TRAMADOL HCL 50 MG PO TABS
ORAL_TABLET | ORAL | 0 refills | Status: DC
  Filled 2023-10-16: qty 120, 30d supply, fill #0

## 2023-10-16 NOTE — Telephone Encounter (Signed)
 Name of Medication: Tramadol Name of [facility]acy: [facility] Last Fill or Written Date and Quantity: 08/14/23 #120 tabs/ 0 refills  Last Office Visit and Type: 08/16/23 CPE Next Office Visit and Type: none scheduled      Cymbalta Last filled on 01/23/23 #90 caps/ 2 refills

## 2023-10-17 ENCOUNTER — Other Ambulatory Visit: Payer: Self-pay

## 2023-11-06 ENCOUNTER — Other Ambulatory Visit: Payer: Self-pay | Admitting: Family Medicine

## 2023-11-06 ENCOUNTER — Other Ambulatory Visit: Payer: Self-pay

## 2023-11-06 ENCOUNTER — Other Ambulatory Visit (HOSPITAL_COMMUNITY): Payer: Self-pay

## 2023-11-06 MED ORDER — LOSARTAN POTASSIUM-HCTZ 100-25 MG PO TABS
1.0000 | ORAL_TABLET | Freq: Every day | ORAL | 2 refills | Status: DC
  Filled 2023-11-06 – 2024-02-05 (×3): qty 90, 90d supply, fill #0
  Filled 2024-05-04 – 2024-05-08 (×2): qty 90, 90d supply, fill #1

## 2023-11-07 ENCOUNTER — Other Ambulatory Visit (HOSPITAL_COMMUNITY): Payer: Self-pay

## 2023-11-13 ENCOUNTER — Other Ambulatory Visit: Payer: Self-pay | Admitting: Family Medicine

## 2023-11-13 NOTE — Progress Notes (Unsigned)
 Patient ID: Katherine Tanner, female    DOB: 29-Jun-1961, 63 y.o.   MRN: [id number]  HPI female never smoker followed for Allergic rhinitis, Asthma,OSA, complicated by GERD Office spirometry: 11/24/2014: WNL FVC 2.39/97%, FEV1 2.09/103%, FEV1/FVC 0.88, FEF 25-75 percent 3.27/122% Office Spirometry 07/06/17-WNL-FVC 2.25/91%, FEV1 2.04/104%, ratio 0.91, FEF 25-75% 3.62/173% FENO 10/09/17   11   Not elevated Methacholine  inhalation challenge test 01/25/2018-interpreted as positive because of initiation of hard coughing although FEV1 only declined by 15%. HST 10/27/2017-AHI 7.3/hour, desaturation to 88%, body weight 193 pounds PFT 07/26/21- Normal flows and volumes, DLCO relatively high for volume ----------------------------------------------------------------------------------   07/26/22- 63 year old female never smoker followed for Allergic rhinitis, Asthma,  Hx OSA(minimal- no CPAP/ lost weight), complicated by GERD, HTN, Thrombocytopenia, PreDiabetes,  -Neb Duoneb, Singulair , Advair  250, Flonase , albuterol  hfa,  Epipen  for Shellfish allergy   Covid vax-3 Phizer Flu vax-declined "allergic" Body weight today -159 lbs Notes more chest mucus on first waking. No infection or significant asthma. Walking into work in cold air may cause some wheeze- uses rescue inhaler then. Dry nose nosebleeds some if platelets low. Has airpurifier and humidifier.  09/12/23- 63 year old female never smoker followed for Allergic rhinitis, Asthma,  Hx OSA(minimal- no CPAP/ lost weight), complicated by GERD, HTN, Thrombocytopenia, PreDiabetes,  -Neb Duoneb, Singulair , Advair  250, Flonase , albuterol  hfa,  Epipen  for Shellfish allergy   ACT score 16 Recent onset of seasonal rhinitis with mucus, blame pollen. Bronchodilators not quite enough so we discussed trial of Breztri  now during her peak season. Discussed the use of AI scribe software for clinical note transcription with the patient, who gave verbal consent to  proceed. History of Present Illness   The patient, with a history of asthma, presents with increased mucus production that began last week. She describes waking up early in the morning with mucus 'rolling up.' In response, she has added Mucinex  Max to her regular medications and switched from Claritin  to Allegra for seasonal allergies. She also uses Flonase  daily during the allergy  season. She has a family history of glaucoma but has not been diagnosed with the condition herself.   Assessment and Plan:    Asthma Asthma managed with Advair . Wheezing and chest mucus present. Breztri  suggested for trial as stronger alternative. - Provide Breztri  samples for trial period. - Reassess need to return to Advair  post-trial. - Monitor for adverse effects or lack of improvement.  Allergic Rhinitis Increased mucus production during tree pollen season. Emphasized consistent Flonase  use. - Continue Allegra. - Use Flonase  daily during allergy  season.  Follow-up Plan follow-up during allergy  season. Advised to contact if symptoms worsen or persist. - Schedule follow-up in a couple of months. - Contact office if symptoms worsen or persist.    11/14/23-  63 year old female never smoker followed for Allergic rhinitis, Asthma,  Hx OSA(minimal- no CPAP/ lost weight), complicated by GERD, HTN, Thrombocytopenia, PreDiabetes,  -Neb Duoneb, Singulair , Advair  250, Flonase , albuterol  hfa,  Epipen  for Shellfish allergy    Discussed the use of AI scribe software for clinical note transcription with the patient, who gave verbal consent to proceed.  History of Present Illness   Katherine Tanner is a 63 year old female with asthma who presents with respiratory symptoms exacerbated by weather changes.  Her respiratory symptoms, including sore lungs and changes in her voice, have worsened with recent fluctuations in barometric pressure and humidity. She has used her albuterol  rescue inhaler twice recently to  manage these symptoms. She is currently using Advair  and an albuterol  rescue inhaler. Previously,  she required steroids to stabilize her condition before returning to Advair .     Assessment and Plan:    Asthma moderate persistent uncomplicated Asthma exacerbated by weather changes. Current regimen includes Advair  and albuterol . History of steroid use for control. - Continue Advair  and albuterol  as needed. - Monitor symptoms and consider steroid use if exacerbation persists.  Shellfish allergy  Recent accidental exposure due to cross-contamination. No EpiPen  available. - Prescribed EpiPen .     Review of Systems-Per HPI.   + = positive Constitutional:   +diet>   weight loss, night sweats, fevers, chills, fatigue, lassitude. HEENT:   +  headaches, difficulty swallowing, tooth/dental problems, sore throat,       No-sneezing, itching, no-ear ache, + nasal congestion, +post nasal drip,  CV:   chest pain, orthopnea, PND, swelling in lower extremities, anasarca, dizziness, palpitations Resp: +shortness of breath with exertion or at rest.            + productive cough,  No- non-productive cough,  No- coughing up of blood.              No-   change in color of mucus. +wheezing.   Skin:  GI:  No-   heartburn, indigestion, abdominal pain, nausea, vomiting,  GU: . MS:  No-   joint pain or swelling.  . Neuro-     nothing unusual Psych:  No- change in mood or affect. No depression or anxiety.  No memory loss.  Objective:   Physical Exam General- Alert, Oriented, Affect-appropriate, Distress- none acute;  Skin- rash-none, lesions- none, excoriation- none Lymphadenopathy- none Head- atraumatic            Eyes- Gross vision intact, PERRLA, conjunctivae clear, no discolored secretions            Ears- Hearing, canals-normal            Nose-    no-Septal dev, mucus, polyps, erosion, perforation             Throat- Mallampati III , mucosa clear , drainage- none, tonsils- atrophic Neck- flexible ,  trachea midline, no stridor , thyroid  nl, carotid no bruit Chest - symmetrical excursion , unlabored           Heart/CV- RRR , no murmur , no gallop  , no rub, nl s1 s2                           - JVD- none , edema- none, stasis changes- none, varices- none           Lung-  unlabored, wheeze+mild, cough- none , dullness-none, rub- none,            Chest wall-  Abd-  Br/ Gen/ Rectal- Not done, not indicated Extrem- cyanosis- none, clubbing, none, atrophy- none, strength- nl Neuro- grossly intact to observation

## 2023-11-14 ENCOUNTER — Other Ambulatory Visit: Payer: Self-pay

## 2023-11-14 ENCOUNTER — Other Ambulatory Visit (HOSPITAL_COMMUNITY): Payer: Self-pay

## 2023-11-14 ENCOUNTER — Ambulatory Visit: Admitting: Internal Medicine

## 2023-11-14 ENCOUNTER — Encounter: Payer: Self-pay | Admitting: Internal Medicine

## 2023-11-14 VITALS — BP 102/70 | HR 80 | Ht 61.0 in | Wt 166.0 lb

## 2023-11-14 DIAGNOSIS — J4540 Moderate persistent asthma, uncomplicated: Secondary | ICD-10-CM

## 2023-11-14 DIAGNOSIS — J45909 Unspecified asthma, uncomplicated: Secondary | ICD-10-CM | POA: Diagnosis not present

## 2023-11-14 MED ORDER — EPINEPHRINE 0.3 MG/0.3ML IJ SOAJ
0.3000 mg | Freq: Once | INTRAMUSCULAR | 3 refills | Status: AC
  Filled 2023-11-14: qty 2, 14d supply, fill #0
  Filled 2023-11-22 – 2023-11-24 (×3): qty 2, 14d supply, fill #1
  Filled 2023-12-01: qty 2, 14d supply, fill #0

## 2023-11-14 MED ORDER — TRAMADOL HCL 50 MG PO TABS
ORAL_TABLET | ORAL | 0 refills | Status: DC
  Filled 2023-11-14: qty 120, 30d supply, fill #0

## 2023-11-14 NOTE — Telephone Encounter (Signed)
 Name of Medication: Tramadol  Name of [facility]: [facility] Last Fill or Written Date and Quantity: 10/16/23 #120 tabs/ 0 refills  Last Office Visit and Type: 08/16/23 CPE Next Office Visit and Type: none scheduled

## 2023-11-14 NOTE — Patient Instructions (Signed)
 Epipen  refilled to keep it available  Ok to continue current inhalers Please let us  know if we can help

## 2023-11-20 ENCOUNTER — Encounter: Payer: Self-pay | Admitting: Internal Medicine

## 2023-11-20 ENCOUNTER — Telehealth: Payer: Self-pay | Admitting: Pharmacist

## 2023-11-20 ENCOUNTER — Inpatient Hospital Stay: Payer: Commercial Managed Care - PPO | Attending: Internal Medicine

## 2023-11-20 ENCOUNTER — Inpatient Hospital Stay: Payer: Commercial Managed Care - PPO | Admitting: Internal Medicine

## 2023-11-20 VITALS — BP 141/89 | HR 79 | Temp 98.0°F | Resp 16 | Ht 61.0 in | Wt 164.6 lb

## 2023-11-20 DIAGNOSIS — Z79899 Other long term (current) drug therapy: Secondary | ICD-10-CM | POA: Insufficient documentation

## 2023-11-20 DIAGNOSIS — D696 Thrombocytopenia, unspecified: Secondary | ICD-10-CM

## 2023-11-20 DIAGNOSIS — D693 Immune thrombocytopenic purpura: Secondary | ICD-10-CM | POA: Insufficient documentation

## 2023-11-20 LAB — CBC WITH DIFFERENTIAL (CANCER CENTER ONLY)
Abs Immature Granulocytes: 0.01 10*3/uL (ref 0.00–0.07)
Basophils Absolute: 0 10*3/uL (ref 0.0–0.1)
Basophils Relative: 1 %
Eosinophils Absolute: 0.1 10*3/uL (ref 0.0–0.5)
Eosinophils Relative: 2 %
HCT: 38.5 % (ref 36.0–46.0)
Hemoglobin: 12.5 g/dL (ref 12.0–15.0)
Immature Granulocytes: 0 %
Lymphocytes Relative: 38 %
Lymphs Abs: 1.8 10*3/uL (ref 0.7–4.0)
MCH: 28.7 pg (ref 26.0–34.0)
MCHC: 32.5 g/dL (ref 30.0–36.0)
MCV: 88.5 fL (ref 80.0–100.0)
Monocytes Absolute: 0.6 10*3/uL (ref 0.1–1.0)
Monocytes Relative: 11 %
Neutro Abs: 2.3 10*3/uL (ref 1.7–7.7)
Neutrophils Relative %: 48 %
Platelet Count: 109 10*3/uL — ABNORMAL LOW (ref 150–400)
RBC: 4.35 MIL/uL (ref 3.87–5.11)
RDW: 13.7 % (ref 11.5–15.5)
WBC Count: 4.8 10*3/uL (ref 4.0–10.5)
nRBC: 0 % (ref 0.0–0.2)

## 2023-11-20 LAB — BASIC METABOLIC PANEL WITH GFR
Anion gap: 10 (ref 5–15)
BUN: 18 mg/dL (ref 8–23)
CO2: 27 mmol/L (ref 22–32)
Calcium: 9.3 mg/dL (ref 8.9–10.3)
Chloride: 100 mmol/L (ref 98–111)
Creatinine, Ser: 0.65 mg/dL (ref 0.44–1.00)
GFR, Estimated: >60 mL/min (ref >60)
Glucose, Bld: 96 mg/dL (ref 70–99)
Potassium: 4.2 mmol/L (ref 3.5–5.1)
Sodium: 137 mmol/L (ref 135–145)

## 2023-11-20 NOTE — Progress Notes (Signed)
 Easy bruising: YES Petechiae (bleeding under skin): NO Gingival bleeding (gums): NO  Epistaxis (nose bleeds): NO X2 MONTHS Hematochezia (blood in stools): NO  Hematuria (blood in urine):  NO  Is there something she can take for menopause/hot flashes that will not effect her blood counts?  C/o bruise like spot on left calf x2 months.  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

## 2023-11-20 NOTE — Assessment & Plan Note (Signed)
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   # > 100 patient feels asymptomatic;  Given stability of her platelets, I think it is reasonable to space out the blood checks to every 3 months.  However if patient notices anything abnormal/easy bruising or nosebleeds.  She will call us  sooner.  # Hot flashes- wants to try- menopause Gummies.checked with pharmacy-re: the ingredients have any interactions with platelet dysfunction.   # Rash with central cleaning on left calf [no itch]- recommend topical antifungal   # DISPOSITION: # 3 month- labs-cbc # follow up in 6 months- MD; labs- cbc/bmp-- Dr.B

## 2023-11-20 NOTE — Progress Notes (Signed)
 [facility] CONSULT NOTE  Patient Care Team: Tower, Manley Seeds, MD as PCP - General Gwyn Leos, MD as Consulting Physician (Internal Medicine)  CHIEF COMPLAINTS/PURPOSE OF CONSULTATION: Thrombocytopenia  # THROMBOCYTOPENIA: [since 2019 > 100]; 2023- 70-80s; Normal- WBC/platelets [Dr.Shadad; last May 2022]; HIV/hepatitis negative as per pt [[facility]- dx:PN]  #Intentional weight loss [~50 pounds over the last 1 year]  HISTORY OF PRESENTING ILLNESS: Ambulating independently.  Alone.  Katherine Tanner 63 y.o.  female patient with longstanding history of intermittent thrombocytopenia clinically ITP is here for follow-up.  No bleeding gums. No nose bleeds.  No blood in stools or black-colored stools. Patient denies any significant bleeding.   C/o bruise like spot on left calf x2 months- no pain no itch; no trauma.   Review of Systems  Constitutional:  Positive for malaise/fatigue. Negative for chills, diaphoresis, fever and weight loss.  HENT:  Negative for nosebleeds and sore throat.   Eyes:  Negative for double vision.  Respiratory:  Negative for cough, hemoptysis, sputum production, shortness of breath and wheezing.   Cardiovascular:  Negative for chest pain, palpitations, orthopnea and leg swelling.  Gastrointestinal:  Negative for abdominal pain, blood in stool, constipation, diarrhea, heartburn, melena, nausea and vomiting.  Genitourinary:  Negative for dysuria, frequency and urgency.  Musculoskeletal:  Positive for back pain and joint pain.  Skin: Negative.  Negative for itching and rash.  Neurological:  Negative for dizziness, tingling, focal weakness, weakness and headaches.  Endo/Heme/Allergies:  Does not bruise/bleed easily.  Psychiatric/Behavioral:  Negative for depression. The patient is not nervous/anxious and does not have insomnia.      MEDICAL HISTORY:  Past Medical History:  Diagnosis Date   Allergy     takes Claritin  daily and  Flonase  daily   Arthritis    Asthma    uses Albuterol  daily as needed;Singulair  nightly;DUlera  daily   Back pain    Constipation    Fatty liver    Food allergy     GERD (gastroesophageal reflux disease)    occasionally will take OTC meds but states she just watches what she eats   H/O hiatal hernia    History of bronchitis    last time 54yrs ago   History of colon polyps    History of kidney stones    History of shingles    Hyperlipidemia    Hypertension    takes Hyzaar  daily   Joint pain    Joint pain    Joint swelling    Obesity    Osteoarthritis    Pneumonia    hx of;last time at least 50yrs ago   PONV (postoperative nausea and vomiting)    Prediabetes    Shortness of breath    Sleep apnea    Swelling of lower extremity    Trouble in sleeping    Wheezing     SURGICAL HISTORY: Past Surgical History:  Procedure Laterality Date   BREAST EXCISIONAL BIOPSY Right    BREAST SURGERY  1981   Rt breast lump removed   CARDIAC CATHETERIZATION     COLONOSCOPY     ESOPHAGOGASTRODUODENOSCOPY     JOINT REPLACEMENT  11/15/13   Rt knee . Lt knee 12/30/16   KNEE SURGERY Right    arthroscopy   Right ganglion cyst     x 2   right lumpectomy  around 1983   STERIOD INJECTION Left 11/15/2013   Procedure: Marcaine /STEROID INJECTION;  Surgeon: Boston Byers, MD;  Location: [facility] OR;  Service: Orthopedics;  Laterality: Left;   TOTAL KNEE ARTHROPLASTY Right 11/15/2013   Procedure: RIGHT TOTAL KNEE ARTHROPLASTY;  Surgeon: Boston Byers, MD;  Location: [facility] OR;  Service: Orthopedics;  Laterality: Right;   TOTAL KNEE ARTHROPLASTY Left 12/30/2016   Procedure: TOTAL KNEE ARTHROPLASTY;  Surgeon: Neil Balls, MD;  Location: [facility] OR;  Service: Orthopedics;  Laterality: Left;   TUBAL LIGATION      SOCIAL HISTORY: Social History   Socioeconomic History   Marital status: Married    Spouse name: Bambi Lever   Number of children: 1   Years of education: Not on file   Highest education level: Some  college, no degree  Occupational History   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  No alcohol.   Social Drivers of Corporate investment banker Strain: Low Risk  (08/15/2023)   Overall Financial Resource Strain (CARDIA)    Difficulty of Paying Living Expenses: Not hard at all  Food Insecurity: No Food Insecurity (08/15/2023)   Hunger Vital Sign    Worried About Running Out of Food in the Last Year: Never true    Ran Out of Food in the Last Year: Never true  Transportation Needs: No Transportation Needs (08/15/2023)   PRAPARE - Administrator, Civil Service (Medical): No    Lack of Transportation (Non-Medical): No  Physical Activity: Sufficiently Active (08/15/2023)   Exercise Vital Sign    Days of Exercise per Week: 5 days    Minutes of Exercise per Session: 140 min  Stress: No Stress Concern Present (08/15/2023)   [employer] of Occupational Health - Occupational Stress Questionnaire    Feeling of Stress : Not at all  Social Connections: Socially Integrated (08/15/2023)   Social Connection and Isolation Panel [NHANES]    Frequency of Communication with Friends and Family: More than three times a week    Frequency of Social Gatherings with Friends and Family: Twice a week    Attends Religious Services: More than 4 times per year    Active Member of [employer] or Organizations: Yes    Attends Engineer, structural: More than 4 times per year    Marital Status: Married  Catering manager Violence: Not on file    FAMILY HISTORY: Family History  Problem  Relation Age of Onset   Diabetes Father    Cancer Father    Liver disease Father    Alcoholism Father    Alcohol abuse Father    Sarcoidosis Mother    Glaucoma Mother    Hypertension Mother    Hyperlipidemia Mother    Sleep apnea Mother    Obesity Mother    Vision loss Mother    Early death Maternal Grandmother    Heart disease Maternal Grandmother    Breast cancer Neg Hx     ALLERGIES:  is allergic to ace inhibitors, amoxicillin-pot clavulanate, influenza virus vacc split pf, and shellfish allergy .  MEDICATIONS:  Current Outpatient Medications  Medication Sig Dispense Refill   baclofen  (LIORESAL )  10 MG tablet Take 1 tablet (10 mg total) by mouth 2 (two) times daily as needed for muscle spasms (pain). Caution of sedation 60 tablet 3   DULoxetine  (CYMBALTA ) 60 MG capsule Take 1 capsule (60 mg) by mouth at bedtime. 90 capsule 1   fluticasone -salmeterol (ADVAIR  DISKUS) 250-50 MCG/ACT AEPB Inhale 1 puff into the lungs 2 times daily then rinse mouth 60 each 10   ipratropium-albuterol  (DUONEB) 0.5-2.5 (3) MG/3ML SOLN Inhale 1 vial via nebulizer every 6 (six) hours as needed. 360 mL 12   loratadine  (CLARITIN ) 10 MG tablet Take 10 mg by mouth daily.     losartan -hydrochlorothiazide  (HYZAAR ) 100-25 MG tablet Take 1 tablet by mouth daily. 90 tablet 2   metFORMIN  (GLUCOPHAGE ) 500 MG tablet Take 1 tablet by mouth 2 times daily with meals. 180 tablet 3   montelukast  (SINGULAIR ) 10 MG tablet Take 1 tablet (10 mg total) by mouth at bedtime. 30 tablet 11   Multiple Vitamin (MULTIVITAMIN PO) Take 1 tablet by mouth daily.     Nebulizers (COMPRESSOR/NEBULIZER) MISC Use as directed 1 each 0   rizatriptan  (MAXALT -MLT) 5 MG disintegrating tablet Take 1 tablet (5 mg total) by mouth as needed for migraine. May repeat in 2 hours if needed 10 tablet 6   rosuvastatin  (CRESTOR ) 10 MG tablet Take 1 tablet (10 mg total) by mouth daily. 90 tablet 0   traMADol  (ULTRAM ) 50 MG tablet Take 1 tablet (50 mg total) by  mouth in the morning AND 1 tablet (50 mg total) daily in the afternoon AND 2 tablets (100 mg total) at bedtime. 120 tablet 0   budeson-glycopyrrolate -formoterol  (BREZTRI  AEROSPHERE) 160-9-4.8 MCG/ACT AERO Inhale 2 puffs into the lungs in the morning and at bedtime. (Patient not taking: Reported on 11/20/2023)     No current facility-administered medications for this visit.    PHYSICAL EXAMINATION:  Vitals:   11/20/23 1246 11/20/23 1325  BP: (!) 137/93 (!) 141/89  Pulse: 79   Resp: 16   Temp: 98 F (36.7 C)   SpO2: 97%      Filed Weights   11/20/23 1246  Weight: 164 lb 9.6 oz (74.7 kg)      Physical Exam Vitals and nursing note reviewed.  HENT:     Head: Normocephalic and atraumatic.     Mouth/Throat:     Pharynx: Oropharynx is clear.  Eyes:     Extraocular Movements: Extraocular movements intact.     Pupils: Pupils are equal, round, and reactive to light.  Cardiovascular:     Rate and Rhythm: Normal rate and regular rhythm.  Pulmonary:     Comments: Decreased breath sounds bilaterally.  Abdominal:     Palpations: Abdomen is soft.  Musculoskeletal:        General: Normal range of motion.     Cervical back: Normal range of motion.  Skin:    General: Skin is warm.  Neurological:     General: No focal deficit present.     Mental Status: She is alert and oriented to person, place, and time.  Psychiatric:        Behavior: Behavior normal.        Judgment: Judgment normal.      LABORATORY DATA:  I have reviewed the data as listed Lab Results  Component Value Date   WBC 4.8 11/20/2023   HGB 12.5 11/20/2023   HCT 38.5 11/20/2023   MCV 88.5 11/20/2023   PLT 109 (L) 11/20/2023   Recent Labs    12/12/22  1139 05/08/23 1011 05/22/23 1253 08/16/23 0948 11/20/23 1252  NA 141 141 140 142 137  K 4.2 4.1 3.9 4.0 4.2  CL 100 99 99 101 100  CO2 27 30* 31 34* 27  GLUCOSE 98 88 113* 108* 96  BUN 18 14 14 16 18   CREATININE 0.63 0.61 0.62 0.58 0.65  CALCIUM  9.9  9.9 9.7 9.6 9.3  GFRNONAA  --   --  >60  --  >60  PROT 7.0 6.9  --  7.0  --   ALBUMIN 4.5 4.4  --  4.4  --   AST 27 28  --  25  --   ALT 35* 33*  --  32  --   ALKPHOS 118 110  --  128*  --   BILITOT 0.5 0.4  --  0.6  --     RADIOGRAPHIC STUDIES: I have personally reviewed the radiological images as listed and agreed with the findings in the report. No results found.  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   # > 100 patient feels asymptomatic;  Given stability of her platelets, I think it is reasonable to space out the blood checks to every 3 months.  However if patient notices anything abnormal/easy bruising or nosebleeds.  She will call us  sooner.  # Hot flashes- wants to try- menopause Gummies.checked with pharmacy-re: the ingredients have any interactions with platelet dysfunction.   # Rash with central cleaning on left calf [no itch]- recommend topical antifungal   # DISPOSITION: # 3 month- labs-cbc # follow up in 6 months- MD; labs- cbc/bmp-- Dr.B  All questions were answered. The patient knows to call the clinic with any problems, questions or concerns.       Gwyn Leos, MD 11/20/2023 1:55 PM

## 2023-11-20 NOTE — Telephone Encounter (Signed)
 Patient had asked Dr. Valentine Gasmen about taking menopause gummies to help with her hot flashes and Dr. Valentine Gasmen reached out to me to see if this would cause any problems with her ITP (not currently requiring treatment). Per MD the gummies contain chasteberry, aswagandha; black cohosh.   The [facility] drug resource, does not have have an hematologic concerns for those 3 ingredients. However, I checked them with her medications list and there are many interaction between the aswagandha and black cohosh and several of her medication (duloxetine , hydrochlorthiazide, losartan , metformin , rosuvastatin , tramadol , and loratadine ).  Reviewed the above with Ms. Trollinger-Smith and she decided to hold off on trying the menopause gummies.

## 2023-11-22 ENCOUNTER — Other Ambulatory Visit (HOSPITAL_COMMUNITY): Payer: Self-pay

## 2023-11-23 ENCOUNTER — Other Ambulatory Visit (HOSPITAL_COMMUNITY): Payer: Self-pay

## 2023-11-24 ENCOUNTER — Other Ambulatory Visit (HOSPITAL_COMMUNITY): Payer: Self-pay

## 2023-12-01 ENCOUNTER — Other Ambulatory Visit: Payer: Self-pay

## 2023-12-01 ENCOUNTER — Other Ambulatory Visit (HOSPITAL_COMMUNITY): Payer: Self-pay

## 2023-12-02 ENCOUNTER — Other Ambulatory Visit: Payer: Self-pay | Admitting: Family Medicine

## 2023-12-04 ENCOUNTER — Other Ambulatory Visit (HOSPITAL_BASED_OUTPATIENT_CLINIC_OR_DEPARTMENT_OTHER): Payer: Self-pay

## 2023-12-04 ENCOUNTER — Other Ambulatory Visit (HOSPITAL_COMMUNITY): Payer: Self-pay

## 2023-12-04 MED ORDER — ROSUVASTATIN CALCIUM 10 MG PO TABS
10.0000 mg | ORAL_TABLET | Freq: Every day | ORAL | 1 refills | Status: DC
  Filled 2023-12-04 – 2023-12-07 (×2): qty 90, 90d supply, fill #0
  Filled 2024-02-13 – 2024-02-19 (×2): qty 90, 90d supply, fill #1

## 2023-12-07 ENCOUNTER — Other Ambulatory Visit (HOSPITAL_COMMUNITY): Payer: Self-pay

## 2023-12-07 ENCOUNTER — Other Ambulatory Visit: Payer: Self-pay

## 2023-12-08 ENCOUNTER — Ambulatory Visit: Admitting: Family Medicine

## 2023-12-08 ENCOUNTER — Encounter: Payer: Self-pay | Admitting: Family Medicine

## 2023-12-08 ENCOUNTER — Other Ambulatory Visit: Payer: Self-pay

## 2023-12-08 VITALS — BP 130/66 | HR 91 | Temp 98.7°F | Ht 61.0 in | Wt 164.2 lb

## 2023-12-08 DIAGNOSIS — N951 Menopausal and female climacteric states: Secondary | ICD-10-CM | POA: Insufficient documentation

## 2023-12-08 DIAGNOSIS — B354 Tinea corporis: Secondary | ICD-10-CM

## 2023-12-08 DIAGNOSIS — L989 Disorder of the skin and subcutaneous tissue, unspecified: Secondary | ICD-10-CM | POA: Diagnosis not present

## 2023-12-08 MED ORDER — KETOCONAZOLE 2 % EX CREA
1.0000 | TOPICAL_CREAM | Freq: Every day | CUTANEOUS | 0 refills | Status: DC
  Filled 2023-12-08: qty 15, 30d supply, fill #0

## 2023-12-08 NOTE — Assessment & Plan Note (Signed)
 This resembles tinea - by shape and central clearing  Differential also includes eczema (nummular) and insect bite (denies)  Will try ketoconazole  If not affective , would try topical steroid   Update if not starting to improve in a week or if worsening  Call back and Er precautions noted in detail today

## 2023-12-08 NOTE — Progress Notes (Signed)
 Subjective:    Patient ID: Katherine Tanner, female    DOB: 20-Aug-1960, 63 y.o.   MRN: [id number]  HPI  Wt Readings from Last 3 Encounters:  12/08/23 164 lb 4 oz (74.5 kg)  11/20/23 164 lb 9.6 oz (74.7 kg)  11/14/23 166 lb (75.3 kg)   31.03 kg/m  Vitals:   12/08/23 1014  BP: 130/66  Pulse: 91  Temp: 98.7 F (37.1 C)  SpO2: 100%   Pt presents to check skin spots on leg Mentions menopausal hot flashes also    Spot on leg left for 2 months  Her hematologist told her to try an anti fungal   No insect/tick bites  No exp to poison ivy or oak  Starting to itch  Was red last night - better today   Got [facility] brand topical antifungal for jock itch  Started 10 d ago  May have helped a little = maybe spreading      Mentions menopause symptoms  Hot flashes at night       Patient Active Problem List   Diagnosis Date Noted   Ringworm of body 12/08/2023   Skin lesion of left leg 12/08/2023   Hot flashes due to menopause 12/08/2023   High serum vitamin D  08/16/2023   SI (sacroiliac) pain 03/29/2023   Chronic migraine w/o aura w/o status migrainosus, not intractable 01/11/2023   Abnormal MRI of head 12/22/2022   Menopause 07/25/2022   Chronic pain syndrome 02/09/2022   Nonalcoholic hepatosteatosis 07/06/2020   At risk for heart disease 07/06/2020   Transaminitis 04/07/2020   Thrombocytopenia (HCC) 07/08/2019   Vitamin D  deficiency 11/05/2018   Obstructive sleep apnea 10/10/2017   Primary osteoarthritis of left knee 12/30/2016   BMI 30.0-30.9,adult 05/24/2016   Prediabetes 02/24/2014   Osteoarthritis of right knee 11/15/2013   Osteoarthritis of left knee 11/15/2013   Alkaline phosphatase elevation 01/02/2012   Hyperlipidemia 01/02/2012   Routine general medical examination at a health care facility 12/09/2011   Allergy  to influenza vaccine 05/02/2011   Chronic low back pain 03/08/2011   POLYARTHRITIS 01/28/2010   Chronic pain of both knees 12/11/2009    Angioedema 03/27/2007   Essential hypertension 03/19/2007   Perennial allergic rhinitis with seasonal variation 03/19/2007   Asthma, moderate persistent 03/19/2007   GERD 03/19/2007   HIATAL HERNIA 03/19/2007   Past Medical History:  Diagnosis Date   Allergy  08/10/07   takes Claritin  daily and Flonase  daily   Arthritis    Asthma    uses Albuterol  daily as needed;Singulair  nightly;DUlera  daily   Back pain    Constipation    Fatty liver    Food allergy     GERD (gastroesophageal reflux disease)    occasionally will take OTC meds but states she just watches what she eats   H/O hiatal hernia    History of bronchitis    last time 34yrs ago   History of colon polyps    History of kidney stones    History of shingles    Hyperlipidemia    Hypertension    takes Hyzaar  daily   Joint pain    Joint pain    Joint swelling    Obesity    Osteoarthritis    Pneumonia    hx of;last time at least 1yrs ago   PONV (postoperative nausea and vomiting)    Prediabetes    Shortness of breath    Sleep apnea    Swelling of lower extremity    Trouble in sleeping  Wheezing    Past Surgical History:  Procedure Laterality Date   BREAST EXCISIONAL BIOPSY Right    BREAST SURGERY  1981   Rt breast lump removed   CARDIAC CATHETERIZATION     COLONOSCOPY     ESOPHAGOGASTRODUODENOSCOPY     JOINT REPLACEMENT  11/15/13   Rt knee . Lt knee 12/30/16   KNEE SURGERY Right    arthroscopy   Right ganglion cyst     x 2   right lumpectomy  around 1983   STERIOD INJECTION Left 11/15/2013   Procedure: Marcaine /STEROID INJECTION;  Surgeon: Boston Byers, MD;  Location[facility];  Service: Orthopedics;  Laterality: Left;   TOTAL KNEE ARTHROPLASTY Right 11/15/2013   Procedure: RIGHT TOTAL KNEE ARTHROPLASTY;  Surgeon: Boston Byers, MD;  Location: [facility];  Service: Orthopedics;  Laterality: Right;   TOTAL KNEE ARTHROPLASTY Left 12/30/2016   Procedure: TOTAL KNEE ARTHROPLASTY;  Surgeon: Neil Balls, MD;   Location: [facility];  Service: Orthopedics;  Laterality: Left;   TUBAL LIGATION  07/1993   Social History   Tobacco Use   Smoking status: Never   Smokeless tobacco: Never  Vaping Use   Vaping status: Never Used  Substance Use Topics   Alcohol use: No   Drug use: No   Family History  Problem Relation Age of Onset   Diabetes Father    Cancer Father    Liver disease Father    Alcoholism Father    Alcohol abuse Father    Sarcoidosis Mother    Glaucoma Mother    Hypertension Mother    Hyperlipidemia Mother    Sleep apnea Mother    Obesity Mother    Vision loss Mother    Early death Maternal Grandmother    Heart disease Maternal Grandmother    Breast cancer Neg Hx    Allergies  Allergen Reactions   Ace Inhibitors Shortness Of Breath and Cough    Cough/ breathing problems    Amoxicillin-Pot Clavulanate Swelling    SWELLING REACTION UNSPECIFIED    Influenza Virus Vacc Split Pf     UNSPECIFIED REACTION    Shellfish Allergy  Itching   Current Outpatient Medications on File Prior to Visit  Medication Sig Dispense Refill   baclofen  (LIORESAL ) 10 MG tablet Take 1 tablet (10 mg total) by mouth 2 (two) times daily as needed for muscle spasms (pain). Caution of sedation 60 tablet 3   DULoxetine  (CYMBALTA ) 60 MG capsule Take 1 capsule (60 mg) by mouth at bedtime. 90 capsule 1   fluticasone -salmeterol (ADVAIR  DISKUS) 250-50 MCG/ACT AEPB Inhale 1 puff into the lungs 2 times daily then rinse mouth 60 each 10   ipratropium-albuterol  (DUONEB) 0.5-2.5 (3) MG/3ML SOLN Inhale 1 vial via nebulizer every 6 (six) hours as needed. 360 mL 12   loratadine  (CLARITIN ) 10 MG tablet Take 10 mg by mouth daily.     losartan -hydrochlorothiazide  (HYZAAR ) 100-25 MG tablet Take 1 tablet by mouth daily. 90 tablet 2   metFORMIN  (GLUCOPHAGE ) 500 MG tablet Take 1 tablet by mouth 2 times daily with meals. 180 tablet 3   montelukast  (SINGULAIR ) 10 MG tablet Take 1 tablet (10 mg total) by mouth at bedtime. 30 tablet  11   Multiple Vitamin (MULTIVITAMIN PO) Take 1 tablet by mouth daily.     Nebulizers (COMPRESSOR/NEBULIZER) MISC Use as directed 1 each 0   rizatriptan  (MAXALT -MLT) 5 MG disintegrating tablet Take 1 tablet (5 mg total) by mouth as needed for migraine. May repeat in 2 hours  if needed 10 tablet 6   rosuvastatin  (CRESTOR ) 10 MG tablet Take 1 tablet (10 mg total) by mouth daily. 90 tablet 1   traMADol  (ULTRAM ) 50 MG tablet Take 1 tablet (50 mg total) by mouth in the morning AND 1 tablet (50 mg total) daily in the afternoon AND 2 tablets (100 mg total) at bedtime. 120 tablet 0   No current facility-administered medications on file prior to visit.    Review of Systems  Constitutional:  Negative for activity change, appetite change, fatigue, fever and unexpected weight change.  HENT:  Negative for congestion, ear pain, rhinorrhea, sinus pressure and sore throat.   Eyes:  Negative for pain, redness and visual disturbance.  Respiratory:  Negative for cough, shortness of breath and wheezing.   Cardiovascular:  Negative for chest pain and palpitations.  Gastrointestinal:  Negative for abdominal pain, blood in stool, constipation and diarrhea.  Endocrine: Positive for heat intolerance. Negative for polydipsia and polyuria.       Hot flashes at night   Genitourinary:  Negative for dysuria, frequency and urgency.  Musculoskeletal:  Negative for arthralgias, back pain and myalgias.  Skin:  Negative for pallor and rash.       Skin lesoin   Allergic/Immunologic: Negative for environmental allergies.  Neurological:  Negative for dizziness, syncope and headaches.  Hematological:  Negative for adenopathy. Does not bruise/bleed easily.  Psychiatric/Behavioral:  Negative for decreased concentration and dysphoric mood. The patient is not nervous/anxious.        Objective:   Physical Exam Constitutional:      General: She is not in acute distress.    Appearance: Normal appearance. She is obese. She is not  ill-appearing or diaphoretic.  Eyes:     Conjunctiva/sclera: Conjunctivae normal.     Pupils: Pupils are equal, round, and reactive to light.  Cardiovascular:     Rate and Rhythm: Normal rate and regular rhythm.  Pulmonary:     Effort: Pulmonary effort is normal. No respiratory distress.  Musculoskeletal:     Cervical back: Neck supple.  Lymphadenopathy:     Cervical: No cervical adenopathy.  Skin:    Comments: 2-3 cm round area of erythema with slightly raised edge and central clearing on left calf  1 cm area inferior to this is more scaly appearing     Neurological:     Mental Status: She is alert.  Psychiatric:        Mood and Affect: Mood normal.           Assessment & Plan:   Problem List Items Addressed This Visit       Cardiovascular and Mediastinum   Hot flashes due to menopause   At night  Apprehensive to try over the counter meds due to possible med interactions Pt plans to reach out to her gyn about treatment options   ? If veozah or gabapentin  (has taken before) would help Is on cymbalta   Age over 69 - likely would not recommend HRT        Musculoskeletal and Integument   Skin lesion of left leg   This resembles tinea - by shape and central clearing  Differential also includes eczema (nummular) and insect bite (denies)  Will try ketoconazole  If not affective , would try topical steroid   Update if not starting to improve in a week or if worsening  Call back and Er precautions noted in detail today        Ringworm of body -  Primary   See a/p for skin lesion of leg      Relevant Medications   ketoconazole (NIZORAL) 2 % cream

## 2023-12-08 NOTE — Patient Instructions (Addendum)
 Try the ketoconazole cream to the leg spots daily for 1-2 weeks Update if not starting to improve in that time  or if worsening  We may consider another treatment if this does not work   Call your gyn about the hot flashes to discuss treatment options    Take care of yourself

## 2023-12-08 NOTE — Assessment & Plan Note (Signed)
 See a/p for skin lesion of leg

## 2023-12-08 NOTE — Assessment & Plan Note (Addendum)
 At night  Apprehensive to try over the counter meds due to possible med interactions Pt plans to reach out to her gyn about treatment options   ? If veozah or gabapentin  (has taken before) would help Is on cymbalta   Age over 69 - likely would not recommend HRT

## 2023-12-15 ENCOUNTER — Other Ambulatory Visit: Payer: Self-pay

## 2023-12-15 ENCOUNTER — Other Ambulatory Visit: Payer: Self-pay | Admitting: Family Medicine

## 2023-12-15 MED ORDER — TRAMADOL HCL 50 MG PO TABS
ORAL_TABLET | ORAL | 0 refills | Status: DC
  Filled 2023-12-15: qty 120, 30d supply, fill #0

## 2023-12-15 NOTE — Telephone Encounter (Signed)
 Name of Medication: Tramadol  Name of Pharmacy: [facility] Last Fill or Written Date and Quantity: 11/21/23 #120 tabs/ 0 refills  Last Office Visit and Type: 12/08/23 check leg (08/16/23 CPE) Next Office Visit and Type: none scheduled

## 2023-12-18 ENCOUNTER — Encounter: Payer: Self-pay | Admitting: Family Medicine

## 2023-12-19 ENCOUNTER — Other Ambulatory Visit: Payer: Self-pay

## 2023-12-19 MED ORDER — TRIAMCINOLONE ACETONIDE 0.1 % EX OINT
1.0000 | TOPICAL_OINTMENT | Freq: Two times a day (BID) | CUTANEOUS | 0 refills | Status: DC
  Filled 2023-12-19: qty 15, 30d supply, fill #0

## 2023-12-20 ENCOUNTER — Other Ambulatory Visit: Payer: Self-pay

## 2023-12-23 ENCOUNTER — Other Ambulatory Visit (HOSPITAL_COMMUNITY): Payer: Self-pay

## 2024-01-02 ENCOUNTER — Other Ambulatory Visit: Payer: Self-pay

## 2024-01-02 ENCOUNTER — Other Ambulatory Visit (HOSPITAL_COMMUNITY): Payer: Self-pay

## 2024-01-16 ENCOUNTER — Other Ambulatory Visit (HOSPITAL_COMMUNITY): Payer: Self-pay

## 2024-01-16 ENCOUNTER — Other Ambulatory Visit: Payer: Self-pay

## 2024-01-17 ENCOUNTER — Other Ambulatory Visit: Payer: Self-pay | Admitting: Family Medicine

## 2024-01-18 ENCOUNTER — Other Ambulatory Visit: Payer: Self-pay

## 2024-01-18 MED ORDER — TRAMADOL HCL 50 MG PO TABS
ORAL_TABLET | ORAL | 0 refills | Status: DC
  Filled 2024-01-18: qty 120, 30d supply, fill #0

## 2024-01-18 NOTE — Telephone Encounter (Signed)
 Name of Medication: Tramadol  Name of Pharmacy: [facility] Last Fill or Written Date and Quantity: 12/15/23 #120 tabs/ 0 refills  Last Office Visit and Type: 12/08/23 check leg (08/16/23 CPE) Next Office Visit and Type: none scheduled

## 2024-01-29 ENCOUNTER — Other Ambulatory Visit (HOSPITAL_COMMUNITY): Payer: Self-pay

## 2024-01-29 ENCOUNTER — Other Ambulatory Visit: Payer: Self-pay

## 2024-02-05 ENCOUNTER — Encounter (HOSPITAL_COMMUNITY): Payer: Self-pay

## 2024-02-05 ENCOUNTER — Other Ambulatory Visit: Payer: Self-pay

## 2024-02-05 ENCOUNTER — Other Ambulatory Visit (HOSPITAL_COMMUNITY): Payer: Self-pay

## 2024-02-06 ENCOUNTER — Other Ambulatory Visit (HOSPITAL_COMMUNITY): Payer: Self-pay

## 2024-02-12 ENCOUNTER — Telehealth: Payer: Self-pay | Admitting: Neurology

## 2024-02-12 NOTE — Telephone Encounter (Signed)
 Pt is requesting refill on  medication and also Pt is requesting for MD to up the dosage of this medication if possible  rizatriptan  (MAXALT -MLT) 5 MG disintegrating tablet  Pt would like medication sent to   [facility] (Ph: [phone])

## 2024-02-13 ENCOUNTER — Other Ambulatory Visit: Payer: Self-pay | Admitting: Family Medicine

## 2024-02-13 ENCOUNTER — Other Ambulatory Visit: Payer: Self-pay

## 2024-02-13 MED FILL — Baclofen Tab 10 MG: ORAL | 30 days supply | Qty: 60 | Fill #0 | Status: AC

## 2024-02-13 NOTE — Telephone Encounter (Signed)
 Name of Medication: Baclofen  Name of Pharmacy: [facility] Last Fill or Written Date and Quantity:07/10/23 #60 tab/ 3 refills  Last Office Visit and Type: 12/08/23 check leg (08/16/23 CPE) Next Office Visit and Type: none scheduled

## 2024-02-13 NOTE — Telephone Encounter (Signed)
 Name of Medication: Tramadol  Name of Pharmacy: [facility] Last Fill or Written Date and Quantity: 01/18/24 #120 tabs/ 0 refills  Last Office Visit and Type: 12/08/23 check leg (08/16/23 CPE) Next Office Visit and Type: none scheduled

## 2024-02-14 ENCOUNTER — Other Ambulatory Visit: Payer: Self-pay

## 2024-02-14 MED ORDER — RIZATRIPTAN BENZOATE 10 MG PO TBDP
ORAL_TABLET | ORAL | 11 refills | Status: DC
  Filled 2024-02-14 (×2): qty 12, 15d supply, fill #0

## 2024-02-14 NOTE — Addendum Note (Signed)
 Addended by: Yuette Putnam on: 02/14/2024 04:08 PM   Modules accepted: Orders

## 2024-02-15 ENCOUNTER — Other Ambulatory Visit: Payer: Self-pay

## 2024-02-15 MED ORDER — TRAMADOL HCL 50 MG PO TABS
ORAL_TABLET | ORAL | 0 refills | Status: DC
  Filled 2024-02-15: qty 120, 30d supply, fill #0

## 2024-02-19 ENCOUNTER — Other Ambulatory Visit: Payer: Self-pay

## 2024-02-19 ENCOUNTER — Encounter: Payer: Self-pay | Admitting: Internal Medicine

## 2024-02-19 ENCOUNTER — Inpatient Hospital Stay: Attending: Internal Medicine

## 2024-02-19 ENCOUNTER — Other Ambulatory Visit: Payer: Self-pay | Admitting: Internal Medicine

## 2024-02-19 DIAGNOSIS — D696 Thrombocytopenia, unspecified: Secondary | ICD-10-CM

## 2024-02-19 DIAGNOSIS — D693 Immune thrombocytopenic purpura: Secondary | ICD-10-CM | POA: Insufficient documentation

## 2024-02-19 LAB — CBC WITH DIFFERENTIAL (CANCER CENTER ONLY)
Abs Immature Granulocytes: 0.02 K/uL (ref 0.00–0.07)
Basophils Absolute: 0.1 K/uL (ref 0.0–0.1)
Basophils Relative: 1 %
Eosinophils Absolute: 0.2 K/uL (ref 0.0–0.5)
Eosinophils Relative: 4 %
HCT: 37.4 % (ref 36.0–46.0)
Hemoglobin: 12 g/dL (ref 12.0–15.0)
Immature Granulocytes: 0 %
Lymphocytes Relative: 34 %
Lymphs Abs: 2 K/uL (ref 0.7–4.0)
MCH: 28.4 pg (ref 26.0–34.0)
MCHC: 32.1 g/dL (ref 30.0–36.0)
MCV: 88.4 fL (ref 80.0–100.0)
Monocytes Absolute: 0.7 K/uL (ref 0.1–1.0)
Monocytes Relative: 12 %
Neutro Abs: 2.9 K/uL (ref 1.7–7.7)
Neutrophils Relative %: 49 %
Platelet Count: 90 K/uL — ABNORMAL LOW (ref 150–400)
RBC: 4.23 MIL/uL (ref 3.87–5.11)
RDW: 13.5 % (ref 11.5–15.5)
WBC Count: 5.9 K/uL (ref 4.0–10.5)
nRBC: 0 % (ref 0.0–0.2)

## 2024-02-20 ENCOUNTER — Other Ambulatory Visit: Payer: Self-pay

## 2024-02-20 ENCOUNTER — Other Ambulatory Visit: Payer: Self-pay | Admitting: Internal Medicine

## 2024-02-21 ENCOUNTER — Telehealth: Payer: Self-pay | Admitting: Internal Medicine

## 2024-02-21 ENCOUNTER — Other Ambulatory Visit: Payer: Self-pay

## 2024-02-21 MED ORDER — ALBUTEROL SULFATE HFA 108 (90 BASE) MCG/ACT IN AERS
2.0000 | INHALATION_SPRAY | Freq: Four times a day (QID) | RESPIRATORY_TRACT | 99 refills | Status: DC | PRN
  Filled 2024-02-21: qty 6.7, 25d supply, fill #0

## 2024-02-21 NOTE — Telephone Encounter (Signed)
 Albuterol  hfa Rx sent to [facility]

## 2024-02-21 NOTE — Telephone Encounter (Signed)
 Please see 8/20 encounter. Rx was sent, nothing further needed.

## 2024-02-21 NOTE — Telephone Encounter (Signed)
 Not on current med list.

## 2024-02-21 NOTE — Telephone Encounter (Signed)
 Pt notified. Nfn

## 2024-02-21 NOTE — Telephone Encounter (Signed)
 Copied from CRM #[id number]. Topic: Clinical - Medication Refill >> Feb 21, 2024 12:27 PM Devaughn RAMAN wrote: Medication: albuterol  (VENTOLIN  HFA) 108 (90 Base) MCG/ACT inhaler  Has the patient contacted their pharmacy? Yes (Agent: If no, request that the patient contact the pharmacy for the refill. If patient does not wish to contact the pharmacy document the reason why and proceed with request.) (Agent: If yes, when and what did the pharmacy advise?)  This is the patient's preferred pharmacy:  [facility] [address] [place] [address] Phone: [phone] Fax: [phone]  Is this the correct pharmacy for this prescription? Yes If no, delete pharmacy and type the correct one.   Has the prescription been filled recently? No  Is the patient out of the medication? Yes  Has the patient been seen for an appointment in the last year OR does the patient have an upcoming appointment? Yes  Can we respond through MyChart? Yes  Agent: Please be advised that Rx refills may take up to 3 business days. We ask that you follow-up with your pharmacy.

## 2024-02-21 NOTE — Telephone Encounter (Signed)
 Dr. Neysa, patient is requesting albuterol  refill. This is not on current med list. Can you please advise if okay to send in rx

## 2024-02-22 ENCOUNTER — Other Ambulatory Visit: Payer: Self-pay

## 2024-02-22 MED FILL — Albuterol Sulfate Inhal Aero 108 MCG/ACT (90MCG Base Equiv): RESPIRATORY_TRACT | 25 days supply | Qty: 6.7 | Fill #0 | Status: CN

## 2024-02-28 ENCOUNTER — Other Ambulatory Visit: Payer: Self-pay

## 2024-02-29 ENCOUNTER — Other Ambulatory Visit (HOSPITAL_COMMUNITY): Payer: Self-pay

## 2024-03-13 NOTE — Progress Notes (Unsigned)
 No chief complaint on file.     ASSESSMENT AND PLAN  Katherine Tanner is a 63 y.o. female   Chronic migraine headache  Rizatriptan  as needed  DIAGNOSTIC DATA (LABS, IMAGING, TESTING) - I reviewed patient records, labs, notes, testing and imaging myself where available.   MEDICAL HISTORY:  Katherine Tanner, is a 63 year old female seen in request by her primary care physician from [facility] Dr. Randeen, [facility], for evaluation of headaches  I reviewed and summarized the referring note. PMHX HLD HTN Asthma History of kidney stone Hx of bilateral knee pain Chronic low back pain.   She used to have migraine headache when she was younger, often presenting with visual aura, followed by lateralized pounding headache with light noise sensitivity, lasting for few hours, improved after 4 days  She is going through menopause over the past couple years, reported increased headaches since May 2024, couple times each months, retro-orbital area severe pounding headache with nausea vomiting, over-the-counter Tylenol  and Excedrin Migraine provide some help, headaches lasting for few hours also helped by sleep  Common trigger for headache, weather change, missing the meal  Laboratory June 2024 normal ESR C-reactive protein TSH  Personally reviewed MRI of the brain with without contrast December 22, 2022, mild small vessel disease most consistent with her long history of migraine  Update March 15, 2024 SS:    PHYSICAL EXAM:   There were no vitals filed for this visit.  Not recorded     There is no height or weight on file to calculate BMI.  PHYSICAL EXAMNIATION:  Gen: NAD, conversant, well nourised, well groomed                     Cardiovascular: Regular rate rhythm, no peripheral edema, warm, nontender. Eyes: Conjunctivae clear without exudates or hemorrhage Neck: Supple, no carotid bruits. Pulmonary: Clear to auscultation bilaterally   NEUROLOGICAL  EXAM:  MENTAL STATUS: Speech/cognition: Awake, alert, oriented to history taking and casual conversation CRANIAL NERVES: CN II: Visual fields are full to confrontation. Pupils are round equal and briskly reactive to light. CN III, IV, VI: extraocular movement are normal. No ptosis. CN V: Facial sensation is intact to light touch CN VII: Face is symmetric with normal eye closure  CN VIII: Hearing is normal to causal conversation. CN IX, X: Phonation is normal. CN XI: Head turning and shoulder shrug are intact  MOTOR: There is no pronator drift of out-stretched arms. Muscle bulk and tone are normal. Muscle strength is normal.  REFLEXES: Reflexes are 2+ and symmetric at the biceps, triceps, knees, and ankles. Plantar responses are flexor.  SENSORY: Intact to light touch, pinprick and vibratory sensation are intact in fingers and toes.  COORDINATION: There is no trunk or limb dysmetria noted.  GAIT/STANCE: Posture is normal. Gait is steady with normal steps, base, arm swing, and turning. Heel and toe walking are normal. Tandem gait is normal.  Romberg is absent.  REVIEW OF SYSTEMS:  Full 14 system review of systems performed and notable only for as above All other review of systems were negative.   ALLERGIES: Allergies  Allergen Reactions   Ace Inhibitors Shortness Of Breath and Cough    Cough/ breathing problems    Amoxicillin-Pot Clavulanate Swelling    SWELLING REACTION UNSPECIFIED    Influenza Virus Vacc Split Pf     UNSPECIFIED REACTION    Shellfish Allergy  Itching    HOME MEDICATIONS: Current Outpatient Medications  Medication Sig Dispense Refill  albuterol  (VENTOLIN  HFA) 108 (90 Base) MCG/ACT inhaler Inhale 2 puffs into the lungs every 6 (six) hours as needed for wheezing or shortness of breath 6.7 g 12   albuterol  (VENTOLIN  HFA) 108 (90 Base) MCG/ACT inhaler Inhale 2 puffs into the lungs every 6 (six) hours as needed for wheezing or shortness of breath. 8 g PRN    baclofen  (LIORESAL ) 10 MG tablet Take 1 tablet (10 mg total) by mouth 2 (two) times daily as needed for muscle spasms (pain). Caution of sedation 60 tablet 3   DULoxetine  (CYMBALTA ) 60 MG capsule Take 1 capsule (60 mg) by mouth at bedtime. 90 capsule 1   fluticasone -salmeterol (ADVAIR  DISKUS) 250-50 MCG/ACT AEPB Inhale 1 puff into the lungs 2 times daily then rinse mouth 60 each 10   ipratropium-albuterol  (DUONEB) 0.5-2.5 (3) MG/3ML SOLN Inhale 1 vial via nebulizer every 6 (six) hours as needed. 360 mL 12   loratadine  (CLARITIN ) 10 MG tablet Take 10 mg by mouth daily.     losartan -hydrochlorothiazide  (HYZAAR ) 100-25 MG tablet Take 1 tablet by mouth daily. 90 tablet 2   metFORMIN  (GLUCOPHAGE ) 500 MG tablet Take 1 tablet by mouth 2 times daily with meals. 180 tablet 3   montelukast  (SINGULAIR ) 10 MG tablet Take 1 tablet (10 mg total) by mouth at bedtime. 30 tablet 11   Multiple Vitamin (MULTIVITAMIN PO) Take 1 tablet by mouth daily.     Nebulizers (COMPRESSOR/NEBULIZER) MISC Use as directed 1 each 0   rizatriptan  (MAXALT -MLT) 10 MG disintegrating tablet Take 1 tablet (10 mg total) by mouth once as directed. May repeat in 2 hours if needed. 12 tablet 11   rosuvastatin  (CRESTOR ) 10 MG tablet Take 1 tablet (10 mg total) by mouth daily. 90 tablet 1   traMADol  (ULTRAM ) 50 MG tablet Take 1 tablet (50 mg total) by mouth in the morning AND 1 tablet (50 mg total) daily in the afternoon AND 2 tablets (100 mg total) at bedtime. 120 tablet 0   triamcinolone  ointment (KENALOG ) 0.1 % Apply 1 Application topically 2 (two) times daily to affected area 15 g 0   No current facility-administered medications for this visit.    PAST MEDICAL HISTORY: Past Medical History:  Diagnosis Date   Allergy  08/10/07   takes Claritin  daily and Flonase  daily   Arthritis    Asthma    uses Albuterol  daily as needed;Singulair  nightly;DUlera  daily   Back pain    Constipation    Fatty liver    Food allergy     GERD  (gastroesophageal reflux disease)    occasionally will take OTC meds but states she just watches what she eats   H/O hiatal hernia    History of bronchitis    last time 63yrs ago   History of colon polyps    History of kidney stones    History of shingles    Hyperlipidemia    Hypertension    takes Hyzaar  daily   Joint pain    Joint pain    Joint swelling    Obesity    Osteoarthritis    Pneumonia    hx of;last time at least 35yrs ago   PONV (postoperative nausea and vomiting)    Prediabetes    Shortness of breath    Sleep apnea    Swelling of lower extremity    Trouble in sleeping    Wheezing     PAST SURGICAL HISTORY: Past Surgical History:  Procedure Laterality Date   BREAST EXCISIONAL BIOPSY Right  BREAST SURGERY  1981   Rt breast lump removed   CARDIAC CATHETERIZATION     COLONOSCOPY     ESOPHAGOGASTRODUODENOSCOPY     JOINT REPLACEMENT  11/15/13   Rt knee . Lt knee 12/30/16   KNEE SURGERY Right    arthroscopy   Right ganglion cyst     x 2   right lumpectomy  around 1983   STERIOD INJECTION Left 11/15/2013   Procedure: Marcaine /STEROID INJECTION;  Surgeon: Norleen LITTIE Gavel, MD;  Location: [facility];  Service: Orthopedics;  Laterality: Left;   TOTAL KNEE ARTHROPLASTY Right 11/15/2013   Procedure: RIGHT TOTAL KNEE ARTHROPLASTY;  Surgeon: Norleen LITTIE Gavel, MD;  Location: [facility];  Service: Orthopedics;  Laterality: Right;   TOTAL KNEE ARTHROPLASTY Left 12/30/2016   Procedure: TOTAL KNEE ARTHROPLASTY;  Surgeon: Gavel Norleen, MD;  Location: [facility];  Service: Orthopedics;  Laterality: Left;   TUBAL LIGATION  07/1993    FAMILY HISTORY: Family History  Problem Relation Age of Onset   Diabetes Father    Cancer Father    Liver disease Father    Alcoholism Father    Alcohol abuse Father    Sarcoidosis Mother    Glaucoma Mother    Hypertension Mother    Hyperlipidemia Mother    Sleep apnea Mother    Obesity Mother    Vision loss Mother    Early death Maternal Grandmother     Heart disease Maternal Grandmother    Breast cancer Neg Hx     SOCIAL HISTORY: Social History   Socioeconomic History   Marital status: Married    Spouse name: Ozell   Number of children: 1   Years of education: Not on file   Highest education level: Some college, no degree  Occupational History   Occupation: Medical laboratory scientific officer: [employer]    Employer: [employer]  Tobacco Use   Smoking status: Never   Smokeless tobacco: Never  Vaping Use   Vaping status: Never Used  Substance and Sexual Activity   Alcohol use: No   Drug use: No   Sexual activity: Yes    Birth control/protection: Post-menopausal    Comment: Tubes tied  Other Topics Concern   Not on file  Social History Narrative   Lives in [place]; works at [employer].  No alcohol.   Social Drivers of Corporate investment banker Strain: Low Risk  (08/15/2023)   Overall Financial Resource Strain (CARDIA)    Difficulty of Paying Living Expenses: Not hard at all  Food Insecurity: No Food Insecurity (08/15/2023)   Hunger Vital Sign    Worried About Running Out of Food in the Last Year: Never true    Ran Out of Food in the Last Year: Never true  Transportation Needs: No Transportation Needs (08/15/2023)   PRAPARE - Administrator, Civil Service (Medical): No    Lack of Transportation (Non-Medical): No  Physical Activity: Sufficiently Active (08/15/2023)   Exercise Vital Sign    Days of Exercise per Week: 5 days    Minutes of Exercise per Session: 140 min  Stress: No Stress Concern Present (08/15/2023)   [employer] of Occupational Health - Occupational Stress Questionnaire    Feeling of Stress : Not at all  Social Connections: Socially Integrated (08/15/2023)   Social Connection and Isolation Panel    Frequency of Communication with Friends and Family: More than three times a week    Frequency of Social Gatherings with  Friends and Family: Twice a week    Attends Religious Services: More  than 4 times per year    Active Member of [employer] or Organizations: Yes    Attends Engineer, structural: More than 4 times per year    Marital Status: Married  Catering manager Violence: Not on file   Lauraine Born, SCHARLENE, DNP  [facility] [address] [place], [place] [address] [phone]

## 2024-03-15 ENCOUNTER — Other Ambulatory Visit: Payer: Self-pay | Admitting: Family Medicine

## 2024-03-15 ENCOUNTER — Ambulatory Visit (INDEPENDENT_AMBULATORY_CARE_PROVIDER_SITE_OTHER): Admitting: Neurology

## 2024-03-15 ENCOUNTER — Other Ambulatory Visit: Payer: Self-pay

## 2024-03-15 ENCOUNTER — Encounter: Payer: Self-pay | Admitting: Neurology

## 2024-03-15 VITALS — BP 125/81 | HR 82 | Ht 61.0 in | Wt 165.0 lb

## 2024-03-15 DIAGNOSIS — G43709 Chronic migraine without aura, not intractable, without status migrainosus: Secondary | ICD-10-CM | POA: Diagnosis not present

## 2024-03-15 MED ORDER — SUMATRIPTAN SUCCINATE 100 MG PO TABS
100.0000 mg | ORAL_TABLET | Freq: Once | ORAL | 3 refills | Status: AC | PRN
  Filled 2024-03-15: qty 9, 13d supply, fill #0
  Filled 2024-04-12: qty 9, 13d supply, fill #1
  Filled 2024-05-14: qty 9, 13d supply, fill #2

## 2024-03-15 MED ORDER — ONDANSETRON 4 MG PO TBDP
4.0000 mg | ORAL_TABLET | Freq: Three times a day (TID) | ORAL | 0 refills | Status: AC | PRN
Start: 2024-03-15 — End: ?
  Filled 2024-03-15: qty 20, 7d supply, fill #0

## 2024-03-15 MED FILL — Baclofen Tab 10 MG: ORAL | 30 days supply | Qty: 60 | Fill #1 | Status: AC

## 2024-03-15 NOTE — Patient Instructions (Signed)
 Great to meet you! Try Imitrex  at onset of migraine, may combine with Aleve , Zofran  Call for any problems or if Imitrex  is not effective.  We can try a different rescue medication.  Follow-up in 6 months via MyChart video visit.  Thanks!!

## 2024-03-16 ENCOUNTER — Other Ambulatory Visit: Payer: Self-pay

## 2024-03-18 ENCOUNTER — Other Ambulatory Visit: Payer: Self-pay

## 2024-03-18 MED ORDER — TRAMADOL HCL 50 MG PO TABS
ORAL_TABLET | ORAL | 0 refills | Status: DC
  Filled 2024-03-18: qty 120, 30d supply, fill #0

## 2024-03-18 NOTE — Telephone Encounter (Signed)
 Name of Medication:  Tramadol  Name of Pharmacy:  [facility] Last Fill or Written Date and Quantity:  02/16/24, #120/0 refills Last Office Visit and Type:  12/08/23, check leg; prior- 08/16/23, CPE Next Office Visit and Type:  none Last Controlled Substance Agreement Date:  none Last UDS:  none

## 2024-03-26 ENCOUNTER — Encounter: Payer: Self-pay | Admitting: Neurology

## 2024-03-27 ENCOUNTER — Other Ambulatory Visit: Payer: Self-pay

## 2024-03-27 ENCOUNTER — Other Ambulatory Visit (HOSPITAL_COMMUNITY): Payer: Self-pay

## 2024-04-01 ENCOUNTER — Other Ambulatory Visit: Payer: Self-pay | Admitting: Family Medicine

## 2024-04-01 DIAGNOSIS — R921 Mammographic calcification found on diagnostic imaging of breast: Secondary | ICD-10-CM

## 2024-04-11 ENCOUNTER — Other Ambulatory Visit: Payer: Self-pay | Admitting: Family Medicine

## 2024-04-11 NOTE — Telephone Encounter (Signed)
 Last filled on 10/16/23 #90 caps/ 1 refill   CPE scheduled 08/15/24

## 2024-04-12 ENCOUNTER — Other Ambulatory Visit (HOSPITAL_COMMUNITY): Payer: Self-pay

## 2024-04-12 MED ORDER — DULOXETINE HCL 60 MG PO CPEP
60.0000 mg | ORAL_CAPSULE | Freq: Every day | ORAL | 1 refills | Status: AC
  Filled 2024-04-12 – 2024-04-17 (×3): qty 90, 90d supply, fill #0
  Filled 2024-07-10: qty 90, 90d supply, fill #1

## 2024-04-17 ENCOUNTER — Other Ambulatory Visit: Payer: Self-pay

## 2024-04-17 ENCOUNTER — Other Ambulatory Visit: Payer: Self-pay | Admitting: Family Medicine

## 2024-04-17 ENCOUNTER — Other Ambulatory Visit (HOSPITAL_COMMUNITY): Payer: Self-pay

## 2024-04-17 DIAGNOSIS — G894 Chronic pain syndrome: Secondary | ICD-10-CM

## 2024-04-18 ENCOUNTER — Other Ambulatory Visit: Payer: Self-pay

## 2024-04-18 MED ORDER — TRAMADOL HCL 50 MG PO TABS
ORAL_TABLET | ORAL | 0 refills | Status: DC
  Filled 2024-04-18: qty 120, 30d supply, fill #0

## 2024-04-18 NOTE — Telephone Encounter (Signed)
 Name of Medication:  Tramadol  Name of Pharmacy:  [facility] Last Fill or Written Date and Quantity:  03/19/24, #120 Last Office Visit and Type:  12/08/23, leg check Next Office Visit and Type:  08/15/24, CPE Last Controlled Substance Agreement Date:  none Last UDS:  none

## 2024-04-19 ENCOUNTER — Other Ambulatory Visit: Payer: Self-pay

## 2024-04-19 MED FILL — Albuterol Sulfate Inhal Aero 108 MCG/ACT (90MCG Base Equiv): RESPIRATORY_TRACT | 25 days supply | Qty: 6.7 | Fill #0 | Status: AC

## 2024-05-01 ENCOUNTER — Ambulatory Visit: Payer: Self-pay | Admitting: Family Medicine

## 2024-05-01 ENCOUNTER — Ambulatory Visit (HOSPITAL_COMMUNITY)

## 2024-05-01 ENCOUNTER — Ambulatory Visit: Admitting: Family Medicine

## 2024-05-01 ENCOUNTER — Ambulatory Visit (INDEPENDENT_AMBULATORY_CARE_PROVIDER_SITE_OTHER)
Admission: RE | Admit: 2024-05-01 | Discharge: 2024-05-01 | Disposition: A | Discharge status: Home / Self Care | Source: Ambulatory Visit | Attending: Family Medicine | Admitting: Family Medicine

## 2024-05-01 ENCOUNTER — Encounter: Payer: Self-pay | Admitting: Family Medicine

## 2024-05-01 VITALS — BP 112/68 | HR 82 | Temp 99.1°F | Ht 61.0 in | Wt 168.0 lb

## 2024-05-01 DIAGNOSIS — M545 Low back pain, unspecified: Secondary | ICD-10-CM

## 2024-05-01 DIAGNOSIS — M25531 Pain in right wrist: Secondary | ICD-10-CM | POA: Insufficient documentation

## 2024-05-01 DIAGNOSIS — G8929 Other chronic pain: Secondary | ICD-10-CM

## 2024-05-01 DIAGNOSIS — M2550 Pain in unspecified joint: Secondary | ICD-10-CM

## 2024-05-01 DIAGNOSIS — M25561 Pain in right knee: Secondary | ICD-10-CM

## 2024-05-01 DIAGNOSIS — G894 Chronic pain syndrome: Secondary | ICD-10-CM

## 2024-05-01 DIAGNOSIS — M25562 Pain in left knee: Secondary | ICD-10-CM | POA: Diagnosis not present

## 2024-05-01 LAB — CBC WITH DIFFERENTIAL/PLATELET
Basophils Absolute: 0 K/uL (ref 0.0–0.1)
Basophils Relative: 0.9 % (ref 0.0–3.0)
Eosinophils Absolute: 0.1 K/uL (ref 0.0–0.7)
Eosinophils Relative: 2.5 % (ref 0.0–5.0)
HCT: 38.6 % (ref 36.0–46.0)
Hemoglobin: 12.6 g/dL (ref 12.0–15.0)
Lymphocytes Relative: 42.2 % (ref 12.0–46.0)
Lymphs Abs: 1.9 K/uL (ref 0.7–4.0)
MCHC: 32.7 g/dL (ref 30.0–36.0)
MCV: 87 fl (ref 78.0–100.0)
Monocytes Absolute: 0.6 K/uL (ref 0.1–1.0)
Monocytes Relative: 12.1 % — ABNORMAL HIGH (ref 3.0–12.0)
Neutro Abs: 1.9 K/uL (ref 1.4–7.7)
Neutrophils Relative %: 42.3 % — ABNORMAL LOW (ref 43.0–77.0)
Platelets: 119 K/uL — ABNORMAL LOW (ref 150.0–400.0)
RBC: 4.43 Mil/uL (ref 3.87–5.11)
RDW: 14.1 % (ref 11.5–15.5)
WBC: 4.5 K/uL (ref 4.0–10.5)

## 2024-05-01 LAB — SEDIMENTATION RATE: Sed Rate: 24 mm/h (ref 0–30)

## 2024-05-01 NOTE — Assessment & Plan Note (Signed)
 Continues tramadol  Utd refills Understands risk of sedation and habit

## 2024-05-01 NOTE — Assessment & Plan Note (Signed)
 Overall stable   Midline / lumbar due to facet deg change and also small discussed bulge L5-S1 Reviewed last mri and LS  Reviewed MRI report from 2023 Pending physical med and rehab notes from Dr Cozetta  Reviewed state database for prescription med    She takes  Tramadol  120 per month  Baclofen  30 per month

## 2024-05-01 NOTE — Progress Notes (Signed)
 Subjective:    Patient ID: Katherine Tanner, female    DOB: 1960/09/25, 63 y.o.   MRN: [id number]  HPI  Wt Readings from Last 3 Encounters:  05/01/24 168 lb (76.2 kg)  03/15/24 165 lb (74.8 kg)  12/08/23 164 lb 4 oz (74.5 kg)   31.74 kg/m  Vitals:   05/01/24 0924  BP: 112/68  Pulse: 82  Temp: 99.1 F (37.3 C)  SpO2: 99%   Pt presents for follow up of chronic pian   Tramadol  120 per month  50 am , 50 mid day and 120 evening  Baclofen  10 mg bid prn   Previously meloxicam -no longer   PDMP Tramadol  refill 10/17 for 120 pills / 30 days   Occational tylenol   Occational aleve     Joints hurt worse for past 2 weeks  No illness/no fevers   Hands are new  Right handed  Medial wrist on right  Top of hands more than fingers  Hands feel tight  No individual swollen joints   No trauma  No red or hot joints  No recent gout   Left ankle was hurting - that is better    Knees are stable , they ache Has not had orthopedic visit in years   Elbows and shoulders ar ok  Area between joints -fine  No new rashes    Lab Results  Component Value Date   ANA NEG 01/28/2010    Sister- has auto immune condition of eyes -takes methotrexate  Mother died /had sarcoidosis   Exercise  Walking on treadmill 40 minutes 5 d per week  No strength training currently   Lab Results  Component Value Date   WBC 4.5 05/01/2024   HGB 12.6 05/01/2024   HCT 38.6 05/01/2024   MCV 87.0 05/01/2024   PLT 119.0 (L) 05/01/2024   Avoids nsaids due to plt ct       Patient Active Problem List   Diagnosis Date Noted   Right wrist pain 05/01/2024   Ringworm of body 12/08/2023   Skin lesion of left leg 12/08/2023   Hot flashes due to menopause 12/08/2023   High serum vitamin D  08/16/2023   SI (sacroiliac) pain 03/29/2023   Chronic migraine w/o aura w/o status migrainosus, not intractable 01/11/2023   Abnormal MRI of head 12/22/2022   Menopause 07/25/2022   Chronic pain  syndrome 02/09/2022   Nonalcoholic hepatosteatosis 07/06/2020   At risk for heart disease 07/06/2020   Transaminitis 04/07/2020   Thrombocytopenia 07/08/2019   Vitamin D  deficiency 11/05/2018   Obstructive sleep apnea 10/10/2017   Primary osteoarthritis of left knee 12/30/2016   BMI 30.0-30.9,adult 05/24/2016   Prediabetes 02/24/2014   Osteoarthritis of right knee 11/15/2013   Osteoarthritis of left knee 11/15/2013   Alkaline phosphatase elevation 01/02/2012   Hyperlipidemia 01/02/2012   Routine general medical examination at a health care facility 12/09/2011   Allergy  to influenza vaccine 05/02/2011   Chronic low back pain 03/08/2011   POLYARTHRITIS 01/28/2010   Chronic pain of both knees 12/11/2009   Angioedema 03/27/2007   Essential hypertension 03/19/2007   Perennial allergic rhinitis with seasonal variation 03/19/2007   Asthma, moderate persistent 03/19/2007   GERD 03/19/2007   HIATAL HERNIA 03/19/2007   Past Medical History:  Diagnosis Date   Allergy  08/10/07   takes Claritin  daily and Flonase  daily   Arthritis    Asthma    uses Albuterol  daily as needed;Singulair  nightly;DUlera  daily   Back pain    Constipation  Fatty liver    Food allergy     GERD (gastroesophageal reflux disease)    occasionally will take OTC meds but states she just watches what she eats   H/O hiatal hernia    History of bronchitis    last time 16yrs ago   History of colon polyps    History of kidney stones    History of shingles    Hyperlipidemia    Hypertension    takes Hyzaar  daily   Joint pain    Joint pain    Joint swelling    Obesity    Osteoarthritis    Pneumonia    hx of;last time at least 85yrs ago   PONV (postoperative nausea and vomiting)    Prediabetes    Shortness of breath    Sleep apnea    Swelling of lower extremity    Trouble in sleeping    Wheezing    Past Surgical History:  Procedure Laterality Date   BREAST EXCISIONAL BIOPSY Right    BREAST SURGERY  1981    Rt breast lump removed   CARDIAC CATHETERIZATION     COLONOSCOPY     ESOPHAGOGASTRODUODENOSCOPY     JOINT REPLACEMENT  11/15/13   Rt knee . Lt knee 12/30/16   KNEE SURGERY Right    arthroscopy   Right ganglion cyst     x 2   right lumpectomy  around 1983   STERIOD INJECTION Left 11/15/2013   Procedure: Marcaine /STEROID INJECTION;  Surgeon: Norleen LITTIE Gavel, MD;  Location: [facility] OR;  Service: Orthopedics;  Laterality: Left;   TOTAL KNEE ARTHROPLASTY Right 11/15/2013   Procedure: RIGHT TOTAL KNEE ARTHROPLASTY;  Surgeon: Norleen LITTIE Gavel, MD;  Location: [facility] OR;  Service: Orthopedics;  Laterality: Right;   TOTAL KNEE ARTHROPLASTY Left 12/30/2016   Procedure: TOTAL KNEE ARTHROPLASTY;  Surgeon: Gavel Norleen, MD;  Location: [facility] OR;  Service: Orthopedics;  Laterality: Left;   TUBAL LIGATION  07/1993   Social History   Tobacco Use   Smoking status: Never   Smokeless tobacco: Never  Vaping Use   Vaping status: Never Used  Substance Use Topics   Alcohol use: No   Drug use: No   Family History  Problem Relation Age of Onset   Diabetes Father    Cancer Father    Liver disease Father    Alcoholism Father    Alcohol abuse Father    Sarcoidosis Mother    Glaucoma Mother    Hypertension Mother    Hyperlipidemia Mother    Sleep apnea Mother    Obesity Mother    Vision loss Mother    Early death Maternal Grandmother    Heart disease Maternal Grandmother    Breast cancer Neg Hx    Allergies  Allergen Reactions   Ace Inhibitors Shortness Of Breath and Cough    Cough/ breathing problems    Amoxicillin-Pot Clavulanate Swelling    SWELLING REACTION UNSPECIFIED    Influenza Virus Vacc Split Pf     UNSPECIFIED REACTION    Shellfish Allergy  Itching   Current Outpatient Medications on File Prior to Visit  Medication Sig Dispense Refill   albuterol  (VENTOLIN  HFA) 108 (90 Base) MCG/ACT inhaler Inhale 2 puffs into the lungs every 6 (six) hours as needed for wheezing or shortness of breath 6.7 g  12   albuterol  (VENTOLIN  HFA) 108 (90 Base) MCG/ACT inhaler Inhale 2 puffs into the lungs every 6 (six) hours as needed for wheezing or shortness of breath. 8 g  PRN   baclofen  (LIORESAL ) 10 MG tablet Take 1 tablet (10 mg total) by mouth 2 (two) times daily as needed for muscle spasms (pain). Caution of sedation 60 tablet 3   DULoxetine  (CYMBALTA ) 60 MG capsule Take 1 capsule (60 mg) by mouth at bedtime. 90 capsule 1   fluticasone -salmeterol (ADVAIR  DISKUS) 250-50 MCG/ACT AEPB Inhale 1 puff into the lungs 2 times daily then rinse mouth 60 each 10   ipratropium-albuterol  (DUONEB) 0.5-2.5 (3) MG/3ML SOLN Inhale 1 vial via nebulizer every 6 (six) hours as needed. 360 mL 12   loratadine  (CLARITIN ) 10 MG tablet Take 10 mg by mouth daily.     losartan -hydrochlorothiazide  (HYZAAR ) 100-25 MG tablet Take 1 tablet by mouth daily. 90 tablet 2   metFORMIN  (GLUCOPHAGE ) 500 MG tablet Take 1 tablet by mouth 2 times daily with meals. 180 tablet 3   montelukast  (SINGULAIR ) 10 MG tablet Take 1 tablet (10 mg total) by mouth at bedtime. 30 tablet 11   Multiple Vitamin (MULTIVITAMIN PO) Take 1 tablet by mouth daily.     Nebulizers (COMPRESSOR/NEBULIZER) MISC Use as directed 1 each 0   ondansetron  (ZOFRAN -ODT) 4 MG disintegrating tablet Take 1 tablet (4 mg total) by mouth every 8 (eight) hours as needed for nausea or vomiting. 20 tablet 0   rosuvastatin  (CRESTOR ) 10 MG tablet Take 1 tablet (10 mg total) by mouth daily. 90 tablet 1   SUMAtriptan  (IMITREX ) 100 MG tablet Take 1 tablet (100 mg total) by mouth once as needed for up to 1 dose for migraine. May repeat in 2 hours if headache persists or recurs. 10 tablet 3   traMADol  (ULTRAM ) 50 MG tablet Take 1 tablet (50 mg total) by mouth in the morning AND 1 tablet (50 mg total) daily in the afternoon AND 2 tablets (100 mg total) at bedtime. 120 tablet 0   No current facility-administered medications on file prior to visit.    Review of Systems  Constitutional:  Positive  for fatigue. Negative for activity change, appetite change, fever and unexpected weight change.  HENT:  Negative for congestion, ear pain, rhinorrhea, sinus pressure and sore throat.   Eyes:  Negative for pain, redness and visual disturbance.  Respiratory:  Negative for cough, shortness of breath and wheezing.   Cardiovascular:  Negative for chest pain and palpitations.  Gastrointestinal:  Negative for abdominal pain, blood in stool, constipation and diarrhea.  Endocrine: Negative for polydipsia and polyuria.  Genitourinary:  Negative for dysuria, frequency and urgency.  Musculoskeletal:  Positive for arthralgias and back pain. Negative for myalgias.  Skin:  Negative for pallor and rash.  Allergic/Immunologic: Negative for environmental allergies.  Neurological:  Negative for dizziness, syncope and headaches.  Hematological:  Negative for adenopathy. Does not bruise/bleed easily.  Psychiatric/Behavioral:  Negative for decreased concentration and dysphoric mood. The patient is not nervous/anxious.        Objective:   Physical Exam Constitutional:      General: She is not in acute distress.    Appearance: Normal appearance. She is well-developed. She is obese. She is not ill-appearing or diaphoretic.  HENT:     Head: Normocephalic and atraumatic.  Eyes:     Conjunctiva/sclera: Conjunctivae normal.     Pupils: Pupils are equal, round, and reactive to light.  Neck:     Thyroid : No thyromegaly.     Vascular: No carotid bruit or JVD.  Cardiovascular:     Rate and Rhythm: Normal rate and regular rhythm.     Heart  sounds: Normal heart sounds.     No gallop.  Pulmonary:     Effort: Pulmonary effort is normal. No respiratory distress.     Breath sounds: Normal breath sounds. No wheezing or rales.  Abdominal:     General: There is no distension or abdominal bruit.     Palpations: Abdomen is soft.  Musculoskeletal:     Cervical back: Normal range of motion and neck supple.     Right  lower leg: No edema.     Left lower leg: No edema.     Comments: No acute joint swelling/erythema or warmth  No joint deformity   Right wrist is mildly tender medially  Worse to flex and invert wrist  Also with pronation No crepitus  Normal perfusion and sensation of hand   Limited rom right knee   Lymphadenopathy:     Cervical: No cervical adenopathy.  Skin:    General: Skin is warm and dry.     Coloration: Skin is not pale.     Findings: No rash.  Neurological:     Mental Status: She is alert.     Cranial Nerves: No cranial nerve deficit.     Sensory: No sensory deficit.     Motor: No weakness.     Coordination: Coordination normal.     Deep Tendon Reflexes: Reflexes are normal and symmetric. Reflexes normal.  Psychiatric:        Mood and Affect: Mood normal.           Assessment & Plan:   Problem List Items Addressed This Visit       Other   Right wrist pain - Primary   Primarily medial wrist  No trauma or new activity or overuse  Is right handed  No neuro symptoms  No focal swelling   Has other joint pain (knees) Takes tramadol  No nsaid due to low plt ct   Xray today  Labs for auto immune joint dz  Reassuring exam Pending results for plan         Relevant Orders   DG Wrist Complete Right   POLYARTHRITIS   Knees and now wrists (worse on right)       Relevant Orders   Rheumatoid factor   ANA   Sedimentation Rate (Completed)   CBC with Differential (Completed)   Chronic pain syndrome   Continues tramadol   Utd refills Understands risk of sedation and habit      Chronic pain of both knees   Pt has had chronic pain in both knees since knee repl in 2015 and 2018 Right knee-is anterior Left knee is lateral   Per pt told but [facility] that nothing else could be done, also saw neuro  Then pain clinic (at [facility] also)=records pending  Overall stable -no better or worse  She takes  Tramadol  120 per month  Baclofen  30 per month No  nsaid due to low platelet         Relevant Orders   Rheumatoid factor   ANA   Sedimentation Rate (Completed)   CBC with Differential (Completed)   Chronic low back pain   Overall stable   Midline / lumbar due to facet deg change and also small discussed bulge L5-S1 Reviewed last mri and LS  Reviewed MRI report from 2023 Pending physical med and rehab notes from Dr Cozetta  Reviewed state database for prescription med    She takes  Tramadol  120 per month  Baclofen  30 per month

## 2024-05-01 NOTE — Assessment & Plan Note (Signed)
 Knees and now wrists (worse on right)

## 2024-05-01 NOTE — Assessment & Plan Note (Signed)
 Pt has had chronic pain in both knees since knee repl in 2015 and 2018 Right knee-is anterior Left knee is lateral   Per pt told but [facility] that nothing else could be done, also saw neuro  Then pain clinic (at [facility] also)=records pending  Overall stable -no better or worse  She takes  Tramadol  120 per month  Baclofen  30 per month No nsaid due to low platelet

## 2024-05-01 NOTE — Assessment & Plan Note (Addendum)
 Primarily medial wrist  No trauma or new activity or overuse  Is right handed  No neuro symptoms  No focal swelling   Has other joint pain (knees) Takes tramadol  No nsaid due to low plt ct   Xray today  Labs for auto immune joint dz  Reassuring exam Pending results for plan

## 2024-05-01 NOTE — Patient Instructions (Signed)
 Continue tramadol    Tylenol  as needed   Voltaren  gel is fine up to 4 times per day  Cold compress on right wrist when needed   Lab today  Xray today

## 2024-05-02 LAB — ANA: Anti Nuclear Antibody (ANA): NEGATIVE

## 2024-05-02 LAB — RHEUMATOID FACTOR: Rheumatoid fact SerPl-aCnc: <10 [IU]/mL (ref <14)

## 2024-05-08 ENCOUNTER — Other Ambulatory Visit (HOSPITAL_COMMUNITY): Payer: Self-pay

## 2024-05-08 ENCOUNTER — Other Ambulatory Visit: Payer: Self-pay

## 2024-05-14 ENCOUNTER — Other Ambulatory Visit: Payer: Self-pay | Admitting: Family Medicine

## 2024-05-14 DIAGNOSIS — G894 Chronic pain syndrome: Secondary | ICD-10-CM

## 2024-05-14 MED FILL — Baclofen Tab 10 MG: ORAL | 30 days supply | Qty: 60 | Fill #2 | Status: AC

## 2024-05-14 MED FILL — Albuterol Sulfate Inhal Aero 108 MCG/ACT (90MCG Base Equiv): RESPIRATORY_TRACT | 25 days supply | Qty: 6.7 | Fill #1 | Status: AC

## 2024-05-15 ENCOUNTER — Other Ambulatory Visit: Payer: Self-pay

## 2024-05-15 MED ORDER — ROSUVASTATIN CALCIUM 10 MG PO TABS
10.0000 mg | ORAL_TABLET | Freq: Every day | ORAL | 0 refills | Status: AC
  Filled 2024-05-15 – 2024-05-27 (×3): qty 90, 90d supply, fill #0

## 2024-05-15 NOTE — Telephone Encounter (Signed)
 Name of Medication:  Tramadol  Name of Pharmacy:  [facility] Last Fill or Written Date and Quantity:  04/18/24 #120 tab/ 0 refill Last Office Visit and Type:  05/01/24 med f/u Next Office Visit and Type:  08/15/24, CPE

## 2024-05-16 ENCOUNTER — Ambulatory Visit: Payer: Self-pay | Admitting: Family Medicine

## 2024-05-16 ENCOUNTER — Other Ambulatory Visit: Payer: Self-pay

## 2024-05-16 ENCOUNTER — Ambulatory Visit
Admission: RE | Admit: 2024-05-16 | Discharge: 2024-05-16 | Disposition: A | Discharge status: Home / Self Care | Source: Ambulatory Visit | Attending: Family Medicine | Admitting: Family Medicine

## 2024-05-16 DIAGNOSIS — R921 Mammographic calcification found on diagnostic imaging of breast: Secondary | ICD-10-CM

## 2024-05-16 MED ORDER — TRAMADOL HCL 50 MG PO TABS
ORAL_TABLET | ORAL | 0 refills | Status: DC
  Filled 2024-05-16 – 2024-05-19 (×2): qty 120, 30d supply, fill #0

## 2024-05-19 ENCOUNTER — Other Ambulatory Visit: Payer: Self-pay

## 2024-05-20 ENCOUNTER — Other Ambulatory Visit: Payer: Self-pay

## 2024-05-22 ENCOUNTER — Inpatient Hospital Stay (HOSPITAL_BASED_OUTPATIENT_CLINIC_OR_DEPARTMENT_OTHER): Admitting: Internal Medicine

## 2024-05-22 ENCOUNTER — Inpatient Hospital Stay: Attending: Internal Medicine

## 2024-05-22 ENCOUNTER — Encounter: Payer: Self-pay | Admitting: Internal Medicine

## 2024-05-22 VITALS — BP 147/86 | HR 75 | Temp 97.0°F | Resp 16 | Wt 166.5 lb

## 2024-05-22 DIAGNOSIS — Z79899 Other long term (current) drug therapy: Secondary | ICD-10-CM | POA: Diagnosis not present

## 2024-05-22 DIAGNOSIS — D696 Thrombocytopenia, unspecified: Secondary | ICD-10-CM | POA: Insufficient documentation

## 2024-05-22 DIAGNOSIS — Z79891 Long term (current) use of opiate analgesic: Secondary | ICD-10-CM | POA: Insufficient documentation

## 2024-05-22 LAB — BASIC METABOLIC PANEL - CANCER CENTER ONLY
Anion gap: 7 (ref 5–15)
BUN: 16 mg/dL (ref 8–23)
CO2: 28 mmol/L (ref 22–32)
Calcium: 9.3 mg/dL (ref 8.9–10.3)
Chloride: 103 mmol/L (ref 98–111)
Creatinine: 0.7 mg/dL (ref 0.44–1.00)
GFR, Estimated: >60 mL/min (ref >60)
Glucose, Bld: 96 mg/dL (ref 70–99)
Potassium: 4 mmol/L (ref 3.5–5.1)
Sodium: 138 mmol/L (ref 135–145)

## 2024-05-22 LAB — CBC WITH DIFFERENTIAL (CANCER CENTER ONLY)
Abs Immature Granulocytes: 0.01 K/uL (ref 0.00–0.07)
Basophils Absolute: 0 K/uL (ref 0.0–0.1)
Basophils Relative: 1 %
Eosinophils Absolute: 0.1 K/uL (ref 0.0–0.5)
Eosinophils Relative: 3 %
HCT: 37 % (ref 36.0–46.0)
Hemoglobin: 11.9 g/dL — ABNORMAL LOW (ref 12.0–15.0)
Immature Granulocytes: 0 %
Lymphocytes Relative: 38 %
Lymphs Abs: 2 K/uL (ref 0.7–4.0)
MCH: 28 pg (ref 26.0–34.0)
MCHC: 32.2 g/dL (ref 30.0–36.0)
MCV: 87.1 fL (ref 80.0–100.0)
Monocytes Absolute: 0.5 K/uL (ref 0.1–1.0)
Monocytes Relative: 10 %
Neutro Abs: 2.5 K/uL (ref 1.7–7.7)
Neutrophils Relative %: 48 %
Platelet Count: 90 K/uL — ABNORMAL LOW (ref 150–400)
RBC: 4.25 MIL/uL (ref 3.87–5.11)
RDW: 13.9 % (ref 11.5–15.5)
WBC Count: 5.2 K/uL (ref 4.0–10.5)
nRBC: 0 % (ref 0.0–0.2)

## 2024-05-22 NOTE — Progress Notes (Signed)
 Patient states she is doing well; no new concerns voiced at this time

## 2024-05-22 NOTE — Progress Notes (Signed)
 [facility] CONSULT NOTE  Patient Care Team: Tower, Laine LABOR, MD as PCP - General Rennie [name], MD as Consulting Physician (Internal Medicine)  CHIEF COMPLAINTS/PURPOSE OF CONSULTATION: Thrombocytopenia  # THROMBOCYTOPENIA: [since 2019 > 100]; 2023- 70-80s; Normal- WBC/platelets [Dr.Shadad; last May 2022]; HIV/hepatitis negative as per pt [[facility]- dx:PN]  #Intentional weight loss [~50 pounds over the last 1 year]  HISTORY OF PRESENTING ILLNESS: Ambulating independently.  Alone.  Katherine Tanner 63 y.o.  female patient with longstanding history of intermittent thrombocytopenia clinically ITP is here for follow-up.  Discussed the use of AI scribe software for clinical note transcription with the patient, who gave verbal consent to proceed.  History of Present Illness   Katherine Tanner is a 63 year old female with thrombocytopenia who presents for follow-up.  Her platelet counts have been fluctuating, with recent values ranging from 85 to 120, and currently at 90.  Her hemoglobin was recently checked and found to be slightly low at 11.9, but she has no complaints related to this finding.  No bleeding gums. No nose bleeds.  No blood in stools or black-colored stools. Patient denies any significant bleeding.   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 He is now doing well after rehabilitation, which she describes as a 'life database administrator'. She typically has blood work done once a year.      Review of Systems  Constitutional:  Positive for malaise/fatigue. Negative for chills, diaphoresis, fever and weight loss.  HENT:  Negative for nosebleeds and sore throat.   Eyes:  Negative for double vision.  Respiratory:  Negative for cough, hemoptysis, sputum production, shortness of breath and wheezing.   Cardiovascular:  Negative for chest pain, palpitations, orthopnea and leg swelling.  Gastrointestinal:  Negative for  abdominal pain, blood in stool, constipation, diarrhea, heartburn, melena, nausea and vomiting.  Genitourinary:  Negative for dysuria, frequency and urgency.  Musculoskeletal:  Positive for back pain and joint pain.  Skin: Negative.  Negative for itching and rash.  Neurological:  Negative for dizziness, tingling, focal weakness, weakness and headaches.  Endo/Heme/Allergies:  Does not bruise/bleed easily.  Psychiatric/Behavioral:  Negative for depression. The patient is not nervous/anxious and does not have insomnia.      MEDICAL HISTORY:  Past Medical History:  Diagnosis Date   Allergy  08/10/07   takes Claritin  daily and Flonase  daily   Arthritis    Asthma    uses Albuterol  daily as needed;Singulair  nightly;DUlera  daily   Back pain    Constipation    Fatty liver    Food allergy     GERD (gastroesophageal reflux disease)    occasionally will take OTC meds but states she just watches what she eats   H/O hiatal hernia    History of bronchitis    last time 57yrs ago   History of colon polyps    History of kidney stones    History of shingles    Hyperlipidemia    Hypertension    takes Hyzaar  daily   Joint pain    Joint pain    Joint swelling    Obesity    Osteoarthritis    Pneumonia    hx of;last time at least 39yrs ago   PONV (postoperative nausea and vomiting)    Prediabetes    Shortness of breath    Sleep apnea    Swelling of lower extremity    Trouble in sleeping    Wheezing     SURGICAL  HISTORY: Past Surgical History:  Procedure Laterality Date   BREAST EXCISIONAL BIOPSY Right    BREAST SURGERY  1981   Rt breast lump removed   CARDIAC CATHETERIZATION     COLONOSCOPY     ESOPHAGOGASTRODUODENOSCOPY     JOINT REPLACEMENT  11/15/13   Rt knee . Lt knee 12/30/16   KNEE SURGERY Right    arthroscopy   Right ganglion cyst     x 2   right lumpectomy  around 1983   STERIOD INJECTION Left 11/15/2013   Procedure: Marcaine /STEROID INJECTION;  Surgeon: Norleen LITTIE Gavel, MD;   Location: [facility] OR;  Service: Orthopedics;  Laterality: Left;   TOTAL KNEE ARTHROPLASTY Right 11/15/2013   Procedure: RIGHT TOTAL KNEE ARTHROPLASTY;  Surgeon: Norleen LITTIE Gavel, MD;  Location: [facility] OR;  Service: Orthopedics;  Laterality: Right;   TOTAL KNEE ARTHROPLASTY Left 12/30/2016   Procedure: TOTAL KNEE ARTHROPLASTY;  Surgeon: Gavel Norleen, MD;  Location: [facility] OR;  Service: Orthopedics;  Laterality: Left;   TUBAL LIGATION  07/1993    SOCIAL HISTORY: Social History   Socioeconomic History   Marital status: Married    Spouse name: Ozell   Number of children: 1   Years of education: Not on file   Highest education level: Some college, no degree  Occupational History   Occupation: Medical Laboratory Scientific Officer: [employer]    Employer: [employer]  Tobacco Use   Smoking status: Never   Smokeless tobacco: Never  Vaping Use   Vaping status: Never Used  Substance and Sexual Activity   Alcohol use: No   Drug use: No   Sexual activity: Yes    Birth control/protection: Post-menopausal    Comment: Tubes tied  Other Topics Concern   Not on file  Social History Narrative   Lives in [place]; works at [employer].  No alcohol.   Social Drivers of Corporate Investment Banker Strain: Low Risk  (04/30/2024)   Overall Financial Resource Strain (CARDIA)    Difficulty of Paying Living Expenses: Not hard at all  Food Insecurity: No Food Insecurity (04/30/2024)   Hunger Vital Sign    Worried About Running Out of Food in the Last Year: Never true    Ran Out of Food in the Last Year: Never true  Transportation Needs: No Transportation Needs (04/30/2024)   PRAPARE - Administrator, Civil Service (Medical): No    Lack of Transportation (Non-Medical): No  Physical Activity: Sufficiently Active (04/30/2024)   Exercise Vital Sign    Days of Exercise per Week: 5 days    Minutes of Exercise per Session: 40 min  Stress: No Stress Concern Present (04/30/2024)   [employer] of  Occupational Health - Occupational Stress Questionnaire    Feeling of Stress: Not at all  Social Connections: Socially Integrated (04/30/2024)   Social Connection and Isolation Panel    Frequency of Communication with Friends and Family: More than three times a week    Frequency of Social Gatherings with Friends and Family: Once a week    Attends Religious Services: More than 4 times per year    Active Member of [employer] or Organizations: Yes    Attends Engineer, Structural: More than 4 times per year    Marital Status: Married  Catering Manager Violence: Not on file    FAMILY HISTORY: Family History  Problem Relation Age of Onset   Diabetes Father    Cancer Father    Liver  disease Father    Alcoholism Father    Alcohol abuse Father    Sarcoidosis Mother    Glaucoma Mother    Hypertension Mother    Hyperlipidemia Mother    Sleep apnea Mother    Obesity Mother    Vision loss Mother    Early death Maternal Grandmother    Heart disease Maternal Grandmother    Breast cancer Neg Hx     ALLERGIES:  is allergic to ace inhibitors, amoxicillin-pot clavulanate, influenza virus vacc split pf, and shellfish allergy .  MEDICATIONS:  Current Outpatient Medications  Medication Sig Dispense Refill   albuterol  (VENTOLIN  HFA) 108 (90 Base) MCG/ACT inhaler Inhale 2 puffs into the lungs every 6 (six) hours as needed for wheezing or shortness of breath 6.7 g 12   albuterol  (VENTOLIN  HFA) 108 (90 Base) MCG/ACT inhaler Inhale 2 puffs into the lungs every 6 (six) hours as needed for wheezing or shortness of breath. 8 g PRN   baclofen  (LIORESAL ) 10 MG tablet Take 1 tablet (10 mg total) by mouth 2 (two) times daily as needed for muscle spasms (pain). Caution of sedation 60 tablet 3   DULoxetine  (CYMBALTA ) 60 MG capsule Take 1 capsule (60 mg) by mouth at bedtime. 90 capsule 1   fluticasone -salmeterol (ADVAIR  DISKUS) 250-50 MCG/ACT AEPB Inhale 1 puff into the lungs 2 times daily then rinse mouth  60 each 10   ipratropium-albuterol  (DUONEB) 0.5-2.5 (3) MG/3ML SOLN Inhale 1 vial via nebulizer every 6 (six) hours as needed. 360 mL 12   loratadine  (CLARITIN ) 10 MG tablet Take 10 mg by mouth daily.     losartan -hydrochlorothiazide  (HYZAAR ) 100-25 MG tablet Take 1 tablet by mouth daily. 90 tablet 2   metFORMIN  (GLUCOPHAGE ) 500 MG tablet Take 1 tablet by mouth 2 times daily with meals. 180 tablet 3   montelukast  (SINGULAIR ) 10 MG tablet Take 1 tablet (10 mg total) by mouth at bedtime. 30 tablet 11   Multiple Vitamin (MULTIVITAMIN PO) Take 1 tablet by mouth daily.     Nebulizers (COMPRESSOR/NEBULIZER) MISC Use as directed 1 each 0   ondansetron  (ZOFRAN -ODT) 4 MG disintegrating tablet Take 1 tablet (4 mg total) by mouth every 8 (eight) hours as needed for nausea or vomiting. 20 tablet 0   rosuvastatin  (CRESTOR ) 10 MG tablet Take 1 tablet (10 mg total) by mouth daily. 90 tablet 0   SUMAtriptan  (IMITREX ) 100 MG tablet Take 1 tablet (100 mg total) by mouth once as needed for up to 1 dose for migraine. May repeat in 2 hours if headache persists or recurs. 10 tablet 3   traMADol  (ULTRAM ) 50 MG tablet Take 1 tablet (50 mg total) by mouth in the morning AND 1 tablet (50 mg total) daily in the afternoon AND 2 tablets (100 mg total) at bedtime. 120 tablet 0   No current facility-administered medications for this visit.    PHYSICAL EXAMINATION:  Vitals:   05/22/24 1301 05/22/24 1304  BP: (!) 145/81 (!) 147/86  Pulse: 75   Resp: 16   Temp: (!) 97 F (36.1 C)   SpO2: 100%      Filed Weights   05/22/24 1301  Weight: 166 lb 8 oz (75.5 kg)      Physical Exam Vitals and nursing note reviewed.  HENT:     Head: Normocephalic and atraumatic.     Mouth/Throat:     Pharynx: Oropharynx is clear.  Eyes:     Extraocular Movements: Extraocular movements intact.     Pupils: Pupils  are equal, round, and reactive to light.  Cardiovascular:     Rate and Rhythm: Normal rate and regular rhythm.   Pulmonary:     Comments: Decreased breath sounds bilaterally.  Abdominal:     Palpations: Abdomen is soft.  Musculoskeletal:        General: Normal range of motion.     Cervical back: Normal range of motion.  Skin:    General: Skin is warm.  Neurological:     General: No focal deficit present.     Mental Status: She is alert and oriented to person, place, and time.  Psychiatric:        Behavior: Behavior normal.        Judgment: Judgment normal.      LABORATORY DATA:  I have reviewed the data as listed Lab Results  Component Value Date   WBC 5.2 05/22/2024   HGB 11.9 (L) 05/22/2024   HCT 37.0 05/22/2024   MCV 87.1 05/22/2024   PLT 90 (L) 05/22/2024   Recent Labs    08/16/23 0948 11/20/23 1252 05/22/24 1253  NA 142 137 138  K 4.0 4.2 4.0  CL 101 100 103  CO2 34* 27 28  GLUCOSE 108* 96 96  BUN 16 18 16   CREATININE 0.58 0.65 0.70  CALCIUM  9.6 9.3 9.3  GFRNONAA  --  >60 >60  PROT 7.0  --   --   ALBUMIN 4.4  --   --   AST 25  --   --   ALT 32  --   --   ALKPHOS 128*  --   --   BILITOT 0.6  --   --     RADIOGRAPHIC STUDIES: I have personally reviewed the radiological images as listed and agreed with the findings in the report. MM 3D DIAGNOSTIC MAMMOGRAM BILATERAL BREAST Result Date: 05/16/2024 CLINICAL DATA:  63 year old female here for final 2 year follow-up of probably benign calcifications in the left lateral breast EXAM: DIGITAL DIAGNOSTIC BILATERAL MAMMOGRAM WITH TOMOSYNTHESIS AND CAD TECHNIQUE: Bilateral digital diagnostic mammography and breast tomosynthesis was performed. The images were evaluated with computer-aided detection. COMPARISON:  Previous exam(s). ACR Breast Density Category b: There are scattered areas of fibroglandular density. FINDINGS: There are stable scattered benign calcifications seen in both breasts. Small grouped calcifications in the upper outer posterior left breast have been stable mammographically for 2 years and can be considered  benign. There are no new grouped microcalcifications, architectural distortion, masses other findings concerning for malignancy seen in either breast. IMPRESSION: Bilateral benign breast calcifications. RECOMMENDATION: Routine annual screening mammogram in 1 year. I have discussed the findings and recommendations with the patient. If applicable, a reminder letter will be sent to the patient regarding the next appointment. BI-RADS CATEGORY  2: Benign. Electronically Signed   By: Rosina Gelineau M.D.   On: 05/16/2024 11:19   DG Wrist Complete Right Result Date: 05/02/2024 EXAM: 3 OR MORE VIEW(S) XRAY OF THE WRIST 05/01/2024 10:15:03 AM COMPARISON: None available. CLINICAL HISTORY: pain. pain FINDINGS: BONES AND JOINTS: No acute fracture. No focal osseous lesion. No joint dislocation. SOFT TISSUES: The soft tissues are unremarkable. IMPRESSION: 1. No significant abnormality. 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   # >80- 100 patient feels asymptomatic;  Given stability of her platelets, I think it is reasonable to space out the blood checks to every 3 months.  However if patient notices anything abnormal/easy bruising or nosebleeds.  She will call us  sooner.  # DISPOSITION: # 3 month- labs-cbc # follow up in 6 months- MD; labs- cbc/bmp-- Dr.B  All questions were answered. The patient knows to call the clinic with any problems, questions or concerns.       Cindy JONELLE Joe, MD 05/22/2024 2:18 PM

## 2024-05-22 NOTE — Assessment & Plan Note (Signed)
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   # >80- 100 patient feels asymptomatic;  Given stability of her platelets, I think it is reasonable to space out the blood checks to every 3 months.  However if patient notices anything abnormal/easy bruising or nosebleeds.  She will call us  sooner.  # DISPOSITION: # 3 month- labs-cbc # follow up in 6 months- MD; labs- cbc/bmp-- Dr.B

## 2024-05-27 ENCOUNTER — Other Ambulatory Visit: Payer: Self-pay

## 2024-05-29 ENCOUNTER — Other Ambulatory Visit (HOSPITAL_COMMUNITY): Payer: Self-pay

## 2024-06-17 ENCOUNTER — Other Ambulatory Visit: Payer: Self-pay | Admitting: Neurology

## 2024-06-17 ENCOUNTER — Other Ambulatory Visit: Payer: Self-pay | Admitting: Family Medicine

## 2024-06-17 DIAGNOSIS — G894 Chronic pain syndrome: Secondary | ICD-10-CM

## 2024-06-17 MED FILL — Albuterol Sulfate Inhal Aero 108 MCG/ACT (90MCG Base Equiv): RESPIRATORY_TRACT | 25 days supply | Qty: 6.7 | Fill #2 | Status: AC

## 2024-06-18 ENCOUNTER — Other Ambulatory Visit: Payer: Self-pay

## 2024-06-18 MED ORDER — TRAMADOL HCL 50 MG PO TABS
ORAL_TABLET | ORAL | 0 refills | Status: DC
  Filled 2024-06-18: qty 55, 14d supply, fill #0
  Filled 2024-06-19: qty 65, 16d supply, fill #0

## 2024-06-18 NOTE — Telephone Encounter (Signed)
 Name of Medication:  Tramadol  Name of Pharmacy:  [facility] Last Fill or Written Date and Quantity:  05/16/24 #120 tab/ 0 refill Last Office Visit and Type:  05/01/24 med f/u Next Office Visit and Type:  08/15/24, CPE

## 2024-06-19 ENCOUNTER — Other Ambulatory Visit: Payer: Self-pay

## 2024-06-19 MED ORDER — ONDANSETRON 4 MG PO TBDP
4.0000 mg | ORAL_TABLET | Freq: Three times a day (TID) | ORAL | 2 refills | Status: AC | PRN
  Filled 2024-06-19: qty 20, 7d supply, fill #0

## 2024-06-19 NOTE — Telephone Encounter (Signed)
 Last seen on 03/15/24 Follow up scheduled 09/26/23

## 2024-07-10 ENCOUNTER — Encounter: Admitting: Internal Medicine

## 2024-07-10 ENCOUNTER — Other Ambulatory Visit: Payer: Self-pay

## 2024-07-15 ENCOUNTER — Encounter: Payer: Self-pay | Admitting: Certified Nurse Midwife

## 2024-07-15 ENCOUNTER — Ambulatory Visit (INDEPENDENT_AMBULATORY_CARE_PROVIDER_SITE_OTHER): Admitting: Certified Nurse Midwife

## 2024-07-15 VITALS — BP 137/73 | HR 85 | Ht 61.0 in | Wt 168.4 lb

## 2024-07-15 DIAGNOSIS — Z1231 Encounter for screening mammogram for malignant neoplasm of breast: Secondary | ICD-10-CM

## 2024-07-15 DIAGNOSIS — Z01419 Encounter for gynecological examination (general) (routine) without abnormal findings: Secondary | ICD-10-CM | POA: Diagnosis not present

## 2024-07-15 NOTE — Patient Instructions (Signed)
 Preventive Care 66-62 Years Old, Female Preventive care refers to lifestyle choices and visits with your health care provider that can promote health and wellness. Preventive care visits are also called wellness exams. What can I expect for my preventive care visit? Counseling Your health care provider may ask you questions about your: Medical history, including: Past medical problems. Family medical history. Pregnancy history. Current health, including: Menstrual cycle. Method of birth control. Emotional well-being. Home life and relationship well-being. Sexual activity and sexual health. Lifestyle, including: Alcohol, nicotine or tobacco, and drug use. Access to firearms. Diet, exercise, and sleep habits. Work and work Astronomer. Sunscreen use. Safety issues such as seatbelt and bike helmet use. Physical exam Your health care provider will check your: Height and weight. These may be used to calculate your BMI (body mass index). BMI is a measurement that tells if you are at a healthy weight. Waist circumference. This measures the distance around your waistline. This measurement also tells if you are at a healthy weight and may help predict your risk of certain diseases, such as type 2 diabetes and high blood pressure. Heart rate and blood pressure. Body temperature. Skin for abnormal spots. What immunizations do I need?  Vaccines are usually given at various ages, according to a schedule. Your health care provider will recommend vaccines for you based on your age, medical history, and lifestyle or other factors, such as travel or where you work. What tests do I need? Screening Your health care provider may recommend screening tests for certain conditions. This may include: Lipid and cholesterol levels. Diabetes screening. This is done by checking your blood sugar (glucose) after you have not eaten for a while (fasting). Pelvic exam and Pap test. Hepatitis B test. Hepatitis C  test. HIV (human immunodeficiency virus) test. STI (sexually transmitted infection) testing, if you are at risk. Lung cancer screening. Colorectal cancer screening. Mammogram. Talk with your health care provider about when you should start having regular mammograms. This may depend on whether you have a family history of breast cancer. BRCA-related cancer screening. This may be done if you have a family history of breast, ovarian, tubal, or peritoneal cancers. Bone density scan. This is done to screen for osteoporosis. Talk with your health care provider about your test results, treatment options, and if necessary, the need for more tests. Follow these instructions at home: Eating and drinking  Eat a diet that includes fresh fruits and vegetables, whole grains, lean protein, and low-fat dairy products. Take vitamin and mineral supplements as recommended by your health care provider. Do not drink alcohol if: Your health care provider tells you not to drink. You are pregnant, may be pregnant, or are planning to become pregnant. If you drink alcohol: Limit how much you have to 0-1 drink a day. Know how much alcohol is in your drink. In the [place] one drink equals one 12 oz bottle of beer (355 mL), one 5 oz glass of wine (148 mL), or one 1 oz glass of hard liquor (44 mL). Lifestyle Brush your teeth every morning and night with fluoride toothpaste. Floss one time each day. Exercise for at least 30 minutes 5 or more days each week. Do not use any products that contain nicotine or tobacco. These products include cigarettes, chewing tobacco, and vaping devices, such as e-cigarettes. If you need help quitting, ask your health care provider. Do not use drugs. If you are sexually active, practice safe sex. Use a condom or other form of protection to  prevent STIs. If you do not wish to become pregnant, use a form of birth control. If you plan to become pregnant, see your health care provider for a  prepregnancy visit. Take aspirin only as told by your health care provider. Make sure that you understand how much to take and what form to take. Work with your health care provider to find out whether it is safe and beneficial for you to take aspirin daily. Find healthy ways to manage stress, such as: Meditation, yoga, or listening to music. Journaling. Talking to a trusted person. Spending time with friends and family. Minimize exposure to UV radiation to reduce your risk of skin cancer. Safety Always wear your seat belt while driving or riding in a vehicle. Do not drive: If you have been drinking alcohol. Do not ride with someone who has been drinking. When you are tired or distracted. While texting. If you have been using any mind-altering substances or drugs. Wear a helmet and other protective equipment during sports activities. If you have firearms in your house, make sure you follow all gun safety procedures. Seek help if you have been physically or sexually abused. What's next? Visit your health care provider once a year for an annual wellness visit. Ask your health care provider how often you should have your eyes and teeth checked. Stay up to date on all vaccines. This information is not intended to replace advice given to you by your health care provider. Make sure you discuss any questions you have with your health care provider. Document Revised: 12/16/2020 Document Reviewed: 12/16/2020 Elsevier Patient Education  2024 Else[employer]. How to Do a Breast Self-Exam Doing breast self-exams can help you stay healthy. They're one way to know what's normal for your breasts. They can help you catch a problem while it's still small and can be treated. You need to: Check your breasts often. Tell your doctor about any changes. You should do breast self-exams even if you have breast implants. What you need: A mirror. A well-lit room. A pillow or other soft object. How to do a  breast self-exam Look for changes  Take off all the clothes above your waist. Stand in front of a mirror in a room with good lighting. Put your hands down at your sides. Compare your breasts in the mirror. Look for difference between them, such as: Differences in shape. Differences in size. Wrinkles, dips, and bumps in one breast and not the other. Look at each breast for skin changes, such as: Redness. Scaly spots. Spots where your skin is thicker. Dimpling. Open sores. Look for changes in your nipples, such as: Fluid coming out of a nipple. Fluid around a nipple. Bleeding. Dimpling. Redness. A nipple that looks pushed in or that has changed position. Feel for changes Lie on your back. Feel each breast. To do this: Pick a breast to feel. Place a pillow under the shoulder closest to that breast. Put the arm closest to that breast behind your head. Feel the breast using the hand of your other arm. Use the pads of your three middle fingers to make small circles starting near the nipple. Use light, medium, and firm pressure. Keep making circles, moving down over the breast. Stop when you feel your ribs. Start making circles with your fingers again, this time going up until you reach your collarbone. Then, make circles out across your breast and into your armpit area. Squeeze your nipple. Check for fluid and lumps. Do these steps again  to check your other breast. Sit or stand in the tub or shower. With soapy water on your skin, feel each breast the same way you did when you were lying down. Write down what you find Writing down what you find can help you keep track of what you want to tell your doctor. Write down: What's normal for each breast. Any changes you find. Write down: The kind of change. If your breast feels tender or painful. Any lump you find. Write down its size and where it is. When you last had your period. General tips If you're breastfeeding, the best time  to check your breasts is after you feed your baby or after you use a breast pump. If you get a period, the best time to check your breasts is 5-7 days after your period ends. With time, you'll get more used to doing the self-exam. You'll also start to know if there are changes in your breasts. Contact a doctor if: You see a change in the shape or size of your breasts or nipples. You see a change in the skin of your breast or nipples. You have fluid coming from your nipples that isn't normal. You find a new lump or thick area. You have breast pain. You have any concerns about your breast health. This information is not intended to replace advice given to you by your health care provider. Make sure you discuss any questions you have with your health care provider. Document Revised: 08/30/2023 Document Reviewed: 08/30/2023 Elsevier Patient Education  2025 [employer].

## 2024-07-15 NOTE — Progress Notes (Signed)
 "        GYNECOLOGY ANNUAL PREVENTATIVE CARE ENCOUNTER NOTE  History:     Katherine Tanner is a 64 y.o. [id number] female here for a routine annual gynecologic exam.  Current complaints: none. .   Denies abnormal vaginal bleeding, discharge, pelvic pain, problems with intercourse or other gynecologic concerns.     Social Relationship:married Living:husband Work:retired Exercise:5x a week for Smoke/Alcohol/drug ldz:Wzczm  Gynecologic History Patient's last menstrual period was 02/20/2014. Contraception: none Last Pap: 05/30/22. Results were: normal with negative HPV Last mammogram: 05/16/24. Results were: normal  Obstetric History OB History  Gravida Para Term Preterm AB Living  1 1 1   1   SAB IAB Ectopic Multiple Live Births          # Outcome Date GA Lbr Len/2nd Weight Sex Type Anes PTL Lv  1 Term             Past Medical History:  Diagnosis Date   Allergy  08/10/07   takes Claritin  daily and Flonase  daily   Arthritis    Asthma    uses Albuterol  daily as needed;Singulair  nightly;DUlera  daily   Back pain    Constipation    Fatty liver    Food allergy     GERD (gastroesophageal reflux disease)    occasionally will take OTC meds but states she just watches what she eats   H/O hiatal hernia    History of bronchitis    last time 27yrs ago   History of colon polyps    History of kidney stones    History of shingles    Hyperlipidemia    Hypertension    takes Hyzaar  daily   Joint pain    Joint pain    Joint swelling    Obesity    Osteoarthritis    Pneumonia    hx of;last time at least 55yrs ago   PONV (postoperative nausea and vomiting)    Prediabetes    Shortness of breath    Sleep apnea    Swelling of lower extremity    Trouble in sleeping    Wheezing     Past Surgical History:  Procedure Laterality Date   BREAST EXCISIONAL BIOPSY Right    BREAST SURGERY  1981   Rt breast lump removed   CARDIAC CATHETERIZATION     COLONOSCOPY      ESOPHAGOGASTRODUODENOSCOPY     JOINT REPLACEMENT  11/15/13   Rt knee . Lt knee 12/30/16   KNEE SURGERY Right    arthroscopy   Right ganglion cyst     x 2   right lumpectomy  around 1983   STERIOD INJECTION Left 11/15/2013   Procedure: Marcaine /STEROID INJECTION;  Surgeon: Norleen LITTIE Gavel, MD;  [facility];  Service: Orthopedics;  Laterality: Left;   TOTAL KNEE ARTHROPLASTY Right 11/15/2013   Procedure: RIGHT TOTAL KNEE ARTHROPLASTY;  Surgeon: Norleen LITTIE Gavel, MD;  Location[facility];  Service: Orthopedics;  Laterality: Right;   TOTAL KNEE ARTHROPLASTY Left 12/30/2016   Procedure: TOTAL KNEE ARTHROPLASTY;  Surgeon: Gavel Norleen, MD;  Location[facility];  Service: Orthopedics;  Laterality: Left;   TUBAL LIGATION  07/1993    Medications Ordered Prior to Encounter[1]  Allergies[2]  Social History:  reports that she has never smoked. She has never used smokeless tobacco. She reports that she does not drink alcohol and does not use drugs.  Family History  Problem Relation Age of Onset   Diabetes Father    Cancer Father  Liver disease Father    Alcoholism Father    Alcohol abuse Father    Sarcoidosis Mother    Glaucoma Mother    Hypertension Mother    Hyperlipidemia Mother    Sleep apnea Mother    Obesity Mother    Vision loss Mother    Early death Maternal Grandmother    Heart disease Maternal Grandmother    Breast cancer Neg Hx     The following portions of the patient's history were reviewed and updated as appropriate: allergies, current medications, past family history, past medical history, past social history, past surgical history and problem list.  Review of Systems Pertinent items noted in HPI and remainder of comprehensive ROS otherwise negative.  Physical Exam:  BP 137/73   Pulse 85   Ht 5' 1 (1.549 m)   Wt 168 lb 6.4 oz (76.4 kg)   LMP 02/20/2014   BMI 31.82 kg/m  CONSTITUTIONAL: Well-developed, well-nourished female in no acute distress.  HENT:   Normocephalic, atraumatic, External right and left ear normal. Oropharynx is clear and moist EYES: Conjunctivae and EOM are normal. Pupils are equal, round, and reactive to light. No scleral icterus.  NECK: Normal range of motion, supple, no masses.  Normal thyroid .  SKIN: Skin is warm and dry. No rash noted. Not diaphoretic. No erythema. No pallor. MUSCULOSKELETAL: Normal range of motion. No tenderness.  No cyanosis, clubbing, or edema.  2+ distal pulses. NEUROLOGIC: Alert and oriented to person, place, and time. Normal reflexes, muscle tone coordination.  PSYCHIATRIC: Normal mood and affect. Normal behavior. Normal judgment and thought content. CARDIOVASCULAR: Normal heart rate noted, regular rhythm RESPIRATORY: Clear to auscultation bilaterally. Effort and breath sounds normal, no problems with respiration noted. BREASTS: Symmetric in size. No masses, tenderness, skin changes, nipple drainage, or lymphadenopathy bilaterally.  ABDOMEN: Soft, no distention noted.  No tenderness, rebound or guarding.  PELVIC: Normal appearing external genitalia and urethral meatus; normal appearing vaginal mucosa and cervix.  No abnormal discharge noted.  Pap smear not due.  Normal uterine size, no other palpable masses, no uterine or adnexal tenderness.  .   Assessment and Plan:    Annual Well Women GYN Exam   Pap: not due until 2028 Mammogram : ordered Labs: none due  Refills: none  Referral: none Pt states she has gained some weight since being retired. Notes she exercises regularly but her overall activity has declined since she is no longer working. She state she is going to try to increase her at home exercise. Discussed wt loss medications as an adjunct. She declines at this time. Encouraged multivitamin and calcium  and vitamin D  supplement for bone health.  Routine preventative health maintenance measures emphasized. Please refer to After Visit Summary for other counseling recommendations.       [name], CNM [facility]  [facility],  [facility]      [1]  Current Outpatient Medications on File Prior to Visit  Medication Sig Dispense Refill   albuterol  (VENTOLIN  HFA) 108 (90 Base) MCG/ACT inhaler Inhale 2 puffs into the lungs every 6 (six) hours as needed for wheezing or shortness of breath 6.7 g 12   albuterol  (VENTOLIN  HFA) 108 (90 Base) MCG/ACT inhaler Inhale 2 puffs into the lungs every 6 (six) hours as needed for wheezing or shortness of breath. 8 g PRN   baclofen  (LIORESAL ) 10 MG tablet Take 1 tablet (10 mg total) by mouth 2 (two) times daily as needed for muscle spasms (pain). Caution  of sedation 60 tablet 3   DULoxetine  (CYMBALTA ) 60 MG capsule Take 1 capsule (60 mg) by mouth at bedtime. 90 capsule 1   fluticasone -salmeterol (ADVAIR  DISKUS) 250-50 MCG/ACT AEPB Inhale 1 puff into the lungs 2 times daily then rinse mouth 60 each 10   ipratropium-albuterol  (DUONEB) 0.5-2.5 (3) MG/3ML SOLN Inhale 1 vial via nebulizer every 6 (six) hours as needed. 360 mL 12   loratadine  (CLARITIN ) 10 MG tablet Take 10 mg by mouth daily.     losartan -hydrochlorothiazide  (HYZAAR ) 100-25 MG tablet Take 1 tablet by mouth daily. 90 tablet 2   metFORMIN  (GLUCOPHAGE ) 500 MG tablet Take 1 tablet by mouth 2 times daily with meals. 180 tablet 3   montelukast  (SINGULAIR ) 10 MG tablet Take 1 tablet (10 mg total) by mouth at bedtime. 30 tablet 11   Multiple Vitamin (MULTIVITAMIN PO) Take 1 tablet by mouth daily.     Nebulizers (COMPRESSOR/NEBULIZER) MISC Use as directed 1 each 0   ondansetron  (ZOFRAN -ODT) 4 MG disintegrating tablet Take 1 tablet (4 mg total) by mouth every 8 (eight) hours as needed for nausea or vomiting. 20 tablet 2   rosuvastatin  (CRESTOR ) 10 MG tablet Take 1 tablet (10 mg total) by mouth daily. 90 tablet 0   SUMAtriptan  (IMITREX ) 100 MG tablet Take 1 tablet (100 mg total) by mouth once as needed for up to 1 dose for migraine. May repeat in 2 hours  if headache persists or recurs. 10 tablet 3   traMADol  (ULTRAM ) 50 MG tablet Take 1 tablet (50 mg total) by mouth in the morning AND 1 tablet (50 mg total) daily in the afternoon AND 2 tablets (100 mg total) at bedtime. 120 tablet 0   No current facility-administered medications on file prior to visit.  [2]  Allergies Allergen Reactions   Ace Inhibitors Shortness Of Breath and Cough    Cough/ breathing problems    Amoxicillin-Pot Clavulanate Swelling    SWELLING REACTION UNSPECIFIED    Influenza Virus Vacc Split Pf     UNSPECIFIED REACTION    Shellfish Allergy  Itching   "

## 2024-07-18 ENCOUNTER — Other Ambulatory Visit: Payer: Self-pay

## 2024-07-18 ENCOUNTER — Ambulatory Visit

## 2024-07-18 ENCOUNTER — Other Ambulatory Visit: Payer: Self-pay | Admitting: Family Medicine

## 2024-07-18 VITALS — BP 146/84 | HR 75 | Temp 98.2°F | Ht 61.0 in | Wt 168.0 lb

## 2024-07-18 DIAGNOSIS — Z91013 Allergy to seafood: Secondary | ICD-10-CM | POA: Diagnosis not present

## 2024-07-18 DIAGNOSIS — J4540 Moderate persistent asthma, uncomplicated: Secondary | ICD-10-CM

## 2024-07-18 DIAGNOSIS — J309 Allergic rhinitis, unspecified: Secondary | ICD-10-CM | POA: Diagnosis not present

## 2024-07-18 DIAGNOSIS — G4733 Obstructive sleep apnea (adult) (pediatric): Secondary | ICD-10-CM

## 2024-07-18 DIAGNOSIS — G894 Chronic pain syndrome: Secondary | ICD-10-CM

## 2024-07-18 DIAGNOSIS — Z6831 Body mass index (BMI) 31.0-31.9, adult: Secondary | ICD-10-CM

## 2024-07-18 MED ORDER — FLUTICASONE PROPIONATE 50 MCG/ACT NA SUSP
2.0000 | Freq: Every day | NASAL | 2 refills | Status: AC
  Filled 2024-07-18: qty 18.2, 30d supply, fill #0

## 2024-07-18 MED ORDER — MONTELUKAST SODIUM 10 MG PO TABS
10.0000 mg | ORAL_TABLET | Freq: Every day | ORAL | 11 refills | Status: AC
  Filled 2024-07-18: qty 30, 30d supply, fill #0

## 2024-07-18 MED ORDER — ALBUTEROL SULFATE HFA 108 (90 BASE) MCG/ACT IN AERS
2.0000 | INHALATION_SPRAY | Freq: Four times a day (QID) | RESPIRATORY_TRACT | 99 refills | Status: AC | PRN
  Filled 2024-07-18: qty 6.7, 25d supply, fill #0

## 2024-07-18 MED ORDER — TRAMADOL HCL 50 MG PO TABS
ORAL_TABLET | ORAL | 0 refills | Status: AC
  Filled 2024-07-18: qty 120, 30d supply, fill #0

## 2024-07-18 MED ORDER — EPINEPHRINE 0.3 MG/0.3ML IJ SOAJ
0.3000 mg | INTRAMUSCULAR | 5 refills | Status: AC | PRN
  Filled 2024-07-18: qty 2, 30d supply, fill #0

## 2024-07-18 MED ORDER — FLUTICASONE-SALMETEROL 250-50 MCG/ACT IN AEPB
1.0000 | INHALATION_SPRAY | Freq: Two times a day (BID) | RESPIRATORY_TRACT | 10 refills | Status: AC
  Filled 2024-07-18: qty 60, 30d supply, fill #0

## 2024-07-18 NOTE — Assessment & Plan Note (Signed)
 Symptoms well controlled Continue the same treatment Orders:   fluticasone -salmeterol (ADVAIR  DISKUS) 250-50 MCG/ACT AEPB; Inhale 1 puff into the lungs 2 times daily then rinse mouth   albuterol  (VENTOLIN  HFA) 108 (90 Base) MCG/ACT inhaler; Inhale 2 puffs into the lungs every 6 (six) hours as needed for wheezing or shortness of breath.   montelukast  (SINGULAIR ) 10 MG tablet; Take 1 tablet (10 mg total) by mouth at bedtime.   DG Chest 2 View; Future   RESPIRATORY ALLERGY  PANEL REGION II W/ RFLX: [place]   IgE   Pulmonary function test; Future

## 2024-07-18 NOTE — Progress Notes (Signed)
 "  New Patient Pulmonology Office Visit   Subjective:  Patient ID: Katherine Tanner, female    DOB: April 02, 1961  MRN: [id number]  Referred by: Tower, Laine LABOR, MD  CC:  Chief Complaint  Patient presents with   Asthma    Somewhat controlled. Patient states cold weather makes breathing worse.    Used to follow with Dr Neysa for moderate persistent asthma, mild OSA (AHI 7).  She has mild sleep apnea diagnosed via a home sleep study in April 2019, with an AHI of 7.1. She has not used CPAP therapy. Since the study, she has lost weight, going from 193 pounds in 2019 to 168 pounds now in 2026 On epipen  for shellfish allergies She has a history of immune thrombocytopenic purpura (ITP) and is followed by a hematologist at [facility]. Used to work at [employer], retired now Initial visit with me 07/19/2023  Asthma Her past medical history is significant for asthma.   Katherine Tanner is a 64 y.o. female who is here for follow up.  Discussed the use of AI scribe software for clinical note transcription with the patient, who gave verbal consent to proceed.  History of Present Illness [name] is a 64 year old female with asthma who presents for follow-up regarding her asthma management. She was referred by Dr. Neysa for management of her asthma.  Her asthma is primarily triggered by environmental factors such as weather changes, smoke, colognes, and blooming plants. Her treatment regimen includes Advair , Singulair , Flonase , and albuterol  inhalers, which she uses regularly. She needs a refill for these medications. She has not required emergency care for asthma exacerbations and has a nebulizer at home with sufficient supplies. No recent emergency visits for asthma exacerbations are reported.  She has a history of allergic reactions, particularly to seafood such as shrimp and oysters, which have caused blistering in her mouth. She carries an EpiPen  for  emergencies and uses Benadryl  for milder reactions. She has not had a comprehensive food allergy  test recently, and her previous allergy  testing focused on environmental triggers.  She has mild sleep apnea diagnosed via a home sleep study in April 2019, with an AHI of 7.1. She has not used CPAP therapy. Since the study, she has lost weight, going from 193 pounds in 2019 to 168 pounds now. No symptoms of sleep apnea such as stopping breathing at night are reported.  She has a history of immune thrombocytopenic purpura (ITP) and is followed by a hematologist at [facility]. 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  She has experienced weight gain of about 10 pounds since retiring early last year due to her husband's stroke. She plans to increase her physical activity by using a treadmill to manage her weight.  She reports sinus congestion and drainage, particularly with weather changes, and uses over-the-counter allergy  medications and Flonase  or Nasonex  for relief.       ROS Review of symptoms negative except mentioned above  Allergies: Ace inhibitors, Amoxicillin-pot clavulanate, Influenza virus vacc split pf, and Shellfish allergy  Current Medications[1] Past Medical History:  Diagnosis Date   Allergy  08/10/07   takes Claritin  daily and Flonase  daily   Arthritis    Asthma    uses Albuterol  daily as needed;Singulair  nightly;DUlera  daily   Back pain    Constipation    Fatty liver    Food allergy     GERD (gastroesophageal reflux disease)    occasionally will take OTC  meds but states she just watches what she eats   H/O hiatal hernia    History of bronchitis    last time 24yrs ago   History of colon polyps    History of kidney stones    History of shingles    Hyperlipidemia    Hypertension    takes Hyzaar  daily   Joint pain    Joint pain    Joint swelling    Obesity    Osteoarthritis    Pneumonia    hx of;last time  at least 35yrs ago   PONV (postoperative nausea and vomiting)    Prediabetes    Shortness of breath    Sleep apnea    Swelling of lower extremity    Trouble in sleeping    Wheezing    Past Surgical History:  Procedure Laterality Date   BREAST EXCISIONAL BIOPSY Right    BREAST SURGERY  1981   Rt breast lump removed   CARDIAC CATHETERIZATION     COLONOSCOPY     ESOPHAGOGASTRODUODENOSCOPY     JOINT REPLACEMENT  11/15/13   Rt knee . Lt knee 12/30/16   KNEE SURGERY Right    arthroscopy   Right ganglion cyst     x 2   right lumpectomy  around 1983   STERIOD INJECTION Left 11/15/2013   Procedure: Marcaine /STEROID INJECTION;  Surgeon: Norleen LITTIE Gavel, MD;  Location: [facility];  Service: Orthopedics;  Laterality: Left;   TOTAL KNEE ARTHROPLASTY Right 11/15/2013   Procedure: RIGHT TOTAL KNEE ARTHROPLASTY;  Surgeon: Norleen LITTIE Gavel, MD;  Location: [facility];  Service: Orthopedics;  Laterality: Right;   TOTAL KNEE ARTHROPLASTY Left 12/30/2016   Procedure: TOTAL KNEE ARTHROPLASTY;  Surgeon: Gavel Norleen, MD;  [facility]: [facility];  Service: Orthopedics;  Laterality: Left;   TUBAL LIGATION  07/1993   Family History  Problem Relation Age of Onset   Diabetes Father    Cancer Father    Liver disease Father    Alcoholism Father    Alcohol abuse Father    Sarcoidosis Mother    Glaucoma Mother    Hypertension Mother    Hyperlipidemia Mother    Sleep apnea Mother    Obesity Mother    Vision loss Mother    Early death Maternal Grandmother    Heart disease Maternal Grandmother    Breast cancer Neg Hx    Social History   Socioeconomic History   Marital status: Married    Spouse name: Ozell   Number of children: 1   Years of education: Not on file   Highest education level: Some college, no degree  Occupational History   Occupation: Medical Laboratory Scientific Officer: [employer]    Employer: [employer]  Tobacco Use   Smoking status: Never   Smokeless tobacco: Never  Vaping Use   Vaping status:  Never Used  Substance and Sexual Activity   Alcohol use: No   Drug use: No   Sexual activity: Yes    Birth control/protection: Post-menopausal    Comment: Tubes tied  Other Topics Concern   Not on file  Social History Narrative   Lives in [place]; works at [employer].  No alcohol.   Social Drivers of Health   Tobacco Use: Low Risk (07/18/2024)   Patient History    Smoking Tobacco Use: Never    Smokeless Tobacco Use: Never    Passive Exposure: Not on file  Financial Resource Strain: Low Risk (04/30/2024)   Overall  Financial Resource Strain (CARDIA)    Difficulty of Paying Living Expenses: Not hard at all  Food Insecurity: No Food Insecurity (04/30/2024)   Epic    Worried About Programme Researcher, Broadcasting/film/video in the Last Year: Never true    Ran Out of Food in the Last Year: Never true  Transportation Needs: No Transportation Needs (04/30/2024)   Epic    Lack of Transportation (Medical): No    Lack of Transportation (Non-Medical): No  Physical Activity: Sufficiently Active (04/30/2024)   Exercise Vital Sign    Days of Exercise per Week: 5 days    Minutes of Exercise per Session: 40 min  Stress: No Stress Concern Present (04/30/2024)   [employer] of Occupational Health - Occupational Stress Questionnaire    Feeling of Stress: Not at all  Social Connections: Socially Integrated (04/30/2024)   Social Connection and Isolation Panel    Frequency of Communication with Friends and Family: More than three times a week    Frequency of Social Gatherings with Friends and Family: Once a week    Attends Religious Services: More than 4 times per year    Active Member of [employer] or Organizations: Yes    Attends Engineer, Structural: More than 4 times per year    Marital Status: Married  Catering Manager Violence: Not on file  Depression (PHQ2-9): Low Risk (05/22/2024)   Depression (PHQ2-9)    PHQ-2 Score: 0  Alcohol Screen: Not on file  Housing: Low Risk (04/30/2024)   Epic     Unable to Pay for Housing in the Last Year: No    Number of Times Moved in the Last Year: 0    Homeless in the Last Year: No  Utilities: Not on file  Health Literacy: Not on file         Objective:  BP (!) 146/84   Pulse 75   Temp 98.2 F (36.8 C) (Oral)   Ht 5' 1 (1.549 m)   Wt 168 lb (76.2 kg)   LMP 02/20/2014   SpO2 99%   BMI 31.74 kg/m    Physical Exam Constitutional:      General: She is not in acute distress.    Appearance: Normal appearance.  HENT:     Mouth/Throat:     Mouth: Mucous membranes are moist.  Cardiovascular:     Rate and Rhythm: Normal rate.  Pulmonary:     Effort: No respiratory distress.     Breath sounds: No wheezing or rales.  Musculoskeletal:     Right lower leg: No edema.     Left lower leg: No edema.  Skin:    General: Skin is warm.  Neurological:     Mental Status: She is alert and oriented to person, place, and time.  Psychiatric:        Mood and Affect: Mood normal.     Diagnostic Review:    Pft    Latest Ref Rng & Units 07/26/2021   12:40 PM 01/25/2018   12:16 PM 01/15/2018   12:55 PM  PFT Results  FVC-Pre L 2.34  2.44  2.37   FVC-Predicted Pre % 98  99  96   FVC-Post L 2.33  2.36  2.30   FVC-Predicted Post % 97  96  93   Pre FEV1/FVC % % 92  84  88   Post FEV1/FCV % % 93  84  93   FEV1-Pre L 2.14  2.05  2.08   FEV1-Predicted Pre % 114  105  107   FEV1-Post L 2.17  1.99  2.13   DLCO uncorrected ml/min/mmHg 20.76   21.09   DLCO UNC% % 112   104   DLCO corrected ml/min/mmHg 21.67     DLCO COR %Predicted % 117     DLVA Predicted % 141   128   TLC L 4.32   6.12   TLC % Predicted % 93   133   RV % Predicted % 101   217          Home sleep study (10/2017): Apnea-hypopnea index 7.1, mild obstructive sleep apnea  Results Diagnostic Pulmonary function testing reviewed        Assessment & Plan:   Assessment & Plan Moderate persistent asthma without complication Symptoms well controlled Continue the same  treatment Orders:   fluticasone -salmeterol (ADVAIR  DISKUS) 250-50 MCG/ACT AEPB; Inhale 1 puff into the lungs 2 times daily then rinse mouth   albuterol  (VENTOLIN  HFA) 108 (90 Base) MCG/ACT inhaler; Inhale 2 puffs into the lungs every 6 (six) hours as needed for wheezing or shortness of breath.   montelukast  (SINGULAIR ) 10 MG tablet; Take 1 tablet (10 mg total) by mouth at bedtime.   DG Chest 2 View; Future   RESPIRATORY ALLERGY  PANEL REGION II W/ RFLX: [place]   IgE   Pulmonary function test; Future  OSA (obstructive sleep apnea) Reviewed prior home sleep study Lost weight since then Advised weight loss with exercise and diet control  Orders:   RESPIRATORY ALLERGY  PANEL REGION II W/ RFLX: [place]   IgE  BMI 31.0-31.9,adult Advised weight loss with exercise and diet control     Shellfish allergy  Will check respi allergy  panel Orders:   EPINEPHrine  0.3 mg/0.3 mL IJ SOAJ injection; Inject 0.3 mg into the muscle as needed for anaphylaxis.  Chronic allergic rhinitis Flonase  and as needed antihistaminics     Wrote a handicap letter for the pt for DME; can't walk 266m due to weather/temperature changes worsening asthma symptoms  Thank you for the opportunity to take part in the care of Panayiota Tanner   Return in about 6 months (around 01/15/2025).   Samreen Seltzer Pleas, MD [facility] Office: [phone]     [1]  Current Outpatient Medications:    baclofen  (LIORESAL ) 10 MG tablet, Take 1 tablet (10 mg total) by mouth 2 (two) times daily as needed for muscle spasms (pain). Caution of sedation, Disp: 60 tablet, Rfl: 3   DULoxetine  (CYMBALTA ) 60 MG capsule, Take 1 capsule (60 mg) by mouth at bedtime., Disp: 90 capsule, Rfl: 1   fluticasone  (FLONASE ) 50 MCG/ACT nasal spray, Place 2 sprays into both nostrils daily., Disp: 18.2 mL, Rfl: 2   ipratropium-albuterol  (DUONEB) 0.5-2.5 (3) MG/3ML SOLN, Inhale 1 vial via nebulizer every 6 (six) hours  as needed., Disp: 360 mL, Rfl: 12   loratadine  (CLARITIN ) 10 MG tablet, Take 10 mg by mouth daily., Disp: , Rfl:    losartan -hydrochlorothiazide  (HYZAAR ) 100-25 MG tablet, Take 1 tablet by mouth daily., Disp: 90 tablet, Rfl: 2   metFORMIN  (GLUCOPHAGE ) 500 MG tablet, Take 1 tablet by mouth 2 times daily with meals., Disp: 180 tablet, Rfl: 3   Multiple Vitamin (MULTIVITAMIN PO), Take 1 tablet by mouth daily., Disp: , Rfl:    Nebulizers (COMPRESSOR/NEBULIZER) MISC, Use as directed, Disp: 1 each, Rfl: 0   ondansetron  (ZOFRAN -ODT) 4 MG disintegrating tablet, Take 1 tablet (4 mg total) by mouth every 8 (eight)  hours as needed for nausea or vomiting., Disp: 20 tablet, Rfl: 2   rosuvastatin  (CRESTOR ) 10 MG tablet, Take 1 tablet (10 mg total) by mouth daily., Disp: 90 tablet, Rfl: 0   SUMAtriptan  (IMITREX ) 100 MG tablet, Take 1 tablet (100 mg total) by mouth once as needed for up to 1 dose for migraine. May repeat in 2 hours if headache persists or recurs., Disp: 10 tablet, Rfl: 3   traMADol  (ULTRAM ) 50 MG tablet, Take 1 tablet (50 mg total) by mouth in the morning AND 1 tablet (50 mg total) daily in the afternoon AND 2 tablets (100 mg total) at bedtime., Disp: 120 tablet, Rfl: 0   albuterol  (VENTOLIN  HFA) 108 (90 Base) MCG/ACT inhaler, Inhale 2 puffs into the lungs every 6 (six) hours as needed for wheezing or shortness of breath., Disp: 6.7 g, Rfl: PRN   EPINEPHrine  0.3 mg/0.3 mL IJ SOAJ injection, Inject 0.3 mg into the muscle as needed for anaphylaxis., Disp: 1 each, Rfl: 5   fluticasone -salmeterol (ADVAIR  DISKUS) 250-50 MCG/ACT AEPB, Inhale 1 puff into the lungs 2 times daily then rinse mouth, Disp: 60 each, Rfl: 10   montelukast  (SINGULAIR ) 10 MG tablet, Take 1 tablet (10 mg total) by mouth at bedtime., Disp: 30 tablet, Rfl: 11  "

## 2024-07-18 NOTE — Patient Instructions (Addendum)
" °  °  Contains text generated by Abridge.   VISIT SUMMARY: Today, you had a follow-up appointment to manage your asthma. We discussed your asthma triggers, current medications, and recent health changes. We also reviewed your allergic rhinitis and shellfish allergy  management.  YOUR PLAN: MODERATE PERSISTENT ASTHMA: Your asthma is well-controlled with your current medications, but it is triggered by environmental factors. -Refilled Advair , albuterol , and Singulair  prescriptions. -Ordered an updated breathing test before your next visit. -Ordered a chest X-ray to rule out any new issues.  CHRONIC ALLERGIC RHINITIS: You experience sinus congestion and drainage, especially with weather changes. -Ordered blood tests for allergies to update your records.  SHELLFISH ALLERGY : You have an allergy  to shellfish, particularly shrimp and oysters, which causes oral blisters. -Refilled your EpiPen  prescription.   Contains text generated by Abridge.   "

## 2024-07-18 NOTE — Telephone Encounter (Signed)
 Name of Medication:  Tramadol  Name of Pharmacy:  [facility] Last Fill or Written Date and Quantity:  06/18/24 #120 tab/ 0 refill Last Office Visit and Type:  05/01/24 med f/u Next Office Visit and Type:  08/15/24, CPE

## 2024-07-22 LAB — RESPIRATORY ALLERGY PANEL REGION II W/ RFLX: ~~LOC~~
Allergen, A. alternata, m6: <0.1 kU/L
Allergen, Cedar tree, t12: <0.1 kU/L
Allergen, Comm Silver Birch, t9: <0.1 kU/L
Allergen, Cottonwood, t14: 0.1 kU/L — ABNORMAL HIGH
Allergen, D pternoyssinus,d7: 0.13 kU/L — ABNORMAL HIGH
Allergen, Mouse Urine Protein, e78: <0.1 kU/L
Allergen, Mulberry, t76: <0.1 kU/L
Allergen, Oak,t7: <0.1 kU/L
Allergen, P. notatum, m1: <0.1 kU/L
Aspergillus fumigatus, m3: 0.19 kU/L — ABNORMAL HIGH
Bermuda Grass: <0.1 kU/L
Box Elder IgE: 0.29 kU/L — ABNORMAL HIGH
CLADOSPORIUM HERBARUM (M2) IGE: <0.1 kU/L
COMMON RAGWEED (SHORT) (W1) IGE: 2 kU/L — ABNORMAL HIGH
Cat Dander: <0.1 kU/L
Class: 0
Class: 0
Class: 0
Class: 0
Class: 0
Class: 0
Class: 0
Class: 0
Class: 0
Class: 0
Class: 0
Class: 0
Class: 0
Class: 0
Class: 0
Class: 0
Class: 0
Class: 1
Class: 2
Class: 2
Cockroach: <0.1 kU/L
D. farinae: <0.1 kU/L
Dog Dander: <0.1 kU/L
Elm IgE: 0.44 kU/L — ABNORMAL HIGH
IgE (Immunoglobulin E), Serum: 66 kU/L
Johnson Grass: <0.1 kU/L
Pecan/Hickory Tree IgE: 0.77 kU/L — ABNORMAL HIGH
Rough Pigweed  IgE: <0.1 kU/L
Sheep Sorrel IgE: <0.1 kU/L
Timothy Grass: <0.1 kU/L

## 2024-07-22 LAB — INTERPRETATION:

## 2024-07-25 ENCOUNTER — Ambulatory Visit: Payer: Self-pay

## 2024-07-30 ENCOUNTER — Other Ambulatory Visit: Payer: Self-pay

## 2024-07-30 ENCOUNTER — Other Ambulatory Visit: Payer: Self-pay | Admitting: Family Medicine

## 2024-07-30 MED ORDER — LOSARTAN POTASSIUM-HCTZ 100-25 MG PO TABS
1.0000 | ORAL_TABLET | Freq: Every day | ORAL | 0 refills | Status: AC
  Filled 2024-07-30: qty 90, 90d supply, fill #0

## 2024-08-15 ENCOUNTER — Encounter: Admitting: Family Medicine

## 2024-08-22 ENCOUNTER — Inpatient Hospital Stay

## 2024-09-25 ENCOUNTER — Telehealth: Admitting: Neurology

## 2024-11-20 ENCOUNTER — Inpatient Hospital Stay

## 2024-11-20 ENCOUNTER — Inpatient Hospital Stay: Admitting: Internal Medicine
